# Patient Record
Sex: Female | Born: 1985 | Race: White | Hispanic: No | Marital: Single | Smoking: Current every day smoker
Health system: Southern US, Community
[De-identification: ages and names within clinical notes are randomized; demographics above are authoritative.]

## PROBLEM LIST (undated history)

## (undated) DIAGNOSIS — M797 Fibromyalgia: Secondary | ICD-10-CM

## (undated) DIAGNOSIS — F329 Major depressive disorder, single episode, unspecified: Secondary | ICD-10-CM

## (undated) DIAGNOSIS — F1920 Other psychoactive substance dependence, uncomplicated: Secondary | ICD-10-CM

## (undated) DIAGNOSIS — F1910 Other psychoactive substance abuse, uncomplicated: Secondary | ICD-10-CM

## (undated) DIAGNOSIS — K56609 Unspecified intestinal obstruction, unspecified as to partial versus complete obstruction: Secondary | ICD-10-CM

## (undated) DIAGNOSIS — N200 Calculus of kidney: Secondary | ICD-10-CM

## (undated) DIAGNOSIS — M5124 Other intervertebral disc displacement, thoracic region: Secondary | ICD-10-CM

## (undated) DIAGNOSIS — M5126 Other intervertebral disc displacement, lumbar region: Secondary | ICD-10-CM

## (undated) DIAGNOSIS — F32A Depression, unspecified: Secondary | ICD-10-CM

## (undated) DIAGNOSIS — M069 Rheumatoid arthritis, unspecified: Secondary | ICD-10-CM

## (undated) DIAGNOSIS — M810 Age-related osteoporosis without current pathological fracture: Secondary | ICD-10-CM

## (undated) DIAGNOSIS — K5792 Diverticulitis of intestine, part unspecified, without perforation or abscess without bleeding: Secondary | ICD-10-CM

## (undated) DIAGNOSIS — G40909 Epilepsy, unspecified, not intractable, without status epilepticus: Secondary | ICD-10-CM

## (undated) DIAGNOSIS — K2950 Unspecified chronic gastritis without bleeding: Secondary | ICD-10-CM

## (undated) DIAGNOSIS — G8929 Other chronic pain: Secondary | ICD-10-CM

## (undated) DIAGNOSIS — F1110 Opioid abuse, uncomplicated: Secondary | ICD-10-CM

## (undated) DIAGNOSIS — G4700 Insomnia, unspecified: Secondary | ICD-10-CM

## (undated) DIAGNOSIS — J302 Other seasonal allergic rhinitis: Secondary | ICD-10-CM

## (undated) DIAGNOSIS — J449 Chronic obstructive pulmonary disease, unspecified: Secondary | ICD-10-CM

## (undated) DIAGNOSIS — K219 Gastro-esophageal reflux disease without esophagitis: Secondary | ICD-10-CM

## (undated) DIAGNOSIS — R109 Unspecified abdominal pain: Secondary | ICD-10-CM

## (undated) DIAGNOSIS — F319 Bipolar disorder, unspecified: Secondary | ICD-10-CM

## (undated) DIAGNOSIS — F419 Anxiety disorder, unspecified: Secondary | ICD-10-CM

## (undated) DIAGNOSIS — K5989 Other specified functional intestinal disorders: Secondary | ICD-10-CM

## (undated) HISTORY — PX: CHOLECYSTECTOMY: SHX55

## (undated) HISTORY — PX: OTHER SURGICAL HISTORY: SHX169

## (undated) HISTORY — DX: Gastro-esophageal reflux disease without esophagitis: K21.9

## (undated) HISTORY — DX: Fibromyalgia: M79.7

## (undated) HISTORY — PX: APPENDECTOMY (OPEN): SHX54

## (undated) HISTORY — PX: OVARIAN CYST SURGERY: SHX726

## (undated) HISTORY — DX: Other specified functional intestinal disorders: K59.89

## (undated) HISTORY — DX: Other seasonal allergic rhinitis: J30.2

## (undated) HISTORY — DX: Other chronic pain: G89.29

## (undated) HISTORY — DX: Unspecified abdominal pain: R10.9

## (undated) HISTORY — DX: Anxiety disorder, unspecified: F41.9

## (undated) HISTORY — DX: Chronic obstructive pulmonary disease, unspecified: J44.9

## (undated) HISTORY — DX: Bipolar disorder, unspecified: F31.9

## (undated) HISTORY — DX: Insomnia, unspecified: G47.00

## (undated) HISTORY — PX: SALPINGECTOMY, OPEN: SHX5529

## (undated) HISTORY — DX: Opioid abuse, uncomplicated: F11.10

---

## 2007-10-22 ENCOUNTER — Emergency Department
Admission: EM | Admit: 2007-10-22 | Disposition: A | Discharge status: Home / Self Care | Payer: Self-pay | Source: Ambulatory Visit

## 2007-10-24 ENCOUNTER — Emergency Department
Admission: EM | Admit: 2007-10-24 | Discharge status: Against Medical Advice | Payer: Self-pay | Source: Ambulatory Visit

## 2007-10-29 ENCOUNTER — Emergency Department
Admission: EM | Admit: 2007-10-29 | Disposition: A | Discharge status: Home / Self Care | Payer: Self-pay | Source: Ambulatory Visit

## 2007-11-03 ENCOUNTER — Emergency Department
Admission: EM | Admit: 2007-11-03 | Disposition: A | Discharge status: Home / Self Care | Payer: Self-pay | Source: Ambulatory Visit

## 2007-11-18 ENCOUNTER — Ambulatory Visit
Admission: RE | Admit: 2007-11-18 | Disposition: A | Discharge status: Home / Self Care | Payer: Self-pay | Source: Ambulatory Visit

## 2007-11-21 ENCOUNTER — Ambulatory Visit
Admission: RE | Admit: 2007-11-21 | Disposition: A | Discharge status: Home / Self Care | Payer: Self-pay | Source: Ambulatory Visit

## 2007-12-06 ENCOUNTER — Emergency Department
Admission: EM | Admit: 2007-12-06 | Disposition: A | Discharge status: Home / Self Care | Payer: Self-pay | Source: Ambulatory Visit

## 2007-12-19 ENCOUNTER — Emergency Department
Admission: EM | Admit: 2007-12-19 | Discharge status: Against Medical Advice | Payer: Self-pay | Source: Ambulatory Visit

## 2007-12-23 ENCOUNTER — Ambulatory Visit
Admission: RE | Admit: 2007-12-23 | Disposition: A | Discharge status: Home / Self Care | Payer: Self-pay | Source: Ambulatory Visit

## 2008-01-31 ENCOUNTER — Ambulatory Visit
Admission: RE | Admit: 2008-01-31 | Disposition: A | Discharge status: Home / Self Care | Payer: Self-pay | Source: Ambulatory Visit

## 2008-02-03 ENCOUNTER — Emergency Department
Admission: EM | Admit: 2008-02-03 | Disposition: A | Discharge status: Home / Self Care | Payer: Self-pay | Source: Ambulatory Visit

## 2008-02-12 ENCOUNTER — Ambulatory Visit
Admission: RE | Admit: 2008-02-12 | Disposition: A | Discharge status: Home / Self Care | Payer: Self-pay | Source: Ambulatory Visit

## 2008-02-21 ENCOUNTER — Ambulatory Visit
Admission: RE | Admit: 2008-02-21 | Disposition: A | Discharge status: Home / Self Care | Payer: Self-pay | Source: Ambulatory Visit

## 2008-03-03 ENCOUNTER — Emergency Department
Admission: EM | Admit: 2008-03-03 | Disposition: A | Discharge status: Home / Self Care | Payer: Self-pay | Source: Ambulatory Visit

## 2008-03-22 ENCOUNTER — Emergency Department
Admission: EM | Admit: 2008-03-22 | Disposition: A | Discharge status: Home / Self Care | Payer: Self-pay | Source: Ambulatory Visit

## 2009-01-23 DIAGNOSIS — K56609 Unspecified intestinal obstruction, unspecified as to partial versus complete obstruction: Secondary | ICD-10-CM

## 2009-01-23 HISTORY — DX: Unspecified intestinal obstruction, unspecified as to partial versus complete obstruction: K56.609

## 2009-01-23 HISTORY — PX: ILEOSTOMY: SHX1783

## 2009-01-23 HISTORY — PX: LAPAROTOMY, COLECTOMY, TOTAL: SHX4601

## 2010-01-23 HISTORY — PX: ILEOSTOMY CLOSURE: SHX1784

## 2011-01-24 HISTORY — PX: OTHER SURGICAL HISTORY: SHX169

## 2011-12-25 ENCOUNTER — Ambulatory Visit: Payer: Self-pay | Admitting: Neurology

## 2011-12-25 LAB — CREATININE, SERUM
Creatinine: 0.53 mg/dL — ABNORMAL LOW (ref 0.60–1.30)
EGFR (African American): >60
EGFR (Non-African Amer.): >60

## 2012-01-24 HISTORY — PX: OTHER SURGICAL HISTORY: SHX169

## 2012-04-17 ENCOUNTER — Emergency Department: Payer: Self-pay | Admitting: Emergency Medicine

## 2012-04-17 LAB — URINALYSIS, COMPLETE
Blood: NEGATIVE
Glucose,UR: NEGATIVE mg/dL (ref 0–75)
Ketone: NEGATIVE
Nitrite: NEGATIVE
Ph: 7 (ref 4.5–8.0)
Specific Gravity: 1.018 (ref 1.003–1.030)
Squamous Epithelial: 8

## 2012-04-17 LAB — CBC WITH DIFFERENTIAL/PLATELET
Basophil %: 1 %
Eosinophil #: 0.2 10*3/uL (ref 0.0–0.7)
Eosinophil %: 1.9 %
HGB: 12.3 g/dL (ref 12.0–16.0)
Lymphocyte #: 2.9 10*3/uL (ref 1.0–3.6)
MCH: 30.3 pg (ref 26.0–34.0)
MCHC: 33.7 g/dL (ref 32.0–36.0)
Monocyte #: 0.9 x10 3/mm (ref 0.2–0.9)
Monocyte %: 8.7 %
Neutrophil #: 6.1 10*3/uL (ref 1.4–6.5)
Neutrophil %: 60 %
RDW: 17 % — ABNORMAL HIGH (ref 11.5–14.5)
WBC: 10.1 10*3/uL (ref 3.6–11.0)

## 2012-04-17 LAB — COMPREHENSIVE METABOLIC PANEL
Albumin: 3.6 g/dL (ref 3.4–5.0)
Anion Gap: 6 — ABNORMAL LOW (ref 7–16)
Calcium, Total: 8.3 mg/dL — ABNORMAL LOW (ref 8.5–10.1)
Co2: 28 mmol/L (ref 21–32)
EGFR (Non-African Amer.): >60
Osmolality: 281 (ref 275–301)
Potassium: 3.3 mmol/L — ABNORMAL LOW (ref 3.5–5.1)
SGOT(AST): 16 U/L (ref 15–37)
SGPT (ALT): 18 U/L (ref 12–78)
Total Protein: 7.6 g/dL (ref 6.4–8.2)

## 2012-04-19 ENCOUNTER — Emergency Department (HOSPITAL_COMMUNITY): Payer: Medicare Other

## 2012-04-19 ENCOUNTER — Inpatient Hospital Stay (HOSPITAL_COMMUNITY)
Admission: EM | Admit: 2012-04-19 | Discharge: 2012-04-20 | DRG: 918 | Disposition: A | Discharge status: Home / Self Care | Payer: Medicare Other | Attending: Internal Medicine | Admitting: Internal Medicine

## 2012-04-19 ENCOUNTER — Encounter (HOSPITAL_COMMUNITY): Payer: Self-pay | Admitting: Emergency Medicine

## 2012-04-19 DIAGNOSIS — T394X2A Poisoning by antirheumatics, not elsewhere classified, intentional self-harm, initial encounter: Secondary | ICD-10-CM | POA: Diagnosis present

## 2012-04-19 DIAGNOSIS — T424X4A Poisoning by benzodiazepines, undetermined, initial encounter: Secondary | ICD-10-CM | POA: Diagnosis present

## 2012-04-19 DIAGNOSIS — T400X1A Poisoning by opium, accidental (unintentional), initial encounter: Principal | ICD-10-CM | POA: Diagnosis present

## 2012-04-19 DIAGNOSIS — T43502A Poisoning by unspecified antipsychotics and neuroleptics, intentional self-harm, initial encounter: Secondary | ICD-10-CM | POA: Diagnosis present

## 2012-04-19 DIAGNOSIS — T398X2A Poisoning by other nonopioid analgesics and antipyretics, not elsewhere classified, intentional self-harm, initial encounter: Secondary | ICD-10-CM | POA: Diagnosis present

## 2012-04-19 DIAGNOSIS — Z79899 Other long term (current) drug therapy: Secondary | ICD-10-CM

## 2012-04-19 DIAGNOSIS — F1310 Sedative, hypnotic or anxiolytic abuse, uncomplicated: Secondary | ICD-10-CM

## 2012-04-19 DIAGNOSIS — F1910 Other psychoactive substance abuse, uncomplicated: Secondary | ICD-10-CM

## 2012-04-19 DIAGNOSIS — F329 Major depressive disorder, single episode, unspecified: Secondary | ICD-10-CM | POA: Diagnosis present

## 2012-04-19 DIAGNOSIS — F17200 Nicotine dependence, unspecified, uncomplicated: Secondary | ICD-10-CM | POA: Diagnosis present

## 2012-04-19 DIAGNOSIS — T438X2A Poisoning by other psychotropic drugs, intentional self-harm, initial encounter: Secondary | ICD-10-CM | POA: Diagnosis present

## 2012-04-19 DIAGNOSIS — F1110 Opioid abuse, uncomplicated: Secondary | ICD-10-CM

## 2012-04-19 DIAGNOSIS — F3289 Other specified depressive episodes: Secondary | ICD-10-CM | POA: Diagnosis present

## 2012-04-19 DIAGNOSIS — G40909 Epilepsy, unspecified, not intractable, without status epilepticus: Secondary | ICD-10-CM | POA: Diagnosis present

## 2012-04-19 DIAGNOSIS — T50901A Poisoning by unspecified drugs, medicaments and biological substances, accidental (unintentional), initial encounter: Secondary | ICD-10-CM

## 2012-04-19 DIAGNOSIS — F32A Depression, unspecified: Secondary | ICD-10-CM | POA: Diagnosis present

## 2012-04-19 DIAGNOSIS — R4182 Altered mental status, unspecified: Secondary | ICD-10-CM | POA: Diagnosis present

## 2012-04-19 HISTORY — DX: Epilepsy, unspecified, not intractable, without status epilepticus: G40.909

## 2012-04-19 HISTORY — DX: Depression, unspecified: F32.A

## 2012-04-19 HISTORY — DX: Other intervertebral disc displacement, thoracic region: M51.24

## 2012-04-19 HISTORY — DX: Unspecified intestinal obstruction, unspecified as to partial versus complete obstruction: K56.609

## 2012-04-19 HISTORY — DX: Other intervertebral disc displacement, lumbar region: M51.26

## 2012-04-19 HISTORY — DX: Major depressive disorder, single episode, unspecified: F32.9

## 2012-04-19 HISTORY — DX: Diverticulitis of intestine, part unspecified, without perforation or abscess without bleeding: K57.92

## 2012-04-19 LAB — RAPID URINE DRUG SCREEN, HOSP PERFORMED
Barbiturates: NOT DETECTED
Cocaine: NOT DETECTED

## 2012-04-19 LAB — URINALYSIS, ROUTINE W REFLEX MICROSCOPIC
Bilirubin Urine: NEGATIVE
Glucose, UA: NEGATIVE mg/dL
Ketones, ur: NEGATIVE mg/dL
Nitrite: NEGATIVE
Specific Gravity, Urine: 1.025 (ref 1.005–1.030)
pH: 6.5 (ref 5.0–8.0)

## 2012-04-19 LAB — URINE MICROSCOPIC-ADD ON

## 2012-04-19 LAB — COMPREHENSIVE METABOLIC PANEL
ALT: 12 U/L (ref 0–35)
Alkaline Phosphatase: 125 U/L — ABNORMAL HIGH (ref 39–117)
BUN: 6 mg/dL (ref 6–23)
CO2: 26 mEq/L (ref 19–32)
Chloride: 106 mEq/L (ref 96–112)
GFR calc Af Amer: >90 mL/min (ref >90)
Glucose, Bld: 132 mg/dL — ABNORMAL HIGH (ref 70–99)
Potassium: 3.3 mEq/L — ABNORMAL LOW (ref 3.5–5.1)
Sodium: 141 mEq/L (ref 135–145)
Total Bilirubin: 0.1 mg/dL — ABNORMAL LOW (ref 0.3–1.2)
Total Protein: 6.4 g/dL (ref 6.0–8.3)

## 2012-04-19 LAB — CBC WITH DIFFERENTIAL/PLATELET
Basophils Absolute: 0 10*3/uL (ref 0.0–0.1)
Basophils Relative: 0 % (ref 0–1)
Eosinophils Relative: 1 % (ref 0–5)
HCT: 32.6 % — ABNORMAL LOW (ref 36.0–46.0)
MCHC: 33.4 g/dL (ref 30.0–36.0)
Monocytes Absolute: 1.8 10*3/uL — ABNORMAL HIGH (ref 0.1–1.0)
Neutro Abs: 13.5 10*3/uL — ABNORMAL HIGH (ref 1.7–7.7)
Platelets: 355 10*3/uL (ref 150–400)
RDW: 16.1 % — ABNORMAL HIGH (ref 11.5–15.5)

## 2012-04-19 LAB — PREGNANCY, URINE: Preg Test, Ur: NEGATIVE

## 2012-04-19 LAB — PROTIME-INR: INR: 0.98 (ref 0.00–1.49)

## 2012-04-19 LAB — SALICYLATE LEVEL: Salicylate Lvl: <2 mg/dL — ABNORMAL LOW (ref 2.8–20.0)

## 2012-04-19 LAB — ETHANOL: Alcohol, Ethyl (B): <11 mg/dL (ref 0–11)

## 2012-04-19 MED ORDER — DEXTROSE 5 % IV SOLN
Freq: Once | INTRAVENOUS | Status: AC
  Administered 2012-04-19: 16:00:00 via INTRAVENOUS
  Filled 2012-04-19: qty 100

## 2012-04-19 MED ORDER — ACETYLCYSTEINE LOAD VIA INFUSION
150.0000 mg/kg | Freq: Once | INTRAVENOUS | Status: DC
  Filled 2012-04-19: qty 233

## 2012-04-19 MED ORDER — SODIUM CHLORIDE 0.9 % IJ SOLN
3.0000 mL | Freq: Two times a day (BID) | INTRAMUSCULAR | Status: DC
  Administered 2012-04-19 – 2012-04-20 (×2): 3 mL via INTRAVENOUS

## 2012-04-19 MED ORDER — ACETYLCYSTEINE LOAD VIA INFUSION
150.0000 mg/kg | Freq: Once | INTRAVENOUS | Status: DC
  Filled 2012-04-19 (×2): qty 230

## 2012-04-19 MED ORDER — ENOXAPARIN SODIUM 40 MG/0.4ML ~~LOC~~ SOLN
40.0000 mg | SUBCUTANEOUS | Status: DC
  Administered 2012-04-19: 40 mg via SUBCUTANEOUS
  Filled 2012-04-19 (×2): qty 0.4

## 2012-04-19 MED ORDER — POTASSIUM CHLORIDE 10 MEQ/100ML IV SOLN
10.0000 meq | INTRAVENOUS | Status: AC
  Filled 2012-04-19: qty 100

## 2012-04-19 MED ORDER — ONDANSETRON HCL 4 MG/2ML IJ SOLN
4.0000 mg | Freq: Once | INTRAMUSCULAR | Status: AC
  Administered 2012-04-19: 4 mg via INTRAVENOUS
  Filled 2012-04-19: qty 2

## 2012-04-19 MED ORDER — CAMPHOR-MENTHOL 0.5-0.5 % EX LOTN
TOPICAL_LOTION | CUTANEOUS | Status: DC | PRN
  Administered 2012-04-19: 18:00:00 via TOPICAL
  Filled 2012-04-19: qty 222

## 2012-04-19 MED ORDER — SODIUM CHLORIDE 0.9 % IV SOLN
INTRAVENOUS | Status: DC
  Administered 2012-04-19 (×2): via INTRAVENOUS

## 2012-04-19 MED ORDER — SODIUM CHLORIDE 0.9 % IV BOLUS (SEPSIS)
250.0000 mL | Freq: Once | INTRAVENOUS | Status: AC
  Administered 2012-04-19: 12:00:00 via INTRAVENOUS

## 2012-04-19 MED ORDER — ACETYLCYSTEINE 200 MG/ML IV SOLN
15.0000 mg/kg/h | INTRAVENOUS | Status: DC
  Administered 2012-04-19: 15 mg/kg/h via INTRAVENOUS
  Filled 2012-04-19: qty 200

## 2012-04-19 MED ORDER — SODIUM CHLORIDE 0.9 % IV SOLN
INTRAVENOUS | Status: DC
  Administered 2012-04-19: 1000 mL via INTRAVENOUS
  Administered 2012-04-20: 09:00:00 via INTRAVENOUS

## 2012-04-19 NOTE — ED Notes (Signed)
Report given to [facility]. Pt placed in Rm 15 on cardiac monitor and pulse ox. Parents at bedside.

## 2012-04-19 NOTE — ED Provider Notes (Signed)
History     CSN: [id number]  Arrival date & time 04/19/12  1111   First MD Initiated Contact with Patient 04/19/12 1141      Chief Complaint  Patient presents with  . Drug Overdose    (Consider location/radiation/quality/duration/timing/severity/associated sxs/prior treatment) Patient is a 27 y.o. female presenting with Overdose. The history is provided by the patient and a parent.  Drug Overdose Associated symptoms include abdominal pain. Pertinent negatives include no chest pain, no headaches and no shortness of breath.   Patient with long-standing history of substance abuse brought in by her mother. By history she claims that she took Seroquel Lamictal Prozac at about 9:00 last evening. Denies it being suicidal in intent. However patient has told multiple stories of about the time that she took the medications. Denies any alcohol or street drugs at this point in time. The patient is with slurred speech and drowsy but is responsive and will answer questions. Airway intact. Again denies it as a suicide intent. Past Medical History  Diagnosis Date  . H/O total colectomy   . SBO (small bowel obstruction)     No past surgical history on file.  No family history on file.  History  Substance Use Topics  . Smoking status: Not on file  . Smokeless tobacco: Not on file  . Alcohol Use: Not on file    OB History   Grav Para Term Preterm Abortions TAB SAB Ect Mult Living                  Review of Systems  Constitutional: Negative for fever.  HENT: Negative for congestion, trouble swallowing and neck pain.   Eyes: Negative for visual disturbance.  Respiratory: Negative for shortness of breath.   Cardiovascular: Negative for chest pain.  Gastrointestinal: Positive for abdominal pain.  Musculoskeletal: Negative for back pain.  Neurological: Positive for speech difficulty. Negative for facial asymmetry and headaches.  Hematological: Does not bruise/bleed easily.   Psychiatric/Behavioral: Positive for confusion. Negative for suicidal ideas.    Allergies  Adhesive; Fentanyl; Midodrine; Morphine and related; and Latex  Home Medications   Current Outpatient Rx  Name  Route  Sig  Dispense  Refill  . FLUoxetine (PROZAC) 20 MG tablet   Oral   Take 20 mg by mouth daily.         Marland Kitchen HYDROcodone-acetaminophen (NORCO) 10-325 MG per tablet   Oral   Take 1 tablet by mouth every 6 (six) hours as needed for pain.         . LamoTRIgine 200 MG TB24   Oral   Take 400 mg by mouth at bedtime.         . pregabalin (LYRICA) 150 MG capsule   Oral   Take 150 mg by mouth 2 (two) times daily.         . QUEtiapine (SEROQUEL) 400 MG tablet   Oral   Take 400 mg by mouth at bedtime.           BP 96/61  Pulse 110  Temp(Src) 98.3 F (36.8 C) (Oral)  SpO2 94%  LMP 04/11/2012  Physical Exam  Nursing note and vitals reviewed. Constitutional: She appears well-developed and well-nourished.  HENT:  Head: Normocephalic and atraumatic.  Mouth/Throat: Oropharynx is clear and moist.  Eyes: Conjunctivae and EOM are normal. Pupils are equal, round, and reactive to light.  Neck: Normal range of motion. Neck supple.  Cardiovascular: Normal rate, regular rhythm, normal heart sounds and intact distal pulses.  No murmur heard. Pulmonary/Chest: Effort normal and breath sounds normal. No stridor. No respiratory distress. She has no wheezes. She has no rales.  Abdominal: Soft. Bowel sounds are normal. There is no tenderness.  Musculoskeletal: Normal range of motion. She exhibits no edema.  Lymphadenopathy:    She has no cervical adenopathy.  Neurological: She is alert. No cranial nerve deficit. She exhibits normal muscle tone. Coordination normal.  Speech is slurred the patient is drowsy but arousable and will answer questions. Moving all 4 extremities we'll follow commands cranial nerves are normal and intact.  Skin: Skin is warm. No rash noted. She is not  diaphoretic. No erythema.    ED Course  Procedures (including critical care time)  Labs Reviewed  COMPREHENSIVE METABOLIC PANEL - Abnormal; Notable for the following:    Potassium 3.3 (*)    Glucose, Bld 132 (*)    Albumin 3.3 (*)    Alkaline Phosphatase 125 (*)    Total Bilirubin 0.1 (*)    All other components within normal limits  CBC WITH DIFFERENTIAL - Abnormal; Notable for the following:    WBC 17.1 (*)    RBC 3.63 (*)    Hemoglobin 10.9 (*)    HCT 32.6 (*)    RDW 16.1 (*)    Neutrophils Relative 79 (*)    Neutro Abs 13.5 (*)    Lymphocytes Relative 9 (*)    Monocytes Absolute 1.8 (*)    All other components within normal limits  ACETAMINOPHEN LEVEL - Abnormal; Notable for the following:    Acetaminophen (Tylenol), Serum 76.7 (*)    All other components within normal limits  SALICYLATE LEVEL - Abnormal; Notable for the following:    Salicylate Lvl <2.0 (*)    All other components within normal limits  LIPASE, BLOOD  ETHANOL  URINALYSIS, ROUTINE W REFLEX MICROSCOPIC  URINE RAPID DRUG SCREEN (HOSP PERFORMED)  PREGNANCY, URINE   Dg Chest Port 1 View  04/19/2012  *RADIOLOGY REPORT*  Clinical Data: Drug overdose  PORTABLE CHEST - 1 VIEW  Comparison: None.  Findings: The cardiac shadow is within normal limits.  Poor inspiratory effort is noted with crowding of vascular markings. Some very mild vascular congestion is noted.  IMPRESSION: Mild vascular congestion. Poor inspiratory effort.   Original Report Authenticated By: Alcide Clever, M.D.    Results for orders placed during the hospital encounter of 04/19/12  LIPASE, BLOOD      Result Value Range   Lipase 23  11 - 59 U/L  COMPREHENSIVE METABOLIC PANEL      Result Value Range   Sodium 141  135 - 145 mEq/L   Potassium 3.3 (*) 3.5 - 5.1 mEq/L   Chloride 106  96 - 112 mEq/L   CO2 26  19 - 32 mEq/L   Glucose, Bld 132 (*) 70 - 99 mg/dL   BUN 6  6 - 23 mg/dL   Creatinine, Ser 2.13  0.50 - 1.10 mg/dL   Calcium 8.5  8.4 -  08.6 mg/dL   Total Protein 6.4  6.0 - 8.3 g/dL   Albumin 3.3 (*) 3.5 - 5.2 g/dL   AST 28  0 - 37 U/L   ALT 12  0 - 35 U/L   Alkaline Phosphatase 125 (*) 39 - 117 U/L   Total Bilirubin 0.1 (*) 0.3 - 1.2 mg/dL   GFR calc non Af Amer >90  >90 mL/min   GFR calc Af Amer >90  >90 mL/min  CBC WITH DIFFERENTIAL  Result Value Range   WBC 17.1 (*) 4.0 - 10.5 K/uL   RBC 3.63 (*) 3.87 - 5.11 MIL/uL   Hemoglobin 10.9 (*) 12.0 - 15.0 g/dL   HCT 14.7 (*) 82.9 - 56.2 %   MCV 89.8  78.0 - 100.0 fL   MCH 30.0  26.0 - 34.0 pg   MCHC 33.4  30.0 - 36.0 g/dL   RDW 13.0 (*) 86.5 - 78.4 %   Platelets 355  150 - 400 K/uL   Neutrophils Relative 79 (*) 43 - 77 %   Neutro Abs 13.5 (*) 1.7 - 7.7 K/uL   Lymphocytes Relative 9 (*) 12 - 46 %   Lymphs Abs 1.6  0.7 - 4.0 K/uL   Monocytes Relative 11  3 - 12 %   Monocytes Absolute 1.8 (*) 0.1 - 1.0 K/uL   Eosinophils Relative 1  0 - 5 %   Eosinophils Absolute 0.2  0.0 - 0.7 K/uL   Basophils Relative 0  0 - 1 %   Basophils Absolute 0.0  0.0 - 0.1 K/uL  ETHANOL      Result Value Range   Alcohol, Ethyl (B) <11  0 - 11 mg/dL  ACETAMINOPHEN LEVEL      Result Value Range   Acetaminophen (Tylenol), Serum 76.7 (*) 10 - 30 ug/mL  SALICYLATE LEVEL      Result Value Range   Salicylate Lvl <2.0 (*) 2.8 - 20.0 mg/dL    Date: 69/62/9528  Rate: 88  Rhythm: normal sinus rhythm  QRS Axis: normal  Intervals: normal  ST/T Wave abnormalities: normal  Conduction Disutrbances:none  Narrative Interpretation:   Old EKG Reviewed: none available    1. Overdose by acetaminophen, initial encounter   2. Overdose drug, initial encounter   3. Polysubstance abuse     CRITICAL CARE Performed by: Shelda Jakes.   Total critical care time: 30  Critical care time was exclusive of separately billable procedures and treating other patients.  Critical care was necessary to treat or prevent imminent or life-threatening deterioration.  Critical care was time spent  personally by me on the following activities: development of treatment plan with patient and/or surrogate as well as nursing, discussions with consultants, evaluation of patient's response to treatment, examination of patient, obtaining history from patient or surrogate, ordering and performing treatments and interventions, ordering and review of laboratory studies, ordering and review of radiographic studies, pulse oximetry and re-evaluation of patient's condition.   MDM   Patient very poor historian in regarding to when she took medications and what she took. It seems as if she at least admits to taking Seroquel Lamictal Prozac but claims she hasn't taken any since not clock last night and this is highly unlikely she still is very slurred speech. Liver function tests were normal however Tylenol level was elevated to timing of this Tylenol overdose is not clear this could be delayed or missed overdose although the current level is not particularly toxic until we know whether it is going up or down will initiate the acetyl cysteine IV protocol for Tylenol overdose. Not able to give reliable information from the patient on when she took medication she denies that she taking Tylenol at all but obviously she has. Discussed with pulmonary critical care they feel she can be a step down admission by regular internal medicine at this point in time I do have a call out to them already. Patient's blood pressure is normal she does have slurred speech she is drowsy  but she is maintaining her airway. No need for intubation at this point in time and as mentioned the liver function tests are normal so no evidence of significant liver toxicity at this point in time.        Shelda Jakes, MD 04/19/12 [phone]

## 2012-04-19 NOTE — Consult Note (Signed)
Reason for Consult: depression and intentional overdose on medication Referring Physician: Dr. Dolly Walker is an 27 y.o. female.  HPI: Patient has been suffering with multiple medical problems, emotional problems and substannce abuse verses dependence. Patient is a unreliable historian. She minimizes her emotional problem to depression and insomnia and substance issue to as a history of several years ago. She was oversedated when her mother seeking emergency medical attention for her drowsiness due to inappropriate intension overdose of prescribed medication. She has denied overdose but her tylenol levels are higher, she has been changing her story from person to person. Patient stated that her mother supporting her younger brother and his wife who told that she needs long term drug rehab services and she is willing to obtain because she was not allowed her mothers's home with drug abuse. Patient has been taking  Psychiatric medication from her PCP in [place]. She denied symptoms of depression, anxiety, mania and psychosis.  MSE: Patient is calm and cooperative. She is awake, alert and oriented x 4. She has been in phone conversation with mother and father from ER bed without distress. She has fine mood and affect. She has normal speech and thoughts. She has denied SI/HI and no evidence of psychosis.  Past Medical History  Diagnosis Date  . SBO (small bowel obstruction) 2011    "scar tissue rupture on my intestines"   . Diverticulitis   . Depression     Per pt, no history of suicidal ideation  . Seizure disorder     Last seizure in 2012  . Lumbar herniated disc     per patient  . 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 Total colectomy      Done in [place]    No family history on file.  Social History:  reports that she has been smoking.  She does not have any smokeless tobacco history on file.  She reports that she does not drink alcohol or use illicit drugs.  Allergies:  Allergies  Allergen Reactions  . Adhesive (Tape) Itching  . Fentanyl Other (See Comments)    REACTION:  unknown  . Midodrine Other (See Comments)    REACTION:  unknown  . Morphine And Related Other (See Comments)    REACTION:  unknown  . Latex Itching and Rash    Medications: I have reviewed the patient's current medications.  Results for orders placed during the hospital encounter of 04/19/12 (from the past 48 hour(s))  LIPASE, BLOOD     Status: None   Collection Time    04/19/12 11:57 AM      Result Value Range   Lipase 23  11 - 59 U/L  COMPREHENSIVE METABOLIC PANEL     Status: Abnormal   Collection Time    04/19/12 11:57 AM      Result Value Range   Sodium 141  135 - 145 mEq/L   Potassium 3.3 (*) 3.5 - 5.1 mEq/L   Chloride 106  96 - 112 mEq/L   CO2 26  19 - 32 mEq/L   Glucose, Bld 132 (*) 70 - 99 mg/dL   BUN 6  6 - 23 mg/dL   Creatinine, Ser 0.86  0.50 - 1.10 mg/dL   Calcium 8.5  8.4 - 57.8 mg/dL   Total Protein 6.4  6.0 - 8.3 g/dL   Albumin 3.3 (*) 3.5 - 5.2 g/dL   AST  28  0 - 37 U/L   ALT 12  0 - 35 U/L   Alkaline Phosphatase 125 (*) 39 - 117 U/L   Total Bilirubin 0.1 (*) 0.3 - 1.2 mg/dL   GFR calc non Af Amer >90  >90 mL/min   GFR calc Af Amer >90  >90 mL/min   Comment:            The eGFR has been calculated     using the CKD EPI equation.     This calculation has not been     validated in all clinical     situations.     eGFR's persistently     <90 mL/min signify     possible Chronic Kidney Disease.  CBC WITH DIFFERENTIAL     Status: Abnormal   Collection Time    04/19/12 11:57 AM      Result Value Range   WBC 17.1 (*) 4.0 - 10.5 K/uL   RBC 3.63 (*) 3.87 - 5.11 MIL/uL   Hemoglobin 10.9 (*) 12.0 - 15.0 g/dL   HCT 21.3 (*) 08.6 - 57.8 %   MCV 89.8  78.0 - 100.0 fL   MCH 30.0  26.0 - 34.0 pg   MCHC 33.4  30.0 - 36.0 g/dL   RDW 46.9 (*) 62.9 - 52.8 %   Platelets 355  150  - 400 K/uL   Neutrophils Relative 79 (*) 43 - 77 %   Neutro Abs 13.5 (*) 1.7 - 7.7 K/uL   Lymphocytes Relative 9 (*) 12 - 46 %   Lymphs Abs 1.6  0.7 - 4.0 K/uL   Monocytes Relative 11  3 - 12 %   Monocytes Absolute 1.8 (*) 0.1 - 1.0 K/uL   Eosinophils Relative 1  0 - 5 %   Eosinophils Absolute 0.2  0.0 - 0.7 K/uL   Basophils Relative 0  0 - 1 %   Basophils Absolute 0.0  0.0 - 0.1 K/uL  ETHANOL     Status: None   Collection Time    04/19/12 11:57 AM      Result Value Range   Alcohol, Ethyl (B) <11  0 - 11 mg/dL   Comment:            LOWEST DETECTABLE LIMIT FOR     SERUM ALCOHOL IS 11 mg/dL     FOR MEDICAL PURPOSES ONLY  ACETAMINOPHEN LEVEL     Status: Abnormal   Collection Time    04/19/12 11:57 AM      Result Value Range   Acetaminophen (Tylenol), Serum 76.7 (*) 10 - 30 ug/mL   Comment:            THERAPEUTIC CONCENTRATIONS VARY     SIGNIFICANTLY. A RANGE OF 10-30     ug/mL MAY BE AN EFFECTIVE     CONCENTRATION FOR MANY PATIENTS.     HOWEVER, SOME ARE BEST TREATED     AT CONCENTRATIONS OUTSIDE THIS     RANGE.     ACETAMINOPHEN CONCENTRATIONS     >150 ug/mL AT 4 HOURS AFTER     INGESTION AND >50 ug/mL AT 12     HOURS AFTER INGESTION ARE     OFTEN ASSOCIATED WITH TOXIC     REACTIONS.  SALICYLATE LEVEL     Status: Abnormal   Collection Time    04/19/12 11:57 AM      Result Value Range   Salicylate Lvl <2.0 (*) 2.8 - 20.0 mg/dL  PROTIME-INR  Status: None   Collection Time    04/19/12 11:57 AM      Result Value Range   Prothrombin Time 12.9  11.6 - 15.2 seconds   INR 0.98  0.00 - 1.49  URINALYSIS, ROUTINE W REFLEX MICROSCOPIC     Status: Abnormal   Collection Time    04/19/12  3:36 PM      Result Value Range   Color, Urine YELLOW  YELLOW   APPearance CLEAR  CLEAR   Specific Gravity, Urine 1.025  1.005 - 1.030   pH 6.5  5.0 - 8.0   Glucose, UA NEGATIVE  NEGATIVE mg/dL   Hgb urine dipstick SMALL (*) NEGATIVE   Bilirubin Urine NEGATIVE  NEGATIVE   Ketones, ur  NEGATIVE  NEGATIVE mg/dL   Protein, ur NEGATIVE  NEGATIVE mg/dL   Urobilinogen, UA 1.0  0.0 - 1.0 mg/dL   Nitrite NEGATIVE  NEGATIVE   Leukocytes, UA NEGATIVE  NEGATIVE  URINE RAPID DRUG SCREEN (HOSP PERFORMED)     Status: Abnormal   Collection Time    04/19/12  3:36 PM      Result Value Range   Opiates POSITIVE (*) NONE DETECTED   Cocaine NONE DETECTED  NONE DETECTED   Benzodiazepines POSITIVE (*) NONE DETECTED   Amphetamines NONE DETECTED  NONE DETECTED   Tetrahydrocannabinol NONE DETECTED  NONE DETECTED   Barbiturates NONE DETECTED  NONE DETECTED   Comment:            DRUG SCREEN FOR MEDICAL PURPOSES     ONLY.  IF CONFIRMATION IS NEEDED     FOR ANY PURPOSE, NOTIFY LAB     WITHIN 5 DAYS.                LOWEST DETECTABLE LIMITS     FOR URINE DRUG SCREEN     Drug Class       Cutoff (ng/mL)     Amphetamine      1000     Barbiturate      200     Benzodiazepine   200     Tricyclics       300     Opiates          300     Cocaine          300     THC              50  PREGNANCY, URINE     Status: None   Collection Time    04/19/12  3:36 PM      Result Value Range   Preg Test, Ur NEGATIVE  NEGATIVE   Comment:            THE SENSITIVITY OF THIS     METHODOLOGY IS >20 mIU/mL.  URINE MICROSCOPIC-ADD ON     Status: Abnormal   Collection Time    04/19/12  3:36 PM      Result Value Range   Squamous Epithelial / LPF FEW (*) RARE   WBC, UA 0-2  <3 WBC/hpf   RBC / HPF 7-10  <3 RBC/hpf   Bacteria, UA RARE  RARE   Casts HYALINE CASTS (*) NEGATIVE   Urine-Other MUCOUS PRESENT      Dg Chest Port 1 View  04/19/2012  *RADIOLOGY REPORT*  Clinical Data: Drug overdose  PORTABLE CHEST - 1 VIEW  Comparison: None.  Findings: The cardiac shadow is within normal limits.  Poor inspiratory effort is noted with crowding of  vascular markings. Some very mild vascular congestion is noted.  IMPRESSION: Mild vascular congestion. Poor inspiratory effort.   Original Report Authenticated By: Alcide Clever,  M.D.     Positive for behavior problems, bipolar, illegal drug usage, mood swings and sleep disturbance Blood pressure 108/72, pulse 95, temperature 98 F (36.7 C), temperature source Oral, resp. rate 16, weight 137 lb (62.143 kg), last menstrual period 04/11/2012, SpO2 94.00%.   Assessment/Plan: Depression UDS is positive for opioids and benzodiazepines S/P Intentional overdose of medication  Recommendation: Patient does not meet criteria for acute psychiatric treatment. She will continue her current medication management and refer for inpatient substance abuse rehab treatment program near [place]. Patient is psychiatrically stable and can be discharged upon medically cleared.   Sally Walker,Sally Walker. 04/19/2012, 6:07 PM

## 2012-04-19 NOTE — H&P (Signed)
Date: 04/19/2012               Patient Name:  Sally Walker MRN: [id number]  DOB: January 25, 1985 Age / Sex: 27 y.o., female   PCP: No primary provider on file.              Medical Service: Internal Medicine Teaching Service              Attending Physician: Dr. Shelda Jakes, MD    First Contact: Dr. Elenor Legato Pager: [phone]  Second Contact: Dr. Stacy Gardner Pager: [phone]            After Hours (After 5p/  First Contact Pager: [phone]  weekends / holidays): Second Contact Pager: [phone]     Chief Complaint: Drowsiness  History of Present Illness: Patient is a 28 y.o. female with a PMHx of depression, recurrent diverticulitis s/p total colectomy, who presents to [facility] for evaluation of drowsiness. The patient states that her mom insisted she come to the ED for evaluation and admission after she was noted at home to be very drowsy, and had dropped several of her Norco pills that were discovered by her nephews at home. The patient denies having taken any of her Norco for the last 1-1/2 weeks, and denies taking any more of her medications than usual, however she does admit that she stopped taking her Seroquel for the last 2 weeks, and just resumed taking it last night. She states she's been taking her Lyrica, Lamictal, Prozac as prescribed. She admits to taking 4 Tylenol tablets 2 days ago, but denies taking any Tylenol since that time. She admits to a past history of methamphetamine and crack cocaine abuse, but denies any recent drug use. She denies any alcohol use. She admits to smoking one pack per day.  The patient denies any history of suicidal attempts, and denies any intentional overdose of her medication at this time. She admits to intentionally taking too many Norco approximately 2 weeks ago, and states she took 12-16 tablets at that time due to recalcitrant back pain, but denies any intention of self-harm or of trying to end her life. She states she's otherwise been  feeling well prior to her presentation today. She has a history of seizure disorder, and although she admits to dizziness last night which was consistent with her usual seizure auras, she denies any actual seizures last night, and states her last seizure was approximately 2 years ago. She states she's otherwise been feeling well prior to admission, and has no complaints.  Review of Systems: Per HPI.   Current Outpatient Medications: No current facility-administered medications on file prior to encounter.   No current outpatient prescriptions on file prior to encounter.    Allergies: Allergies  Allergen Reactions  . Adhesive (Tape) Itching  . Fentanyl Other (See Comments)    REACTION:  unknown  . Midodrine Other (See Comments)    REACTION:  unknown  . Morphine And Related Other (See Comments)    REACTION:  unknown  . Latex Itching and Rash     Past Medical History: Past Medical History  Diagnosis Date  . SBO (small bowel obstruction) 2011    "scar tissue rupture on my intestines"   . Diverticulitis   . Depression     Per pt, no history of suicidal ideation  . Seizure disorder     Last seizure in 2012  . Lumbar herniated disc     per patient  . 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 Total colectomy      Done in [place]    Family History: No family history on file.  Social History: History   Social History  . Marital Status: Single    Spouse Name: N/A    Number of Children: N/A  . Years of Education: N/A   Occupational History  . Not on file.   Social History Main Topics  . Smoking status: Current Every Day Smoker -- 1.00 packs/day for 14 years  . Smokeless tobacco: Not on file  . Alcohol Use: No  . Drug Use: No     Comment: Stopped Crystal Meth/Crack in 2007  . Sexually Active: No   Other Topics Concern  . Not on file   Social History Narrative  . No  narrative on file     Vital Signs: Blood pressure 96/61, pulse 88, temperature 98.3 F (36.8 C), temperature source Oral, resp. rate 14, weight 137 lb (62.143 kg), last menstrual period 04/11/2012, SpO2 98.00%.  Physical Exam: General: Vital signs reviewed and noted. Somnolent.   Head: Normocephalic, atraumatic.  Eyes: PERRL, EOMI, No signs of anemia or jaundice.  Nose: Mucous membranes moist, not inflammed, nonerythematous.  Throat: Oropharynx nonerythematous, no exudate appreciated.   Neck: No deformities, masses, or tenderness noted.  Lungs:  Normal respiratory effort. Clear to auscultation BL without crackles or wheezes.  Heart: RRR. S1 and S2 normal without gallop, murmur, or rubs.  Abdomen:  BS normoactive. Soft, Nondistended, non-tender. Multiple abdominal surgical scars.   Extremities: No pretibial edema.  Neurologic: A&O X3, CN II - XII are grossly intact. Motor strength is 5/5 in the all 4 extremities, Sensations intact to light touch, cerebellar signs negative.  Skin: No visible rashes, scars.   Lab results: Comprehensive Metabolic Panel:    Component Value Date/Time   NA 141 04/19/2012 1157   K 3.3* 04/19/2012 1157   CL 106 04/19/2012 1157   CO2 26 04/19/2012 1157   BUN 6 04/19/2012 1157   CREATININE 0.67 04/19/2012 1157   GLUCOSE 132* 04/19/2012 1157   CALCIUM 8.5 04/19/2012 1157   AST 28 04/19/2012 1157   ALT 12 04/19/2012 1157   ALKPHOS 125* 04/19/2012 1157   BILITOT 0.1* 04/19/2012 1157   PROT 6.4 04/19/2012 1157   ALBUMIN 3.3* 04/19/2012 1157    CBC:    Component Value Date/Time   WBC 17.1* 04/19/2012 1157   HGB 10.9* 04/19/2012 1157   HCT 32.6* 04/19/2012 1157   PLT 355 04/19/2012 1157   MCV 89.8 04/19/2012 1157   NEUTROABS 13.5* 04/19/2012 1157   LYMPHSABS 1.6 04/19/2012 1157   MONOABS 1.8* 04/19/2012 1157   EOSABS 0.2 04/19/2012 1157   BASOSABS 0.0 04/19/2012 1157    Imaging results:  Dg Chest Port 1 View  04/19/2012  *RADIOLOGY REPORT*  Clinical Data: Drug  overdose  PORTABLE CHEST - 1 VIEW  Comparison: None.  Findings: The cardiac shadow is within normal limits.  Poor inspiratory effort is noted with crowding of vascular markings. Some very mild vascular congestion is noted.  IMPRESSION: Mild vascular congestion. Poor inspiratory effort.   Original Report Authenticated By: Alcide Clever, M.D.      Other results:  EKG (04/19/2012) - Normal Sinus Rhythm, regular rate of approximately 88 bpm, normal axis, ST segments: normal.     Assessment & Plan:  Pt is a 27 y.o. yo  female with a PMHx of depression, recurrent diverticulitis s/p total colectomy, who was admitted on 04/19/2012 with symptoms of drowsiness, which was determined to be secondary to drug overdose.  Drug overdose - the patient's mother was not present at time of admission, and the patient denies overdosing on her medications, either intentionally or unintentionally. There are a number of inconsistencies with her story, however, particularly regarding her Tylenol ingestion history, which is contradicted by her positive acetaminophen level of 76. She will be admitted for further observation at this time, and psychiatry will be consulted for further evaluation and management. Given she states that she has not taken any Tylenol and >24 hours, and her acetaminophen level is 76, we will continue the N-acetylcysteine started in the ED, and will monitor LFTs (LFTs within normal limits on admission, with the exception of a mildly elevated alkaline phosphatase). - Admit to floor -Monitor vitals -Continue IV acetylcysteine -Trend acetaminophen levels -Recheck LFTs in a.m. -Check UDS - Consult psychiatry  Depression - will hold all antidepressant medications at this time, given the patient's altered mental status and likely drug overdose.   DVT PPX - lovenox  CODE STATUS - full  CONSULTS PLACED - N/A  DISPO - Disposition is deferred at this time, awaiting improvement of current medical  problems.   Anticipated discharge in approximately 1-2 day(s).   The patient does have a current PCP (No primary provider on file.) and does not need an [facility] hospital follow-up appointment after discharge.    Is the [facility] hospital follow-up appointment a one-time only appointment? not applicable.  Does the patient have transportation limitations that hinder transportation to clinic appointments? no   SERVICE NEEDED AT DISCHARGE - TO BE DETERMINED DURING HOSPITAL COURSE         Y = Yes, Blank = No PT:   OT:   RN:   Equipment:   Other:     Signed: Elfredia Nevins, MD  PGY-1, Internal Medicine Resident Pager: [phone]) 04/19/2012, 3:14 PM

## 2012-04-19 NOTE — Progress Notes (Signed)
MEDICATION RELATED CONSULT NOTE - INITIAL   Pharmacy Consult for acetadote Indication:   Allergies  Allergen Reactions  . Adhesive (Tape) Itching  . Fentanyl Other (See Comments)    REACTION:  unknown  . Midodrine Other (See Comments)    REACTION:  unknown  . Morphine And Related Other (See Comments)    REACTION:  unknown  . Latex Itching and Rash    Patient Measurements: Weight: 137 lb (62.143 kg)   Vital Signs: Temp: 98 F (36.7 C) (03/28 1523) Temp src: Oral (03/28 1523) BP: 108/72 mmHg (03/28 1523) Pulse Rate: 95 (03/28 1523) Intake/Output from previous day:   Intake/Output from this shift:    Labs:  Recent Labs  04/19/12 1157  WBC 17.1*  HGB 10.9*  HCT 32.6*  PLT 355  CREATININE 0.67  ALBUMIN 3.3*  PROT 6.4  AST 28  ALT 12  ALKPHOS 125*  BILITOT 0.1*   CrCl is unknown because there is no height on file for the current visit.   Microbiology: No results found for this or any previous visit (from the past 720 hour(s)).  Medical History: Past Medical History  Diagnosis Date  . SBO (small bowel obstruction) 2011    "scar tissue rupture on my intestines"   . Diverticulitis   . Depression     Per pt, no history of suicidal ideation  . Seizure disorder     Last seizure in 2012  . Lumbar herniated disc     per patient  . Herniated thoracic disc without myelopathy     per patient    Medications:   (Not in a hospital admission) Scheduled:    Assessment: 27 yo who presented to the ED for drowsiness. Pt's nephew found several norco at the house. She denied taking norco in the past week but admitted that she took 4 tylenol 2 days ago. She apparently took about 12-16 norco about two weeks ago for back pain. Her story is a little inconsistent as her APAP level is 76 in the ED. Her liver enzymes are normal and INR is pending. I've called the [facility] control to report her case. She will be placed on IV acetadote.   Spoke with Angelique Blonder at [facility]  center at [phone] 04/19/12.   Plan:  Acetadote bolus 150mg /kg x1 then 15mg /kg/hr Repeat APAP level in AM Cmet and INR qday x3 Update poison control with case in AM  Ulyses Southward Leigh 04/19/2012,4:27 PM

## 2012-04-19 NOTE — ED Notes (Signed)
Pt unable to void 

## 2012-04-19 NOTE — ED Notes (Addendum)
Pt here for detox. Appears extremely lethargic. States took [employer] 600mg , Limictal 300mg . Prozac 20 mg, Lamictal 400mg  last night at 2100. Possible unintentional overdose.

## 2012-04-19 NOTE — ED Notes (Signed)
Reports to ED via home with partents.  PT  Took Seraquil, 300mg  Limictal, Prozac 20mg , Lamivtal 400mg ..  Unintentional overdose, pt denies attempting to hurt self but admits to substance abuse hx.  Pt is extremely lathargic and having difficulty staying asleep.  Pt family at bedside, mother telling pt that if she doesn't get clean and stay clean she will be out of the home.

## 2012-04-20 ENCOUNTER — Encounter (HOSPITAL_COMMUNITY): Payer: Self-pay | Admitting: *Deleted

## 2012-04-20 DIAGNOSIS — F329 Major depressive disorder, single episode, unspecified: Secondary | ICD-10-CM

## 2012-04-20 DIAGNOSIS — T391X1A Poisoning by 4-Aminophenol derivatives, accidental (unintentional), initial encounter: Secondary | ICD-10-CM

## 2012-04-20 DIAGNOSIS — F3289 Other specified depressive episodes: Secondary | ICD-10-CM

## 2012-04-20 LAB — ACETAMINOPHEN LEVEL: Acetaminophen (Tylenol), Serum: <15 ug/mL (ref 10–30)

## 2012-04-20 LAB — COMPREHENSIVE METABOLIC PANEL
BUN: 3 mg/dL — ABNORMAL LOW (ref 6–23)
Calcium: 8.1 mg/dL — ABNORMAL LOW (ref 8.4–10.5)
Creatinine, Ser: 0.52 mg/dL (ref 0.50–1.10)
GFR calc Af Amer: >90 mL/min (ref >90)
Glucose, Bld: 97 mg/dL (ref 70–99)
Sodium: 141 mEq/L (ref 135–145)
Total Protein: 5.6 g/dL — ABNORMAL LOW (ref 6.0–8.3)

## 2012-04-20 LAB — CBC
Platelets: 291 10*3/uL (ref 150–400)
RBC: 3.29 MIL/uL — ABNORMAL LOW (ref 3.87–5.11)
RDW: 16.3 % — ABNORMAL HIGH (ref 11.5–15.5)
WBC: 8.3 10*3/uL (ref 4.0–10.5)

## 2012-04-20 LAB — PROTIME-INR: Prothrombin Time: 14.3 seconds (ref 11.6–15.2)

## 2012-04-20 MED ORDER — POTASSIUM CHLORIDE CRYS ER 20 MEQ PO TBCR
40.0000 meq | EXTENDED_RELEASE_TABLET | Freq: Once | ORAL | Status: AC
  Administered 2012-04-20: 40 meq via ORAL
  Filled 2012-04-20: qty 2

## 2012-04-20 MED ORDER — OXYCODONE HCL 5 MG PO TABS
5.0000 mg | ORAL_TABLET | Freq: Once | ORAL | Status: AC
  Administered 2012-04-20: 5 mg via ORAL
  Filled 2012-04-20: qty 1

## 2012-04-20 MED ORDER — POTASSIUM CHLORIDE CRYS ER 20 MEQ PO TBCR
40.0000 meq | EXTENDED_RELEASE_TABLET | ORAL | Status: AC
  Administered 2012-04-20 (×2): 40 meq via ORAL
  Filled 2012-04-20 (×2): qty 2

## 2012-04-20 NOTE — H&P (Signed)
Internal Medicine Teaching Service Attending Note Date: 04/20/2012  Patient name: Sally Walker  Medical record number: [id number]  Date of birth: 1985-03-05   I have read the documentation on this case by Dr. Lavena Bullion and agree with it with the following additions/observations:   This 27 year old lady with many psychological problems, is new to this hospital. She has been admitted while she had altered mental status with stated overdose of her lamictal, seroquel, lyrica, prozac, norco and other medicines which no one was sure about. The patient at that time of admission was incoherent, and very somnolent. Today she is wide awake, and tells that she took 2 norco pills and 2 tylenol for her back pain, and had taken her other medications per her prescriptions. She asserted that she has never been suicidal and her mom does not want her in the house so she brought her to the hospital. She has been a user (marijuana, crack cocaine) in the past, but has been clean in the past 7 years. She has recently been started on Temazepam by her doctor which she had also taken prior to being brought to the ED.  She has past medical history of depression & seizures and other psychiatric disorders that she does not know of. Her family doctor is Dr. Letitia Caul at [address], [place], [place] Ph: [phone].   Filed Vitals:   04/19/12 2023 04/20/12 0022 04/20/12 0403 04/20/12 0756  BP:  125/78 123/75 129/77  Pulse:  90 89 77  Temp:  97.9 F (36.6 C) 98.4 F (36.9 C) 98 F (36.7 C)  TempSrc:  Oral Oral Oral  Resp:  12 14 16   Height: 5\' 6"  (1.676 m)     Weight: 148 lb 13 oz (67.5 kg)     SpO2:  98% 96% 97%   Vitals reviewed.  General: Resting in bed. Thin built.  HEENT: PERRL, EOMI, no scleral icterus. Heart: RRR, no rubs, murmurs or gallops. Lungs: Clear to auscultation bilaterally, no wheezes, rales, or rhonchi. Abdomen: Soft, nontender, nondistended, BS present. Multiple scars on  belly. Extremities: Warm, no pedal edema. Neuro: Alert and oriented X3, cranial nerves II-XII grossly intact,  strength and sensation to light touch equal in bilateral upper and lower extremities Psych: Normal affect, normal judgement, normal mood. Oriented times 3.  Labs, EKG and imaging reviewed.    Recent Labs Lab 04/19/12 1157 04/20/12 0600  NA 141 141  K 3.3* 3.2*  CL 106 108  CO2 26 23  GLUCOSE 132* 97  BUN 6 3*  CREATININE 0.67 0.52  CALCIUM 8.5 8.1*    Recent Labs Lab 04/19/12 1157 04/20/12 0600  AST 28 18  ALT 12 10  ALKPHOS 125* 111  BILITOT 0.1* 0.2*  PROT 6.4 5.6*  ALBUMIN 3.3* 2.7*  INR 0.98 1.13   Drugs of Abuse     Component Value Date/Time   LABOPIA POSITIVE* 04/19/2012 1536   COCAINSCRNUR NONE DETECTED 04/19/2012 1536   LABBENZ POSITIVE* 04/19/2012 1536   AMPHETMU NONE DETECTED 04/19/2012 1536   THCU NONE DETECTED 04/19/2012 1536   LABBARB NONE DETECTED 04/19/2012 1536     Assessment and Plan   Overdose of Tylenol and hydrocodone in the setting of already prescribed medications like seroquel, pregabalin, and temazepam that made her drowsy. I would discontinue Temazepam upon discharge and ask the patient to be cautious while taking norco while already on so many CNS depressing meds.  Tylenol levels are down from 76 to normal. There  has been no change in liver enzyme. We had started her on IV acetylcysteine as a caution for tylenol poisoning, but per labs and history, it does not seem to be a concern any more. We will discontinue treatment at this time and check her comprehensive metabolic profile as outpatient. It will also check for hypokalemia which has been repleated this admission.   Rest per resident note.  Humbert Morozov 04/20/2012, 10:37 AM.

## 2012-04-20 NOTE — Clinical Social Work Note (Signed)
CSW met with patient. Stating she is open to going to residential drug treatment. Made referral to [facility] in [place]. [facility] stating patient medical needs too complex for her to be admitted there upon discharge. Patient given information on substance abuse programs and shelters in [place] and [place].  [place], [place] [phone] (weekend)

## 2012-04-20 NOTE — Progress Notes (Signed)
Subjective:    Patient is more alert this AM. After discussing her UDS results with her, she now admits to taking norco on the morning of admission, which she had adamantly denied yesterday. She also admits to taking temazepam recently, which she had not mentioned on admission despite extensive questioning regarding what medications she had taken.   Interval Events: No acute events.    Objective:    Vital Signs:   Temp:  [97.9 F (36.6 C)-98.4 F (36.9 C)] 98 F (36.7 C) (03/29 0756) Pulse Rate:  [77-110] 77 (03/29 0756) Resp:  [12-19] 16 (03/29 0756) BP: (96-137)/(61-89) 129/77 mmHg (03/29 0756) SpO2:  [94 %-98 %] 97 % (03/29 0756) Weight:  [135 lb (61.236 kg)-148 lb 13 oz (67.5 kg)] 148 lb 13 oz (67.5 kg) (03/28 2023) Last BM Date: 04/19/12  24-hour weight change: Weight change:   Intake/Output:   Intake/Output Summary (Last 24 hours) at 04/20/12 0940 Last data filed at 04/20/12 0700  Gross per 24 hour  Intake 2802.6 ml  Output   1275 ml  Net 1527.6 ml      Physical Exam: General: Vital signs reviewed and noted. Well-developed, well-nourished, in no acute distress; alert, appropriate and cooperative throughout examination.  Lungs:  Normal respiratory effort. Clear to auscultation BL without crackles or wheezes.  Heart: RRR. S1 and S2 normal without gallop, murmur, or rubs.  Abdomen:  BS normoactive. Soft, Nondistended, non-tender.  No masses or organomegaly.  Extremities: No pretibial edema.     Labs:  Basic Metabolic Panel:  Recent Labs Lab 04/19/12 1157 04/20/12 0600  NA 141 141  K 3.3* 3.2*  CL 106 108  CO2 26 23  GLUCOSE 132* 97  BUN 6 3*  CREATININE 0.67 0.52  CALCIUM 8.5 8.1*    Liver Function Tests:  Recent Labs Lab 04/19/12 1157 04/20/12 0600  AST 28 18  ALT 12 10  ALKPHOS 125* 111  BILITOT 0.1* 0.2*  PROT 6.4 5.6*  ALBUMIN 3.3* 2.7*    Recent Labs Lab 04/19/12 1157  LIPASE 23   CBC:  Recent Labs Lab 04/19/12 1157  04/20/12 0600  WBC 17.1* 8.3  NEUTROABS 13.5*  --   HGB 10.9* 9.7*  HCT 32.6* 29.0*  MCV 89.8 88.1  PLT 355 291   Coagulation Studies:  Recent Labs  04/19/12 1157 04/20/12 0600  LABPROT 12.9 14.3  INR 0.98 1.13    Imaging: Dg Chest Port 1 View  04/19/2012  *RADIOLOGY REPORT*  Clinical Data: Drug overdose  PORTABLE CHEST - 1 VIEW  Comparison: None.  Findings: The cardiac shadow is within normal limits.  Poor inspiratory effort is noted with crowding of vascular markings. Some very mild vascular congestion is noted.  IMPRESSION: Mild vascular congestion. Poor inspiratory effort.   Original Report Authenticated By: Alcide Clever, M.D.        Medications:    Infusions: . sodium chloride 200 mL/hr at 04/20/12 0855  . acetylcysteine 15 mg/kg/hr (04/19/12 1704)    Scheduled Medications: . enoxaparin (LOVENOX) injection  40 mg Subcutaneous Q24H  . potassium chloride SA  40 mEq Oral Q4H  . sodium chloride  3 mL Intravenous Q12H    PRN Medications: [email or web address]-menthol   Assessment/ Plan:   Pt is a 27 y.o. yo female with a PMHx of depression, recurrent diverticulitis s/p total colectomy, who was admitted on 04/19/2012 with symptoms of drowsiness, which was determined to be secondary to drug overdose.   Drug overdose - tylenol level now undetectable. Patient  was seen by psychiatry who do not recommend inpatient psych but do believe she will need rehabilitation in the near future and has referred her to a inpatient rehab center in [place]. Unfortunately, the patient states that her mother will not allow her back at her home, which complicates discharge, as she is otherwise medically stable this AM. She will be seen by CSW, who will likely provide her with options regarding homeless shelters.  - likely discharge today after patient given temporary living (homeless shelter) options by CSW - instructed patient not to take temazepam, lyrica, or seroquel at discharge, and to f/u with PCP  regarding her medications   Depression - will hold all antidepressant medications at this time, given the patient's altered mental status and likely drug overdose.   DVT PPX - lovenox  CODE STATUS - full CONSULTS PLACED - N/A DISPO - Disposition is deferred at this time, awaiting improvement of current medical problems.  Anticipated discharge in approximately <24 hours.  The patient does have a current PCP (No primary provider on file.) and does not need an [facility] hospital follow-up appointment after discharge.  Is the [facility] hospital follow-up appointment a one-time only appointment? not applicable.  Does the patient have transportation limitations that hinder transportation to clinic appointments? no SERVICE NEEDED AT DISCHARGE - TO BE DETERMINED DURING HOSPITAL COURSE Y = Yes, Blank = No  PT:    OT:    RN:    Equipment:    Other:         Length of Stay: 1 day(s)   Signed: Elfredia Nevins, MD  PGY-1, Internal Medicine Resident Pager: [phone] (7AM-5PM) 04/20/2012, 9:40 AM

## 2012-04-20 NOTE — Progress Notes (Signed)
Pt discharged home; mom here to drive patient; discharge instructions given; all questions answered; IV removed; patient's vital signs stable; no complications noted

## 2012-04-21 LAB — CBC
MCV: 91 fL (ref 80–100)
Platelet: 319 10*3/uL (ref 150–440)
RBC: 3.86 10*6/uL (ref 3.80–5.20)
RDW: 17.2 % — ABNORMAL HIGH (ref 11.5–14.5)
WBC: 14.1 10*3/uL — ABNORMAL HIGH (ref 3.6–11.0)

## 2012-04-21 LAB — URINALYSIS, COMPLETE
Bilirubin,UR: NEGATIVE
Blood: NEGATIVE
Glucose,UR: NEGATIVE mg/dL (ref 0–75)
Hyaline Cast: 10
Ketone: NEGATIVE
Leukocyte Esterase: NEGATIVE
Nitrite: NEGATIVE
Ph: 6 (ref 4.5–8.0)
RBC,UR: NONE SEEN /HPF (ref 0–5)
Specific Gravity: 1.014 (ref 1.003–1.030)
WBC UR: 1 /HPF (ref 0–5)

## 2012-04-21 LAB — COMPREHENSIVE METABOLIC PANEL
Alkaline Phosphatase: 154 U/L — ABNORMAL HIGH (ref 50–136)
Anion Gap: 11 (ref 7–16)
BUN: 7 mg/dL (ref 7–18)
Bilirubin,Total: 0.4 mg/dL (ref 0.2–1.0)
Calcium, Total: 9 mg/dL (ref 8.5–10.1)
Chloride: 106 mmol/L (ref 98–107)
Creatinine: 0.9 mg/dL (ref 0.60–1.30)
EGFR (Non-African Amer.): >60
Osmolality: 277 (ref 275–301)
Potassium: 3.5 mmol/L (ref 3.5–5.1)
SGPT (ALT): 66 U/L (ref 12–78)
Sodium: 139 mmol/L (ref 136–145)

## 2012-04-21 LAB — LIPASE, BLOOD: Lipase: 214 U/L (ref 73–393)

## 2012-04-22 LAB — DRUG SCREEN, URINE
Benzodiazepine, Ur Scrn: NEGATIVE (ref ?–200)
Cannabinoid 50 Ng, Ur ~~LOC~~: NEGATIVE (ref ?–50)
Methadone, Ur Screen: NEGATIVE (ref ?–300)
Opiate, Ur Screen: POSITIVE (ref ?–300)

## 2012-04-23 LAB — BASIC METABOLIC PANEL
Anion Gap: 9 (ref 7–16)
BUN: 3 mg/dL — ABNORMAL LOW (ref 7–18)
Chloride: 111 mmol/L — ABNORMAL HIGH (ref 98–107)
Co2: 23 mmol/L (ref 21–32)
Creatinine: 0.42 mg/dL — ABNORMAL LOW (ref 0.60–1.30)
EGFR (African American): >60
EGFR (Non-African Amer.): >60
Glucose: 98 mg/dL (ref 65–99)
Osmolality: 281 (ref 275–301)
Potassium: 3.1 mmol/L — ABNORMAL LOW (ref 3.5–5.1)
Sodium: 143 mmol/L (ref 136–145)

## 2012-04-23 LAB — CBC WITH DIFFERENTIAL/PLATELET
Basophil #: 0.1 10*3/uL (ref 0.0–0.1)
Eosinophil #: 0.3 10*3/uL (ref 0.0–0.7)
HGB: 11.1 g/dL — ABNORMAL LOW (ref 12.0–16.0)
Lymphocyte #: 1 10*3/uL (ref 1.0–3.6)
Lymphocyte %: 16 %
MCH: 30.7 pg (ref 26.0–34.0)
MCHC: 34.4 g/dL (ref 32.0–36.0)
MCV: 89 fL (ref 80–100)
Neutrophil #: 4.2 10*3/uL (ref 1.4–6.5)
Neutrophil %: 69.7 %
Platelet: 273 10*3/uL (ref 150–440)
WBC: 6 10*3/uL (ref 3.6–11.0)

## 2012-04-24 ENCOUNTER — Inpatient Hospital Stay: Payer: Self-pay | Admitting: Internal Medicine

## 2012-04-26 ENCOUNTER — Emergency Department: Payer: Self-pay | Admitting: Emergency Medicine

## 2012-04-26 LAB — CBC WITH DIFFERENTIAL/PLATELET
Basophil #: 0.1 10*3/uL (ref 0.0–0.1)
Basophil %: 1 %
Eosinophil #: 0.3 10*3/uL (ref 0.0–0.7)
HCT: 30.2 % — ABNORMAL LOW (ref 35.0–47.0)
MCH: 30.4 pg (ref 26.0–34.0)
MCHC: 33.9 g/dL (ref 32.0–36.0)
MCV: 90 fL (ref 80–100)
Monocyte %: 10.9 %
Neutrophil #: 2.6 10*3/uL (ref 1.4–6.5)
Neutrophil %: 43.9 %
Platelet: 292 10*3/uL (ref 150–440)
RBC: 3.37 10*6/uL — ABNORMAL LOW (ref 3.80–5.20)
RDW: 17.3 % — ABNORMAL HIGH (ref 11.5–14.5)
WBC: 6 10*3/uL (ref 3.6–11.0)

## 2012-04-26 LAB — URINALYSIS, COMPLETE
Bilirubin,UR: NEGATIVE
Glucose,UR: NEGATIVE mg/dL (ref 0–75)
Ketone: NEGATIVE
Nitrite: NEGATIVE
Ph: 7 (ref 4.5–8.0)
Protein: NEGATIVE
RBC,UR: 3 /HPF (ref 0–5)
Specific Gravity: 1.011 (ref 1.003–1.030)
Squamous Epithelial: 2
WBC UR: 1 /HPF (ref 0–5)

## 2012-04-26 LAB — COMPREHENSIVE METABOLIC PANEL
Albumin: 3.9 g/dL (ref 3.4–5.0)
Alkaline Phosphatase: 168 U/L — ABNORMAL HIGH (ref 50–136)
Anion Gap: 6 — ABNORMAL LOW (ref 7–16)
Bilirubin,Total: 0.2 mg/dL (ref 0.2–1.0)
Calcium, Total: 9.2 mg/dL (ref 8.5–10.1)
Chloride: 108 mmol/L — ABNORMAL HIGH (ref 98–107)
Co2: 27 mmol/L (ref 21–32)
EGFR (Non-African Amer.): >60
Glucose: 99 mg/dL (ref 65–99)
Potassium: 4 mmol/L (ref 3.5–5.1)
SGPT (ALT): 187 U/L — ABNORMAL HIGH (ref 12–78)
Sodium: 141 mmol/L (ref 136–145)
Total Protein: 8 g/dL (ref 6.4–8.2)

## 2012-04-26 LAB — PREGNANCY, URINE: Pregnancy Test, Urine: NEGATIVE m[IU]/mL

## 2012-04-26 LAB — CBC
HCT: 35.9 % (ref 35.0–47.0)
HGB: 11.8 g/dL — ABNORMAL LOW (ref 12.0–16.0)
MCHC: 32.8 g/dL (ref 32.0–36.0)
MCV: 91 fL (ref 80–100)
Platelet: 343 10*3/uL (ref 150–440)
RBC: 3.96 10*6/uL (ref 3.80–5.20)
WBC: 8.5 10*3/uL (ref 3.6–11.0)

## 2012-04-26 LAB — BASIC METABOLIC PANEL
Anion Gap: 6 — ABNORMAL LOW (ref 7–16)
BUN: 5 mg/dL — ABNORMAL LOW (ref 7–18)
Calcium, Total: 8.3 mg/dL — ABNORMAL LOW (ref 8.5–10.1)
Chloride: 109 mmol/L — ABNORMAL HIGH (ref 98–107)
Creatinine: 0.56 mg/dL — ABNORMAL LOW (ref 0.60–1.30)
EGFR (African American): >60
EGFR (Non-African Amer.): >60
Glucose: 97 mg/dL (ref 65–99)
Potassium: 3.3 mmol/L — ABNORMAL LOW (ref 3.5–5.1)
Sodium: 141 mmol/L (ref 136–145)

## 2012-04-30 ENCOUNTER — Encounter (HOSPITAL_COMMUNITY): Payer: Self-pay

## 2012-04-30 ENCOUNTER — Emergency Department (HOSPITAL_COMMUNITY)
Admission: EM | Admit: 2012-04-30 | Discharge: 2012-05-01 | Disposition: A | Discharge status: Home / Self Care | Payer: Medicare Other | Attending: Emergency Medicine | Admitting: Emergency Medicine

## 2012-04-30 DIAGNOSIS — F329 Major depressive disorder, single episode, unspecified: Secondary | ICD-10-CM | POA: Insufficient documentation

## 2012-04-30 DIAGNOSIS — F3289 Other specified depressive episodes: Secondary | ICD-10-CM | POA: Insufficient documentation

## 2012-04-30 DIAGNOSIS — L03119 Cellulitis of unspecified part of limb: Secondary | ICD-10-CM | POA: Insufficient documentation

## 2012-04-30 DIAGNOSIS — Z8719 Personal history of other diseases of the digestive system: Secondary | ICD-10-CM | POA: Insufficient documentation

## 2012-04-30 DIAGNOSIS — Z79899 Other long term (current) drug therapy: Secondary | ICD-10-CM | POA: Insufficient documentation

## 2012-04-30 DIAGNOSIS — F17200 Nicotine dependence, unspecified, uncomplicated: Secondary | ICD-10-CM | POA: Insufficient documentation

## 2012-04-30 DIAGNOSIS — Z8739 Personal history of other diseases of the musculoskeletal system and connective tissue: Secondary | ICD-10-CM | POA: Insufficient documentation

## 2012-04-30 DIAGNOSIS — G40909 Epilepsy, unspecified, not intractable, without status epilepticus: Secondary | ICD-10-CM | POA: Insufficient documentation

## 2012-04-30 DIAGNOSIS — F1910 Other psychoactive substance abuse, uncomplicated: Secondary | ICD-10-CM | POA: Insufficient documentation

## 2012-04-30 DIAGNOSIS — L0390 Cellulitis, unspecified: Secondary | ICD-10-CM

## 2012-04-30 DIAGNOSIS — L02419 Cutaneous abscess of limb, unspecified: Secondary | ICD-10-CM | POA: Insufficient documentation

## 2012-04-30 DIAGNOSIS — Z87442 Personal history of urinary calculi: Secondary | ICD-10-CM | POA: Insufficient documentation

## 2012-04-30 DIAGNOSIS — R109 Unspecified abdominal pain: Secondary | ICD-10-CM | POA: Insufficient documentation

## 2012-04-30 NOTE — ED Notes (Signed)
Pt states pain to rt foot.  Noted scabs to top of foot with reddened area. Pt states no trauma noted.  No tight shoes etc.  Pt also complains that left arm is red and painful and doing same thing foot did prior to scabbing.

## 2012-04-30 NOTE — Discharge Summary (Signed)
Patient Name: Sally Walker  MRN:  [id number]   DOB: 06-May-1985   PCP: No primary provider on file.         Date of Admission: 04/19/2012  Date of Discharge: 04/30/2012        Attending Physician: No att. providers found      DISCHARGE DIAGNOSES: Drug overdose Depression   DISPOSITION AND FOLLOW-UP: Sally Walker is to follow-up with the listed providers as detailed below, at which time, the following should be addressed:   1. F/u on patient's recent drug overdose, and strongly consider not prescribing opioids in the future. Also proceed very cautiously when prescribing other sedating medications given the patient has had multiple drug overdoses.  2. Labs / imaging needed at time of follow-up: N/A 3. Pending labs/ test needing follow-up: N/A    DISCHARGE INSTRUCTIONS: Follow-up Information   Follow up with Rolm Gala, MD. Schedule an appointment as soon as possible for a visit on 04/22/2012. (hospital follow up - please have your electrolytes checked, your potassium was low)    Contact information:   [address] [place] [address] [phone]      Discharge Orders   Future Orders Complete By Expires     Diet - low sodium heart healthy  As directed     Increase activity slowly  As directed         DISCHARGE MEDICATIONS:   Medication List    STOP taking these medications       pregabalin 150 MG capsule  Commonly known as:  LYRICA     QUEtiapine 400 MG tablet  Commonly known as:  SEROQUEL      TAKE these medications       FLUoxetine 20 MG tablet  Commonly known as:  PROZAC  Take 20 mg by mouth daily.     HYDROcodone-acetaminophen 10-325 MG per tablet  Commonly known as:  NORCO  Take 1 tablet by mouth every 6 (six) hours as needed for pain.     LamoTRIgine 200 MG Tb24  Take 400 mg by mouth at bedtime.         CONSULTS:  Treatment Team:  Nehemiah Settle, MD    PROCEDURES PERFORMED:    Dg Chest Port 1 View  04/19/2012  *RADIOLOGY REPORT*  Clinical Data: Drug overdose  PORTABLE CHEST - 1 VIEW  Comparison: None.  Findings: The cardiac shadow is within normal limits.  Poor inspiratory effort is noted with crowding of vascular markings. Some very mild vascular congestion is noted.  IMPRESSION: Mild vascular congestion. Poor inspiratory effort.   Original Report Authenticated By: Alcide Clever, M.D.        ADMISSION DATA: H&P: Patient is a 27 y.o. female with a PMHx of depression, recurrent diverticulitis s/p total colectomy, who presents to [facility] for evaluation of drowsiness. The patient states that her mom insisted she come to the ED for evaluation and admission after she was noted at home to be very drowsy, and had dropped several of her Norco pills that were discovered by her nephews at home. The patient denies having taken any of her Norco for the last 1-1/2 weeks, and denies taking any more of her medications than usual, however she does admit that she stopped taking her Seroquel for the last 2 weeks, and just resumed taking it last night. She states she's been taking her Lyrica, Lamictal, Prozac as prescribed. She admits to taking 4 Tylenol tablets 2 days ago,  but denies taking any Tylenol since that time. She admits to a past history of methamphetamine and crack cocaine abuse, but denies any recent drug use. She denies any alcohol use. She admits to smoking one pack per day.  The patient denies any history of suicidal attempts, and denies any intentional overdose of her medication at this time. She admits to intentionally taking too many Norco approximately 2 weeks ago, and states she took 12-16 tablets at that time due to recalcitrant back pain, but denies any intention of self-harm or of trying to end her life. She states she's otherwise been feeling well prior to her presentation today. She has a history of seizure disorder, and although she admits to dizziness last night which was  consistent with her usual seizure auras, she denies any actual seizures last night, and states her last seizure was approximately 2 years ago. She states she's otherwise been feeling well prior to admission, and has no complaints.  Physical Exam: General: Vital signs reviewed and noted. Somnolent.  Head: Normocephalic, atraumatic.  Eyes: PERRL, EOMI, No signs of anemia or jaundice.  Nose: Mucous membranes moist, not inflammed, nonerythematous.  Throat: Oropharynx nonerythematous, no exudate appreciated.  Neck: No deformities, masses, or tenderness noted.  Lungs: Normal respiratory effort. Clear to auscultation BL without crackles or wheezes.  Heart: RRR. S1 and S2 normal without gallop, murmur, or rubs.  Abdomen: BS normoactive. Soft, Nondistended, non-tender. Multiple abdominal surgical scars.  Extremities: No pretibial edema.  Neurologic: A&O X3, CN II - XII are grossly intact. Motor strength is 5/5 in the all 4 extremities, Sensations intact to light touch, cerebellar signs negative.  Skin: No visible rashes, scars.   Labs: Comprehensive Metabolic Panel:    Component  Value  Date/Time    NA  141  04/19/2012 1157    K  3.3*  04/19/2012 1157    CL  106  04/19/2012 1157    CO2  26  04/19/2012 1157    BUN  6  04/19/2012 1157    CREATININE  0.67  04/19/2012 1157    GLUCOSE  132*  04/19/2012 1157    CALCIUM  8.5  04/19/2012 1157    AST  28  04/19/2012 1157    ALT  12  04/19/2012 1157    ALKPHOS  125*  04/19/2012 1157    BILITOT  0.1*  04/19/2012 1157    PROT  6.4  04/19/2012 1157    ALBUMIN  3.3*  04/19/2012 1157    CBC:    Component  Value  Date/Time    WBC  17.1*  04/19/2012 1157    HGB  10.9*  04/19/2012 1157    HCT  32.6*  04/19/2012 1157    PLT  355  04/19/2012 1157    MCV  89.8  04/19/2012 1157    NEUTROABS  13.5*  04/19/2012 1157    LYMPHSABS  1.6  04/19/2012 1157    MONOABS  1.8*  04/19/2012 1157    EOSABS  0.2  04/19/2012 1157    BASOSABS  0.0  04/19/2012 1157      HOSPITAL  COURSE: Drug overdose -  Given patient was positive for opioids and tylenol, suspect she overdosed on her home norco, as she has a history of a similar overdose within the last few weeks. She denied any intent to overdose on this med (or any other of her meds). Tylenol level was elevated on admission and as the patient could not give a reliable  history as to last ingestion, she was given IV NAC. Repeat tylenol level after admission was undetectable, and NAC was d/c'd. Patient was seen by psychiatry who did not recommend inpatient psych but believed she will need drug rehabilitation in the near future and referred her to a inpatient rehab center in [place]. At discharge, patient was instructed not to take temazepam, lyrica, or seroquel, and to f/u with her PCP regarding what medications she should be taking.   Depression - all antidepressant medications held at discharge, given the patient's presentation with altered mental status and likely drug overdose.     DISCHARGE DATA: Vital Signs: BP 125/67  Pulse 76  Temp(Src) 98.4 F (36.9 C) (Oral)  Resp 13  Ht 5\' 6"  (1.676 m)  Wt 148 lb 13 oz (67.5 kg)  BMI 24.03 kg/m2  SpO2 99%  LMP 04/11/2012  Labs: No results found for this or any previous visit (from the past 24 hour(s)).   Time spent on discharge: 40 minutes  Services Ordered on Discharge: Y = Yes; Blank = No PT:   OT:   RN:   Equipment:   Other:     Signed: Elfredia Nevins, MD   PGY 1, Internal Medicine Resident 04/30/2012, 5:14 PM

## 2012-04-30 NOTE — ED Notes (Signed)
Per EMS pt is c/o right foot pain with swelling and a small abrasion noted  Pt is c/o left shoulder pain as well

## 2012-05-01 ENCOUNTER — Encounter (HOSPITAL_COMMUNITY): Payer: Self-pay | Admitting: Emergency Medicine

## 2012-05-01 ENCOUNTER — Telehealth (HOSPITAL_COMMUNITY): Payer: Self-pay | Admitting: Emergency Medicine

## 2012-05-01 ENCOUNTER — Emergency Department (HOSPITAL_COMMUNITY): Payer: Medicare Other

## 2012-05-01 ENCOUNTER — Observation Stay (HOSPITAL_COMMUNITY)
Admission: EM | Admit: 2012-05-01 | Discharge: 2012-05-05 | Disposition: A | Discharge status: Home / Self Care | Payer: Medicare Other | Attending: Internal Medicine | Admitting: Internal Medicine

## 2012-05-01 DIAGNOSIS — F19939 Other psychoactive substance use, unspecified with withdrawal, unspecified: Principal | ICD-10-CM | POA: Insufficient documentation

## 2012-05-01 DIAGNOSIS — G8929 Other chronic pain: Secondary | ICD-10-CM | POA: Insufficient documentation

## 2012-05-01 DIAGNOSIS — F1120 Opioid dependence, uncomplicated: Secondary | ICD-10-CM | POA: Diagnosis present

## 2012-05-01 DIAGNOSIS — N390 Urinary tract infection, site not specified: Secondary | ICD-10-CM

## 2012-05-01 DIAGNOSIS — F329 Major depressive disorder, single episode, unspecified: Secondary | ICD-10-CM | POA: Insufficient documentation

## 2012-05-01 DIAGNOSIS — Z720 Tobacco use: Secondary | ICD-10-CM | POA: Diagnosis present

## 2012-05-01 DIAGNOSIS — F1193 Opioid use, unspecified with withdrawal: Secondary | ICD-10-CM

## 2012-05-01 DIAGNOSIS — Z5900 Homelessness unspecified: Secondary | ICD-10-CM | POA: Insufficient documentation

## 2012-05-01 DIAGNOSIS — R748 Abnormal levels of other serum enzymes: Secondary | ICD-10-CM | POA: Diagnosis present

## 2012-05-01 DIAGNOSIS — F32A Depression, unspecified: Secondary | ICD-10-CM | POA: Diagnosis present

## 2012-05-01 DIAGNOSIS — Z638 Other specified problems related to primary support group: Secondary | ICD-10-CM | POA: Insufficient documentation

## 2012-05-01 DIAGNOSIS — L0390 Cellulitis, unspecified: Secondary | ICD-10-CM

## 2012-05-01 DIAGNOSIS — M25512 Pain in left shoulder: Secondary | ICD-10-CM | POA: Diagnosis present

## 2012-05-01 DIAGNOSIS — F1123 Opioid dependence with withdrawal: Secondary | ICD-10-CM

## 2012-05-01 DIAGNOSIS — R109 Unspecified abdominal pain: Secondary | ICD-10-CM | POA: Insufficient documentation

## 2012-05-01 DIAGNOSIS — F17200 Nicotine dependence, unspecified, uncomplicated: Secondary | ICD-10-CM | POA: Insufficient documentation

## 2012-05-01 DIAGNOSIS — F1920 Other psychoactive substance dependence, uncomplicated: Secondary | ICD-10-CM | POA: Diagnosis present

## 2012-05-01 DIAGNOSIS — F3289 Other specified depressive episodes: Secondary | ICD-10-CM | POA: Insufficient documentation

## 2012-05-01 DIAGNOSIS — M25519 Pain in unspecified shoulder: Secondary | ICD-10-CM | POA: Insufficient documentation

## 2012-05-01 DIAGNOSIS — Z9049 Acquired absence of other specified parts of digestive tract: Secondary | ICD-10-CM | POA: Insufficient documentation

## 2012-05-01 HISTORY — DX: Other psychoactive substance dependence, uncomplicated: F19.20

## 2012-05-01 HISTORY — DX: Age-related osteoporosis without current pathological fracture: M81.0

## 2012-05-01 HISTORY — DX: Rheumatoid arthritis, unspecified: M06.9

## 2012-05-01 HISTORY — DX: Other psychoactive substance abuse, uncomplicated: F19.10

## 2012-05-01 HISTORY — DX: Calculus of kidney: N20.0

## 2012-05-01 HISTORY — DX: Fibromyalgia: M79.7

## 2012-05-01 LAB — COMPREHENSIVE METABOLIC PANEL
Albumin: 3.9 g/dL (ref 3.5–5.2)
BUN: 10 mg/dL (ref 6–23)
Chloride: 105 mEq/L (ref 96–112)
Creatinine, Ser: 0.62 mg/dL (ref 0.50–1.10)
Total Bilirubin: 0.3 mg/dL (ref 0.3–1.2)

## 2012-05-01 LAB — URINALYSIS, ROUTINE W REFLEX MICROSCOPIC
Glucose, UA: NEGATIVE mg/dL
Ketones, ur: 15 mg/dL — AB
Specific Gravity, Urine: 1.023 (ref 1.005–1.030)
pH: 8 (ref 5.0–8.0)

## 2012-05-01 LAB — CBC WITH DIFFERENTIAL/PLATELET
Eosinophils Relative: 3 % (ref 0–5)
HCT: 34.8 % — ABNORMAL LOW (ref 36.0–46.0)
Lymphocytes Relative: 34 % (ref 12–46)
Lymphs Abs: 3.6 10*3/uL (ref 0.7–4.0)
MCV: 89.7 fL (ref 78.0–100.0)
Monocytes Absolute: 1.1 10*3/uL — ABNORMAL HIGH (ref 0.1–1.0)
Monocytes Relative: 10 % (ref 3–12)
RBC: 3.88 MIL/uL (ref 3.87–5.11)
WBC: 10.4 10*3/uL (ref 4.0–10.5)

## 2012-05-01 LAB — CBC
HCT: 33.7 % — ABNORMAL LOW (ref 36.0–46.0)
MCHC: 32.6 g/dL (ref 30.0–36.0)
MCV: 88.2 fL (ref 78.0–100.0)
RDW: 16.7 % — ABNORMAL HIGH (ref 11.5–15.5)
WBC: 11.7 10*3/uL — ABNORMAL HIGH (ref 4.0–10.5)

## 2012-05-01 LAB — URINE MICROSCOPIC-ADD ON

## 2012-05-01 LAB — POCT I-STAT, CHEM 8
BUN: 12 mg/dL (ref 6–23)
Calcium, Ion: 1.19 mmol/L (ref 1.12–1.23)
Creatinine, Ser: 0.8 mg/dL (ref 0.50–1.10)
Glucose, Bld: 95 mg/dL (ref 70–99)
TCO2: 28 mmol/L (ref 0–100)

## 2012-05-01 LAB — ETHANOL: Alcohol, Ethyl (B): <11 mg/dL (ref 0–11)

## 2012-05-01 LAB — PREGNANCY, URINE: Preg Test, Ur: NEGATIVE

## 2012-05-01 LAB — RAPID URINE DRUG SCREEN, HOSP PERFORMED: Benzodiazepines: NOT DETECTED

## 2012-05-01 LAB — SALICYLATE LEVEL: Salicylate Lvl: <2 mg/dL — ABNORMAL LOW (ref 2.8–20.0)

## 2012-05-01 MED ORDER — DEXTROSE 5 % IV SOLN
1.0000 g | Freq: Once | INTRAVENOUS | Status: AC
  Administered 2012-05-01: 1 g via INTRAVENOUS
  Filled 2012-05-01: qty 10

## 2012-05-01 MED ORDER — NICOTINE 21 MG/24HR TD PT24
21.0000 mg | MEDICATED_PATCH | Freq: Every day | TRANSDERMAL | Status: DC
  Administered 2012-05-02 – 2012-05-05 (×4): 21 mg via TRANSDERMAL
  Filled 2012-05-01 (×4): qty 1

## 2012-05-01 MED ORDER — ZOLPIDEM TARTRATE 5 MG PO TABS: 5.0000 mg | ORAL_TABLET | Freq: Every evening | ORAL | Status: DC | PRN

## 2012-05-01 MED ORDER — SODIUM CHLORIDE 0.9 % IV SOLN
Freq: Once | INTRAVENOUS | Status: AC
  Administered 2012-05-01: 02:00:00 via INTRAVENOUS

## 2012-05-01 MED ORDER — KETOROLAC TROMETHAMINE 30 MG/ML IJ SOLN
30.0000 mg | Freq: Once | INTRAMUSCULAR | Status: AC
  Administered 2012-05-01: 30 mg via INTRAVENOUS
  Filled 2012-05-01: qty 1

## 2012-05-01 MED ORDER — CLINDAMYCIN PHOSPHATE 600 MG/50ML IV SOLN
600.0000 mg | Freq: Once | INTRAVENOUS | Status: AC
  Administered 2012-05-01: 600 mg via INTRAVENOUS
  Filled 2012-05-01: qty 50

## 2012-05-01 MED ORDER — RISPERIDONE 2 MG PO TABS: 2.0000 mg | ORAL_TABLET | Freq: Every day | ORAL | Status: DC

## 2012-05-01 MED ORDER — ALUM & MAG HYDROXIDE-SIMETH 200-200-20 MG/5ML PO SUSP
30.0000 mL | ORAL | Status: DC | PRN
  Administered 2012-05-03: 30 mL via ORAL
  Filled 2012-05-01: qty 30

## 2012-05-01 MED ORDER — IBUPROFEN 600 MG PO TABS
600.0000 mg | ORAL_TABLET | Freq: Three times a day (TID) | ORAL | Status: DC | PRN
  Administered 2012-05-02 – 2012-05-03 (×4): 600 mg via ORAL
  Filled 2012-05-01 (×4): qty 1

## 2012-05-01 MED ORDER — ONDANSETRON HCL 4 MG PO TABS
4.0000 mg | ORAL_TABLET | Freq: Three times a day (TID) | ORAL | Status: DC | PRN
  Administered 2012-05-02 – 2012-05-04 (×6): 4 mg via ORAL
  Filled 2012-05-01 (×7): qty 1

## 2012-05-01 MED ORDER — CEFTRIAXONE SODIUM 1 G IJ SOLR: 1.0000 g | Freq: Once | INTRAMUSCULAR | Status: DC

## 2012-05-01 MED ORDER — HYDROCODONE-ACETAMINOPHEN 5-325 MG PO TABS: 2.0000 | ORAL_TABLET | ORAL | Status: DC | PRN

## 2012-05-01 MED ORDER — CLINDAMYCIN HCL 150 MG PO CAPS: 150.0000 mg | ORAL_CAPSULE | Freq: Four times a day (QID) | ORAL | Status: DC

## 2012-05-01 MED ORDER — HYDROMORPHONE HCL PF 1 MG/ML IJ SOLN
0.5000 mg | Freq: Once | INTRAMUSCULAR | Status: AC
  Administered 2012-05-01: 0.5 mg via INTRAVENOUS
  Filled 2012-05-01: qty 1

## 2012-05-01 MED ORDER — HYDROMORPHONE HCL PF 1 MG/ML IJ SOLN
0.5000 mg | Freq: Once | INTRAMUSCULAR | Status: AC
  Administered 2012-05-01: 0.5 mg via INTRAVENOUS

## 2012-05-01 MED ORDER — LORAZEPAM 1 MG PO TABS
1.0000 mg | ORAL_TABLET | Freq: Three times a day (TID) | ORAL | Status: DC | PRN
  Administered 2012-05-02: 1 mg via ORAL
  Filled 2012-05-01: qty 1

## 2012-05-01 MED ORDER — SODIUM CHLORIDE 0.9 % IV BOLUS (SEPSIS)
1000.0000 mL | Freq: Once | INTRAVENOUS | Status: AC
  Administered 2012-05-01: 1000 mL via INTRAVENOUS

## 2012-05-01 MED ORDER — ONDANSETRON HCL 4 MG PO TABS: 4.0000 mg | ORAL_TABLET | Freq: Once | ORAL | Status: DC

## 2012-05-01 NOTE — ED Notes (Signed)
The pt wants detox from  rx  Narcotics no alcohol.  She last had narcotics 2 days ago.  She reports that she is  Not using street drugs

## 2012-05-01 NOTE — ED Provider Notes (Signed)
Medical screening examination/treatment/procedure(s) were performed by non-physician practitioner and as supervising physician I was immediately available for consultation/collaboration.  Maddalynn Barnard, MD 05/01/12 0625 

## 2012-05-01 NOTE — ED Notes (Signed)
Pt states she is here for detox from pain pills.  States she is currently taking Dilaudid 8mg  every 30 minutes-1 hour x 2 weeks.  Denies suicidal and homicidal ideation.

## 2012-05-01 NOTE — ED Provider Notes (Signed)
History    This chart was scribed for non-physician practitioner Arthor Captain, PA-C working with Joya Gaskins, MD by Gerlean Ren, ED Scribe. This patient was seen in room TR06C/TR06C and the patient's care was started at 8:16 PM.   CSN: [id number]  Arrival date & time 05/01/12  1727   First MD Initiated Contact with Patient 05/01/12 1802      Chief Complaint  Patient presents with  . Medical Clearance     The history is provided by the patient. No language interpreter was used.  Sally Walker is a 27 y.o. female who presents to the Emergency Department wanting to detox from 10-325mg  pills that she takes 20-30 times per day for the past 2 weeks, last used 2 days ago.  Pt reports only current symptom is nausea, cold sweats, chills, and minimal abdominal pain. Pt denies emesis, palpitations.  Pt reports she started using pills 11 years ago that has worsened since.  No prior in-patient detox program attempts.  Pt has h/o depression and anxiety.  Pt denies SI, HI, visual/auditory hallucinations. Pt reports she was seen at [facility] ED yesterday for cellulitis on right foot dorsally for which she was prescribed antibiotics which she has not filled.  Past Medical History  Diagnosis Date  . SBO (small bowel obstruction) 2011    "scar tissue rupture on my intestines"   . Diverticulitis   . Depression     Per pt, no history of suicidal ideation  . Seizure disorder     Last seizure in 2012  . Lumbar herniated disc     per patient  . 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 Drug dependence   . Polysubstance abuse     Past Surgical History  Procedure Laterality Date  . Total colectomy      Done in [place]    No family history on file.  History  Substance Use Topics  . Smoking status: Current Every Day Smoker -- 1.00 packs/day for 14 years    Types: Cigarettes  . Smokeless tobacco: Not on file  . Alcohol Use: No      Review of Systems   Constitutional: Positive for chills.  Cardiovascular: Negative for palpitations.  Gastrointestinal: Positive for nausea and abdominal pain (minimal). Negative for vomiting.  All other systems reviewed and are negative.    Allergies  Adhesive; Fentanyl; Midodrine; Morphine and related; Toradol; Vancomycin; and Latex  Home Medications   Current Outpatient Rx  Name  Route  Sig  Dispense  Refill  . clindamycin (CLEOCIN) 150 MG capsule   Oral   Take 1 capsule (150 mg total) by mouth every 6 (six) hours.   28 capsule   0   . FLUoxetine (PROZAC) 20 MG tablet   Oral   Take 20 mg by mouth daily.         Marland Kitchen HYDROcodone-acetaminophen (NORCO/VICODIN) 5-325 MG per tablet   Oral   Take 2 tablets by mouth every 4 (four) hours as needed for pain.   10 tablet   0   . LamoTRIgine 200 MG TB24   Oral   Take 400 mg by mouth at bedtime.         . pregabalin (LYRICA) 100 MG capsule   Oral   Take 100 mg by mouth daily.         . QUEtiapine (SEROQUEL) 400 MG tablet   Oral   Take 400 mg by mouth at bedtime.  BP 125/68  Pulse 90  Temp(Src) 98.5 F (36.9 C) (Oral)  Resp 16  SpO2 98%  LMP 04/11/2012  Physical Exam  Nursing note and vitals reviewed. Constitutional: She is oriented to person, place, and time. She appears well-developed and well-nourished. No distress.  HENT:  Head: Normocephalic and atraumatic.  Eyes: EOM are normal.  Neck: Neck supple. No tracheal deviation present.  Cardiovascular: Normal rate, regular rhythm and normal heart sounds.   Pulmonary/Chest: Effort normal and breath sounds normal. No respiratory distress. She has no wheezes. She has no rales.  Musculoskeletal: Normal range of motion.  Neurological: She is alert and oriented to person, place, and time.  Skin: Skin is warm and dry.  Psychiatric: She has a normal mood and affect. Her behavior is normal.    ED Course  Procedures (including critical care time) DIAGNOSTIC  STUDIES: Oxygen Saturation is 98% on room air, normal by my interpretation.    COORDINATION OF CARE: 8:26 PM- Informed pt that I will perform medical clearance so that she can receive further ACT team evaluation.  Discussed IV antibiotics to treat UTI and cellulitis.  Pt verbalizes understanding and agreement.  Results for orders placed during the hospital encounter of 05/01/12  ACETAMINOPHEN LEVEL      Result Value Range   Acetaminophen (Tylenol), Serum <15.0  10 - 30 ug/mL  CBC      Result Value Range   WBC 11.7 (*) 4.0 - 10.5 K/uL   RBC 3.82 (*) 3.87 - 5.11 MIL/uL   Hemoglobin 11.0 (*) 12.0 - 15.0 g/dL   HCT 40.9 (*) 81.1 - 91.4 %   MCV 88.2  78.0 - 100.0 fL   MCH 28.8  26.0 - 34.0 pg   MCHC 32.6  30.0 - 36.0 g/dL   RDW 78.2 (*) 95.6 - 21.3 %   Platelets 503 (*) 150 - 400 K/uL  COMPREHENSIVE METABOLIC PANEL      Result Value Range   Sodium 140  135 - 145 mEq/L   Potassium 4.2  3.5 - 5.1 mEq/L   Chloride 105  96 - 112 mEq/L   CO2 25  19 - 32 mEq/L   Glucose, Bld 99  70 - 99 mg/dL   BUN 10  6 - 23 mg/dL   Creatinine, Ser 0.86  0.50 - 1.10 mg/dL   Calcium 9.5  8.4 - 57.8 mg/dL   Total Protein 7.7  6.0 - 8.3 g/dL   Albumin 3.9  3.5 - 5.2 g/dL   AST 469 (*) 0 - 37 U/L   ALT 88 (*) 0 - 35 U/L   Alkaline Phosphatase 141 (*) 39 - 117 U/L   Total Bilirubin 0.3  0.3 - 1.2 mg/dL   GFR calc non Af Amer >90  >90 mL/min   GFR calc Af Amer >90  >90 mL/min  ETHANOL      Result Value Range   Alcohol, Ethyl (B) <11  0 - 11 mg/dL  SALICYLATE LEVEL      Result Value Range   Salicylate Lvl <2.0 (*) 2.8 - 20.0 mg/dL  URINE RAPID DRUG SCREEN (HOSP PERFORMED)      Result Value Range   Opiates POSITIVE (*) NONE DETECTED   Cocaine NONE DETECTED  NONE DETECTED   Benzodiazepines NONE DETECTED  NONE DETECTED   Amphetamines NONE DETECTED  NONE DETECTED   Tetrahydrocannabinol NONE DETECTED  NONE DETECTED   Barbiturates NONE DETECTED  NONE DETECTED  URINALYSIS, ROUTINE W REFLEX MICROSCOPIC  Result Value Range   Color, Urine YELLOW  YELLOW   APPearance CLOUDY (*) CLEAR   Specific Gravity, Urine 1.023  1.005 - 1.030   pH 8.0  5.0 - 8.0   Glucose, UA NEGATIVE  NEGATIVE mg/dL   Hgb urine dipstick LARGE (*) NEGATIVE   Bilirubin Urine NEGATIVE  NEGATIVE   Ketones, ur 15 (*) NEGATIVE mg/dL   Protein, ur NEGATIVE  NEGATIVE mg/dL   Urobilinogen, UA 0.2  0.0 - 1.0 mg/dL   Nitrite NEGATIVE  NEGATIVE   Leukocytes, UA MODERATE (*) NEGATIVE  URINE MICROSCOPIC-ADD ON      Result Value Range   Squamous Epithelial / LPF MANY (*) RARE   WBC, UA 7-10  <3 WBC/hpf   RBC / HPF 11-20  <3 RBC/hpf   Bacteria, UA FEW (*) RARE   Urine-Other MUCOUS PRESENT      Dg Abd Acute W/chest  05/01/2012  *RADIOLOGY REPORT*  Clinical Data: Abdominal pain.  History of colectomy.  ACUTE ABDOMEN SERIES (ABDOMEN 2 VIEW & CHEST 1 VIEW)  Comparison: One-view chest 04/19/2012.  Findings: The heart size is normal.  The lungs are clear.  Supine and upright views the abdomen demonstrate moderate stool on the left side of the colon.  Suture material is present in the right abdomen.  There is no evidence for obstruction or free air. The axial skeleton is within normal limits.  Surgical clips are noted at the gallbladder fossa.  IMPRESSION:  1.  No acute abnormality of the chest. 2.  Moderate stool in the distal colon. 3.  No acute abnormality of the abdomen.   Original Report Authenticated By: Marin Roberts, M.D.    Dg Humerus Left  05/01/2012  *RADIOLOGY REPORT*  Clinical Data: Swelling.  Pain.  LEFT HUMERUS - 2+ VIEW  Comparison: None.  Findings: The shoulder and elbow joints are intact.  No acute bone or soft tissue abnormality is evident.  The visualized hemithorax is clear.  IMPRESSION: Negative left humerus.   Original Report Authenticated By: [place] Roberts, M.D.      1. Cellulitis   2. UTI (lower urinary tract infection)   3. Opiate withdrawal       MDM  Patient with cellulitis and opiate abuse  seeking detox.  Labs show a UTI.  I am treating her with IV rocephin and clinda.  She is currently homeless and does not have any follow up or access to care.  Patient with transaminitis and recent tylenol OD. I have discussed the case with Dr. Bebe Shaggy who has agreed to see and evaluate the patient.  Telepsych ordered.    I personally performed the services described in this documentation, which was scribed in my presence. The recorded information has been reviewed and is accurate.     Arthor Captain, PA-C 05/02/12 1446

## 2012-05-01 NOTE — ED Notes (Signed)
Pt ambulatory to br

## 2012-05-01 NOTE — Discharge Summary (Signed)
Internal Medicine Teaching Service Attending Note Date: 05/01/2012  Patient name: Sally Walker  Medical record number: [id number]  Date of birth: 08-09-1985    I evaluated the patient on the day of discharge and discussed the discharge plan with my resident team. I agree with the discharge documentation and disposition.  Thank you for working with me on this patient's care.   Thanks Aletta Edouard 05/01/2012, 8:56 AM

## 2012-05-01 NOTE — ED Notes (Signed)
Pt asking for pain med for arm. md aware new orders obtained.

## 2012-05-01 NOTE — ED Provider Notes (Signed)
History     CSN: [id number]  Arrival date & time 04/30/12  2222   First MD Initiated Contact with Patient 04/30/12 2352      Chief Complaint  Patient presents with  . Foot Pain    (Consider location/radiation/quality/duration/timing/severity/associated sxs/prior treatment) HPI Comments: Sally Walker is a 27 year old female with a history of multiple medical problems, including bowel resections small bowel obstructions, depression, seizure disorder, presented tonight with left upper arm.  A pain and swelling.  Warm nonfluctuant area at the head of the humerus as well as the dorsal aspect of her right foot, being swollen, red, painful, with a superficial oozing, rash, she, states, that it started as a blister 3, days, ago.  The results of that.  She denies any trauma, IV drug use, recent seizure no new drug/medications.  She was seen at [facility] and [facility] recently by her primary care physician's with no change in her medications.  Patient is a 27 y.o. female presenting with lower extremity pain. The history is provided by the patient.  Foot Pain This is a new problem. The current episode started in the past 7 days. The problem has been gradually worsening. Associated symptoms include abdominal pain, joint swelling and a rash. Pertinent negatives include no chills, fever, nausea, vomiting or weakness. The symptoms are aggravated by exertion. The treatment provided no relief.    Past Medical History  Diagnosis Date  . SBO (small bowel obstruction) 2011    "scar tissue rupture on my intestines"   . Diverticulitis   . Depression     Per pt, no history of suicidal ideation  . Seizure disorder     Last seizure in 2012  . Lumbar herniated disc     per patient  . 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 Total colectomy      Done in [place]    History reviewed. No pertinent family history.  History  Substance Use Topics   . Smoking status: Current Every Day Smoker -- 1.00 packs/day for 14 years  . Smokeless tobacco: Not on file  . Alcohol Use: No    OB History   Grav Para Term Preterm Abortions TAB SAB Ect Mult Living                  Review of Systems  Unable to perform ROS Constitutional: Negative for fever and chills.  Gastrointestinal: Positive for abdominal pain. Negative for nausea, vomiting and diarrhea.  Genitourinary: Negative for dysuria.  Musculoskeletal: Positive for joint swelling.  Skin: Positive for rash.  Neurological: Negative for dizziness and weakness.  All other systems reviewed and are negative.    Allergies  Adhesive; Fentanyl; Midodrine; Morphine and related; Toradol; Vancomycin; and Latex  Home Medications   Current Outpatient Rx  Name  Route  Sig  Dispense  Refill  . FLUoxetine (PROZAC) 20 MG tablet   Oral   Take 20 mg by mouth daily.         . LamoTRIgine 200 MG TB24   Oral   Take 400 mg by mouth at bedtime.         . pregabalin (LYRICA) 100 MG capsule   Oral   Take 100 mg by mouth daily.         . QUEtiapine (SEROQUEL) 400 MG tablet   Oral   Take 400 mg by mouth at bedtime.         Marland Kitchen  clindamycin (CLEOCIN) 150 MG capsule   Oral   Take 1 capsule (150 mg total) by mouth every 6 (six) hours.   28 capsule   0   . HYDROcodone-acetaminophen (NORCO/VICODIN) 5-325 MG per tablet   Oral   Take 2 tablets by mouth every 4 (four) hours as needed for pain.   10 tablet   0     BP 111/64  Pulse 83  Temp(Src) 98.3 F (36.8 C)  Resp 16  SpO2 95%  LMP 04/11/2012  Physical Exam  Nursing note and vitals reviewed. Constitutional: She is oriented to person, place, and time. She appears well-nourished.  HENT:  Head: Normocephalic.  Eyes: Pupils are equal, round, and reactive to light.  Neck: Normal range of motion.  Cardiovascular: Regular rhythm.  Tachycardia present.   Pulmonary/Chest: Effort normal and breath sounds normal.  Abdominal: Soft.  Bowel sounds are normal. She exhibits no distension. There is no tenderness.  Multiple well healed surgical wounds throughout the entire abdomen. Left lower quadrant, right lower quadrant colostomy and ileostomy, takedown and reanastomosis, and bowel resection  Musculoskeletal: Normal range of motion.  Neurological: She is alert and oriented to person, place, and time.  Skin: Skin is warm.    ED Course  Procedures (including critical care time)  Labs Reviewed  CBC WITH DIFFERENTIAL - Abnormal; Notable for the following:    Hemoglobin 11.4 (*)    HCT 34.8 (*)    RDW 17.0 (*)    Platelets 469 (*)    Monocytes Absolute 1.1 (*)    All other components within normal limits  POCT I-STAT, CHEM 8   Dg Abd Acute W/chest  05/01/2012  *RADIOLOGY REPORT*  Clinical Data: Abdominal pain.  History of colectomy.  ACUTE ABDOMEN SERIES (ABDOMEN 2 VIEW & CHEST 1 VIEW)  Comparison: One-view chest 04/19/2012.  Findings: The heart size is normal.  The lungs are clear.  Supine and upright views the abdomen demonstrate moderate stool on the left side of the colon.  Suture material is present in the right abdomen.  There is no evidence for obstruction or free air. The axial skeleton is within normal limits.  Surgical clips are noted at the gallbladder fossa.  IMPRESSION:  1.  No acute abnormality of the chest. 2.  Moderate stool in the distal colon. 3.  No acute abnormality of the abdomen.   Original Report Authenticated By: Marin Roberts, M.D.    Dg Humerus Left  05/01/2012  *RADIOLOGY REPORT*  Clinical Data: Swelling.  Pain.  LEFT HUMERUS - 2+ VIEW  Comparison: None.  Findings: The shoulder and elbow joints are intact.  No acute bone or soft tissue abnormality is evident.  The visualized hemithorax is clear.  IMPRESSION: Negative left humerus.   Original Report Authenticated By: Marin Roberts, M.D.      1. Cellulitis       MDM  She does not have an elevated white count and started on  antibiotic, pain control, recommended that she follow up with her primary care physician for review of her cellulitis in 24-48 hours.  Her acute abdomen x-ray shows normal bowel gas pattern.  No sign of small bowel obstruction or constipation         Arman Filter, NP 05/01/12 0430  Arman Filter, NP 05/01/12 0431  Arman Filter, NP 05/01/12 [phone]

## 2012-05-01 NOTE — ED Notes (Signed)
Pt states that she has been have body aches for the past 3 days.

## 2012-05-02 ENCOUNTER — Encounter (HOSPITAL_COMMUNITY): Payer: Self-pay | Admitting: Internal Medicine

## 2012-05-02 ENCOUNTER — Telehealth (HOSPITAL_COMMUNITY): Payer: Self-pay | Admitting: Licensed Clinical Social Worker

## 2012-05-02 DIAGNOSIS — M25512 Pain in left shoulder: Secondary | ICD-10-CM | POA: Diagnosis present

## 2012-05-02 DIAGNOSIS — R748 Abnormal levels of other serum enzymes: Secondary | ICD-10-CM | POA: Diagnosis present

## 2012-05-02 DIAGNOSIS — F1120 Opioid dependence, uncomplicated: Secondary | ICD-10-CM

## 2012-05-02 DIAGNOSIS — F1910 Other psychoactive substance abuse, uncomplicated: Secondary | ICD-10-CM

## 2012-05-02 DIAGNOSIS — Z720 Tobacco use: Secondary | ICD-10-CM | POA: Diagnosis present

## 2012-05-02 DIAGNOSIS — L0390 Cellulitis, unspecified: Secondary | ICD-10-CM

## 2012-05-02 LAB — COMPREHENSIVE METABOLIC PANEL
ALT: 70 U/L — ABNORMAL HIGH (ref 0–35)
Calcium: 9 mg/dL (ref 8.4–10.5)
Creatinine, Ser: 0.56 mg/dL (ref 0.50–1.10)
GFR calc Af Amer: >90 mL/min (ref >90)
Glucose, Bld: 106 mg/dL — ABNORMAL HIGH (ref 70–99)
Sodium: 140 mEq/L (ref 135–145)
Total Protein: 6.9 g/dL (ref 6.0–8.3)

## 2012-05-02 LAB — CBC
HCT: 32.3 % — ABNORMAL LOW (ref 36.0–46.0)
Hemoglobin: 11.1 g/dL — ABNORMAL LOW (ref 12.0–15.0)
RBC: 3.73 MIL/uL — ABNORMAL LOW (ref 3.87–5.11)
WBC: 7.9 10*3/uL (ref 4.0–10.5)

## 2012-05-02 MED ORDER — ONDANSETRON 4 MG PO TBDP
4.0000 mg | ORAL_TABLET | Freq: Four times a day (QID) | ORAL | Status: DC | PRN
  Administered 2012-05-03: 4 mg via ORAL
  Filled 2012-05-02: qty 1

## 2012-05-02 MED ORDER — LAMOTRIGINE ER 200 MG PO TB24: 400.0000 mg | ORAL_TABLET | Freq: Every day | ORAL | Status: DC

## 2012-05-02 MED ORDER — ENOXAPARIN SODIUM 40 MG/0.4ML ~~LOC~~ SOLN
40.0000 mg | Freq: Every day | SUBCUTANEOUS | Status: DC
  Filled 2012-05-02: qty 0.4

## 2012-05-02 MED ORDER — METHOCARBAMOL 500 MG PO TABS
500.0000 mg | ORAL_TABLET | Freq: Three times a day (TID) | ORAL | Status: DC | PRN
  Administered 2012-05-02 – 2012-05-05 (×9): 500 mg via ORAL
  Filled 2012-05-02 (×10): qty 1

## 2012-05-02 MED ORDER — CLINDAMYCIN HCL 150 MG PO CAPS
150.0000 mg | ORAL_CAPSULE | Freq: Three times a day (TID) | ORAL | Status: DC
  Administered 2012-05-02 – 2012-05-05 (×14): 150 mg via ORAL
  Filled 2012-05-02 (×17): qty 1

## 2012-05-02 MED ORDER — LAMOTRIGINE 200 MG PO TABS
200.0000 mg | ORAL_TABLET | Freq: Two times a day (BID) | ORAL | Status: DC
  Administered 2012-05-02 – 2012-05-05 (×7): 200 mg via ORAL
  Filled 2012-05-02 (×9): qty 1

## 2012-05-02 MED ORDER — DICYCLOMINE HCL 20 MG PO TABS
20.0000 mg | ORAL_TABLET | Freq: Four times a day (QID) | ORAL | Status: DC | PRN
  Administered 2012-05-03 – 2012-05-04 (×4): 20 mg via ORAL
  Filled 2012-05-02 (×6): qty 1

## 2012-05-02 MED ORDER — QUETIAPINE FUMARATE 400 MG PO TABS: 400.0000 mg | ORAL_TABLET | Freq: Every day | ORAL | Status: DC

## 2012-05-02 MED ORDER — HYDROXYZINE HCL 25 MG PO TABS
25.0000 mg | ORAL_TABLET | Freq: Four times a day (QID) | ORAL | Status: DC | PRN
  Administered 2012-05-03 – 2012-05-05 (×6): 25 mg via ORAL
  Filled 2012-05-02 (×7): qty 1

## 2012-05-02 MED ORDER — ACETYLCYSTEINE 20 % IN SOLN
5000.0000 mg | RESPIRATORY_TRACT | Status: DC
  Administered 2012-05-02: 5000 mg via ORAL
  Filled 2012-05-02 (×7): qty 30

## 2012-05-02 MED ORDER — ACETYLCYSTEINE 20 % IN SOLN
10000.0000 mg | Freq: Once | RESPIRATORY_TRACT | Status: AC
  Administered 2012-05-02: 10000 mg via ORAL
  Filled 2012-05-02: qty 60

## 2012-05-02 MED ORDER — LORAZEPAM 1 MG PO TABS
1.0000 mg | ORAL_TABLET | ORAL | Status: DC | PRN
  Administered 2012-05-02 – 2012-05-05 (×15): 1 mg via ORAL
  Filled 2012-05-02 (×16): qty 1

## 2012-05-02 MED ORDER — FLUOXETINE HCL 20 MG PO TABS
20.0000 mg | ORAL_TABLET | Freq: Every day | ORAL | Status: DC
  Administered 2012-05-02 – 2012-05-05 (×4): 20 mg via ORAL
  Filled 2012-05-02 (×5): qty 1

## 2012-05-02 MED ORDER — DICYCLOMINE HCL 20 MG PO TABS
20.0000 mg | ORAL_TABLET | Freq: Four times a day (QID) | ORAL | Status: DC | PRN
  Administered 2012-05-02: 20 mg via ORAL
  Filled 2012-05-02: qty 1

## 2012-05-02 MED ORDER — PREGABALIN 50 MG PO CAPS: 100.0000 mg | ORAL_CAPSULE | Freq: Every day | ORAL | Status: DC

## 2012-05-02 NOTE — ED Notes (Signed)
Admitting md at bedside

## 2012-05-02 NOTE — Progress Notes (Signed)
Pharmacy Consult:  NAC for possible APAP toxicity.  Level negative and LFTs only mildly elevated.  Reports taking Dilaudid, but has Rx for Norco and recent admit for APAP OD, so starting NAC. LFTs now decreasing.  APAP level negative    Paged medical resident to see if the team had contacted poison control.  She said no so I called. ( Dr Bebe Shaggy did call them earlier this am and they had file on pt already). Discussed case with [facility].  Dr Sheryle Hail felt was appropriate to dc NAC as APAP level negative and AST/ALT decreasing. Will dc NAC. Thanks for allowing pharmacy to be a part of this patient's care.  Talbert Cage, PharmD Clinical Pharmacist, [phone]

## 2012-05-02 NOTE — Clinical Social Work Psychosocial (Signed)
     Clinical Social Work Department BRIEF PSYCHOSOCIAL ASSESSMENT 05/02/2012  Patient:  Sally Walker,Sally Walker     Account Number:  [id number]     Admit date:  05/01/2012  Clinical Social Worker:  Hulan Fray  Date/Time:  05/02/2012 04:03 PM  Referred by:  Physician  Date Referred:  05/02/2012 Referred for  Other - See comment   Other Referral:   "Patient needs Detox program"   Interview type:  Patient Other interview type:    PSYCHOSOCIAL DATA Living Status:  FACILITY Admitted from facility:  [facility] Level of care:   Primary support name:  Adelene Idler Primary support relationship to patient:  PARENT Degree of support available:   limited    CURRENT CONCERNS Current Concerns  Substance Abuse   Other Concerns:    SOCIAL WORK ASSESSMENT / PLAN Clinical Social Worker received referral for patient regarding detox program. CSW introduced self and explained reason for visit. Patient was agreeable to speak with CSW.    Per patient, she is currently living in [place] because her mother "kicked me out." Patient reported that she has had prior substance use with Crack and Heroin, but that was "years ago." Per patient's report, she has been abusing pain pills since she was 27 years old. Patient reported that she takes "hand fulls 3-6 times per day." Patient reported that she is at the state where she wants to seek treatment for herself, without having anybody else influence her. Patient reported having awareness of how she needs the inpatient detox treatment in order to better herself. Patient reported, "I need to go somewhere today, if I don't go today, then I may not go." CSW informed patient of potential waiting lists for treatment facilities and patient voiced understanding. CSW inquired about a Pharmacist, hospital for substance abuse treatment facilities, and patient already had document in her room.    CSW noticed there is a psych consult as well and await their  recommendations. .   Assessment/plan status:  Psychosocial Support/Ongoing Assessment of Needs Other assessment/ plan:   Information/referral to community resources:   Patient already had list of substance abuse treatment facilities.    PATIENTS/FAMILYS RESPONSE TO PLAN OF CARE: Patient was very open with this CSW. Patient reported that she is willing to accept treatment at an inpatient facility. Patient was appreciative of CSW"s visit.

## 2012-05-02 NOTE — ED Notes (Signed)
Act team at bedside for eval.

## 2012-05-02 NOTE — ED Provider Notes (Signed)
Pt noted to have elevated LFTs She reports using narcotics after recent d/c home.  She is unable to tell me quantity of meds and she is very unreliable.  She report using norco at home.   Her apap level is negative but does have elevation in LFTs and had recent APAP overdose D/w Poison center, recommend starting NAC for APAP overdose and recheck LFTs in 22 hours.  If negative can be discharged Internal medicine service to admit patient   Joya Gaskins, MD 05/02/12 [phone]

## 2012-05-02 NOTE — Consult Note (Signed)
Reason for Consult:substance abuse Referring Physician: Dr. Marylin Crosby  Sally Walker is an 27 y.o. female.  HPI: Patient was seen and chart reviewed. Psychiatrist consultation requested for substance abuse treatment and possible rehab services. Patient was interviewed with her staff RN as per the patient request. She was know to this provider a week ago from psych consult in [facility] ER for similar presentation. She refused substance abuse treatment a that tme. She is willing to get detox and rehab services upon medically cleared. She has been abusing drugs incluidin opiate dependence since the age of 59 presented to [facility] ED for detox and treatment. She was brought by a woman at the homeless shelter. She has been taking Norco 10-325 mg, Dilaudid 2 mg, and stated that she took handful of medication about three days ago to get high. She is having withdrawal symptoms such as chills, increased heart rate, nausea, denies vomiting, cold/hot sensations.   Patient stated that her mother supporting her younger brother and his wife who told that she needs long term drug rehab services and she is willing to obtain because she was not allowed her mothers's home with drug abuse. Patient has been taking Psychiatric medication from her PCP in [place]. She denied symptoms of depression, anxiety, mania and psychosis.   MSE: Patient is awake, alert and oriented x 4, calm, and cooperative. She has fine mood and affect. She has normal speech and thought process. She has denied She has denied SI/HI and no evidence of psychosis.  Past Medical History  Diagnosis Date  . SBO (small bowel obstruction) 2011    "scar tissue rupture on my intestines"   . Diverticulitis   . Depression     Per pt, no history of suicidal ideation  . Seizure disorder     Last seizure in 2012  . Lumbar herniated disc     per patient  . 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 Drug dependence   .  Polysubstance abuse   . Rheumatoid arthritis   . Osteoporosis   . Kidney stones   . Fibromyalgia     Past Surgical History  Procedure Laterality Date  . Total colectomy      Done in [place]  . Cholecystectomy      per patient  . Other surgical history      kidney stone removal     Family History  Problem Relation Age of Onset  . Leukemia Father   . Heart disease      paternal GM, GF  . Fibromyalgia Mother   . Heart disease      maternal GM    Social History:  reports that she has been smoking Cigarettes.  She has a 14 pack-year smoking history. She does not have any smokeless tobacco history on file. She reports that she does not drink alcohol or use illicit drugs.  Allergies:  Allergies  Allergen Reactions  . Adhesive (Tape) Itching  . Fentanyl Other (See Comments)    REACTION:  unknown  . Midodrine Other (See Comments)    REACTION:  unknown  . Morphine And Related Other (See Comments)    REACTION:  unknown  . Toradol (Ketorolac Tromethamine)   . Vancomycin   . Latex Itching and Rash    Medications: I have reviewed the patient's current medications.  Results for orders placed during the hospital encounter of 05/01/12 (from the past 48 hour(s))  ACETAMINOPHEN LEVEL     Status:  None   Collection Time    05/01/12  6:00 PM      Result Value Range   Acetaminophen (Tylenol), Serum <15.0  10 - 30 ug/mL   Comment:            THERAPEUTIC CONCENTRATIONS VARY     SIGNIFICANTLY. A RANGE OF 10-30     ug/mL MAY BE AN EFFECTIVE     CONCENTRATION FOR MANY PATIENTS.     HOWEVER, SOME ARE BEST TREATED     AT CONCENTRATIONS OUTSIDE THIS     RANGE.     ACETAMINOPHEN CONCENTRATIONS     >150 ug/mL AT 4 HOURS AFTER     INGESTION AND >50 ug/mL AT 12     HOURS AFTER INGESTION ARE     OFTEN ASSOCIATED WITH TOXIC     REACTIONS.  CBC     Status: Abnormal   Collection Time    05/01/12  6:00 PM      Result Value Range   WBC 11.7 (*) 4.0 - 10.5 K/uL   RBC 3.82 (*) 3.87 - 5.11  MIL/uL   Hemoglobin 11.0 (*) 12.0 - 15.0 g/dL   HCT 16.1 (*) 09.6 - 04.5 %   MCV 88.2  78.0 - 100.0 fL   MCH 28.8  26.0 - 34.0 pg   MCHC 32.6  30.0 - 36.0 g/dL   RDW 40.9 (*) 81.1 - 91.4 %   Platelets 503 (*) 150 - 400 K/uL  COMPREHENSIVE METABOLIC PANEL     Status: Abnormal   Collection Time    05/01/12  6:00 PM      Result Value Range   Sodium 140  135 - 145 mEq/L   Potassium 4.2  3.5 - 5.1 mEq/L   Chloride 105  96 - 112 mEq/L   CO2 25  19 - 32 mEq/L   Glucose, Bld 99  70 - 99 mg/dL   BUN 10  6 - 23 mg/dL   Creatinine, Ser 7.82  0.50 - 1.10 mg/dL   Calcium 9.5  8.4 - 95.6 mg/dL   Total Protein 7.7  6.0 - 8.3 g/dL   Albumin 3.9  3.5 - 5.2 g/dL   AST 213 (*) 0 - 37 U/L   ALT 88 (*) 0 - 35 U/L   Alkaline Phosphatase 141 (*) 39 - 117 U/L   Total Bilirubin 0.3  0.3 - 1.2 mg/dL   GFR calc non Af Amer >90  >90 mL/min   GFR calc Af Amer >90  >90 mL/min   Comment:            The eGFR has been calculated     using the CKD EPI equation.     This calculation has not been     validated in all clinical     situations.     eGFR's persistently     <90 mL/min signify     possible Chronic Kidney Disease.  ETHANOL     Status: None   Collection Time    05/01/12  6:00 PM      Result Value Range   Alcohol, Ethyl (B) <11  0 - 11 mg/dL   Comment:            LOWEST DETECTABLE LIMIT FOR     SERUM ALCOHOL IS 11 mg/dL     FOR MEDICAL PURPOSES ONLY  SALICYLATE LEVEL     Status: Abnormal   Collection Time    05/01/12  6:00 PM  Result Value Range   Salicylate Lvl <2.0 (*) 2.8 - 20.0 mg/dL  URINE RAPID DRUG SCREEN (HOSP PERFORMED)     Status: Abnormal   Collection Time    05/01/12  6:29 PM      Result Value Range   Opiates POSITIVE (*) NONE DETECTED   Cocaine NONE DETECTED  NONE DETECTED   Benzodiazepines NONE DETECTED  NONE DETECTED   Amphetamines NONE DETECTED  NONE DETECTED   Tetrahydrocannabinol NONE DETECTED  NONE DETECTED   Barbiturates NONE DETECTED  NONE DETECTED   Comment:             DRUG SCREEN FOR MEDICAL PURPOSES     ONLY.  IF CONFIRMATION IS NEEDED     FOR ANY PURPOSE, NOTIFY LAB     WITHIN 5 DAYS.                LOWEST DETECTABLE LIMITS     FOR URINE DRUG SCREEN     Drug Class       Cutoff (ng/mL)     Amphetamine      1000     Barbiturate      200     Benzodiazepine   200     Tricyclics       300     Opiates          300     Cocaine          300     THC              50  URINALYSIS, ROUTINE W REFLEX MICROSCOPIC     Status: Abnormal   Collection Time    05/01/12  6:29 PM      Result Value Range   Color, Urine YELLOW  YELLOW   APPearance CLOUDY (*) CLEAR   Specific Gravity, Urine 1.023  1.005 - 1.030   pH 8.0  5.0 - 8.0   Glucose, UA NEGATIVE  NEGATIVE mg/dL   Hgb urine dipstick LARGE (*) NEGATIVE   Bilirubin Urine NEGATIVE  NEGATIVE   Ketones, ur 15 (*) NEGATIVE mg/dL   Protein, ur NEGATIVE  NEGATIVE mg/dL   Urobilinogen, UA 0.2  0.0 - 1.0 mg/dL   Nitrite NEGATIVE  NEGATIVE   Leukocytes, UA MODERATE (*) NEGATIVE  URINE MICROSCOPIC-ADD ON     Status: Abnormal   Collection Time    05/01/12  6:29 PM      Result Value Range   Squamous Epithelial / LPF MANY (*) RARE   WBC, UA 7-10  <3 WBC/hpf   RBC / HPF 11-20  <3 RBC/hpf   Bacteria, UA FEW (*) RARE   Urine-Other MUCOUS PRESENT    PREGNANCY, URINE     Status: None   Collection Time    05/01/12  6:29 PM      Result Value Range   Preg Test, Ur NEGATIVE  NEGATIVE   Comment:            THE SENSITIVITY OF THIS     METHODOLOGY IS >20 mIU/mL.  COMPREHENSIVE METABOLIC PANEL     Status: Abnormal   Collection Time    05/02/12  6:15 AM      Result Value Range   Sodium 140  135 - 145 mEq/L   Potassium 3.7  3.5 - 5.1 mEq/L   Chloride 108  96 - 112 mEq/L   CO2 21  19 - 32 mEq/L   Glucose, Bld 106 (*) 70 - 99 mg/dL  BUN 7  6 - 23 mg/dL   Creatinine, Ser 0.98  0.50 - 1.10 mg/dL   Calcium 9.0  8.4 - 11.9 mg/dL   Total Protein 6.9  6.0 - 8.3 g/dL   Albumin 3.2 (*) 3.5 - 5.2 g/dL   AST 73 (*)  0 - 37 U/L   ALT 70 (*) 0 - 35 U/L   Alkaline Phosphatase 126 (*) 39 - 117 U/L   Total Bilirubin 0.3  0.3 - 1.2 mg/dL   GFR calc non Af Amer >90  >90 mL/min   GFR calc Af Amer >90  >90 mL/min   Comment:            The eGFR has been calculated     using the CKD EPI equation.     This calculation has not been     validated in all clinical     situations.     eGFR's persistently     <90 mL/min signify     possible Chronic Kidney Disease.  CBC     Status: Abnormal   Collection Time    05/02/12  6:15 AM      Result Value Range   WBC 7.9  4.0 - 10.5 K/uL   RBC 3.73 (*) 3.87 - 5.11 MIL/uL   Hemoglobin 11.1 (*) 12.0 - 15.0 g/dL   HCT 14.7 (*) 82.9 - 56.2 %   MCV 86.6  78.0 - 100.0 fL   MCH 29.8  26.0 - 34.0 pg   MCHC 34.4  30.0 - 36.0 g/dL   RDW 13.0 (*) 86.5 - 78.4 %   Platelets 433 (*) 150 - 400 K/uL  ACETAMINOPHEN LEVEL     Status: None   Collection Time    05/02/12  6:15 AM      Result Value Range   Acetaminophen (Tylenol), Serum <15.0  10 - 30 ug/mL   Comment:            THERAPEUTIC CONCENTRATIONS VARY     SIGNIFICANTLY. A RANGE OF 10-30     ug/mL MAY BE AN EFFECTIVE     CONCENTRATION FOR MANY PATIENTS.     HOWEVER, SOME ARE BEST TREATED     AT CONCENTRATIONS OUTSIDE THIS     RANGE.     ACETAMINOPHEN CONCENTRATIONS     >150 ug/mL AT 4 HOURS AFTER     INGESTION AND >50 ug/mL AT 12     HOURS AFTER INGESTION ARE     OFTEN ASSOCIATED WITH TOXIC     REACTIONS.    Dg Abd Acute W/chest  05/01/2012  *RADIOLOGY REPORT*  Clinical Data: Abdominal pain.  History of colectomy.  ACUTE ABDOMEN SERIES (ABDOMEN 2 VIEW & CHEST 1 VIEW)  Comparison: One-view chest 04/19/2012.  Findings: The heart size is normal.  The lungs are clear.  Supine and upright views the abdomen demonstrate moderate stool on the left side of the colon.  Suture material is present in the right abdomen.  There is no evidence for obstruction or free air. The axial skeleton is within normal limits.  Surgical clips are  noted at the gallbladder fossa.  IMPRESSION:  1.  No acute abnormality of the chest. 2.  Moderate stool in the distal colon. 3.  No acute abnormality of the abdomen.   Original Report Authenticated By: Marin Roberts, M.D.    Dg Humerus Left  05/01/2012  *RADIOLOGY REPORT*  Clinical Data: Swelling.  Pain.  LEFT HUMERUS - 2+ VIEW  Comparison: None.  Findings: The shoulder and elbow joints are intact.  No acute bone or soft tissue abnormality is evident.  The visualized hemithorax is clear.  IMPRESSION: Negative left humerus.   Original Report Authenticated By: Marin Roberts, M.D.     Positive for behavior problems, depression, illegal drug usage, sleep disturbance and tobacco use Blood pressure 115/55, pulse 64, temperature 98.6 F (37 C), temperature source Oral, resp. rate 20, height 5\' 6"  (1.676 m), weight 148 lb 5.9 oz (67.3 kg), last menstrual period 04/11/2012, SpO2 100.00%.   Assessment/Plan: Opioid withdrawal  Opioid dependence Polysubstance abuse verses dependance  Recommedation: Patient will benefit from clonidine protocol for opioid withdrawal symptoms. She is willing to participate in inpatient substance rehab treatment voluntarily upon medically cleared. Patient mother will be supportive of her for treatment.   Furman Trentman,JANARDHAHA R. 05/02/2012, 1:18 PM

## 2012-05-02 NOTE — H&P (Signed)
Internal Medicine Attending Admission Note Date: 05/02/2012  Patient name: Sally Walker Medical record number: [id number] Date of birth: 20-Feb-1985 Age: 27 y.o. Gender: female  I saw and evaluated the patient. I reviewed the resident's note and I agree with the resident's findings and plan as documented in the resident's note.  Chief Complaint(s): Opiate detoxification  History - key components related to admission: 27 year old female with past medical history most significant for diverticulitis, small bowel obstruction, seizure disorder, depression and drug dependence who presented to [facility] with intentions to undergo detoxification. It was initially thought that patient had overdosed on Vicodin and poison control was called. Patient denies any other complaints to me at this time.   She adds that she will end her life as she will have no choice once discharged from here as she cannot continue to live her life in a shelter abusing narcotics and needs help. She denied any active suicidal ideation or homicidal ideation.  15 point review of systems negative except what is noted above.  Past medical history, past surgical history, medications, family history and social history was reviewed and is as per resident's note.    Physical Exam - key components related to admission:  Filed Vitals:   05/02/12 0100 05/02/12 0240 05/02/12 0254 05/02/12 0600  BP:  117/74 118/75 115/55  Pulse:  75 75 64  Temp:  98.3 F (36.8 C) 98.4 F (36.9 C) 98.6 F (37 C)  TempSrc:  Oral Oral Oral  Resp:  20 20 20   Height: 5' 6.14" (1.68 m)  5\' 6"  (1.676 m)   Weight: 149 lb 14.6 oz (68 kg)  148 lb 5.9 oz (67.3 kg)   SpO2:  99% 99% 100%   Physical Exam: General: Vital signs reviewed and noted. Well-developed, well-nourished, in no acute distress; alert, appropriate and cooperative throughout examination.  Head: Normocephalic, atraumatic.  Eyes: PERRL, EOMI, No signs of anemia or jaundince.   Nose: Mucous membranes moist, not inflammed, nonerythematous.  Throat: Oropharynx nonerythematous, no exudate appreciated.   Neck: No deformities, masses, or tenderness noted.Supple, No carotid Bruits, no JVD.  Lungs:  Normal respiratory effort. Clear to auscultation BL without crackles or wheezes.  Heart: RRR. S1 and S2 normal without gallop, murmur, or rubs.  Abdomen:  BS normoactive. Soft, Nondistended, non-tender.  No masses or organomegaly.  Extremities: No pretibial edema.  Neurologic: A&O X3, CN II - XII are grossly intact. Motor strength is 5/5 in the all 4 extremities, Sensations intact to light touch, Cerebellar signs negative.  Skin: No visible rashes    Lab results:   Basic Metabolic Panel:  Recent Labs  86/57/84 1800 05/02/12 0615  NA 140 140  K 4.2 3.7  CL 105 108  CO2 25 21  GLUCOSE 99 106*  BUN 10 7  CREATININE 0.62 0.56  CALCIUM 9.5 9.0   Liver Function Tests:  Recent Labs  05/01/12 1800 05/02/12 0615  AST 107* 73*  ALT 88* 70*  ALKPHOS 141* 126*  BILITOT 0.3 0.3  PROT 7.7 6.9  ALBUMIN 3.9 3.2*   CBC:  Recent Labs  05/01/12 0150  05/01/12 1800 05/02/12 0615  WBC 10.4  --  11.7* 7.9  NEUTROABS 5.5  --   --   --   HGB 11.4*  < > 11.0* 11.1*  HCT 34.8*  < > 33.7* 32.3*  MCV 89.7  --  88.2 86.6  PLT 469*  --  503* 433*  < > = values in this interval not  displayed. Alcohol Level:  Recent Labs  05/01/12 1800  ETH <11   Imaging results:  Dg Abd Acute W/chest  05/01/2012  *RADIOLOGY REPORT*  Clinical Data: Abdominal pain.  History of colectomy.  ACUTE ABDOMEN SERIES (ABDOMEN 2 VIEW & CHEST 1 VIEW)  Comparison: One-view chest 04/19/2012.  Findings: The heart size is normal.  The lungs are clear.  Supine and upright views the abdomen demonstrate moderate stool on the left side of the colon.  Suture material is present in the right abdomen.  There is no evidence for obstruction or free air. The axial skeleton is within normal limits.  Surgical  clips are noted at the gallbladder fossa.  IMPRESSION:  1.  No acute abnormality of the chest. 2.  Moderate stool in the distal colon. 3.  No acute abnormality of the abdomen.   Original Report Authenticated By: Marin Roberts, M.D.    Dg Humerus Left  05/01/2012  *RADIOLOGY REPORT*  Clinical Data: Swelling.  Pain.  LEFT HUMERUS - 2+ VIEW  Comparison: None.  Findings: The shoulder and elbow joints are intact.  No acute bone or soft tissue abnormality is evident.  The visualized hemithorax is clear.  IMPRESSION: Negative left humerus.   Original Report Authenticated By: Marin Roberts, M.D.     Assessment & Plan by Problem:  Principal Problem:   Elevated liver enzymes Active Problems:   Depression   Polysubstance dependence including opioid type drug, continuous use   Left shoulder pain   Tobacco abuse  Patient is a 27 year old female with past medical history most significant for multiple medical problems but most significant for ongoing opiate abuse for which patient is wanting to undergo detoxification. Patient was seen in the ER yesterday but was discharged as she was found to be in appropriate for inpatient rehabilitation.  She presented again on the morning of admission for detoxification and was admitted to our team. Upon reviewing the chart patient also had a consult by psychiatry about 10 days ago for it was recommended that patient does not meet the criteria for acute psychiatric treatment and should be continued on current medical management with referral for inpatient substance abuse rehabilitation treatment program to [place].   Recommendation:  -Patient is medically stable at this time to be discharged safely from a medical point of view as her liver enzymes have trended down(elevated likely secondary to tylenol abuse along with vicodin) -I did not think initially that we needed to consult psychiatry as the most recent consult was only 10 days ago but patient told me  that she plans to end her life as this is her last resort and she does not have any other place to go. -We need to consult psychiatry for suicidal ideation and narcotic abuse detoxification -Patient denies any active suicidal ideation but does say that she will have no choice if she is discharged from here but to end her life.  Continue rest of the medical management as per residents.   Lars Mage MD Faculty-Internal Medicine Residency Program

## 2012-05-02 NOTE — ED Notes (Signed)
Report given to nurse. Pt transported via w/c to floor. nad.

## 2012-05-02 NOTE — BH Assessment (Signed)
Assessment Note   Sally Walker is an 27 y.o. female who presents to [facility] for detox from opiates.  She reports that she was using a handful of 10-325s daily until last week when her mother brought her to the ED for treatment and it was discovered she had elevated tylenol levels and required a medical admission.  She was discharged a couple of days ago and apparently began using again, so she was kicked out of her mother's home.  Sally Walker is a poor historian, has difficulty keeping her facts straight and often gives different answers to the same question.  It is unclear whether this is intentional or a result of her substance use.  She reported that she wanted to get sober so that she could see her nephews again.  She denied current SI, but requested to skip the question about SI over the last month.  WHen promted, she finally answered yes, but denied any plan or intent during that time frame. She states the last time she had a plan she was 17.  She also has a history of SIB but states she hasn't cut in 10 years.  She denies HI or AVH.  At the end of the assessment, Dr Bebe Shaggy came to the patient's room concerned about her elevated liver levels and stated he was going to admit her medically.  No further attention needed from ACT    Axis I: Mood Disorder NOS and Opioid Dependence Axis II: Deferred Axis III:  Past Medical History  Diagnosis Date  . SBO (small bowel obstruction) 2011    "scar tissue rupture on my intestines"   . Diverticulitis   . Depression     Per pt, no history of suicidal ideation  . Seizure disorder     Last seizure in 2012  . Lumbar herniated disc     per patient  . 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 Drug dependence   . Polysubstance abuse    Axis IV: housing problems, problems with access to health care services and problems with primary support group Axis V: 31-40 impairment in reality testing  Past Medical History:  Past Medical  History  Diagnosis Date  . SBO (small bowel obstruction) 2011    "scar tissue rupture on my intestines"   . Diverticulitis   . Depression     Per pt, no history of suicidal ideation  . Seizure disorder     Last seizure in 2012  . Lumbar herniated disc     per patient  . 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 Drug dependence   . Polysubstance abuse     Past Surgical History  Procedure Laterality Date  . Total colectomy      Done in [place]    Family History: No family history on file.  Social History:  reports that she has been smoking Cigarettes.  She has a 14 pack-year smoking history. She does not have any smokeless tobacco history on file. She reports that she does not drink alcohol or use illicit drugs.  Additional Social History:  Alcohol / Drug Use History of alcohol / drug use?: Yes Longest period of sobriety (when/how long): 8 mos Negative Consequences of Use: Personal relationships Substance #1 Name of Substance 1: Vicodin 1 - Age of First Use: 15 1 - Amount (size/oz): a handful of 10-325s throughout the day, at the most 3-4 at a time 1 - Frequency:  daily 1 - Duration: 9 years 1 - Last Use / Amount: 04/30/12   CIWA: CIWA-Ar BP: 125/68 mmHg Pulse Rate: 90 COWS: Clinical Opiate Withdrawal Scale (COWS) Resting Pulse Rate: Pulse Rate 81-100 Sweating: Subjective report of chills or flushing Restlessness: Reports difficulty sitting still, but is able to do so Pupil Size: Pupils moderately dilated Bone or Joint Aches: Mild diffuse discomfort Runny Nose or Tearing: Nasal stuffiness or unusually moist eyes GI Upset: nausea or loose stool Tremor: Tremor can be felt, but not observed Yawning: No yawning Anxiety or Irritability: Patient reports increasing irritability or anxiousness Gooseflesh Skin: Skin is smooth COWS Total Score: 11  Allergies:  Allergies  Allergen Reactions  . Adhesive (Tape) Itching  . Fentanyl Other (See Comments)     REACTION:  unknown  . Midodrine Other (See Comments)    REACTION:  unknown  . Morphine And Related Other (See Comments)    REACTION:  unknown  . Toradol (Ketorolac Tromethamine)   . Vancomycin   . Latex Itching and Rash    Home Medications:  (Not in a hospital admission)  OB/GYN Status:  Patient's last menstrual period was 04/11/2012.  General Assessment Data Location of Assessment: [facility] ED Living Arrangements: Other (Comment) (shelter) Can pt return to current living arrangement?: Yes Admission Status: Voluntary Is patient capable of signing voluntary admission?: Yes Transfer from: Acute Hospital Referral Source: Self/Family/Friend  Education Status Is patient currently in school?: No Highest grade of school patient has completed: 9  Risk to self Suicidal Ideation: No-Not Currently/Within Last 6 Months Suicidal Intent: No Is patient at risk for suicide?: No Suicidal Plan?: No Access to Means: No What has been your use of drugs/alcohol within the last 12 months?: ongoing Previous Attempts/Gestures: No How many times?: 0 Intentional Self Injurious Behavior: Cutting Comment - Self Injurious Behavior: history of cutting years ago Family Suicide History: No Recent stressful life event(s): Recent negative physical changes;Turmoil (Comment) (unable to see nephews) Persecutory voices/beliefs?: No Depression: Yes Depression Symptoms: Feeling angry/irritable;Feeling worthless/self pity;Guilt;Loss of interest in usual pleasures;Isolating;Tearfulness;Insomnia;Fatigue Substance abuse history and/or treatment for substance abuse?: Yes Suicide prevention information given to non-admitted patients: Yes  Risk to Others Homicidal Ideation: No Thoughts of Harm to Others: No Current Homicidal Intent: No Current Homicidal Plan: No Access to Homicidal Means: No History of harm to others?: No Assessment of Violence: None Noted Does patient have access to weapons?: No Criminal Charges  Pending?: No Does patient have a court date: No  Psychosis Hallucinations: None noted Delusions: None noted  Mental Status Report Appear/Hygiene: Disheveled Eye Contact: Fair Motor Activity: Restlessness Speech: Slurred Level of Consciousness: Alert Mood: Ambivalent Affect: Appropriate to circumstance Anxiety Level: None Thought Processes: Coherent;Relevant Judgement: Impaired Orientation: Person;Place;Time;Situation Obsessive Compulsive Thoughts/Behaviors: None  Cognitive Functioning Concentration: Normal Memory: Recent Impaired;Remote Intact IQ: Average Insight: Fair Impulse Control: Poor Appetite: Fair Weight Loss: 0 Weight Gain: 0 Sleep: Decreased Total Hours of Sleep: 2 Vegetative Symptoms: None  ADLScreening [facility] Assessment Services) Patient's cognitive ability adequate to safely complete daily activities?: Yes Patient able to express need for assistance with ADLs?: Yes Independently performs ADLs?: Yes (appropriate for developmental age)  Abuse/Neglect [facility]) Physical Abuse: Denies Verbal Abuse: Denies Sexual Abuse: Denies  Prior Inpatient Therapy Prior Inpatient Therapy: Yes Prior Therapy Dates: last week Prior Therapy Facilty/Provider(s): [facility] Reason for Treatment: overdose/detox  Prior Outpatient Therapy Prior Outpatient Therapy: No  ADL Screening (condition at time of admission) Patient's cognitive ability adequate to safely complete daily activities?: Yes Patient able to express  need for assistance with ADLs?: Yes Independently performs ADLs?: Yes (appropriate for developmental age) Weakness of Legs: None Weakness of Arms/Hands: None       Abuse/Neglect Assessment (Assessment to be complete while patient is alone) Physical Abuse: Denies Verbal Abuse: Denies Sexual Abuse: Denies Exploitation of patient/patient's resources: Denies Self-Neglect: Denies     Merchant navy officer (For Healthcare) Advance Directive: Patient does not have  advance directive Nutrition Screen- [facility] Adult/WL/AP Patient's home diet: Regular Have you recently lost weight without trying?: No Have you been eating poorly because of a decreased appetite?: No Malnutrition Screening Tool Score: 0  Additional Information 1:1 In Past 12 Months?: No CIRT Risk: No Elopement Risk: No Does patient have medical clearance?: Yes     Disposition:  Disposition Initial Assessment Completed for this Encounter: Yes Disposition of Patient: Inpatient treatment program Type of inpatient treatment program: Adult  On Site Evaluation by:  Bebe Shaggy Reviewed with Physician:  Liam Rogers Marlana Latus 05/02/2012 1:44 AM

## 2012-05-02 NOTE — H&P (Signed)
Hospital Admission Note Date: 05/02/2012  Patient name: Sally Walker Medical record number: [id number] Date of birth: Nov 22, 1985 Age: 27 y.o. Gender: female PCP: Rolm Gala MD  Medical Service:Internal Medicine Attending physician: Dr. Eben Burow     1st Contact: Cheryl Flash [phone] 2nd Contact: Dr. Loistine Chance (only 4/10) Dr. Dorise Hiss Pager: [phone], [phone] After 5 pm or weekends: 1st Contact: Pager: [phone] 2nd Contact: Pager: [phone]  Chief Complaint: Opiate detox   History of Present Illness: 27 y.o with opiate dependence since the age of 80 presented to [facility] ED for detox.  She was brought by a woman at the homeless shelter.  She has been taking Norco 10-325 mg, Dilaudid 2 mg, Tylenol and Advil though she will not quantify how much and the quantity is unclear.  She is having withdrawal symptoms such as chills, increased heart rate, nausea, denies vomiting, cold/hot sensations.  In the ED her transaminases were elevated and she needs medical clearance prior to further treatment for opiate addiction.    Meds: Prescriptions prior to admission  Medication Sig Dispense Refill  . clindamycin (CLEOCIN) 150 MG capsule Take 1 capsule (150 mg total) by mouth every 6 (six) hours.  28 capsule  0  . FLUoxetine (PROZAC) 20 MG tablet Take 20 mg by mouth daily.      Marland Kitchen HYDROcodone-acetaminophen (NORCO/VICODIN) 5-325 MG per tablet Take 2 tablets by mouth every 4 (four) hours as needed for pain.  10 tablet  0  . LamoTRIgine 200 MG TB24 Take 400 mg by mouth at bedtime.      . pregabalin (LYRICA) 100 MG capsule Take 100 mg by mouth daily.      . QUEtiapine (SEROQUEL) 400 MG tablet Take 400 mg by mouth at bedtime.       Allergies: Allergies as of 05/01/2012 - Review Complete 05/01/2012  Allergen Reaction Noted  . Adhesive (tape) Itching 04/19/2012  . Fentanyl Other (See Comments) 04/19/2012  . Midodrine Other (See Comments) 04/19/2012  . Morphine and related Other (See Comments) 04/19/2012   . Toradol (ketorolac tromethamine)  04/30/2012  . Vancomycin  04/30/2012  . Latex Itching and Rash 04/19/2012   Past Medical History  Diagnosis Date  . SBO (small bowel obstruction) 2011    "scar tissue rupture on my intestines"   . Diverticulitis   . Depression     Per pt, no history of suicidal ideation  . Seizure disorder     Last seizure in 2012  . Lumbar herniated disc     per patient  . 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 Drug dependence   . Polysubstance abuse   . Rheumatoid arthritis   . Osteoporosis   . Kidney stones   . Fibromyalgia    Past Surgical History  Procedure Laterality Date  . Total colectomy      Done in [place]  . Cholecystectomy      per patient  . Other surgical history      kidney stone removal    Family History  Problem Relation Age of Onset  . Leukemia Father   . Heart disease      paternal GM, GF  . Fibromyalgia Mother   . Heart disease      maternal GM   History   Social History  . Marital Status: Single    Spouse Name: N/A    Number of Children: N/A  . Years of Education: N/A   Occupational History  . Not on  file.   Social History Main Topics  . Smoking status: Current Every Day Smoker -- 1.00 packs/day for 14 years    Types: Cigarettes  . Smokeless tobacco: Not on file  . Alcohol Use: No  . Drug Use: No     Comment: Stopped Crystal Meth/Crack in 2007  . Sexually Active: No   Other Topics Concern  . Not on file   Social History Narrative   Homeless.  Mother lives in [place] but kicked out of house due to drug abuse   Smoking 1 ppd (smoking since age 63)    Opiate abuse since age 33.  History of crack use, marijuana and heroin.  Denies IV drug use    9th grade education    Moved from [facility]    Unemployed currently getting disability    Review of Systems: General: +cold/hot sensations  CV: denies chest pain Lungs: denies sob Abdomen/GU: +RUQ abdominal pain intermittent 8-9/10  radiates to right back sharp, stabbing, denies dysuria, denies constipation Extremities: denies swelling to extremities  Skin:  +wound to right foot  MSK:  +left shoulder pain Psych: withdrawal symptoms such as chills, increased HR, nausea, denies vomiting, cold/hot sensations; denies SI  Physical Exam: Blood pressure 118/75, pulse 75, temperature 98.4 F (36.9 C), temperature source Oral, resp. rate 20, height 5\' 6"  (1.676 m), weight 148 lb 5.9 oz (67.3 kg), last menstrual period 04/11/2012, SpO2 99.00%. General: lying in bed, nad, alert and oriented x 3 HEENT: /at, perrl b/l  CV: RRR no murmurs  Lungs: ctab Abdomen: soft, normal bs, min. ttp RUQ, nd, multiple surgical scars to abdomen. No CVA ttp Extremities: right dorsal foot with ulceration, no erythema or pus MSK: left shoulder joint tender to palpation Neuro: alert and oriented x 3; moving all 4 extremities   Lab results: Basic Metabolic Panel:  Recent Labs  45/40/98 0158 05/01/12 1800  NA 143 140  K 3.7 4.2  CL 106 105  CO2  --  25  GLUCOSE 95 99  BUN 12 10  CREATININE 0.80 0.62  CALCIUM  --  9.5   Liver Function Tests:  Recent Labs  05/01/12 1800  AST 107*  ALT 88*  ALKPHOS 141*  BILITOT 0.3  PROT 7.7  ALBUMIN 3.9   CBC:  Recent Labs  05/01/12 0150 05/01/12 0158 05/01/12 1800  WBC 10.4  --  11.7*  NEUTROABS 5.5  --   --   HGB 11.4* 12.2 11.0*  HCT 34.8* 36.0 33.7*  MCV 89.7  --  88.2  PLT 469*  --  503*   Urine Drug Screen: Drugs of Abuse     Component Value Date/Time   LABOPIA POSITIVE* 05/01/2012 1829   COCAINSCRNUR NONE DETECTED 05/01/2012 1829   LABBENZ NONE DETECTED 05/01/2012 1829   AMPHETMU NONE DETECTED 05/01/2012 1829   THCU NONE DETECTED 05/01/2012 1829   LABBARB NONE DETECTED 05/01/2012 1829    Alcohol Level:  Recent Labs  05/01/12 1800  ETH <11   Urinalysis:  Recent Labs  05/01/12 1829  COLORURINE YELLOW  LABSPEC 1.023  PHURINE 8.0  GLUCOSEU NEGATIVE  HGBUR LARGE*   BILIRUBINUR NEGATIVE  KETONESUR 15*  PROTEINUR NEGATIVE  UROBILINOGEN 0.2  NITRITE NEGATIVE  LEUKOCYTESUR MODERATE*   Misc. Labs: Hepatitis panel Acetaminophen level   Imaging results:  Dg Abd Acute W/chest  05/01/2012  *RADIOLOGY REPORT*  Clinical Data: Abdominal pain.  History of colectomy.  ACUTE ABDOMEN SERIES (ABDOMEN 2 VIEW & CHEST 1 VIEW)  Comparison:  One-view chest 04/19/2012.  Findings: The heart size is normal.  The lungs are clear.  Supine and upright views the abdomen demonstrate moderate stool on the left side of the colon.  Suture material is present in the right abdomen.  There is no evidence for obstruction or free air. The axial skeleton is within normal limits.  Surgical clips are noted at the gallbladder fossa.  IMPRESSION:  1.  No acute abnormality of the chest. 2.  Moderate stool in the distal colon. 3.  No acute abnormality of the abdomen.   Original Report Authenticated By: Marin Roberts, M.D.    Dg Humerus Left  05/01/2012  *RADIOLOGY REPORT*  Clinical Data: Swelling.  Pain.  LEFT HUMERUS - 2+ VIEW  Comparison: None.  Findings: The shoulder and elbow joints are intact.  No acute bone or soft tissue abnormality is evident.  The visualized hemithorax is clear.  IMPRESSION: Negative left humerus.   Original Report Authenticated By: Marin Roberts, M.D.     Other results: EKG: none   Assessment & Plan by Problem: 26 y.o with opiate addiction since age 16 she presented for detox and incidentally was found to have elevated transaminases, suspected urinary tract infection.  She needs medical clearance prior to further treatment for opiate addiction.    1. Elevated transaminases  -Etiology likely due to opiate abuse.  Will perform hepatitis panel.   -AP 141, AST 107, ALT 88.  Salicylate level <2.0.  Acetaminophen level <15.0. Alcohol <11.  UDS +opiates -Per Poison control giving NAC -will trend CMET, Acetaminophen level in am, hepatitis panel in the am    2.  Polysubstance dependence including opioid type drug, continuous use and Tobacco abuse  -Consult placed to ACT team in the ED  -Clinical opiate withdrawal score q 4 hours prn  -Consider Clonidine for withdrawal symptoms, prn Ativan 1 mg q8, prn Zofran  -Patient has to be medically cleared before transfer to [facility] or other facility for opiate dependence help.  She was previously referred to a inpatient rehab center in [place] -Nicotine patch  -At last discharge it was recommended not to prescribe opiates in the future   3. Suspected UTI -UA cloudy, 15 ketones, moderate leukocytes, large hemoglobin, mucous present, 7-10 WBC, 11-20 RBC, many squamous epithelial cells, few bacterial  -She was given a dose of Rocephin in the ED  -Pending urine culture to follow   4. Leukocytosis  -Etiology could be due to acute stress, suspected UTI  -CBC in the am   5. Normocytic anemia  -CBC in the am   6. History of Depression -Recent admission for drug overdose 04/19/12.  History of suicide attempts though currently denies -Resumed Prozac 20 mg qd, Lamictal 200 mg bid.  According to last discharge summary she was supposed to stop Seroquel  7. Right foot possible cellulitis  -Continued Cleocin 150 mg tid.  This was prescribed 04/30/12 at [facility] ED though she had not filled the medication  8. Left shoulder pain  -imaging negative.  Unclear etiology denies trauma   9. F/E/N -will monitor and correct electrolytes as needed  -NPO  10. DVT px  -Lovenox     Dispo: Disposition is deferred at this time, awaiting improvement of current medical problems. Anticipated discharge in approximately 1-2 day(s).   The patient does have a current PCP Gavin Potters, HEIDI, MD), therefore will not requiring OPC follow-up after discharge.   The patient does have transportation limitations that hinder transportation to clinic appointments.  Signed:  Annett Gula [phone] 05/02/2012, 4:11 AM

## 2012-05-02 NOTE — Progress Notes (Signed)
Medical Student Daily Progress Note  Subjective: Patient is continuing to have withdrawal symptoms of chills, hot/cold sensation, increased heart rate, and diffuse abdominal pain that has not improved. She notes continued numbness of her 2nd, 3rd and 4th toes of her L foot due to her cellulitis. Patient also complains of dysuria ongoing for 1.5 weeks now, and she saw cloudy urine this morning.   Patient voices that she is motivated and serious about getting help for her substance abuse. She has been dependent on opiates since age 23.   Objective: Vital signs in last 24 hours: Filed Vitals:   05/02/12 0100 05/02/12 0240 05/02/12 0254 05/02/12 0600  BP:  117/74 118/75 115/55  Pulse:  75 75 64  Temp:  98.3 F (36.8 C) 98.4 F (36.9 C) 98.6 F (37 C)  TempSrc:  Oral Oral Oral  Resp:  20 20 20   Height: 5' 6.14" (1.68 m)  5\' 6"  (1.676 m)   Weight: 68 kg (149 lb 14.6 oz)  67.3 kg (148 lb 5.9 oz)   SpO2:  99% 99% 100%   Weight change:  No intake or output data in the 24 hours ending 05/02/12 1202  Physical Exam: General: alert, cooperative, lying in bed in mild distress Lungs: clear to ascultation bilaterally, normal work of respiration, no wheezes, rales, ronchi Heart: regular rate and rhythm, no murmurs, gallops, or rubs Abdomen: soft, diffusely tender, non-distended, decreased bowel sounds Extremities: small wound with scab on dorsal aspect of L foot above 2nd and 3rd toes with surrounding erythema Neurologic: alert & oriented X3, cranial nerves II-XII grossly intact, strength grossly intact  Lab Results: Basic Metabolic Panel:  Recent Labs Lab 05/01/12 1800 05/02/12 0615  NA 140 140  K 4.2 3.7  CL 105 108  CO2 25 21  GLUCOSE 99 106*  BUN 10 7  CREATININE 0.62 0.56  CALCIUM 9.5 9.0   Liver Function Tests:  Recent Labs Lab 05/01/12 1800 05/02/12 0615  AST 107* 73*  ALT 88* 70*  ALKPHOS 141* 126*  BILITOT 0.3 0.3  PROT 7.7 6.9  ALBUMIN 3.9 3.2*   No results  found for this basename: LIPASE, AMYLASE,  in the last 168 hours No results found for this basename: AMMONIA,  in the last 168 hours CBC:  Recent Labs Lab 05/01/12 0150  05/01/12 1800 05/02/12 0615  WBC 10.4  --  11.7* 7.9  NEUTROABS 5.5  --   --   --   HGB 11.4*  < > 11.0* 11.1*  HCT 34.8*  < > 33.7* 32.3*  MCV 89.7  --  88.2 86.6  PLT 469*  --  503* 433*  < > = values in this interval not displayed.  Urine Drug Screen: Drugs of Abuse     Component Value Date/Time   LABOPIA POSITIVE* 05/01/2012 1829   COCAINSCRNUR NONE DETECTED 05/01/2012 1829   LABBENZ NONE DETECTED 05/01/2012 1829   AMPHETMU NONE DETECTED 05/01/2012 1829   THCU NONE DETECTED 05/01/2012 1829   LABBARB NONE DETECTED 05/01/2012 1829    Acetaminophen: 05/01/12  1800   <15.0 05/02/12  0615   <15.0  Alcohol Level:  Recent Labs Lab 05/01/12 1800  ETH <11   Urinalysis:  Recent Labs Lab 05/01/12 1829  COLORURINE YELLOW  LABSPEC 1.023  PHURINE 8.0  GLUCOSEU NEGATIVE  HGBUR LARGE*  BILIRUBINUR NEGATIVE  KETONESUR 15*  PROTEINUR NEGATIVE  UROBILINOGEN 0.2  NITRITE NEGATIVE  LEUKOCYTESUR MODERATE*   Micro Results: No results found for this or any  previous visit (from the past 240 hour(s)).  Studies/Results: Dg Abd Acute W/chest  05/01/2012  *RADIOLOGY REPORT*  Clinical Data: Abdominal pain.  History of colectomy.  ACUTE ABDOMEN SERIES (ABDOMEN 2 VIEW & CHEST 1 VIEW)  Comparison: One-view chest 04/19/2012.  Findings: The heart size is normal.  The lungs are clear.  Supine and upright views the abdomen demonstrate moderate stool on the left side of the colon.  Suture material is present in the right abdomen.  There is no evidence for obstruction or free air. The axial skeleton is within normal limits.  Surgical clips are noted at the gallbladder fossa.  IMPRESSION:  1.  No acute abnormality of the chest. 2.  Moderate stool in the distal colon. 3.  No acute abnormality of the abdomen.   Original Report Authenticated  By: Marin Roberts, M.D.    Dg Humerus Left  05/01/2012  *RADIOLOGY REPORT*  Clinical Data: Swelling.  Pain.  LEFT HUMERUS - 2+ VIEW  Comparison: None.  Findings: The shoulder and elbow joints are intact.  No acute bone or soft tissue abnormality is evident.  The visualized hemithorax is clear.  IMPRESSION: Negative left humerus.   Original Report Authenticated By: Marin Roberts, M.D.    Medications: I have reviewed the patient's current medications. Scheduled Meds: . clindamycin  150 mg Oral TID AC & HS  . enoxaparin (LOVENOX) injection  40 mg Subcutaneous Daily  . FLUoxetine  20 mg Oral Daily  . lamoTRIgine  200 mg Oral BID  . nicotine  21 mg Transdermal Daily   Continuous Infusions:  PRN Meds:.alum & mag hydroxide-simeth, ibuprofen, LORazepam, ondansetron  Assessment/Plan:  1. Elevated liver enzymes: Upon admission, patient's transaminases were elevated. Trended APAP, all values insignificant. Poison control administered one time N-acetylcysteine, now discontinued. Urine tox screen positive for opiates. -Trend transaminases -Hepatitis panel pending  2. Polysubstance dependence: Urine tox screen positive for opiates. ACT team was consulted and she received list of rehab resources.  -Opiate withdrawal scores q4hrs -Ativan 1mg  q8hrs, Zofran prn for withdrawal sx -Hold clonidine for now, borderline low BP -Psych consult pending -Patient has to be medically cleared before transfer to [facility] or other facility for opiate dependence help. She was previously referred to an inpatient rehab facility in [place], denied as too medically complex -At last discharge, it was recommended not to prescribe opiates in the future  3. Possible UTI: UA showed moderate leukocytes, neg nitrites, dirty catch with many squams. Patient symptomatic with pain on urination for 1.5 weeks. -Rocephin 1mg , one time in ED -Urine cx pending  4. Hx depression: She currently denies suicidal  ideation, although has hx suicide attempts. -Resumed Prozac 20mg  qday, Lamictal 200mg  bid. According to last discharge summary, she was to stop Seroquel. -Psych consult pending  5. Left shoulder pain: Chronic shoulder pain of unknown etiology. Xray of the humerus was unremarkable. Patient has known fibromyalgia. -Ibuprofen 600mg  q8hrs for pain  6. Tobacco abuse: Patient was open to nicotine patch. -Nicotine patch  7. Possible R foot cellulitis -Start Clindamycin 150mg  tid (prescribed at [facility] on 04/30/12, although she did not fill rx)  8. F/E/N -Clear liquid diet  9. DVT ppx -Lovenox  Dispo: Disposition is deferred at this time, awaiting psych consult and trending down of transaminases. Anticipated discharge in approximately 1 day.   The patient does have a current PCP (Rolm Gala, MD), therefore will be requiring PCP follow-up after discharge.   The patient does not have transportation limitations that hinder transportation to  clinic appointments.   LOS: 1 day   This is a Psychologist, occupational Note.  The care of the patient was discussed with Dr. Loistine Chance and the assessment and plan formulated with their assistance.  Please see their attached note for official documentation of the daily encounter.  Cheryl Flash 05/02/2012, 12:02 PM   Please refer to my H and P note from today for details regarding the assessment and plan.  Lars Mage MD Faculty-Internal Medicine Residency Program

## 2012-05-03 LAB — HEPATIC FUNCTION PANEL
Albumin: 3.4 g/dL — ABNORMAL LOW (ref 3.5–5.2)
Total Protein: 7 g/dL (ref 6.0–8.3)

## 2012-05-03 LAB — HEPATITIS PANEL, ACUTE
HCV Ab: NEGATIVE
Hep A IgM: NEGATIVE
Hep B C IgM: NEGATIVE
Hepatitis B Surface Ag: NEGATIVE

## 2012-05-03 LAB — BASIC METABOLIC PANEL
Calcium: 9.2 mg/dL (ref 8.4–10.5)
Creatinine, Ser: 0.71 mg/dL (ref 0.50–1.10)
GFR calc non Af Amer: >90 mL/min (ref >90)
Sodium: 138 mEq/L (ref 135–145)

## 2012-05-03 LAB — URINE CULTURE

## 2012-05-03 MED ORDER — DICYCLOMINE HCL 20 MG PO TABS: 20.0000 mg | ORAL_TABLET | Freq: Four times a day (QID) | ORAL | Status: DC | PRN

## 2012-05-03 MED ORDER — SODIUM CHLORIDE 0.9 % IV SOLN
INTRAVENOUS | Status: DC
  Administered 2012-05-03 – 2012-05-05 (×4): via INTRAVENOUS

## 2012-05-03 MED ORDER — IBUPROFEN 600 MG PO TABS: 600.0000 mg | ORAL_TABLET | Freq: Three times a day (TID) | ORAL | Status: DC | PRN

## 2012-05-03 MED ORDER — KETOROLAC TROMETHAMINE 15 MG/ML IJ SOLN
15.0000 mg | Freq: Three times a day (TID) | INTRAMUSCULAR | Status: DC | PRN
  Administered 2012-05-03 – 2012-05-05 (×5): 15 mg via INTRAVENOUS
  Filled 2012-05-03 (×5): qty 1

## 2012-05-03 MED ORDER — CLONIDINE HCL 0.1 MG PO TABS
0.1000 mg | ORAL_TABLET | Freq: Three times a day (TID) | ORAL | Status: AC
  Administered 2012-05-03 – 2012-05-04 (×3): 0.1 mg via ORAL
  Filled 2012-05-03 (×3): qty 1

## 2012-05-03 NOTE — Clinical Social Work Psych Note (Signed)
Psych CSW met with pt at bedside.  Psych CSW advised pt that she would need to call daily to inquire if tx beds are available.  Once a bed is available, pt would need to complete phone assessment prior to admission.  Pt acknowledged understanding.  Psych CSW will provide bus pass for pt to return to [facility].  Vickii Penna, LCSWA [phone]  Clinical Social Work

## 2012-05-03 NOTE — ED Provider Notes (Signed)
Medical screening examination/treatment/procedure(s) were conducted as a shared visit with non-physician practitioner(s) and myself.  I personally evaluated the patient during the encounter   Joya Gaskins, MD 05/03/12 1616

## 2012-05-03 NOTE — Progress Notes (Signed)
Internal Medicine Teaching Service Attending Note Date: 05/03/2012  Patient name: Sally Walker  Medical record number: [id number]  Date of birth: 07-08-1985    This patient has been seen and discussed with the house staff. Please see their note for complete details. I concur with their findings with the following additions/corrections: Patient is much more shaky as compared to yesterday. She was crying in the room when I went to see her. She hasn't been able to keep her breakfast and lunch. She has thrown up once which contained food particles. She is also complaining of abdominal pain which is 9/10, present in the middle of the abdomen, no radiation, similar to the pain that she has had in the past. BP 116/57  Pulse 89  Temp(Src) 98.8 F (37.1 C) (Oral)  Resp 18  Ht 5\' 6"  (1.676 m)  Wt 148 lb 5.9 oz (67.3 kg)  BMI 23.96 kg/m2  SpO2 100%  LMP 04/11/2012 Physical exam is unremarkable except abdominal tenderness throughout. No guarding or rigidity noted, no organomegaly and bowel sounds are appropriate.  Assessment and plan: -Discontinue plan for discharge today -Start IV fluids normal saline at 100 cc an hour -IV Toradol 15-30 mg every 6-8 hours for pain control -Clonidine protocol for opiate withdrawal -Continue oral lorazepam  Patient is most likely undergoing opiate withdrawal at this time. We will continue above measures and will continue to monitor her here. If patient's vital signs show signs of sympathetic overactivity like high blood pressure, tachycardia, fevers or excessive sweating, we may consider transferring her to step down or ICU at that time for closer monitoring.  Lars Mage 05/03/2012, 2:16 PM

## 2012-05-03 NOTE — Progress Notes (Signed)
Called by nurse for evaluation of withdraw symptoms   Subjective: Patient reports that she is withdrawing. She noted that she feels sick on her stomach and just not right. She was not able to eat her diet  this am and  Lunch due to nausea even after taking Zofran  Objective: Vital signs in last 24 hours: Filed Vitals:   05/03/12 0229 05/03/12 0526 05/03/12 1302 05/03/12 1348  BP: 108/56 136/75 105/73 116/57  Pulse: 86 117 78 89  Temp: 98.3 F (36.8 C) 98.7 F (37.1 C)  98.8 F (37.1 C)  TempSrc: Oral Oral    Resp: 18 20  18   Height:      Weight:      SpO2: 100% 100%  100%   Physical Exam: Constitutional: Vital signs reviewed.  Patient is a well-developed and well-nourished  in no acute distress and cooperative with exam. Alert and oriented x3.  Cardiovascular: RRR, S1 normal, S2 normal, no MRG, pulses symmetric and intact bilaterally Pulmonary/Chest: CTAB, no wheezes, rales, or rhonchi Abdominal: Soft.  Mild tenderness on palpation, non-distended, bowel sounds are normal, Musculoskeletal: No joint deformities, erythema, or stiffness, ROM full and no nontender Hematology: no cervical, inginal, or axillary adenopathy.  Neurological: A&O x3, Strenght is normal and symmetric bilaterally,  Psychiatric: Anxious appearing  Lab Results: Reviewed and documented in Electronic Record Micro Results: Reviewed and documented in Electronic Record Studies/Results: Reviewed and documented in Electronic Record Medications: I have reviewed the patient's current medications. Scheduled Meds: . clindamycin  150 mg Oral TID AC & HS  . FLUoxetine  20 mg Oral Daily  . lamoTRIgine  200 mg Oral BID  . nicotine  21 mg Transdermal Daily   Continuous Infusions:  PRN Meds:.alum & mag hydroxide-simeth, dicyclomine, hydrOXYzine, ibuprofen, LORazepam, methocarbamol, ondansetron, ondansetron Assessment/Plan:  Patient most likely is not experiencing withdraw symptoms which includes Yawning, tremor,  increaed bowel sounds , hypertension, tachycardia and tachypnea ( Hypotension with vomiting and diarrhea) . She seems to be very anxious and restless. Nevertheless patient is not able to tolerate po due to nausea . Will start IV fluids, change diet to as tolerated. For pain control Toradol ( patient tolerated toradol during this admission well ).Discussed with Dr Eben Burow.     LOS: 2 days   Vonda Harth 05/03/2012, 2:06 PM

## 2012-05-03 NOTE — Discharge Summary (Signed)
Internal Medicine Teaching [facility] Discharge Note  Name: Sally Walker MRN: [id number] DOB: 1985/12/24 26 y.o.  Date of Admission: 05/01/2012  6:01 PM Date of Discharge: 05/03/2012 Attending Physician: Lars Mage, MD  Discharge Diagnosis: Principal Problem:   Elevated liver enzymes Active Problems:   Depression   Polysubstance dependence including opioid type drug, continuous use   Left shoulder pain   Tobacco abuse   Discharge Medications:   Medication List    STOP taking these medications       HYDROcodone-acetaminophen 5-325 MG per tablet  Commonly known as:  NORCO/VICODIN     QUEtiapine 400 MG tablet  Commonly known as:  SEROQUEL      TAKE these medications       clindamycin 150 MG capsule  Commonly known as:  CLEOCIN  Take 1 capsule (150 mg total) by mouth every 6 (six) hours.     dicyclomine 20 MG tablet  Commonly known as:  BENTYL  Take 1 tablet (20 mg total) by mouth every 6 (six) hours as needed (abdominal cramping).     FLUoxetine 20 MG tablet  Commonly known as:  PROZAC  Take 20 mg by mouth daily.     LamoTRIgine 200 MG Tb24  Take 400 mg by mouth at bedtime.     pregabalin 100 MG capsule  Commonly known as:  LYRICA  Take 100 mg by mouth daily.        Disposition and follow-up:   Sally Walker was discharged from [facility] in Stable condition.  At the hospital follow up visit please address opiate cessation.  Follow-up Appointments:     Follow-up Information   Schedule an appointment as soon as possible for a visit with Rolm Gala, MD.   Contact information:   [address] [place] [address] [phone]      Discharge Orders   Future Orders Complete By Expires     Activity as tolerated - No restrictions  As directed     Call MD for:  persistant nausea and vomiting  As directed     Call MD for:  temperature >100.4  As directed     Diet general  As directed        Consultations: Treatment Team:  Nehemiah Settle, MD  Procedures Performed:  Dg Chest Port 1 View  04/19/2012  *RADIOLOGY REPORT*  Clinical Data: Drug overdose  PORTABLE CHEST - 1 VIEW  Comparison: None.  Findings: The cardiac shadow is within normal limits.  Poor inspiratory effort is noted with crowding of vascular markings. Some very mild vascular congestion is noted.  IMPRESSION: Mild vascular congestion. Poor inspiratory effort.   Original Report Authenticated By: Alcide Clever, M.D.    Dg Abd Acute W/chest  05/01/2012  *RADIOLOGY REPORT*  Clinical Data: Abdominal pain.  History of colectomy.  ACUTE ABDOMEN SERIES (ABDOMEN 2 VIEW & CHEST 1 VIEW)  Comparison: One-view chest 04/19/2012.  Findings: The heart size is normal.  The lungs are clear.  Supine and upright views the abdomen demonstrate moderate stool on the left side of the colon.  Suture material is present in the right abdomen.  There is no evidence for obstruction or free air. The axial skeleton is within normal limits.  Surgical clips are noted at the gallbladder fossa.  IMPRESSION:  1.  No acute abnormality of the chest. 2.  Moderate stool in the distal colon. 3.  No acute abnormality of the abdomen.   Original  Report Authenticated By: Marin Roberts, M.D.    Dg Humerus Left  05/01/2012  *RADIOLOGY REPORT*  Clinical Data: Swelling.  Pain.  LEFT HUMERUS - 2+ VIEW  Comparison: None.  Findings: The shoulder and elbow joints are intact.  No acute bone or soft tissue abnormality is evident.  The visualized hemithorax is clear.  IMPRESSION: Negative left humerus.   Original Report Authenticated By: Marin Roberts, M.D.    Admission HPI:  27 y.o with opiate dependence since the age of 14 presented to [facility] ED for detox. She was brought by a woman at the homeless shelter. She has been taking Norco 10-325 mg, Dilaudid 2 mg, Tylenol and Advil though she will not quantify how much and the quantity is unclear. She is  having withdrawal symptoms such as chills, increased heart rate, nausea, denies vomiting, cold/hot sensations. In the ED her transaminases were elevated and she needs medical clearance prior to further treatment for opiate addiction.   Hospital Course by problem list:  Polysubstance dependence including opioid type drug - She did have 5 days sobriety in the hospital. Her withdrawal symptoms were managed with clonidine, ativan PO, hydroxyzine, bentyl. She did have some abdominal pain which resolved over the course and diarrhea which decreased in frequency during the stay. She also had some decreased appetite which is continuing to resolve although she did have full meals prior to discharge. She does have estrangement with her mother and is unable to return to that home. She will be discharged either to homeless shelter or to inpatient rehab program. Her mother did come to visit her in the hospital. Per CSW [facility] is unable to take [place] and was unable to accept this patient. She is still following up with [facility] for availability of rehab beds upon discharge. She was given bus pass to get back to homeless shelter.   Elevated liver enzymes - Minimal elevation (<2 times upper limit normal) and did trend down within a day of hospital stay. She did have negative hepatitis panel during this stay.   Depression - She does have flat affect and was maintained on her home regimen of lamictal and lyrica although question compliance. Multiple stressors in her life including substance abuse, homelessness, and estrangement with her mother over history of use in the home. She is unable to return home.   Chronic pain - She was maintained on toradol and then ibuprofen for her chronic pain and was reasonably well controlled on this regimen.   Tobacco abuse -  She was maintained on nicotine patch while here in the hospital and did not smoke.  She was encouraged to maintain this cessation and given tips for success.  Previously she was 1 PPD smoker times 10 years    Discharge Vitals:  BP 136/75  Pulse 117  Temp(Src) 98.7 F (37.1 C) (Oral)  Resp 20  Ht 5\' 6"  (1.676 m)  Wt 148 lb 5.9 oz (67.3 kg)  BMI 23.96 kg/m2  SpO2 100%  LMP 04/11/2012  Discharge Labs:  Results for orders placed during the hospital encounter of 05/01/12 (from the past 24 hour(s))  BASIC METABOLIC PANEL     Status: Abnormal   Collection Time    05/03/12  5:17 AM      Result Value Range   Sodium 138  135 - 145 mEq/L   Potassium 3.4 (*) 3.5 - 5.1 mEq/L   Chloride 105  96 - 112 mEq/L   CO2 23  19 - 32 mEq/L  Glucose, Bld 99  70 - 99 mg/dL   BUN 6  6 - 23 mg/dL   Creatinine, Ser 1.61  0.50 - 1.10 mg/dL   Calcium 9.2  8.4 - 09.6 mg/dL   GFR calc non Af Amer >90  >90 mL/min   GFR calc Af Amer >90  >90 mL/min  HEPATIC FUNCTION PANEL     Status: Abnormal   Collection Time    05/03/12  5:17 AM      Result Value Range   Total Protein 7.0  6.0 - 8.3 g/dL   Albumin 3.4 (*) 3.5 - 5.2 g/dL   AST 40 (*) 0 - 37 U/L   ALT 57 (*) 0 - 35 U/L   Alkaline Phosphatase 128 (*) 39 - 117 U/L   Total Bilirubin 0.3  0.3 - 1.2 mg/dL   Bilirubin, Direct <0.4  0.0 - 0.3 mg/dL   Indirect Bilirubin NOT CALCULATED  0.3 - 0.9 mg/dL    Signed: Genella Mech 05/03/2012, 10:55 AM   Time Spent on Discharge: 35 minutes Services Ordered on Discharge: none Equipment Ordered on Discharge: none

## 2012-05-03 NOTE — Clinical Social Work Psych Note (Signed)
Psych CSW made referrals for pt to [facility], [facility] and [facility].  [facility] does not have treatment program for opiate abuse.  [facility] and [facility] does not currently have beds available.  There is currently a wait list at these facilities.  Psych CSW made pt aware.  Psych CSW also made pt aware that once medically stable, the pt would be d/c home with resources and it would be pt responsibility to f/u on her own.  Pt was given inpatient treatment resources, outpatient treatment resources as well as long-term treatment resources.  Pt acknowledged understanding.  Psych CSW will report to MD and RN.  Vickii Penna, LCSWA [phone]  Clinical Social Work

## 2012-05-03 NOTE — Clinical Social Work Psych Assess (Signed)
Clinical Social Work Department CLINICAL SOCIAL WORK PSYCHIATRY SERVICE LINE ASSESSMENT 05/03/2012  Patient:  Sally Walker  Account:  [id number]  Admit Date:  05/01/2012  Clinical Social Worker:  Read Drivers  Date/Time:  05/03/2012 10:02 AM Referred by:  RN  Date referred:  05/03/2012 Reason for Referral  Substance Abuse   Presenting Symptoms/Problems (In the person's/family's own words):   "I want inpatient tx"   Abuse/Neglect/Trauma History (check all that apply)  Denies history   Abuse/Neglect/Trauma Comments:   none reported or noted   Psychiatric History (check all that apply)  Denies history   Psychiatric medications:  none noted or reported   Current Mental Health Hospitalizations/Previous Mental Health History:   none reported or noted.  Pt reports getting Rx from her PCP   Current provider:   no current psychiatric provider noted or reported   Place and Date:   none reported or noted   Current Medications:   Scheduled Meds:      . clindamycin  150 mg Oral TID AC & HS  . FLUoxetine  20 mg Oral Daily  . lamoTRIgine  200 mg Oral BID  . nicotine  21 mg Transdermal Daily        Continuous Infusions:      PRN Meds:.alum & mag hydroxide-simeth, dicyclomine, hydrOXYzine, ibuprofen, LORazepam, methocarbamol, ondansetron, ondansetron       Previous Impatient Admission/Date/Reason:   none reported or noted   Emotional Health / Current Symptoms    Suicide/Self Harm  None reported   Suicide attempt in the past:   none reported or noted   Other harmful behavior:   none reported or noted   Psychotic/Dissociative Symptoms  None reported   Other Psychotic/Dissociative Symptoms:   none reported or noted    Attention/Behavioral Symptoms  Restless   Other Attention / Behavioral Symptoms:   none reported or noted    Cognitive Impairment  Orientation - Time  Orientation - Situation  Orientation - Self  Orientation - Place  Poor Judgement   Poor/Impaired Decision-Making   Other Cognitive Impairment:   no others reported or noted    Mood and Adjustment  Mood Congruent    Stress, Anxiety, Trauma, Any Recent Loss/Stressor  Anxiety   Anxiety (frequency):   moderate - pt actively withdrawing from opiates   Phobia (specify):   none reported or noted   Compulsive behavior (specify):   none reported or noted   Obsessive behavior (specify):   none reported or noted   Other:   none reported or noted   Substance Abuse/Use  Current substance use  Substance abuse treatment needed  History of substance use   SBIRT completed (please refer for detailed history):  N  Self-reported substance use:   pt admitted for OBS here at [facility] on Wednesday.  Pt admits using on Wednesday prior to admission.  Pt states she injests opiates: Perocets and Hydrocodone by the "fist fulls" on a daily basis 5-6 times per day.  Pt has been actively using drugs since the age of 39. Pt is requesting inpatient psychiatric treament.   Urinary Drug Screen Completed:  Y Alcohol level:   none detected    Environmental/Housing/Living Arrangement  HOMELESS   Who is in the home:   pt is from the [facility]   Emergency contact:  Leanne Lovely [phone]   Financial  Medicaid  Medicare   Patient's Strengths and Goals (patient's own words):   Pt is asking for help.  Pt has family  who sets boundaries re: drug abuse.  Pt is hopeful and seems to be healthy otherwise.   Clinical Social Worker's Interpretive Summary:   CSW assessed pt at bedside.  Pt was alert and oriented x4. Pt denies SI/HI.  Pt denies AVHD.  Pt requests detox/treatment for opiate abuse.  Psych CSW called [facility], [facility] and [facility] to look for inpatient bed.  [facility] does not have a program for opiate treatment.  [facility] and [facility] both are full and are currently only accepting patients to the waiting list.  Pysch CSW provided pt with a list of resources with phone numbers included to treatment  centers, long term treatment facilities as well as outpatient resources.  Pt was appreciative of CSW attention and resources.  Pt does not seem willing to seek treatment on her own.  Psych CSW advised pt that once she is medically cleared she would be d/c that we cannot hold her here until a treatment bed becomes available as this may take some time.  Pt acknowledged understanding and frustration of this process. Pt has been evaluated by and cleared by the psychiatrist.  Psych CSW will contact attending to give report.   Disposition:  Psych Clinical Social Worker signing off  Vickii Penna, [place] [phone]  Clinical Social Work

## 2012-05-04 MED ORDER — POTASSIUM CHLORIDE CRYS ER 20 MEQ PO TBCR
40.0000 meq | EXTENDED_RELEASE_TABLET | Freq: Two times a day (BID) | ORAL | Status: AC
  Administered 2012-05-04 (×2): 40 meq via ORAL
  Filled 2012-05-04 (×2): qty 2

## 2012-05-04 MED ORDER — WHITE PETROLATUM GEL
Status: AC
  Filled 2012-05-04: qty 5

## 2012-05-04 NOTE — Progress Notes (Signed)
Internal Medicine Teaching Service Attending Note Date: 05/04/2012  Patient name: Sally Walker  Medical record number: [id number]  Date of birth: May 20, 1985    This patient has been seen and discussed with the house staff. Please see their note for complete details. I concur with their findings with the following additions/corrections: Patient looked much more calm today. She had little sleep last night as compared to yesterday when she could not sleep. She required minimal use of withdrawal medications. Patient says that she threw up once today and couldn't tolerate breakfast. She was able to keep the clear liquids down.  Plan is to contact her mom today and request her to take back to her house until she is able to get a place in rehab. No changes in medications. I encouraged her to keep drinking and eating. We will continue to use supportive medications like zofran.  Lars Mage 05/04/2012, 8:52 AM

## 2012-05-04 NOTE — Progress Notes (Signed)
Medical Student Daily Progress Note  Subjective: Patient continues to endorse chills, hot/cold sensation, abdominal pain, headaches and anxiety. She has been unable to take po for 2 days now due to nausea. She is receiving Zofran prn for her nausea.  Objective: Vital signs in last 24 hours:  05/03/12 1724 05/03/12 2146  BP: 100/54 107/60  Pulse: 68 61  Temp: 98.4 F (36.9 C) 98.6 F (37 C)  TempSrc: Oral Oral  Resp: 18 18  Height:    Weight:    SpO2: 98% 97%   Weight change:   Intake/Output Summary (Last 24 hours) at 05/04/12 0858 Last data filed at 05/04/12 0519  Gross per 24 hour  Intake 1003.33 ml  Output   1100 ml  Net -96.67 ml   Physical Exam: General: alert, cooperative, lying in bed in mild distress Lungs: clear to ascultation bilaterally, normal work of respiration, no wheezes, rales, ronchi Heart: regular rate and rhythm, no murmurs, gallops, or rubs Abdomen: soft, diffusely tender, non-distended, decreased bowel sounds  Extremities: small wound with scab on dorsal aspect of L foot, minimal surrounding erythema Neurologic: alert & oriented X3, cranial nerves II-XII grossly intact, strength grossly intact   Lab Results: Basic Metabolic Panel:  Recent Labs Lab 05/02/12 0615 05/03/12 0517  NA 140 138  K 3.7 3.4*  CL 108 105  CO2 21 23  GLUCOSE 106* 99  BUN 7 6  CREATININE 0.56 0.71  CALCIUM 9.0 9.2   Liver Function Tests:  Recent Labs Lab 05/02/12 0615 05/03/12 0517  AST 73* 40*  ALT 70* 57*  ALKPHOS 126* 128*  BILITOT 0.3 0.3  PROT 6.9 7.0  ALBUMIN 3.2* 3.4*   No results found for this basename: LIPASE, AMYLASE,  in the last 168 hours No results found for this basename: AMMONIA,  in the last 168 hours CBC:  Recent Labs Lab 05/01/12 0150  05/01/12 1800 05/02/12 0615  WBC 10.4  --  11.7* 7.9  NEUTROABS 5.5  --   --   --   HGB 11.4*  < > 11.0* 11.1*  HCT 34.8*  < > 33.7* 32.3*  MCV 89.7  --  88.2 86.6  PLT 469*  --  503* 433*  < > =  values in this interval not displayed. Cardiac Enzymes: No results found for this basename: CKTOTAL, CKMB, CKMBINDEX, TROPONINI,  in the last 168 hours BNP: No results found for this basename: PROBNP,  in the last 168 hours D-Dimer: No results found for this basename: DDIMER,  in the last 168 hours CBG: No results found for this basename: GLUCAP,  in the last 168 hours Hemoglobin A1C: No results found for this basename: HGBA1C,  in the last 168 hours Fasting Lipid Panel: No results found for this basename: CHOL, HDL, LDLCALC, TRIG, CHOLHDL, LDLDIRECT,  in the last 168 hours Thyroid Function Tests: No results found for this basename: TSH, T4TOTAL, FREET4, T3FREE, THYROIDAB,  in the last 168 hours Coagulation: No results found for this basename: LABPROT, INR,  in the last 168 hours Anemia Panel: No results found for this basename: VITAMINB12, FOLATE, FERRITIN, TIBC, IRON, RETICCTPCT,  in the last 168 hours Urine Drug Screen: Drugs of Abuse     Component Value Date/Time   LABOPIA POSITIVE* 05/01/2012 1829   COCAINSCRNUR NONE DETECTED 05/01/2012 1829   LABBENZ NONE DETECTED 05/01/2012 1829   AMPHETMU NONE DETECTED 05/01/2012 1829   THCU NONE DETECTED 05/01/2012 1829   LABBARB NONE DETECTED 05/01/2012 1829    Alcohol  Level:  Recent Labs Lab 05/01/12 1800  ETH <11   Urinalysis:  Recent Labs Lab 05/01/12 1829  COLORURINE YELLOW  LABSPEC 1.023  PHURINE 8.0  GLUCOSEU NEGATIVE  HGBUR LARGE*  BILIRUBINUR NEGATIVE  KETONESUR 15*  PROTEINUR NEGATIVE  UROBILINOGEN 0.2  NITRITE NEGATIVE  LEUKOCYTESUR MODERATE*   Micro Results: Recent Results (from the past 240 hour(s))  URINE CULTURE     Status: None   Collection Time    05/01/12  6:29 PM      Result Value Range Status   Specimen Description URINE, RANDOM   Final   Special Requests NONE   Final   Culture  Setup Time 05/02/2012 02:02   Final   Colony Count 85,000 COLONIES/ML   Final   Culture     Final   Value: Multiple bacterial  morphotypes present, none predominant. Suggest appropriate recollection if clinically indicated.   Report Status 05/03/2012 FINAL   Final   Studies/Results: No results found. Medications: I have reviewed the patient's current medications. Scheduled Meds: . clindamycin  150 mg Oral TID AC & HS  . cloNIDine  0.1 mg Oral TID  . FLUoxetine  20 mg Oral Daily  . lamoTRIgine  200 mg Oral BID  . nicotine  21 mg Transdermal Daily   Continuous Infusions: . sodium chloride 100 mL/hr at 05/04/12 0223   PRN Meds:.alum & mag hydroxide-simeth, dicyclomine, hydrOXYzine, [START ON 05/08/2012] ibuprofen, ketorolac, LORazepam, methocarbamol, ondansetron, ondansetron  Assessment/Plan: Patient is a 27 yo with opiate dependence since the age of 64 who presented to [facility] ED for detox. Elevated transaminases were noted on admission  1. Elevated liver enzymes, resolved: Upon admission, patient's transaminases were elevated. Trended APAP, all values insignificant. Poison control administered one time N-acetylcysteine, now discontinued. Urine tox screen positive for opiates.  -Hepatitis panel negative   2. Polysubstance dependence: Urine tox screen positive for opiates. ACT team was consulted and she received list of rehab resources.  -Opiate withdrawal scores q4hrs  -Ativan 1mg  q8hrs, Zofran prn for withdrawal sx  -Psych consult recommends voluntary inpatient rehab  -Patient been medically cleared before transfer to [facility] or other facility for opiate dependence treatment. Unfortunately, no available beds at this time.  -At last discharge, it was recommended not to prescribe opiates in the future   3. r/o UTI: UA showed moderate leukocytes, neg nitrites, dirty catch with many squams. Patient symptomatic with pain on urination for 1.5 weeks.  -Rocephin 1mg , one time in ED  -Urine cx negative   4. Hx depression: She currently denies suicidal ideation, although has hx suicide attempts.   -Resumed Prozac 20mg  qday, Lamictal 200mg  bid. According to last discharge summary, she was to stop Seroquel.   5. Left shoulder pain: Chronic shoulder pain of unknown etiology. Xray of the humerus was unremarkable. Patient has known fibromyalgia.  -Ibuprofen 600mg  q8hrs for pain   6. Tobacco abuse: Patient was open to nicotine patch.  -Nicotine patch   7. Possible R foot cellulitis  -Start Clindamycin 150mg  tid (prescribed at [facility] on 04/30/12, although she did not fill rx)   8. F/E/N  -Normal diet   9. DVT ppx  -Lovenox   Dispo: Disposition is deferred at this time, awaiting patient able to eat and take po. Anticipated discharge in approximately 1-2 days. Per social work, patient will receive bus pass to return to homeless shelter upon discharge.  The patient does have a current PCP (Rolm Gala, MD), therefore will be requiring PCP follow-up after  discharge.   The patient does not have transportation limitations that hinder transportation to clinic appointments.   LOS: 2 days   This is a Psychologist, occupational Note.  The care of the patient was discussed with Dr. Loistine Chance and the assessment and plan formulated with their assistance.  Please see their attached note for official documentation of the daily encounter.  Cheryl Flash 05/03/2012

## 2012-05-04 NOTE — Progress Notes (Signed)
Medical Student Daily Progress Note  Subjective: Patient has been nauseous and said she vomited 4-5 times, undocumented by nursing. She continues to have abdominal pain, chills, and headaches. Also, patient said she had diarrhea twice and noticed blood in her stools this morning. Patient was able to eat 3 french fries yesterday.   Patient called inpatient facilities yesterday and continues to be motivated to pursue rehab.  Objective: Vital signs in last 24 hours: Filed Vitals:   05/03/12 1724 05/03/12 2146 05/04/12 0233 05/04/12 0509  BP: 100/54 107/60 111/66 108/96  Pulse: 68 61 66 65  Temp: 98.4 F (36.9 C) 98.6 F (37 C) 98.4 F (36.9 C) 99.1 F (37.3 C)  TempSrc: Oral Oral Oral Oral  Resp: 18 18 18 16   Height:      Weight:      SpO2: 98% 97% 97% 99%   Weight change:   Intake/Output Summary (Last 24 hours) at 05/04/12 0844 Last data filed at 05/04/12 0519  Gross per 24 hour  Intake 1003.33 ml  Output   1100 ml  Net -96.67 ml   Physical Exam: General: alert, cooperative, lying in bed in mild distress Lungs: clear to ascultation bilaterally, normal work of respiration, no wheezes, rales, ronchi Heart: regular rate and rhythm, no murmurs, gallops, or rubs Abdomen: soft, diffusely tender, non-distended, decreased bowel sounds  Extremities: small wound with scab on dorsal aspect of L foot, minimal surrounding erythema Neurologic: alert & oriented X3, cranial nerves II-XII grossly intact, strength grossly intact  Lab Results: Basic Metabolic Panel:  Recent Labs Lab 05/02/12 0615 05/03/12 0517  NA 140 138  K 3.7 3.4*  CL 108 105  CO2 21 23  GLUCOSE 106* 99  BUN 7 6  CREATININE 0.56 0.71  CALCIUM 9.0 9.2   Liver Function Tests:  Recent Labs Lab 05/02/12 0615 05/03/12 0517  AST 73* 40*  ALT 70* 57*  ALKPHOS 126* 128*  BILITOT 0.3 0.3  PROT 6.9 7.0  ALBUMIN 3.2* 3.4*   No results found for this basename: LIPASE, AMYLASE,  in the last 168 hours No  results found for this basename: AMMONIA,  in the last 168 hours CBC:  Recent Labs Lab 05/01/12 0150  05/01/12 1800 05/02/12 0615  WBC 10.4  --  11.7* 7.9  NEUTROABS 5.5  --   --   --   HGB 11.4*  < > 11.0* 11.1*  HCT 34.8*  < > 33.7* 32.3*  MCV 89.7  --  88.2 86.6  PLT 469*  --  503* 433*  < > = values in this interval not displayed. Cardiac Enzymes: No results found for this basename: CKTOTAL, CKMB, CKMBINDEX, TROPONINI,  in the last 168 hours BNP: No results found for this basename: PROBNP,  in the last 168 hours D-Dimer: No results found for this basename: DDIMER,  in the last 168 hours CBG: No results found for this basename: GLUCAP,  in the last 168 hours Hemoglobin A1C: No results found for this basename: HGBA1C,  in the last 168 hours Fasting Lipid Panel: No results found for this basename: CHOL, HDL, LDLCALC, TRIG, CHOLHDL, LDLDIRECT,  in the last 168 hours Thyroid Function Tests: No results found for this basename: TSH, T4TOTAL, FREET4, T3FREE, THYROIDAB,  in the last 168 hours Coagulation: No results found for this basename: LABPROT, INR,  in the last 168 hours Anemia Panel: No results found for this basename: VITAMINB12, FOLATE, FERRITIN, TIBC, IRON, RETICCTPCT,  in the last 168 hours Urine Drug Screen:  Drugs of Abuse     Component Value Date/Time   LABOPIA POSITIVE* 05/01/2012 1829   COCAINSCRNUR NONE DETECTED 05/01/2012 1829   LABBENZ NONE DETECTED 05/01/2012 1829   AMPHETMU NONE DETECTED 05/01/2012 1829   THCU NONE DETECTED 05/01/2012 1829   LABBARB NONE DETECTED 05/01/2012 1829    Alcohol Level:  Recent Labs Lab 05/01/12 1800  ETH <11   Urinalysis:  Recent Labs Lab 05/01/12 1829  COLORURINE YELLOW  LABSPEC 1.023  PHURINE 8.0  GLUCOSEU NEGATIVE  HGBUR LARGE*  BILIRUBINUR NEGATIVE  KETONESUR 15*  PROTEINUR NEGATIVE  UROBILINOGEN 0.2  NITRITE NEGATIVE  LEUKOCYTESUR MODERATE*   Micro Results: Recent Results (from the past 240 hour(s))  URINE  CULTURE     Status: None   Collection Time    05/01/12  6:29 PM      Result Value Range Status   Specimen Description URINE, RANDOM   Final   Special Requests NONE   Final   Culture  Setup Time 05/02/2012 02:02   Final   Colony Count 85,000 COLONIES/ML   Final   Culture     Final   Value: Multiple bacterial morphotypes present, none predominant. Suggest appropriate recollection if clinically indicated.   Report Status 05/03/2012 FINAL   Final   Studies/Results: No results found. Medications: I have reviewed the patient's current medications. Scheduled Meds: . clindamycin  150 mg Oral TID AC & HS  . cloNIDine  0.1 mg Oral TID  . FLUoxetine  20 mg Oral Daily  . lamoTRIgine  200 mg Oral BID  . nicotine  21 mg Transdermal Daily   Continuous Infusions: . sodium chloride 100 mL/hr at 05/04/12 0223   PRN Meds:.alum & mag hydroxide-simeth, dicyclomine, hydrOXYzine, [START ON 05/08/2012] ibuprofen, ketorolac, LORazepam, methocarbamol, ondansetron, ondansetron  Assessment/Plan: Patient is a 27 yo with opiate dependence since the age of 58 who presented to [facility] ED for detox. Elevated transaminases were noted on admission.   1. Elevated liver enzymes, resolved: Upon admission, patient's transaminases were elevated. Trended APAP, all values insignificant. Poison control administered one time N-acetylcysteine, now discontinued. Urine tox screen positive for opiates.  -Hepatitis panel negative  2. Polysubstance dependence: Urine tox screen positive for opiates. ACT team was consulted and she received list of rehab resources.  -Opiate withdrawal scores q4hrs  -Ativan 1mg  q8hrs, Zofran prn for withdrawal sx  -Psych consult recommends voluntary inpatient rehab -Patient been medically cleared before transfer to [facility] or other facility for opiate dependence treatment. Unfortunately, no beds are available at this time. -At last discharge, it was recommended not to prescribe opiates  in the future   3. r/o UTI: UA showed moderate leukocytes, neg nitrites, dirty catch with many squams. Patient symptomatic with pain on urination for 1.5 weeks.  -Rocephin 1mg , one time in ED  -Urine cx negative  4. Hx depression: She currently denies suicidal ideation, although has hx suicide attempts.  -Resumed Prozac 20mg  qday, Lamictal 200mg  bid. According to last discharge summary, she was to stop Seroquel.   5. Left shoulder pain: Chronic shoulder pain of unknown etiology. Xray of the humerus was unremarkable. Patient has known fibromyalgia.  -Ibuprofen 600mg  q8hrs for pain   6. Tobacco abuse: Patient was open to nicotine patch.  -Nicotine patch   7. Possible R foot cellulitis:  -Start Clindamycin 150mg  tid (prescribed at [facility] on 04/30/12, although she did not fill rx)   8. F/E/N  -Normal diet  9. DVT ppx  -Lovenox   Dispo: Disposition  is deferred at this time, awaiting ability of patient to eat and take po. Anticipated discharge tomorrow to homeless shelter. We spoke with the mother, Cardell Peach, who states patient is not able to stay in her home.  The patient does have a current PCP (Rolm Gala, MD), therefore will be requiring PCP follow-up after discharge.   The patient does not have transportation limitations that hinder transportation to clinic appointments.   LOS: 3 days   This is a Psychologist, occupational Note.  The care of the patient was discussed with Dr. Dorise Hiss and the assessment and plan formulated with their assistance.  Please see their attached note for official documentation of the daily encounter.  Cheryl Flash 05/04/2012, 8:44 AM

## 2012-05-04 NOTE — Progress Notes (Signed)
Resident Co-sign Daily Note:  I have seen the patient and reviewed the daily progress note by Cheryl Flash MS 4 and discussed the care of the patient with them. See below for documentation of my findings, assessment, and plans.   Subjective:  The patient was doing well today until she heard the plan that she was going home. She was unable to tolerate breakfast and is having minimal pain and tenderness in her abdomen.   Objective:  Vital signs in last 24 hours:  Filed Vitals:    05/03/12 1724  05/03/12 2146  05/04/12 0233  05/04/12 0509   BP:  100/54  107/60  111/66  108/96   Pulse:  68  61  66  65   Temp:  98.4 F (36.9 C)  98.6 F (37 C)  98.4 F (36.9 C)  99.1 F (37.3 C)   TempSrc:  Oral  Oral  Oral  Oral   Resp:  18  18  18  16    Height:       Weight:       SpO2:  98%  97%  97%  99%    Physical Exam:  General: resting in bed, some anxiety, normally conversant, slightly flat affect  HEENT: PERRL, EOMI, no scleral icterus  Cardiac: RRR, no rubs, murmurs or gallops  Pulm: clear to auscultation bilaterally, moving normal volumes of air  Abd: soft, mild diffuse tenderness, nondistended, BS present  Ext: warm and well perfused, no pedal edema  Neuro: alert and oriented X3, cranial nerves II-XII grossly intact  Lab Results:  Reviewed and documented in Electronic Record  Micro Results:  Reviewed and documented in Electronic Record  Studies/Results:  Reviewed and documented in Electronic Record  Medications: I have reviewed the patient's current medications.  Scheduled Meds:  .  clindamycin  150 mg  Oral  TID AC & HS   .  FLUoxetine  20 mg  Oral  Daily   .  lamoTRIgine  200 mg  Oral  BID   .  nicotine  21 mg  Transdermal  Daily    Continuous Infusions:  .  sodium chloride  100 mL/hr at 05/04/12 0223    PRN Meds:.alum & mag hydroxide-simeth, dicyclomine, hydrOXYzine, [START ON 05/08/2012] ibuprofen, ketorolac, LORazepam, methocarbamol, ondansetron, ondansetron  Assessment/Plan:   Elevated liver enzymes - Resolved and not following.   Depression - She is on fluoxetine and lamictal. Unclear compliance prior to hospital stay. Some flat affect but may be appropriate given her situation of homelessness and withdrawal of opiates.   Polysubstance dependence including opioid type drug - She is currently having mild opiate withdrawal symptoms including some anxiety, poor appetite. See later progress note for full details as this did change throughout the day.   Tobacco abuse - Advised continued cessation and is on nicotine patch.   Disposition - Likely D/C to shelter soon. Continue to call daily for update on rehab waiting list status.   LOS: 2 days  Genella Mech

## 2012-05-04 NOTE — Progress Notes (Signed)
Resident Co-sign Daily Note: I have seen the patient and reviewed the daily progress note by Cheryl Flash MS 4 and discussed the care of the patient with them.  See below for documentation of my findings, assessment, and plans.  Subjective: The patient is doing okay this morning although states her appetite is still poor. She is still having some anxiety over where she can go when she leaves the hospital. She did mention that her mother's house may be a place she can stay and that she has been talking to her mother. She is not having fevers or chills. She is having some abdominal aching which also goes to her back slightly. She states that she is not having any chest pain or SOB. She is having 1 episode of vomiting of some stomach acid last night but none this morning. She did eat some of her dinner last night. She stated she is still having one episode of loose stools this morning and only 3 yesterday all day.   Objective: Vital signs in last 24 hours: Filed Vitals:   05/03/12 1724 05/03/12 2146 05/04/12 0233 05/04/12 0509  BP: 100/54 107/60 111/66 108/96  Pulse: 68 61 66 65  Temp: 98.4 F (36.9 C) 98.6 F (37 C) 98.4 F (36.9 C) 99.1 F (37.3 C)  TempSrc: Oral Oral Oral Oral  Resp: 18 18 18 16   Height:      Weight:      SpO2: 98% 97% 97% 99%   Physical Exam: General: resting in bed, some anxiety, normally conversant, slightly flat affect HEENT: PERRL, EOMI, no scleral icterus Cardiac: RRR, no rubs, murmurs or gallops Pulm: clear to auscultation bilaterally, moving normal volumes of air Abd: soft, mild diffuse tenderness, nondistended, BS present Ext: warm and well perfused, no pedal edema Neuro: alert and oriented X3, cranial nerves II-XII grossly intact  Lab Results: Reviewed and documented in Electronic Record Micro Results: Reviewed and documented in Electronic Record Studies/Results: Reviewed and documented in Electronic Record  Medications: I have reviewed the patient's  current medications. Scheduled Meds: . clindamycin  150 mg Oral TID AC & HS  . FLUoxetine  20 mg Oral Daily  . lamoTRIgine  200 mg Oral BID  . nicotine  21 mg Transdermal Daily   Continuous Infusions: . sodium chloride 100 mL/hr at 05/04/12 0223   PRN Meds:.alum & mag hydroxide-simeth, dicyclomine, hydrOXYzine, [START ON 05/08/2012] ibuprofen, ketorolac, LORazepam, methocarbamol, ondansetron, ondansetron Assessment/Plan:  Elevated liver enzymes  - Resolved and not following.  Depression - She is on fluoxetine and lamictal. Unclear compliance prior to hospital stay. Some flat affect but may be appropriate given her situation of homelessness and withdrawal of opiates.   Polysubstance dependence including opioid type drug - She is currently having mild opiate withdrawal symptoms including some anxiety, poor appetite. Her diarrhea is slowing down and stomach pain seems to be improving today. Will watch to see if she is able to maintain some food today and likely discharge back to shelter soon. Did call and speak with her mother and she was quite clear that the patient can not go home. It seems that she told the patient this and is planning to come visit today.   Hypokalemia - Very mild decrease and will give some Kdur today. Likely from poor PO intake and will recheck tomorrow.   Tobacco abuse - Advised continued cessation and is on nicotine patch.  Disposition - Likely D/C to shelter soon. Continue to call daily for update on rehab waiting  list status.    LOS: 3 days   Genella Mech 05/04/2012, 10:29 AM

## 2012-05-05 LAB — BASIC METABOLIC PANEL
Calcium: 8.8 mg/dL (ref 8.4–10.5)
GFR calc Af Amer: >90 mL/min (ref >90)
GFR calc non Af Amer: >90 mL/min (ref >90)
Potassium: 3.5 mEq/L (ref 3.5–5.1)
Sodium: 138 mEq/L (ref 135–145)

## 2012-05-05 MED ORDER — HYDROXYZINE HCL 25 MG PO TABS: 25.0000 mg | ORAL_TABLET | Freq: Four times a day (QID) | ORAL | Status: DC | PRN

## 2012-05-05 MED ORDER — LORAZEPAM 0.5 MG PO TABS
0.5000 mg | ORAL_TABLET | ORAL | Status: DC | PRN
  Administered 2012-05-05 (×2): 0.5 mg via ORAL
  Filled 2012-05-05 (×2): qty 1

## 2012-05-05 NOTE — Progress Notes (Signed)
Subjective: The patient is doing well this morning although she did not sleep that well. She did manage to eat dinner and breakfast yesterday and her withdrawal scores have declined significantly. She is not having nausea but states her appetite is poor. She is having a lot of cravings to use and states that she is thinking about going to get some opiates. She then requests to go have cigarette outside for "just 10 minutes". She states her mom stopped by yesterday and confirms that she cannot go live there. She states that she called [place] [facility] and they told her she has a bed on Monday if the hospital calls.   Objective: Vital signs in last 24 hours: Filed Vitals:   05/04/12 0509 05/04/12 1322 05/04/12 2125 05/05/12 0546  BP: 108/96 124/72 98/58 107/63  Pulse: 65 92 64 62  Temp: 99.1 F (37.3 C) 98.8 F (37.1 C) 98.6 F (37 C) 98.5 F (36.9 C)  TempSrc: Oral  Oral   Resp: 16 20 19 18   Height:      Weight:      SpO2: 99% 98% 100% 100%   Weight change:   Intake/Output Summary (Last 24 hours) at 05/05/12 0833 Last data filed at 05/05/12 0520  Gross per 24 hour  Intake   3798 ml  Output    500 ml  Net   3298 ml   Physical Exam: General: resting in bed, some anxiety, normally conversant, slightly flat affect  HEENT: PERRL, EOMI, no scleral icterus  Cardiac: RRR, no rubs, murmurs or gallops  Pulm: clear to auscultation bilaterally, moving normal volumes of air  Abd: soft, mild diffuse tenderness, nondistended, BS present  Ext: warm and well perfused, no pedal edema  Neuro: alert and oriented X3, cranial nerves II-XII grossly intact  Lab Results: Basic Metabolic Panel:  Recent Labs Lab 05/02/12 0615 05/03/12 0517  NA 140 138  K 3.7 3.4*  CL 108 105  CO2 21 23  GLUCOSE 106* 99  BUN 7 6  CREATININE 0.56 0.71  CALCIUM 9.0 9.2   Liver Function Tests:  Recent Labs Lab 05/02/12 0615 05/03/12 0517  AST 73* 40*  ALT 70* 57*  ALKPHOS 126* 128*  BILITOT  0.3 0.3  PROT 6.9 7.0  ALBUMIN 3.2* 3.4*   CBC:  Recent Labs Lab 05/01/12 0150  05/01/12 1800 05/02/12 0615  WBC 10.4  --  11.7* 7.9  NEUTROABS 5.5  --   --   --   HGB 11.4*  < > 11.0* 11.1*  HCT 34.8*  < > 33.7* 32.3*  MCV 89.7  --  88.2 86.6  PLT 469*  --  503* 433*  < > = values in this interval not displayed.  Medications: I have reviewed the patient's current medications. Scheduled Meds: . clindamycin  150 mg Oral TID AC & HS  . FLUoxetine  20 mg Oral Daily  . lamoTRIgine  200 mg Oral BID  . nicotine  21 mg Transdermal Daily   Continuous Infusions:  PRN Meds:.alum & mag hydroxide-simeth, dicyclomine, hydrOXYzine, [START ON 05/08/2012] ibuprofen, LORazepam, methocarbamol, ondansetron Assessment/Plan:  Elevated liver enzymes - Resolved and not following.   Depression - She is on fluoxetine and lamictal. Unclear compliance prior to hospital stay. Some flat affect but may be appropriate given her situation of homelessness and withdrawal of opiates.   Polysubstance dependence including opioid type drug - She is currently having mild opiate withdrawal symptoms including some anxiety, poor appetite. Her diarrhea is much  improved and stomach pain seems to be improving as well. She was able to tolerate food yesterday and her scores for withdrawal have declined significantly (from 7-9 4/11 to 2-3 4/12) Did call and speak with her mother and she was quite clear that the patient can not go home. Will call to have social worker verify the story of a bed opening. If there is one we can hold her until tomorrow. If not she will be D/Ced today to shelter as we have no medical reason to keep her here.   Hypokalemia - Awaiting recheck today. Repleted yesterday.  Tobacco abuse - Advised continued cessation and is on nicotine patch. Will not allow her to go smoke as this may be an excuse to go use opiates.   Disposition - Likely D/C soon, will call CSW today to see if we can confirm the  patient's story of a bed in [place]. If there is a bed she can stay until Monday when it is available. If there is no bed she can be D/Ced today back to shelter.   Dispo: Disposition is deferred at this time, awaiting improvement of current medical problems.  Anticipated discharge in approximately 0-1 day(s).   The patient does have a current PCP Gavin Potters, HEIDI, MD), therefore is not requiring OPC follow-up after discharge.   The patient does have transportation limitations that hinder transportation to clinic appointments.  .Services Needed at time of discharge: Y = Yes, Blank = No PT:   OT:   RN:   Equipment:   Other:     LOS: 4 days   Genella Mech 05/05/2012, 8:33 AM

## 2012-05-05 NOTE — Clinical Social Work Note (Signed)
CSW contacted [facility]. [facility] stating they can take patient once she is finished detoxing. Stating they do not have a bed today and cannot guarantee a bed tomorrow, Monday 4/14. [facility] stating patient can contact them tomorrow, Monday, 4/14 at 9a and if she has not used, may have a bed available for her.  Ricke Hey, [place] [phone] (weekend)

## 2012-05-05 NOTE — Progress Notes (Signed)
[facility] is working on getting pt there today.  Waiting on return call from [place], [place] on confirmation that pt can d/c there for treatment.  Spoke with pt's RN re: recent COW scores that will be reported to [facility], per their request.  Labs/nursing notes faxed as well.

## 2012-05-05 NOTE — Progress Notes (Signed)
Numerous attempts made for patient to be placed in rehab facility. All failed. Discharge instructions gone over with patient. Prescription given. Follow up appointment to be made. Diet and activity were discussed. Home medications were discussed. Patient was encouraged to refrain from narcotic use and to keep trying to find a rehab center. Discussed options for transportation with patient. Patient hoping to stay with her mother tonight. Social worker had provided patient with a bus pass also and spoke with her today in hopes of finding an alternative place for her to go, but none available or willing to accept patient with history. Patient verbalized understanding of instructions and was discharged.

## 2012-05-05 NOTE — Progress Notes (Signed)
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  MD informed.

## 2012-05-05 NOTE — ED Notes (Signed)
CSW working on [facility] referral for pt, however, they do not accept pt's with a pmh of seizure d/o.  Pt encouraged to f/u with [facility] a/ or other SA facilities in the area.

## 2012-06-17 ENCOUNTER — Emergency Department: Payer: Self-pay | Admitting: Emergency Medicine

## 2012-07-26 ENCOUNTER — Emergency Department: Payer: Self-pay | Admitting: Emergency Medicine

## 2012-07-26 LAB — COMPREHENSIVE METABOLIC PANEL
Albumin: 4 g/dL (ref 3.4–5.0)
Alkaline Phosphatase: 149 U/L — ABNORMAL HIGH (ref 50–136)
Anion Gap: 8 (ref 7–16)
BUN: 8 mg/dL (ref 7–18)
Bilirubin,Total: 0.3 mg/dL (ref 0.2–1.0)
Calcium, Total: 9.4 mg/dL (ref 8.5–10.1)
Chloride: 104 mmol/L (ref 98–107)
Co2: 26 mmol/L (ref 21–32)
Creatinine: 0.76 mg/dL (ref 0.60–1.30)
EGFR (Non-African Amer.): >60
Potassium: 3.7 mmol/L (ref 3.5–5.1)
SGOT(AST): 9 U/L — ABNORMAL LOW (ref 15–37)
Total Protein: 7.8 g/dL (ref 6.4–8.2)

## 2012-07-26 LAB — CBC
HCT: 38.8 % (ref 35.0–47.0)
HGB: 12.7 g/dL (ref 12.0–16.0)
MCH: 28.3 pg (ref 26.0–34.0)
MCHC: 32.7 g/dL (ref 32.0–36.0)
RBC: 4.48 10*6/uL (ref 3.80–5.20)
RDW: 18.7 % — ABNORMAL HIGH (ref 11.5–14.5)
WBC: 10.4 10*3/uL (ref 3.6–11.0)

## 2012-07-26 LAB — URINALYSIS, COMPLETE
Bilirubin,UR: NEGATIVE
Glucose,UR: NEGATIVE mg/dL (ref 0–75)
Ketone: NEGATIVE
Leukocyte Esterase: NEGATIVE
Ph: 6 (ref 4.5–8.0)
RBC,UR: 199 /HPF (ref 0–5)
Squamous Epithelial: 24
WBC UR: 13 /HPF (ref 0–5)

## 2012-08-05 ENCOUNTER — Emergency Department: Payer: Self-pay | Admitting: Emergency Medicine

## 2012-08-05 LAB — CBC
HCT: 36.5 % (ref 35.0–47.0)
MCH: 28.8 pg (ref 26.0–34.0)
MCV: 87 fL (ref 80–100)
RBC: 4.21 10*6/uL (ref 3.80–5.20)
RDW: 18.2 % — ABNORMAL HIGH (ref 11.5–14.5)

## 2012-08-05 LAB — COMPREHENSIVE METABOLIC PANEL
Alkaline Phosphatase: 130 U/L (ref 50–136)
Anion Gap: 10 (ref 7–16)
BUN: 8 mg/dL (ref 7–18)
Calcium, Total: 8.8 mg/dL (ref 8.5–10.1)
Chloride: 111 mmol/L — ABNORMAL HIGH (ref 98–107)
Co2: 22 mmol/L (ref 21–32)
Creatinine: 0.68 mg/dL (ref 0.60–1.30)
EGFR (Non-African Amer.): >60
SGPT (ALT): 16 U/L (ref 12–78)

## 2012-08-05 LAB — URINALYSIS, COMPLETE
Bilirubin,UR: NEGATIVE
Glucose,UR: NEGATIVE mg/dL (ref 0–75)
Ketone: NEGATIVE
Nitrite: NEGATIVE
Ph: 6 (ref 4.5–8.0)
Protein: 100
RBC,UR: 3620 /HPF (ref 0–5)
Specific Gravity: 1.023 (ref 1.003–1.030)
WBC UR: 32 /HPF (ref 0–5)

## 2012-08-05 LAB — DRUG SCREEN, URINE
Amphetamines, Ur Screen: NEGATIVE (ref ?–1000)
Barbiturates, Ur Screen: NEGATIVE (ref ?–200)
MDMA (Ecstasy)Ur Screen: POSITIVE (ref ?–500)
Phencyclidine (PCP) Ur S: NEGATIVE (ref ?–25)
Tricyclic, Ur Screen: NEGATIVE (ref ?–1000)

## 2012-08-05 LAB — LIPASE, BLOOD: Lipase: 337 U/L (ref 73–393)

## 2012-08-07 ENCOUNTER — Emergency Department: Payer: Self-pay | Admitting: Internal Medicine

## 2012-08-07 LAB — COMPREHENSIVE METABOLIC PANEL
Albumin: 3.8 g/dL (ref 3.4–5.0)
Alkaline Phosphatase: 125 U/L (ref 50–136)
Anion Gap: 8 (ref 7–16)
BUN: 11 mg/dL (ref 7–18)
Calcium, Total: 8.8 mg/dL (ref 8.5–10.1)
Chloride: 108 mmol/L — ABNORMAL HIGH (ref 98–107)
Co2: 24 mmol/L (ref 21–32)
EGFR (African American): >60
EGFR (Non-African Amer.): >60
Osmolality: 279 (ref 275–301)
SGPT (ALT): 18 U/L (ref 12–78)
Sodium: 140 mmol/L (ref 136–145)
Total Protein: 7.3 g/dL (ref 6.4–8.2)

## 2012-08-07 LAB — CBC
HCT: 36.5 % (ref 35.0–47.0)
HGB: 12.1 g/dL (ref 12.0–16.0)
MCV: 86 fL (ref 80–100)
Platelet: 322 10*3/uL (ref 150–440)
RBC: 4.24 10*6/uL (ref 3.80–5.20)
RDW: 18.4 % — ABNORMAL HIGH (ref 11.5–14.5)
WBC: 13 10*3/uL — ABNORMAL HIGH (ref 3.6–11.0)

## 2012-08-07 LAB — URINALYSIS, COMPLETE
Glucose,UR: NEGATIVE mg/dL (ref 0–75)
Ketone: NEGATIVE
Protein: 30
Specific Gravity: 1.018 (ref 1.003–1.030)
Squamous Epithelial: 2
WBC UR: 3 /HPF (ref 0–5)

## 2012-08-07 LAB — LIPASE, BLOOD: Lipase: 317 U/L (ref 73–393)

## 2012-08-07 LAB — URINE CULTURE

## 2012-08-09 ENCOUNTER — Inpatient Hospital Stay: Payer: Self-pay | Admitting: Obstetrics and Gynecology

## 2012-08-09 LAB — COMPREHENSIVE METABOLIC PANEL
Albumin: 3.9 g/dL (ref 3.4–5.0)
Alkaline Phosphatase: 129 U/L (ref 50–136)
Anion Gap: 6 — ABNORMAL LOW (ref 7–16)
Bilirubin,Total: 0.1 mg/dL — ABNORMAL LOW (ref 0.2–1.0)
Co2: 22 mmol/L (ref 21–32)
EGFR (Non-African Amer.): >60
Glucose: 98 mg/dL (ref 65–99)
Osmolality: 274 (ref 275–301)
Potassium: 4 mmol/L (ref 3.5–5.1)
SGPT (ALT): 18 U/L (ref 12–78)
Sodium: 137 mmol/L (ref 136–145)
Total Protein: 7.7 g/dL (ref 6.4–8.2)

## 2012-08-09 LAB — URINALYSIS, COMPLETE
Glucose,UR: NEGATIVE mg/dL (ref 0–75)
Hyaline Cast: 1
Ketone: NEGATIVE
Nitrite: NEGATIVE
Protein: NEGATIVE
Squamous Epithelial: 3
WBC UR: 9 /HPF (ref 0–5)

## 2012-08-09 LAB — CBC
HCT: 37.8 % (ref 35.0–47.0)
HGB: 12.5 g/dL (ref 12.0–16.0)
MCH: 28.5 pg (ref 26.0–34.0)
MCV: 86 fL (ref 80–100)
Platelet: 320 10*3/uL (ref 150–440)

## 2012-08-09 LAB — LIPASE, BLOOD: Lipase: 276 U/L (ref 73–393)

## 2012-08-09 LAB — WET PREP, GENITAL

## 2012-08-10 LAB — CBC WITH DIFFERENTIAL/PLATELET
Basophil %: 0.7 %
Eosinophil #: 0.3 10*3/uL (ref 0.0–0.7)
Eosinophil %: 3 %
HCT: 34.6 % — ABNORMAL LOW (ref 35.0–47.0)
Lymphocyte #: 2.7 10*3/uL (ref 1.0–3.6)
Lymphocyte %: 32.4 %
MCH: 29.1 pg (ref 26.0–34.0)
MCHC: 33.8 g/dL (ref 32.0–36.0)
MCV: 86 fL (ref 80–100)
Monocyte %: 13.4 %
Neutrophil #: 4.2 10*3/uL (ref 1.4–6.5)
RBC: 4.02 10*6/uL (ref 3.80–5.20)

## 2012-08-11 LAB — URINE CULTURE

## 2012-09-04 ENCOUNTER — Ambulatory Visit: Payer: Self-pay | Admitting: Obstetrics and Gynecology

## 2012-09-04 LAB — BASIC METABOLIC PANEL
BUN: 10 mg/dL (ref 7–18)
Calcium, Total: 8.8 mg/dL (ref 8.5–10.1)
Chloride: 109 mmol/L — ABNORMAL HIGH (ref 98–107)
Co2: 25 mmol/L (ref 21–32)
Creatinine: 0.57 mg/dL — ABNORMAL LOW (ref 0.60–1.30)
EGFR (Non-African Amer.): >60
Osmolality: 278 (ref 275–301)
Sodium: 140 mmol/L (ref 136–145)

## 2012-09-04 LAB — CBC
HCT: 37.1 % (ref 35.0–47.0)
HGB: 12.5 g/dL (ref 12.0–16.0)
MCH: 29.7 pg (ref 26.0–34.0)
MCV: 88 fL (ref 80–100)
Platelet: 374 10*3/uL (ref 150–440)
RDW: 17.3 % — ABNORMAL HIGH (ref 11.5–14.5)

## 2012-09-04 LAB — PREGNANCY, URINE: Pregnancy Test, Urine: NEGATIVE m[IU]/mL

## 2012-09-07 ENCOUNTER — Emergency Department: Payer: Self-pay | Admitting: Emergency Medicine

## 2012-09-07 LAB — COMPREHENSIVE METABOLIC PANEL
Albumin: 3.6 g/dL (ref 3.4–5.0)
Anion Gap: 5 — ABNORMAL LOW (ref 7–16)
BUN: 9 mg/dL (ref 7–18)
Bilirubin,Total: 0.2 mg/dL (ref 0.2–1.0)
Chloride: 108 mmol/L — ABNORMAL HIGH (ref 98–107)
Co2: 26 mmol/L (ref 21–32)
Creatinine: 0.65 mg/dL (ref 0.60–1.30)
EGFR (African American): >60
EGFR (Non-African Amer.): >60
Glucose: 93 mg/dL (ref 65–99)
Osmolality: 276 (ref 275–301)
SGOT(AST): 16 U/L (ref 15–37)
SGPT (ALT): 27 U/L (ref 12–78)
Sodium: 139 mmol/L (ref 136–145)

## 2012-09-07 LAB — CBC WITH DIFFERENTIAL/PLATELET
Basophil #: 0.1 10*3/uL (ref 0.0–0.1)
Eosinophil #: 0.4 10*3/uL (ref 0.0–0.7)
HCT: 33 % — ABNORMAL LOW (ref 35.0–47.0)
HGB: 11.4 g/dL — ABNORMAL LOW (ref 12.0–16.0)
Lymphocyte %: 34.8 %
MCHC: 34.7 g/dL (ref 32.0–36.0)
Monocyte %: 9.7 %
Neutrophil #: 5.1 10*3/uL (ref 1.4–6.5)
Neutrophil %: 50.2 %
Platelet: 318 10*3/uL (ref 150–440)
RDW: 17.6 % — ABNORMAL HIGH (ref 11.5–14.5)

## 2012-09-07 LAB — URINALYSIS, COMPLETE
Ketone: NEGATIVE
Leukocyte Esterase: NEGATIVE
Nitrite: NEGATIVE
Protein: 30
Squamous Epithelial: 1

## 2012-09-10 ENCOUNTER — Inpatient Hospital Stay: Payer: Self-pay | Admitting: Obstetrics and Gynecology

## 2012-09-11 LAB — HEMATOCRIT: HCT: 30.4 % — ABNORMAL LOW (ref 35.0–47.0)

## 2012-09-18 ENCOUNTER — Emergency Department: Payer: Self-pay | Admitting: Emergency Medicine

## 2012-09-18 LAB — COMPREHENSIVE METABOLIC PANEL
Alkaline Phosphatase: 131 U/L (ref 50–136)
Anion Gap: 8 (ref 7–16)
Bilirubin,Total: 0.2 mg/dL (ref 0.2–1.0)
Chloride: 105 mmol/L (ref 98–107)
Co2: 23 mmol/L (ref 21–32)
EGFR (African American): >60
EGFR (Non-African Amer.): >60
Glucose: 86 mg/dL (ref 65–99)
Osmolality: 270 (ref 275–301)
SGOT(AST): 16 U/L (ref 15–37)
SGPT (ALT): 21 U/L (ref 12–78)
Sodium: 136 mmol/L (ref 136–145)
Total Protein: 7.4 g/dL (ref 6.4–8.2)

## 2012-09-18 LAB — CBC
HCT: 35.9 % (ref 35.0–47.0)
HGB: 12.2 g/dL (ref 12.0–16.0)
MCHC: 33.8 g/dL (ref 32.0–36.0)
Platelet: 422 10*3/uL (ref 150–440)
RDW: 16.3 % — ABNORMAL HIGH (ref 11.5–14.5)
WBC: 10.5 10*3/uL (ref 3.6–11.0)

## 2012-09-18 LAB — URINALYSIS, COMPLETE
Bilirubin,UR: NEGATIVE
Ketone: NEGATIVE
RBC,UR: 205 /HPF (ref 0–5)
Specific Gravity: 1.034 (ref 1.003–1.030)
WBC UR: 4 /HPF (ref 0–5)

## 2012-09-26 ENCOUNTER — Emergency Department: Payer: Self-pay | Admitting: Emergency Medicine

## 2012-09-26 LAB — URINALYSIS, COMPLETE
Ketone: NEGATIVE
Nitrite: NEGATIVE
Ph: 6 (ref 4.5–8.0)
Protein: 30
RBC,UR: 40 /HPF (ref 0–5)
Squamous Epithelial: 8
WBC UR: 3 /HPF (ref 0–5)

## 2012-09-26 LAB — CBC
HCT: 34.8 % — ABNORMAL LOW (ref 35.0–47.0)
MCHC: 34.6 g/dL (ref 32.0–36.0)
Platelet: 461 10*3/uL — ABNORMAL HIGH (ref 150–440)

## 2012-09-26 LAB — COMPREHENSIVE METABOLIC PANEL
Alkaline Phosphatase: 116 U/L (ref 50–136)
Anion Gap: 4 — ABNORMAL LOW (ref 7–16)
BUN: 7 mg/dL (ref 7–18)
Bilirubin,Total: 0.1 mg/dL — ABNORMAL LOW (ref 0.2–1.0)
Calcium, Total: 8.5 mg/dL (ref 8.5–10.1)
Chloride: 108 mmol/L — ABNORMAL HIGH (ref 98–107)
EGFR (African American): >60
EGFR (Non-African Amer.): >60
Glucose: 94 mg/dL (ref 65–99)
Osmolality: 272 (ref 275–301)
SGOT(AST): 11 U/L — ABNORMAL LOW (ref 15–37)
Total Protein: 7.1 g/dL (ref 6.4–8.2)

## 2012-09-27 LAB — COMPREHENSIVE METABOLIC PANEL
Alkaline Phosphatase: 108 U/L (ref 50–136)
Anion Gap: 4 — ABNORMAL LOW (ref 7–16)
BUN: 5 mg/dL — ABNORMAL LOW (ref 7–18)
Bilirubin,Total: 0.2 mg/dL (ref 0.2–1.0)
Calcium, Total: 8.3 mg/dL — ABNORMAL LOW (ref 8.5–10.1)
Chloride: 110 mmol/L — ABNORMAL HIGH (ref 98–107)
Creatinine: 0.63 mg/dL (ref 0.60–1.30)
EGFR (African American): >60
Glucose: 91 mg/dL (ref 65–99)
Sodium: 139 mmol/L (ref 136–145)
Total Protein: 6.6 g/dL (ref 6.4–8.2)

## 2012-09-27 LAB — CBC WITH DIFFERENTIAL/PLATELET
Basophil %: 0.9 %
Eosinophil %: 4.8 %
HCT: 37.4 % (ref 35.0–47.0)
HGB: 12.8 g/dL (ref 12.0–16.0)
Lymphocyte #: 2.1 10*3/uL (ref 1.0–3.6)
Lymphocyte %: 24.6 %
MCH: 30.2 pg (ref 26.0–34.0)
MCHC: 34.4 g/dL (ref 32.0–36.0)
MCV: 88 fL (ref 80–100)
Monocyte #: 0.9 x10 3/mm (ref 0.2–0.9)
Neutrophil #: 4.9 10*3/uL (ref 1.4–6.5)
Neutrophil %: 58.8 %
RDW: 15.7 % — ABNORMAL HIGH (ref 11.5–14.5)

## 2012-09-27 LAB — URINALYSIS, COMPLETE
Bacteria: NONE SEEN
Bilirubin,UR: NEGATIVE
Leukocyte Esterase: NEGATIVE
Protein: NEGATIVE
Specific Gravity: 1.057 (ref 1.003–1.030)
WBC UR: 5 /HPF (ref 0–5)

## 2012-09-27 LAB — GC/CHLAMYDIA PROBE AMP

## 2012-09-28 LAB — COMPREHENSIVE METABOLIC PANEL
BUN: 4 mg/dL — ABNORMAL LOW (ref 7–18)
Bilirubin,Total: 0.3 mg/dL (ref 0.2–1.0)
Chloride: 109 mmol/L — ABNORMAL HIGH (ref 98–107)
Co2: 26 mmol/L (ref 21–32)
Creatinine: 0.72 mg/dL (ref 0.60–1.30)
EGFR (African American): >60
EGFR (Non-African Amer.): >60
Glucose: 108 mg/dL — ABNORMAL HIGH (ref 65–99)
Sodium: 138 mmol/L (ref 136–145)
Total Protein: 6.2 g/dL — ABNORMAL LOW (ref 6.4–8.2)

## 2012-09-28 LAB — CBC WITH DIFFERENTIAL/PLATELET
Basophil %: 1.2 %
Eosinophil #: 0.4 10*3/uL (ref 0.0–0.7)
Eosinophil %: 5.5 %
HGB: 11.6 g/dL — ABNORMAL LOW (ref 12.0–16.0)
Lymphocyte #: 2.3 10*3/uL (ref 1.0–3.6)
Lymphocyte %: 30.9 %
MCH: 31.1 pg (ref 26.0–34.0)
Neutrophil #: 3.8 10*3/uL (ref 1.4–6.5)
RBC: 3.74 10*6/uL — ABNORMAL LOW (ref 3.80–5.20)

## 2012-09-29 LAB — URINE CULTURE

## 2012-09-30 ENCOUNTER — Inpatient Hospital Stay: Payer: Self-pay | Admitting: Obstetrics and Gynecology

## 2012-10-02 LAB — CULTURE, BLOOD (SINGLE)

## 2012-10-26 LAB — CBC
HCT: 39 % (ref 35.0–47.0)
MCHC: 33.1 g/dL (ref 32.0–36.0)
RBC: 4.23 10*6/uL (ref 3.80–5.20)

## 2012-10-26 LAB — URINALYSIS, COMPLETE
Bilirubin,UR: NEGATIVE
Glucose,UR: NEGATIVE mg/dL (ref 0–75)
Ketone: NEGATIVE
Nitrite: NEGATIVE
Ph: 5 (ref 4.5–8.0)
Protein: 100
RBC,UR: 1338 /HPF (ref 0–5)
Specific Gravity: 1.047 (ref 1.003–1.030)

## 2012-10-26 LAB — COMPREHENSIVE METABOLIC PANEL
Albumin: 4.4 g/dL (ref 3.4–5.0)
Alkaline Phosphatase: 176 U/L — ABNORMAL HIGH (ref 50–136)
Anion Gap: 10 (ref 7–16)
BUN: 7 mg/dL (ref 7–18)
Chloride: 106 mmol/L (ref 98–107)
Co2: 21 mmol/L (ref 21–32)
Osmolality: 272 (ref 275–301)
SGOT(AST): 30 U/L (ref 15–37)
Sodium: 137 mmol/L (ref 136–145)

## 2012-10-26 LAB — ACETAMINOPHEN LEVEL: Acetaminophen: 37 ug/mL — ABNORMAL HIGH

## 2012-10-26 LAB — DRUG SCREEN, URINE
Cannabinoid 50 Ng, Ur ~~LOC~~: NEGATIVE (ref ?–50)
Cocaine Metabolite,Ur ~~LOC~~: NEGATIVE (ref ?–300)
MDMA (Ecstasy)Ur Screen: POSITIVE (ref ?–500)
Opiate, Ur Screen: POSITIVE (ref ?–300)
Phencyclidine (PCP) Ur S: NEGATIVE (ref ?–25)
Tricyclic, Ur Screen: NEGATIVE (ref ?–1000)

## 2012-10-26 LAB — TSH: Thyroid Stimulating Horm: 2.04 u[IU]/mL

## 2012-10-26 LAB — SALICYLATE LEVEL: Salicylates, Serum: 2.6 mg/dL

## 2012-10-28 ENCOUNTER — Inpatient Hospital Stay: Payer: Self-pay | Admitting: Psychiatry

## 2012-11-01 ENCOUNTER — Emergency Department: Payer: Self-pay | Admitting: Emergency Medicine

## 2012-11-02 LAB — URINALYSIS, COMPLETE
Bacteria: NONE SEEN
Bilirubin,UR: NEGATIVE
Glucose,UR: NEGATIVE mg/dL (ref 0–75)
Ketone: NEGATIVE
Nitrite: NEGATIVE
Ph: 6 (ref 4.5–8.0)
Protein: NEGATIVE
Specific Gravity: 1.018 (ref 1.003–1.030)
WBC UR: 1 /HPF (ref 0–5)

## 2012-11-02 LAB — CBC
HCT: 34.3 % — ABNORMAL LOW (ref 35.0–47.0)
HGB: 11.9 g/dL — ABNORMAL LOW (ref 12.0–16.0)
MCH: 31.2 pg (ref 26.0–34.0)
MCHC: 34.7 g/dL (ref 32.0–36.0)
MCV: 90 fL (ref 80–100)
Platelet: 303 10*3/uL (ref 150–440)
RBC: 3.81 10*6/uL (ref 3.80–5.20)
WBC: 7.4 10*3/uL (ref 3.6–11.0)

## 2012-11-02 LAB — COMPREHENSIVE METABOLIC PANEL
Alkaline Phosphatase: 142 U/L — ABNORMAL HIGH (ref 50–136)
Anion Gap: 8 (ref 7–16)
BUN: 5 mg/dL — ABNORMAL LOW (ref 7–18)
Bilirubin,Total: 0.2 mg/dL (ref 0.2–1.0)
Calcium, Total: 8.7 mg/dL (ref 8.5–10.1)
Creatinine: 0.85 mg/dL (ref 0.60–1.30)
EGFR (African American): >60
Potassium: 3.4 mmol/L — ABNORMAL LOW (ref 3.5–5.1)

## 2012-11-02 LAB — DRUG SCREEN, URINE
Barbiturates, Ur Screen: POSITIVE (ref ?–200)
Cannabinoid 50 Ng, Ur ~~LOC~~: NEGATIVE (ref ?–50)
Opiate, Ur Screen: POSITIVE (ref ?–300)
Phencyclidine (PCP) Ur S: NEGATIVE (ref ?–25)
Tricyclic, Ur Screen: NEGATIVE (ref ?–1000)

## 2012-11-02 LAB — ETHANOL: Ethanol: <3 mg/dL

## 2012-11-03 ENCOUNTER — Emergency Department: Payer: Self-pay | Admitting: Emergency Medicine

## 2012-11-04 LAB — URINALYSIS, COMPLETE
Bacteria: NONE SEEN
Glucose,UR: NEGATIVE mg/dL (ref 0–75)
Ketone: NEGATIVE
Nitrite: NEGATIVE
Protein: NEGATIVE
Specific Gravity: 1.03 (ref 1.003–1.030)
Squamous Epithelial: 3

## 2012-11-04 LAB — COMPREHENSIVE METABOLIC PANEL
Albumin: 3.9 g/dL (ref 3.4–5.0)
BUN: 12 mg/dL (ref 7–18)
Chloride: 109 mmol/L — ABNORMAL HIGH (ref 98–107)
Co2: 19 mmol/L — ABNORMAL LOW (ref 21–32)
Creatinine: 0.66 mg/dL (ref 0.60–1.30)
EGFR (African American): >60
EGFR (Non-African Amer.): >60
Osmolality: 275 (ref 275–301)
SGPT (ALT): 19 U/L (ref 12–78)
Total Protein: 7.3 g/dL (ref 6.4–8.2)

## 2012-11-04 LAB — CBC
Platelet: 298 10*3/uL (ref 150–440)
RBC: 3.97 10*6/uL (ref 3.80–5.20)
WBC: 8.7 10*3/uL (ref 3.6–11.0)

## 2012-11-04 LAB — PREGNANCY, URINE: Pregnancy Test, Urine: NEGATIVE m[IU]/mL

## 2012-11-04 LAB — LIPASE, BLOOD: Lipase: 302 U/L (ref 73–393)

## 2012-11-10 ENCOUNTER — Emergency Department: Payer: Self-pay | Admitting: Emergency Medicine

## 2012-11-10 LAB — CBC
HCT: 36.1 % (ref 35.0–47.0)
HGB: 12.4 g/dL (ref 12.0–16.0)
MCH: 31.1 pg (ref 26.0–34.0)
MCHC: 34.4 g/dL (ref 32.0–36.0)
MCV: 90 fL (ref 80–100)
Platelet: 349 10*3/uL (ref 150–440)
RDW: 16.1 % — ABNORMAL HIGH (ref 11.5–14.5)

## 2012-11-10 LAB — BASIC METABOLIC PANEL
Calcium, Total: 8.5 mg/dL (ref 8.5–10.1)
Chloride: 110 mmol/L — ABNORMAL HIGH (ref 98–107)
Creatinine: 0.7 mg/dL (ref 0.60–1.30)
EGFR (African American): >60
EGFR (Non-African Amer.): >60
Osmolality: 274 (ref 275–301)
Sodium: 138 mmol/L (ref 136–145)

## 2012-11-10 LAB — URINALYSIS, COMPLETE
Bacteria: NONE SEEN
Bilirubin,UR: NEGATIVE
Glucose,UR: NEGATIVE mg/dL (ref 0–75)
Leukocyte Esterase: NEGATIVE
Protein: 100
RBC,UR: 2861 /HPF (ref 0–5)
WBC UR: 2 /HPF (ref 0–5)

## 2013-05-17 ENCOUNTER — Emergency Department (HOSPITAL_COMMUNITY)
Admission: EM | Admit: 2013-05-17 | Discharge: 2013-05-17 | Disposition: A | Discharge status: Home / Self Care | Payer: Medicare Other | Attending: Emergency Medicine | Admitting: Emergency Medicine

## 2013-05-17 ENCOUNTER — Emergency Department (HOSPITAL_COMMUNITY): Payer: Medicare Other

## 2013-05-17 ENCOUNTER — Encounter (HOSPITAL_COMMUNITY): Payer: Self-pay | Admitting: Emergency Medicine

## 2013-05-17 DIAGNOSIS — R110 Nausea: Secondary | ICD-10-CM | POA: Insufficient documentation

## 2013-05-17 DIAGNOSIS — R6883 Chills (without fever): Secondary | ICD-10-CM | POA: Insufficient documentation

## 2013-05-17 DIAGNOSIS — Z3202 Encounter for pregnancy test, result negative: Secondary | ICD-10-CM | POA: Insufficient documentation

## 2013-05-17 DIAGNOSIS — Z8739 Personal history of other diseases of the musculoskeletal system and connective tissue: Secondary | ICD-10-CM | POA: Insufficient documentation

## 2013-05-17 DIAGNOSIS — F329 Major depressive disorder, single episode, unspecified: Secondary | ICD-10-CM | POA: Insufficient documentation

## 2013-05-17 DIAGNOSIS — R109 Unspecified abdominal pain: Secondary | ICD-10-CM | POA: Insufficient documentation

## 2013-05-17 DIAGNOSIS — K921 Melena: Secondary | ICD-10-CM | POA: Insufficient documentation

## 2013-05-17 DIAGNOSIS — Z79899 Other long term (current) drug therapy: Secondary | ICD-10-CM | POA: Insufficient documentation

## 2013-05-17 DIAGNOSIS — G40909 Epilepsy, unspecified, not intractable, without status epilepticus: Secondary | ICD-10-CM | POA: Insufficient documentation

## 2013-05-17 DIAGNOSIS — F17200 Nicotine dependence, unspecified, uncomplicated: Secondary | ICD-10-CM | POA: Insufficient documentation

## 2013-05-17 DIAGNOSIS — Z87442 Personal history of urinary calculi: Secondary | ICD-10-CM | POA: Insufficient documentation

## 2013-05-17 DIAGNOSIS — F3289 Other specified depressive episodes: Secondary | ICD-10-CM | POA: Insufficient documentation

## 2013-05-17 DIAGNOSIS — Z91040 Latex allergy status: Secondary | ICD-10-CM | POA: Insufficient documentation

## 2013-05-17 LAB — COMPREHENSIVE METABOLIC PANEL
ALT: 7 U/L (ref 0–35)
AST: 11 U/L (ref 0–37)
Albumin: 4 g/dL (ref 3.5–5.2)
Alkaline Phosphatase: 88 U/L (ref 39–117)
BILIRUBIN TOTAL: 0.3 mg/dL (ref 0.3–1.2)
BUN: 11 mg/dL (ref 6–23)
CALCIUM: 9.3 mg/dL (ref 8.4–10.5)
CHLORIDE: 105 meq/L (ref 96–112)
CO2: 19 meq/L (ref 19–32)
CREATININE: 0.72 mg/dL (ref 0.50–1.10)
Glucose, Bld: 99 mg/dL (ref 70–99)
Potassium: 4.1 mEq/L (ref 3.7–5.3)
Sodium: 138 mEq/L (ref 137–147)
Total Protein: 7.2 g/dL (ref 6.0–8.3)

## 2013-05-17 LAB — CBC WITH DIFFERENTIAL/PLATELET
Basophils Absolute: 0 10*3/uL (ref 0.0–0.1)
Basophils Relative: 0 % (ref 0–1)
EOS PCT: 1 % (ref 0–5)
Eosinophils Absolute: 0.1 10*3/uL (ref 0.0–0.7)
HEMATOCRIT: 41.3 % (ref 36.0–46.0)
HEMOGLOBIN: 14.5 g/dL (ref 12.0–15.0)
LYMPHS PCT: 25 % (ref 12–46)
Lymphs Abs: 2.1 10*3/uL (ref 0.7–4.0)
MCH: 32.7 pg (ref 26.0–34.0)
MCHC: 35.1 g/dL (ref 30.0–36.0)
MCV: 93 fL (ref 78.0–100.0)
MONO ABS: 0.9 10*3/uL (ref 0.1–1.0)
MONOS PCT: 11 % (ref 3–12)
NEUTROS ABS: 5.3 10*3/uL (ref 1.7–7.7)
Neutrophils Relative %: 63 % (ref 43–77)
Platelets: 280 10*3/uL (ref 150–400)
RBC: 4.44 MIL/uL (ref 3.87–5.11)
RDW: 12.4 % (ref 11.5–15.5)
WBC: 8.4 10*3/uL (ref 4.0–10.5)

## 2013-05-17 LAB — URINALYSIS, ROUTINE W REFLEX MICROSCOPIC
GLUCOSE, UA: NEGATIVE mg/dL
Hgb urine dipstick: NEGATIVE
KETONES UR: NEGATIVE mg/dL
NITRITE: NEGATIVE
PROTEIN: NEGATIVE mg/dL
Specific Gravity, Urine: 1.032 — ABNORMAL HIGH (ref 1.005–1.030)
UROBILINOGEN UA: 1 mg/dL (ref 0.0–1.0)
pH: 6.5 (ref 5.0–8.0)

## 2013-05-17 LAB — LIPASE, BLOOD: LIPASE: 27 U/L (ref 11–59)

## 2013-05-17 LAB — URINE MICROSCOPIC-ADD ON

## 2013-05-17 LAB — PREGNANCY, URINE: Preg Test, Ur: NEGATIVE

## 2013-05-17 LAB — POC OCCULT BLOOD, ED: Fecal Occult Bld: POSITIVE — AB

## 2013-05-17 MED ORDER — HYDROMORPHONE HCL PF 1 MG/ML IJ SOLN
1.0000 mg | Freq: Once | INTRAMUSCULAR | Status: AC
  Administered 2013-05-17: 1 mg via INTRAVENOUS
  Filled 2013-05-17: qty 1

## 2013-05-17 MED ORDER — IOHEXOL 300 MG/ML  SOLN
100.0000 mL | Freq: Once | INTRAMUSCULAR | Status: AC | PRN
  Administered 2013-05-17: 100 mL via INTRAVENOUS

## 2013-05-17 MED ORDER — ACETAMINOPHEN 325 MG PO TABS
650.0000 mg | ORAL_TABLET | Freq: Once | ORAL | Status: AC
  Administered 2013-05-17: 650 mg via ORAL
  Filled 2013-05-17: qty 2

## 2013-05-17 MED ORDER — SODIUM CHLORIDE 0.9 % IV BOLUS (SEPSIS)
1000.0000 mL | Freq: Once | INTRAVENOUS | Status: AC
  Administered 2013-05-17: 1000 mL via INTRAVENOUS

## 2013-05-17 MED ORDER — IOHEXOL 300 MG/ML  SOLN: 20.0000 mL | INTRAMUSCULAR | Status: AC

## 2013-05-17 MED ORDER — ONDANSETRON 4 MG PO TBDP
4.0000 mg | ORAL_TABLET | Freq: Once | ORAL | Status: AC
  Administered 2013-05-17: 4 mg via ORAL
  Filled 2013-05-17: qty 1

## 2013-05-17 NOTE — ED Provider Notes (Signed)
CSN: [id number]     Arrival date & time 05/17/13  1318 History   First MD Initiated Contact with Patient 05/17/13 1333     Chief Complaint  Patient presents with  . Rectal Bleeding     (Consider location/radiation/quality/duration/timing/severity/associated sxs/prior Treatment) The history is provided by the patient. No language interpreter was used.  Sally Walker is a 28 y/o F with PMHx of SBO, diverticulitis, Depression, seizure disorder, drug dependence, fibromyalgia presenting to the ED with abdominal pain that started on week ago. Patient reported that the discomfort is localized to the right side of the abdomen described as a sharp, stabbing pain that is constant with mild radiation towards her back. Patient reported that she has been having bright red bloody stools starting 2 days ago with each bowel movement. Stated that she has discomfort when she wipes - noted bright red blood on the toilet paper when she wipes. Patient reported that she has been having chills and nausea. Reported that she has had at least 11 abdominal surgeries in her lifetime. Denied fever, vomiting, numbness, tingling, chest pain, shortness of breath, difficulty breathing, urinary symptoms, vaginal bleeding, vaginal pain, vaginal discharge.  PCP Dr. Gavin Potters  Reported that she is due to see a GI physician on 05/28/2013 - patient is unable to recall the name of the physician.   Past Medical History  Diagnosis Date  . SBO (small bowel obstruction) 2011    "scar tissue rupture on my intestines"   . Diverticulitis   . Depression     Per pt, no history of suicidal ideation  . Seizure disorder     Last seizure in 2012  . Lumbar herniated disc     per patient  . 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 Drug dependence   . Polysubstance abuse   . Rheumatoid arthritis(714.0)   . Osteoporosis   . Kidney stones   . Fibromyalgia    Past Surgical History  Procedure Laterality Date  .  Total colectomy      Done in [place]  . Cholecystectomy      per patient  . Other surgical history      kidney stone removal    Family History  Problem Relation Age of Onset  . Leukemia Father   . Heart disease      paternal GM, GF  . Fibromyalgia Mother   . Heart disease      maternal GM   History  Substance Use Topics  . Smoking status: Current Every Day Smoker -- 1.00 packs/day for 14 years    Types: Cigarettes  . Smokeless tobacco: Not on file  . Alcohol Use: No   OB History   Grav Para Term Preterm Abortions TAB SAB Ect Mult Living                 Review of Systems  Constitutional: Positive for chills. Negative for fever.  Eyes: Negative for visual disturbance.  Respiratory: Negative for chest tightness and shortness of breath.   Cardiovascular: Negative for chest pain.  Gastrointestinal: Positive for nausea, abdominal pain and blood in stool. Negative for vomiting, diarrhea, constipation and anal bleeding.  Genitourinary: Negative for dysuria, decreased urine volume, vaginal bleeding, vaginal discharge and vaginal pain.  Neurological: Negative for dizziness, weakness and headaches.  All other systems reviewed and are negative.     Allergies  Adhesive; Fentanyl; Midodrine; Morphine and related; Toradol; Vancomycin; and Latex  Home Medications   Prior  to Admission medications   Medication Sig Start Date End Date Taking? Authorizing Provider  clindamycin (CLEOCIN) 150 MG capsule Take 1 capsule (150 mg total) by mouth every 6 (six) hours. 05/01/12   Arman Filter, NP  FLUoxetine (PROZAC) 20 MG tablet Take 20 mg by mouth daily.    Historical Provider, MD  hydrOXYzine (ATARAX/VISTARIL) 25 MG tablet Take 1 tablet (25 mg total) by mouth every 6 (six) hours as needed for anxiety. 05/05/12   Judie Bonus, MD  LamoTRIgine 200 MG TB24 Take 400 mg by mouth at bedtime.    Historical Provider, MD  pregabalin (LYRICA) 100 MG capsule Take 100 mg by mouth daily.    Historical  Provider, MD   BP 108/63  Pulse 85  Temp(Src) 98.1 F (36.7 C) (Oral)  Resp 16  Wt 142 lb 4.8 oz (64.547 kg)  SpO2 96%  LMP 02/17/2012 Physical Exam  Nursing note and vitals reviewed. Constitutional: She is oriented to person, place, and time. She appears well-developed and well-nourished. No distress.  HENT:  Head: Normocephalic and atraumatic.  Mouth/Throat: Oropharynx is clear and moist. No oropharyngeal exudate.  Eyes: Conjunctivae and EOM are normal. Pupils are equal, round, and reactive to light. Right eye exhibits no discharge. Left eye exhibits no discharge.  Neck: Normal range of motion. Neck supple. No tracheal deviation present.  Cardiovascular: Normal rate, regular rhythm and normal heart sounds.  Exam reveals no friction rub.   No murmur heard. Pulses:      Radial pulses are 2+ on the right side, and 2+ on the left side.       Dorsalis pedis pulses are 2+ on the right side, and 2+ on the left side.  Pulmonary/Chest: Effort normal and breath sounds normal. No respiratory distress. She has no wheezes. She has no rales.  Abdominal: Soft. Bowel sounds are normal. She exhibits no distension. There is tenderness. There is guarding. There is no rebound.    Healed over scars noted to the abdomen - 3 large scars noted BS normoactive in all 4 quadrants Discomfort upon palpation to the right side of the abdomen  Positive guarding noted to palpation  Genitourinary: Guaiac positive stool.  Rectal Exam: Negative swelling, erythema, inflammation, lesions, sores noted to the anus. Negative active bleeding noted. Good sphincter tone. Negative palpation of polyps palpated. Negative fissures noted. Negative blood on glove. Brown stools on glove.   Musculoskeletal: Normal range of motion.  Lymphadenopathy:    She has no cervical adenopathy.  Neurological: She is alert and oriented to person, place, and time. No cranial nerve deficit. She exhibits normal muscle tone. Coordination normal.    Skin: Skin is warm and dry. No rash noted. She is not diaphoretic. No erythema.  Psychiatric: She has a normal mood and affect. Her behavior is normal. Thought content normal.    ED Course  Procedures (including critical care time)  IV team called to get IV access - patient will most likely need a CT abdomen and pelvis to be performed.   Labs Review Labs Reviewed  URINALYSIS, ROUTINE W REFLEX MICROSCOPIC - Abnormal; Notable for the following:    APPearance CLOUDY (*)    Specific Gravity, Urine 1.032 (*)    Bilirubin Urine SMALL (*)    Leukocytes, UA SMALL (*)    All other components within normal limits  URINE MICROSCOPIC-ADD ON - Abnormal; Notable for the following:    Squamous Epithelial / LPF FEW (*)    All other components within  normal limits  POC OCCULT BLOOD, ED - Abnormal; Notable for the following:    Fecal Occult Bld POSITIVE (*)    All other components within normal limits  CBC WITH DIFFERENTIAL  COMPREHENSIVE METABOLIC PANEL  LIPASE, BLOOD  PREGNANCY, URINE    Imaging Review No results found.   EKG Interpretation None      MDM   Final diagnoses:  None   Medications  sodium chloride 0.9 % bolus 1,000 mL (not administered)  ondansetron (ZOFRAN-ODT) disintegrating tablet 4 mg (4 mg Oral Given 05/17/13 1547)  acetaminophen (TYLENOL) tablet 650 mg (650 mg Oral Given 05/17/13 1546)   Filed Vitals:   05/17/13 1322 05/17/13 1400 05/17/13 1545  BP: 108/63 103/67 103/69  Pulse: 85 64 60  Temp: 98.1 F (36.7 C)    TempSrc: Oral    Resp: 16    Weight: 142 lb 4.8 oz (64.547 kg)    SpO2: 96% 98% 98%    Patient presenting to the ED with abdominal pain that started approximately ago with associated nausea and chills. Patient reported that she's had at least 11 abdominal surgeries in her lifetime. Stated that she's been using Tylenol with minimal relief. This provider reviewed the patient's chart - last CT scan of the abdomen and pelvis performed in January 2015  with status post colectomy identified. Abundant stools at the rectosigmoid: Identified. No evidence of small bowel structure identified. Cholecystectomy identified. CBC negative elevated white blood cell count identified-negative left shift or leukocytosis noted. Negative drop in hemoglobin and hematocrit-Global 14.5, hematocrit 41.3. CMP negative findings-kidney and liver functioning well. Lipase negative elevation. Urine pregnancy negative. Urinalysis noted to the sites within the urine with white blood cell count of 7-10. Elevated specific gravity of 1.232-dehydration identified. Fecal occult positive. 4:09 PM Discussed case with Resident, Dr. Leotis Shames - discussed with resident plain film of abdomen order to rule out obstruction. Discussed with resident that IV team has been called and that they are to get an IV on the patient to start fluids. Reported that patient will most likely need a repeat CT abdomen and pelvis to be performed. Transfer of care to Dr. Leotis Shames at change in shift.   Raymon Mutton, PA-C 05/17/13 1754  Sydnie Sigmund, PA-C 05/17/13 1841

## 2013-05-17 NOTE — ED Notes (Signed)
Attempted IV. Unable to gain access.

## 2013-05-17 NOTE — ED Notes (Addendum)
Per Patient, Patient complains of abdominal pain and bright red bleeding coming from her rectum. Patient states she has a stabbing sensation in her right lower quadrant 9/10.

## 2013-05-17 NOTE — ED Notes (Signed)
Patient walked to the bathroom and placed back on the monitor. Patient made comfortable. Patient made aware the Resident needs to read the CT Scan.

## 2013-05-17 NOTE — ED Notes (Addendum)
Sally Duck, RN unable to obtain IV access. Patient complaining of pain. Patient given medication PO for pain because of no IV access. IV team paged.

## 2013-05-17 NOTE — ED Notes (Signed)
Resident at the bedside

## 2013-05-17 NOTE — ED Notes (Signed)
IV team at the bedside. 

## 2013-05-17 NOTE — ED Notes (Signed)
Patient returned from CT and placed back on the monitor. Patient on the phone. Made comfortable.

## 2013-05-17 NOTE — Discharge Instructions (Signed)

## 2013-05-17 NOTE — ED Notes (Signed)
Spoke with IV team on the phone. Coming down to attempt IV in patient.

## 2013-05-17 NOTE — ED Notes (Signed)
Patient complaining of pain. Resident [facility] notified. Resident stated he wanted to wait till CT results. Patient verbalizes understanding.

## 2013-05-17 NOTE — ED Provider Notes (Signed)
Patient care assumed from [facility], [place].  Please see her note for full details. Briefly 27 rolled female with history of fibromyalgia, drug dependence, multiple small bowel obstructions, multiple abdominal surgeries who presented for 1 week of abdominal pain to the right lower abdomen. She's had nausea but no episodes of vomiting she has been passing bowel movements she reported rectal bleeding but was found to have brown stool Hemoccult positive on exam. Patient tolerating by mouth in the emergency department without difficulty  Laboratory evaluation unremarkable. Do to patient's continued abdominal pain and history of multiple surgeries CT scan of the abdomen and pelvis was obtained. Results report "mildly prominent small bowel in the lower abdominal midline at prior anastomosis sites. The appearance may be postoperative in nature, although partial obstruction cannot be completely excluded. Proximal small bowel is not dilated, and contrast does not pass distally."  Patient was discussed with general surgery reviewed her CT scans. They agreed that CT findings and current exam did not warrant admission. Recommended followup with her surgeons at [facility].  Treatment plan discussed with the patient who is in agreement to return precautions were given for any worsening of symptoms and patient states that she will follow with her surgeon at [facility].  Clinical impression: 1. Abdominal pain      Cherre Robins, MD 05/17/13 [phone]

## 2013-05-18 NOTE — ED Provider Notes (Signed)
Medical screening examination/treatment/procedure(s) were performed by non-physician practitioner and as supervising physician I was immediately available for consultation/collaboration.    Zamari Bonsall L Kiwan Gadsden, MD 05/18/13 0706 

## 2013-05-19 LAB — URINE CULTURE

## 2013-05-20 ENCOUNTER — Emergency Department: Payer: Self-pay | Admitting: Emergency Medicine

## 2013-05-20 LAB — CBC WITH DIFFERENTIAL/PLATELET
BASOS ABS: 0 10*3/uL (ref 0.0–0.1)
Basophil %: 0.4 %
EOS PCT: 0.5 %
Eosinophil #: 0 10*3/uL (ref 0.0–0.7)
HCT: 42.4 % (ref 35.0–47.0)
HGB: 14.4 g/dL (ref 12.0–16.0)
Lymphocyte #: 1.9 10*3/uL (ref 1.0–3.6)
Lymphocyte %: 18 %
MCH: 31.9 pg (ref 26.0–34.0)
MCHC: 33.9 g/dL (ref 32.0–36.0)
MCV: 94 fL (ref 80–100)
MONOS PCT: 9.7 %
Monocyte #: 1 x10 3/mm — ABNORMAL HIGH (ref 0.2–0.9)
NEUTROS ABS: 7.4 10*3/uL — AB (ref 1.4–6.5)
NEUTROS PCT: 71.4 %
Platelet: 277 10*3/uL (ref 150–440)
RBC: 4.51 10*6/uL (ref 3.80–5.20)
RDW: 12.8 % (ref 11.5–14.5)
WBC: 10.3 10*3/uL (ref 3.6–11.0)

## 2013-05-20 LAB — COMPREHENSIVE METABOLIC PANEL
AST: 9 U/L — AB (ref 15–37)
Albumin: 4.3 g/dL (ref 3.4–5.0)
Alkaline Phosphatase: 92 U/L
Anion Gap: 10 (ref 7–16)
BUN: 12 mg/dL (ref 7–18)
Bilirubin,Total: 0.3 mg/dL (ref 0.2–1.0)
Calcium, Total: 9.3 mg/dL (ref 8.5–10.1)
Chloride: 107 mmol/L (ref 98–107)
Co2: 22 mmol/L (ref 21–32)
Creatinine: 0.8 mg/dL (ref 0.60–1.30)
EGFR (Non-African Amer.): >60
Glucose: 96 mg/dL (ref 65–99)
Osmolality: 277 (ref 275–301)
POTASSIUM: 3.6 mmol/L (ref 3.5–5.1)
SGPT (ALT): 13 U/L (ref 12–78)
SODIUM: 139 mmol/L (ref 136–145)
Total Protein: 7.9 g/dL (ref 6.4–8.2)

## 2013-05-20 LAB — URINALYSIS, COMPLETE
BLOOD: NEGATIVE
Glucose,UR: NEGATIVE mg/dL (ref 0–75)
Nitrite: NEGATIVE
PH: 6 (ref 4.5–8.0)
Protein: 30
Specific Gravity: 1.035 (ref 1.003–1.030)
Squamous Epithelial: 2
WBC UR: 2 /HPF (ref 0–5)

## 2013-05-20 LAB — LIPASE, BLOOD: Lipase: 212 U/L (ref 73–393)

## 2013-05-25 ENCOUNTER — Emergency Department (HOSPITAL_COMMUNITY)
Admission: EM | Admit: 2013-05-25 | Discharge: 2013-05-25 | Disposition: A | Discharge status: Home / Self Care | Payer: Medicare Other | Attending: Emergency Medicine | Admitting: Emergency Medicine

## 2013-05-25 ENCOUNTER — Encounter (HOSPITAL_COMMUNITY): Payer: Self-pay | Admitting: Emergency Medicine

## 2013-05-25 DIAGNOSIS — R109 Unspecified abdominal pain: Secondary | ICD-10-CM

## 2013-05-25 DIAGNOSIS — IMO0001 Reserved for inherently not codable concepts without codable children: Secondary | ICD-10-CM | POA: Insufficient documentation

## 2013-05-25 DIAGNOSIS — Z9089 Acquired absence of other organs: Secondary | ICD-10-CM | POA: Insufficient documentation

## 2013-05-25 DIAGNOSIS — F17200 Nicotine dependence, unspecified, uncomplicated: Secondary | ICD-10-CM | POA: Insufficient documentation

## 2013-05-25 DIAGNOSIS — R112 Nausea with vomiting, unspecified: Secondary | ICD-10-CM | POA: Insufficient documentation

## 2013-05-25 DIAGNOSIS — R1033 Periumbilical pain: Secondary | ICD-10-CM | POA: Insufficient documentation

## 2013-05-25 DIAGNOSIS — G40909 Epilepsy, unspecified, not intractable, without status epilepticus: Secondary | ICD-10-CM | POA: Insufficient documentation

## 2013-05-25 DIAGNOSIS — R197 Diarrhea, unspecified: Secondary | ICD-10-CM | POA: Insufficient documentation

## 2013-05-25 DIAGNOSIS — Z8719 Personal history of other diseases of the digestive system: Secondary | ICD-10-CM | POA: Insufficient documentation

## 2013-05-25 DIAGNOSIS — Z91040 Latex allergy status: Secondary | ICD-10-CM | POA: Insufficient documentation

## 2013-05-25 DIAGNOSIS — R1084 Generalized abdominal pain: Secondary | ICD-10-CM | POA: Insufficient documentation

## 2013-05-25 DIAGNOSIS — Z8739 Personal history of other diseases of the musculoskeletal system and connective tissue: Secondary | ICD-10-CM | POA: Insufficient documentation

## 2013-05-25 DIAGNOSIS — Z79899 Other long term (current) drug therapy: Secondary | ICD-10-CM | POA: Insufficient documentation

## 2013-05-25 DIAGNOSIS — Z87442 Personal history of urinary calculi: Secondary | ICD-10-CM | POA: Insufficient documentation

## 2013-05-25 DIAGNOSIS — F3289 Other specified depressive episodes: Secondary | ICD-10-CM | POA: Insufficient documentation

## 2013-05-25 DIAGNOSIS — F329 Major depressive disorder, single episode, unspecified: Secondary | ICD-10-CM | POA: Insufficient documentation

## 2013-05-25 DIAGNOSIS — Z3202 Encounter for pregnancy test, result negative: Secondary | ICD-10-CM | POA: Insufficient documentation

## 2013-05-25 DIAGNOSIS — G8929 Other chronic pain: Secondary | ICD-10-CM

## 2013-05-25 LAB — CBC WITH DIFFERENTIAL/PLATELET
Basophils Absolute: 0 10*3/uL (ref 0.0–0.1)
Basophils Relative: 0 % (ref 0–1)
EOS ABS: 0.2 10*3/uL (ref 0.0–0.7)
Eosinophils Relative: 2 % (ref 0–5)
HCT: 36.1 % (ref 36.0–46.0)
HEMOGLOBIN: 12.5 g/dL (ref 12.0–15.0)
Lymphocytes Relative: 29 % (ref 12–46)
Lymphs Abs: 2.6 10*3/uL (ref 0.7–4.0)
MCH: 32.4 pg (ref 26.0–34.0)
MCHC: 34.6 g/dL (ref 30.0–36.0)
MCV: 93.5 fL (ref 78.0–100.0)
MONO ABS: 1 10*3/uL (ref 0.1–1.0)
MONOS PCT: 11 % (ref 3–12)
Neutro Abs: 5.3 10*3/uL (ref 1.7–7.7)
Neutrophils Relative %: 58 % (ref 43–77)
Platelets: 258 10*3/uL (ref 150–400)
RBC: 3.86 MIL/uL — ABNORMAL LOW (ref 3.87–5.11)
RDW: 13.1 % (ref 11.5–15.5)
WBC: 9.1 10*3/uL (ref 4.0–10.5)

## 2013-05-25 LAB — COMPREHENSIVE METABOLIC PANEL
ALK PHOS: 80 U/L (ref 39–117)
ALT: 7 U/L (ref 0–35)
AST: 11 U/L (ref 0–37)
Albumin: 4.3 g/dL (ref 3.5–5.2)
BUN: 8 mg/dL (ref 6–23)
CO2: 21 mEq/L (ref 19–32)
Calcium: 9 mg/dL (ref 8.4–10.5)
Chloride: 107 mEq/L (ref 96–112)
Creatinine, Ser: 0.8 mg/dL (ref 0.50–1.10)
GFR calc non Af Amer: >90 mL/min (ref >90)
GLUCOSE: 102 mg/dL — AB (ref 70–99)
POTASSIUM: 4 meq/L (ref 3.7–5.3)
Sodium: 143 mEq/L (ref 137–147)
TOTAL PROTEIN: 7.1 g/dL (ref 6.0–8.3)

## 2013-05-25 LAB — URINALYSIS, ROUTINE W REFLEX MICROSCOPIC
Glucose, UA: NEGATIVE mg/dL
Hgb urine dipstick: NEGATIVE
KETONES UR: NEGATIVE mg/dL
Leukocytes, UA: NEGATIVE
NITRITE: NEGATIVE
Protein, ur: 30 mg/dL — AB
SPECIFIC GRAVITY, URINE: 1.038 — AB (ref 1.005–1.030)
UROBILINOGEN UA: 1 mg/dL (ref 0.0–1.0)
pH: 6 (ref 5.0–8.0)

## 2013-05-25 LAB — URINE MICROSCOPIC-ADD ON

## 2013-05-25 LAB — PREGNANCY, URINE: Preg Test, Ur: NEGATIVE

## 2013-05-25 LAB — LIPASE, BLOOD: LIPASE: 67 U/L — AB (ref 11–59)

## 2013-05-25 MED ORDER — DIPHENHYDRAMINE HCL 50 MG/ML IJ SOLN
25.0000 mg | Freq: Once | INTRAMUSCULAR | Status: AC
  Administered 2013-05-25: 25 mg via INTRAVENOUS

## 2013-05-25 MED ORDER — DIPHENHYDRAMINE HCL 50 MG/ML IJ SOLN
INTRAMUSCULAR | Status: AC
  Administered 2013-05-25: 25 mg via INTRAVENOUS
  Filled 2013-05-25: qty 1

## 2013-05-25 MED ORDER — SODIUM CHLORIDE 0.9 % IV BOLUS (SEPSIS)
1000.0000 mL | Freq: Once | INTRAVENOUS | Status: AC
  Administered 2013-05-25: 1000 mL via INTRAVENOUS

## 2013-05-25 MED ORDER — HYDROMORPHONE HCL PF 1 MG/ML IJ SOLN
1.0000 mg | Freq: Once | INTRAMUSCULAR | Status: AC
  Administered 2013-05-25: 1 mg via INTRAVENOUS
  Filled 2013-05-25: qty 1

## 2013-05-25 MED ORDER — ONDANSETRON HCL 4 MG/2ML IJ SOLN
4.0000 mg | Freq: Once | INTRAMUSCULAR | Status: AC
  Administered 2013-05-25: 4 mg via INTRAVENOUS
  Filled 2013-05-25: qty 2

## 2013-05-25 NOTE — Discharge Instructions (Signed)
Please follow up closely with your doctor at [facility] for further management of your abdominal pain.  Abdominal Pain, Women Abdominal (stomach, pelvic, or belly) pain can be caused by many things. It is important to tell your doctor:  The location of the pain.  Does it come and go or is it present all the time?  Are there things that start the pain (eating certain foods, exercise)?  Are there other symptoms associated with the pain (fever, nausea, vomiting, diarrhea)? All of this is helpful to know when trying to find the cause of the pain. CAUSES   Stomach: virus or bacteria infection, or ulcer.  Intestine: appendicitis (inflamed appendix), regional ileitis (Crohn's disease), ulcerative colitis (inflamed colon), irritable bowel syndrome, diverticulitis (inflamed diverticulum of the colon), or cancer of the stomach or intestine.  Gallbladder disease or stones in the gallbladder.  Kidney disease, kidney stones, or infection.  Pancreas infection or cancer.  Fibromyalgia (pain disorder).  Diseases of the female organs:  Uterus: fibroid (non-cancerous) tumors or infection.  Fallopian tubes: infection or tubal pregnancy.  Ovary: cysts or tumors.  Pelvic adhesions (scar tissue).  Endometriosis (uterus lining tissue growing in the pelvis and on the pelvic organs).  Pelvic congestion syndrome (female organs filling up with blood just before the menstrual period).  Pain with the menstrual period.  Pain with ovulation (producing an egg).  Pain with an IUD (intrauterine device, birth control) in the uterus.  Cancer of the female organs.  Functional pain (pain not caused by a disease, may improve without treatment).  Psychological pain.  Depression. DIAGNOSIS  Your doctor will decide the seriousness of your pain by doing an examination.  Blood tests.  X-rays.  Ultrasound.  CT scan (computed tomography, special type of X-ray).  MRI (magnetic resonance  imaging).  Cultures, for infection.  Barium enema (dye inserted in the large intestine, to better view it with X-rays).  Colonoscopy (looking in intestine with a lighted tube).  Laparoscopy (minor surgery, looking in abdomen with a lighted tube).  Major abdominal exploratory surgery (looking in abdomen with a large incision). TREATMENT  The treatment will depend on the cause of the pain.   Many cases can be observed and treated at home.  Over-the-counter medicines recommended by your caregiver.  Prescription medicine.  Antibiotics, for infection.  Birth control pills, for painful periods or for ovulation pain.  Hormone treatment, for endometriosis.  Nerve blocking injections.  Physical therapy.  Antidepressants.  Counseling with a psychologist or psychiatrist.  Minor or major surgery. HOME CARE INSTRUCTIONS   Do not take laxatives, unless directed by your caregiver.  Take over-the-counter pain medicine only if ordered by your caregiver. Do not take aspirin because it can cause an upset stomach or bleeding.  Try a clear liquid diet (broth or water) as ordered by your caregiver. Slowly move to a bland diet, as tolerated, if the pain is related to the stomach or intestine.  Have a thermometer and take your temperature several times a day, and record it.  Bed rest and sleep, if it helps the pain.  Avoid sexual intercourse, if it causes pain.  Avoid stressful situations.  Keep your follow-up appointments and tests, as your caregiver orders.  If the pain does not go away with medicine or surgery, you may try:  Acupuncture.  Relaxation exercises (yoga, meditation).  Group therapy.  Counseling. SEEK MEDICAL CARE IF:   You notice certain foods cause stomach pain.  Your home care treatment is not helping your pain.  You need stronger pain medicine.  You want your IUD removed.  You feel faint or lightheaded.  You develop nausea and vomiting.  You  develop a rash.  You are having side effects or an allergy to your medicine. SEEK IMMEDIATE MEDICAL CARE IF:   Your pain does not go away or gets worse.  You have a fever.  Your pain is felt only in portions of the abdomen. The right side could possibly be appendicitis. The left lower portion of the abdomen could be colitis or diverticulitis.  You are passing blood in your stools (bright red or black tarry stools, with or without vomiting).  You have blood in your urine.  You develop chills, with or without a fever.  You pass out. MAKE SURE YOU:   Understand these instructions.  Will watch your condition.  Will get help right away if you are not doing well or get worse. Document Released: 11/06/2006 Document Revised: 04/03/2011 Document Reviewed: 11/26/2008 [facility] Patient Information 2014 [place], [place].

## 2013-05-25 NOTE — ED Provider Notes (Signed)
History/physical exam/procedure(s) were performed by non-physician practitioner and as supervising physician I was immediately available for consultation/collaboration. I have reviewed all notes and am in agreement with care and plan.   Hilario Quarry, MD 05/25/13 2016

## 2013-05-25 NOTE — ED Provider Notes (Signed)
CSN: [id number]     Arrival date & time 05/25/13  1653 History   First MD Initiated Contact with Patient 05/25/13 1742     Chief Complaint  Patient presents with  . Abdominal Pain     (Consider location/radiation/quality/duration/timing/severity/associated sxs/prior Treatment) HPI This is a 28 year old female with history of drug dependence, fibromyalgia, multiple small bowel obstructions, multiple abdominal surgeries, history of diverticulitis, history of rheumatoid arthritis who presents complaining of abdominal pain. Patient reports having mid abdominal tenderness ongoing for 2-3 weeks. She described as a burning sharp sensation, persistent, worsening with eating. She endorsed having nausea vomiting and diarrhea. She endorsed to 4 bouts of nonbloody non-ileus vomitus per day and more than 5 bouts of slightly bloody stool without mucus per day. Patient felt the symptoms are similar to prior pancreatitis. States she would have 3-4 bouts of pancreatitis per year for the past 5 years. Does not know the cause of her pancreatitis and states she does not drink alcohol and no history of diabetes. There has been no recent medication changes or recent travel. Report not been sexually active for the past several years and states that her last menstrual period was 02/17/2012. She also reports being evaluated at an ER in [place] for this complaint yesterday. States she was diagnosed with having pancreatitis, given pain medication, and subsequently discharge. Now her pain is getting progressively worse and she is here for further pain management. She denies having fever, chills, chest pain, shortness of breath, productive cough, dysuria, hematuria. Does report history of kidney stones but states the pain does not feel like a kidney stone attack.     Past Medical History  Diagnosis Date  . SBO (small bowel obstruction) 2011    "scar tissue rupture on my intestines"   . Diverticulitis   . Depression     Per  pt, no history of suicidal ideation  . Seizure disorder     Last seizure in 2012  . Lumbar herniated disc     per patient  . 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 Drug dependence   . Polysubstance abuse   . Rheumatoid arthritis(714.0)   . Osteoporosis   . Kidney stones   . Fibromyalgia    Past Surgical History  Procedure Laterality Date  . Total colectomy      Done in [place]  . Cholecystectomy      per patient  . Other surgical history      kidney stone removal    Family History  Problem Relation Age of Onset  . Leukemia Father   . Heart disease      paternal GM, GF  . Fibromyalgia Mother   . Heart disease      maternal GM   History  Substance Use Topics  . Smoking status: Current Every Day Smoker -- 1.00 packs/day for 14 years    Types: Cigarettes  . Smokeless tobacco: Not on file  . Alcohol Use: No   OB History   Grav Para Term Preterm Abortions TAB SAB Ect Mult Living                 Review of Systems  All other systems reviewed and are negative.     Allergies  Adhesive; Fentanyl; Midodrine; Morphine and related; Toradol; Vancomycin; and Latex  Home Medications   Prior to Admission medications   Medication Sig Start Date End Date Taking? Authorizing Provider  FLUoxetine (PROZAC) 20 MG tablet Take 20  mg by mouth daily.    Historical Provider, MD  gabapentin (NEURONTIN) 300 MG capsule Take 300-600 mg by mouth 3 (three) times daily. Take 2 capsules at bedtime    Historical Provider, MD  hydrOXYzine (ATARAX/VISTARIL) 25 MG tablet Take 1 tablet (25 mg total) by mouth every 6 (six) hours as needed for anxiety. 05/05/12   Judie Bonus, MD  LamoTRIgine 200 MG TB24 Take 400 mg by mouth at bedtime.    Historical Provider, MD   BP 111/67  Pulse 76  Temp(Src) 98.3 F (36.8 C)  Resp 18  Ht 5\' 6"  (1.676 m)  Wt 143 lb (64.864 kg)  BMI 23.09 kg/m2  SpO2 99%  LMP 02/17/2012 Physical Exam  Constitutional: She is oriented to person,  place, and time. She appears well-developed and well-nourished. No distress.  HENT:  Head: Normocephalic and atraumatic.  Eyes: Conjunctivae are normal.  Neck: Normal range of motion. Neck supple.  Cardiovascular: Normal rate and regular rhythm.  Exam reveals no gallop and no friction rub.   No murmur heard. Pulmonary/Chest: Effort normal. No respiratory distress. She has no wheezes.  Abdominal: Soft. Bowel sounds are normal. She exhibits no distension. There is tenderness (Mild diffuse abdominal tenderness most significant to the periumbilical region.). There is no rebound and no guarding.  Multiple abdominal surgical scars noted throughout abdomen, appears noninfected. No obvious hernia noted  Musculoskeletal: Normal range of motion.  Neurological: She is alert and oriented to person, place, and time.    ED Course  Procedures (including critical care time)  Patient presents complaining of abdominal pain whichs he thought that is related to the pancreatitis that she had multiple times in the past. She has been seen evaluated for this complaint multiple times at different facility within the past several days. I looked through Care Everywhere documentation, and noticed that patient was seen at [facility] systems 2 days ago. Her lipase was 90 at that time. Today her labs are reassuring, lipase 67, UA and with calcium oxalate crystal but no hematuria and no evidence of UTI. Low suspicion for kidney stone. Patient is well-appearing with a nonsurgical abdomen. The plan is to continue managing her pain but she would need to follow up with her PCP for further management of her chronic condition. I do not think appropriate to prescribe narcotic pain medication for her chronic pain.  Pt recommended to f/u with her specialist at [facility] for further care.  Pt agrees.  Labs Review Labs Reviewed  COMPREHENSIVE METABOLIC PANEL - Abnormal; Notable for the following:    Glucose, Bld 102 (*)    Total  Bilirubin <0.2 (*)    All other components within normal limits  LIPASE, BLOOD - Abnormal; Notable for the following:    Lipase 67 (*)    All other components within normal limits  URINALYSIS, ROUTINE W REFLEX MICROSCOPIC - Abnormal; Notable for the following:    APPearance HAZY (*)    Specific Gravity, Urine 1.038 (*)    Bilirubin Urine SMALL (*)    Protein, ur 30 (*)    All other components within normal limits  URINE MICROSCOPIC-ADD ON - Abnormal; Notable for the following:    Squamous Epithelial / LPF MANY (*)    Bacteria, UA FEW (*)    Crystals CA OXALATE CRYSTALS (*)    All other components within normal limits  CBC WITH DIFFERENTIAL - Abnormal; Notable for the following:    RBC 3.86 (*)    All other components within  normal limits  CBC WITH DIFFERENTIAL  PREGNANCY, URINE  POC OCCULT BLOOD, ED    Imaging Review No results found.   EKG Interpretation None      MDM   Final diagnoses:  Chronic abdominal pain    BP 121/71  Pulse 75  Temp(Src) 98.3 F (36.8 C)  Resp 18  Ht 5\' 6"  (1.676 m)  Wt 143 lb (64.864 kg)  BMI 23.09 kg/m2  SpO2 99%  LMP 02/17/2012   I have reviewed nursing notes and vital signs.  I reviewed available ER/hospitalization records thought the EMR     02/19/2012, Fayrene Helper 05/25/13 1956

## 2013-05-25 NOTE — ED Notes (Signed)
The  Pt was diagnosed with pancreatitis. Yesterday at an ed in [place].  She has had abd pain for 2-3 weeks she reports that she has stool in her urine and urine in her stool.  nv Patient's last menstrual period was 02/17/2012. none

## 2013-05-25 NOTE — ED Notes (Signed)
Confirmed with lab that second lavender tube was received to rerun CBC due to first tube being hemolyzed.

## 2013-05-26 ENCOUNTER — Emergency Department: Payer: Self-pay | Admitting: Emergency Medicine

## 2013-05-26 LAB — COMPREHENSIVE METABOLIC PANEL
ANION GAP: 7 (ref 7–16)
AST: 7 U/L — AB (ref 15–37)
Albumin: 3.9 g/dL (ref 3.4–5.0)
Alkaline Phosphatase: 86 U/L
BUN: 8 mg/dL (ref 7–18)
Bilirubin,Total: 0.2 mg/dL (ref 0.2–1.0)
CREATININE: 0.67 mg/dL (ref 0.60–1.30)
Calcium, Total: 9.1 mg/dL (ref 8.5–10.1)
Chloride: 113 mmol/L — ABNORMAL HIGH (ref 98–107)
Co2: 24 mmol/L (ref 21–32)
EGFR (African American): >60
EGFR (Non-African Amer.): >60
Glucose: 112 mg/dL — ABNORMAL HIGH (ref 65–99)
Osmolality: 286 (ref 275–301)
Potassium: 3.5 mmol/L (ref 3.5–5.1)
SGPT (ALT): 12 U/L (ref 12–78)
Sodium: 144 mmol/L (ref 136–145)
Total Protein: 7.3 g/dL (ref 6.4–8.2)

## 2013-05-26 LAB — CBC WITH DIFFERENTIAL/PLATELET
BASOS ABS: 0.1 10*3/uL (ref 0.0–0.1)
Basophil %: 1 %
EOS ABS: 0.1 10*3/uL (ref 0.0–0.7)
Eosinophil %: 1.3 %
HCT: 40.6 % (ref 35.0–47.0)
HGB: 13.5 g/dL (ref 12.0–16.0)
LYMPHS ABS: 2.2 10*3/uL (ref 1.0–3.6)
Lymphocyte %: 19.6 %
MCH: 31.4 pg (ref 26.0–34.0)
MCHC: 33.2 g/dL (ref 32.0–36.0)
MCV: 95 fL (ref 80–100)
MONOS PCT: 9.3 %
Monocyte #: 1 x10 3/mm — ABNORMAL HIGH (ref 0.2–0.9)
Neutrophil #: 7.6 10*3/uL — ABNORMAL HIGH (ref 1.4–6.5)
Neutrophil %: 68.8 %
Platelet: 266 10*3/uL (ref 150–440)
RBC: 4.29 10*6/uL (ref 3.80–5.20)
RDW: 13 % (ref 11.5–14.5)
WBC: 11.1 10*3/uL — ABNORMAL HIGH (ref 3.6–11.0)

## 2013-05-26 LAB — URINALYSIS, COMPLETE
BILIRUBIN, UR: NEGATIVE
BLOOD: NEGATIVE
GLUCOSE, UR: NEGATIVE mg/dL (ref 0–75)
KETONE: NEGATIVE
Leukocyte Esterase: NEGATIVE
Nitrite: NEGATIVE
PH: 7 (ref 4.5–8.0)
Protein: NEGATIVE
SPECIFIC GRAVITY: 1.018 (ref 1.003–1.030)

## 2013-05-26 LAB — LIPASE, BLOOD: Lipase: 436 U/L — ABNORMAL HIGH (ref 73–393)

## 2013-05-31 ENCOUNTER — Emergency Department (HOSPITAL_COMMUNITY)
Admission: EM | Admit: 2013-05-31 | Discharge: 2013-06-01 | Disposition: A | Discharge status: Home / Self Care | Payer: Medicare Other | Attending: Emergency Medicine | Admitting: Emergency Medicine

## 2013-05-31 ENCOUNTER — Encounter (HOSPITAL_COMMUNITY): Payer: Self-pay | Admitting: Emergency Medicine

## 2013-05-31 DIAGNOSIS — R112 Nausea with vomiting, unspecified: Secondary | ICD-10-CM | POA: Insufficient documentation

## 2013-05-31 DIAGNOSIS — Z9889 Other specified postprocedural states: Secondary | ICD-10-CM | POA: Insufficient documentation

## 2013-05-31 DIAGNOSIS — Z3202 Encounter for pregnancy test, result negative: Secondary | ICD-10-CM | POA: Insufficient documentation

## 2013-05-31 DIAGNOSIS — R1084 Generalized abdominal pain: Secondary | ICD-10-CM | POA: Insufficient documentation

## 2013-05-31 DIAGNOSIS — Z8739 Personal history of other diseases of the musculoskeletal system and connective tissue: Secondary | ICD-10-CM | POA: Insufficient documentation

## 2013-05-31 DIAGNOSIS — F17200 Nicotine dependence, unspecified, uncomplicated: Secondary | ICD-10-CM | POA: Insufficient documentation

## 2013-05-31 DIAGNOSIS — R109 Unspecified abdominal pain: Secondary | ICD-10-CM

## 2013-05-31 DIAGNOSIS — Z87442 Personal history of urinary calculi: Secondary | ICD-10-CM | POA: Insufficient documentation

## 2013-05-31 DIAGNOSIS — G40909 Epilepsy, unspecified, not intractable, without status epilepticus: Secondary | ICD-10-CM | POA: Insufficient documentation

## 2013-05-31 DIAGNOSIS — K921 Melena: Secondary | ICD-10-CM | POA: Insufficient documentation

## 2013-05-31 DIAGNOSIS — F3289 Other specified depressive episodes: Secondary | ICD-10-CM | POA: Insufficient documentation

## 2013-05-31 DIAGNOSIS — Z79899 Other long term (current) drug therapy: Secondary | ICD-10-CM | POA: Insufficient documentation

## 2013-05-31 DIAGNOSIS — F329 Major depressive disorder, single episode, unspecified: Secondary | ICD-10-CM | POA: Insufficient documentation

## 2013-05-31 DIAGNOSIS — K625 Hemorrhage of anus and rectum: Secondary | ICD-10-CM | POA: Insufficient documentation

## 2013-05-31 DIAGNOSIS — G8929 Other chronic pain: Secondary | ICD-10-CM | POA: Insufficient documentation

## 2013-05-31 DIAGNOSIS — R197 Diarrhea, unspecified: Secondary | ICD-10-CM | POA: Insufficient documentation

## 2013-05-31 DIAGNOSIS — Z9089 Acquired absence of other organs: Secondary | ICD-10-CM | POA: Insufficient documentation

## 2013-05-31 DIAGNOSIS — Z91040 Latex allergy status: Secondary | ICD-10-CM | POA: Insufficient documentation

## 2013-05-31 LAB — BASIC METABOLIC PANEL
BUN: 8 mg/dL (ref 6–23)
CO2: 18 mEq/L — ABNORMAL LOW (ref 19–32)
Calcium: 9.6 mg/dL (ref 8.4–10.5)
Chloride: 106 mEq/L (ref 96–112)
Creatinine, Ser: 0.84 mg/dL (ref 0.50–1.10)
GFR calc non Af Amer: >90 mL/min (ref >90)
Glucose, Bld: 97 mg/dL (ref 70–99)
POTASSIUM: 3.8 meq/L (ref 3.7–5.3)
SODIUM: 141 meq/L (ref 137–147)

## 2013-05-31 LAB — CBC WITH DIFFERENTIAL/PLATELET
Basophils Absolute: 0 10*3/uL (ref 0.0–0.1)
Basophils Relative: 0 % (ref 0–1)
EOS ABS: 0.1 10*3/uL (ref 0.0–0.7)
Eosinophils Relative: 1 % (ref 0–5)
HCT: 40.7 % (ref 36.0–46.0)
Hemoglobin: 14.1 g/dL (ref 12.0–15.0)
Lymphocytes Relative: 26 % (ref 12–46)
Lymphs Abs: 2.7 10*3/uL (ref 0.7–4.0)
MCH: 32.6 pg (ref 26.0–34.0)
MCHC: 34.6 g/dL (ref 30.0–36.0)
MCV: 94.2 fL (ref 78.0–100.0)
Monocytes Absolute: 1.2 10*3/uL — ABNORMAL HIGH (ref 0.1–1.0)
Monocytes Relative: 11 % (ref 3–12)
NEUTROS PCT: 62 % (ref 43–77)
Neutro Abs: 6.4 10*3/uL (ref 1.7–7.7)
PLATELETS: 306 10*3/uL (ref 150–400)
RBC: 4.32 MIL/uL (ref 3.87–5.11)
RDW: 13.2 % (ref 11.5–15.5)
WBC: 10.5 10*3/uL (ref 4.0–10.5)

## 2013-05-31 LAB — HEPATIC FUNCTION PANEL
ALK PHOS: 83 U/L (ref 39–117)
ALT: 6 U/L (ref 0–35)
AST: 10 U/L (ref 0–37)
Albumin: 4.7 g/dL (ref 3.5–5.2)
TOTAL PROTEIN: 7.7 g/dL (ref 6.0–8.3)
Total Bilirubin: 0.3 mg/dL (ref 0.3–1.2)

## 2013-05-31 LAB — POC OCCULT BLOOD, ED: Fecal Occult Bld: NEGATIVE

## 2013-05-31 LAB — AMYLASE: Amylase: 42 U/L (ref 0–105)

## 2013-05-31 LAB — LIPASE, BLOOD: Lipase: 26 U/L (ref 11–59)

## 2013-05-31 MED ORDER — SODIUM CHLORIDE 0.9 % IV BOLUS (SEPSIS)
1000.0000 mL | Freq: Once | INTRAVENOUS | Status: AC
  Administered 2013-05-31: 1000 mL via INTRAVENOUS

## 2013-05-31 MED ORDER — PROMETHAZINE HCL 25 MG/ML IJ SOLN
25.0000 mg | Freq: Once | INTRAMUSCULAR | Status: AC
  Administered 2013-05-31: 25 mg via INTRAVENOUS
  Filled 2013-05-31: qty 1

## 2013-05-31 MED ORDER — HYDROMORPHONE HCL PF 1 MG/ML IJ SOLN
1.0000 mg | Freq: Once | INTRAMUSCULAR | Status: AC
  Administered 2013-05-31: 1 mg via INTRAVENOUS
  Filled 2013-05-31: qty 1

## 2013-05-31 NOTE — ED Notes (Signed)
Patient with reported abd pain nausea, vomitting, and bright red rectal bleeding.  Patient has had sx for 1-2 weeks.  Patient has not seen her MD  Vs per ems 122/84, 90, 16 rr, cbg 101.  Patient states she did see her Md today and was told to come to ED.

## 2013-05-31 NOTE — ED Provider Notes (Signed)
CSN: [id number]     Arrival date & time 05/31/13  1811 History   First MD Initiated Contact with Patient 05/31/13 2057     Chief Complaint  Patient presents with  . Abdominal Pain     (Consider location/radiation/quality/duration/timing/severity/associated sxs/prior Treatment) HPI Comments: Patient is a 28 year old female with history of SBO, diverticulitis, depression, seizure disorder, drug dependence, polysubstance abuse, rheumatoid arthritis, fibromyalgia who presents today with abdominal pain. She states for the past 2 weeks she has been having gradually worsening abdominal pain. It is a stabbing pain generally in her abdomen. The pain radiates to her back. She has been seen at [facility] and diagnosed with pancreatitis. She was given compazine by her PCP and told to follow a clear liquid diet. She states she has been compliant with this, however the compazine is not working. She has associated vomiting and diarrhea. She reports blood in her stools and emesis. She feels as though her stools are black and tarry and she is having bright red blood in her emesis. She has had 11 prior abdominal surgeries in [place]. She moved to [place] in 2013. She has just started seeing a gastroenterologist at [facility], but missed her appointment on 5/5.   Patient is a 28 y.o. female presenting with abdominal pain. The history is provided by the patient. No language interpreter was used.  Abdominal Pain Associated symptoms: nausea and vomiting   Associated symptoms: no chest pain, no chills, no fever and no shortness of breath     Past Medical History  Diagnosis Date  . SBO (small bowel obstruction) 2011    "scar tissue rupture on my intestines"   . Diverticulitis   . Depression     Per pt, no history of suicidal ideation  . Seizure disorder     Last seizure in 2012  . Lumbar herniated disc     per patient  . 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 Drug dependence   . Polysubstance  abuse   . Rheumatoid arthritis(714.0)   . Osteoporosis   . Kidney stones   . Fibromyalgia    Past Surgical History  Procedure Laterality Date  . Total colectomy      Done in [place]  . Cholecystectomy      per patient  . Other surgical history      kidney stone removal    Family History  Problem Relation Age of Onset  . Leukemia Father   . Heart disease      paternal GM, GF  . Fibromyalgia Mother   . Heart disease      maternal GM   History  Substance Use Topics  . Smoking status: Current Every Day Smoker -- 1.00 packs/day for 14 years    Types: Cigarettes  . Smokeless tobacco: Not on file  . Alcohol Use: No   OB History   Grav Para Term Preterm Abortions TAB SAB Ect Mult Living                 Review of Systems  Constitutional: Negative for fever and chills.  Respiratory: Negative for shortness of breath.   Cardiovascular: Negative for chest pain.  Gastrointestinal: Positive for nausea, vomiting, abdominal pain, blood in stool and anal bleeding.  All other systems reviewed and are negative.     Allergies  Adhesive; Fentanyl; Midodrine; Morphine and related; Reglan; Toradol; Vancomycin; Zofran; and Latex  Home Medications   Prior to Admission medications  Medication Sig Start Date End Date Taking? Authorizing Provider  FLUoxetine (PROZAC) 20 MG tablet Take 20 mg by mouth daily.   Yes Historical Provider, MD  gabapentin (NEURONTIN) 300 MG capsule Take 300-600 mg by mouth 3 (three) times daily. Take 2 capsules at bedtime   Yes Historical Provider, MD  hydrOXYzine (ATARAX/VISTARIL) 25 MG tablet Take 1 tablet (25 mg total) by mouth every 6 (six) hours as needed for anxiety. 05/05/12  Yes Judie Bonus, MD  LamoTRIgine 200 MG TB24 Take 400 mg by mouth at bedtime.   Yes Historical Provider, MD   BP 125/84  Pulse 61  Temp(Src) 99.3 F (37.4 C) (Oral)  Resp 18  Ht 5\' 6"  (1.676 m)  Wt 143 lb (64.864 kg)  BMI 23.09 kg/m2  SpO2 98%  LMP 02/17/2012 Physical  Exam  Nursing note and vitals reviewed. Constitutional: She is oriented to person, place, and time. She appears well-developed and well-nourished. No distress.  Laughing and joking, NAD  HENT:  Head: Normocephalic and atraumatic.  Right Ear: External ear normal.  Left Ear: External ear normal.  Nose: Nose normal.  Mouth/Throat: Oropharynx is clear and moist.  Eyes: Conjunctivae are normal.  Neck: Normal range of motion.  Cardiovascular: Normal rate, regular rhythm and normal heart sounds.   Pulmonary/Chest: Effort normal and breath sounds normal. No stridor. No respiratory distress. She has no wheezes. She has no rales.  Abdominal: Soft. She exhibits no distension. There is generalized tenderness. There is guarding (voluntary). There is no rigidity and no rebound.  Multiple scars from prior abdominal surgeries.   Musculoskeletal: Normal range of motion.  Neurological: She is alert and oriented to person, place, and time. She has normal strength.  Skin: Skin is warm and dry. She is not diaphoretic. No erythema.  Psychiatric: She has a normal mood and affect. Her behavior is normal.    ED Course  Procedures (including critical care time) Labs Review Labs Reviewed  CBC WITH DIFFERENTIAL - Abnormal; Notable for the following:    Monocytes Absolute 1.2 (*)    All other components within normal limits  BASIC METABOLIC PANEL - Abnormal; Notable for the following:    CO2 18 (*)    All other components within normal limits  URINALYSIS, ROUTINE W REFLEX MICROSCOPIC - Abnormal; Notable for the following:    Bilirubin Urine SMALL (*)    All other components within normal limits  URINE RAPID DRUG SCREEN (HOSP PERFORMED) - Abnormal; Notable for the following:    Opiates POSITIVE (*)    Barbiturates POSITIVE (*)    All other components within normal limits  URINE CULTURE  LIPASE, BLOOD  AMYLASE  HEPATIC FUNCTION PANEL  OCCULT BLOOD X 1 CARD TO LAB, STOOL  POC URINE PREG, ED  POC OCCULT  BLOOD, ED    Imaging Review No results found.   EKG Interpretation None      MDM   Final diagnoses:  Chronic abdominal pain    Patient presents to ED for evaluation of abdominal pain x 2 weeks. She states it is a stabbing pain in her abdomen which feels like her pancreatitis. Patient has been seen several times for this. It appears that her lipase has improved and normalized. Labs are very reassuring. Patient does not have a surgical abdomen. Acute abdominal series pending to rule out any sbo as she has had many abdominal surgeries in the past. I do not feel as though this patient needs a CT scan at this time. Patient  signed out to Mathis Fare, PA-C at change of shift. She will f/u on XR, PO challenge if normal and discharge home. If SBO or other concerning finding that can be addressed. Discussed this plan with the patient who agrees with plan. Patient is hemodynamically stable.   Mora Bellman, PA-C 06/01/13 [phone]

## 2013-06-01 ENCOUNTER — Emergency Department (HOSPITAL_COMMUNITY): Payer: Medicare Other

## 2013-06-01 LAB — URINALYSIS, ROUTINE W REFLEX MICROSCOPIC
GLUCOSE, UA: NEGATIVE mg/dL
HGB URINE DIPSTICK: NEGATIVE
Ketones, ur: NEGATIVE mg/dL
Leukocytes, UA: NEGATIVE
Nitrite: NEGATIVE
PH: 6 (ref 5.0–8.0)
Protein, ur: NEGATIVE mg/dL
Specific Gravity, Urine: 1.022 (ref 1.005–1.030)
Urobilinogen, UA: 0.2 mg/dL (ref 0.0–1.0)

## 2013-06-01 LAB — POC URINE PREG, ED: PREG TEST UR: NEGATIVE

## 2013-06-01 LAB — RAPID URINE DRUG SCREEN, HOSP PERFORMED
Amphetamines: NOT DETECTED
BENZODIAZEPINES: NOT DETECTED
Barbiturates: POSITIVE — AB
COCAINE: NOT DETECTED
Opiates: POSITIVE — AB
Tetrahydrocannabinol: NOT DETECTED

## 2013-06-01 MED ORDER — HYDROCODONE-ACETAMINOPHEN 5-325 MG PO TABS: 1.0000 | ORAL_TABLET | Freq: Four times a day (QID) | ORAL | Status: DC | PRN

## 2013-06-01 MED ORDER — PROMETHAZINE HCL 25 MG RE SUPP: 25.0000 mg | Freq: Four times a day (QID) | RECTAL | Status: DC | PRN

## 2013-06-01 MED ORDER — HYDROMORPHONE HCL PF 1 MG/ML IJ SOLN
1.0000 mg | Freq: Once | INTRAMUSCULAR | Status: AC
  Administered 2013-06-01: 1 mg via INTRAMUSCULAR
  Filled 2013-06-01: qty 1

## 2013-06-01 NOTE — ED Provider Notes (Signed)
Medical screening examination/treatment/procedure(s) were performed by non-physician practitioner and as supervising physician I was immediately available for consultation/collaboration.   EKG Interpretation None        Dagmar Hait, MD 06/01/13 [phone]

## 2013-06-01 NOTE — ED Notes (Signed)
X-ray notified that pt ready for scan, preg. Negative.

## 2013-06-01 NOTE — ED Provider Notes (Signed)
Care assumed from [facility] PA-C at shift change. Pt with chronic abdominal pain. AAS pending. If normal, plan to give PO challenge and d/c home. No emesis since being in the ED.  2:06 AM AAS negative. Pt tolerating PO. Stable for d/c. F/u with her PCP and GI. Return precautions given. Patient states understanding of treatment care plan and is agreeable.   Trevor Mace, PA-C 06/01/13 0207

## 2013-06-01 NOTE — Discharge Instructions (Signed)
Abdominal Pain, Women °Abdominal (stomach, pelvic, or belly) pain can be caused by many things. It is important to tell your doctor: °· The location of the pain. °· Does it come and go or is it present all the time? °· Are there things that start the pain (eating certain foods, exercise)? °· Are there other symptoms associated with the pain (fever, nausea, vomiting, diarrhea)? °All of this is helpful to know when trying to find the cause of the pain. °CAUSES  °· Stomach: virus or bacteria infection, or ulcer. °· Intestine: appendicitis (inflamed appendix), regional ileitis (Crohn's disease), ulcerative colitis (inflamed colon), irritable bowel syndrome, diverticulitis (inflamed diverticulum of the colon), or cancer of the stomach or intestine. °· Gallbladder disease or stones in the gallbladder. °· Kidney disease, kidney stones, or infection. °· Pancreas infection or cancer. °· Fibromyalgia (pain disorder). °· Diseases of the female organs: °· Uterus: fibroid (non-cancerous) tumors or infection. °· Fallopian tubes: infection or tubal pregnancy. °· Ovary: cysts or tumors. °· Pelvic adhesions (scar tissue). °· Endometriosis (uterus lining tissue growing in the pelvis and on the pelvic organs). °· Pelvic congestion syndrome (female organs filling up with blood just before the menstrual period). °· Pain with the menstrual period. °· Pain with ovulation (producing an egg). °· Pain with an IUD (intrauterine device, birth control) in the uterus. °· Cancer of the female organs. °· Functional pain (pain not caused by a disease, may improve without treatment). °· Psychological pain. °· Depression. °DIAGNOSIS  °Your doctor will decide the seriousness of your pain by doing an examination. °· Blood tests. °· X-rays. °· Ultrasound. °· CT scan (computed tomography, special type of X-ray). °· MRI (magnetic resonance imaging). °· Cultures, for infection. °· Barium enema (dye inserted in the large intestine, to better view it with  X-rays). °· Colonoscopy (looking in intestine with a lighted tube). °· Laparoscopy (minor surgery, looking in abdomen with a lighted tube). °· Major abdominal exploratory surgery (looking in abdomen with a large incision). °TREATMENT  °The treatment will depend on the cause of the pain.  °· Many cases can be observed and treated at home. °· Over-the-counter medicines recommended by your caregiver. °· Prescription medicine. °· Antibiotics, for infection. °· Birth control pills, for painful periods or for ovulation pain. °· Hormone treatment, for endometriosis. °· Nerve blocking injections. °· Physical therapy. °· Antidepressants. °· Counseling with a psychologist or psychiatrist. °· Minor or major surgery. °HOME CARE INSTRUCTIONS  °· Do not take laxatives, unless directed by your caregiver. °· Take over-the-counter pain medicine only if ordered by your caregiver. Do not take aspirin because it can cause an upset stomach or bleeding. °· Try a clear liquid diet (broth or water) as ordered by your caregiver. Slowly move to a bland diet, as tolerated, if the pain is related to the stomach or intestine. °· Have a thermometer and take your temperature several times a day, and record it. °· Bed rest and sleep, if it helps the pain. °· Avoid sexual intercourse, if it causes pain. °· Avoid stressful situations. °· Keep your follow-up appointments and tests, as your caregiver orders. °· If the pain does not go away with medicine or surgery, you may try: °· Acupuncture. °· Relaxation exercises (yoga, meditation). °· Group therapy. °· Counseling. °SEEK MEDICAL CARE IF:  °· You notice certain foods cause stomach pain. °· Your home care treatment is not helping your pain. °· You need stronger pain medicine. °· You want your IUD removed. °· You feel faint or   lightheaded. °· You develop nausea and vomiting. °· You develop a rash. °· You are having side effects or an allergy to your medicine. °SEEK IMMEDIATE MEDICAL CARE IF:  °· Your  pain does not go away or gets worse. °· You have a fever. °· Your pain is felt only in portions of the abdomen. The right side could possibly be appendicitis. The left lower portion of the abdomen could be colitis or diverticulitis. °· You are passing blood in your stools (bright red or black tarry stools, with or without vomiting). °· You have blood in your urine. °· You develop chills, with or without a fever. °· You pass out. °MAKE SURE YOU:  °· Understand these instructions. °· Will watch your condition. °· Will get help right away if you are not doing well or get worse. °Document Released: 11/06/2006 Document Revised: 04/03/2011 Document Reviewed: 11/26/2008 °[employer]® Patient Information ©2014 [employer]. ° °

## 2013-06-01 NOTE — ED Provider Notes (Signed)
Medical screening examination/treatment/procedure(s) were performed by non-physician practitioner and as supervising physician I was immediately available for consultation/collaboration.   Dione Booze, MD 06/01/13 [phone]

## 2013-06-02 LAB — URINE CULTURE
Colony Count: NO GROWTH
Culture: NO GROWTH

## 2013-06-08 ENCOUNTER — Emergency Department: Payer: Self-pay | Admitting: Emergency Medicine

## 2013-06-08 LAB — COMPREHENSIVE METABOLIC PANEL
ALK PHOS: 96 U/L
ANION GAP: 4 — AB (ref 7–16)
Albumin: 3.9 g/dL (ref 3.4–5.0)
BILIRUBIN TOTAL: 0.2 mg/dL (ref 0.2–1.0)
BUN: 6 mg/dL — ABNORMAL LOW (ref 7–18)
CALCIUM: 9.3 mg/dL (ref 8.5–10.1)
CO2: 27 mmol/L (ref 21–32)
CREATININE: 0.82 mg/dL (ref 0.60–1.30)
Chloride: 108 mmol/L — ABNORMAL HIGH (ref 98–107)
Glucose: 117 mg/dL — ABNORMAL HIGH (ref 65–99)
Osmolality: 276 (ref 275–301)
POTASSIUM: 3.7 mmol/L (ref 3.5–5.1)
SGOT(AST): 23 U/L (ref 15–37)
SGPT (ALT): 34 U/L (ref 12–78)
SODIUM: 139 mmol/L (ref 136–145)
TOTAL PROTEIN: 7.1 g/dL (ref 6.4–8.2)

## 2013-06-08 LAB — URINALYSIS, COMPLETE
BILIRUBIN, UR: NEGATIVE
Blood: NEGATIVE
Glucose,UR: NEGATIVE mg/dL (ref 0–75)
KETONE: NEGATIVE
Nitrite: NEGATIVE
PROTEIN: NEGATIVE
Ph: 6 (ref 4.5–8.0)
RBC,UR: 1 /HPF (ref 0–5)
Specific Gravity: 1.017 (ref 1.003–1.030)
WBC UR: 2 /HPF (ref 0–5)

## 2013-06-08 LAB — LIPASE, BLOOD: LIPASE: 226 U/L (ref 73–393)

## 2013-06-08 LAB — CBC
HCT: 41.4 % (ref 35.0–47.0)
HGB: 14.4 g/dL (ref 12.0–16.0)
MCH: 33 pg (ref 26.0–34.0)
MCHC: 34.7 g/dL (ref 32.0–36.0)
MCV: 95 fL (ref 80–100)
Platelet: 264 10*3/uL (ref 150–440)
RBC: 4.35 10*6/uL (ref 3.80–5.20)
RDW: 13.2 % (ref 11.5–14.5)
WBC: 8.5 10*3/uL (ref 3.6–11.0)

## 2013-06-24 ENCOUNTER — Emergency Department: Payer: Self-pay | Admitting: Emergency Medicine

## 2013-06-24 LAB — COMPREHENSIVE METABOLIC PANEL
Albumin: 4.4 g/dL (ref 3.4–5.0)
Alkaline Phosphatase: 105 U/L
Anion Gap: 6 — ABNORMAL LOW (ref 7–16)
BUN: 10 mg/dL (ref 7–18)
Bilirubin,Total: 0.3 mg/dL (ref 0.2–1.0)
CREATININE: 0.9 mg/dL (ref 0.60–1.30)
Calcium, Total: 9.1 mg/dL (ref 8.5–10.1)
Chloride: 110 mmol/L — ABNORMAL HIGH (ref 98–107)
Co2: 25 mmol/L (ref 21–32)
EGFR (African American): >60
EGFR (Non-African Amer.): >60
GLUCOSE: 96 mg/dL (ref 65–99)
OSMOLALITY: 280 (ref 275–301)
Potassium: 3.2 mmol/L — ABNORMAL LOW (ref 3.5–5.1)
SGOT(AST): 23 U/L (ref 15–37)
SGPT (ALT): 17 U/L (ref 12–78)
Sodium: 141 mmol/L (ref 136–145)
Total Protein: 8 g/dL (ref 6.4–8.2)

## 2013-06-24 LAB — TROPONIN I: Troponin-I: <0.02 ng/mL

## 2013-06-24 LAB — D-DIMER(ARMC): D-Dimer: 314 ng/ml

## 2013-06-24 LAB — CK TOTAL AND CKMB (NOT AT ARMC)
CK, Total: 217 U/L — ABNORMAL HIGH
CK-MB: <0.5 ng/mL — ABNORMAL LOW (ref 0.5–3.6)

## 2013-06-24 LAB — CBC
HCT: 42.4 % (ref 35.0–47.0)
HGB: 14.7 g/dL (ref 12.0–16.0)
MCH: 33.2 pg (ref 26.0–34.0)
MCHC: 34.7 g/dL (ref 32.0–36.0)
MCV: 96 fL (ref 80–100)
PLATELETS: 329 10*3/uL (ref 150–440)
RBC: 4.43 10*6/uL (ref 3.80–5.20)
RDW: 13.7 % (ref 11.5–14.5)
WBC: 11.6 10*3/uL — AB (ref 3.6–11.0)

## 2013-06-24 LAB — LIPASE, BLOOD: Lipase: 283 U/L (ref 73–393)

## 2013-06-25 ENCOUNTER — Encounter (HOSPITAL_COMMUNITY): Payer: Self-pay | Admitting: Emergency Medicine

## 2013-06-25 ENCOUNTER — Emergency Department (HOSPITAL_COMMUNITY)
Admission: EM | Admit: 2013-06-25 | Discharge: 2013-06-26 | Disposition: A | Discharge status: Home / Self Care | Payer: Medicare Other | Attending: Emergency Medicine | Admitting: Emergency Medicine

## 2013-06-25 ENCOUNTER — Emergency Department (HOSPITAL_COMMUNITY): Payer: Medicare Other

## 2013-06-25 DIAGNOSIS — Z8739 Personal history of other diseases of the musculoskeletal system and connective tissue: Secondary | ICD-10-CM | POA: Insufficient documentation

## 2013-06-25 DIAGNOSIS — R5383 Other fatigue: Secondary | ICD-10-CM

## 2013-06-25 DIAGNOSIS — K921 Melena: Secondary | ICD-10-CM | POA: Insufficient documentation

## 2013-06-25 DIAGNOSIS — R091 Pleurisy: Secondary | ICD-10-CM

## 2013-06-25 DIAGNOSIS — Z9089 Acquired absence of other organs: Secondary | ICD-10-CM | POA: Insufficient documentation

## 2013-06-25 DIAGNOSIS — G40909 Epilepsy, unspecified, not intractable, without status epilepticus: Secondary | ICD-10-CM | POA: Insufficient documentation

## 2013-06-25 DIAGNOSIS — Z79899 Other long term (current) drug therapy: Secondary | ICD-10-CM | POA: Insufficient documentation

## 2013-06-25 DIAGNOSIS — F329 Major depressive disorder, single episode, unspecified: Secondary | ICD-10-CM | POA: Insufficient documentation

## 2013-06-25 DIAGNOSIS — F17200 Nicotine dependence, unspecified, uncomplicated: Secondary | ICD-10-CM | POA: Insufficient documentation

## 2013-06-25 DIAGNOSIS — R109 Unspecified abdominal pain: Secondary | ICD-10-CM | POA: Insufficient documentation

## 2013-06-25 DIAGNOSIS — F3289 Other specified depressive episodes: Secondary | ICD-10-CM | POA: Insufficient documentation

## 2013-06-25 DIAGNOSIS — R112 Nausea with vomiting, unspecified: Secondary | ICD-10-CM | POA: Insufficient documentation

## 2013-06-25 DIAGNOSIS — R5381 Other malaise: Secondary | ICD-10-CM | POA: Insufficient documentation

## 2013-06-25 DIAGNOSIS — R197 Diarrhea, unspecified: Secondary | ICD-10-CM | POA: Insufficient documentation

## 2013-06-25 DIAGNOSIS — Z87442 Personal history of urinary calculi: Secondary | ICD-10-CM | POA: Insufficient documentation

## 2013-06-25 LAB — CBC
HEMATOCRIT: 40.2 % (ref 36.0–46.0)
HEMOGLOBIN: 14.5 g/dL (ref 12.0–15.0)
MCH: 34 pg (ref 26.0–34.0)
MCHC: 36.1 g/dL — ABNORMAL HIGH (ref 30.0–36.0)
MCV: 94.4 fL (ref 78.0–100.0)
Platelets: 324 10*3/uL (ref 150–400)
RBC: 4.26 MIL/uL (ref 3.87–5.11)
RDW: 13.7 % (ref 11.5–15.5)
WBC: 10.9 10*3/uL — ABNORMAL HIGH (ref 4.0–10.5)

## 2013-06-25 LAB — BASIC METABOLIC PANEL
BUN: 10 mg/dL (ref 6–23)
CHLORIDE: 105 meq/L (ref 96–112)
CO2: 22 mEq/L (ref 19–32)
Calcium: 9.6 mg/dL (ref 8.4–10.5)
Creatinine, Ser: 0.74 mg/dL (ref 0.50–1.10)
GFR calc non Af Amer: >90 mL/min (ref >90)
Glucose, Bld: 107 mg/dL — ABNORMAL HIGH (ref 70–99)
POTASSIUM: 3.5 meq/L — AB (ref 3.7–5.3)
Sodium: 139 mEq/L (ref 137–147)

## 2013-06-25 LAB — I-STAT TROPONIN, ED: Troponin i, poc: 0 ng/mL (ref 0.00–0.08)

## 2013-06-25 NOTE — ED Notes (Signed)
Pt presents with Left side chest pain under her Left breast and all over body aches starting yesterday. Pt reports the pain eases off when she pushes on the area. EKG done in triage

## 2013-06-25 NOTE — ED Notes (Signed)
Per [facility], pt from home, pt reports Left side chest pain on inspiration starting yesterday, pt seen here yesterday for the same and discharged home. Pt states less pain if she holds the area that hurts with her hand. VSS BP 109/69, HR 84, 97% RA

## 2013-06-25 NOTE — ED Provider Notes (Signed)
CSN: [id number]     Arrival date & time 06/25/13  1952 History   First MD Initiated Contact with Patient 06/25/13 2341     Chief Complaint  Patient presents with  . Chest Pain   HPI  Sally Walker is a 28 y.o. female with a PMH of fibromyalgia, RA, osteoporosis, kidney stones, SBO, diverticulitis, depression, seizures, drug dependence, and polysubstance abuse who presents to the ED for evaluation of chest pain. History was provided by the patient. Patient states she developed pain under her left ribs yesterday morning when she woke up. Her pain is worse with movement and deep breathing. Patient is a current smoker. No history of DVT, PE, clotting disorder, cancer, recent surgery, immobilization, travel, or estrogen supplementation. Patient states it is better when sitting up. Pain comes and goes and is described as a sharp stabbing pain. Patient did not take anything for pain PTA. Patient given aspirin en route by EMS. Patient denies any similar abdominal pain in the past. She has a hx of SBO, diverticulitis, and kidney stones and her pain is not similar to this in the past. She has also had a cough with green sputum. No hemoptysis. She also has had generalized body aches, diarrhea, hematochezia, nausea, vomiting x 4, and fatigue. She denies any vaginal bleeding/discharge, dysuria, or fever.    Patient has hx of total colectomy, two ileostomy reversals, appendectomy and cholecystectomy, and kidney stone and ovarian cyst removal (per patient).     Past Medical History  Diagnosis Date  . SBO (small bowel obstruction) 2011    "scar tissue rupture on my intestines"   . Diverticulitis   . Depression     Per pt, no history of suicidal ideation  . Seizure disorder     Last seizure in 2012  . Lumbar herniated disc     per patient  . 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 Drug dependence   . Polysubstance abuse   . Rheumatoid arthritis(714.0)   . Osteoporosis   .  Kidney stones   . Fibromyalgia    Past Surgical History  Procedure Laterality Date  . Total colectomy      Done in [place]  . Cholecystectomy      per patient  . Other surgical history      kidney stone removal    Family History  Problem Relation Age of Onset  . Leukemia Father   . Heart disease      paternal GM, GF  . Fibromyalgia Mother   . Heart disease      maternal GM   History  Substance Use Topics  . Smoking status: Current Every Day Smoker -- 1.00 packs/day for 14 years    Types: Cigarettes  . Smokeless tobacco: Not on file  . Alcohol Use: No   OB History   Grav Para Term Preterm Abortions TAB SAB Ect Mult Living                  Review of Systems  Constitutional: Positive for fatigue. Negative for fever, chills, activity change and appetite change.  HENT: Negative for sore throat.   Respiratory: Positive for cough. Negative for chest tightness, shortness of breath and wheezing.   Cardiovascular: Positive for chest pain. Negative for leg swelling.  Gastrointestinal: Positive for nausea, vomiting, abdominal pain, diarrhea and blood in stool. Negative for constipation.  Genitourinary: Negative for dysuria, hematuria, flank pain, decreased urine volume, vaginal bleeding, vaginal discharge and  difficulty urinating.  Musculoskeletal: Positive for myalgias. Negative for back pain.  Neurological: Positive for weakness (generalized). Negative for dizziness, light-headedness and headaches.    Allergies  Adhesive; Fentanyl; Midodrine; Morphine and related; Reglan; Toradol; Vancomycin; Zofran; and Latex  Home Medications   Prior to Admission medications   Medication Sig Start Date End Date Taking? Authorizing Provider  FLUoxetine (PROZAC) 20 MG tablet Take 20 mg by mouth daily.   Yes Historical Provider, MD  gabapentin (NEURONTIN) 300 MG capsule Take 300-600 mg by mouth 3 (three) times daily. Take 2 capsules at bedtime   Yes Historical Provider, MD  hydrOXYzine  (ATARAX/VISTARIL) 25 MG tablet Take 1 tablet (25 mg total) by mouth every 6 (six) hours as needed for anxiety. 05/05/12  Yes Judie Bonus, MD  LamoTRIgine 200 MG TB24 Take 400 mg by mouth at bedtime.   Yes Historical Provider, MD  promethazine (PHENERGAN) 25 MG suppository Place 1 suppository (25 mg total) rectally every 6 (six) hours as needed for nausea or vomiting. 06/01/13  Yes Mora Bellman, PA-C   BP 105/63  Pulse 86  Temp(Src) 97.8 F (36.6 C) (Oral)  Resp 20  Ht 5\' 6"  (1.676 m)  Wt 143 lb (64.864 kg)  BMI 23.09 kg/m2  SpO2 100%  LMP 06/18/2013  Filed Vitals:   06/26/13 0000 06/26/13 0230 06/26/13 0315 06/26/13 0345  BP: 122/81 104/68 100/62 125/77  Pulse: 78 66 67 65  Temp:      TempSrc:      Resp: 19     Height:      Weight:      SpO2: 100% 96% 94% 99%    Physical Exam  Nursing note and vitals reviewed. Constitutional: She is oriented to person, place, and time. She appears well-developed and well-nourished. No distress.  HENT:  Head: Normocephalic and atraumatic.  Right Ear: External ear normal.  Left Ear: External ear normal.  Nose: Nose normal.  Mouth/Throat: Oropharynx is clear and moist. No oropharyngeal exudate.  Eyes: Conjunctivae are normal. Right eye exhibits no discharge. Left eye exhibits no discharge.  Neck: Normal range of motion. Neck supple.  Cardiovascular: Normal rate, regular rhythm and normal heart sounds.  Exam reveals no gallop and no friction rub.   No murmur heard. Pulmonary/Chest: Effort normal and breath sounds normal. No respiratory distress. She has no wheezes. She has no rales. She exhibits tenderness.  Diffuse left lower anterior rib tenderness to palpation. Pain worse with laying flat.   Abdominal: Soft. Bowel sounds are normal. She exhibits no distension and no mass. There is tenderness. There is no rebound and no guarding.  Tenderness to palpation to the LUQ and epigastric region  Genitourinary:  No hemorrhoids or anal  fissures. Soft brown stool present in the rectal vault. No blood. No palpable masses in the rectal vault.   Musculoskeletal: Normal range of motion. She exhibits tenderness. She exhibits no edema.  Neurological: She is alert and oriented to person, place, and time.  Skin: Skin is warm and dry. She is not diaphoretic.     ED Course  Procedures (including critical care time) Labs Review Labs Reviewed  CBC - Abnormal; Notable for the following:    WBC 10.9 (*)    MCHC 36.1 (*)    All other components within normal limits  BASIC METABOLIC PANEL - Abnormal; Notable for the following:    Potassium 3.5 (*)    Glucose, Bld 107 (*)    All other components within normal limits  Rosezena Sensor, ED    Imaging Review Dg Chest 2 View  06/25/2013   CLINICAL DATA:  Cough.  Chest pain.  EXAM: CHEST  2 VIEW  COMPARISON:  06/01/2013  FINDINGS: The heart size and mediastinal contours are within normal limits. Both lungs are clear. The visualized skeletal structures are unremarkable.  IMPRESSION: No active cardiopulmonary disease.   Electronically Signed   By: Amie Portland M.D.   On: 06/25/2013 21:05     EKG Interpretation   Date/Time:  Wednesday June 25 2013 20:13:16 EDT Ventricular Rate:  77 PR Interval:  126 QRS Duration: 84 QT Interval:  352 QTC Calculation: 398 R Axis:   88 Text Interpretation:  Normal sinus rhythm Normal ECG since last tracing no  significant change Confirmed by MILLER  MD, BRIAN ([id number]) on 06/26/2013  1:59:00 AM      Results for orders placed during the hospital encounter of 06/25/13  CBC      Result Value Ref Range   WBC 10.9 (*) 4.0 - 10.5 K/uL   RBC 4.26  3.87 - 5.11 MIL/uL   Hemoglobin 14.5  12.0 - 15.0 g/dL   HCT 16.0  10.9 - 32.3 %   MCV 94.4  78.0 - 100.0 fL   MCH 34.0  26.0 - 34.0 pg   MCHC 36.1 (*) 30.0 - 36.0 g/dL   RDW 55.7  32.2 - 02.5 %   Platelets 324  150 - 400 K/uL  BASIC METABOLIC PANEL      Result Value Ref Range   Sodium 139  137 - 147  mEq/L   Potassium 3.5 (*) 3.7 - 5.3 mEq/L   Chloride 105  96 - 112 mEq/L   CO2 22  19 - 32 mEq/L   Glucose, Bld 107 (*) 70 - 99 mg/dL   BUN 10  6 - 23 mg/dL   Creatinine, Ser 4.27  0.50 - 1.10 mg/dL   Calcium 9.6  8.4 - 06.2 mg/dL   GFR calc non Af Amer >90  >90 mL/min   GFR calc Af Amer >90  >90 mL/min  I-STAT TROPOININ, ED      Result Value Ref Range   Troponin i, poc 0.00  0.00 - 0.08 ng/mL   Comment 3           POC OCCULT BLOOD, ED      Result Value Ref Range   Fecal Occult Bld NEGATIVE  NEGATIVE    MDM   Countess Biebel is a 28 y.o. female with a PMH of fibromyalgia, RA, osteoporosis, kidney stones, SBO, diverticulitis, depression, seizures, drug dependence, and polysubstance abuse who presents to the ED for evaluation of chest pain. Patient has reproducible chest wall pain along with LUQ and epigastric tenderness. Chest x-ray negative for an acute cardiopulmonary process. No risk factors for PE (PERC negative). Troponin negative. EKG negative for any acute ischemic changes. Chest wall pain possibly from coughing. Abdominal exam benign. Patient has an extensive GI history. Abdominal series negative for an acute intraabdominal pathology or obstruction. Will await lipase and hepatic function panel to further evaluate other possible causes of epigastric pain including pancreatitis. Re-evaluate patient's pain. Vital signs stable.    2:00 PM = Signed out care to Dr. Hyacinth Meeker who will await results of lab work and re-check on patient.    Luiz Iron PA-C   This patient was discussed with Dr. Julio Sicks, PA-C 06/27/13 [phone]

## 2013-06-25 NOTE — ED Notes (Signed)
Jessica, PA at bedside

## 2013-06-26 ENCOUNTER — Emergency Department (HOSPITAL_COMMUNITY): Payer: Medicare Other

## 2013-06-26 LAB — URINALYSIS, ROUTINE W REFLEX MICROSCOPIC
BILIRUBIN URINE: NEGATIVE
Glucose, UA: NEGATIVE mg/dL
Ketones, ur: NEGATIVE mg/dL
LEUKOCYTES UA: NEGATIVE
NITRITE: NEGATIVE
Protein, ur: NEGATIVE mg/dL
SPECIFIC GRAVITY, URINE: 1.026 (ref 1.005–1.030)
Urobilinogen, UA: 1 mg/dL (ref 0.0–1.0)
pH: 6 (ref 5.0–8.0)

## 2013-06-26 LAB — URINE MICROSCOPIC-ADD ON

## 2013-06-26 LAB — HEPATIC FUNCTION PANEL
ALBUMIN: 4.4 g/dL (ref 3.5–5.2)
ALK PHOS: 108 U/L (ref 39–117)
ALT: 17 U/L (ref 0–35)
AST: 19 U/L (ref 0–37)
Bilirubin, Direct: <0.2 mg/dL (ref 0.0–0.3)
TOTAL PROTEIN: 7.7 g/dL (ref 6.0–8.3)
Total Bilirubin: <0.2 mg/dL — ABNORMAL LOW (ref 0.3–1.2)

## 2013-06-26 LAB — LIPASE, BLOOD: LIPASE: 86 U/L — AB (ref 11–59)

## 2013-06-26 LAB — PREGNANCY, URINE: Preg Test, Ur: NEGATIVE

## 2013-06-26 LAB — POC OCCULT BLOOD, ED: FECAL OCCULT BLD: NEGATIVE

## 2013-06-26 MED ORDER — SODIUM CHLORIDE 0.9 % IV BOLUS (SEPSIS)
1000.0000 mL | Freq: Once | INTRAVENOUS | Status: AC
  Administered 2013-06-26: 1000 mL via INTRAVENOUS

## 2013-06-26 MED ORDER — HYDROMORPHONE HCL PF 1 MG/ML IJ SOLN
1.0000 mg | Freq: Once | INTRAMUSCULAR | Status: AC
  Administered 2013-06-26: 1 mg via INTRAVENOUS
  Filled 2013-06-26: qty 1

## 2013-06-26 MED ORDER — HYDROCODONE-ACETAMINOPHEN 5-325 MG PO TABS: 2.0000 | ORAL_TABLET | ORAL | Status: DC | PRN

## 2013-06-26 MED ORDER — PROMETHAZINE HCL 25 MG/ML IJ SOLN
25.0000 mg | Freq: Once | INTRAMUSCULAR | Status: AC
  Administered 2013-06-26: 25 mg via INTRAVENOUS
  Filled 2013-06-26: qty 1

## 2013-06-26 NOTE — Discharge Instructions (Signed)
Please call your doctor for a followup appointment within 24-48 hours. When you talk to your doctor please let them know that you were seen in the emergency department and have them acquire all of your records so that they can discuss the findings with you and formulate a treatment plan to fully care for your new and ongoing problems. ° °

## 2013-06-26 NOTE — ED Notes (Signed)
A&Ox4, ambulatory at d/c with slow steady gait, pt refused wheelchair.

## 2013-06-26 NOTE — ED Provider Notes (Signed)
Patient reexamined, her left upper quadrant abdominal tenderness persists and is mild, also has pleuritic pain with breathing, likely pleuritis, and other more significant etiology given the patient's normal workup. She was informed of the results, appear stable for discharge.  No vomiting, no diarrhea, no significant leukocytosis or abnormal vital signs  Filed Vitals:   06/26/13 0000 06/26/13 0230 06/26/13 0315 06/26/13 0345  BP: 122/81 104/68 100/62 125/77  Pulse: 78 66 67 65  Temp:      TempSrc:      Resp: 19     Height:      Weight:      SpO2: 100% 96% 94% 99%    Medical screening examination/treatment/procedure(s) were conducted as a shared visit with non-physician practitioner(s) and myself.  I personally evaluated the patient during the encounter.  Clinical Impression: Pleurisy, Abd pain      Vida Roller, MD 06/26/13 [phone]

## 2013-06-28 ENCOUNTER — Emergency Department: Payer: Self-pay | Admitting: Emergency Medicine

## 2013-06-28 LAB — BASIC METABOLIC PANEL
Anion Gap: 9 (ref 7–16)
BUN: 6 mg/dL — ABNORMAL LOW (ref 7–18)
CREATININE: 0.67 mg/dL (ref 0.60–1.30)
Calcium, Total: 8.6 mg/dL (ref 8.5–10.1)
Chloride: 111 mmol/L — ABNORMAL HIGH (ref 98–107)
Co2: 22 mmol/L (ref 21–32)
EGFR (African American): >60
EGFR (Non-African Amer.): >60
Glucose: 123 mg/dL — ABNORMAL HIGH (ref 65–99)
Osmolality: 282 (ref 275–301)
Potassium: 3.2 mmol/L — ABNORMAL LOW (ref 3.5–5.1)
Sodium: 142 mmol/L (ref 136–145)

## 2013-06-28 LAB — CBC
HCT: 40.4 % (ref 35.0–47.0)
HGB: 13.6 g/dL (ref 12.0–16.0)
MCH: 32.2 pg (ref 26.0–34.0)
MCHC: 33.7 g/dL (ref 32.0–36.0)
MCV: 96 fL (ref 80–100)
PLATELETS: 289 10*3/uL (ref 150–440)
RBC: 4.22 10*6/uL (ref 3.80–5.20)
RDW: 13.8 % (ref 11.5–14.5)
WBC: 9.9 10*3/uL (ref 3.6–11.0)

## 2013-06-28 LAB — TROPONIN I

## 2013-06-28 LAB — SEDIMENTATION RATE: Erythrocyte Sed Rate: 7 mm/hr (ref 0–20)

## 2013-06-28 LAB — D-DIMER(ARMC): D-DIMER: 322 ng/mL

## 2013-06-29 NOTE — ED Provider Notes (Signed)
Medical screening examination/treatment/procedure(s) were performed by non-physician practitioner and as supervising physician I was immediately available for consultation/collaboration.    Vida Roller, MD 06/29/13 [phone]

## 2013-07-02 ENCOUNTER — Inpatient Hospital Stay (HOSPITAL_COMMUNITY)
Admission: EM | Admit: 2013-07-02 | Discharge: 2013-07-04 | DRG: 379 | Disposition: A | Discharge status: Home / Self Care | Payer: Medicare Other | Attending: Internal Medicine | Admitting: Internal Medicine

## 2013-07-02 ENCOUNTER — Encounter (HOSPITAL_COMMUNITY): Payer: Self-pay | Admitting: Emergency Medicine

## 2013-07-02 ENCOUNTER — Emergency Department (HOSPITAL_COMMUNITY): Payer: Medicare Other

## 2013-07-02 DIAGNOSIS — Z79899 Other long term (current) drug therapy: Secondary | ICD-10-CM

## 2013-07-02 DIAGNOSIS — Z806 Family history of leukemia: Secondary | ICD-10-CM

## 2013-07-02 DIAGNOSIS — D649 Anemia, unspecified: Secondary | ICD-10-CM

## 2013-07-02 DIAGNOSIS — Z888 Allergy status to other drugs, medicaments and biological substances status: Secondary | ICD-10-CM

## 2013-07-02 DIAGNOSIS — Z91040 Latex allergy status: Secondary | ICD-10-CM

## 2013-07-02 DIAGNOSIS — G8929 Other chronic pain: Secondary | ICD-10-CM

## 2013-07-02 DIAGNOSIS — K921 Melena: Secondary | ICD-10-CM | POA: Diagnosis present

## 2013-07-02 DIAGNOSIS — Z885 Allergy status to narcotic agent status: Secondary | ICD-10-CM

## 2013-07-02 DIAGNOSIS — M810 Age-related osteoporosis without current pathological fracture: Secondary | ICD-10-CM | POA: Diagnosis present

## 2013-07-02 DIAGNOSIS — G40909 Epilepsy, unspecified, not intractable, without status epilepticus: Secondary | ICD-10-CM | POA: Diagnosis present

## 2013-07-02 DIAGNOSIS — F3289 Other specified depressive episodes: Secondary | ICD-10-CM | POA: Diagnosis present

## 2013-07-02 DIAGNOSIS — F17200 Nicotine dependence, unspecified, uncomplicated: Secondary | ICD-10-CM | POA: Diagnosis present

## 2013-07-02 DIAGNOSIS — F329 Major depressive disorder, single episode, unspecified: Secondary | ICD-10-CM | POA: Diagnosis present

## 2013-07-02 DIAGNOSIS — R109 Unspecified abdominal pain: Secondary | ICD-10-CM

## 2013-07-02 DIAGNOSIS — Z9089 Acquired absence of other organs: Secondary | ICD-10-CM

## 2013-07-02 DIAGNOSIS — Z87442 Personal history of urinary calculi: Secondary | ICD-10-CM

## 2013-07-02 DIAGNOSIS — Z720 Tobacco use: Secondary | ICD-10-CM

## 2013-07-02 DIAGNOSIS — M069 Rheumatoid arthritis, unspecified: Secondary | ICD-10-CM | POA: Diagnosis present

## 2013-07-02 DIAGNOSIS — K920 Hematemesis: Principal | ICD-10-CM | POA: Diagnosis present

## 2013-07-02 DIAGNOSIS — Z881 Allergy status to other antibiotic agents status: Secondary | ICD-10-CM

## 2013-07-02 DIAGNOSIS — Z8249 Family history of ischemic heart disease and other diseases of the circulatory system: Secondary | ICD-10-CM

## 2013-07-02 DIAGNOSIS — K922 Gastrointestinal hemorrhage, unspecified: Secondary | ICD-10-CM

## 2013-07-02 DIAGNOSIS — Z9049 Acquired absence of other specified parts of digestive tract: Secondary | ICD-10-CM

## 2013-07-02 LAB — COMPREHENSIVE METABOLIC PANEL
ALT: 12 U/L (ref 0–35)
AST: 14 U/L (ref 0–37)
Albumin: 3.8 g/dL (ref 3.5–5.2)
Alkaline Phosphatase: 92 U/L (ref 39–117)
BUN: 9 mg/dL (ref 6–23)
CALCIUM: 8.9 mg/dL (ref 8.4–10.5)
CO2: 21 mEq/L (ref 19–32)
Chloride: 107 mEq/L (ref 96–112)
Creatinine, Ser: 0.77 mg/dL (ref 0.50–1.10)
GFR calc Af Amer: >90 mL/min (ref >90)
GFR calc non Af Amer: >90 mL/min (ref >90)
GLUCOSE: 103 mg/dL — AB (ref 70–99)
Potassium: 3.8 mEq/L (ref 3.7–5.3)
SODIUM: 142 meq/L (ref 137–147)
TOTAL PROTEIN: 6.8 g/dL (ref 6.0–8.3)
Total Bilirubin: <0.2 mg/dL — ABNORMAL LOW (ref 0.3–1.2)

## 2013-07-02 LAB — CBC WITH DIFFERENTIAL/PLATELET
BASOS PCT: 0 % (ref 0–1)
Basophils Absolute: 0 10*3/uL (ref 0.0–0.1)
EOS ABS: 0.1 10*3/uL (ref 0.0–0.7)
EOS PCT: 1 % (ref 0–5)
HCT: 35.5 % — ABNORMAL LOW (ref 36.0–46.0)
Hemoglobin: 12.5 g/dL (ref 12.0–15.0)
LYMPHS ABS: 2.4 10*3/uL (ref 0.7–4.0)
Lymphocytes Relative: 23 % (ref 12–46)
MCH: 32.7 pg (ref 26.0–34.0)
MCHC: 35.2 g/dL (ref 30.0–36.0)
MCV: 92.9 fL (ref 78.0–100.0)
Monocytes Absolute: 1 10*3/uL (ref 0.1–1.0)
Monocytes Relative: 9 % (ref 3–12)
Neutro Abs: 6.9 10*3/uL (ref 1.7–7.7)
Neutrophils Relative %: 67 % (ref 43–77)
Platelets: 301 10*3/uL (ref 150–400)
RBC: 3.82 MIL/uL — ABNORMAL LOW (ref 3.87–5.11)
RDW: 13.6 % (ref 11.5–15.5)
WBC: 10.4 10*3/uL (ref 4.0–10.5)

## 2013-07-02 LAB — URINALYSIS, ROUTINE W REFLEX MICROSCOPIC
Bilirubin Urine: NEGATIVE
Glucose, UA: NEGATIVE mg/dL
HGB URINE DIPSTICK: NEGATIVE
Ketones, ur: NEGATIVE mg/dL
NITRITE: NEGATIVE
Protein, ur: NEGATIVE mg/dL
SPECIFIC GRAVITY, URINE: 1.022 (ref 1.005–1.030)
Urobilinogen, UA: 0.2 mg/dL (ref 0.0–1.0)
pH: 6.5 (ref 5.0–8.0)

## 2013-07-02 LAB — POC OCCULT BLOOD, ED: Fecal Occult Bld: POSITIVE — AB

## 2013-07-02 LAB — URINE MICROSCOPIC-ADD ON

## 2013-07-02 LAB — LIPASE, BLOOD: Lipase: 85 U/L — ABNORMAL HIGH (ref 11–59)

## 2013-07-02 LAB — PREGNANCY, URINE: PREG TEST UR: NEGATIVE

## 2013-07-02 MED ORDER — HYDROMORPHONE HCL PF 1 MG/ML IJ SOLN
1.0000 mg | Freq: Once | INTRAMUSCULAR | Status: AC
  Administered 2013-07-02: 1 mg via INTRAVENOUS
  Filled 2013-07-02: qty 1

## 2013-07-02 MED ORDER — SODIUM CHLORIDE 0.9 % IV BOLUS (SEPSIS)
1000.0000 mL | Freq: Once | INTRAVENOUS | Status: AC
  Administered 2013-07-02: 1000 mL via INTRAVENOUS

## 2013-07-02 MED ORDER — FAMOTIDINE IN NACL 20-0.9 MG/50ML-% IV SOLN
20.0000 mg | Freq: Once | INTRAVENOUS | Status: AC
  Administered 2013-07-02: 20 mg via INTRAVENOUS
  Filled 2013-07-02: qty 50

## 2013-07-02 MED ORDER — PROMETHAZINE HCL 25 MG/ML IJ SOLN
25.0000 mg | Freq: Once | INTRAMUSCULAR | Status: AC
  Administered 2013-07-02: 25 mg via INTRAVENOUS
  Filled 2013-07-02: qty 1

## 2013-07-02 NOTE — ED Notes (Signed)
Per EMS: Pt has had abdominal pain, blood in stool, and emesis x 2 days. Pt has hx of surgery to large and small bowel. Pt also diagnosed with pleurisy last week.

## 2013-07-02 NOTE — ED Notes (Signed)
Bed: WA04 Expected date:  Expected time:  Means of arrival:  Comments: ems- abdominal pain

## 2013-07-02 NOTE — ED Provider Notes (Signed)
CSN: [id number]     Arrival date & time 07/02/13  1803 History   First MD Initiated Contact with Patient 07/02/13 1813     Chief Complaint  Patient presents with  . Abdominal Pain     (Consider location/radiation/quality/duration/timing/severity/associated sxs/prior Treatment) HPI Comments: Sally Walker is a 28 y.o. 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 She describes occasional episodes of sharp pain along with with nausea and vomiting coffee ground emesis.  Her stools are liquid and black.   Her pain is in her bilateral lower quadrants, intermittent without radiation.  She denies abdominal distention, dysuria, fevers or chills, weakness.  She has taken no medicines nor has she found any relievers for her symptoms.  She does report generalized fatigue, but denies dizziness.     The history is provided by the patient.    Past Medical History  Diagnosis Date  . SBO (small bowel obstruction) 2011    "scar tissue rupture on my intestines"   . Diverticulitis   . Depression     Per pt, no history of suicidal ideation  . Seizure disorder     Last seizure in 2012  . Lumbar herniated disc     per patient  . 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 Drug dependence   . Polysubstance abuse   . Rheumatoid arthritis(714.0)   . Osteoporosis   . Kidney stones   . Fibromyalgia    Past Surgical History  Procedure Laterality Date  . Total colectomy      Done in [place]  . Cholecystectomy      per patient  . Other surgical history      kidney stone removal    Family History  Problem Relation Age of Onset  . Leukemia Father   . Heart disease      paternal GM, GF  . Fibromyalgia Mother   . Heart disease      maternal GM   History  Substance Use Topics  . Smoking  status: Current Every Day Smoker -- 1.00 packs/day for 14 years    Types: Cigarettes  . Smokeless tobacco: Not on file  . Alcohol Use: No   OB History   Grav Para Term Preterm Abortions TAB SAB Ect Mult Living                 Review of Systems  Constitutional: Positive for fatigue. Negative for fever and chills.  HENT: Negative for congestion and sore throat.   Eyes: Negative.   Respiratory: Negative for chest tightness and shortness of breath.   Cardiovascular: Negative for chest pain.  Gastrointestinal: Positive for nausea, vomiting, abdominal pain, diarrhea and blood in stool.  Genitourinary: Negative.   Musculoskeletal: Negative for arthralgias, joint swelling and neck pain.  Skin: Negative.  Negative for rash and wound.  Neurological: Negative for dizziness, weakness, light-headedness, numbness and headaches.  Psychiatric/Behavioral: Negative.       Allergies  Adhesive; Fentanyl; Midodrine; Morphine and related; Reglan; Toradol; Vancomycin; Zofran; and Latex  Home Medications   Prior to Admission medications   Medication Sig Start Date End Date Taking? Authorizing Provider  citalopram (CELEXA) 40 MG tablet Take 40 mg by mouth daily.    Yes Historical Provider, MD  FLUoxetine (PROZAC) 20 MG tablet Take 20  mg by mouth daily.   Yes Historical Provider, MD  fluvoxaMINE (LUVOX) 100 MG tablet Take 100 mg by mouth at bedtime.  03/15/13  Yes Historical Provider, MD  gabapentin (NEURONTIN) 300 MG capsule Take 300-600 mg by mouth See admin instructions. 1am, 1 at lunch, Take 2 capsules at bedtime   Yes Historical Provider, MD  ibuprofen (ADVIL,MOTRIN) 200 MG tablet Take 800 mg by mouth every 6 (six) hours as needed for moderate pain.   Yes Historical Provider, MD  LamoTRIgine 200 MG TB24 Take 400 mg by mouth at bedtime.   Yes Historical Provider, MD  prazosin (MINIPRESS) 2 MG capsule Take 4 mg by mouth at bedtime.    Yes Historical Provider, MD  risperiDONE (RISPERDAL) 3 MG tablet  Take 3 mg by mouth at bedtime.  11/11/12  Yes Historical Provider, MD  traZODone (DESYREL) 50 MG tablet Take 100 mg by mouth at bedtime as needed for sleep.    Yes Historical Provider, MD  Vitamin D, Ergocalciferol, (DRISDOL) 50000 UNITS CAPS capsule Take 50,000 Units by mouth every 7 (seven) days.  06/15/13 07/15/13 Yes Historical Provider, MD   BP 117/74  Pulse 60  Temp(Src) 97.6 F (36.4 C) (Oral)  Resp 14  SpO2 97%  LMP 06/18/2013 Physical Exam  Nursing note and vitals reviewed. Constitutional: She appears well-developed and well-nourished.  HENT:  Head: Normocephalic and atraumatic.  Eyes: Conjunctivae are normal.  Neck: Normal range of motion.  Cardiovascular: Normal rate, regular rhythm, normal heart sounds and intact distal pulses.   Pulmonary/Chest: Effort normal and breath sounds normal. She has no wheezes.  Abdominal: Soft. Bowel sounds are normal. There is tenderness in the right lower quadrant, suprapubic area and left lower quadrant. There is no rebound and no guarding.  Well healed surgical mid abdominal incision.  Musculoskeletal: Normal range of motion.  Neurological: She is alert.  Skin: Skin is warm and dry.  Psychiatric: She has a normal mood and affect.    ED Course  Procedures (including critical care time) Labs Review Labs Reviewed  CBC WITH DIFFERENTIAL - Abnormal; Notable for the following:    RBC 3.82 (*)    HCT 35.5 (*)    All other components within normal limits  COMPREHENSIVE METABOLIC PANEL - Abnormal; Notable for the following:    Glucose, Bld 103 (*)    Total Bilirubin <0.2 (*)    All other components within normal limits  LIPASE, BLOOD - Abnormal; Notable for the following:    Lipase 85 (*)    All other components within normal limits  URINALYSIS, ROUTINE W REFLEX MICROSCOPIC - Abnormal; Notable for the following:    APPearance CLOUDY (*)    Leukocytes, UA SMALL (*)    All other components within normal limits  URINE MICROSCOPIC-ADD ON -  Abnormal; Notable for the following:    Squamous Epithelial / LPF MANY (*)    Bacteria, UA FEW (*)    All other components within normal limits  POC OCCULT BLOOD, ED - Abnormal; Notable for the following:    Fecal Occult Bld POSITIVE (*)    All other components within normal limits  PREGNANCY, URINE    Imaging Review Dg Abd Acute W/chest  07/02/2013   CLINICAL DATA:  Abdominal pain  EXAM: ACUTE ABDOMEN SERIES (ABDOMEN 2 VIEW & CHEST 1 VIEW)  COMPARISON:  06/26/2013  FINDINGS: There is no evidence of dilated bowel loops or free intraperitoneal air. No radiopaque calculi or other significant radiographic abnormality is seen. Heart size  and mediastinal contours are within normal limits. Both lungs are clear. Surgical clips in the right upper quadrant with bowel staples in the right abdomen.  IMPRESSION: Negative abdominal radiographs.  No acute cardiopulmonary disease.   Electronically Signed   By: Burman Nieves M.D.   On: 07/02/2013 21:59     EKG Interpretation None      MDM   Final diagnoses:  GI bleed  Abdominal pain    Pt with lower abdominal pain associated with lower GI bleed with significant drop in hgb.  Spoke with Dr Russella Dar who will consult patient in the morning.  Requests medical admission.  Spoke with Dr Welton Flakes who will see and admit patient.    Burgess Amor, PA-C 07/03/13 0028

## 2013-07-03 ENCOUNTER — Encounter (HOSPITAL_COMMUNITY)
Admission: EM | Disposition: A | Discharge status: Home / Self Care | Payer: Self-pay | Source: Home / Self Care | Attending: Internal Medicine

## 2013-07-03 ENCOUNTER — Encounter (HOSPITAL_COMMUNITY): Payer: Self-pay | Admitting: *Deleted

## 2013-07-03 DIAGNOSIS — K922 Gastrointestinal hemorrhage, unspecified: Secondary | ICD-10-CM | POA: Diagnosis not present

## 2013-07-03 DIAGNOSIS — G8929 Other chronic pain: Secondary | ICD-10-CM

## 2013-07-03 DIAGNOSIS — D649 Anemia, unspecified: Secondary | ICD-10-CM

## 2013-07-03 DIAGNOSIS — F17200 Nicotine dependence, unspecified, uncomplicated: Secondary | ICD-10-CM

## 2013-07-03 HISTORY — PX: ESOPHAGOGASTRODUODENOSCOPY: SHX5428

## 2013-07-03 LAB — COMPREHENSIVE METABOLIC PANEL
ALT: 9 U/L (ref 0–35)
AST: 9 U/L (ref 0–37)
Albumin: 3 g/dL — ABNORMAL LOW (ref 3.5–5.2)
Alkaline Phosphatase: 78 U/L (ref 39–117)
BUN: 9 mg/dL (ref 6–23)
CHLORIDE: 112 meq/L (ref 96–112)
CO2: 20 meq/L (ref 19–32)
CREATININE: 0.71 mg/dL (ref 0.50–1.10)
Calcium: 8.1 mg/dL — ABNORMAL LOW (ref 8.4–10.5)
GLUCOSE: 103 mg/dL — AB (ref 70–99)
Potassium: 3.5 mEq/L — ABNORMAL LOW (ref 3.7–5.3)
Sodium: 144 mEq/L (ref 137–147)
Total Bilirubin: <0.2 mg/dL — ABNORMAL LOW (ref 0.3–1.2)
Total Protein: 5.6 g/dL — ABNORMAL LOW (ref 6.0–8.3)

## 2013-07-03 LAB — CBC
HCT: 33.3 % — ABNORMAL LOW (ref 36.0–46.0)
Hemoglobin: 11.3 g/dL — ABNORMAL LOW (ref 12.0–15.0)
MCH: 32.3 pg (ref 26.0–34.0)
MCHC: 33.9 g/dL (ref 30.0–36.0)
MCV: 95.1 fL (ref 78.0–100.0)
PLATELETS: 251 10*3/uL (ref 150–400)
RBC: 3.5 MIL/uL — AB (ref 3.87–5.11)
RDW: 13.9 % (ref 11.5–15.5)
WBC: 9 10*3/uL (ref 4.0–10.5)

## 2013-07-03 LAB — HEMOGLOBIN A1C
Hgb A1c MFr Bld: 5.8 % — ABNORMAL HIGH (ref <5.7)
Mean Plasma Glucose: 120 mg/dL — ABNORMAL HIGH (ref <117)

## 2013-07-03 LAB — GLUCOSE, CAPILLARY: Glucose-Capillary: 94 mg/dL (ref 70–99)

## 2013-07-03 LAB — TSH: TSH: 3.17 u[IU]/mL (ref 0.350–4.500)

## 2013-07-03 SURGERY — EGD (ESOPHAGOGASTRODUODENOSCOPY)
Anesthesia: Moderate Sedation

## 2013-07-03 MED ORDER — HYDROMORPHONE HCL PF 1 MG/ML IJ SOLN
0.5000 mg | INTRAMUSCULAR | Status: DC | PRN
  Administered 2013-07-03 – 2013-07-04 (×5): 0.5 mg via INTRAVENOUS
  Filled 2013-07-03 (×5): qty 1

## 2013-07-03 MED ORDER — POTASSIUM CHLORIDE 10 MEQ/100ML IV SOLN
10.0000 meq | INTRAVENOUS | Status: AC
  Administered 2013-07-03 (×3): 10 meq via INTRAVENOUS
  Filled 2013-07-03 (×3): qty 100

## 2013-07-03 MED ORDER — ACETAMINOPHEN 650 MG RE SUPP: 650.0000 mg | Freq: Four times a day (QID) | RECTAL | Status: DC | PRN

## 2013-07-03 MED ORDER — MIDAZOLAM HCL 10 MG/2ML IJ SOLN
INTRAMUSCULAR | Status: AC
  Filled 2013-07-03: qty 2

## 2013-07-03 MED ORDER — MEPERIDINE HCL 25 MG/ML IJ SOLN
INTRAMUSCULAR | Status: DC | PRN
  Administered 2013-07-03: 25 mg via INTRAVENOUS

## 2013-07-03 MED ORDER — GABAPENTIN 300 MG PO CAPS
600.0000 mg | ORAL_CAPSULE | Freq: Every day | ORAL | Status: DC
  Administered 2013-07-03 (×2): 600 mg via ORAL
  Filled 2013-07-03 (×3): qty 2

## 2013-07-03 MED ORDER — DIPHENHYDRAMINE HCL 50 MG/ML IJ SOLN
INTRAMUSCULAR | Status: AC
  Filled 2013-07-03: qty 1

## 2013-07-03 MED ORDER — ALUM & MAG HYDROXIDE-SIMETH 200-200-20 MG/5ML PO SUSP: 30.0000 mL | Freq: Four times a day (QID) | ORAL | Status: DC | PRN

## 2013-07-03 MED ORDER — LAMOTRIGINE 200 MG PO TABS
200.0000 mg | ORAL_TABLET | Freq: Two times a day (BID) | ORAL | Status: DC
  Administered 2013-07-03 – 2013-07-04 (×4): 200 mg via ORAL
  Filled 2013-07-03 (×5): qty 1

## 2013-07-03 MED ORDER — SORBITOL 70 % SOLN
30.0000 mL | Freq: Every day | Status: DC | PRN
  Filled 2013-07-03: qty 30

## 2013-07-03 MED ORDER — LAMOTRIGINE ER 200 MG PO TB24: 400.0000 mg | ORAL_TABLET | Freq: Every day | ORAL | Status: DC

## 2013-07-03 MED ORDER — MEPERIDINE HCL 100 MG/ML IJ SOLN
INTRAMUSCULAR | Status: AC
  Filled 2013-07-03: qty 1

## 2013-07-03 MED ORDER — VITAMIN D (ERGOCALCIFEROL) 1.25 MG (50000 UNIT) PO CAPS: 50000.0000 [IU] | ORAL_CAPSULE | ORAL | Status: DC

## 2013-07-03 MED ORDER — TRAZODONE HCL 50 MG PO TABS
100.0000 mg | ORAL_TABLET | Freq: Every evening | ORAL | Status: DC | PRN
  Administered 2013-07-03: 100 mg via ORAL
  Filled 2013-07-03: qty 2

## 2013-07-03 MED ORDER — CITALOPRAM HYDROBROMIDE 40 MG PO TABS
40.0000 mg | ORAL_TABLET | Freq: Every day | ORAL | Status: DC
  Administered 2013-07-03 – 2013-07-04 (×2): 40 mg via ORAL
  Filled 2013-07-03 (×2): qty 1

## 2013-07-03 MED ORDER — NICOTINE 21 MG/24HR TD PT24
21.0000 mg | MEDICATED_PATCH | Freq: Every day | TRANSDERMAL | Status: DC
  Administered 2013-07-03 – 2013-07-04 (×2): 21 mg via TRANSDERMAL
  Filled 2013-07-03 (×2): qty 1

## 2013-07-03 MED ORDER — GABAPENTIN 300 MG PO CAPS
300.0000 mg | ORAL_CAPSULE | ORAL | Status: DC
  Administered 2013-07-03 – 2013-07-04 (×3): 300 mg via ORAL
  Filled 2013-07-03 (×5): qty 1

## 2013-07-03 MED ORDER — ADULT MULTIVITAMIN W/MINERALS CH
1.0000 | ORAL_TABLET | Freq: Every day | ORAL | Status: DC
  Administered 2013-07-03 – 2013-07-04 (×2): 1 via ORAL
  Filled 2013-07-03 (×2): qty 1

## 2013-07-03 MED ORDER — SODIUM CHLORIDE 0.9 % IJ SOLN
3.0000 mL | Freq: Two times a day (BID) | INTRAMUSCULAR | Status: DC
  Administered 2013-07-04: 3 mL via INTRAVENOUS

## 2013-07-03 MED ORDER — ONDANSETRON HCL 4 MG PO TABS: 4.0000 mg | ORAL_TABLET | Freq: Four times a day (QID) | ORAL | Status: DC | PRN

## 2013-07-03 MED ORDER — RISPERIDONE 3 MG PO TABS
3.0000 mg | ORAL_TABLET | Freq: Every day | ORAL | Status: DC
  Administered 2013-07-03 (×2): 3 mg via ORAL
  Filled 2013-07-03 (×3): qty 1

## 2013-07-03 MED ORDER — BUTAMBEN-TETRACAINE-BENZOCAINE 2-2-14 % EX AERO
INHALATION_SPRAY | CUTANEOUS | Status: DC | PRN
  Administered 2013-07-03: 2 via TOPICAL

## 2013-07-03 MED ORDER — MIDAZOLAM HCL 10 MG/2ML IJ SOLN
INTRAMUSCULAR | Status: DC | PRN
  Administered 2013-07-03: 1 mg via INTRAVENOUS
  Administered 2013-07-03: 2 mg via INTRAVENOUS

## 2013-07-03 MED ORDER — DIPHENHYDRAMINE HCL 50 MG/ML IJ SOLN
INTRAMUSCULAR | Status: DC | PRN
  Administered 2013-07-03: 25 mg via INTRAVENOUS

## 2013-07-03 MED ORDER — FLUVOXAMINE MALEATE 100 MG PO TABS
100.0000 mg | ORAL_TABLET | Freq: Every day | ORAL | Status: DC
  Filled 2013-07-03 (×3): qty 1

## 2013-07-03 MED ORDER — PANTOPRAZOLE SODIUM 40 MG IV SOLR
40.0000 mg | Freq: Two times a day (BID) | INTRAVENOUS | Status: DC
  Administered 2013-07-03 – 2013-07-04 (×2): 40 mg via INTRAVENOUS
  Filled 2013-07-03 (×4): qty 40

## 2013-07-03 MED ORDER — MORPHINE SULFATE 2 MG/ML IJ SOLN: 2.0000 mg | INTRAMUSCULAR | Status: DC | PRN

## 2013-07-03 MED ORDER — SODIUM CHLORIDE 0.9 % IV SOLN
INTRAVENOUS | Status: DC
  Administered 2013-07-03 (×2): via INTRAVENOUS

## 2013-07-03 MED ORDER — VITAMIN B-1 100 MG PO TABS
100.0000 mg | ORAL_TABLET | Freq: Every day | ORAL | Status: DC
  Administered 2013-07-03 – 2013-07-04 (×2): 100 mg via ORAL
  Filled 2013-07-03 (×2): qty 1

## 2013-07-03 MED ORDER — FOLIC ACID 1 MG PO TABS
1.0000 mg | ORAL_TABLET | Freq: Every day | ORAL | Status: DC
  Administered 2013-07-03 – 2013-07-04 (×2): 1 mg via ORAL
  Filled 2013-07-03 (×2): qty 1

## 2013-07-03 MED ORDER — HYDROCODONE-ACETAMINOPHEN 5-325 MG PO TABS
1.0000 | ORAL_TABLET | ORAL | Status: DC | PRN
  Administered 2013-07-03 – 2013-07-04 (×4): 2 via ORAL
  Filled 2013-07-03 (×4): qty 2

## 2013-07-03 MED ORDER — ACETAMINOPHEN 325 MG PO TABS: 650.0000 mg | ORAL_TABLET | Freq: Four times a day (QID) | ORAL | Status: DC | PRN

## 2013-07-03 MED ORDER — PRAZOSIN HCL 2 MG PO CAPS
4.0000 mg | ORAL_CAPSULE | Freq: Every day | ORAL | Status: DC
  Administered 2013-07-03 (×2): 4 mg via ORAL
  Filled 2013-07-03 (×3): qty 2

## 2013-07-03 MED ORDER — ONDANSETRON HCL 4 MG/2ML IJ SOLN: 4.0000 mg | Freq: Four times a day (QID) | INTRAMUSCULAR | Status: DC | PRN

## 2013-07-03 NOTE — Plan of Care (Signed)
Problem: Phase I Progression Outcomes Goal: Pain controlled with appropriate interventions Outcome: Progressing Pt medication has helped with pain to abd

## 2013-07-03 NOTE — H&P (Signed)
[facility] History and Physical  Sally Walker VOJ:[id number] DOB: 1985/02/24 DOA: 07/02/2013  Referring physician: Pricilla Loveless, MD PCP: Rolm Gala, MD   Chief Complaint: Abdominal Pain  HPI: Sally Walker is a 28 y.o. female with prior history of colectomy and ileostomy with reversal about 2 years ago presents to the ED with pain in her stomach. Patient states pain is located in the epigastric area mainly not sure about relationship to food. Patient states that she has had associated nausea. She has not vomited any frank blood but has had some coffee ground emesis. She has noted diarrhea and also has noted black tarry stools. She states there is associated pain in her chest. Patient has been on Ibuprofen for chronic pain and she states that she has been taking this every 6 hours. Patient has had some back pain also. She states that she does smoke and she has not been drinking. She has not had such severe symptoms in the past. In the ED she was noted to have a new drop in her hemoglobin from 14.5 gm to 12.5 gm and is therefore being admitted for further evaluation. Currently appears to be hemodynamically stable.   Review of Systems:  Constitutional:  No weight loss Fevers, chills, fatigue.  HEENT:  No headaches Cardio-vascular:  ++chest pain, no Orthopnea, PND, swelling in lower extremities palpitations  GI:  ++heartburn, indigestion, ++abdominal pain, ++nausea, no vomiting, ++diarrhea, ++change in bowel habits, ++loss of appetite  Resp:  No shortness of breath with exertion or at rest. No cough Skin:  no rash or lesions.  GU:  no dysuria, change in color of urine.  Musculoskeletal:  No joint pain or swelling. No decreased range of motion. ++back pain.  Psych:  No change in mood or affect. No depression or anxiety. No memory loss.   Past Medical History  Diagnosis Date  . SBO (small bowel obstruction) 2011    "scar tissue rupture on my intestines"   .  Diverticulitis   . Depression     Per pt, no history of suicidal ideation  . Seizure disorder     Last seizure in 2012  . Lumbar herniated disc     per patient  . 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 Drug dependence   . Polysubstance abuse   . Rheumatoid arthritis(714.0)   . Osteoporosis   . Kidney stones   . Fibromyalgia    Past Surgical History  Procedure Laterality Date  . Total colectomy      Done in [place]  . Cholecystectomy      per patient  . Other surgical history      kidney stone removal    Social History:  reports that she has been smoking Cigarettes.  She has a 14 pack-year smoking history. She does not have any smokeless tobacco history on file. She reports that she does not drink alcohol or use illicit drugs.  Allergies  Allergen Reactions  . Adhesive [Tape] Itching  . Fentanyl Other (See Comments)    REACTION:  unknown  . Midodrine Other (See Comments)    REACTION:  unknown  . Morphine And Related Other (See Comments)    REACTION:  unknown  . Reglan [Metoclopramide] Itching and Nausea And Vomiting  . Toradol [Ketorolac Tromethamine] Itching  . Vancomycin Hives  . Zofran [Ondansetron Hcl] Itching  . Latex Itching and Rash    Family History  Problem Relation Age of Onset  . Leukemia Father   .  Heart disease      paternal GM, GF  . Fibromyalgia Mother   . Heart disease      maternal GM     Prior to Admission medications   Medication Sig Start Date End Date Taking? Authorizing Provider  citalopram (CELEXA) 40 MG tablet Take 40 mg by mouth daily.    Yes Historical Provider, MD  FLUoxetine (PROZAC) 20 MG tablet Take 20 mg by mouth daily.   Yes Historical Provider, MD  fluvoxaMINE (LUVOX) 100 MG tablet Take 100 mg by mouth at bedtime.  03/15/13  Yes Historical Provider, MD  gabapentin (NEURONTIN) 300 MG capsule Take 300-600 mg by mouth See admin instructions. 1am, 1 at lunch, Take 2 capsules at bedtime   Yes Historical Provider,  MD  ibuprofen (ADVIL,MOTRIN) 200 MG tablet Take 800 mg by mouth every 6 (six) hours as needed for moderate pain.   Yes Historical Provider, MD  LamoTRIgine 200 MG TB24 Take 400 mg by mouth at bedtime.   Yes Historical Provider, MD  prazosin (MINIPRESS) 2 MG capsule Take 4 mg by mouth at bedtime.    Yes Historical Provider, MD  risperiDONE (RISPERDAL) 3 MG tablet Take 3 mg by mouth at bedtime.  11/11/12  Yes Historical Provider, MD  traZODone (DESYREL) 50 MG tablet Take 100 mg by mouth at bedtime as needed for sleep.    Yes Historical Provider, MD  Vitamin D, Ergocalciferol, (DRISDOL) 50000 UNITS CAPS capsule Take 50,000 Units by mouth every 7 (seven) days.  06/15/13 07/15/13 Yes Historical Provider, MD   Physical Exam: Filed Vitals:   07/02/13 2245  BP: 117/74  Pulse: 60  Temp: 97.6 F (36.4 C)  Resp: 14    BP 117/74  Pulse 60  Temp(Src) 97.6 F (36.4 C) (Oral)  Resp 14  SpO2 97%  LMP 06/18/2013  General:  Appears calm and comfortable Eyes: PERRL, normal lids, irises & conjunctiva ENT: grossly normal hearing, lips & tongue Neck: no LAD, masses or thyromegaly Cardiovascular: RRR, no m/r/g. No LE edema. Respiratory: CTA bilaterally, no w/r/r. Normal respiratory effort. Abdomen: soft, tender in epigastric area no rebound or guarding Skin: no rash or induration seen on limited exam Musculoskeletal: grossly normal tone BUE/BLE Psychiatric: grossly normal mood and affect, speech fluent and appropriate Neurologic: grossly non-focal.          Labs on Admission:  Basic Metabolic Panel:  Recent Labs Lab 07/02/13 1944  NA 142  K 3.8  CL 107  CO2 21  GLUCOSE 103*  BUN 9  CREATININE 0.77  CALCIUM 8.9   Liver Function Tests:  Recent Labs Lab 07/02/13 1944  AST 14  ALT 12  ALKPHOS 92  BILITOT <0.2*  PROT 6.8  ALBUMIN 3.8    Recent Labs Lab 07/02/13 1944  LIPASE 85*   No results found for this basename: AMMONIA,  in the last 168 hours CBC:  Recent Labs Lab  07/02/13 1944  WBC 10.4  NEUTROABS 6.9  HGB 12.5  HCT 35.5*  MCV 92.9  PLT 301   Cardiac Enzymes: No results found for this basename: CKTOTAL, CKMB, CKMBINDEX, TROPONINI,  in the last 168 hours  BNP (last 3 results) No results found for this basename: PROBNP,  in the last 8760 hours CBG: No results found for this basename: GLUCAP,  in the last 168 hours  Radiological Exams on Admission: Dg Abd Acute W/chest  07/02/2013   CLINICAL DATA:  Abdominal pain  EXAM: ACUTE ABDOMEN SERIES (ABDOMEN 2 VIEW & CHEST  1 VIEW)  COMPARISON:  06/26/2013  FINDINGS: There is no evidence of dilated bowel loops or free intraperitoneal air. No radiopaque calculi or other significant radiographic abnormality is seen. Heart size and mediastinal contours are within normal limits. Both lungs are clear. Surgical clips in the right upper quadrant with bowel staples in the right abdomen.  IMPRESSION: Negative abdominal radiographs.  No acute cardiopulmonary disease.   Electronically Signed   By: Burman Nieves M.D.   On: 07/02/2013 21:59    Assessment/Plan Principal Problem:   GI bleed Active Problems:   Anemia   Chronic pain   1. GI bleed -likely related to NSAIDs usage -will place on telemetry -will hydrate with NS -recheck labs in am -GI  consultation  2. Anemia -likely related to above -will continue with hydration and monitor Hgb  3. Chronic Pain -pain control -avoid NSAIDs   Code Status: Full Code (must indicate code status--if unknown or must be presumed, indicate so) Family Communication: No family (indicate person spoken with, if applicable, with phone number if by telephone) Disposition Plan: Home (indicate anticipated LOS)  Time spent:  [facility] Pager [phone]  **Disclaimer: This note may have been dictated with voice recognition software. Similar sounding words can inadvertently be transcribed and this note may contain transcription errors which  may not have been corrected upon publication of note.**

## 2013-07-03 NOTE — Consult Note (Signed)
Consultation  Referring Provider: [facility] Primary Care Physician:  Rolm Gala, MD Primary Gastroenterologist:   Recently established care in [facility] (Dr. Kyla Balzarine?)      Reason for Consultation: GI bleed             HPI:   Sally Walker is a 28 y.o. female presenting with melena and coffee ground emesis. Patient has a history of colectomy with ileostomy two years ago in [place], done for constipation. Ileostomy reversed but she required a second ileostomy for unclear reasons. She gives a history of post-op SBO as well.   Patient recently saw a GI in [place], [place] for abdominal pain. She has chronic abdominal pain. Bowels chronically loose since colectomy. She was scheduled to have EGD tomorrow but presented to ED with hematemesis and melena. She takes frequent Ibuprofen for abdominal pain. Patient has a significant psychiatrist history. She has a history of substance abuse as well.   Past Medical History  Diagnosis Date  . SBO (small bowel obstruction) 2011    "scar tissue rupture on my intestines"   . Diverticulitis   . Depression     Per pt, no history of suicidal ideation  . Seizure disorder     Last seizure in 2012  . Lumbar herniated disc     per patient  . 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 Drug dependence   . Polysubstance abuse   . Rheumatoid arthritis(714.0)   . Osteoporosis   . Kidney stones   . Fibromyalgia     Past Surgical History  Procedure Laterality Date  . Total colectomy      Done in [place]  . Cholecystectomy      per patient  . Other surgical history      kidney stone removal     Family History  Problem Relation Age of Onset  . Leukemia Father   . Heart disease      paternal GM, GF  . Fibromyalgia Mother   . Heart disease      maternal GM  Crohn's  In cousin No colon cancer  History  Substance Use Topics  . Smoking status: Current Every Day Smoker -- 1.00 packs/day for 14 years    Types:  Cigarettes  . Smokeless tobacco: Not on file  . Alcohol Use: No    Prior to Admission medications   Medication Sig Start Date End Date Taking? Authorizing Provider  citalopram (CELEXA) 40 MG tablet Take 40 mg by mouth daily.    Yes Historical Provider, MD  FLUoxetine (PROZAC) 20 MG tablet Take 20 mg by mouth daily.   Yes Historical Provider, MD  fluvoxaMINE (LUVOX) 100 MG tablet Take 100 mg by mouth at bedtime.  03/15/13  Yes Historical Provider, MD  gabapentin (NEURONTIN) 300 MG capsule Take 300-600 mg by mouth See admin instructions. 1am, 1 at lunch, Take 2 capsules at bedtime   Yes Historical Provider, MD  ibuprofen (ADVIL,MOTRIN) 200 MG tablet Take 800 mg by mouth every 6 (six) hours as needed for moderate pain.   Yes Historical Provider, MD  LamoTRIgine 200 MG TB24 Take 400 mg by mouth at bedtime.   Yes Historical Provider, MD  prazosin (MINIPRESS) 2 MG capsule Take 4 mg by mouth at bedtime.    Yes Historical Provider, MD  risperiDONE (RISPERDAL) 3 MG tablet Take 3 mg by mouth at bedtime.  11/11/12  Yes Historical Provider, MD  traZODone (DESYREL) 50 MG tablet Take  100 mg by mouth at bedtime as needed for sleep.    Yes Historical Provider, MD  Vitamin D, Ergocalciferol, (DRISDOL) 50000 UNITS CAPS capsule Take 50,000 Units by mouth every 7 (seven) days.  06/15/13 07/15/13 Yes Historical Provider, MD    Current Facility-Administered Medications  Medication Dose Route Frequency Provider Last Rate Last Dose  . 0.9 %  sodium chloride infusion   Intravenous Continuous Yevonne Pax, MD 100 mL/hr at 07/03/13 0242    . acetaminophen (TYLENOL) tablet 650 mg  650 mg Oral Q6H PRN Yevonne Pax, MD       Or  . acetaminophen (TYLENOL) suppository 650 mg  650 mg Rectal Q6H PRN Yevonne Pax, MD      . alum & mag hydroxide-simeth (MAALOX/MYLANTA) 200-200-20 MG/5ML suspension 30 mL  30 mL Oral Q6H PRN Yevonne Pax, MD      . citalopram (CELEXA) tablet 40 mg  40 mg Oral Daily Yevonne Pax, MD      .  fluvoxaMINE (LUVOX) tablet 100 mg  100 mg Oral QHS Yevonne Pax, MD      . folic acid (FOLVITE) tablet 1 mg  1 mg Oral Daily Yevonne Pax, MD      . gabapentin (NEURONTIN) capsule 300 mg  300 mg Oral 2 times per day Yevonne Pax, MD   300 mg at 07/03/13 0749  . gabapentin (NEURONTIN) capsule 600 mg  600 mg Oral QHS Yevonne Pax, MD   600 mg at 07/03/13 0242  . HYDROcodone-acetaminophen (NORCO/VICODIN) 5-325 MG per tablet 1-2 tablet  1-2 tablet Oral Q4H PRN Yevonne Pax, MD      . HYDROmorphone (DILAUDID) injection 0.5 mg  0.5 mg Intravenous Q3H PRN Roma Kayser Schorr, NP   0.5 mg at 07/03/13 0751  . lamoTRIgine (LAMICTAL) tablet 200 mg  200 mg Oral BID Yevonne Pax, MD   200 mg at 07/03/13 0243  . multivitamin with minerals tablet 1 tablet  1 tablet Oral Daily Yevonne Pax, MD      . nicotine (NICODERM CQ - dosed in mg/24 hours) patch 21 mg  21 mg Transdermal Daily Leanne Chang, NP      . pantoprazole (PROTONIX) injection 40 mg  40 mg Intravenous Q12H Belkys A Regalado, MD      . prazosin (MINIPRESS) capsule 4 mg  4 mg Oral QHS Yevonne Pax, MD   4 mg at 07/03/13 0243  . risperiDONE (RISPERDAL) tablet 3 mg  3 mg Oral QHS Yevonne Pax, MD   3 mg at 07/03/13 0243  . sodium chloride 0.9 % injection 3 mL  3 mL Intravenous Q12H Yevonne Pax, MD      . sorbitol 70 % solution 30 mL  30 mL Oral Daily PRN Yevonne Pax, MD      . thiamine (VITAMIN B-1) tablet 100 mg  100 mg Oral Daily Yevonne Pax, MD      . traZODone (DESYREL) tablet 100 mg  100 mg Oral QHS PRN Yevonne Pax, MD   100 mg at 07/03/13 0256  . [START ON 07/06/2013] Vitamin D (Ergocalciferol) (DRISDOL) capsule 50,000 Units  50,000 Units Oral Q7 days Yevonne Pax, MD        Allergies as of 07/02/2013 - Review Complete 07/02/2013  Allergen Reaction Noted  . Adhesive [tape] Itching 04/19/2012  . Fentanyl Other (See Comments) 04/19/2012  . Midodrine Other (See Comments) 04/19/2012  . Morphine  and related Other (See Comments)  04/19/2012  . Reglan [metoclopramide] Itching and Nausea And Vomiting 05/31/2013  . Toradol [ketorolac tromethamine] Itching 04/30/2012  . Vancomycin Hives 04/30/2012  . Zofran [ondansetron hcl] Itching 05/25/2013  . Latex Itching and Rash 04/19/2012    Review of Systems:    All systems reviewed and negative except where noted in HPI.   Physical Exam:  Vital signs in last 24 hours: Temp:  [97.6 F (36.4 C)-98.3 F (36.8 C)] 97.8 F (36.6 C) (06/11 0504) Pulse Rate:  [60-72] 72 (06/11 0504) Resp:  [14-16] 14 (06/11 0504) BP: (110-117)/(59-74) 110/59 mmHg (06/11 0504) SpO2:  [97 %-98 %] 98 % (06/11 0504) Weight:  [140 lb 9.6 oz (63.776 kg)] 140 lb 9.6 oz (63.776 kg) (06/11 0140) Last BM Date: 07/02/13 General:   28 year old white female in NAD Head:  Normocephalic and atraumatic. Eyes:   No icterus.   Conjunctiva pink. Ears:  Normal auditory acuity. Neck:  Supple; no masses felt Lungs:  Respirations even and unlabored. Lungs clear to auscultation bilaterally.   No wheezes, crackles, or rhonchi.  Heart:  Regular rate and rhythm;  murmur heard. Abdomen:  Soft, nondistended, nontender. Normal bowel sounds. No appreciable masses or hepatomegaly. Significant amount of surgical scarring on abdomen Rectal:  No stool or blood in vault. Gloved finger heme negative.   Msk:  Symmetrical without gross deformities.  Extremities:  Without edema. Neurologic:  Alert and  oriented x4;  grossly normal neurologically. Skin:  Intact without significant lesions or rashes. Cervical Nodes:  No significant cervical adenopathy. Psych:  Alert and cooperative. Flat affect.  LAB RESULTS:  Recent Labs  07/02/13 1944 07/03/13 0417  WBC 10.4 9.0  HGB 12.5 11.3*  HCT 35.5* 33.3*  PLT 301 251   BMET  Recent Labs  07/02/13 1944 07/03/13 0417  NA 142 144  K 3.8 3.5*  CL 107 112  CO2 21 20  GLUCOSE 103* 103*  BUN 9 9  CREATININE 0.77 0.71  CALCIUM 8.9 8.1*   LFT  Recent Labs   07/03/13 0417  PROT 5.6*  ALBUMIN 3.0*  AST 9  ALT 9  ALKPHOS 78  BILITOT <0.2*   STUDIES: Dg Abd Acute W/chest  07/02/2013   CLINICAL DATA:  Abdominal pain  EXAM: ACUTE ABDOMEN SERIES (ABDOMEN 2 VIEW & CHEST 1 VIEW)  COMPARISON:  06/26/2013  FINDINGS: There is no evidence of dilated bowel loops or free intraperitoneal air. No radiopaque calculi or other significant radiographic abnormality is seen. Heart size and mediastinal contours are within normal limits. Both lungs are clear. Surgical clips in the right upper quadrant with bowel staples in the right abdomen.  IMPRESSION: Negative abdominal radiographs.  No acute cardiopulmonary disease.   Electronically Signed   By: Burman Nieves M.D.   On: 07/02/2013 21:59     Impression / Plan:   24. 28 year old female admitted with reports of melena and hematemesis. BUN normal. No stool / blood on DRE. Her hgb has dropped and she does take Ibuprofen on a daily basis. Will proceed with EGD today for further evaluation. Will make NPO. Continue BID PPI.   2. Mild normocytic anemia. Hgb 12.4 in ED, down from baseline of around 14. Normal menses. She presents with complaints of hematemesis and melena. No evidence for major GI bleed at this point. Further recommendations to follow EGD.   3. Extensive psychiatric  History including, but not limited to depression, drug overdose, substance abuse. On multiple psych  medications.   4. Complex surgical history including colectomy and ileostomy with reversal x2 in [place] a few years ago.   Thanks   LOS: 1 day   Willette Cluster  07/03/2013, 8:44 AM  GI Attending Note   Chart was reviewed and patient was examined. X-rays and lab were reviewed.    I agree with management and plans.  Limited hematemesis and GI bleeding may be due to a Mallory-Weiss tear or active peptic disease.  Patient is no longer overtly bleeding.  Plan as written above  Barbette Hair. Arlyce Dice, M.D., [facility] Cell [phone]  [phone]

## 2013-07-03 NOTE — Progress Notes (Signed)
Pt received dilaudid for pain. After taking the amount needed vial was thrown on sharp container and was unable to scan med.

## 2013-07-03 NOTE — Progress Notes (Signed)
No abnormalities on EGD.  No fresh or old blood.  Recommend - f/u hemeoccults

## 2013-07-03 NOTE — Op Note (Signed)
[facility] [address] [place], [address]   ENDOSCOPY PROCEDURE REPORT  PATIENT: Sally, Walker  MR#: [id number] BIRTHDATE: [birth year] , 28  yrs. old GENDER: Female ENDOSCOPIST: Louis Meckel, MD REFERRED BY: PROCEDURE DATE:  07/03/2013 PROCEDURE:  EGD, diagnostic ASA CLASS:     Class II INDICATIONS:  Hematemesis. MEDICATIONS: These medications were titrated to patient response per physician's verbal order, Fentanyl-Quick Pick, Versed 3 mg IV, Demerol 25 mg IV, and Benadryl 25 mg IV TOPICAL ANESTHETIC: Cetacaine Spray  DESCRIPTION OF PROCEDURE: After the risks benefits and alternatives of the procedure were thoroughly explained, informed consent was obtained.  The Pentax Gastroscope [id number] endoscope was introduced through the mouth and advanced to the third portion of the duodenum. Without limitations.  The instrument was slowly withdrawn as the mucosa was fully examined.      The upper, middle and distal third of the esophagus were carefully inspected and no abnormalities were noted.  The z-line was well seen at the GEJ.  The endoscope was pushed into the fundus which was normal including a retroflexed view.  The antrum, gastric body, first and second part of the duodenum were unremarkable. Retroflexed views revealed no abnormalities.     The scope was then withdrawn from the patient and the procedure completed.  COMPLICATIONS: There were no complications. ENDOSCOPIC IMPRESSION: Normal EGD  RECOMMENDATIONS: 1.  Capsule endoscopy 2.  Hemmoccult stools  REPEAT EXAM:  eSigned:  Louis Meckel, MD 07/03/2013 3:18 PM   CC:

## 2013-07-03 NOTE — Progress Notes (Signed)
[facility] PROGRESS NOTE  Sally Walker EUM:[id number] DOB: 02/25/1985 DOA: 07/02/2013 PCP: Rolm Gala, MD  Assessment/Plan: 1-GI bleed, melena, hematochezia; probably Upper GI bleed. Patient was also taking ibuprofen. Hb drop form 14 to 11. Continue with IV fluids. Start IV protonix. GI consulted. Mild increase lipase.   2-Chronic Pain; continue with gabapentin.   3-seizure; Continue with Lamictal.  4-Abdominal pain; PUD vs mild pancreatitis.   Code Status: full code.  Family Communication: care discussed with patient.  Disposition Plan: remain inpatient.    Consultants:  GI  Procedures:  none  Antibiotics:  none  HPI/Subjective: Still complaining of epigastric left upper quadrant pain,   Objective: Filed Vitals:   07/03/13 0504  BP: 110/59  Pulse: 72  Temp: 97.8 F (36.6 C)  Resp: 14    Intake/Output Summary (Last 24 hours) at 07/03/13 0843 Last data filed at 07/03/13 0753  Gross per 24 hour  Intake    430 ml  Output    950 ml  Net   -520 ml   Filed Weights   07/03/13 0140  Weight: 63.776 kg (140 lb 9.6 oz)    Exam:   General:  No distress.   Cardiovascular: S 1, S 2 RRR  Respiratory: CTA  Abdomen: epigastric tenderness, left upper quadrant, no rigidity.   Musculoskeletal: trace edema.   Data Reviewed: Basic Metabolic Panel:  Recent Labs Lab 07/02/13 1944 07/03/13 0417  NA 142 144  K 3.8 3.5*  CL 107 112  CO2 21 20  GLUCOSE 103* 103*  BUN 9 9  CREATININE 0.77 0.71  CALCIUM 8.9 8.1*   Liver Function Tests:  Recent Labs Lab 07/02/13 1944 07/03/13 0417  AST 14 9  ALT 12 9  ALKPHOS 92 78  BILITOT <0.2* <0.2*  PROT 6.8 5.6*  ALBUMIN 3.8 3.0*    Recent Labs Lab 07/02/13 1944  LIPASE 85*   No results found for this basename: AMMONIA,  in the last 168 hours CBC:  Recent Labs Lab 07/02/13 1944 07/03/13 0417  WBC 10.4 9.0  NEUTROABS 6.9  --   HGB 12.5 11.3*  HCT 35.5* 33.3*  MCV 92.9 95.1  PLT  301 251   Cardiac Enzymes: No results found for this basename: CKTOTAL, CKMB, CKMBINDEX, TROPONINI,  in the last 168 hours BNP (last 3 results) No results found for this basename: PROBNP,  in the last 8760 hours CBG: No results found for this basename: GLUCAP,  in the last 168 hours  No results found for this or any previous visit (from the past 240 hour(s)).   Studies: Dg Abd Acute W/chest  07/02/2013   CLINICAL DATA:  Abdominal pain  EXAM: ACUTE ABDOMEN SERIES (ABDOMEN 2 VIEW & CHEST 1 VIEW)  COMPARISON:  06/26/2013  FINDINGS: There is no evidence of dilated bowel loops or free intraperitoneal air. No radiopaque calculi or other significant radiographic abnormality is seen. Heart size and mediastinal contours are within normal limits. Both lungs are clear. Surgical clips in the right upper quadrant with bowel staples in the right abdomen.  IMPRESSION: Negative abdominal radiographs.  No acute cardiopulmonary disease.   Electronically Signed   By: Burman Nieves M.D.   On: 07/02/2013 21:59    Scheduled Meds: . citalopram  40 mg Oral Daily  . fluvoxaMINE  100 mg Oral QHS  . folic acid  1 mg Oral Daily  . gabapentin  300 mg Oral 2 times per day  . gabapentin  600 mg Oral QHS  . lamoTRIgine  200 mg Oral BID  . multivitamin with minerals  1 tablet Oral Daily  . nicotine  21 mg Transdermal Daily  . pantoprazole (PROTONIX) IV  40 mg Intravenous Q12H  . prazosin  4 mg Oral QHS  . risperiDONE  3 mg Oral QHS  . sodium chloride  3 mL Intravenous Q12H  . thiamine  100 mg Oral Daily  . [START ON 07/06/2013] Vitamin D (Ergocalciferol)  50,000 Units Oral Q7 days   Continuous Infusions: . sodium chloride 100 mL/hr at 07/03/13 0242    Principal Problem:   GI bleed Active Problems:   Anemia   Chronic pain    Time spent: 35 minutes.     Hartley Barefoot A  [facility] Pager [phone]. If 7PM-7AM, please contact night-coverage at [email or web address], password [facility] 07/03/2013, 8:43 AM   LOS: 1 day

## 2013-07-03 NOTE — Progress Notes (Signed)
Consent obtain for EGD

## 2013-07-04 ENCOUNTER — Encounter (HOSPITAL_COMMUNITY): Payer: Self-pay | Admitting: Gastroenterology

## 2013-07-04 DIAGNOSIS — R109 Unspecified abdominal pain: Secondary | ICD-10-CM

## 2013-07-04 LAB — CBC
HCT: 36 % (ref 36.0–46.0)
Hemoglobin: 12.1 g/dL (ref 12.0–15.0)
MCH: 31.8 pg (ref 26.0–34.0)
MCHC: 33.6 g/dL (ref 30.0–36.0)
MCV: 94.7 fL (ref 78.0–100.0)
PLATELETS: 282 10*3/uL (ref 150–400)
RBC: 3.8 MIL/uL — ABNORMAL LOW (ref 3.87–5.11)
RDW: 13.6 % (ref 11.5–15.5)
WBC: 8.5 10*3/uL (ref 4.0–10.5)

## 2013-07-04 LAB — BASIC METABOLIC PANEL
BUN: 5 mg/dL — ABNORMAL LOW (ref 6–23)
CHLORIDE: 110 meq/L (ref 96–112)
CO2: 22 mEq/L (ref 19–32)
CREATININE: 0.69 mg/dL (ref 0.50–1.10)
Calcium: 8.6 mg/dL (ref 8.4–10.5)
GFR calc non Af Amer: >90 mL/min (ref >90)
Glucose, Bld: 124 mg/dL — ABNORMAL HIGH (ref 70–99)
Potassium: 3.5 mEq/L — ABNORMAL LOW (ref 3.7–5.3)
Sodium: 143 mEq/L (ref 137–147)

## 2013-07-04 LAB — GLUCOSE, CAPILLARY: Glucose-Capillary: 163 mg/dL — ABNORMAL HIGH (ref 70–99)

## 2013-07-04 MED ORDER — PANTOPRAZOLE SODIUM 40 MG PO TBEC: 40.0000 mg | DELAYED_RELEASE_TABLET | Freq: Two times a day (BID) | ORAL | Status: DC

## 2013-07-04 MED ORDER — HYDROCODONE-ACETAMINOPHEN 5-325 MG PO TABS: 1.0000 | ORAL_TABLET | Freq: Four times a day (QID) | ORAL | Status: AC | PRN

## 2013-07-04 MED ORDER — POTASSIUM CHLORIDE CRYS ER 20 MEQ PO TBCR
40.0000 meq | EXTENDED_RELEASE_TABLET | Freq: Once | ORAL | Status: DC
  Filled 2013-07-04: qty 2

## 2013-07-04 MED ORDER — PANTOPRAZOLE SODIUM 40 MG PO TBEC: 40.0000 mg | DELAYED_RELEASE_TABLET | Freq: Every day | ORAL | Status: AC

## 2013-07-04 NOTE — Discharge Summary (Signed)
Physician Discharge Summary  Sally Walker GDJ:[id number] DOB: 08-04-1985 DOA: 07/02/2013  PCP: Rolm Gala, MD  Admit date: 07/02/2013 Discharge date: 07/04/2013  Time spent: 35 minutes  Recommendations for Outpatient Follow-up:  1. Follow up with Primary gastroenterologist for further care.   Discharge Diagnoses:    GI bleed   Anemia   Chronic Abdominal  pain   Discharge Condition: stable.   Diet recommendation: regular diet.   Filed Weights   07/03/13 0140  Weight: 63.776 kg (140 lb 9.6 oz)    History of present illness:  Sally Walker is a 28 y.o. female with prior history of colectomy and ileostomy with reversal about 2 years ago presents to the ED with pain in her stomach. Patient states pain is located in the epigastric area mainly not sure about relationship to food. Patient states that she has had associated nausea. She has not vomited any frank blood but has had some coffee ground emesis. She has noted diarrhea and also has noted black tarry stools. She states there is associated pain in her chest. Patient has been on Ibuprofen for chronic pain and she states that she has been taking this every 6 hours. Patient has had some back pain also. She states that she does smoke and she has not been drinking. She has not had such severe symptoms in the past. In the ED she was noted to have a new drop in her hemoglobin from 14.5 gm to 12.5 gm and is therefore being admitted for further evaluation. Currently appears to be hemodynamically stable   Hospital Course:  1-GI bleed, melena, hematochezia; probably Upper GI bleed. Patient was also taking ibuprofen. Hb drop form 14 to 11. GI consulted. Mild increase lipase. Hb stable. Endoscopy negative.   2-Chronic Pain; continue with gabapentin.  3-seizure; Continue with Lamictal.  4-Chronic Abdominal pain; Patient is only complaining of her chronic abdominal pain. She is eating regular food. I will refill for her pain  medications #16, she was advised to follow up with pain clinic.    Procedures:  Endoscopy, normal.   Consultations:  Dr Arlyce Dice.   Discharge Exam: Filed Vitals:   07/04/13 0521  BP: 109/63  Pulse: 84  Temp: 97 F (36.1 C)  Resp: 16    General: No distress.  Cardiovascular: S 1, S 2 RRR Respiratory: CTA Abdomen soft, nt, nd  Discharge Instructions You were cared for by a hospitalist during your hospital stay. If you have any questions about your discharge medications or the care you received while you were in the hospital after you are discharged, you can call the unit and asked to speak with the hospitalist on call if the hospitalist that took care of you is not available. Once you are discharged, your primary care physician will handle any further medical issues. Please note that NO REFILLS for any discharge medications will be authorized once you are discharged, as it is imperative that you return to your primary care physician (or establish a relationship with a primary care physician if you do not have one) for your aftercare needs so that they can reassess your need for medications and monitor your lab values.  Discharge Instructions   Diet general    Complete by:  As directed      Increase activity slowly    Complete by:  As directed             Medication List    STOP taking these medications  ibuprofen 200 MG tablet  Commonly known as:  ADVIL,MOTRIN      TAKE these medications       citalopram 40 MG tablet  Commonly known as:  CELEXA  Take 40 mg by mouth daily.     FLUoxetine 20 MG tablet  Commonly known as:  PROZAC  Take 20 mg by mouth daily.     fluvoxaMINE 100 MG tablet  Commonly known as:  LUVOX  Take 100 mg by mouth at bedtime.     gabapentin 300 MG capsule  Commonly known as:  NEURONTIN  Take 300-600 mg by mouth See admin instructions. 1am, 1 at lunch, Take 2 capsules at bedtime     HYDROcodone-acetaminophen 5-325 MG per tablet   Commonly known as:  NORCO/VICODIN  Take 1 tablet by mouth every 6 (six) hours as needed for moderate pain.     LamoTRIgine XR 200 MG Tb24  Take 400 mg by mouth at bedtime.     pantoprazole 40 MG tablet  Commonly known as:  PROTONIX  Take 1 tablet (40 mg total) by mouth daily.     prazosin 2 MG capsule  Commonly known as:  MINIPRESS  Take 4 mg by mouth at bedtime.     risperiDONE 3 MG tablet  Commonly known as:  RISPERDAL  Take 3 mg by mouth at bedtime.     traZODone 50 MG tablet  Commonly known as:  DESYREL  Take 100 mg by mouth at bedtime as needed for sleep.     Vitamin D (Ergocalciferol) 50000 UNITS Caps capsule  Commonly known as:  DRISDOL  Take 50,000 Units by mouth every 7 (seven) days.       Allergies  Allergen Reactions  . Adhesive [Tape] Itching  . Fentanyl Other (See Comments)    REACTION:  unknown  . Midodrine Other (See Comments)    REACTION:  unknown  . Morphine And Related Other (See Comments)    REACTION:  unknown  . Reglan [Metoclopramide] Itching and Nausea And Vomiting  . Toradol [Ketorolac Tromethamine] Itching  . Vancomycin Hives  . Zofran [Ondansetron Hcl] Itching  . Latex Itching and Rash      The results of significant diagnostics from this hospitalization (including imaging, microbiology, ancillary and laboratory) are listed below for reference.    Significant Diagnostic Studies: Dg Chest 2 View  06/25/2013   CLINICAL DATA:  Cough.  Chest pain.  EXAM: CHEST  2 VIEW  COMPARISON:  06/01/2013  FINDINGS: The heart size and mediastinal contours are within normal limits. Both lungs are clear. The visualized skeletal structures are unremarkable.  IMPRESSION: No active cardiopulmonary disease.   Electronically Signed   By: Amie Portland M.D.   On: 06/25/2013 21:05   Dg Abd 2 Views  06/26/2013   CLINICAL DATA:  Left-sided abdominal pain.  EXAM: ABDOMEN - 2 VIEW  COMPARISON:  Abdominal radiographs, 06/01/2013. Abdominal CT, 05/17/2013.  FINDINGS:  Mildly dilated small bowel is noted in the central abdomen. There is associated anastomosis staple line in the right central abdomen.  No air-fluid levels on the erect view. There is no free air. Stable changes from cholecystectomy. Soft tissues are otherwise unremarkable. No bony abnormality.  IMPRESSION: Mildly prominent small bowel in the central abdomen where there is a bowel anastomosis staple line. No air-fluid levels or free air. No evidence of obstruction. Findings are nonspecific.   Electronically Signed   By: Amie Portland M.D.   On: 06/26/2013 00:29   Dg Abd Acute  W/chest  07/02/2013   CLINICAL DATA:  Abdominal pain  EXAM: ACUTE ABDOMEN SERIES (ABDOMEN 2 VIEW & CHEST 1 VIEW)  COMPARISON:  06/26/2013  FINDINGS: There is no evidence of dilated bowel loops or free intraperitoneal air. No radiopaque calculi or other significant radiographic abnormality is seen. Heart size and mediastinal contours are within normal limits. Both lungs are clear. Surgical clips in the right upper quadrant with bowel staples in the right abdomen.  IMPRESSION: Negative abdominal radiographs.  No acute cardiopulmonary disease.   Electronically Signed   By: Burman Nieves M.D.   On: 07/02/2013 21:59    Microbiology: No results found for this or any previous visit (from the past 240 hour(s)).   Labs: Basic Metabolic Panel:  Recent Labs Lab 07/02/13 1944 07/03/13 0417 07/04/13 0410  NA 142 144 143  K 3.8 3.5* 3.5*  CL 107 112 110  CO2 21 20 22   GLUCOSE 103* 103* 124*  BUN 9 9 5*  CREATININE 0.77 0.71 0.69  CALCIUM 8.9 8.1* 8.6   Liver Function Tests:  Recent Labs Lab 07/02/13 1944 07/03/13 0417  AST 14 9  ALT 12 9  ALKPHOS 92 78  BILITOT <0.2* <0.2*  PROT 6.8 5.6*  ALBUMIN 3.8 3.0*    Recent Labs Lab 07/02/13 1944  LIPASE 85*   No results found for this basename: AMMONIA,  in the last 168 hours CBC:  Recent Labs Lab 07/02/13 1944 07/03/13 0417 07/04/13 0410  WBC 10.4 9.0 8.5   NEUTROABS 6.9  --   --   HGB 12.5 11.3* 12.1  HCT 35.5* 33.3* 36.0  MCV 92.9 95.1 94.7  PLT 301 251 282   Cardiac Enzymes: No results found for this basename: CKTOTAL, CKMB, CKMBINDEX, TROPONINI,  in the last 168 hours BNP: BNP (last 3 results) No results found for this basename: PROBNP,  in the last 8760 hours CBG:  Recent Labs Lab 07/03/13 0749 07/04/13 0747  GLUCAP 94 163*       Signed:  Woody Kronberg A  [facility] 07/04/2013, 1:41 PM

## 2013-07-04 NOTE — Progress Notes (Signed)
All d/c forms completed and signed by pt. D/C IV intact. RX given to patient. Pt verbalizes understanding of d/c instruction

## 2013-07-04 NOTE — Progress Notes (Addendum)
    Progress Note   Subjective  No further bleeding but has abdominal pain.    Objective   Vital signs in last 24 hours: Temp:  [97 F (36.1 C)-98.3 F (36.8 C)] 97 F (36.1 C) (06/12 0521) Pulse Rate:  [54-84] 84 (06/12 0521) Resp:  [11-18] 16 (06/12 0521) BP: (109-152)/(63-101) 109/63 mmHg (06/12 0521) SpO2:  [96 %-100 %] 97 % (06/12 0521) Last BM Date: 07/03/13 General:    white female in NAD. Friend visiting Heart:  Regular rate and rhythm Lungs: Respirations even and unlabored, lungs CTA bilaterally Abdomen:  Soft, nontender and nondistended. Normal bowel sounds. Extremities:  Without edema. Neurologic:  Alert and oriented,  grossly normal neurologically. Psych:  Cooperative. Normal mood and affect.  Lab Results:  Recent Labs  07/02/13 1944 07/03/13 0417 07/04/13 0410  WBC 10.4 9.0 8.5  HGB 12.5 11.3* 12.1  HCT 35.5* 33.3* 36.0  PLT 301 251 282   BMET  Recent Labs  07/02/13 1944 07/03/13 0417 07/04/13 0410  NA 142 144 143  K 3.8 3.5* 3.5*  CL 107 112 110  CO2 21 20 22   GLUCOSE 103* 103* 124*  BUN 9 9 5*  CREATININE 0.77 0.71 0.69  CALCIUM 8.9 8.1* 8.6   LFT  Recent Labs  07/03/13 0417  PROT 5.6*  ALBUMIN 3.0*  AST 9  ALT 9  ALKPHOS 78  BILITOT <0.2*     Assessment / Plan:   86. 28 year old female admitted with reports of melena and hematemesis. BUN normal. No stool / blood on DRE. EGD negative. No further bleeding. No plans for further endoscopic workup. Stable from discharge from GI standpoint   2. Mild normocytic anemia. Hgb 12.4 in ED, down from baseline of around 14. Hgb has remained stable since admission.   3. Extensive psychiatric History including, but not limited to depression, drug overdose, substance abuse. On multiple psych medications. Upset that IV narcotics being stopped  4. Complex surgical history including colectomy and ileostomy with reversal x2 in 35 a few years ago.    LOS: 2 days   [place]  07/04/2013,  9:30 AM  GI Attending Note  I have personally taken an interval history, reviewed the chart, and examined the patient.  I agree with the extender's note, impression and recommendations. Although she complains of pain she looks perfectly comfortable.  May consider a trial of anticholinergics.  No further GI w/u.  09/03/2013. Barbette Hair, MD, [facility] [phone]

## 2013-07-06 NOTE — ED Provider Notes (Signed)
Medical screening examination/treatment/procedure(s) were performed by non-physician practitioner and as supervising physician I was immediately available for consultation/collaboration.   EKG Interpretation None        Audree Camel, MD 07/06/13 [phone]

## 2013-07-08 ENCOUNTER — Telehealth: Payer: Self-pay

## 2013-07-08 DIAGNOSIS — D649 Anemia, unspecified: Secondary | ICD-10-CM

## 2013-07-08 NOTE — Telephone Encounter (Signed)
Message copied by Chrystie Nose on Tue Jul 08, 2013  2:26 PM ------      Message from: Melvia Heaps D      Created: Tue Jul 08, 2013 11:20 AM      Regarding: RE: Capsule endo       yes      ----- Message -----         From: Lily Lovings, RN         Sent: 07/08/2013  10:44 AM           To: Louis Meckel, MD      Subject: Capsule endo                                             Dr. Arlyce Dice,            Do you want to get stool cards done on this pt before we schedule her for capsule endo?            Thanks,      Bonita Quin        ------

## 2013-07-08 NOTE — Telephone Encounter (Signed)
Stool cards mailed to pt. 

## 2013-07-09 ENCOUNTER — Emergency Department (HOSPITAL_COMMUNITY): Payer: Medicare Other

## 2013-07-09 ENCOUNTER — Encounter (HOSPITAL_COMMUNITY): Payer: Self-pay | Admitting: Emergency Medicine

## 2013-07-09 ENCOUNTER — Emergency Department (HOSPITAL_COMMUNITY)
Admission: EM | Admit: 2013-07-09 | Discharge: 2013-07-10 | Disposition: A | Discharge status: Home / Self Care | Payer: Medicare Other | Attending: Emergency Medicine | Admitting: Emergency Medicine

## 2013-07-09 DIAGNOSIS — F17200 Nicotine dependence, unspecified, uncomplicated: Secondary | ICD-10-CM | POA: Insufficient documentation

## 2013-07-09 DIAGNOSIS — Z91040 Latex allergy status: Secondary | ICD-10-CM | POA: Insufficient documentation

## 2013-07-09 DIAGNOSIS — Z8719 Personal history of other diseases of the digestive system: Secondary | ICD-10-CM | POA: Insufficient documentation

## 2013-07-09 DIAGNOSIS — Z87442 Personal history of urinary calculi: Secondary | ICD-10-CM | POA: Insufficient documentation

## 2013-07-09 DIAGNOSIS — F329 Major depressive disorder, single episode, unspecified: Secondary | ICD-10-CM | POA: Diagnosis not present

## 2013-07-09 DIAGNOSIS — Z79899 Other long term (current) drug therapy: Secondary | ICD-10-CM | POA: Insufficient documentation

## 2013-07-09 DIAGNOSIS — G40909 Epilepsy, unspecified, not intractable, without status epilepticus: Secondary | ICD-10-CM | POA: Diagnosis not present

## 2013-07-09 DIAGNOSIS — F3289 Other specified depressive episodes: Secondary | ICD-10-CM | POA: Insufficient documentation

## 2013-07-09 DIAGNOSIS — Z8739 Personal history of other diseases of the musculoskeletal system and connective tissue: Secondary | ICD-10-CM | POA: Diagnosis not present

## 2013-07-09 DIAGNOSIS — R109 Unspecified abdominal pain: Secondary | ICD-10-CM | POA: Diagnosis present

## 2013-07-09 DIAGNOSIS — Z3202 Encounter for pregnancy test, result negative: Secondary | ICD-10-CM | POA: Insufficient documentation

## 2013-07-09 LAB — COMPREHENSIVE METABOLIC PANEL
ALBUMIN: 3.6 g/dL (ref 3.5–5.2)
ALK PHOS: 102 U/L (ref 39–117)
ALT: 8 U/L (ref 0–35)
AST: 11 U/L (ref 0–37)
BUN: 9 mg/dL (ref 6–23)
CO2: 20 mEq/L (ref 19–32)
Calcium: 9 mg/dL (ref 8.4–10.5)
Chloride: 105 mEq/L (ref 96–112)
Creatinine, Ser: 0.67 mg/dL (ref 0.50–1.10)
GFR calc Af Amer: >90 mL/min (ref >90)
GFR calc non Af Amer: >90 mL/min (ref >90)
Glucose, Bld: 105 mg/dL — ABNORMAL HIGH (ref 70–99)
POTASSIUM: 3.7 meq/L (ref 3.7–5.3)
Sodium: 140 mEq/L (ref 137–147)
Total Bilirubin: <0.2 mg/dL — ABNORMAL LOW (ref 0.3–1.2)
Total Protein: 6.7 g/dL (ref 6.0–8.3)

## 2013-07-09 LAB — URINE MICROSCOPIC-ADD ON

## 2013-07-09 LAB — URINALYSIS, ROUTINE W REFLEX MICROSCOPIC
Bilirubin Urine: NEGATIVE
GLUCOSE, UA: NEGATIVE mg/dL
Hgb urine dipstick: NEGATIVE
KETONES UR: NEGATIVE mg/dL
Nitrite: NEGATIVE
PROTEIN: NEGATIVE mg/dL
Specific Gravity, Urine: 1.021 (ref 1.005–1.030)
UROBILINOGEN UA: 0.2 mg/dL (ref 0.0–1.0)
pH: 6 (ref 5.0–8.0)

## 2013-07-09 LAB — LIPASE, BLOOD: Lipase: 70 U/L — ABNORMAL HIGH (ref 11–59)

## 2013-07-09 MED ORDER — OXYCODONE-ACETAMINOPHEN 5-325 MG PO TABS
1.0000 | ORAL_TABLET | Freq: Once | ORAL | Status: AC
  Administered 2013-07-09: 1 via ORAL
  Filled 2013-07-09: qty 1

## 2013-07-09 MED ORDER — LORAZEPAM 1 MG PO TABS
1.0000 mg | ORAL_TABLET | Freq: Once | ORAL | Status: AC
  Administered 2013-07-09: 1 mg via ORAL
  Filled 2013-07-09: qty 1

## 2013-07-09 MED ORDER — HYOSCYAMINE SULFATE 0.125 MG PO TABS
0.2500 mg | ORAL_TABLET | Freq: Once | ORAL | Status: AC
  Administered 2013-07-09: 0.25 mg via ORAL
  Filled 2013-07-09: qty 2

## 2013-07-09 NOTE — ED Notes (Signed)
Per EMS pt having RUQ abdominal pain that started yesterday, has had dark/tarry stool x 3 today. Has had part of colon removed d/t diverticulitis. Pt states was admitted x 4 days last week for vomiting blood.

## 2013-07-09 NOTE — ED Notes (Signed)
Bed: IO96 Expected date:  Expected time:  Means of arrival:  Comments: EMS/28 yo female RUQ pain with dark tarry stools-recent admission for same

## 2013-07-10 ENCOUNTER — Emergency Department: Payer: Self-pay

## 2013-07-10 LAB — BASIC METABOLIC PANEL
ANION GAP: 7 (ref 7–16)
BUN: 8 mg/dL (ref 7–18)
Calcium, Total: 8.5 mg/dL (ref 8.5–10.1)
Chloride: 109 mmol/L — ABNORMAL HIGH (ref 98–107)
Co2: 25 mmol/L (ref 21–32)
Creatinine: 0.63 mg/dL (ref 0.60–1.30)
EGFR (African American): >60
EGFR (Non-African Amer.): >60
Glucose: 94 mg/dL (ref 65–99)
OSMOLALITY: 279 (ref 275–301)
Potassium: 3.5 mmol/L (ref 3.5–5.1)
Sodium: 141 mmol/L (ref 136–145)

## 2013-07-10 LAB — CBC WITH DIFFERENTIAL/PLATELET
Basophil #: 0.1 10*3/uL (ref 0.0–0.1)
Basophil %: 0.8 %
Basophils Absolute: 0 10*3/uL (ref 0.0–0.1)
Basophils Relative: 0 % (ref 0–1)
EOS PCT: 0.9 %
Eosinophil #: 0.1 10*3/uL (ref 0.0–0.7)
Eosinophils Absolute: 0.1 10*3/uL (ref 0.0–0.7)
Eosinophils Relative: 1 % (ref 0–5)
HCT: 37.6 % (ref 36.0–46.0)
HCT: 40.2 % (ref 35.0–47.0)
HGB: 13.5 g/dL (ref 12.0–16.0)
Hemoglobin: 13.1 g/dL (ref 12.0–15.0)
LYMPHS PCT: 26 % (ref 12–46)
LYMPHS PCT: 20.5 %
Lymphocyte #: 2.1 10*3/uL (ref 1.0–3.6)
Lymphs Abs: 2.7 10*3/uL (ref 0.7–4.0)
MCH: 32.8 pg (ref 26.0–34.0)
MCH: 32.5 pg (ref 26.0–34.0)
MCHC: 34.8 g/dL (ref 30.0–36.0)
MCHC: 33.5 g/dL (ref 32.0–36.0)
MCV: 94.2 fL (ref 78.0–100.0)
MCV: 97 fL (ref 80–100)
Monocyte #: 1 x10 3/mm — ABNORMAL HIGH (ref 0.2–0.9)
Monocyte %: 10.2 %
Monocytes Absolute: 1.2 10*3/uL — ABNORMAL HIGH (ref 0.1–1.0)
Monocytes Relative: 11 % (ref 3–12)
NEUTROS ABS: 6.8 10*3/uL — AB (ref 1.4–6.5)
Neutro Abs: 6.5 10*3/uL (ref 1.7–7.7)
Neutrophil %: 67.6 %
Neutrophils Relative %: 62 % (ref 43–77)
PLATELETS: 331 10*3/uL (ref 150–440)
Platelets: 329 10*3/uL (ref 150–400)
RBC: 3.99 MIL/uL (ref 3.87–5.11)
RBC: 4.15 10*6/uL (ref 3.80–5.20)
RDW: 14 % (ref 11.5–15.5)
RDW: 14.5 % (ref 11.5–14.5)
WBC Morphology: INCREASED
WBC: 10.5 10*3/uL (ref 4.0–10.5)
WBC: 10 10*3/uL (ref 3.6–11.0)

## 2013-07-10 LAB — D-DIMER(ARMC): D-Dimer: 287 ng/ml

## 2013-07-10 LAB — PREGNANCY, URINE: Preg Test, Ur: NEGATIVE

## 2013-07-10 LAB — TROPONIN I: Troponin-I: <0.02 ng/mL

## 2013-07-10 MED ORDER — HYOSCYAMINE SULFATE 0.125 MG PO TABS: 0.2500 mg | ORAL_TABLET | Freq: Once | ORAL | Status: AC

## 2013-07-10 MED ORDER — PROMETHAZINE HCL 25 MG RE SUPP: 25.0000 mg | Freq: Four times a day (QID) | RECTAL | Status: AC | PRN

## 2013-07-10 NOTE — ED Provider Notes (Signed)
CSN: [id number]     Arrival date & time 07/09/13  2132 History   First MD Initiated Contact with Patient 07/09/13 2306     Chief Complaint  Patient presents with  . Abdominal Pain     HPI Patient reports crampy abdominal pain over the past several days.  She was recently admitted to the hospital with a complaint of GI bleeding.  She had an endoscopy which demonstrated no active bleeding.  She was seen by gastroenterology at that time.  She presents now because of ongoing abdominal discomfort.  She states this is been a recurrent issue over the past several years.  She denies fevers and chills.  No nausea or vomiting.  No diarrhea.  Past Medical History  Diagnosis Date  . SBO (small bowel obstruction) 2011    "scar tissue rupture on my intestines"   . Diverticulitis   . Depression     Per pt, no history of suicidal ideation  . Seizure disorder     Last seizure in 2012  . Lumbar herniated disc     per patient  . 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 Drug dependence   . Polysubstance abuse   . Rheumatoid arthritis(714.0)   . Osteoporosis   . Kidney stones   . Fibromyalgia    Past Surgical History  Procedure Laterality Date  . Total colectomy      Done in [place]  . Cholecystectomy      per patient  . Other surgical history      kidney stone removal   . Esophagogastroduodenoscopy N/A 07/03/2013    Procedure: ESOPHAGOGASTRODUODENOSCOPY (EGD);  Surgeon: Louis Meckel, MD;  Location: [facility];  Service: Endoscopy;  Laterality: N/A;   Family History  Problem Relation Age of Onset  . Leukemia Father   . Heart disease      paternal GM, GF  . Fibromyalgia Mother   . Heart disease      maternal GM   History  Substance Use Topics  . Smoking status: Current Every Day Smoker -- 1.00 packs/day for 14 years    Types: Cigarettes  . Smokeless tobacco: Not on file  . Alcohol Use: No   OB History   Grav Para Term Preterm Abortions TAB SAB Ect Mult  Living                 Review of Systems  All other systems reviewed and are negative.     Allergies  Adhesive; Fentanyl; Midodrine; Morphine and related; Reglan; Toradol; Vancomycin; Zofran; and Latex  Home Medications   Prior to Admission medications   Medication Sig Start Date End Date Taking? Authorizing Carlise Stofer  citalopram (CELEXA) 40 MG tablet Take 40 mg by mouth daily.    Yes Historical Arend Bahl, MD  FLUoxetine (PROZAC) 20 MG tablet Take 20 mg by mouth daily.   Yes Historical Renzo Vincelette, MD  fluvoxaMINE (LUVOX) 100 MG tablet Take 100 mg by mouth at bedtime.  03/15/13  Yes Historical Sophia Sperry, MD  gabapentin (NEURONTIN) 300 MG capsule Take 300-600 mg by mouth See admin instructions. 1am, 1 at lunch, Take 2 capsules at bedtime   Yes Historical Binnie Droessler, MD  LamoTRIgine 200 MG TB24 Take 400 mg by mouth at bedtime.   Yes Historical Cassadi Purdie, MD  pantoprazole (PROTONIX) 40 MG tablet Take 1 tablet (40 mg total) by mouth daily. 07/04/13  Yes Belkys A Regalado, MD  prazosin (MINIPRESS) 2 MG capsule Take 4 mg by  mouth at bedtime.    Yes Historical Addy Mcmannis, MD  risperiDONE (RISPERDAL) 3 MG tablet Take 3 mg by mouth at bedtime.  11/11/12  Yes Historical Telissa Palmisano, MD  traZODone (DESYREL) 50 MG tablet Take 100 mg by mouth at bedtime as needed for sleep.    Yes Historical Analina Filla, MD  Vitamin D, Ergocalciferol, (DRISDOL) 50000 UNITS CAPS capsule Take 50,000 Units by mouth every 7 (seven) days.  06/15/13 07/15/13 Yes Historical Ardie Dragoo, MD  HYDROcodone-acetaminophen (NORCO/VICODIN) 5-325 MG per tablet Take 1 tablet by mouth every 6 (six) hours as needed for moderate pain. 07/04/13   Belkys A Regalado, MD   BP 112/66  Pulse 71  Temp(Src) 97.9 F (36.6 C) (Oral)  Resp 18  SpO2 97%  LMP 06/18/2013 Physical Exam  Nursing note and vitals reviewed. Constitutional: She is oriented to person, place, and time. She appears well-developed and well-nourished. No distress.  HENT:  Head: Normocephalic  and atraumatic.  Eyes: EOM are normal.  Neck: Normal range of motion.  Cardiovascular: Normal rate, regular rhythm and normal heart sounds.   Pulmonary/Chest: Effort normal and breath sounds normal.  Abdominal: Soft. She exhibits no distension. There is no tenderness.  Musculoskeletal: Normal range of motion.  Neurological: She is alert and oriented to person, place, and time.  Skin: Skin is warm and dry.  Psychiatric: She has a normal mood and affect. Judgment normal.    ED Course  Procedures (including critical care time) Labs Review Labs Reviewed  CBC WITH DIFFERENTIAL - Abnormal; Notable for the following:    Monocytes Absolute 1.2 (*)    All other components within normal limits  COMPREHENSIVE METABOLIC PANEL - Abnormal; Notable for the following:    Glucose, Bld 105 (*)    Total Bilirubin <0.2 (*)    All other components within normal limits  LIPASE, BLOOD - Abnormal; Notable for the following:    Lipase 70 (*)    All other components within normal limits  URINALYSIS, ROUTINE W REFLEX MICROSCOPIC - Abnormal; Notable for the following:    APPearance CLOUDY (*)    Leukocytes, UA TRACE (*)    All other components within normal limits  URINE MICROSCOPIC-ADD ON - Abnormal; Notable for the following:    Crystals CA OXALATE CRYSTALS (*)    All other components within normal limits  PREGNANCY, URINE    Imaging Review Dg Abd 2 Views  07/10/2013   CLINICAL DATA:  Right lower quadrant abdominal pain. Shortness of breath and chest pain.  EXAM: ABDOMEN - 2 VIEW  COMPARISON:  Chest and abdominal radiographs performed 07/02/2013  FINDINGS: The visualized bowel gas pattern is unremarkable. Scattered air and stool filled loops of colon are seen; no abnormal dilatation of small bowel loops is seen to suggest small bowel obstruction. No free intra-abdominal air is identified on the provided upright view. Clips are noted within the right upper quadrant, reflecting prior cholecystectomy.  Postoperative change is seen at the right lower quadrant.  The visualized osseous structures are within normal limits; the sacroiliac joints are unremarkable in appearance. The visualized lung bases are essentially clear.  IMPRESSION: Unremarkable bowel gas pattern; no free intra-abdominal air seen.   Electronically Signed   By: Roanna Raider M.D.   On: 07/10/2013 00:48     EKG Interpretation None      MDM   Final diagnoses:  None   Patient feels better after symptomatic treatment in emergency department.  Plain films demonstrate nonobstructive pattern.  Labs without significant abnormality.  Hemoglobin baseline.  Discharge home with outpatient GI followup.  Medications  LORazepam (ATIVAN) tablet 1 mg (1 mg Oral Given 07/09/13 2326)  oxyCODONE-acetaminophen (PERCOCET/ROXICET) 5-325 MG per tablet 1 tablet (1 tablet Oral Given 07/09/13 2326)  hyoscyamine (LEVSIN, ANASPAZ) tablet 0.25 mg (0.25 mg Oral Given 07/09/13 2336)     Lyanne Co, MD 07/10/13 [phone]

## 2013-07-10 NOTE — Discharge Instructions (Signed)

## 2013-08-07 ENCOUNTER — Emergency Department: Payer: Self-pay | Admitting: Emergency Medicine

## 2014-02-11 IMAGING — CR DG ABDOMEN 2V
1 series · 3 of 3 positions shown · non-contrast
Comparison: none

REASON FOR EXAM: f/u ileus
COMMENTS:

[Series 5: w abdomen upright · 0.14mm/px · 3 of 3 slices shown]
[im 1/3]
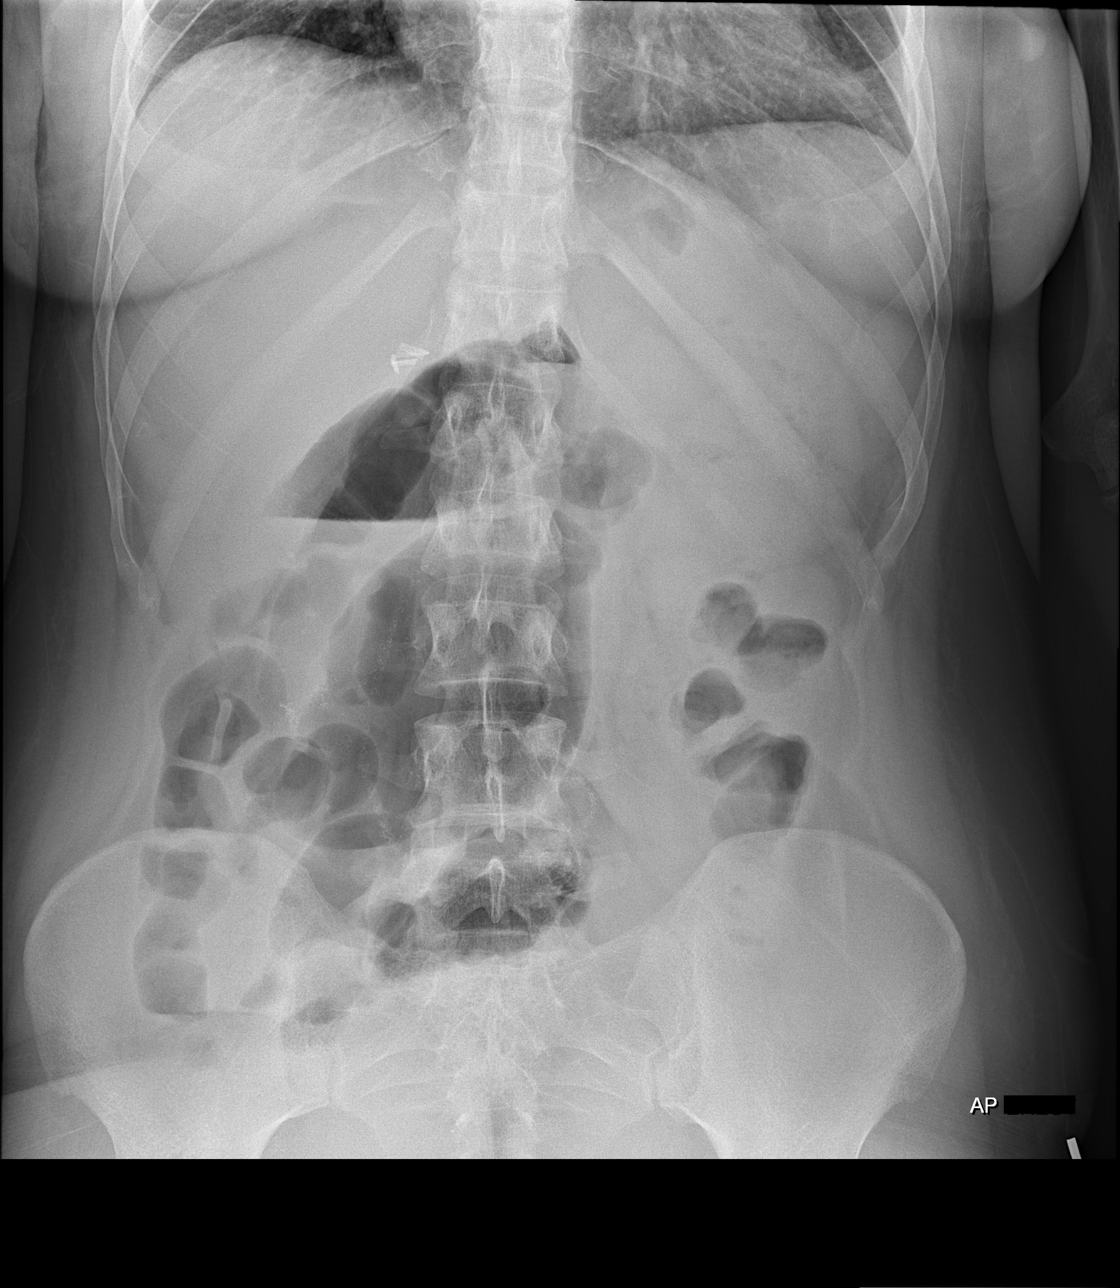
[im 2/3]
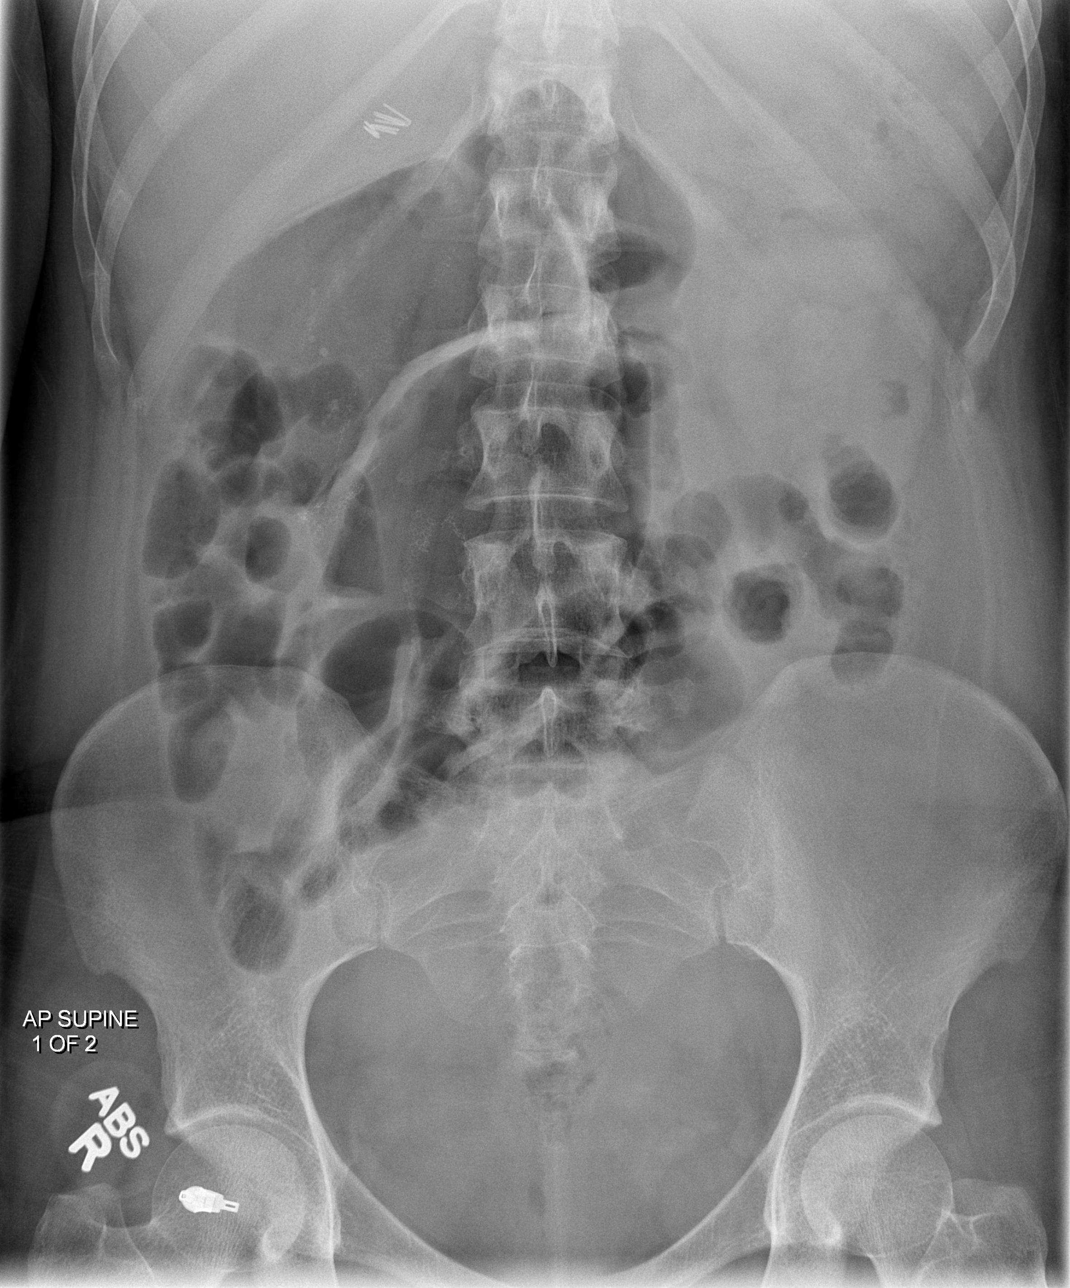
[im 3/3]
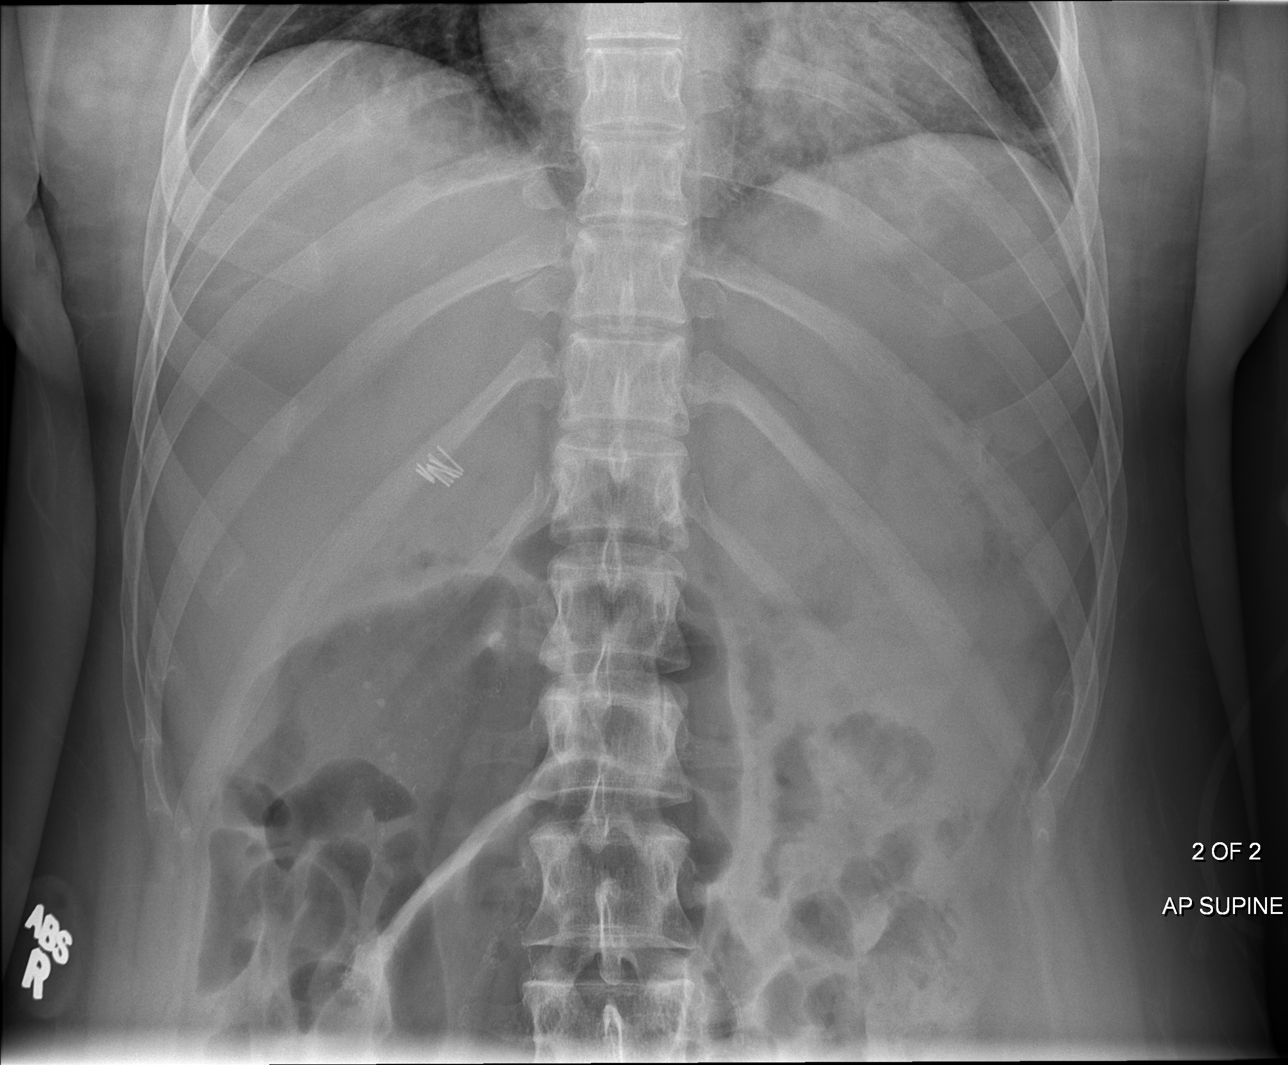

[3 of 3 positions shown; findings below may reference images not displayed]

PROCEDURE:     DXR - DXR ABDOMEN 2 V FLAT AND ERECT  - April 24, 2012 [DATE]

RESULT:     Comparison is made to the study April 23, 2012.

There is a dominant air-fluid level in the midabdomen just to the right of
midline. There are loops of moderately distended bowel inferior to this.
When compared to yesterday's study there has not been significant interval
change. No free extraluminal gas collections are demonstrated. Only a tiny
amount of gas is noted in the rectum.
IMPRESSION: The bowel gas pattern is not greatly changed today as
compared to the earlier stone study though slightly more gaseous distention
of bowel in the mid and right upper abdomen is present

It may be useful to consider the patient for followup abdominal and pelvic
CT scanning.

[REDACTED]

## 2014-03-25 DIAGNOSIS — IMO0002 Reserved for concepts with insufficient information to code with codable children: Secondary | ICD-10-CM | POA: Insufficient documentation

## 2014-05-15 NOTE — Op Note (Signed)
PATIENT NAME:  Sally Walker, Sally Walker MR#:  [id number] DATE OF BIRTH:  [birth year]  DATE OF PROCEDURE:  09/10/2012  PREOPERATIVE DIAGNOSIS: Bilateral ovarian cysts indicative of endometriomas.   POSTOPERATIVE DIAGNOSES: Bilateral ovarian cysts indicative of endometriomas, severe abdominal adhesions.   SURGEON: Elliot Gurney, M.D.   ASSISTANT: Florina Ou. Bonney Aid, M.D.    PROCEDURE: Open Huss[place] laparoscopy attempted but adhesions found to be too great, converted to exploratory laparotomy with bilateral cystectomy, right removal of endometrioma and right and left drainage of ovarian cysts.   FINDINGS: The patient was found to have bilateral ovarian cysts with an endometrioma on the right side. Fluid-filled cysts bilaterally, the largest being approximately 10 cm in size which was incised and drained, as well as very severe anterior abdominal wall adhesions from previous surgeries.   DESCRIPTION OF PROCEDURE: The patient was taken to the operating room and placed in the supine position. After adequate general endotracheal anesthesia was instilled, the patient was prepped and draped in the usual sterile fashion. The umbilicus was injected with Marcaine, and a side-opening speculum was placed in the patient's vagina. The anterior lip of the cervix was grasped with a single-tooth tenaculum. The Hulka tenaculum was placed. Attention was then placed back to the umbilicus. The umbilicus was then incised with a knife at the edge of the old incision, carried sharply down to fascia. The fascia was grasped with Allis clamps. The fascia was then sharply entered with Metzenbaum scissors. This was carried sharply past the muscles. The peritoneum was identified and cell layer by cell layer was opened. Entry into the abdomen was identified. Bowels were identified; however, with finger sweep in the area, it was found that the adhesions were quite great. CO2 was attempted to be pumped slowly on low flow into the area to  possibly separate the bowels from the fascia; however, this did not help. The trocar was removed. The fascia was closed with a running Vicryl suture, and 4-0 Monocryl interrupted suture was used to approximate the skin edges. Dermabond was placed. Attention was then turned down to the midline where an incision was made through the old incision from the pubic symphysis to approximately 4 inches up from there. This was carried sharply down to the fascia. Lots of scar tissue was identified. The scar tissue was excised and released with the Bovie. The fascia was nicked. This was extended in superior and inferior manner. The fascia was grasped with Allis clamps bilaterally, and the muscle bellies were sharply and bluntly dissected off the fascia. The peritoneum was grasped and entered sharply, but it was found that the bowels were not stuck to this area. The patient was placed in Trendelenburg, and moist laparotomy sponges were used to pack back the bowel with a curved Deaver. Manual exam (Dictation Anomaly) was performed. The ovaries were identified and found to be stuck in the cul-de-sac and the fossa bilaterally. The left ovary was released somewhat. The tube was found to be very swollen and hard, as was the right tube. The right ovary was identified and pulled forward. The initial fluid-filled cyst was drained. The endometrioma was then identified, (Dictation Anomaly) and, incised open with the Bovie and the cyst wall was removed with the Allis clamp. Good hemostasis was identified. The ovary was placed back into the pelvis. The left ovary was then addressed. The endometrioma was drained, and the cyst wall was removed. The other fluid-filled cyst was opened and drained. The belly was irrigated with copious amounts of warm  normal saline. Good hemostasis was identified. The laps were removed from the belly. Counts were found to be correct. The muscle bellies were approximated with a running Vicryl suture. The fascia  was closed with a running Maxon suture. Again, the subcutaneous fat scarring was released with the Bovie, and the skin clips were placed along the skin edges. Then, the side-opening speculum was removed from the patient's vagina, and the Hulka was removed from the patient's vagina. The patient was laid supine and taken to recovery after having tolerated the procedure well.   ____________________________ Elliot Gurney, MD cck:gb D: 09/12/2012 22:42:19 ET T: 09/13/2012 00:29:37 ET JOB#: [id number]  cc: Elliot Gurney, MD, <Dictator> Elliot Gurney MD ELECTRONICALLY SIGNED 09/19/2012 18:50

## 2014-05-15 NOTE — Consult Note (Signed)
PATIENT NAME:  Sally Walker, STEINHAUSER MR#:  [id number] DATE OF BIRTH:  1985/05/20  DATE OF CONSULTATION:  09/28/2012  CONSULTING PHYSICIAN:  Loraine Leriche A. Egbert Garibaldi, MD  HOSPITAL: A 29 year old white female with a complex past surgical history, including a left colectomy with diverting ileostomy, ileostomy reversal as well as appendectomy, cholecystectomy. 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 The patient is recently status post laparotomy with lysis of adhesions and removal of right endometrioma and drainage of left ovarian cyst on the 19th of August 2014 by Dr Adron Bene of GYN service. Postoperatively, she did well. She has had chronic abdominal pain for months now which predated her recent surgery.  The patient was admitted to the OB/GYN service following abdominal pain and questionable diagnosis of a left tubo-ovarian abscess. The patient is being treated for such. CT scan was obtained which was read as a possible fistula between adnexal structure as well as enteroenteric fistula within the pelvis. The patient has had a previous workup for Crohn's, of which I do not know the results. She did not follow up with GI medicine. The patient states she has pain all the time. Upon my arrival, the patient was able to jump out of bed, sit up and talk to me. She did not appear to be in any distress. Surgical services were asked to address the findings on the CT scan.   ALLERGIES: MULTIPLE MEDICATIONS, MULTIPLE IN THE CHART.   PAST MEDICAL HISTORY: As described above.   PAST SURGICAL HISTORY: As described above.   PHYSICAL EXAMINATION:  VITAL SIGNS: The patient's temperature is 98.4, pulse is 78, blood pressure is 105/67, room air saturation is 97% on room air.  ABDOMEN: Soft. There is a complex scar in the midline. There is evidence of  previous infection. There are 2 staples intact on her lower abdomen. Previous laparotomy from last month appears to be well healed. There is no significant wound drainage. No cellulitis is present. No obvious hernia. There is mild tenderness to palpation within the lower abdomen.   LABORATORY VALUES: White count 7.3, hemoglobin 11.6, platelet count 395,000. Liver function tests are normal. Lipase is 114. Electrolytes are unremarkable. Review of CT scan, personally interpreted. There appear to be some postoperative changes within the pelvis.   IMPRESSION: Abdominal pain. I do not think this represents fistula disease since the patient recently had a laparotomy, and there were no findings at that time. All of the findings on CT scan may be secondary to postoperative changes, and in this setting, no surgical intervention is required.    RECOMMENDATIONS: I do recommend treatment for whatever presumed ovarian abscess the patient may have. At this point in time, the patient has no surgical intervention that is required. I will have her follow up with GI medicine, specifically Dr. Marva Panda, who she has seen in the past. If any further surgical intervention or Crohn's disease is found, the patient needs to be followed up in a tertiary care medical center.   ____________________________ Redge Gainer. Egbert Garibaldi, MD mab:OSi D: 09/28/2012 12:23:14 ET T: 09/28/2012 12:56:50 ET JOB#: [id number]  cc: Loraine Leriche A. Egbert Garibaldi, MD, <Dictator> Florina Ou. Bonney Aid, MD Raynald Kemp MD ELECTRONICALLY SIGNED 10/01/2012 9:53

## 2014-05-15 NOTE — Discharge Summary (Signed)
PATIENT NAME:  Sally Walker, Sally Walker MR#:  [id number] DATE OF BIRTH:  02-Jul-1985  DATE OF ADMISSION:  04/22/2012 DATE OF DISCHARGE:  04/26/2012  ADMITTING PHYSICIAN: Dr. Huey Bienenstock  DISCHARGING PHYSICIAN:  Dr. Enid Baas  PRIMARY CARE PHYSICIAN:  At [facility]  CONSULTATIONS IN HOSPITAL:  Surgical consultation by Dr. Excell Seltzer  DISCHARGE DIAGNOSES: 1.  Acute on chronic abdominal pain. 2.  Paralytic ileus. 3.  Multiple abdominal surgeries done for diverticulitis in the past.  4.  Fibromyalgia.  5.  Seizure disorder.  DISCHARGE HOME MEDICATIONS:  1.  Fluoxetine 20 mg p.o. q. daily.  2.  Temazepam 15 mg p.o. at bedtime.  3.  Rabeprazole 20 mg p.o. q. daily.  4.  Lamictal 200 mg p.o. b.i.d.  5.  Lyrica 150 mg p.o. t.i.d.  6.  Quetiapine 400 mg p.o. q. daily at bedtime.  7.  Dilaudid 2 mg q. 6 hours p.r.n. for pain.  8.  Levaquin 500 mg p.o. q. daily for 3 more days.  DISCHARGE DIET:  Regular diet.   DISCHARGE ACTIVITY:  As tolerated.  FOLLOWUP INSTRUCTIONS:  PCP followup in 1 to 2 weeks.  LABORATORY AND IMAGING STUDIES:  WBC 6.0, hemoglobin 10.3, hematocrit 30.2, platelet count 292.  Sodium 141, potassium 3.6, chloride 109, bicarb 26, BUN 5, creatinine 0.56, glucose 97, calcium 8.3. Last abdominal x-ray also showing gaseous distention in the bowel of mid and right upper abdomen, hospital paralytic ileus versus early small bowel obstruction. Urine tox screen showing positive tricyclics and also opiates while in the hospital. CT of the abdomen and pelvis done with contrast on admission showing focal distention of distal small bowel up to 7 cm in diameter, prior radiosurgical intervention, so these changes could reflect postoperative changes. Localized ileus or obstruction cannot be excluded.  Lipase was within normal limits.  BRIEF HOSPITAL COURSE:  The patient is a young 29 year old female who has had recurrent diverticulitis in the past with multiple abdominal  surgeries, also history of fibromyalgia, arthritis, osteoporosis presents to the hospital secondary to abdominal pain, nausea, vomiting. 1.  Possible paralytic ileus  with acute on chronic abdominal pain. CT of the abdomen did not show any acute changes and x-ray kept showing paralytic ileus changes. The dilatation seen on the x-ray could probably represent postoperative changes from the CAT scan. However, since she was nauseous, she was kept n.p.o., given IV pain medication and also fluids.  She was recently treated for acute diverticulitis at [facility].  But because she was tender on exam even though the CT did not show any changes, she was started on Levaquin as an inpatient. She was seen with surgical consultation who did not recommend any changes. However, her nausea subsided. The patient wanted to eat a regular diet. She was placed on regular diet and she was ambulating well without any pain, but when the time for pain medication approached, she was requesting for pain medication.  She has not had any vomiting after she was eating regular food, so she was being discharged in a stable condition. Her other home medications were being continued at this time. She probably will have chronic abdominal pain from her multiple surgeries and adhesions since there is no acute complete obstruction and she was tolerating diet well, had a moment in passing flatus, she is being discharged home. Her course has been otherwise uneventful in the hospital.   DISCHARGE CONDITION:  Stable.   DISCHARGE DISPOSITION:  Home.  TIME SPENT ON DISCHARGE:  45  minutes     ____________________________ Enid Baas, MD rk:ce D: 04/26/2012 14:37:30 ET T: 04/26/2012 17:31:09 ET JOB#: [id number]  cc: Enid Baas, MD, <Dictator> Enid Baas MD ELECTRONICALLY SIGNED 05/10/2012 15:46
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
## 2014-05-15 NOTE — Consult Note (Signed)
Brief Consult Note: Diagnosis: abdominal pain, patient admitted with PID.   Patient was seen by consultant.   Consult note dictated.   Recommend further assessment or treatment.   Orders entered.   Discussed with Attending MD.   Comments: Patient seen and examined, full consult dictated [phone].  Patietn admitted with evaluation c/w PID, though history may not be reflective of that.  Patietn with extensive h/o abdominal instrumentation since 2010-2011.  I am unclear as to whether Crohns disease has been a consideration, since patient states all treatments and surgeries started with an episode of diverticulitis. Patietn currently stable and afebrile, on appropriate abx.  Recommend getting records from [facility] and [facility] both in [place].  Will obtain patietn on a iv ppi in regard to her history of nsaid use, and obtain a inflammatory bowel disease panel and h. pylori serology.  Should patietn show clinical changes in regards to fever increased abdominal pain or general condition, consider surgical consultation as well.  Electronic Signatures: Barnetta Chapel (MD)  (Signed 19-Jul-14 18:47)  Authored: Brief Consult Note   Last Updated: 19-Jul-14 18:47 by Barnetta Chapel (MD)

## 2014-05-15 NOTE — Consult Note (Signed)
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   Patient was seen by consultant.   Consult note dictated.   Recommend further assessment or treatment.   Orders entered.   Discussed with Attending MD.   Comments: Admit to gyn service, will treat presumptively as possible tubo ovarian abscess reportedly has been treated for gonorrhea/chlamydia negative PID in the past.  Given mutlioperated abdomen and read as possible fistula will get surgical input.  At present her abdomen is non-acute,she is afebrile, with no white count. - cefoxitin, doxycyline, flagyl ordered.  Electronic Signatures: Lorrene Reid (MD)  (Signed 05-Sep-14 15:41)  Authored: Brief Consult Note   Last Updated: 05-Sep-14 15:41 by Lorrene Reid (MD)

## 2014-05-15 NOTE — Consult Note (Signed)
PATIENT NAME:  Sally Walker, Sally Walker MR#:  [id number] DATE OF BIRTH:  October 13, 1985  DATE OF CONSULTATION:  04/27/2012  CONSULTING PHYSICIAN:  Adah Salvage. Excell Seltzer, MD  CHIEF COMPLAINT: Abdominal pain.   HISTORY OF PRESENT ILLNESS: This is a patient who is well known to me as she has been in the hospital for several days, admitted by the [facility] service and consulted first by Dr. Juliann Pulse and then by me, and I followed her every day in the hospital. She was discharged yesterday and returned last night with the same abdominal pain, nausea, but no emesis. States she is not passing gas and not having bowel movements, although she told me she had had a bowel movement yesterday. She denies fevers or chills. States that this is the same pain that she has been having previously, it just worsened. She has not eaten today.   For complete details, see previous note. She was just discharged yesterday.   PAST MEDICAL HISTORY:  1. Abdominal pain.  2. Fibromyalgia.  3. Arthritis.  4. Seizure disorder.  5. Osteoporosis.  6. Insomnia.   PAST SURGICAL HISTORY: Colon resection with ileostomy and ileostomy closure, appendectomy, cholecystectomy.   ALLERGIES: CYMBALTA, FENTANYL, MORPHINE, TRAMADOL, ULTRAM, VANCOMYCIN AND LATEX.   MEDICATIONS: Lamictal, Lyrica, fluoxetine, quetiapine, temazepam, rabeprazole and an oral analgesic.   SOCIAL HISTORY: The patient smokes tobacco. Does not drink alcohol or use drugs.   FAMILY HISTORY: Noncontributory.   REVIEW OF SYSTEMS: A 10-system review is performed and negative with the exception of that mentioned in the HPI.   PHYSICAL EXAMINATION:  GENERAL: Healthy, comfortable-appearing Caucasian female patient. She appears in no acute distress and is lying quietly in bed.  VITAL SIGNS: Temperature of 98.2, pulse of 79, respirations 18, blood pressure 137/74, 98% room air sat. Pain scale of 9.  HEENT: Shows no scleral icterus.  NECK: No palpable neck nodes.  CHEST:  Clear to auscultation.  CARDIAC: Regular rate and rhythm.  ABDOMEN: Soft, nondistended. Long midline complex scar is noted without hernias. Right lower quadrant transverse scar of recent vintage is soft, nontender, and no hernias noted. The abdomen is nondistended, non-tympanitic and only minimally tender in the right lower quadrant, essentially no change from exam yesterday or the day before or previously.  EXTREMITIES: Without edema.  NEUROLOGIC: Grossly intact.  INTEGUMENT: Shows no jaundice. Multiple piercings are noted.   LABORATORY VALUES: Essentially normal.  1. White blood cell count of 6.0, H and H of 10 and 30 yesterday at 0600. Yesterday at 2200, white count was 8.5, H and H of 11.8 and 35.9, with a platelet count of 343.  2. Electrolytes are within normal limits.  3. KUB is personally reviewed, nonobstructive pattern, stool in rectum and colon.   ASSESSMENT AND PLAN: This is a patient with abdominal pain of unclear etiology. She was sent home yesterday when she had had bowel movements and was tolerating a regular diet. She returns today with the same pain, is not having bowel movements at this time, but has not vomited. The etiology of her pain is unclear. Her labs and x-rays are essentially normal. I cannot understand the cause of her pain, and I discussed this first with Dr. Lavella Lemons and then with Dr. Cyril Loosen. Disposition was discussed with her. If she is admitted by the [facility] service, I would be happy to follow the patient while she is in the hospital as I have over the last week.    ____________________________ Adah Salvage. Excell Seltzer, MD rec:OSi D: 04/27/2012 09:48:16  ET T: 04/27/2012 11:50:29 ET JOB#: [id number]  cc: Adah Salvage. Excell Seltzer, MD, <Dictator> Lattie Haw MD ELECTRONICALLY SIGNED 04/27/2012 18:59

## 2014-05-15 NOTE — Consult Note (Signed)
Admit Diagnosis:   TUBO OVARIAN ABSCESS LEFT: Onset Date: 27-Sep-2012, Status: Active, Description: TUBO OVARIAN ABSCESS LEFT    PTSD:    Migraines:    Insomnia:    Osteoporosis:    Rheumatoid Arthritis:    Fibromyalgia:    Seizures:    Kidney Stones:    bowel obstruction:    Diverticulitis:    Ileostomy:   Home Medications: Medication Instructions Status  acetaminophen-oxyCODONE 325 mg-7.5 mg oral tablet 1 tab(s) orally every 4 to 6 hours, As needed, pain or fever Active  Lyrica 150 mg oral capsule 1 cap(s) orally 3 times a day Active  CeleXA 40 mg oral tablet 1 tab(s) orally once a day (at bedtime) Active  lamoTRIgine extended release 200 mg oral tablet, extended release 2 tab(s) orally once a day (at bedtime) Active   Lab Results: Hepatic:  05-Sep-14 10:17   Bilirubin, Total -  Alkaline Phosphatase -  SGPT (ALT) -  SGOT (AST) -  Total Protein, Serum -  Albumin, Serum -    15:35   Bilirubin, Total 0.2  Alkaline Phosphatase 108  SGPT (ALT) 12  SGOT (AST) 17  Total Protein, Serum 6.6  Albumin, Serum 3.4  Routine Micro:  05-Sep-14 11:13   Micro Text Report CHLAM/N.GC RT-PCR ([facility])   CHLAMYDIA                 CHLAMYDIA TRACHOMATIS NEGATIVE   N.GONORRHOEAE             N.GONORRHOEAE NEGATIVE   ANTIBIOTIC                       Lab:  05-Sep-14 10:15   Lactic Acid, Cardiopulmonary 1.2 (Result(s) reported on 27 Sep 2012 at 10:30AM.)  Routine Chem:  05-Sep-14 10:17   Glucose, Serum -  BUN -  Creatinine (comp) -  Sodium, Serum -  Potassium, Serum -  Chloride, Serum -  CO2, Serum -  Calcium (Total), Serum -  Osmolality (calc) -  eGFR (African American) -  eGFR (Non-African American) - (eGFR values <34m/min/1.73 m2 may be an indication of chronic kidney disease (CKD). Calculated eGFR is useful in patients with stable renal function. The eGFR calculation will not be reliable in acutely ill patients when serum creatinine is changing rapidly. It is  not useful in  patients on dialysis. The eGFR calculation may not be applicable to patients at the low and high extremes of body sizes, pregnant women, and vegetarians.)  Anion Gap -  Lipase -  Result Comment LIPASE/METC - SPECIMEN IS SEVERELY HEMOLYZED.  - NOTIFIED MARK WINSTEAD AT 1101 FOR  - RECOLLECT. 09/27/12-LAB  Result(s) reported on 27 Sep 2012 at 10:45AM.    15:35   Glucose, Serum 91  BUN  5  Creatinine (comp) 0.63  Sodium, Serum 139  Potassium, Serum 3.9  Chloride, Serum  110  CO2, Serum 25  Calcium (Total), Serum  8.3  Osmolality (calc) 274  eGFR (African American) >60  eGFR (Non-African American) >60 (eGFR values <690mmin/1.73 m2 may be an indication of chronic kidney disease (CKD). Calculated eGFR is useful in patients with stable renal function. The eGFR calculation will not be reliable in acutely ill patients when serum creatinine is changing rapidly. It is not useful in  patients on dialysis. The eGFR calculation may not be applicable to patients at the low and high extremes of body sizes, pregnant women, and vegetarians.)  Anion Gap  4  Lipase 114 (Result(s) reported on 27 Sep 2012 at 04:24PM.)  Routine UA:  05-Sep-14 11:12   Color (UA) Straw  Clarity (UA) Clear  Glucose (UA) Negative  Bilirubin (UA) Negative  Ketones (UA) Negative  Specific Gravity (UA) 1.057  Blood (UA) 2+  pH (UA) 6.0  Protein (UA) Negative  Nitrite (UA) Negative  Leukocyte Esterase (UA) Negative (Result(s) reported on 27 Sep 2012 at 05:12PM.)  RBC (UA) 51 /HPF  WBC (UA) 5 /HPF  Bacteria (UA) NONE SEEN  Epithelial Cells (UA) 7 /HPF (Result(s) reported on 27 Sep 2012 at 05:12PM.)  Routine Hem:  05-Sep-14 10:17   WBC (CBC) 8.4  RBC (CBC) 4.25  Hemoglobin (CBC) 12.8  Hematocrit (CBC) 37.4  Platelet Count (CBC)  461  MCV 88  MCH 30.2  MCHC 34.4  RDW  15.7  Neutrophil % 58.8  Lymphocyte % 24.6  Monocyte % 10.9  Eosinophil % 4.8  Basophil % 0.9  Neutrophil # 4.9   Lymphocyte # 2.1  Monocyte # 0.9  Eosinophil # 0.4  Basophil # 0.1 (Result(s) reported on 27 Sep 2012 at 10:45AM.)   Radiology Results:  Radiology Results: LabUnknown:    28-Aug-14 00:24, CT Abdomen and Pelvis With Contrast  PACS Image    05-Sep-14 11:30, CT Abdomen and Pelvis With Contrast  PACS Image  CT:    28-Aug-14 00:24, CT Abdomen and Pelvis With Contrast  CT Abdomen and Pelvis With Contrast  REASON FOR EXAM:    (1) ab pain r/o SBO; (2) ab pain r/o SBO  COMMENTS:       PROCEDURE: CT  - CT ABDOMEN / PELVIS  W  - Sep 19 2012 12:24AM     RESULT: History: Abdominal pain    Comparison:  None    Technique: Multiple axial images of the abdomen and pelvis were performed   from the lung bases to the pubic symphysis, with p.o. contrast and with   85 ml of Isovue 300 intravenous contrast.    Findings:  The lung bases are clear. There is no pneumothorax. The heart size is   normal.     The liver demonstrates no focal abnormality. There is no intrahepatic or   extrahepatic biliary ductal dilatation. The gallbladder is unremarkable.   The spleen demonstrates no focal abnormality. The kidneys, adrenal   glands, and pancreas are normal. The bladder is unremarkable. There   bilateral tubular enhancing adnexal structures likely representing   fallopian tubes.There is a tubular fluid-filled structure extending from   the colonic anastomosis towards the left fallopian to which may represent   thehydrosalpinx versus a fistula.    There is evidence of prior colonic resection with anastomosis. The distal   colon and is fluid filled. There is a enhancing 9 mm tubular structure   extending along the right iliac is muscle which may represent a     entero-enteric fistula versus a sinus tract. There is no   pneumoperitoneum, pneumatosis, or portal venous gas. There is a small   amount of pelvic free fluid extending into the left paracolic gutter.   There is no lymphadenopathy.     The  abdominal aorta is normal in caliber with atherosclerosis.    The osseous structures are unremarkable.    IMPRESSION:     1. Tubular enhancing bilateral adnexal structures likely representing   hydrosalpinx versus pyosalpinx.    2. There is a enhancing 9 mm tubular structure extending along the right     iliac is muscle which may represent a entero-enteric fistula  versus a   sinus tract.     3. There is a tubular fluid-filled structure extending from the colonic   anastomosis towards the left fallopian to which may represent the   hydrosalpinx versus a fistula.    Dictation Site: 1        Verified By: Jennette Banker, M.D., MD    05-Sep-14 11:30, CT Abdomen and Pelvis With Contrast  CT Abdomen and Pelvis With Contrast  REASON FOR EXAM:    (1) diffuse abd pain, rlq tenderness; (2) rlq   tenderness. recent ovarian cystect  COMMENTS:   May transport without cardiac monitor    PROCEDURE: CT  - CT ABDOMEN / PELVIS  W  - Sep 27 2012 11:30AM     RESULT: History: Diffuse abdominal pain    Comparison:  09/19/2012, 08/09/2012    Technique: Multiple axial images of the abdomen and pelvis were performed   from the lung bases to the pubic symphysis, without p.o. contrast and   with 100 ml of Isovue 370 intravenous contrast.    Findings:  The lung bases are clear. There is no pneumothorax. The heart size is   normal.     The liver demonstrates no focal abnormality. There is no intrahepatic or   extrahepatic biliary ductal dilatation. The gallbladder is surgically   absent.The spleen demonstrates no focal abnormality. The kidneys,   adrenal glands, and pancreas are normal. The bladder is unremarkable.     There is evidence of prior colonic resection with anastomosis. The distal   colon and is fluid filled. There is a enhancing 9 mm tubular structure   extending along the right iliac is muscle which may represent a   entero-enteric fistula versus a sinus tract.    There is no    pneumoperitoneum, pneumatosis, or portal venous gas. There is no   lymphadenopathy.   There is a small amount of pelvic free fluid. There is a 4.3 cm left   ovarian cystic mass likely resenting a simple cyst. If there is further   clinical concern recommend a pelvic ultrasound. There bilateral tubular   enhancing adnexal structures likelyrepresenting fallopian tubes.There is   a tubular fluid-filled structure extending from the colonic anastomosis   towards the left fallopian to which may represent the hydrosalpinx versus   a fistula.      The abdominal aorta is normal in caliber with atherosclerosis.    The osseous structures are unremarkable.    IMPRESSION:   1. Tubular enhancing bilateral adnexal structures likely representing   hydrosalpinx versus pyosalpinx.    2. There is a enhancing 9 mm tubular structure extending along the right   iliac is muscle which may represent a entero-enteric fistula versus a   sinus tract.     3. There is a tubular fluid-filled structure extending from the colonic   anastomosis towards the left fallopian to which may represent the   hydrosalpinx versus a fistula.          Dictation Site: 1    Verified By: Jennette Banker, M.D., MD    Cymbalta: Alt Ment Status, Dizzy/Fainting, N/V/Diarrhea  Ultram: N/V/Diarrhea, Itching  Tramadol: N/V/Diarrhea, Itching, Rash  Morphine: Itching, Other  vancomycin: Itching, Other, Rash  Fentanyl: N/V/Diarrhea, Itching  Latex: Itching, Rash  Adhesive: Other  Nursing Flowsheets: **Vital Signs.:   05-Sep-14 17:00  Vital Signs Type Admission  Temperature Temperature (F) 98.2  Celsius 36.7  Temperature Source oral  Pulse Pulse 82  Respirations Respirations  18  Systolic BP Systolic BP 016  Diastolic BP (mmHg) Diastolic BP (mmHg) 69  Mean BP 81  BP Source  if not from Vital Sign Device non-invasive  Pulse Ox % Pulse Ox % 94  Oxygen Delivery Room Air/ 21 %    19:19  Vital Signs Type Admission   Temperature Temperature (F) 97.8  Celsius 36.5  Temperature Source oral  Pulse Pulse 74  Respirations Respirations 18  Systolic BP Systolic BP 97  Diastolic BP (mmHg) Diastolic BP (mmHg) 64  Mean BP 75  Pulse Ox % Pulse Ox % 94  Oxygen Delivery Room Air/ 21 %    General Aspect Abdominal pain, nausea and emesis   Present Illness This is a 30 yo G0 s/p laproscopic converted to laparotomy bilateral ovarian cystectmy for hemorrhagic cyst with a multioperated abdomen, severe intra-abdominal adhesive disease.  She presents with a 3 day history of nausea and emesis, this is her second presentation to the ER this week.  She was supposed to have a postoperative follow up appointment with Dr. Cristino Martes today but went to the ER instead.  She reports being febrile at home to 100.9 although she has been afebrile here since presentation.  She reports complete po intolerance, states she has had several loose bowl movement over the last few days but none in the last 2 days.  Reports passage of flatus.  There has been no hematemesis, melena, or hematochezia.  She reports no drainage, increased pain, or changes at the incision site.  She took it upon herself to remove her stable on her own.   Case History and Physical Exam:  Chief Complaint Abdominal Pain   Family History Non-Contributory   HEENT Normocephalic anicteric   Neck/Nodes Supple   Chest/Lungs Clear   Breasts Not examined   Cardiovascular Normal Sinus Rhythm   Abdomen NABS, soft, non-distended, no rebound, no guarding., patient reports some diffuse lower abdominal pain on exam, incision is well healed there are 2 retained staples present   Genitalia Not examined   Rectal Not examined   Neurological Grossly WNL   Skin Warm    Impression 29 yo G0P0 s/p laparoscopic converted to laparotomy with bilateral cystectomies on 09/10/2012 for what was deemed endometriosis but final pathology read as compatible with hemorrhagic cyst .   Intraoperative both fallopian tubes had significant scarring and dilation.  There were significant adhesions noted of the bowl.   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 - start cefoxitin, doxycyline, and flagyl for TOA - obtain surgery consult - repeat CBC in AM - dilaudid pushes for pain control declines PCA   Electronic Signatures: Dorthula Nettles (MD)  (Signed 05-Sep-14 20:40)  Authored: Health Issues, Significant Events - History, Home Medications, Labs, Radiology Results, Allergies, Vital Signs, General Aspect/Present Illness, History and Physical Exam, Impression/Plan   Last Updated: 05-Sep-14 20:40 by Dorthula Nettles (MD)

## 2014-05-15 NOTE — Consult Note (Signed)
Brief Consult Note: Diagnosis: abdominal pain, chronic, s/p recent surgery, CT scan findings of no significance in recent port operative setting following extensive LOA.   Patient was seen by consultant.   Discussed with Attending MD.   Comments: no surgical intervention needed recommend follow up with GI medicine-  Dr Marva Panda as an outpatient.  Electronic Signatures: Natale Lay (MD)  (Signed 06-Sep-14 12:18)  Authored: Brief Consult Note   Last Updated: 06-Sep-14 12:18 by Natale Lay (MD)

## 2014-05-15 NOTE — Consult Note (Signed)
Brief Consult Note: Diagnosis: abd pain.   Patient was seen by consultant.   Consult note dictated.   Recommend further assessment or treatment.   Discussed with Attending MD.   Comments: Pt known from recent admission. No obv source for pain. Nonobstructive, no emesis. No change in exam. No peritoneal signs, nondistended, nontympanitic. discussed with Dr Arnoldo Morale and Cyril Loosen.  Electronic Signatures: Lattie Haw (MD)  (Signed 05-Apr-14 09:41)  Authored: Brief Consult Note   Last Updated: 05-Apr-14 09:41 by Lattie Haw (MD)

## 2014-05-15 NOTE — Consult Note (Signed)
PATIENT NAME:  Sally Walker, Sally Walker MR#:  [id number] DATE OF BIRTH:  06-10-1985  DATE OF CONSULTATION:  08/10/2012  CONSULTING PHYSICIAN:  Christena Deem, MD  REFERRING PHYSICIAN:  Dr. Madelin Headings and Deatra Robinson, NP   REASON FOR CONSULTATION:  Abdominal pain.   HISTORY OF PRESENT ILLNESS:  Sally Walker is a pleasant, 29 year old female, who was admitted through the OB/GYN service from the Emergency Room in the early morning hours today. 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 However, on further review, her nurse practitioner felt that a GI consult would be appropriate due to abnormal CT findings and her strong history. The patient states that she has been having abdominal pain for about 1-1/2 weeks or so. It is epigastric, moving toward the right side, sharp and crampy in nature, near constant. It has been getting worse with time. There has been some nausea, with a small amount of vomiting. Her bowel movements have been variable, up to 5 or 6 times a day. She has seen some black or tarry stools, perhaps once or twice a week, several weeks ago. She has no known history of peptic ulcer disease. She has been taking ibuprofen on a fairly regular basis, up to 2 or 3 per day. She has a very strong history of abdominal surgeries. She apparently had an episode of diverticulitis in the summer of 2011. She had a partial colon resection at that time. There was then apparently a bowel obstruction that occurred, leading her back to surgery with the placement of an ileostomy at that time. She states that was then internalized in about March 2012. She had an apparent leak of the anastomotic site, requiring her to undergo further surgeries, with several episodes of small bowel obstruction. There was a second ileostomy done, which was then reversed later in October 2013. Since that time, she has continued to have a baseline chronic right-sided abdominal pain that she rates at a 4 to 5/10. 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 When she came to the Emergency Room again, her pain was increased and related on the right side of the abdomen. There have been several evaluations in the Emergency Room since July 14. On her evaluation early this morning, she also underwent CT scanning for abdominal pain, leukocytosis, which showed a moderate amount of pelvic free fluid, dilatation of the right fallopian tube, which may reflect hydrosalpinx versus pyosalpinx. In the right lower quadrant, anterior to the iliac muscle, there was a 10 mm peripherally enhancing collection concerning for a small abscess. Overall appearance concerning for PID, and a GYN consultation was recommended. A pelvis/abdominal transvaginal ultrasound was done last night as well, this showing bilateral fluid collections, which may represent loculated fluid versus hydrosalpinx versus hemosalpinx versus pyosalpinx. Again, the thought being this is related to pelvic inflammatory disease.   PAST MEDICAL HISTORY:  In addition to the extensive history above,  patient has a history of cholecystectomy done for acute cholecystitis in 2008. She has had a history of nephrolithiasis with multiple occasions since the age of 9. She has a history of PTSD, depression, fibromyalgia, anxiety, a history of seizure disorder classified as epilepsy. She has had no seizure, she states, for about 5 years. She has a history of acute pancreatitis, with 4 to 5 episodes since 2011. She states that "Every now and then my liver will act up."   OUTPATIENT MEDICATIONS INCLUDE:  Bactrim DS 800/160 mg 1 twice a day, Celexa 40 mg a day, lamotrigine 200 mg 2 tablets once a day, Lyrica 150 mg 3 times a day, trazodone 300 mg once a day.   ALLERGIES: CYMBALTA, FENTANYL, MORPHINE, TRAMADOL, ULTRAM, VANCOMYCIN and LATEX.   GI FAMILY HISTORY:  Pertinent for 2 cousins with Crohn's  disease. History of mother having pancreatitis as well as fatty liver. Father, grandfather and grandmother with peptic ulcer disease. No history of colorectal cancer in the family.   REVIEW OF SYSTEMS:  Occasional fever, low grade, for about 1 to 2 weeks.   PHYSICAL EXAMINATION:  VITAL SIGNS:  Showed a temperature of 98.1, pulse 85, respirations 20, blood pressure 97/57.  GENERAL:  Currently, patient seems to be comfortable in the bed.  HEENT:  Normocephalic, atraumatic.  EYES:  Anicteric.  NOSE:  Septum midline. No lesions.  OROPHARYNX:  Fair dentition. No other lesions.  NECK:  Supple. No JVD.  HEART:  Regular rate and rhythm.  LUNGS:  Clear.  ABDOMEN:  Soft. She is markedly tender to palpation throughout the right abdomen, more so in the mid area between the upper and lower quadrants. There are no masses or rebound. The left side of the abdomen has minimal discomfort. She has multiple scars over the abdomen extending from above the umbilicus to almost the pelvic brim, consistent with her surgical histories. There is no rebound. There are no masses noted.  EXTREMITIES:  No clubbing, cyanosis or edema.  NEUROLOGICAL: Cranial nerves II through XII grossly intact. Muscle strength bilaterally equal and symmetric. DTRs bilaterally equal and symmetric.   LABORATORIES INCLUDE THE FOLLOWING: On July 14, she had a metabolic panel showing a chloride slightly elevated at 111, otherwise normal, with a lipase of 337. She had a hepatic profile at that time showing normal. 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 Her hemogram on that date showed a white count of 10.0, hemoglobin/hematocrit 12.1/36.5, platelet count of 338. She had repeat hemograms done on July 16 and July 18, both in the ER, these showing a white count of 13.0 and then 11.4, respectively. Labs otherwise unremarkable. She had repeat labs early this morning, showing a white count of 8.3, H and H 11.7/34.6, platelet count of 271. She  has had a wet prep, showing no trichomonas, yeast clue cells or spermatozoa, rare white blood cells. She has had testing, indicating negative gonorrhea, negative Chlamydia trachomatis. She has had a urine culture from the 18th that showed no growth. Serial urinalysis from July 14, July 16 and July 18 show a progressive decrease of red blood cells in the urine as well as a progressive decrease of white blood cells in the urine, that being from 32 per high-power field to 9 per high-power field. She had a CT scan of the abdomen yesterday again for abdominal pain, leukocytosis. This read as consistent with pelvic inflammatory disease. Results of her pelvic ultrasound as noted above.   ASSESSMENT: The patient presenting with right-sided abdominal pain of a chronic nature, although increasing in intensity and severity over the period of the past 2 weeks. There is no evidence on scanning of free air. The CT scan indicates the stomach, duodenum, small intestine and large intestine demonstrate no contrast extravasation or dilatation. No pneumoperitoneum, pneumatosis, or portal venous gas. No lymphadenopathy. The patient has an extensive history of abdominal instrumentation beginning with apparently diverticulosis in 2012. She does have a fairly significant family history of Crohn's disease. I am uncertain  as to whether she has ever been evaluated in that regard. She states that her last colonoscopy was February 2013 at a remote hospital,  as well as a remote EGD. The hospital where she had her multiple surgeries done was the [facility] in [place], [place].   RECOMMENDATIONS: 1.  Continue therapy as you are for possible PID.   2.  Will need to obtain records from [facility] in [place], [place] in regards to her previous luminal evaluations as well as multiple surgeries since 2010.  3.  Will obtain an inflammatory bowel disease panel proprietary through  [facility]. Also will obtain a Helicobacter pylori study. Her lipase was normal previously. Will consider EGD, depending on clinical status early in the week.    ____________________________ Christena Deem, MD mus:mr D: 08/10/2012 18:40:28 ET T: 08/10/2012 20:09:27 ET JOB#: [id number]  cc: Christena Deem, MD, <Dictator> Christena Deem MD ELECTRONICALLY SIGNED 08/19/2012 7:15

## 2014-05-15 NOTE — Consult Note (Signed)
PATIENT NAME:  Sally Walker, Sally Walker MR#:  [id number] DATE OF BIRTH:  [birth year]  DATE OF CONSULT:  04/22/2012  CONSULTING PHYSICIAN:  Harrell Gave A. Robben Jagiello, MD  REASON FOR CONSULTATION:  Abdominal pain, right lower quadrant.   HISTORY OF PRESENT ILLNESS: The patient is a pleasant 29 year old female with a history of sigmoid diverticulitis, status post sigmoid colectomy with previous diverting loop ileostomy and ileostomy closure, as well as what she says 6 other surgeries, who presents with now 6 days of right lower quadrant and suprapubic pain. She says that her pain began suddenly 6 days ago. She presented to the ED at that time. She was thus transferred to [facility] where her most recent surgeries were obtained. According to her, she said she was given antibiotics for an abscess and admitted overnight for pain control. She was then discharged to home. She returns here with persistent pain. She also says that she has nausea, vomiting. She says that her last bowel movement was 2 days ago and that she normally has 4 to 5 a day. She states that she has a history of bowel obstructions in the past. She says that her last emesis was today after being at the hospital. Says no fevers or chills. No night sweats, shortness of breath, cough, dysuria, or hematuria. Upon arrival she was bladder-scanned and does have 700 mL urine in her bladder and a Foley catheter was placed, and she says that this did provide partial resolution of her symptoms.   PAST MEDICAL HISTORY:  1. History of diverticulitis, status post sigmoid colectomy, status post diverting loop ileostomy. These were at a hospital in [place] per her.  2.  History of ileostomy closure in [place], as well.  3.  History of small bowel obstruction requiring bowel resection in the past.  4.  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  5.  History of wound infection, with wound VAC.  6.  History of  fibromyalgia.  7.  History of seizure disorder.   HOME MEDICATIONS:  Are as follows:  1. Temazepam.  2.  Rabeprazole.  3.  Seroquel.  4.  Lyrica.   5.  Lamictal.   6.  Fluoxetine.   ALLERGIES:  1.  MORPHINE.  2.  VANCOMYCIN.  3.  CYMBALTA.  4.  FENTANYL. 5.  ULTRAM.  6.  TRAMADOL.   FAMILY HISTORY: Noncontributory.   SOCIAL HISTORY: Denies tobacco and alcohol use.   REVIEW OF SYSTEMS:  Twelve-point review of systems  was obtained. Pertinent positives and negatives as above.   PHYSICAL EXAM: VITAL SIGNS: Temperature 98, pulse 83, blood pressure 116/71, respirations 20; 95% on room air.  GENERAL: No acute distress. Alert and oriented x3. Appears to have slurred speech.  HEAD: Normocephalic, atraumatic.  EYES: No scleral icterus. No conjunctivitis.  CHEST: Lungs clear to auscultation. Moving air well.  HEART: Regular rate and rhythm. No murmurs, rubs, or gallops.  ABDOMEN: Soft, tender to palpation, right lower quadrant, but is easily distracted. Has a previous ileostomy site closure scar as well as a midline scar which appears to have dehisced. May have a hernia, but this may also be just thickened skin at that point.  EXTREMITIES: Moves all extremities well. Strength 5/5.  NEUROLOGIC: Cranial nerves II-XII grossly intact. Sensation intact in all 4 extremities.   LABS: Are significant for a potassium of 3.5, a bicarb of 22, alk phos of 154, AST of 82.   White cell  count of 14.1, which was up from previous 10.1 four days ago, which was 60% neutrophils.  Urinalysis is negative.   CT scan shows contrast in the stomach. Does have a dilated sigmoid colon proximal to the anastomosis as well as a dilated ileum at point of previous anastomosis.   ASSESSMENT AND PLAN: The patient is a pleasant 29 year old female with right lower quadrant pain. It partially resolved with a Foley catheter. It is possible that she has urinary retention, potentially from narcotics which she appears to  be on, which she reports taking  2 hydrocodone before she presented. Her CT scan does show some small bowel dilatation, but this is perianastomotic and I believe postoperative. She does have some fluid in her abdomen. Unsure what the etiology of leukocytosis is. I do not see an obvious surgical abdomen at this time but will continue to follow while in-house. Would recommend an x-ray in the a.m. to see if contrast passed in the colon, as this would eliminate the possibility of obstruction as an etiology to pain. Will follow with you.      ____________________________ Sally Norfolk. Trinidi Toppins, MD cal:dm D: 04/22/2012 02:52:00 ET T: 04/22/2012 07:26:52 ET JOB#: [id number]  cc: Harrell Gave A. Walda Hertzog, MD, <Dictator> Floyde Parkins MD ELECTRONICALLY SIGNED 04/29/2012 10:02

## 2014-05-15 NOTE — Consult Note (Signed)
Brief Consult Note: Diagnosis: Bipolar disorder.   Patient was seen by consultant.   Recommend further assessment or treatment.   Orders entered.   Comments: Will admitt to psychiatry.  Electronic Signatures: Kristine Linea (MD)  (Signed 06-Oct-14 13:47)  Authored: Brief Consult Note   Last Updated: 06-Oct-14 13:47 by Kristine Linea (MD)

## 2014-05-15 NOTE — Consult Note (Signed)
PATIENT NAME:  Sally Walker, Sally Walker MR#:  [id number] DATE OF BIRTH:  [birth year]  DATE OF CONSULTATION:  10/27/2012  REFERRING PHYSICIAN:   CONSULTING PHYSICIAN:  Carnelia Oscar K. Guss Bunde, MD  PLACE OF DICTATION:  [facility], [facility].  AGE:  29 years.  SEX: Female.  RACE:  White.  SUBJECTIVE: The patient was seen in consultation in [facility].  The patient is a 29 year old white female, not employed and not married, and has been living with her brother.  The patient reports that she was brought here for help because she was complaining of pain, and she is on medications.  According to the information obtained, the patient tried to take intentional overdose, which patient denies, and she said she took medications only to help her with her pain secondary to all the scars that were there because of surgeries for her diverticulitis. The patient is not employed, and is on disability for multiple physical problems.  PAST PSYCHIATRIC HISTORY:  No history of inpatient admissions into psychiatry.    No history of suicide attempt. Gets psychiatric help on outpatient basis for her chronic problems. In fact, patient reports that she is being followed at [facility] in [facility], which is a detox center, for taking medications like she used to. In addition, she is being followed by Dr. Jill Side for her multiple physical problems. The patient has multiple physical problems, which include diverticulitis, with multiple surgeries for the same and part of her colon removed. In addition, she has osteopenia.  OBJECTIVE: The patient was dressed in hospital clothes, alert and oriented, calm, pleasant, and cooperative.  She knew that she was in the hospital, but did not know the capital of [place] because she has just moved from [place].  Affect is appropriate with her mood, which is lowering down and depressed. She appears depressed because of being in chronic pain and distress, and not being helped.  Current  medications, that is, Celexa 40 mg not holding her depression. However, she denies any suicidal or homicidal plans, and wants to get help. Denies feeling hopeless or helpless.  No psychosis. Denies auditory or visual hallucinations. Cognition is intact.  Denies any ideas or plans to hurt herself.  Is eager to go home and get help for this chronic pain related to her multiple physical problems.  Insight and judgment guarded.  IMPRESSION:  Mood disorder, secondary to multiple physical problems and chronic pain.  PLAN:  Will discontinue Celexa; this is not helping her.  Start patient on Effexor XR 75 mg p.o. daily to help her with her depression.  Continue the rest of the medications.  Mr. Casandra Doffing is to evaluate the patient tomorrow, that is, 10/28/2012, for appropriate disposition with adequate follow up as she just moved from [place].    ____________________________ Jannet Mantis. Guss Bunde, MD skc:cg D: 10/27/2012 22:07:00 ET T: 10/28/2012 01:51:09 ET JOB#: [id number]  cc: Monika Salk K. Guss Bunde, MD, <Dictator> Beau Fanny MD ELECTRONICALLY SIGNED 10/28/2012 16:05

## 2014-05-15 NOTE — Consult Note (Signed)
PATIENT NAME:  Sally Walker, Sally Walker MR#:  [id number] DATE OF BIRTH:  [birth year]  DATE OF CONSULTATION:  10/27/2012  CONSULTING PHYSICIAN:  Cristal Deer A. Foy Vanduyne, MD  REASON FOR CONSULT: Barbiturate overdose and acute on chronic abdominal pain.   Sally Walker is a pleasant 29 year old female who had been on admitted on multiple occasions for abdominal pain. She has had a history of colon resections and ostomies and ostomy reversals in [place]. She also underwent an attempted operative lysis of adhesions with OB/GYN. She presents today after taking 25 barbiturates in what she says an attempt to control her pain. She says that she always has abdominal pain. This is a little bit different, it tends to be more in the right lower quadrant, she calls it pulling. Otherwise, no fevers, chills, night sweats, shortness of breath, weight loss, chest pain. Does report nausea, no vomiting. No diarrhea, constipation, dysuria, hematuria.   PAST MEDICAL HISTORY: As follows:  History of left colectomy and diverting ileostomy and ileostomy reversal. History of appendectomy, cholecystectomy, history of small bowel obstructions requiring laparotomy and small bowel resection. History of ovarian cysts. History of a possible recent laparotomy with lysis of adhesions, removal of right endometrium, and drainage of left ovarian cyst.   ALLERGIES: HISTORY OF MULTIPLE ALLERGIES AVAILABLE ON THE CHART INCLUDING: CYMBALTA, MORPHINE, VANCOMYCIN, LATEX, FENTANYL, ULTRAM, TRAMADOL, ADHESIVE.   HOME MEDICATIONS: Bactrim. Lyrica, Lamictal, gabapentin, Celexa and Percocet.   SOCIAL HISTORY: Does use tobacco. 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 Denies alcohol use.   FAMILY HISTORY: Noncontributory.   REVIEW OF SYSTEMS: Twelve-point review of systems has been obtained. Pertinent positives and negatives as per in the HPI.   PHYSICAL EXAMINATION: VITAL SIGNS: Temperature 98.3, pulse 80, blood pressure 106/60, respirations 16, 99%  on room air.  GENERAL: No acute distress. Alert and oriented x 3. Appears to be slurring.  HEAD: Normocephalic, atraumatic.  EYES: No scleral icterus. No conjunctivitis.  CHEST: Lungs clear to auscultation. Moving air well.  HEART: Regular rate and rhythm. EXTREMITIES: Moves all extremities well. Strength 5/5. NEUROLOGICAL: Cranial nerves II through XII grossly intact. Sensation intact in all four extremities.   LABORATORY DATA: White cell count of 10. Urinalysis, which is negative nitrate, negative leukocyte esterase, but does have some blood. Urinalysis positive for MDMA, positive for opiates, positive for barbiturates.   IMAGING: Plain x-ray, nonspecific bowel gas pattern, does have a central area which appears to be distended, somewhat air-filled loop of bowel, likely looped persistently, dilated colocolonic anastomosis as seen previously.   ASSESSMENT AND PLAN: Sally Walker is a pleasant 29 year old with history of chronic abdominal pain, who presents with what she says is worsening right lower quadrant pain for which she tried to control with a large amount of barbiturates. No obvious surgical indications at this time, at least from general surgery. May consider OB/GYN consult as they have most recently operated on her, and she was most recently admitted to their service for this chronic pain. No obvious indication for CT at this time, but may consider per ED team.     ____________________________ Sally Walker. Kinneth Fujiwara, MD cal:sg D: 10/27/2012 09:38:26 ET T: 10/27/2012 10:41:33 ET JOB#: [id number]  cc: Cristal Deer A. Sanora Cunanan, MD, <Dictator> Jarvis Newcomer MD ELECTRONICALLY SIGNED 11/04/2012 21:12

## 2014-05-15 NOTE — Consult Note (Signed)
Chief Complaint:  Subjective/Chief Complaint seen for abdominal pain.  patient feeling some better today though still with abdominal pain, closer to her baseline chronic abdominal pain.  no n/v, tolerating po.   VITAL SIGNS/ANCILLARY NOTES: **Vital Signs.:   20-Jul-14 08:45  Vital Signs Type Routine  Temperature Temperature (F) 98.2  Celsius 36.7  Temperature Source oral  Pulse Pulse 97  Respirations Respirations 18  Systolic BP Systolic BP 114  Diastolic BP (mmHg) Diastolic BP (mmHg) 68  Mean BP 83  Oxygen Delivery Room Air/ 21 %  Pulse Ox Heart Rate 6    11:54  Vital Signs Type Q 4hr  Temperature Temperature (F) 99.2  Celsius 37.3  Temperature Source oral  Pulse Pulse 92  Respirations Respirations 16  Systolic BP Systolic BP 110  Diastolic BP (mmHg) Diastolic BP (mmHg) 65  Mean BP 80  Pulse Ox % Pulse Ox % 99   Brief Assessment:  Cardiac Regular   Respiratory clear BS   Gastrointestinal details normal Soft  Nondistended  Bowel sounds normal  No rebound tenderness  tender to palpation in the right mid abdomen and llq   Lab Results: Routine Hem:  19-Jul-14 05:14   WBC (CBC) 8.3  RBC (CBC) 4.02  Hemoglobin (CBC)  11.7  Hematocrit (CBC)  34.6  Platelet Count (CBC) 271  MCV 86  MCH 29.1  MCHC 33.8  RDW  18.2  Neutrophil % 50.5  Lymphocyte % 32.4  Monocyte % 13.4  Eosinophil % 3.0  Basophil % 0.7  Neutrophil # 4.2  Lymphocyte # 2.7  Monocyte #  1.1  Eosinophil # 0.3  Basophil # 0.1 (Result(s) reported on 10 Aug 2012 at 05:48AM.)   Assessment/Plan:  Assessment/Plan:  Assessment 1) h/o multiple abdominal instrumentations, as outlined in consult.  H pylori serology and IBD panel drawn.  feeling better, tolerating po, wbc decreased to normal range.  On abx po.  Patient would like to go home.   Plan 1) D/C ok with close fu in GI clinic.  will need to fu on labs.  Pain control meds discussed with patient.  Case discussed with Ms Deatra Robinson.  Will s/o,  reconsult as needed.   Electronic Signatures: Barnetta Chapel (MD)  (Signed 20-Jul-14 16:02)  Authored: Chief Complaint, VITAL SIGNS/ANCILLARY NOTES, Brief Assessment, Lab Results, Assessment/Plan   Last Updated: 20-Jul-14 16:02 by Barnetta Chapel (MD)

## 2014-05-15 NOTE — H&P (Signed)
PATIENT NAME:  Sally Walker, Sally Walker MR#:  [id number] DATE OF BIRTH:  02-Nov-1985  DATE OF ADMISSION:  04/22/2012  REFERRING PHYSICIAN: Lurline Hare.   PRIMARY CARE PHYSICIAN: Dr. Hortencia Pilar.   CHIEF COMPLAINT: Abdominal pain, nausea and vomiting.   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 The patient presents today with complaints of nausea, vomiting, abdominal pain, reports for 2 days but by reviewing her previous records, she was recently in the ED on March 24th when she was transferred to [facility] where she reports she stayed overnight and she was discharged on p.o. Cipro for what she thinks was an abscess. The patient presents today. Denies any fever, chills. With similar complaints as then. She had leukocytosis of 14,000 but was afebrile. She had CT abdomen and pelvis with oral contrast done in ED which did show focal distention of the distal small bowel up to 7 cm, which may be postoperative in nature but localized ileus or obstruction could not be excluded. The patient was lethargic and drowsy upon presentation, where she has been medicating with p.o. hydrocortisone. As well, the patient was noticed to be in urinary retention where she had a bladder scan showing more than 900 mL. After Foley insertion, she had 1000 mL of urine output immediately. The patient was seen by surgical service in ED and they think CAT scan findingsare most likely related to her postop changes. He did not think she had any obstruction but at the same time ileus could not be ruled out, so hospitalists were requested to admit the patient.   PAST MEDICAL HISTORY:  1. Fibromyalgia.  2. Rheumatoid arthritis.   3. History of seizures.  4. Insomnia.  5. Osteoporosis.   PAST SURGICAL HISTORY:  1. 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  2. Appendectomy.  3. Cholecystectomy.  4. Upper endoscopy.   ALLERGIES:  1. CYMBALTA.  2. FENTANYL.  3. MORPHINE.  4. TRAMADOL.  5. ULTRAM.  6. VANCOMYCIN.  7. LATEX.   HOME MEDICATIONS:  1. Lamictal 200 mg oral daily.  2. Lyrica 150 mg oral 3 times a day.  3. Fluoxetine 20 mg oral daily.  4. Quetiapine 400 mg oral at bedtime.  5. Temazepam 15 mg as needed.  6. Rabeprazole 20 mg oral daily.   SOCIAL HISTORY: The patient smokes 1 pack per day. Denies any alcohol or illicit drug use.   FAMILY HISTORY: Significant for diabetes mellitus and coronary artery disease.   REVIEW OF SYSTEMS:  CONSTITUTIONAL: Denies fever, chills, fatigue, weakness.  EYES: Denies blurry vision, double vision, pain, inflammation.  ENT: Denies tinnitus, ear pain, hearing loss, epistaxis or discharge.  RESPIRATORY: Denies cough, wheezing, hemoptysis, COPD.   CARDIOVASCULAR: Denies chest pain, edema, arrhythmia, palpitations, syncope.  GASTROINTESTINAL: Complains of nausea, vomiting, abdominal pain and constipation. Denies diarrhea, hematemesis, coffee-ground emesis, bright red blood per rectum or jaundice.  GENITOURINARY: Denies dysuria, hematuria, renal colic, even though she had urinary retention of 1000 mL.  ENDOCRINE: Denies polyuria, polydipsia, heat or cold intolerance.  HEMATOLOGY: Denies anemia, easy bruising, bleeding diathesis.  INTEGUMENTARY: Denies acne, rash or skin lesions.  MUSCULOSKELETAL: Denies any gout, arthritis, cramps or limited activity.  NEUROLOGIC: Denies vertigo, ataxia, dementia, headache, epilepsy, dysarthria.  PSYCHIATRIC: Denies schizophrenia. Denies a history of substance or alcohol abuse. Reports history of insomnia.  PHYSICAL EXAM:  VITAL SIGNS: Temperature 98, pulse 83, respiratory rate 20, blood pressure 116/71, saturating 95% on room air.  GENERAL: Young female who looks comfortable in bed, in no apparent  distress, sleepy.  HEENT: Head atraumatic, normocephalic. Pupils equal, reactive to light. Pink conjunctivae. Anicteric sclerae. Moist oral mucosa.  NECK: Supple. No thyromegaly. No JVD.  CHEST: Good air entry bilaterally. No wheezing, rales, rhonchi.  CARDIOVASCULAR: S1, S2 heard. No rubs, murmurs or gallops.  ABDOMEN: Has abdominal tenderness to palpation but no rebound and no guarding. Has multiple abdominal surgical scars.  EXTREMITIES: No edema. No clubbing. No cyanosis.  SKIN: Has multiple skin tattoos. Normal skin turgor. Warm and dry.  PSYCHIATRIC: The patient is lethargic but communicative, responsive. Awake, alert x3. NEUROLOGIC: Cranial nerves grossly intact. Motor 5 out of 5.   PERTINENT LABS: Glucose 123, BUN 7, creatinine 0.90, sodium 139, potassium 3.5, chloride 106. Total protein 7.7, alk phos 154, AST 82, ALT 16. White blood cells 14.1, hemoglobin 11.8, hematocrit 34.9, platelets 319. Urinalysis negative.   CT abdomen and pelvis shows distal small colon focal distention 7 cm which may be postoperative in nature but localized ileus or obstruction could not be excluded.   ASSESSMENT AND PLAN:  1. Abdominal pain, nausea, vomiting: The patient was seen by surgery who does not think there is an obstruction or any surgical intervention needed at this point. The finding on the CAT scan they think is most likely postop changes, but she was taking a lot of hydrocodone which might be causing her to have ileus. The patient will be admitted. Will be kept on clear liquid diet and advanced as tolerated. Will check KUB in a.m. The patient reports history of abscess which could not be appreciated on CT abdomen and pelvis done here, so will obtain the discharge documentation from [facility]. Until then, she will be kept empirically on intravenous Cipro and Flagyl. As well, urinary retention might be contributing to her initial symptoms. Will try to avoid intravenous Dilaudid given the fact she has  MULTIPLE ALLERGIES TO INTRAVENOUS AND OTHER PAIN MEDICATION given the fact she has ileus and narcotics might be contributing to this.  2. Urinary retention: The patient had 1000 mL after Foley insertion. This is most likely from pain medication. Will attempt to a voiding trial in the a.m. and will avoid narcotics.  3. History of seizures: Continue on Lamictal.   4. Fibromyalgia: Continue on Lyrica.  5. History of depression: Continue on fluoxetine.  6. Deep vein thrombosis prophylaxis: Sequential compression device.  7. Gastrointestinal prophylaxis: On proton pump inhibitor.   CODE STATUS: FULL CODE.   TOTAL TIME SPENT ON ADMISSION AND PATIENT CARE: 55 minutes.   ____________________________ Albertine Patricia, MD dse:gb D: 04/22/2012 03:16:25 ET T: 04/22/2012 05:12:35 ET JOB#: [id number]  cc: Albertine Patricia, MD, <Dictator> Aundreya Souffrant Graciela Husbands MD ELECTRONICALLY SIGNED 04/23/2012 1:48
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
## 2015-03-16 IMAGING — CT CT ABD-PELV W/ CM
2 of 4 series · 16 of 46 positions shown, 18 images · IV contrast (isovue)
Comparison: DG ABDOMEN 2V dated 05/20/2013; CT ABD-PELV W/ CM dated
09/27/2012

CLINICAL DATA: Diffuse abdominal pain, recent history of
pancreatitis. Recent urinary tract infection. History of multiple
abdominal surgeries.

EXAM:
CT ABDOMEN AND PELVIS WITH CONTRAST
TECHNIQUE: Multidetector CT imaging of the abdomen and pelvis was performed
using the standard protocol following bolus administration of
intravenous contrast.
CONTRAST:  100 cc of Isovue 370

[Series 2: routine abd pel with · axial · 0.66mm/px · z∈[-428,-38]mm · 13 of 89 slices shown, 15 images]
[im 7/89  soft-tissue]
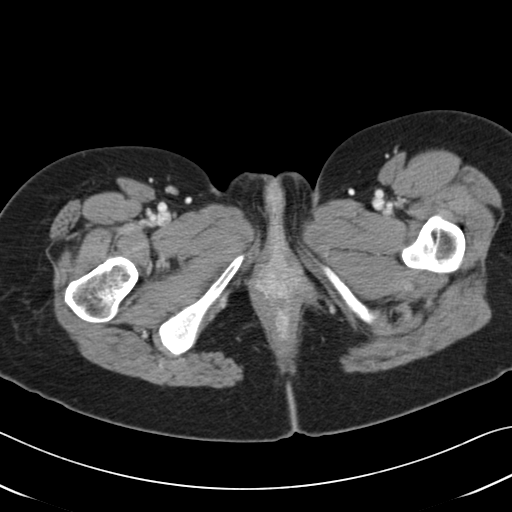
[im 7/89  bone]
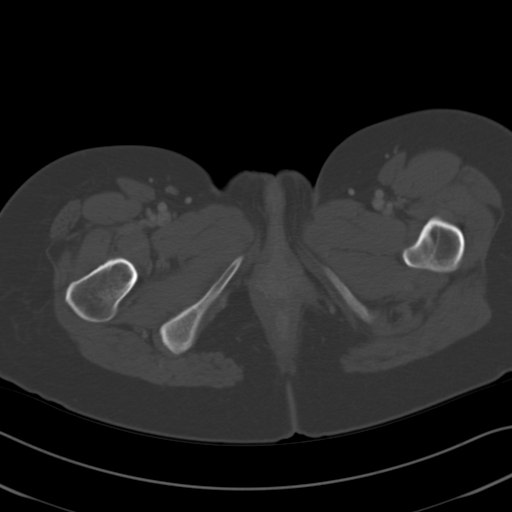
[im 14/89  soft-tissue]
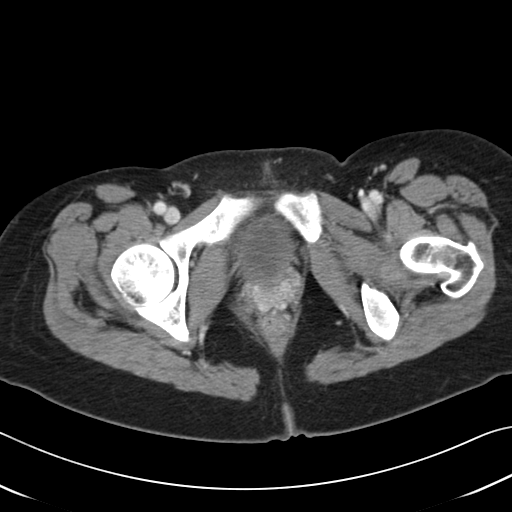
[im 20/89  soft-tissue]
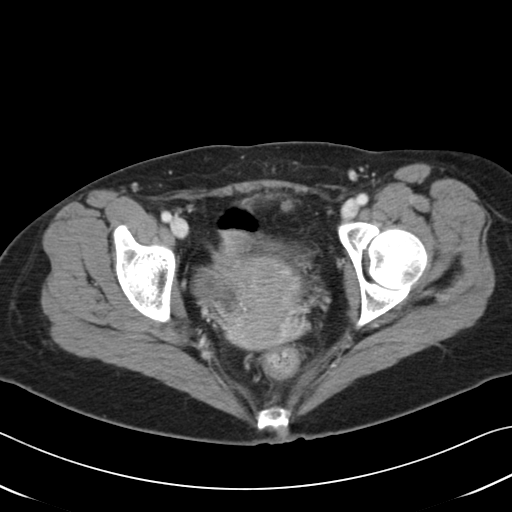
[im 27/89  soft-tissue]
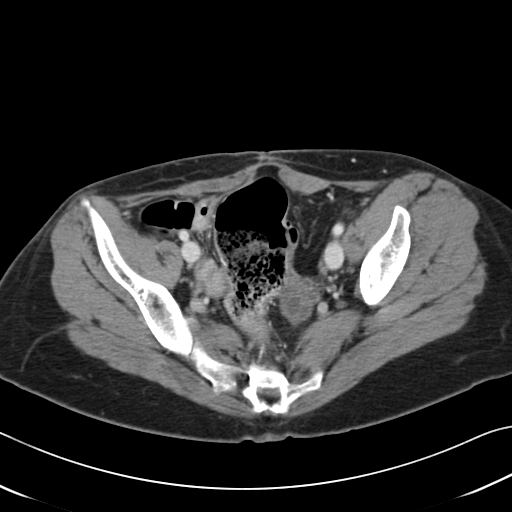
[im 33/89  soft-tissue]
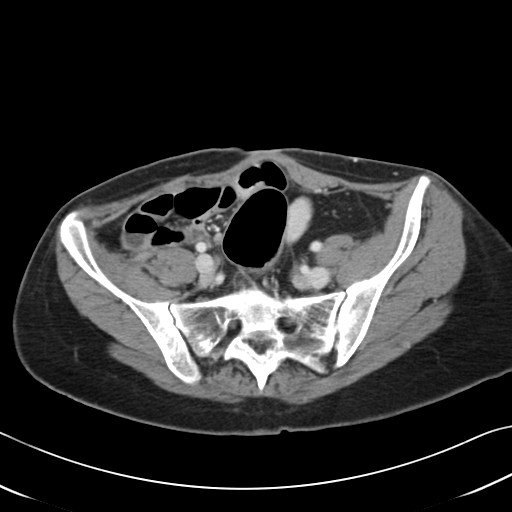
[im 40/89  soft-tissue]
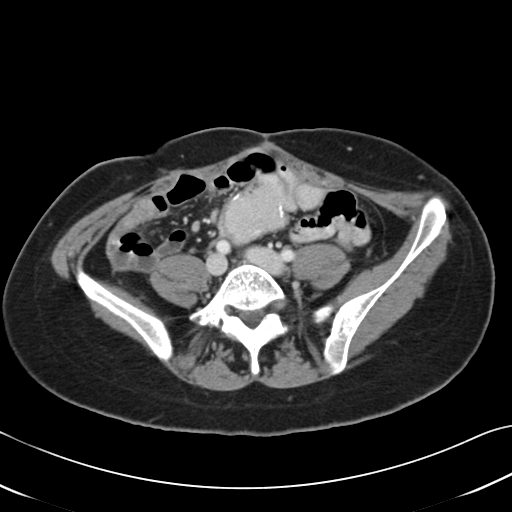
[im 46/89  soft-tissue]
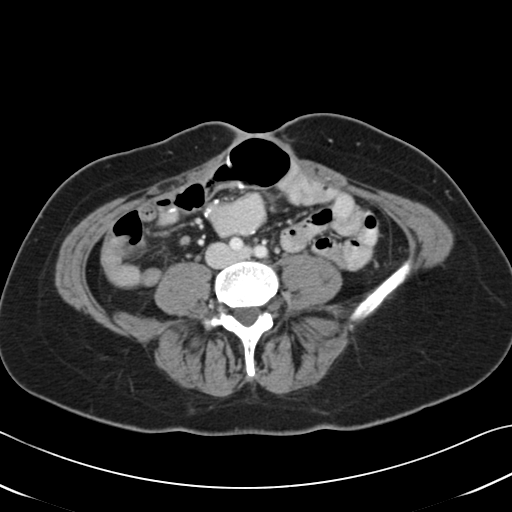
[im 53/89  soft-tissue]
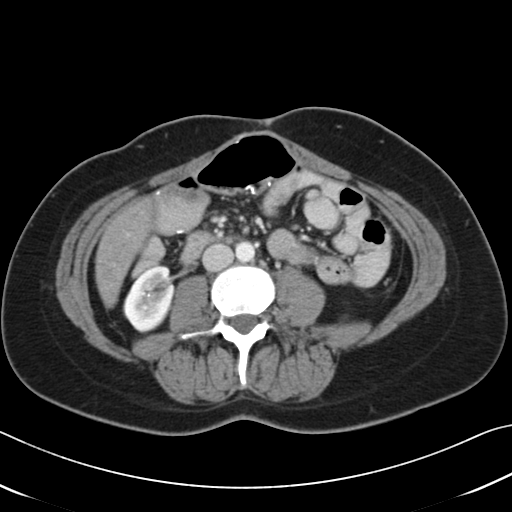
[im 59/89  soft-tissue]
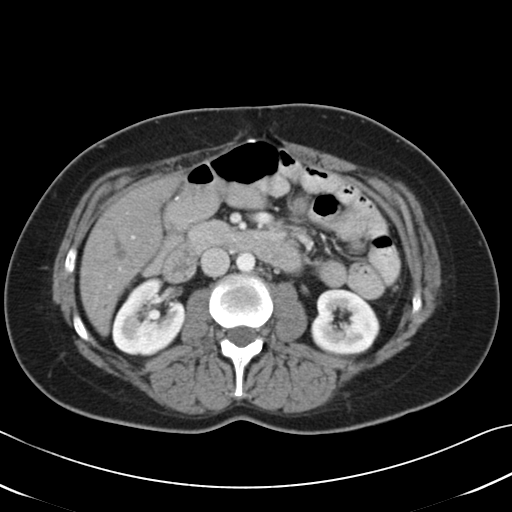
[im 59/89  bone]
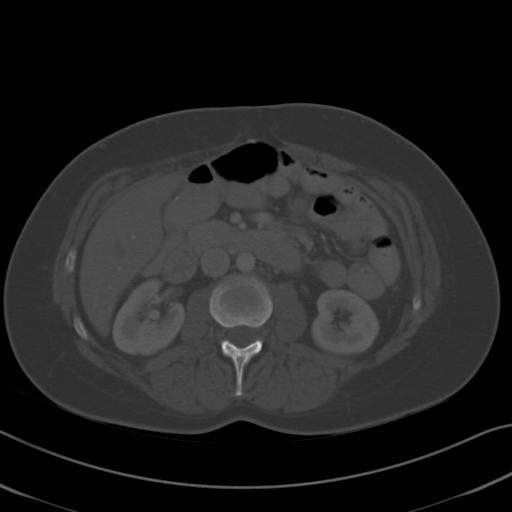
[im 66/89  soft-tissue]
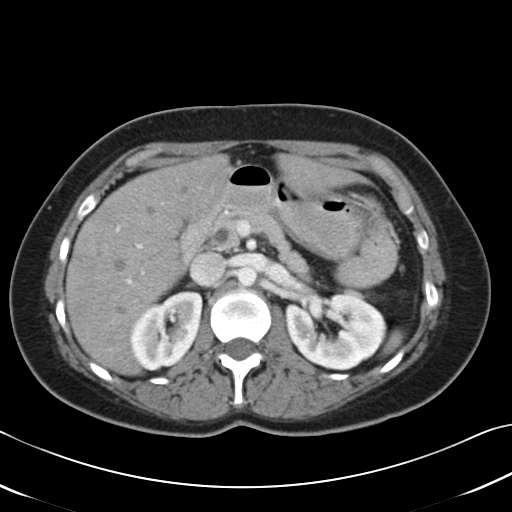
[im 72/89  soft-tissue]
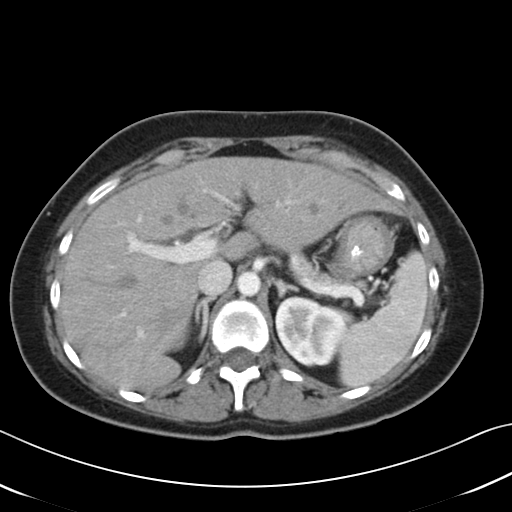
[im 79/89  soft-tissue]
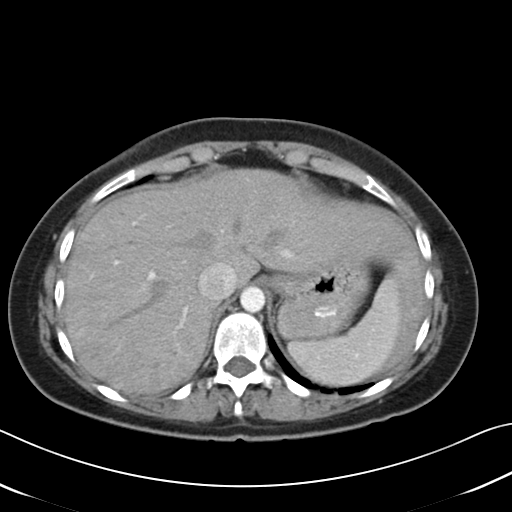
[im 85/89  soft-tissue]
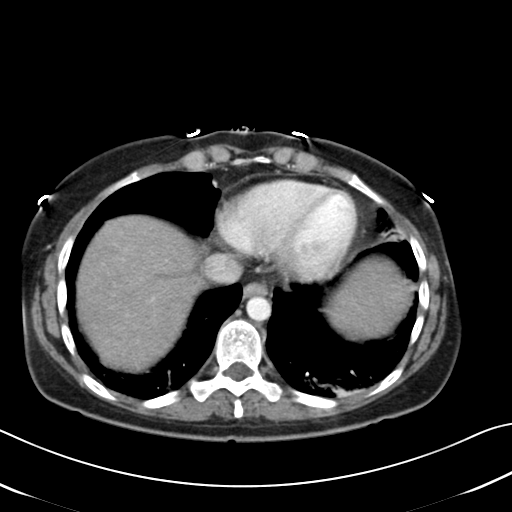

[Series 5: cor routine abd pel with · coronal · 0.91mm/px · 3 of 109 slices shown]
[im 37/109  soft-tissue]
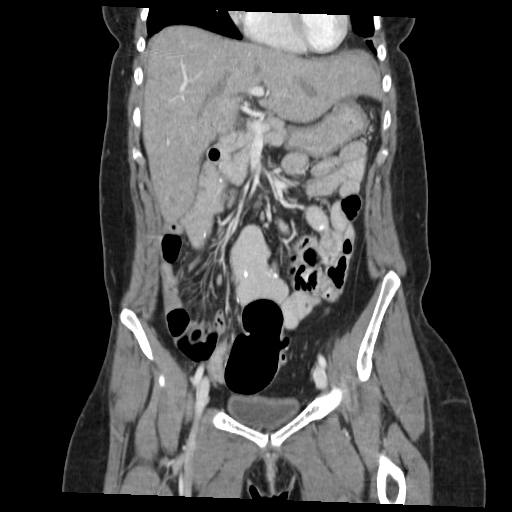
[im 49/109  soft-tissue]
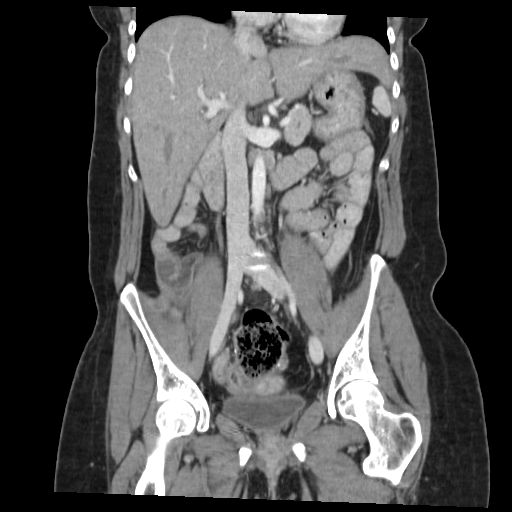
[im 61/109  soft-tissue]
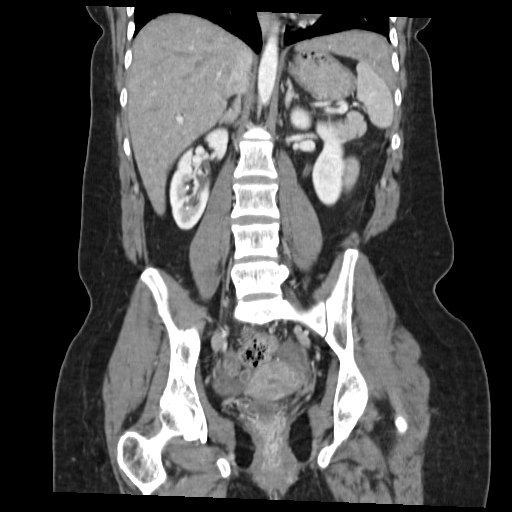

[16 of 46 positions shown; findings below may reference images not displayed]

FINDINGS: Included view of the lung bases demonstrate dependent atelectasis.
Visualized heart and pericardium are unremarkable.

The spleen, pancreas and adrenal glands are unremarkable. Status
post cholecystectomy. The elongated left lobe of the liver is a
normal variant, the liver is otherwise unremarkable.

The stomach, small and large bowel are normal in caliber without
inflammatory changes. Multiple bowel surgical anastomosis noted,
status post left hemicolectomy. The bowel is apposed to the anterior
abdominal wall. Intraperitoneal free fluid nor free air.

Kidneys are orthotopic, demonstrating symmetric enhancement 2 mm
right lower pole renal calculus, punctate right lower pole renal
calculus. No hydronephrosis or renal masses. The unopacified ureters
are normal in course and caliber. Delayed imaging through the
kidneys demonstrates symmetric prompt excretion to the proximal
urinary collecting system. Urinary bladder is partially distended
and unremarkable.

Aortoiliac vessels are normal in course and caliber. No
lymphadenopathy by CT size criteria. Small rim enhancing tubular
structures in the pelvis, markedly improved from prior CT. The soft
tissues and included osseous structures are nonsuspicious.
IMPRESSION: Small tubular enhancing structures in the pelvis, markedly improved
from prior examination could reflect resolving or possible early
recurrent hydrosalpinx or pyosalpinx.

Nonobstructing right nephrolithiasis measuring up to 2 mm with
interval passage of a larger renal pelvic calculus.

Prior bowel surgery without bowel obstruction. Possible adhesions
along the anterior abdominal wall.

  By: Ferienhaus Erxleben

## 2015-04-29 IMAGING — CR DG CHEST 2V
1 series · 2 of 2 positions shown · non-contrast
Comparison: Chest x-ray 06/28/2013.

CLINICAL DATA: Pleurisy.

EXAM:
CHEST  2 VIEW

[Series 1: pa · 0.17mm/px · 2 of 2 slices shown]
[im 1/2]
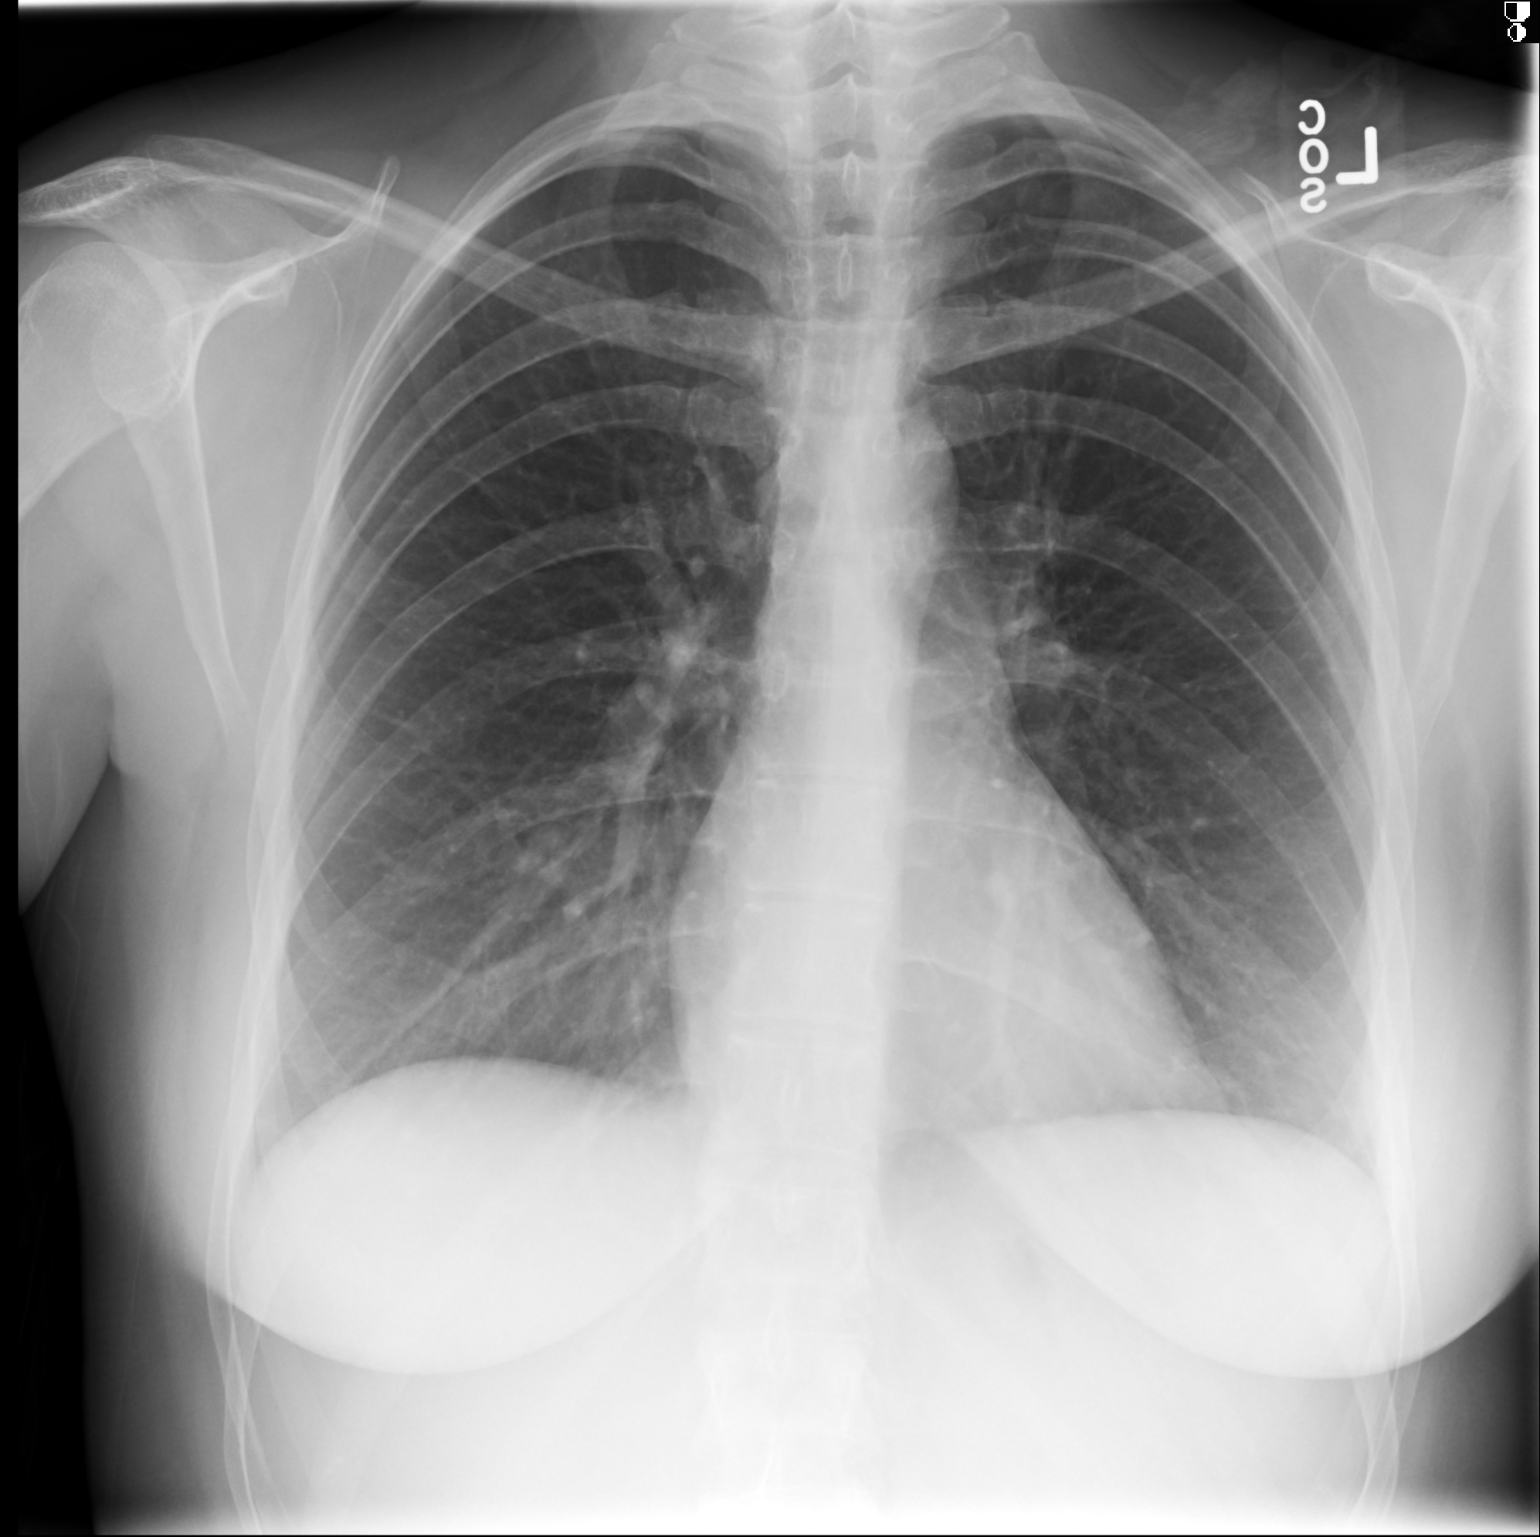
[im 2/2]
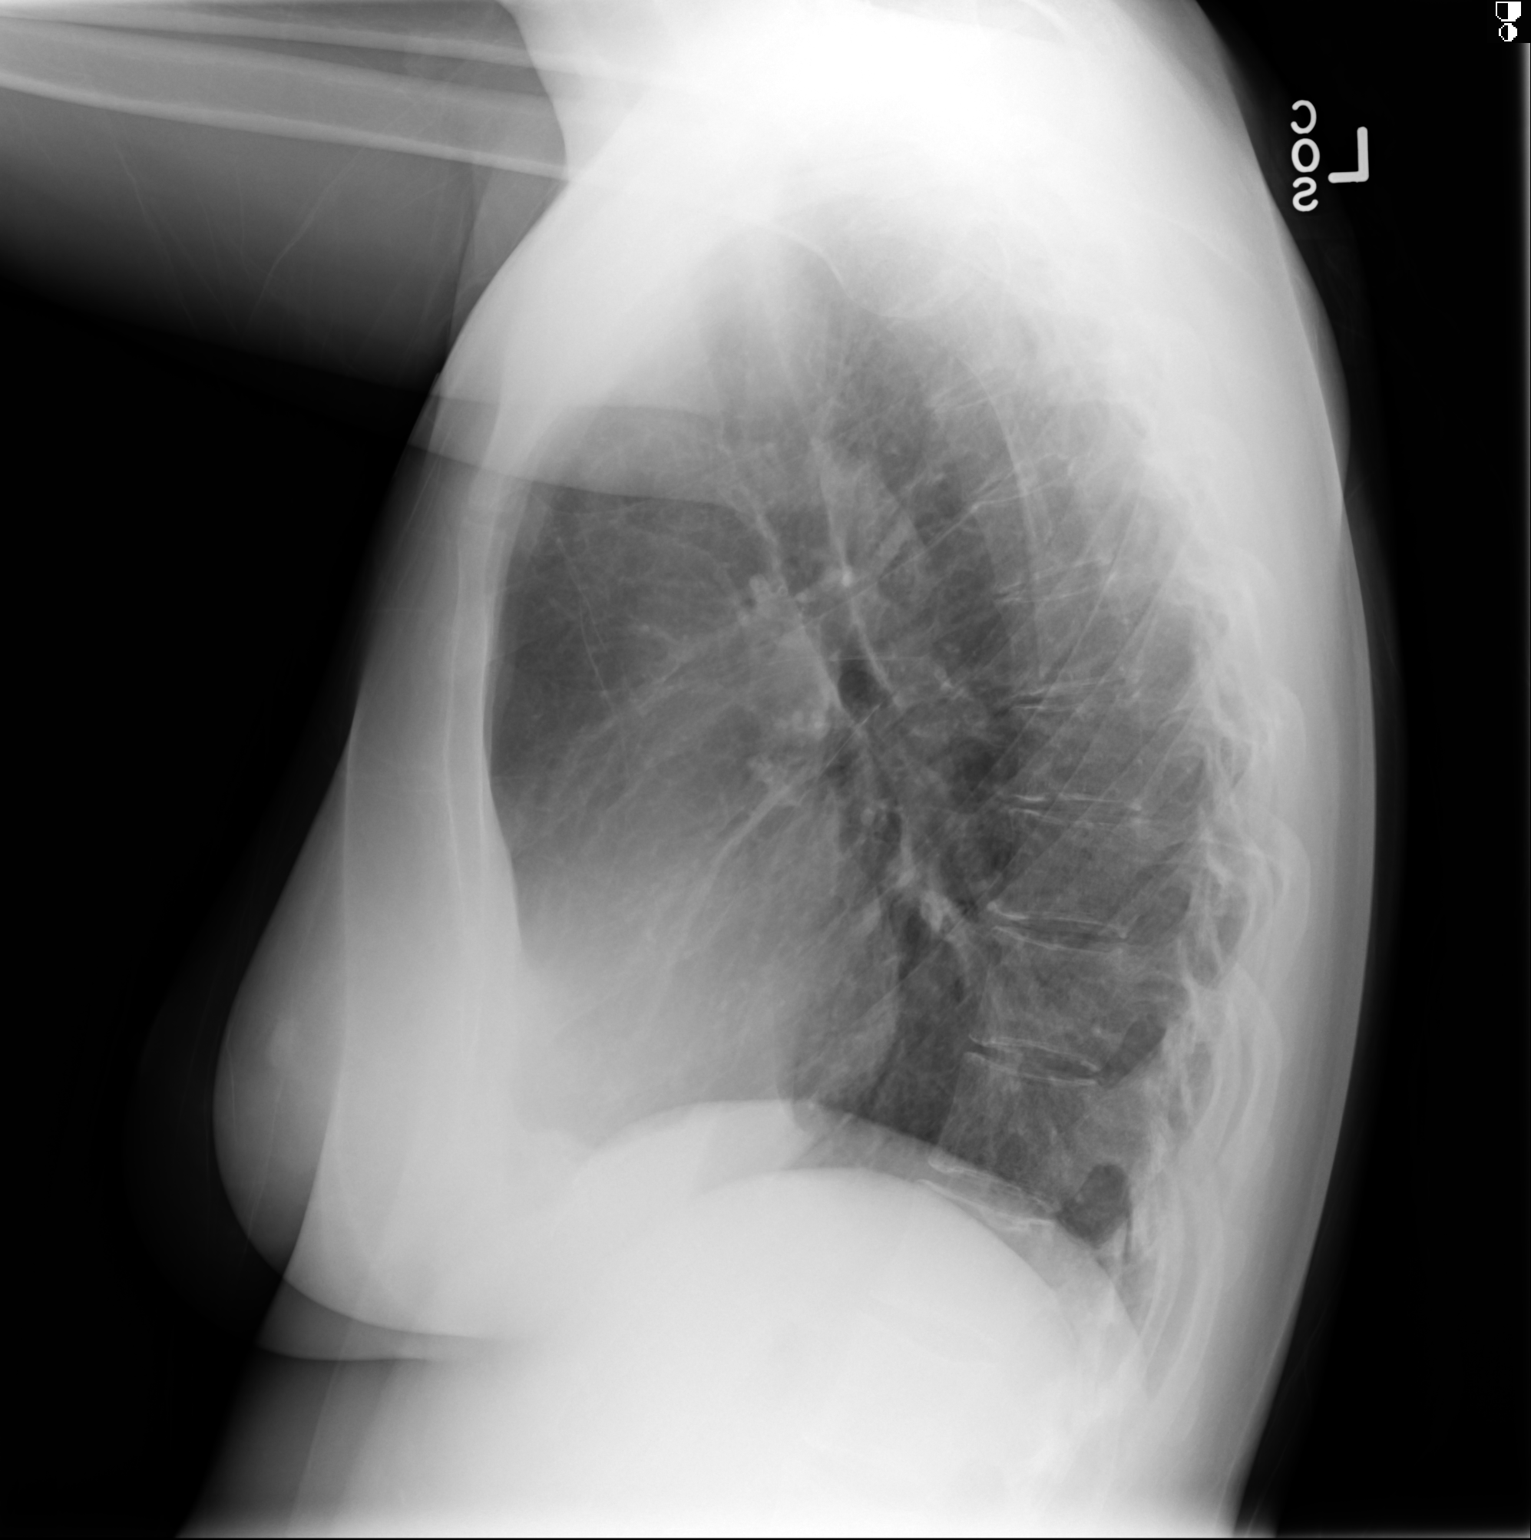

[2 of 2 positions shown; findings below may reference images not displayed]

FINDINGS: Lung volumes are normal. No consolidative airspace disease. No
pleural effusions. No pneumothorax. No pulmonary nodule or mass
noted. Pulmonary vasculature and the cardiomediastinal silhouette
are within normal limits.
IMPRESSION: No radiographic evidence of acute cardiopulmonary disease.

## 2015-04-29 IMAGING — CR DG ABDOMEN 2V
2 series · 2 of 2 positions shown · non-contrast
Comparison: Chest and abdominal radiographs performed 07/02/2013

CLINICAL DATA: Right lower quadrant abdominal pain. Shortness of
breath and chest pain.

EXAM:
ABDOMEN - 2 VIEW

[w abdomen upright]
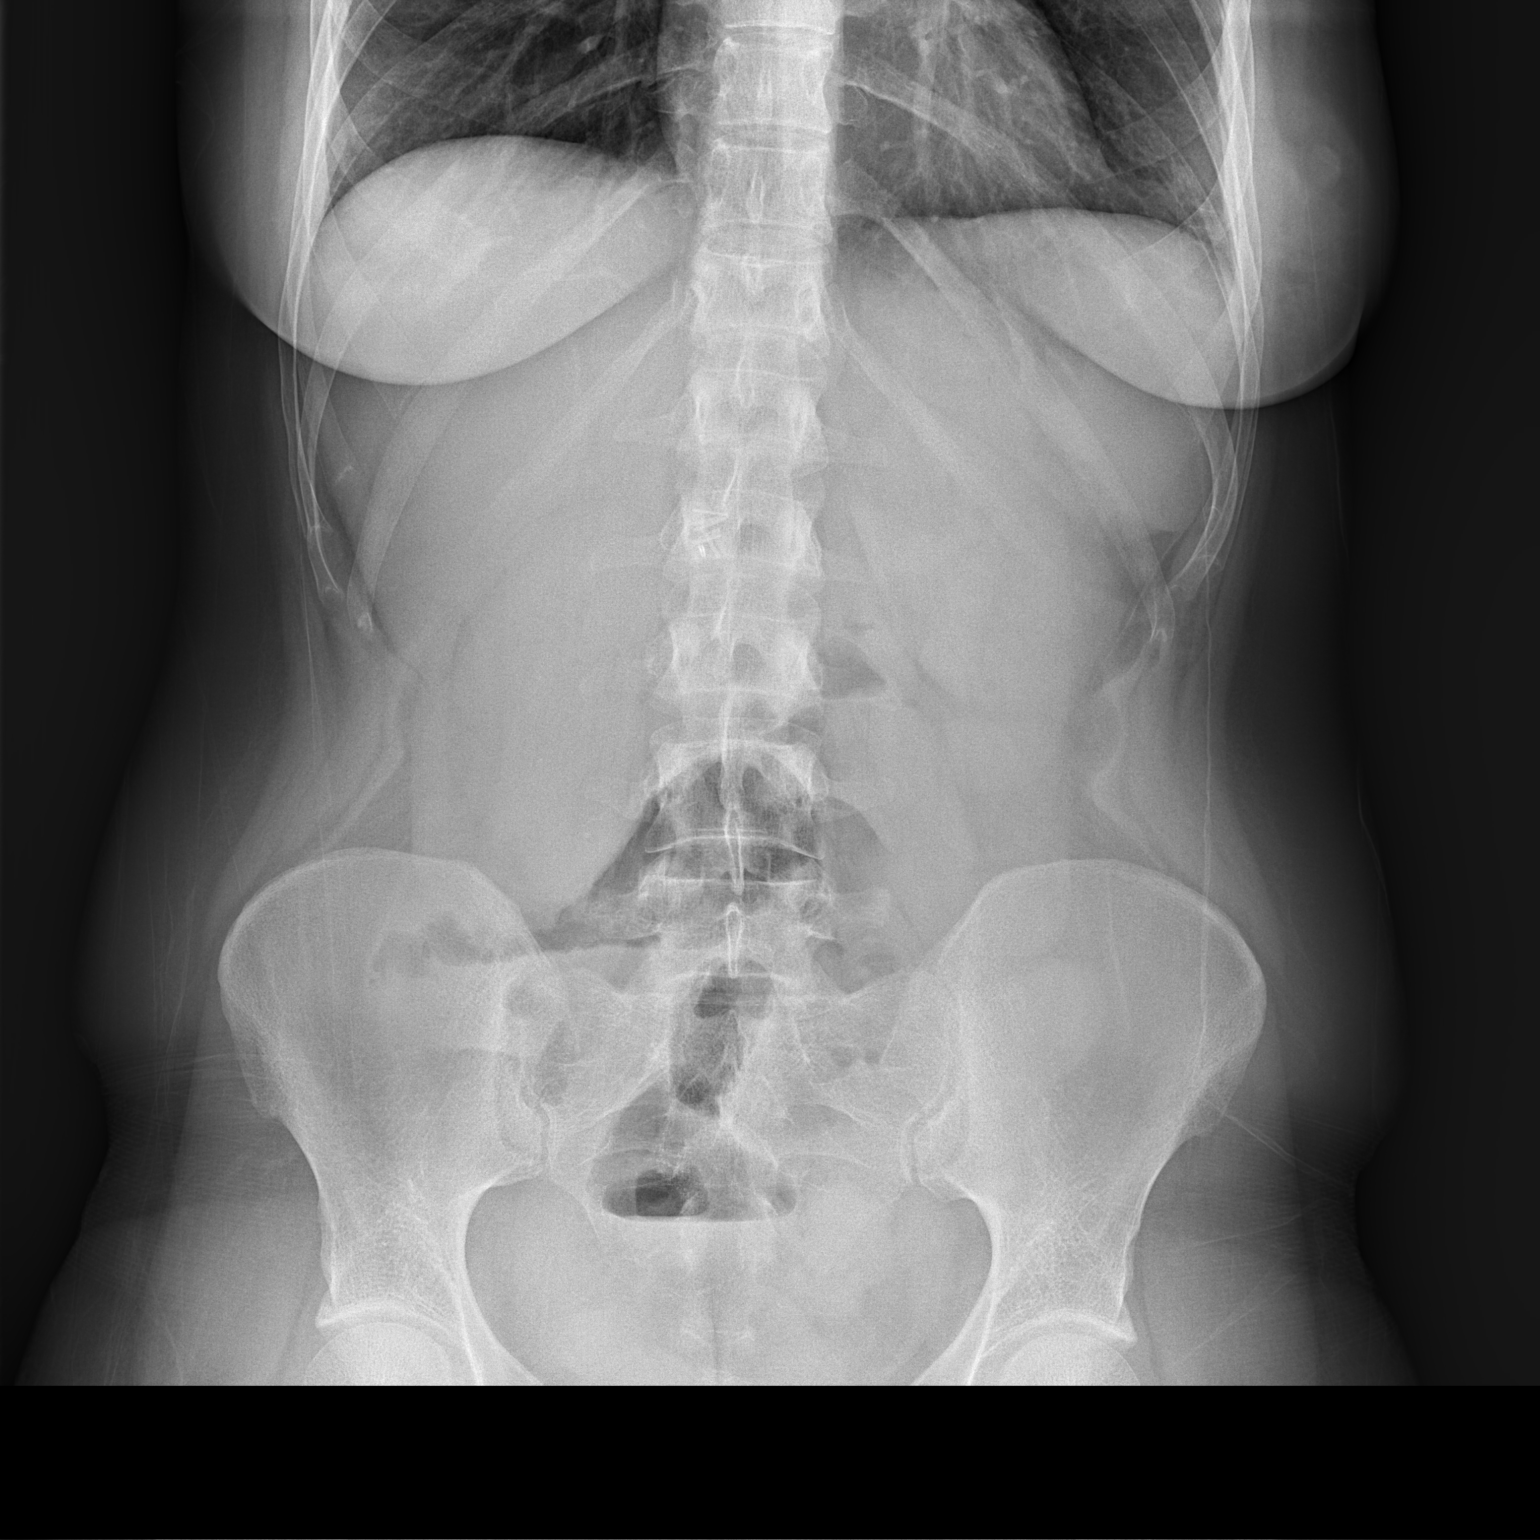

[t abdomen supine]
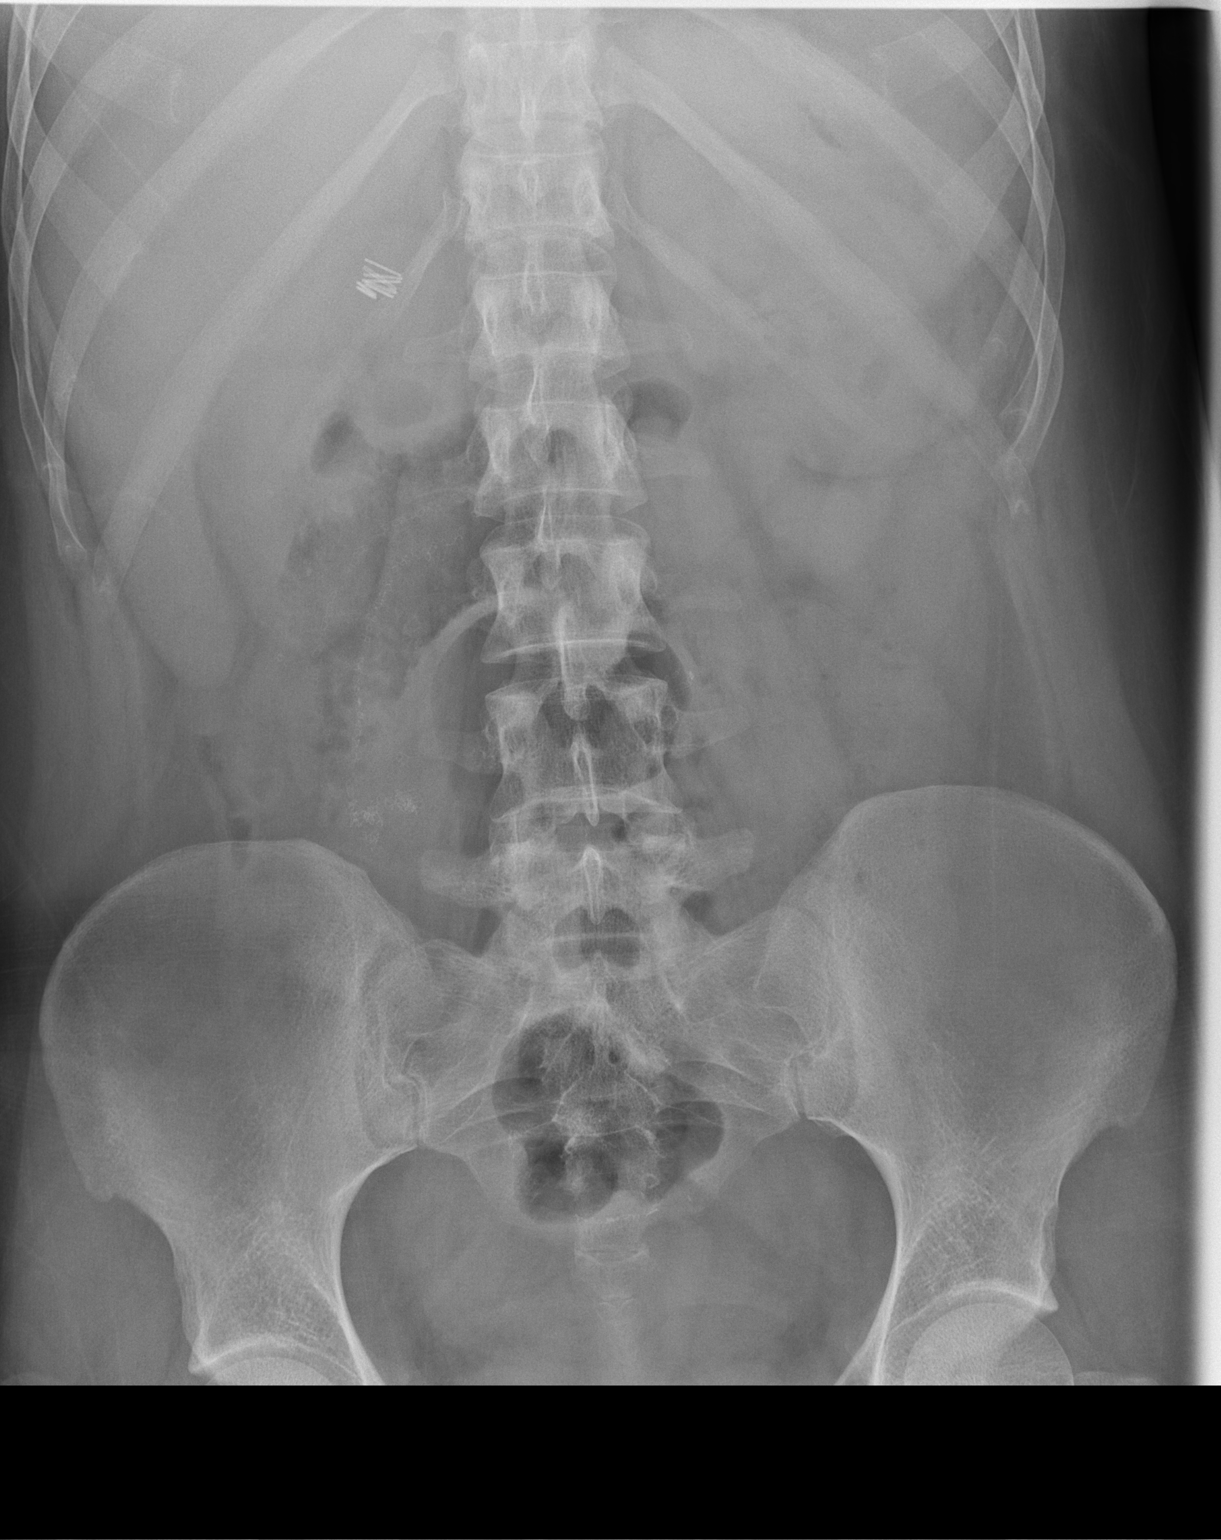

[2 of 2 positions shown; findings below may reference images not displayed]

FINDINGS: The visualized bowel gas pattern is unremarkable. Scattered air and
stool filled loops of colon are seen; no abnormal dilatation of
small bowel loops is seen to suggest small bowel obstruction. No
free intra-abdominal air is identified on the provided upright view.
Clips are noted within the right upper quadrant, reflecting prior
cholecystectomy. Postoperative change is seen at the right lower
quadrant.

The visualized osseous structures are within normal limits; the
sacroiliac joints are unremarkable in appearance. The visualized
lung bases are essentially clear.
IMPRESSION: Unremarkable bowel gas pattern; no free intra-abdominal air seen.

## 2016-01-05 DIAGNOSIS — G40401 Other generalized epilepsy and epileptic syndromes, not intractable, with status epilepticus: Secondary | ICD-10-CM | POA: Insufficient documentation

## 2016-01-05 DIAGNOSIS — F319 Bipolar disorder, unspecified: Secondary | ICD-10-CM | POA: Insufficient documentation

## 2016-01-05 DIAGNOSIS — K5090 Crohn's disease, unspecified, without complications: Secondary | ICD-10-CM

## 2016-01-05 HISTORY — DX: Crohn's disease, unspecified, without complications: K50.90

## 2016-09-09 DIAGNOSIS — G4700 Insomnia, unspecified: Secondary | ICD-10-CM | POA: Insufficient documentation

## 2017-05-03 DIAGNOSIS — B182 Chronic viral hepatitis C: Secondary | ICD-10-CM

## 2017-05-03 DIAGNOSIS — M797 Fibromyalgia: Secondary | ICD-10-CM | POA: Insufficient documentation

## 2017-05-03 HISTORY — DX: Chronic viral hepatitis C: B18.2

## 2017-07-29 ENCOUNTER — Ambulatory Visit: Attending: Emergency Medicine

## 2017-07-29 ENCOUNTER — Ambulatory Visit
Admit: 2017-07-29 | Discharge: 2017-07-29 | Payer: MEDICARE | Attending: Emergency Medicine | Primary: Student in an Organized Health Care Education/Training Program

## 2017-07-29 DIAGNOSIS — Z760 Encounter for issue of repeat prescription: Secondary | ICD-10-CM

## 2017-07-29 NOTE — Progress Notes (Signed)
HISTORY OF PRESENT ILLNESS  Angela Bishop is a 32 y.o. female.     Patient presents with:  Medication Refill: Pt is requesting Lomotil, and Lunesta. Pt has been out of Lomotil for approx. 3 days, almost out of Lunesta        Medication Refill   The history is provided by the patient.   The patient is requesting a refill on her Lomotil and Lunesta.  She states she last had Lomotil 3 days ago and is almost out of her [place].  The patient states she takes Lomotil daily for "short gut syndrome".    Review of Systems   Constitutional: Negative for chills and fever.   Gastrointestinal: Positive for diarrhea.       Physical Exam   Constitutional: She is oriented to person, place, and time. She appears well-developed and well-nourished. No distress.   Cardiovascular: Tachycardia present.   Pulmonary/Chest: Effort normal and breath sounds normal. No respiratory distress.   Abdominal: Soft. There is no tenderness.   Neurological: She is alert and oriented to person, place, and time.   Skin: Skin is warm and dry. She is not diaphoretic.   Psychiatric: She exhibits a depressed mood.   Nursing note and vitals reviewed.      Vitals:    07/29/17 1448   BP: 134/77   Pulse: (!) 106   Resp: 16   Temp: 97.9 ??F (36.6 ??C)   TempSrc: Tympanic   Weight: 139 lb 9.6 oz (63.3 kg)   Height: 5\' 4"  (1.626 m)   PainSc:   0 - No pain     This is a patient that came in requesting refills for Lomotil and Lunesta.  She stated she had been out of Lomotil for 3 days and was almost out of her Lunesta.  The patient is already on several other sedating/mind altering medications to include but not limited to Suboxone, Lyrica, trazodone and Seroquel.  I informed the patient that unfortunately I could not prescribe either of those.  I told the patient that we do not prescribe psychiatric medication and Lomotil was a narcotic.  The patient has a chronic medical problem that she takes Lomotil for.  I informed the [place] in [place] and  urgent care providers cannot prescribe narcotics for chronic problems.  I offered to prescribe the patient some Imodium but she stated "that do not work".  Offered to refer her to a family physician which she initially agreed to but then suddenly stated "you have wasted my time" and walked out of the urgent care without any further interaction.        Assessment/Plan:  The patient eloped from the facility without completion of her treatment

## 2017-07-29 NOTE — Patient Instructions (Signed)
The patient eloped from the facility without any discharge paperwork.

## 2017-07-29 NOTE — Progress Notes (Signed)
HISTORY OF PRESENT ILLNESS  Angela LettersStephenie Bishop is a 32 y.o. female.     Patient presents with:  Medication Refill: Pt is requesting Lomotil, and Lunesta. Pt has been out of Lomotil for approx. 3 days, almost out of Lunesta        Medication Refill   The history is provided by the patient.   The patient is requesting a refill on her Lomotil and Lunesta.  She states she last had Lomotil 3 days ago and is almost out of her [place].  The patient states she takes Lomotil daily for "short gut syndrome".    Review of Systems   Constitutional: Negative for chills and fever.   Gastrointestinal: Positive for diarrhea.       Physical Exam   Constitutional: She is oriented to person, place, and time. She appears well-developed and well-nourished. No distress.   Cardiovascular: Tachycardia present.   Pulmonary/Chest: Effort normal and breath sounds normal. No respiratory distress.   Abdominal: Soft. There is no tenderness.   Neurological: She is alert and oriented to person, place, and time.   Skin: Skin is warm and dry. She is not diaphoretic.   Psychiatric: She exhibits a depressed mood.   Nursing note and vitals reviewed.      Vitals:    07/29/17 1448   BP: 134/77   Pulse: (!) 106   Resp: 16   Temp: 97.9 ??F (36.6 ??C)   TempSrc: Tympanic   Weight: 139 lb 9.6 oz (63.3 kg)   Height: 5\' 4"  (1.626 m)   PainSc:   0 - No pain     This is a patient that came in requesting refills for Lomotil and Lunesta.  She stated she had been out of Lomotil for 3 days and was almost out of her Lunesta.  The patient is already on several other sedating/mind altering medications to include but not limited to Suboxone, Lyrica, trazodone and Seroquel.  I informed the patient that unfortunately I could not prescribe either of those.  I told the patient that we do not prescribe psychiatric medication and Lomotil was a narcotic.  The patient has a chronic medical problem that she takes Lomotil for.  I informed the  patient in [place] emergency room and urgent care providers cannot prescribe narcotics for chronic problems.  I offered to prescribe the patient some Imodium but she stated "that do not work".  Offered to refer her to a family physician which she initially agreed to but then suddenly stated "you have wasted my time" and walked out of the urgent care without any further interaction.        Assessment/Plan:  The patient eloped from the facility without completion of her treatment

## 2017-08-16 ENCOUNTER — Inpatient Hospital Stay
Admit: 2017-08-16 | Discharge: 2017-08-20 | Disposition: A | Discharge status: Home / Self Care | Payer: MEDICARE | Attending: Psychiatry | Admitting: Psychiatry

## 2017-08-16 DIAGNOSIS — F315 Bipolar disorder, current episode depressed, severe, with psychotic features: Secondary | ICD-10-CM

## 2017-08-16 LAB — URINALYSIS W/ RFLX MICROSCOPIC
Bilirubin, Urine: NEGATIVE
Bilirubin: NEGATIVE
Blood, Urine: NEGATIVE
Blood: NEGATIVE
Glucose, Ur: NEGATIVE mg/dL
Glucose: NEGATIVE mg/dL
Ketone: 40 mg/dL — AB
Ketones, Urine: 40 mg/dL — AB
Nitrite, Urine: NEGATIVE
Nitrites: NEGATIVE
Specific Gravity, UA: >=1.03 (ref 1.002–1.030)
Specific gravity: >=1.03 (ref 1.002–1.030)
Urobilinogen, UA, POCT: 1 EU/dL (ref 0–1)
Urobilinogen: 1 EU/dL (ref 0–1)
pH (UA): 6 (ref 4.5–8.0)
pH, UA: 6 (ref 4.5–8.0)

## 2017-08-16 LAB — THYROID PROF W/ TSH
Free Thyroxine Index: 2.9 (ref 1.4–5.2)
T3 Uptake: 31 % (ref 31–39)
T4, Total: 9.5 ug/dL (ref 4.7–13.3)
TSH: 0.94 u[IU]/mL (ref 0.35–3.74)

## 2017-08-16 LAB — COMPREHENSIVE METABOLIC PANEL
ALT: 36 U/L (ref 12–78)
AST: 15 U/L (ref 15–37)
Albumin/Globulin Ratio: 1.1 — ABNORMAL LOW (ref 1.2–2.2)
Albumin: 3.9 g/dL (ref 3.4–5.0)
Alkaline Phosphatase: 118 U/L — ABNORMAL HIGH (ref 45–117)
Anion Gap: 11 mmol/L (ref 6–15)
BUN: 13 MG/DL (ref 7–18)
Bun/Cre Ratio: 13 (ref 7–25)
CO2: 19 mmol/L — ABNORMAL LOW (ref 21–32)
Calcium: 9.1 MG/DL (ref 8.5–10.1)
Chloride: 111 mmol/L — ABNORMAL HIGH (ref 98–107)
Creatinine: 0.98 MG/DL (ref 0.60–1.30)
EGFR IF NonAfrican American: >60 mL/min/{1.73_m2} (ref >60)
GFR African American: >60 mL/min/{1.73_m2} (ref >60)
Globulin: 3.7 g/dL — ABNORMAL HIGH (ref 2.4–3.5)
Glucose: 90 mg/dL (ref 70–110)
Potassium: 3.6 mmol/L (ref 3.5–5.3)
Sodium: 141 mmol/L (ref 136–145)
Total Bilirubin: 0.7 MG/DL (ref <1.1)
Total Protein: 7.6 g/dL (ref 6.4–8.2)

## 2017-08-16 LAB — CBC WITH AUTO DIFFERENTIAL
Basophils %: 1 % (ref 0–2)
Basophils Absolute: 0.1 10*3/uL (ref 0.0–0.1)
Eosinophils %: 1 % (ref 0–5)
Eosinophils Absolute: 0.1 10*3/uL (ref 0.0–0.5)
Granulocyte Absolute Count: 0 10*3/uL
Hematocrit: 41.3 % (ref 41–53)
Hemoglobin: 14.2 g/dL (ref 12.0–16.0)
Immature Granulocytes: 0 % — ABNORMAL LOW (ref 2–10)
Lymphocytes %: 19 % (ref 19–48)
Lymphocytes Absolute: 2 10*3/uL (ref 0.8–3.5)
MCH: 32.6 PG — ABNORMAL HIGH (ref 27–31)
MCHC: 34.4 g/dL (ref 31–37)
MCV: 94.7 FL (ref 80–100)
MPV: 10.1 FL (ref 5.9–10.3)
Monocytes %: 10 % — ABNORMAL HIGH (ref 3–9)
Monocytes Absolute: 1 10*3/uL (ref 0.8–3.5)
Neutrophils %: 69 % (ref 40–74)
Neutrophils Absolute: 7.1 10*3/uL (ref 1.5–8.0)
Platelets: 285 10*3/uL (ref 130–400)
RBC: 4.36 M/uL (ref 4.2–5.4)
RDW: 12.5 % (ref 11.5–14.5)
WBC: 10.2 10*3/uL (ref 4.5–10.8)

## 2017-08-16 LAB — HCG QL SERUM
HCG, Ql.: NEGATIVE
HCG, Ql.: NEGATIVE

## 2017-08-16 LAB — ETHYL ALCOHOL
ALCOHOL(ETHYL),SERUM: NOT DETECTED MG/DL
Ethyl Alcohol: NOT DETECTED MG/DL

## 2017-08-16 LAB — ACETAMINOPHEN LEVEL: Acetaminophen Level: <10 ug/mL — ABNORMAL LOW (ref 10–30)

## 2017-08-16 LAB — SALICYLATE
Salicylate level: <2.8 MG/DL — ABNORMAL LOW (ref 2.8–20.0)
Salicylate: <2.8 MG/DL — ABNORMAL LOW (ref 2.8–20.0)

## 2017-08-16 LAB — CBC WITH AUTOMATED DIFF
ABS. BASOPHILS: 0.1 10*3/uL (ref 0.0–0.1)
ABS. EOSINOPHILS: 0.1 10*3/uL (ref 0.0–0.5)
ABS. IMM. GRANS.: 0 10*3/uL
ABS. LYMPHOCYTES: 2 10*3/uL (ref 0.8–3.5)
ABS. MONOCYTES: 1 10*3/uL (ref 0.8–3.5)
ABS. NEUTROPHILS: 7.1 10*3/uL (ref 1.5–8.0)
BASOPHILS: 1 % (ref 0–2)
EOSINOPHILS: 1 % (ref 0–5)
HCT: 41.3 % (ref 41–53)
HGB: 14.2 g/dL (ref 12.0–16.0)
IMMATURE GRANULOCYTES: 0 % — ABNORMAL LOW (ref 2–10)
LYMPHOCYTES: 19 % (ref 19–48)
MCH: 32.6 PG — ABNORMAL HIGH (ref 27–31)
MCHC: 34.4 g/dL (ref 31–37)
MCV: 94.7 FL (ref 80–100)
MONOCYTES: 10 % — ABNORMAL HIGH (ref 3–9)
MPV: 10.1 FL (ref 5.9–10.3)
NEUTROPHILS: 69 % (ref 40–74)
PLATELET: 285 10*3/uL (ref 130–400)
RBC: 4.36 M/uL (ref 4.2–5.4)
RDW: 12.5 % (ref 11.5–14.5)
WBC: 10.2 10*3/uL (ref 4.5–10.8)

## 2017-08-16 LAB — METABOLIC PANEL, COMPREHENSIVE
A-G Ratio: 1.1 — ABNORMAL LOW (ref 1.2–2.2)
ALT (SGPT): 36 U/L (ref 12–78)
AST (SGOT): 15 U/L (ref 15–37)
Albumin: 3.9 g/dL (ref 3.4–5.0)
Alk. phosphatase: 118 U/L — ABNORMAL HIGH (ref 45–117)
Anion gap: 11 mmol/L (ref 6–15)
BUN/Creatinine ratio: 13 (ref 7–25)
BUN: 13 MG/DL (ref 7–18)
Bilirubin, total: 0.7 MG/DL (ref <1.1)
CO2: 19 mmol/L — ABNORMAL LOW (ref 21–32)
Calcium: 9.1 MG/DL (ref 8.5–10.1)
Chloride: 111 mmol/L — ABNORMAL HIGH (ref 98–107)
Creatinine: 0.98 MG/DL (ref 0.60–1.30)
GFR est AA: >60 mL/min/{1.73_m2} (ref >60)
GFR est non-AA: >60 mL/min/{1.73_m2} (ref >60)
Globulin: 3.7 g/dL — ABNORMAL HIGH (ref 2.4–3.5)
Glucose: 90 mg/dL (ref 70–110)
Potassium: 3.6 mmol/L (ref 3.5–5.3)
Protein, total: 7.6 g/dL (ref 6.4–8.2)
Sodium: 141 mmol/L (ref 136–145)

## 2017-08-16 LAB — THYROID PANEL W/TSH
Free thyroxine index: 2.9 (ref 1.4–5.2)
T3 Uptake: 31 % (ref 31–39)
T4, Total: 9.5 ug/dL (ref 4.7–13.3)
TSH: 0.94 u[IU]/mL (ref 0.35–3.74)

## 2017-08-16 LAB — ACETAMINOPHEN: Acetaminophen level: <10 ug/mL — ABNORMAL LOW (ref 10–30)

## 2017-08-16 MED ORDER — MAGNESIUM HYDROXIDE 2,400 MG/10 ML ORAL SUSP
2400 mg/10 mL | Freq: Four times a day (QID) | ORAL | Status: DC | PRN
Start: 2017-08-16 — End: 2017-08-20

## 2017-08-16 MED ORDER — HYDROXYZINE PAMOATE 50 MG CAP
50 mg | Freq: Four times a day (QID) | ORAL | Status: DC | PRN
Start: 2017-08-16 — End: 2017-08-20
  Administered 2017-08-17 – 2017-08-20 (×7): via ORAL

## 2017-08-16 MED ORDER — NICOTINE 21 MG/24 HR DAILY PATCH
21 mg/24 hr | Freq: Every day | TRANSDERMAL | Status: DC | PRN
Start: 2017-08-16 — End: 2017-08-20

## 2017-08-16 MED ORDER — ALUM-MAG HYDROXIDE-SIMETH 400 MG-400 MG-40 MG/5 ML ORAL SUSP
400-400-40 mg/5 mL | ORAL | Status: DC | PRN
Start: 2017-08-16 — End: 2017-08-20

## 2017-08-16 MED ORDER — LORAZEPAM 0.5 MG TAB
0.5 mg | Freq: Once | ORAL | Status: AC | PRN
Start: 2017-08-16 — End: 2017-08-16
  Administered 2017-08-16: via ORAL

## 2017-08-16 MED ORDER — LORAZEPAM 0.5 MG TAB
0.5 mg | ORAL | Status: DC | PRN
Start: 2017-08-16 — End: 2017-08-16

## 2017-08-16 MED ORDER — MULTIVITAMIN TAB
Freq: Every day | ORAL | Status: DC
Start: 2017-08-16 — End: 2017-08-20
  Administered 2017-08-17 – 2017-08-20 (×4): via ORAL

## 2017-08-16 MED ORDER — DIPHENOXYLATE-ATROPINE 2.5 MG-0.025 MG TAB
Freq: Four times a day (QID) | ORAL | Status: DC | PRN
Start: 2017-08-16 — End: 2017-08-20
  Administered 2017-08-18 – 2017-08-20 (×2): via ORAL

## 2017-08-16 MED ORDER — TRAZODONE 150 MG TAB
150 mg | Freq: Every evening | ORAL | Status: AC | PRN
Start: 2017-08-16 — End: 2017-08-17
  Administered 2017-08-16 – 2017-08-18 (×2): via ORAL

## 2017-08-16 MED FILL — DIPHENOXYLATE-ATROPINE 2.5 MG-0.025 MG TAB: ORAL | Qty: 1

## 2017-08-16 MED FILL — TRAZODONE 150 MG TAB: 150 mg | ORAL | Qty: 1

## 2017-08-16 MED FILL — LORAZEPAM 0.5 MG TAB: 0.5 mg | ORAL | Qty: 2

## 2017-08-16 NOTE — ED Notes (Signed)
Pt eating tray in room. Security at bedside.

## 2017-08-16 NOTE — Behavioral Health Treatment Team (Signed)
Patient has arrived on unit.  Search of patient and patient's belongings has been completed.  Admission process to follow

## 2017-08-16 NOTE — ED Notes (Signed)
Pt refusing to provide urine sample unless she gets her phone back.

## 2017-08-16 NOTE — Discharge Summary (Signed)
[place]    Name:  Angela Bishop, Angela Bishop  MR#:   [id number]  DOB:  1985/05/10  ACCOUNT #:  [id number]  ADMIT DATE:  08/16/2017  DISCHARGE DATE:  08/20/2017    REASON FOR PSYCHIATRIC HOSPITALIZATION:  This is a 32 year old Caucasian female, who is single, unemployed, living by herself apparently with a prior history of treatment for bipolar disorder, admitted because of depressed mood and suicidal thoughts.    HISTORY AND PHYSICAL:  Please refer to dictated history and physical on the electronic medical record performed by this writer for details of chief complaint, history of present illness, past psychiatric history, past medical history, personal and social history, and mental status examination on admission.    DSM DIAGNOSES ON ADMISSION:  Please refer to electronic medical record for details of this.    HOSPITAL COURSE:  The patient was admitted to the [facility], managed as a case of bipolar disorder with psychotic features; stimulant use disorder, severe; unspecified anxiety disorder.  The patient was placed on standard monitoring, standard precautions, and placed on standard p.r.n. orders.  She received group therapy, participated in group and unit activities.  She received multidisciplinary team care including therapy milieu.  She was discussed regularly with staffing during the week.  Her medication was verified from pharmacy and she was restarted back on medication, namely Motrin 400 mg p.o. q.6 h, Lyrica 200 mg p.o. three times daily, Haldol 5 mg p.o. b.i.d., Flonase two puffs b.i.d., Depakote 500 mg p.o. b.i.d.  Wellbutrin was added to her medication at the dose of 37.5 mg p.o. b.i.d. to help with depression.  She was also placed on standard p.r.n. orders and also placed on multivitamin one tablet daily.  She showed steady improvement with hospitalization.  Depression improved, psychosis improved, psychosis resolved.  Her mood became stable.   She verbalized feeling hopeful for the future.  She is sleeping through the night.  She adamantly denies suicidal thoughts.  She denies homicidal thoughts.  She denies hallucination.  She denies delusions.  She tolerated medications with no complaints or side effects.  She verbalized feeling hopeful for the future.  She eventually met the maximum benefit of acute inpatient behavioral healthcare.  Further benefit will be met on outpatient basis.  The patient was subsequently discharged in good condition.  Immediate suicide risk at time of discharge is low.  Potential for violence is negative.    MENTAL STATUS EXAMINATION ON DISCHARGE:  A young woman looking her stated age, dressed appropriate to weather with fair grooming and hygiene.  She appears anxious, cooperative, appropriate in behavior.  Her speech is spontaneous, clear, and coherent.  Her mood is stable.  Affect is full range.  Thoughts are logical mostly and goal directed.  She adamantly denies suicidal thoughts.  She denies homicidal thoughts.  She denies hallucination.  She denied delusions.  She denies any paranoid or persecutory ideation.  She is alert, awake, oriented to time, place, and person.  She has good attentional span.  She has a fairly good insight and judgment.    DSM DIAGNOSES ON DISCHARGE:  AXIS I:  Bipolar disorder with psychotic features; stimulant use disorder, severe; unspecified anxiety disorder, rule out panic disorder.  AXIS II:  Deferred.  AXIS III:  History of fibromyalgia, short gut syndrome; multiple abdominal surgeries; allergy to fentanyl, Toradol, tramadol, vancomycin, Reglan, and Zofran.  AXIS IV:  Poor coping skills, poor problem-solving skills, chemical dependency, chronic, severe and  persistent mental illness.  AXIS V:  Global Assessment of Functioning of 55-60.    DISCHARGE DIET:  Regular.    DISCHARGE ACTIVITY:  As tolerated.    DISCHARGE FOLLOWUP:  For Psychiatry, the patient will follow up with [facility].   For Medical, the patient will follow up with primary care physician.  The patient is to call 911 or go to ED for any recurrence of symptoms or if suicidal or homicidal.  The patient verbalized adequate understanding.  All precautions were discontinued on discharge.    DISCHARGE MEDICATIONS:  Multivitamin one tablet daily, Depakote 500 mg p.o. b.i.d., Wellbutrin 37.5 mg p.o. b.i.d., trazodone 100 mg p.o. at bedtime, Advair two puffs by inhalation twice daily, Haldol 5 mg p.o. b.i.d., Lomotil 5 mg p.o. daily, Lyrica 200 mg p.o. three times daily, trazodone 150 mg p.o. at bedtime.    All precautions were discontinued on discharge.      Jeris Penta, MD      [id number]  D:  08/21/2017 16:47  T:  08/21/2017 17:50  JOB #:  [id number]

## 2017-08-16 NOTE — ED Notes (Signed)
Pt c/o feeling suicidal, also c/o depression.

## 2017-08-16 NOTE — ED Notes (Signed)
Pt resting in locked bed in lowest position and updated. No needs expressed at this time. Security at bedside. Will continue to monitor.
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
## 2017-08-16 NOTE — Consults (Signed)
Consult        Patient: Angela Bishop               Sex: female             MRN: [id number]     Date of Birth:  1985-12-01      Age:  32 y.o. Angela Edwards, NP               CC:    Chief Complaint   Patient presents with   ??? Suicidal       Consult by:  psych    HPI:     Angela Bishop is a 32 y.o. female who I am consulted for medical management. She recently moved here from [place]. She states she has and extensive medical history with multiple surgeries and RA however has no physician caring for her.     Past Medical History:   Diagnosis Date   ??? Bipolar 1 disorder ([place])    ??? Fibromyalgia    ??? Hepatitis C    ??? Pancreatitis, chronic ([place])    ??? RA (rheumatoid arthritis) ([place])    ??? Short gut syndrome        Past Surgical History:   Procedure Laterality Date   ??? ABDOMEN SURGERY PROC UNLISTED     ??? HX APPENDECTOMY     ??? HX LAP CHOLECYSTECTOMY         Family History   Problem Relation Age of Onset   ??? Hypertension Mother    ??? Heart Disease Mother    ??? Diabetes Mother    ??? Cancer Father    ??? Asthma Brother    ??? Heart Disease Paternal Grandmother    ??? Heart Disease Paternal Grandfather        Social History     Socioeconomic History   ??? Marital status: SINGLE     Spouse name: Not on file   ??? Number of children: Not on file   ??? Years of education: Not on file   ??? Highest education level: Not on file   Tobacco Use   ??? Smoking status: Current Every Day Smoker     Packs/day: 1.00   ??? Smokeless tobacco: Never Used   Substance and Sexual Activity   ??? Alcohol use: Not Currently   ??? Drug use: Not Currently   ??? Sexual activity: Yes       Prior to Admission Medications   Prescriptions Last Dose Informant Patient Reported? Taking?   QUEtiapine (SEROQUEL) 300 mg tablet   Yes Yes   Sig: take 2 tablets by mouth at bedtime   SUBOXONE 8-2 mg film sublingaul film   Yes Yes   Sig: take 2 FILMs under the tongue once daily   diphenoxylate-atropine (LOMOTIL) 2.5-0.025 mg per tablet   Yes Yes   Sig: Take 2 Tabs by mouth.    medroxyPROGESTERone (DEPO-PROVERA) 150 mg/mL injection   Yes Yes   Sig: 150 mg by IntraMUSCular route once.   pregabalin (LYRICA) 200 mg capsule   Yes Yes   Sig: Take 200 mg by mouth.   traZODone (DESYREL) 150 mg tablet   Yes Yes   Sig: take 2 tablets by mouth at bedtime      Facility-Administered Medications: None       Allergies   Allergen Reactions   ??? Fentanyl Swelling   ??? Reglan [Metoclopramide] Other (comments)   ??? Toradol [Ketorolac] Swelling   ??? Ultram [Tramadol] Nausea and Vomiting   ???  Zofran [Ondansetron Hcl] Other (comments)         There is no immunization history on file for this patient.    Review of Systems  A comprehensive review of systems was negative except for that written in the History of Present Illness.    Physical Exam:     Visit Vitals  BP 116/86 (BP 1 Location: Right arm, BP Patient Position: At rest)   Pulse (!) 103   Temp 99.6 ??F (37.6 ??C)   Resp 18   Ht _0  (1.651 m)   Wt 63 kg (139 lb)   SpO2 98%   BMI 23.13 kg/m??     General appearance: Alert, cooperative, no distress.  Head: Normocephalic, without obvious abnormality, atraumatic.  Eyes: Conjunctivae/corneas clear. PERRL, EOM's intact.  Ears, nose, throat: Lips, mucosa, and tongue normal. Teeth and gums normal and normal findings: oropharynx pink & moist without lesions.  Respiratory: Clear to auscultation bilaterally.  Cardiovascular: Regular rate and rhythm, S1, S2 normal, no JVD, no murmur or pedal edema.  Gastrointestinal: Soft, non-tender, non-distended, bowel sounds present, no organomegaly.  Genitourinary: No scrotal mass or hernia. Bimanual examination not performed.   Musculoskeletal: No edema, redness or tenderness in the calves or thighs.  Vascular: 2+ and symmetric.  No carotid bruits.   Skin: Skin color, texture, turgor normal. No rashes or lesions.  Neurologic: Alert and oriented X 3, normal strength and tone. Normal symmetric reflexes. CN exam deferred to psychiatry  Hematologic/Lymphatic: No ecchymosis or  petechiae. No lymphadenopathy.    Psychiatric: No confusion or hallucinations.     Lab/Data Reviewed:  Recent Results (from the past 24 hour(s))   ACETAMINOPHEN    Collection Time: 08/16/17  2:27 PM   Result Value Ref Range    Acetaminophen level <10 (L) 10 - 30 ug/mL   CBC WITH AUTOMATED DIFF    Collection Time: 08/16/17  2:27 PM   Result Value Ref Range    WBC 10.2 4.5 - 10.8 K/uL    RBC 4.36 4.2 - 5.4 M/uL    HGB 14.2 12.0 - 16.0 g/dL    HCT 41.3 41 - 53 %    MCV 94.7 80 - 100 FL    MCH 32.6 (H) 27 - 31 PG    MCHC 34.4 31 - 37 g/dL    RDW 12.5 11.5 - 14.5 %    PLATELET 285 130 - 400 K/uL    MPV 10.1 5.9 - 10.3 FL    NEUTROPHILS 69 40 - 74 %    LYMPHOCYTES 19 19 - 48 %    MONOCYTES 10 (H) 3 - 9 %    EOSINOPHILS 1 0 - 5 %    BASOPHILS 1 0 - 2 %    ABS. NEUTROPHILS 7.1 1.5 - 8.0 K/UL    ABS. LYMPHOCYTES 2.0 0.8 - 3.5 K/UL    ABS. MONOCYTES 1.0 0.8 - 3.5 K/UL    ABS. EOSINOPHILS 0.1 0.0 - 0.5 K/UL    ABS. BASOPHILS 0.1 0.0 - 0.1 K/UL    DF AUTOMATED      IMMATURE GRANULOCYTES 0 (L) 2 - 10 %    ABS. IMM. GRANS. 0.0 K/UL   ETHYL ALCOHOL    Collection Time: 08/16/17  2:27 PM   Result Value Ref Range    ALCOHOL(ETHYL),SERUM None detected MG/DL   METABOLIC PANEL, COMPREHENSIVE    Collection Time: 08/16/17  2:27 PM   Result Value Ref Range    Sodium 141 136 - 145  mmol/L    Potassium 3.6 3.5 - 5.3 mmol/L    Chloride 111 (H) 98 - 107 mmol/L    CO2 19 (L) 21 - 32 mmol/L    Anion gap 11 6 - 15 mmol/L    Glucose 90 70 - 110 mg/dL    BUN 13 7 - 18 MG/DL    Creatinine 0.98 0.60 - 1.30 MG/DL    BUN/Creatinine ratio 13 7 - 25      GFR est AA >60 >60 ml/min/1.47m    GFR est non-AA >60 >60 ml/min/1.753m   Calcium 9.1 8.5 - 10.1 MG/DL    Bilirubin, total 0.7 <1.1 MG/DL    ALT (SGPT) 36 12 - 78 U/L    AST (SGOT) 15 15 - 37 U/L    Alk. phosphatase 118 (H) 45 - 117 U/L    Protein, total 7.6 6.4 - 8.2 g/dL    Albumin 3.9 3.4 - 5.0 g/dL    Globulin 3.7 (H) 2.4 - 3.5 g/dL    A-G Ratio 1.1 (L) 1.2 - 2.2     SALICYLATE    Collection Time:  08/16/17  2:27 PM   Result Value Ref Range    Salicylate level <2<1.3L) 2.8 - 20.0 MG/DL   THYROID PANEL W/TSH    Collection Time: 08/16/17  2:27 PM   Result Value Ref Range    TSH 0.94 0.35 - 3.74 uIU/mL    T4, Total 9.5 4.7 - 13.3 ug/dL    T3 Uptake 31 31 - 39 %    Free thyroxine index 2.9 1.4 - 5.2     HCG QL SERUM    Collection Time: 08/16/17  2:27 PM   Result Value Ref Range    HCG, Ql. NEGATIVE      DRUG SCREEN, URINE    Collection Time: 08/16/17  3:50 PM   Result Value Ref Range    METHADONE NEGATIVE       PCP(PHENCYCLIDINE) NEGATIVE       BENZODIAZEPINES NEGATIVE       COCAINE NEGATIVE       AMPHETAMINES POSITIVE      OPIATES NEGATIVE       BARBITURATES NEGATIVE       TRICYCLICS POSITIVE      THC (TH-CANNABINOL) NEGATIVE      URINALYSIS W/ RFLX MICROSCOPIC    Collection Time: 08/16/17  3:50 PM   Result Value Ref Range    Color AMBER      Appearance CLOUDY      Specific gravity >=1.030 1.002 - 1.030      pH (UA) 6.0 4.5 - 8.0      Protein TRACE (A) mg/dL    Glucose NEGATIVE  mg/dL    Ketone 40 (A) NEG mg/dL    Bilirubin NEGATIVE       Blood NEGATIVE       Urobilinogen 1.0 0 - 1 EU/dL    Nitrites NEGATIVE       Leukocyte Esterase SMALL (A)      WBC 10-20 0 - 2 /hpf    RBC 0-2 0 - 2 /hpf    Bacteria 2+ /hpf    Hyaline cast 10-20 /lpf    Epithelial cells 10-20 /lpf     All lab results for the last 24 hours reviewed.    Assessment/Plan     Patient Active Hospital Problem List:     Suicidal ideations (08/16/2017)    Plan: per psych     unsure if  other medical history she provides is accurate. Recommend obtaining records from her PCP to complete record. No acute medical concerns.     Otelia Limes Tecia Cinnamon, DO  5:15 PM      Will sign off at this time

## 2017-08-16 NOTE — ED Notes (Signed)
Security notified and now sitting with pt
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
## 2017-08-16 NOTE — ED Provider Notes (Signed)
ED Provider Notes by Mollie Germany, NP at 08/16/17 1418                Author: Mollie Germany, NP  Service: EMERGENCY  Author Type: Nurse Practitioner       Filed: 08/16/17 1650  Date of Service: 08/16/17 1418  Status: Attested           Editor: Mollie Germany, NP (Nurse Practitioner)  Cosigner: Catha Nottingham, MD at 08/16/17 1746          Attestation signed by Catha Nottingham, MD at 08/16/17 1746          Case d/w the PA/nurse practitioner and I agree with the treatment and plan                                  32 yo F p/w c/o SI w/ plan to slit her own throat.  Was at local facility that prescribes her suboxone and they  took pt knife away after threatening to use on self.  Pt denies current medical complaint.   Reports she has h/o RA but has never been on any medications for this d/t having "kidney stones".        The history is provided by the patient.    Suicidal    This is a new problem. The problem has not changed since onset.There was no focality noted. There  has been no fever. Pertinent negatives include no shortness of breath, no chest pain, no vomiting, no altered mental status,  no headaches and no nausea. There were no medications administered prior to arrival.             Past Medical History:        Diagnosis  Date         ?  Bipolar 1 disorder ([place])       ?  Fibromyalgia       ?  RA (rheumatoid arthritis) ([place])           ?  Short gut syndrome               Past Surgical History:         Procedure  Laterality  Date          ?  ABDOMEN SURGERY PROC UNLISTED         ?  HX APPENDECTOMY              ?  HX LAP CHOLECYSTECTOMY                   Family History:         Problem  Relation  Age of Onset          ?  Hypertension  Mother       ?  Heart Disease  Mother       ?  Diabetes  Mother       ?  Cancer  Father       ?  Asthma  Brother       ?  Heart Disease  Paternal Grandmother            ?  Heart Disease  Paternal Grandfather               Social History          Socioeconomic History          ?  Marital status:  SINGLE              Spouse name:  Not on file         ?  Number of children:  Not on file     ?  Years of education:  Not on file     ?  Highest education level:  Not on file       Occupational History        ?  Not on file       Social Needs         ?  Financial resource strain:  Not on file        ?  Food insecurity:              Worry:  Not on file         Inability:  Not on file        ?  Transportation needs:              Medical:  Not on file         Non-medical:  Not on file       Tobacco Use         ?  Smoking status:  Current Every Day Smoker              Packs/day:  1.00         ?  Smokeless tobacco:  Never Used       Substance and Sexual Activity         ?  Alcohol use:  Not Currently     ?  Drug use:  Not Currently     ?  Sexual activity:  Yes       Lifestyle        ?  Physical activity:              Days per week:  Not on file         Minutes per session:  Not on file         ?  Stress:  Not on file       Relationships        ?  Social connections:              Talks on phone:  Not on file         Gets together:  Not on file         Attends religious service:  Not on file         Active member of club or organization:  Not on file         Attends meetings of clubs or organizations:  Not on file         Relationship status:  Not on file        ?  Intimate partner violence:              Fear of current or ex partner:  Not on file         Emotionally abused:  Not on file         Physically abused:  Not on file         Forced sexual activity:  Not on file        Other Topics  Concern        ?  Not on file       Social History Narrative        ?  Not on file              ALLERGIES: Fentanyl; Reglan [metoclopramide]; Toradol [ketorolac]; Ultram [tramadol]; and Zofran [ondansetron hcl]      Review of Systems    Constitutional: Negative for chills, diaphoresis and fever.    HENT: Negative.     Respiratory: Negative.  Negative for cough and shortness of breath.     Cardiovascular:  Negative.  Negative for chest pain, palpitations and leg swelling.    Gastrointestinal: Negative for abdominal pain, diarrhea, nausea and vomiting.    Genitourinary: Negative.  Negative for dysuria and flank pain.    Musculoskeletal: Negative.     Skin: Negative.  Negative for pallor and rash.    Neurological: Negative.  Negative for dizziness and headaches.    Hematological: Negative.     Psychiatric/Behavioral: Positive for behavioral problems and suicidal ideas .    All other systems reviewed and are negative.           Vitals:          08/16/17 1339        BP:  116/86     Pulse:  (!) 103     Resp:  18     Temp:  99.6 ??F (37.6 ??C)     SpO2:  98%     Weight:  63 kg (139 lb)        Height:  5' 5"  (1.651 m)                Physical Exam    Constitutional: She is oriented to person, place, and time. She appears well-developed and well-nourished. No distress.    HENT:    Head: Normocephalic and atraumatic.   Right Ear: External ear normal.   Left Ear: External ear normal.    Nose: Nose normal.    Mouth/Throat: Oropharynx is clear and moist.    Eyes: Pupils are equal, round, and reactive to light. Right eye exhibits no discharge. Left eye exhibits no discharge. No scleral icterus.    Neck: Normal range of motion. Neck supple. No JVD present. No tracheal deviation present.    Cardiovascular: Normal rate, regular rhythm, normal heart sounds and intact distal pulses. Exam reveals no gallop and no friction rub.    No murmur heard.   Pulmonary/Chest: Effort normal and breath sounds normal. No stridor. No respiratory distress. She has no wheezes. She has no rales.    Abdominal: Soft. Bowel sounds are normal. She exhibits no distension and no mass. There is no tenderness. There is no rebound and no guarding.   Musculoskeletal: Normal range of motion.    Neurological: She is alert and oriented to person, place, and time. No cranial nerve deficit.    Skin: Skin is warm and dry. Capillary refill takes less than 2 seconds. She is  not diaphoretic.   Track marks, no redness, swelling    Psychiatric: Judgment normal. Her speech is delayed . She exhibits a depressed mood. She expresses suicidal  ideation. She expresses suicidal plans.    Nursing note and vitals reviewed.          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       Plan:   Per orders after assessment and results presented to Dr. Richardson Dopp who agrees with formulated disposition and treatment plan.   Pt accepted by Dr Jenetta Downer for admission to [facility] and medical consult by Dr. Bennie Hind.         Results for orders placed or performed during the hospital encounter of 08/16/17     ACETAMINOPHEN         Result  Value  Ref Range            Acetaminophen level  <10 (L)  10 - 30 ug/mL       CBC WITH AUTOMATED DIFF         Result  Value  Ref Range            WBC  10.2  4.5 - 10.8 K/uL       RBC  4.36  4.2 - 5.4 M/uL       HGB  14.2  12.0 - 16.0 g/dL       HCT  41.3  41 - 53 %       MCV  94.7  80 - 100 FL       MCH  32.6 (H)  27 - 31 PG       MCHC  34.4  31 - 37 g/dL       RDW  12.5  11.5 - 14.5 %       PLATELET  285  130 - 400 K/uL       MPV  10.1  5.9 - 10.3 FL       NEUTROPHILS  69  40 - 74 %       LYMPHOCYTES  19  19 - 48 %       MONOCYTES  10 (H)  3 - 9 %       EOSINOPHILS  1  0 - 5 %       BASOPHILS  1  0 - 2 %       ABS. NEUTROPHILS  7.1  1.5 - 8.0  K/UL       ABS. LYMPHOCYTES  2.0  0.8 - 3.5 K/UL       ABS. MONOCYTES  1.0  0.8 - 3.5 K/UL       ABS. EOSINOPHILS  0.1  0.0 - 0.5 K/UL       ABS. BASOPHILS  0.1  0.0 - 0.1 K/UL       DF  AUTOMATED          IMMATURE GRANULOCYTES  0 (L)  2 - 10 %       ABS. IMM. GRANS.  0.0  K/UL       DRUG SCREEN, URINE         Result  Value  Ref Range            METHADONE  NEGATIVE           PCP(PHENCYCLIDINE)  NEGATIVE  BENZODIAZEPINES  NEGATIVE           COCAINE  NEGATIVE           AMPHETAMINES  POSITIVE          OPIATES  NEGATIVE                BARBITURATES  NEGATIVE                TRICYCLICS  POSITIVE          THC (TH-CANNABINOL)  NEGATIVE           ETHYL ALCOHOL         Result  Value  Ref Range            ALCOHOL(ETHYL),SERUM  None detected  MG/DL       METABOLIC PANEL, COMPREHENSIVE         Result  Value  Ref Range            Sodium  141  136 - 145 mmol/L       Potassium  3.6  3.5 - 5.3 mmol/L       Chloride  111 (H)  98 - 107 mmol/L       CO2  19 (L)  21 - 32 mmol/L       Anion gap  11  6 - 15 mmol/L       Glucose  90  70 - 110 mg/dL       BUN  13  7 - 18 MG/DL       Creatinine  0.98  0.60 - 1.30 MG/DL       BUN/Creatinine ratio  13  7 - 25         GFR est AA  >60  >60 ml/min/1.48m       GFR est non-AA  >60  >60 ml/min/1.719m      Calcium  9.1  8.5 - 10.1 MG/DL       Bilirubin, total  0.7  <1.1 MG/DL       ALT (SGPT)  36  12 - 78 U/L       AST (SGOT)  15  15 - 37 U/L       Alk. phosphatase  118 (H)  45 - 117 U/L       Protein, total  7.6  6.4 - 8.2 g/dL       Albumin  3.9  3.4 - 5.0 g/dL       Globulin  3.7 (H)  2.4 - 3.5 g/dL       A-G Ratio  1.1 (L)  1.2 - 2.2         SALICYLATE         Result  Value  Ref Range            Salicylate level  <2<5.4L)  2.8 - 20.0 MG/DL       URINALYSIS W/ RFLX MICROSCOPIC         Result  Value  Ref Range            Color  AMBER          Appearance  CLOUDY          Specific gravity  >=1.030  1.002 - 1.030         pH (UA)  6.0  4.5 - 8.0         Protein  TRACE (A)  mg/dL       Glucose  NEGATIVE   mg/dL       Ketone  40 (A)  NEG mg/dL       Bilirubin  NEGATIVE           Blood  NEGATIVE           Urobilinogen  1.0  0 - 1 EU/dL       Nitrites  NEGATIVE           Leukocyte Esterase  SMALL (A)          WBC  10-20  0 - 2 /hpf       RBC  0-2  0 - 2 /hpf       Bacteria  2+  /hpf       Hyaline cast  10-20  /lpf       Epithelial cells  10-20  /lpf       THYROID PANEL W/TSH         Result  Value  Ref Range            TSH  0.94  0.35 - 3.74 uIU/mL       T4, Total  9.5  4.7 - 13.3 ug/dL       T3 Uptake  31  31 - 39 %       Free thyroxine index  2.9  1.4 - 5.2         HCG QL SERUM         Result  Value  Ref Range            HCG, Ql.  NEGATIVE

## 2017-08-16 NOTE — ED Notes (Signed)
Dr. smutko at bedside

## 2017-08-16 NOTE — ED Notes (Signed)
Pt refusing to provide urine sample unless she gets her phone back.

## 2017-08-16 NOTE — Other (Signed)
Pt accepted to behavioral health unit by Dr. Daria Pasturesloworaran. Additional orders placed in s/h. Notified Lana,RN and assigned bed 110-2.
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
## 2017-08-16 NOTE — ED Notes (Signed)
Pt eating tray in room. Security at bedside.
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
## 2017-08-16 NOTE — ED Provider Notes (Signed)
32 yo F p/w c/o SI w/ plan to slit her own throat.  Was at local facility that prescribes her suboxone and they took pt knife away after threatening to use on self.  Pt denies current medical complaint.   Reports she has h/o RA but has never been on any medications for this d/t having "kidney stones".      The history is provided by the patient.   Suicidal   This is a new problem. The problem has not changed since onset.There was no focality noted. There has been no fever. Pertinent negatives include no shortness of breath, no chest pain, no vomiting, no altered mental status, no headaches and no nausea. There were no medications administered prior to arrival.        Past Medical History:   Diagnosis Date   ??? Bipolar 1 disorder ([place])    ??? Fibromyalgia    ??? RA (rheumatoid arthritis) ([place])    ??? Short gut syndrome        Past Surgical History:   Procedure Laterality Date   ??? ABDOMEN SURGERY PROC UNLISTED     ??? HX APPENDECTOMY     ??? HX LAP CHOLECYSTECTOMY           Family History:   Problem Relation Age of Onset   ??? Hypertension Mother    ??? Heart Disease Mother    ??? Diabetes Mother    ??? Cancer Father    ??? Asthma Brother    ??? Heart Disease Paternal Grandmother    ??? Heart Disease Paternal Grandfather        Social History     Socioeconomic History   ??? Marital status: SINGLE     Spouse name: Not on file   ??? Number of children: Not on file   ??? Years of education: Not on file   ??? Highest education level: Not on file   Occupational History   ??? Not on file   Social Needs   ??? Financial resource strain: Not on file   ??? Food insecurity:     Worry: Not on file     Inability: Not on file   ??? Transportation needs:     Medical: Not on file     Non-medical: Not on file   Tobacco Use   ??? Smoking status: Current Every Day Smoker     Packs/day: 1.00   ??? Smokeless tobacco: Never Used   Substance and Sexual Activity   ??? Alcohol use: Not Currently   ??? Drug use: Not Currently   ??? Sexual activity: Yes   Lifestyle   ??? Physical activity:      Days per week: Not on file     Minutes per session: Not on file   ??? Stress: Not on file   Relationships   ??? Social connections:     Talks on phone: Not on file     Gets together: Not on file     Attends religious service: Not on file     Active member of club or organization: Not on file     Attends meetings of clubs or organizations: Not on file     Relationship status: Not on file   ??? Intimate partner violence:     Fear of current or ex partner: Not on file     Emotionally abused: Not on file     Physically abused: Not on file     Forced sexual activity: Not on file  Other Topics Concern   ??? Not on file   Social History Narrative   ??? Not on file         ALLERGIES: Fentanyl; Reglan [metoclopramide]; Toradol [ketorolac]; Ultram [tramadol]; and Zofran [ondansetron hcl]    Review of Systems   Constitutional: Negative for chills, diaphoresis and fever.   HENT: Negative.    Respiratory: Negative.  Negative for cough and shortness of breath.    Cardiovascular: Negative.  Negative for chest pain, palpitations and leg swelling.   Gastrointestinal: Negative for abdominal pain, diarrhea, nausea and vomiting.   Genitourinary: Negative.  Negative for dysuria and flank pain.   Musculoskeletal: Negative.    Skin: Negative.  Negative for pallor and rash.   Neurological: Negative.  Negative for dizziness and headaches.   Hematological: Negative.    Psychiatric/Behavioral: Positive for behavioral problems and suicidal ideas.   All other systems reviewed and are negative.      Vitals:    08/16/17 1339   BP: 116/86   Pulse: (!) 103   Resp: 18   Temp: 99.6 ??F (37.6 ??C)   SpO2: 98%   Weight: 63 kg (139 lb)   Height: 5' 5"  (1.651 m)            Physical Exam   Constitutional: She is oriented to person, place, and time. She appears well-developed and well-nourished. No distress.   HENT:   Head: Normocephalic and atraumatic.   Right Ear: External ear normal.   Left Ear: External ear normal.   Nose: Nose normal.    Mouth/Throat: Oropharynx is clear and moist.   Eyes: Pupils are equal, round, and reactive to light. Right eye exhibits no discharge. Left eye exhibits no discharge. No scleral icterus.   Neck: Normal range of motion. Neck supple. No JVD present. No tracheal deviation present.   Cardiovascular: Normal rate, regular rhythm, normal heart sounds and intact distal pulses. Exam reveals no gallop and no friction rub.   No murmur heard.  Pulmonary/Chest: Effort normal and breath sounds normal. No stridor. No respiratory distress. She has no wheezes. She has no rales.   Abdominal: Soft. Bowel sounds are normal. She exhibits no distension and no mass. There is no tenderness. There is no rebound and no guarding.   Musculoskeletal: Normal range of motion.   Neurological: She is alert and oriented to person, place, and time. No cranial nerve deficit.   Skin: Skin is warm and dry. Capillary refill takes less than 2 seconds. She is not diaphoretic.   Track marks, no redness, swelling   Psychiatric: Judgment normal. Her speech is delayed. She exhibits a depressed mood. She expresses suicidal ideation. She expresses suicidal plans.   Nursing note and vitals reviewed.       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     Plan:  Per orders after assessment and results presented to Dr. Richardson Dopp who  agrees with formulated disposition and treatment plan.  Pt accepted by Dr Jenetta Downer for admission to [facility] and medical consult by Dr. Bennie Hind.     Results for orders placed or performed during the hospital encounter of 08/16/17   ACETAMINOPHEN   Result Value Ref Range    Acetaminophen level <10 (L) 10 - 30 ug/mL   CBC WITH AUTOMATED DIFF   Result Value Ref Range    WBC 10.2 4.5 - 10.8 K/uL    RBC 4.36 4.2 - 5.4 M/uL    HGB 14.2 12.0 - 16.0 g/dL    HCT 41.3 41 - 53 %    MCV 94.7 80 - 100 FL    MCH 32.6 (H) 27 - 31 PG    MCHC 34.4 31 - 37 g/dL    RDW 12.5 11.5 - 14.5 %    PLATELET 285 130 - 400 K/uL    MPV 10.1 5.9 - 10.3 FL    NEUTROPHILS 69 40 - 74 %    LYMPHOCYTES 19 19 - 48 %    MONOCYTES 10 (H) 3 - 9 %    EOSINOPHILS 1 0 - 5 %    BASOPHILS 1 0 - 2 %    ABS. NEUTROPHILS 7.1 1.5 - 8.0 K/UL    ABS. LYMPHOCYTES 2.0 0.8 - 3.5 K/UL    ABS. MONOCYTES 1.0 0.8 - 3.5 K/UL    ABS. EOSINOPHILS 0.1 0.0 - 0.5 K/UL    ABS. BASOPHILS 0.1 0.0 - 0.1 K/UL    DF AUTOMATED      IMMATURE GRANULOCYTES 0 (L) 2 - 10 %    ABS. IMM. GRANS. 0.0 K/UL   DRUG SCREEN, URINE   Result Value Ref Range    METHADONE NEGATIVE       PCP(PHENCYCLIDINE) NEGATIVE       BENZODIAZEPINES NEGATIVE       COCAINE NEGATIVE       AMPHETAMINES POSITIVE      OPIATES NEGATIVE       BARBITURATES NEGATIVE       TRICYCLICS POSITIVE      THC (TH-CANNABINOL) NEGATIVE      ETHYL ALCOHOL   Result Value Ref Range    ALCOHOL(ETHYL),SERUM None detected MG/DL   METABOLIC PANEL, COMPREHENSIVE   Result Value Ref Range    Sodium 141 136 - 145 mmol/L    Potassium 3.6 3.5 - 5.3 mmol/L    Chloride 111 (H) 98 - 107 mmol/L    CO2 19 (L) 21 - 32 mmol/L    Anion gap 11 6 - 15 mmol/L    Glucose 90 70 - 110 mg/dL    BUN 13 7 - 18 MG/DL    Creatinine 0.98 0.60 - 1.30 MG/DL    BUN/Creatinine ratio 13 7 - 25      GFR est AA >60 >60 ml/min/1.46m    GFR est non-AA >60 >60 ml/min/1.774m   Calcium 9.1 8.5 - 10.1 MG/DL    Bilirubin, total 0.7 <1.1 MG/DL    ALT (SGPT) 36 12 - 78 U/L     AST (SGOT) 15 15 - 37 U/L    Alk. phosphatase 118 (H) 45 - 117 U/L    Protein, total 7.6 6.4 - 8.2 g/dL    Albumin 3.9 3.4 - 5.0 g/dL    Globulin 3.7 (H) 2.4 - 3.5 g/dL    A-G Ratio 1.1 (L) 1.2 - 2.2  SALICYLATE   Result Value Ref Range    Salicylate level <2.1 (L) 2.8 - 20.0 MG/DL   URINALYSIS W/ RFLX MICROSCOPIC   Result Value Ref Range    Color AMBER      Appearance CLOUDY      Specific gravity >=1.030 1.002 - 1.030      pH (UA) 6.0 4.5 - 8.0      Protein TRACE (A) mg/dL    Glucose NEGATIVE  mg/dL    Ketone 40 (A) NEG mg/dL    Bilirubin NEGATIVE       Blood NEGATIVE       Urobilinogen 1.0 0 - 1 EU/dL    Nitrites NEGATIVE       Leukocyte Esterase SMALL (A)      WBC 10-20 0 - 2 /hpf    RBC 0-2 0 - 2 /hpf    Bacteria 2+ /hpf    Hyaline cast 10-20 /lpf    Epithelial cells 10-20 /lpf   THYROID PANEL W/TSH   Result Value Ref Range    TSH 0.94 0.35 - 3.74 uIU/mL    T4, Total 9.5 4.7 - 13.3 ug/dL    T3 Uptake 31 31 - 39 %    Free thyroxine index 2.9 1.4 - 5.2     HCG QL SERUM   Result Value Ref Range    HCG, Ql. NEGATIVE

## 2017-08-16 NOTE — ED Notes (Signed)
Security notified and now sitting with pt

## 2017-08-16 NOTE — Treatment Summary (Signed)
MTP1[facility]  [facility]  Inpatient [facility]  INITIAL Treatment Plan        Date of Admit:  08/16/2017   Patient's Name    Angela Bishop DOB:  1986/01/08 Date Plan Initiated:    08/16/2017 MRN:  [id number]     Admission Dx: Suicidal ideation     Problem  Number PROBLEM  Include Significant Medical Issues Date Established Status      A = Active  D = Deferred  I = Inactive  R = Resolved  M = Monitoring   1. Risk of harm to self 08/16/17 A   2 Depression 08/16/17 A   3 Anxiety 08/16/17 A   4 paranoia 08/16/17 A   5        Goals   Interventions/Modality        [x]  STG: Pt will remain safe for next 24 hours.  [x]   STG: Pt will verbalize all Suicidal and/or homicidal thoughts to staff for the next 24 hours.  [x]  STG: Pt will verbalize an understanding of possible symptoms and be able to identify at least 2 active symptoms.  [x]   STG: Pt will communicate the reason for admission and identify at least one goal for their treatment.  [x]   STG:Pt will verbalize an understanding of unit rules and expectations.  [x]   STG: Pt will communicate an understanding of one beginning symptoms of their Dx.   [x]   LTG: Pt will begin identifying at least one support system [employer](Community, family, peer, church official etc). [x]   Nursing staff will monitored for safety including removing unsafe items, assessing current commitment for safety, and monitoring.  [x]   Nursing staff will provide medication as prescribed by physician at each prescribed time.  [x]   Nursing staff will complete vital assessment per shift to monitor medical symptoms.  [x]   Nursing staff will report symptoms assessed and identified during their shift at shift change to oncoming nurse.  [x]   Nursing staff will monitor observation type (1:1, elopment, etc)  [x]   Nursing staff will monitor precaution type (falls, suicide, etc)  []   Other:     Preliminary Discharge Plan  (outpatient, inpatient, substance abuse,  mental health, etc): Mental health   Preliminary Discharge Location (return home, name of nursing home, rehab facility etc):  Home   Staff Initiating Care Plan: Murtis SinkAngela Gibson, RN

## 2017-08-16 NOTE — Other (Signed)
TRANSFER - OUT REPORT:    Verbal report given to Baruch Goutyale RN (name) on Angela Bishop  being transferred to behavioral (unit) for routine progression of care       Report consisted of patient???s Situation, Background, Assessment and   Recommendations(SBAR).     Information from the following report(s) SBAR, Kardex, ED Summary, [facility] and Recent Results was reviewed with the receiving nurse.    Lines:       Opportunity for questions and clarification was provided.      Patient transported with:   [employer]   Security  intake

## 2017-08-16 NOTE — ED Triage Notes (Signed)
Pt c/o feeling suicidal, also c/o depression.
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
## 2017-08-16 NOTE — Consults (Signed)
Consult        Patient: Angela Bishop               Sex: female             MRN: [id number]     Date of Birth:  1985-12-01      Age:  32 y.o. Angela Edwards, NP               CC:    Chief Complaint   Patient presents with   ??? Suicidal       Consult by:  psych    HPI:     Angela Bishop is a 32 y.o. female who I am consulted for medical management. She recently moved here from [place]. She states she has and extensive medical history with multiple surgeries and RA however has no physician caring for her.     Past Medical History:   Diagnosis Date   ??? Bipolar 1 disorder ([place])    ??? Fibromyalgia    ??? Hepatitis C    ??? Pancreatitis, chronic ([place])    ??? RA (rheumatoid arthritis) ([place])    ??? Short gut syndrome        Past Surgical History:   Procedure Laterality Date   ??? ABDOMEN SURGERY PROC UNLISTED     ??? HX APPENDECTOMY     ??? HX LAP CHOLECYSTECTOMY         Family History   Problem Relation Age of Onset   ??? Hypertension Mother    ??? Heart Disease Mother    ??? Diabetes Mother    ??? Cancer Father    ??? Asthma Brother    ??? Heart Disease Paternal Grandmother    ??? Heart Disease Paternal Grandfather        Social History     Socioeconomic History   ??? Marital status: SINGLE     Spouse name: Not on file   ??? Number of children: Not on file   ??? Years of education: Not on file   ??? Highest education level: Not on file   Tobacco Use   ??? Smoking status: Current Every Day Smoker     Packs/day: 1.00   ??? Smokeless tobacco: Never Used   Substance and Sexual Activity   ??? Alcohol use: Not Currently   ??? Drug use: Not Currently   ??? Sexual activity: Yes       Prior to Admission Medications   Prescriptions Last Dose Informant Patient Reported? Taking?   QUEtiapine (SEROQUEL) 300 mg tablet   Yes Yes   Sig: take 2 tablets by mouth at bedtime   SUBOXONE 8-2 mg film sublingaul film   Yes Yes   Sig: take 2 FILMs under the tongue once daily   diphenoxylate-atropine (LOMOTIL) 2.5-0.025 mg per tablet   Yes Yes   Sig: Take 2 Tabs by mouth.    medroxyPROGESTERone (DEPO-PROVERA) 150 mg/mL injection   Yes Yes   Sig: 150 mg by IntraMUSCular route once.   pregabalin (LYRICA) 200 mg capsule   Yes Yes   Sig: Take 200 mg by mouth.   traZODone (DESYREL) 150 mg tablet   Yes Yes   Sig: take 2 tablets by mouth at bedtime      Facility-Administered Medications: None       Allergies   Allergen Reactions   ??? Fentanyl Swelling   ??? Reglan [Metoclopramide] Other (comments)   ??? Toradol [Ketorolac] Swelling   ??? Ultram [Tramadol] Nausea and Vomiting   ???  Zofran [Ondansetron Hcl] Other (comments)         There is no immunization history on file for this patient.    Review of Systems  A comprehensive review of systems was negative except for that written in the History of Present Illness.    Physical Exam:     Visit Vitals  BP 116/86 (BP 1 Location: Right arm, BP Patient Position: At rest)   Pulse (!) 103   Temp 99.6 ??F (37.6 ??C)   Resp 18   Ht '5\' 5"'$  (1.651 m)   Wt 63 kg (139 lb)   SpO2 98%   BMI 23.13 kg/m??     General appearance: Alert, cooperative, no distress.  Head: Normocephalic, without obvious abnormality, atraumatic.  Eyes: Conjunctivae/corneas clear. PERRL, EOM's intact.  Ears, nose, throat: Lips, mucosa, and tongue normal. Teeth and gums normal and normal findings: oropharynx pink & moist without lesions.  Respiratory: Clear to auscultation bilaterally.  Cardiovascular: Regular rate and rhythm, S1, S2 normal, no JVD, no murmur or pedal edema.  Gastrointestinal: Soft, non-tender, non-distended, bowel sounds present, no organomegaly.  Genitourinary: No scrotal mass or hernia. Bimanual examination not performed.   Musculoskeletal: No edema, redness or tenderness in the calves or thighs.  Vascular: 2+ and symmetric.  No carotid bruits.   Skin: Skin color, texture, turgor normal. No rashes or lesions.  Neurologic: Alert and oriented X 3, normal strength and tone. Normal symmetric reflexes. CN exam deferred to psychiatry   Hematologic/Lymphatic: No ecchymosis or petechiae. No lymphadenopathy.    Psychiatric: No confusion or hallucinations.     Lab/Data Reviewed:  Recent Results (from the past 24 hour(s))   ACETAMINOPHEN    Collection Time: 08/16/17  2:27 PM   Result Value Ref Range    Acetaminophen level <10 (L) 10 - 30 ug/mL   CBC WITH AUTOMATED DIFF    Collection Time: 08/16/17  2:27 PM   Result Value Ref Range    WBC 10.2 4.5 - 10.8 K/uL    RBC 4.36 4.2 - 5.4 M/uL    HGB 14.2 12.0 - 16.0 g/dL    HCT 41.3 41 - 53 %    MCV 94.7 80 - 100 FL    MCH 32.6 (H) 27 - 31 PG    MCHC 34.4 31 - 37 g/dL    RDW 12.5 11.5 - 14.5 %    PLATELET 285 130 - 400 K/uL    MPV 10.1 5.9 - 10.3 FL    NEUTROPHILS 69 40 - 74 %    LYMPHOCYTES 19 19 - 48 %    MONOCYTES 10 (H) 3 - 9 %    EOSINOPHILS 1 0 - 5 %    BASOPHILS 1 0 - 2 %    ABS. NEUTROPHILS 7.1 1.5 - 8.0 K/UL    ABS. LYMPHOCYTES 2.0 0.8 - 3.5 K/UL    ABS. MONOCYTES 1.0 0.8 - 3.5 K/UL    ABS. EOSINOPHILS 0.1 0.0 - 0.5 K/UL    ABS. BASOPHILS 0.1 0.0 - 0.1 K/UL    DF AUTOMATED      IMMATURE GRANULOCYTES 0 (L) 2 - 10 %    ABS. IMM. GRANS. 0.0 K/UL   ETHYL ALCOHOL    Collection Time: 08/16/17  2:27 PM   Result Value Ref Range    ALCOHOL(ETHYL),SERUM None detected MG/DL   METABOLIC PANEL, COMPREHENSIVE    Collection Time: 08/16/17  2:27 PM   Result Value Ref Range    Sodium 141 136 - 145  mmol/L    Potassium 3.6 3.5 - 5.3 mmol/L    Chloride 111 (H) 98 - 107 mmol/L    CO2 19 (L) 21 - 32 mmol/L    Anion gap 11 6 - 15 mmol/L    Glucose 90 70 - 110 mg/dL    BUN 13 7 - 18 MG/DL    Creatinine 0.98 0.60 - 1.30 MG/DL    BUN/Creatinine ratio 13 7 - 25      GFR est AA >60 >60 ml/min/1.5m    GFR est non-AA >60 >60 ml/min/1.726m   Calcium 9.1 8.5 - 10.1 MG/DL    Bilirubin, total 0.7 <1.1 MG/DL    ALT (SGPT) 36 12 - 78 U/L    AST (SGOT) 15 15 - 37 U/L    Alk. phosphatase 118 (H) 45 - 117 U/L    Protein, total 7.6 6.4 - 8.2 g/dL    Albumin 3.9 3.4 - 5.0 g/dL    Globulin 3.7 (H) 2.4 - 3.5 g/dL     A-G Ratio 1.1 (L) 1.2 - 2.2     SALICYLATE    Collection Time: 08/16/17  2:27 PM   Result Value Ref Range    Salicylate level <2<5.3L) 2.8 - 20.0 MG/DL   THYROID PANEL W/TSH    Collection Time: 08/16/17  2:27 PM   Result Value Ref Range    TSH 0.94 0.35 - 3.74 uIU/mL    T4, Total 9.5 4.7 - 13.3 ug/dL    T3 Uptake 31 31 - 39 %    Free thyroxine index 2.9 1.4 - 5.2     HCG QL SERUM    Collection Time: 08/16/17  2:27 PM   Result Value Ref Range    HCG, Ql. NEGATIVE      DRUG SCREEN, URINE    Collection Time: 08/16/17  3:50 PM   Result Value Ref Range    METHADONE NEGATIVE       PCP(PHENCYCLIDINE) NEGATIVE       BENZODIAZEPINES NEGATIVE       COCAINE NEGATIVE       AMPHETAMINES POSITIVE      OPIATES NEGATIVE       BARBITURATES NEGATIVE       TRICYCLICS POSITIVE      THC (TH-CANNABINOL) NEGATIVE      URINALYSIS W/ RFLX MICROSCOPIC    Collection Time: 08/16/17  3:50 PM   Result Value Ref Range    Color AMBER      Appearance CLOUDY      Specific gravity >=1.030 1.002 - 1.030      pH (UA) 6.0 4.5 - 8.0      Protein TRACE (A) mg/dL    Glucose NEGATIVE  mg/dL    Ketone 40 (A) NEG mg/dL    Bilirubin NEGATIVE       Blood NEGATIVE       Urobilinogen 1.0 0 - 1 EU/dL    Nitrites NEGATIVE       Leukocyte Esterase SMALL (A)      WBC 10-20 0 - 2 /hpf    RBC 0-2 0 - 2 /hpf    Bacteria 2+ /hpf    Hyaline cast 10-20 /lpf    Epithelial cells 10-20 /lpf     All lab results for the last 24 hours reviewed.    Assessment/Plan     Patient Active Hospital Problem List:     Suicidal ideations (08/16/2017)    Plan: per psych     unsure if  other medical history she provides is accurate. Recommend obtaining records from her PCP to complete record. No acute medical concerns.     Otelia Limes Lee-Ann Gal, DO  5:15 PM      Will sign off at this time

## 2017-08-17 LAB — VITAMIN B12
Vitamin B-12: 285 pg/mL (ref 254–1320)
Vitamin B12: 285 pg/mL (ref 254–1320)

## 2017-08-17 LAB — LIPID PANEL
Cholesterol, Total: 183 MG/DL (ref <200)
Cholesterol, total: 183 MG/DL (ref <200)
HDL Cholesterol: 34 MG/DL (ref 32–96)
HDL: 34 MG/DL (ref 32–96)
LDL Calculated: 122.76 MG/DL (ref <130)
LDL, calculated: 122.76 MG/DL (ref <130)
Triglyceride: 164 MG/DL — ABNORMAL HIGH (ref <150)
Triglycerides: 164 MG/DL — ABNORMAL HIGH (ref <150)
VLDL Cholesterol Calculated: 32.8 MG/DL — ABNORMAL HIGH (ref 5–32)
VLDL, calculated: 32.8 MG/DL — ABNORMAL HIGH (ref 5–32)

## 2017-08-17 LAB — FOLATE
Folate: >20 ng/mL — ABNORMAL HIGH (ref 3.1–17.5)
Folate: >20 ng/mL — ABNORMAL HIGH (ref 3.1–17.5)

## 2017-08-17 MED ORDER — IBUPROFEN 600 MG TAB
600 mg | ORAL | Status: AC
Start: 2017-08-17 — End: 2017-08-17
  Administered 2017-08-17: 16:00:00 via ORAL

## 2017-08-17 MED FILL — TRAZODONE 150 MG TAB: 150 mg | ORAL | Qty: 1

## 2017-08-17 MED FILL — HYDROXYZINE PAMOATE 50 MG CAP: 50 mg | ORAL | Qty: 1

## 2017-08-17 MED FILL — IBUPROFEN 600 MG TAB: 600 mg | ORAL | Qty: 1

## 2017-08-17 MED FILL — TAB-A-VITE TABLET: ORAL | Qty: 1

## 2017-08-17 NOTE — H&P (Signed)
H and P already dictated [id number]

## 2017-08-17 NOTE — Behavioral Health Treatment Team (Signed)
[facility]  INPATIENT BEHAVIORAL HEALTH  NURSING PROGRESS NOTE  Angela Bishop  08/17/2017; 1430      BEHAVIORAL ASSESSMENT  Especially commenting on behaviors related to the admitting diagnosis.  Pt has been up on the unit most of the day. Pt is anxious, alert and oriented x 3 with good eye contact. Pt denies S/S, H/S, A/V/S and no delusional thinking is noted. Pt is clean and groomed and voicing no complaints.  CIWA Scale:  COW Scale:  PAIN MEDICATION GIVEN THIS SHIFT; none   PAIN ASSESSMENT AND REASSESSMENT COMPLETED: yes      Sleep Assessment  Hours nightly slept: 7  Difficulty Falling Asleep: No  Difficulty Staying Asleep: No  Broken Sleep: {YES/NO:none  Clinician's Observations (Be Descriptive): unremarkable     Appetite Assessment  Percentage of Patient's Food Consumption: 95%  Clinician's Observations (Be Descriptive): unremarkable    COLUMBIA-SUICIDE SEVERITY RATING SCALE  Daily/Shift Screen    Ask questions that are bold and underlined Since Last Asked   Ask Question 2* YES NO   2) Suicidal Thoughts:     Since you were last asked, have you actually had thoughts about killing yourself?   x   If YES to 2, ask questions 3, 4, 5, and 6.  If NO to 2, go directly to question 6   3) Suicidal Thoughts with Method (without Specific Plan or Intent to Act):     Have you been thinking about how you might do this?      4) Suicidal Intent (without Specific Plan):     Have you had these thoughts and had some intention of acting on them?      5) Suicide Intent with Specific Plan:     Have you started to work out or worked out the details of how to kill yourself? Do you intend to carry out this plan?      6) Suicide Behavior    Have you done anything, started to do anything, or prepared to do anything to end your life?    Examples: Collected pills, obtained a gun, gave away valuables, wrote a will or suicide note, took out pills but didn't swallow any, held a gun but changed your mind or it was  grabbed from your hand, went to the roof but didn't jump; or actually took pills, tried to shoot yourself, cut yourself, tried to hang yourself, etc.  If YES, what did you do?____________________________________________  _______________________________________________________________  x     * Note - for frequent assessment purposes, Question 1 has been omitted           If the patient came in suicidal and now denies ask the patient what has changed and list response: Pt denies suicidal thoughts.             INTERVENTIONS      Treatment Plan Problem List & Correlating Goals/Interventions    Problem # Problem   1 Risk of harm to self   2 depression   3 anxiety   4 paranoia           CLIENT RESPONSE TO TREATMENT    Describe Patient Response to Master Treatment Plan's Goals/Interventions: Pt denies suicidal thoughts and fifteen minute rounds are being done. Pt rates depression 7/10 and anxiety 10/10.
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
## 2017-08-17 NOTE — H&P (Signed)
H and P already dictated [id number]

## 2017-08-17 NOTE — Behavioral Health Treatment Team (Signed)
[facility]  INPATIENT BEH[facility] HEALTH  NURSING PROGRESS NOTE  Angela LettersStephenie Bishop  08/17/2017; 1430      BEHAVIORAL ASSESSMENT  Especially commenting on behaviors related to the admitting diagnosis.  Pt has been up on the unit most of the day. Pt is anxious, alert and oriented x 3 with good eye contact. Pt denies S/S, H/S, A/V/S and no delusional thinking is noted. Pt is clean and groomed and voicing no complaints.  CIWA Scale:  COW Scale:  PAIN MEDICATION GIVEN THIS SHIFT; none   PAIN ASSESSMENT AND REASSESSMENT COMPLETED: yes      Sleep Assessment  Hours nightly slept: 7  Difficulty Falling Asleep: No  Difficulty Staying Asleep: No  Broken Sleep: {YES/NO:none  Clinician's Observations (Be Descriptive): unremarkable     Appetite Assessment  Percentage of Patient's Food Consumption: 95%  Clinician's Observations (Be Descriptive): unremarkable    COLUMBIA-SUICIDE SEVERITY RATING SCALE  Daily/Shift Screen    Ask questions that are bold and underlined Since Last Asked   Ask Question 2* YES NO   2) Suicidal Thoughts:     Since you were last asked, have you actually had thoughts about killing yourself?   x   If YES to 2, ask questions 3, 4, 5, and 6.  If NO to 2, go directly to question 6   3) Suicidal Thoughts with Method (without Specific Plan or Intent to Act):     Have you been thinking about how you might do this?      4) Suicidal Intent (without Specific Plan):     Have you had these thoughts and had some intention of acting on them?      5) Suicide Intent with Specific Plan:     Have you started to work out or worked out the details of how to kill yourself? Do you intend to carry out this plan?      6) Suicide Behavior    Have you done anything, started to do anything, or prepared to do anything to end your life?    Examples: Collected pills, obtained a gun, gave away valuables, wrote a will or suicide note, took out pills but didn???t swallow any, held a gun  but changed your mind or it was grabbed from your hand, went to the roof but didn???t jump; or actually took pills, tried to shoot yourself, cut yourself, tried to hang yourself, etc.  If YES, what did you do?____________________________________________  _______________________________________________________________  x     * Note ??? for frequent assessment purposes, Question 1 has been omitted           If the patient came in suicidal and now denies ask the patient what has changed and list response: Pt denies suicidal thoughts.             INTERVENTIONS      Treatment Plan Problem List & Correlating Goals/Interventions    Problem # Problem   1 Risk of harm to self   2 depression   3 anxiety   4 paranoia           CLIENT RESPONSE TO TREATMENT    Describe Patient Response to Master Treatment Plan's Goals/Interventions: Pt denies suicidal thoughts and fifteen minute rounds are being done. Pt rates depression 7/10 and anxiety 10/10.

## 2017-08-17 NOTE — Other (Signed)
Despite prompting, pt declined attending 11-12pm group. Therapist will continue to encourage group participation.

## 2017-08-17 NOTE — Other (Signed)
Recreation Group: Pt declined scheduled??3pm??recreation group opting to remain standing in the hallway despite prompting from AT.

## 2017-08-17 NOTE — Other (Signed)
Recreation Group: Pt declined scheduled??10am??recreation group opting to remain in bed despite prompting from AT.

## 2017-08-17 NOTE — Other (Signed)
Spirituality Group Note: Patient declined scheduled 9 AM voluntary worship session.
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
## 2017-08-17 NOTE — Other (Signed)
BEHAVIORAL HEALTH  ACTIVITY SPECIALIST ASSESSMENT    Admission Date: 08/16/17 D/X: SI     Evaluation Date: 08/17/2017 Evaluation Time: 1100 Precautions: None     Physical Function ADL: self-feeding, grooming, bathing, upper body dressing, lower body dressing, toileting, toilet transfer, shower transfer, tub transfer and simple home management Ambulatory: Yes     Place "X" next to selection.  Assistive Function [ ]   Walker [ ]  Cane [ ]  Wheelchair [ ]  Other     Place "X" next to selection.  Following Directions Attention Span Motivation Level   [ ]  Unable to follow simple commands. [ ]  5 Minutes or Less [ ]  Maximum encouragement needed.   [ ]  Able to follow simple commands. [ ]  5-15 Minutes [ ]  Moderate encouragement needed.   [x ] Able to follow multi-step commands. [x ] 15-30 Minutes [ x] Minimum encouragement needed.    [ ]  60 Minutes or More [ ]  No encouragement needed.     Patient's Living Situation: Alone    Past Interests & Hobbies Current Interests & Hobbies   1. 1.Listen to music   2. 2.   3. 3.     What does the patient do to cope with presenting problem (i.e. Stress, Depression, Trigger, etc.): Listen to music    Place "X" next to selection; Specify Leisure Activities & Barriers.  Enjoyed Leisure Activities Barriers to Enjoyed Leisure Activities   [x ] Social:  [ ]  Mobility:    [x ] Solitary:  [ ]  Endurance:    [ ]  Physical:  [ ]  Coordination:    [ ]  Creative:  [ ]  Flexibility:    [ ]  Outdoor:  [ ]  Finance:    [ ]  Spectator:  [ ]  Time Availability:    [ x] Passive:  [ ]  Transportation:    [ ]  Other:  [ ]  Cognitive:     [ ]  Physical Disability:     [ x] Other: Body pain     Does patient believe these activities are beneficial to their overall well-being: Yes    Place "X" next to selection.  Motivators for Recreation/Leisure Involvement     [x ] Relaxation [ ]  Fun/Entertainment   [ ]  Physical Benefits [ ]  Intellectual Stimulation   [ ]  Social Interaction [ ]  Creative Expression    [ ]  Self Esteem or Sense of Accomplishment      Place "X" next to selection.  Patient Strengths & Areas of Potential     [ ]  Interests & Hobbies [ ]  Insight into Leisure Needs   [ ]  Reality Oriented [ ]  Motivated for Change   [ ]  Social Support [ ]  Good Physical Health   [ ]  Education [ ]  Positive Coping Skills     Place "X" next to selection  Patient Educational Needs     [ ]  Self-Esteem [ ]  Relaxation   [ ]  Depression [ ]  Social Skills   [ ]  Anger Control [ ]  Grief & Loss   [x ] Leisure Awareness [ ]  Stress Management   [x ] Leisure Education [ ]  Coping Skills     Are there any leisure activities the patient is interested in upon discharge: No: N/A. Explain: N/A    Arrangements staff can make to help facilitate these activities: N/A    Lance B. Sydnor  ______________________________  Activities Specialist  08/17/2017

## 2017-08-18 MED ORDER — BUPROPION 75 MG TAB
75 mg | Freq: Two times a day (BID) | ORAL | Status: DC
Start: 2017-08-18 — End: 2017-08-20
  Administered 2017-08-18 – 2017-08-20 (×6): via ORAL

## 2017-08-18 MED ORDER — FLUTICASONE-SALMETEROL 45 MCG-21 MCG/ACTUATION AEROSOL INHALER
45-21 mcg/actuation | Freq: Two times a day (BID) | RESPIRATORY_TRACT | Status: DC
Start: 2017-08-18 — End: 2017-08-20
  Administered 2017-08-18 – 2017-08-20 (×6): via RESPIRATORY_TRACT

## 2017-08-18 MED ORDER — PREGABALIN 75 MG CAP
75 mg | Freq: Three times a day (TID) | ORAL | Status: DC
Start: 2017-08-18 — End: 2017-08-18
  Administered 2017-08-18 (×3): via ORAL

## 2017-08-18 MED ORDER — HALOPERIDOL 5 MG TAB
5 mg | Freq: Two times a day (BID) | ORAL | Status: DC
Start: 2017-08-18 — End: 2017-08-20
  Administered 2017-08-18 – 2017-08-20 (×6): via ORAL

## 2017-08-18 MED ORDER — DIVALPROEX 500 MG TAB, DELAYED RELEASE
500 mg | Freq: Two times a day (BID) | ORAL | Status: DC
Start: 2017-08-18 — End: 2017-08-20
  Administered 2017-08-18 – 2017-08-20 (×6): via ORAL

## 2017-08-18 MED ORDER — SERTRALINE 50 MG TAB
50 mg | Freq: Every day | ORAL | Status: DC
Start: 2017-08-18 — End: 2017-08-17

## 2017-08-18 MED ORDER — PREGABALIN 50 MG CAP
50 mg | Freq: Three times a day (TID) | ORAL | Status: DC
Start: 2017-08-18 — End: 2017-08-20
  Administered 2017-08-19 – 2017-08-20 (×6): via ORAL

## 2017-08-18 MED FILL — BUPROPION 75 MG TAB: 75 mg | ORAL | Qty: 1

## 2017-08-18 MED FILL — DIPHENOXYLATE-ATROPINE 2.5 MG-0.025 MG TAB: ORAL | Qty: 1

## 2017-08-18 MED FILL — DIVALPROEX 500 MG TAB, DELAYED RELEASE: 500 mg | ORAL | Qty: 1

## 2017-08-18 MED FILL — ADVAIR HFA 45 MCG-21 MCG/ACTUATION AEROSOL INHALER: 45-21 mcg/actuation | RESPIRATORY_TRACT | Qty: 8

## 2017-08-18 MED FILL — LYRICA 50 MG CAPSULE: 50 mg | ORAL | Qty: 1

## 2017-08-18 MED FILL — HALOPERIDOL 5 MG TAB: 5 mg | ORAL | Qty: 1

## 2017-08-18 MED FILL — TAB-A-VITE TABLET: ORAL | Qty: 1

## 2017-08-18 MED FILL — NICOTINE 21 MG/24 HR DAILY PATCH: 21 mg/24 hr | TRANSDERMAL | Qty: 1

## 2017-08-18 MED FILL — LYRICA 50 MG CAPSULE: 50 mg | ORAL | Qty: 4

## 2017-08-18 MED FILL — HYDROXYZINE PAMOATE 50 MG CAP: 50 mg | ORAL | Qty: 1

## 2017-08-18 NOTE — Behavioral Health Treatment Team (Signed)
[facility]  INPATIENT BEHAVIORAL HEALTH  NURSING PROGRESS NOTE  Shivya Schram  08/18/2017      BEHAVIORAL ASSESSMENT    Patient is alert and orient x4. Clean, well-groomed, and appropriately dressed. Calm, friendly, and cooperative. Isolates to room and doesn't socialize with other patients. Complaints of pain in back rated 6/10; provided emotional support and relaxation techniques. Rates anxiety as 7/10 but denies depression at this time. No suicidal ideation, homicidal ideation, hallucinations, or aggressive behavior noted at this time. At 0209, patient came to nurse's station with complaint of headache rated 8/10. Called Dr. Doristine Locks and informed him of the patient's condition. Ordered Motrin 400 mg Q 6 hr PRN. Administered pain med. Compliant with bedtime meds. Resting in bed with bed in lowest position.    CIWA Scale: N/A  COW Scale: N/A  PAIN MEDICATION GIVEN THIS SHIFT; Motrin  PAIN ASSESSMENT AND REASSESSMENT COMPLETED: 6/10      Sleep Assessment  Hours nightly slept: 8-9hr  Difficulty Falling Asleep: No  Difficulty Staying Asleep: Yes  Broken Sleep: No    COLUMBIA-SUICIDE SEVERITY RATING SCALE  Daily/Shift Screen    Ask questions that are bold and underlined Since Last Asked   Ask Question 2* YES NO   2) Suicidal Thoughts:     Since you were last asked, have you actually had thoughts about killing yourself?   X   If YES to 2, ask questions 3, 4, 5, and 6.  If NO to 2, go directly to question 6   3) Suicidal Thoughts with Method (without Specific Plan or Intent to Act):     Have you been thinking about how you might do this?      4) Suicidal Intent (without Specific Plan):     Have you had these thoughts and had some intention of acting on them?      5) Suicide Intent with Specific Plan:     Have you started to work out or worked out the details of how to kill yourself? Do you intend to carry out this plan?      6) Suicide Behavior    Have you done anything, started to do anything, or  prepared to do anything to end your life?    Examples: Collected pills, obtained a gun, gave away valuables, wrote a will or suicide note, took out pills but didn't swallow any, held a gun but changed your mind or it was grabbed from your hand, went to the roof but didn't jump; or actually took pills, tried to shoot yourself, cut yourself, tried to hang yourself, etc.  If YES, what did you do?____________________________________________  _______________________________________________________________  [name]     * Note - for frequent assessment purposes, Question 1 has been omitted       NURSING GROUP:    TOPIC OF GROUP: Reflection  Leader of Group: Bari Edward  TIME: 7:00 - 7:30 pm  Patient Participation: Didn't attend; resting in bed        INTERVENTIONS      Treatment Plan Problem List & Correlating Goals/Interventions    Problem # Problem   1 Risk for Harm to Self   2 Depression   3 Anxiety   4 Paranoia       CLIENT RESPONSE TO TREATMENT    Patient is alert and orient x4. Clean, well-groomed, and appropriately dressed. Calm, friendly, and cooperative. Isolates to room and doesn't socialize with other patients. Complaints of pain in back rated 6/10; provided  emotional support and relaxation techniques. Rates anxiety as 7/10 but denies depression at this time. No suicidal ideation, homicidal ideation, hallucinations, or aggressive behavior noted at this time. At 0209, patient came to nurse's station with complaint of headache rated 8/10. Called Dr. Doristine Locks and informed him of the patient's condition. Ordered Motrin 400 mg Q 6 hr PRN. Administered pain med. Compliant with bedtime meds. Resting in bed with bed in lowest position.

## 2017-08-18 NOTE — Behavioral Health Treatment Team (Signed)
 [facility]  INPATIENT BEHAVIORAL HEALTH  NURSING PROGRESS NOTE  Angela Bishop  08/17/2017; 0500      BEHAVIORAL ASSESSMENT    Patient AAOx3.  Patient comfortably dressed and fairly groomed.  Patient maintains eye contact.  Patient interacts well with staff and other patients.  Patient rates anxiety as 7/10 and depression as 8/10.  Patient denies SI/HI/AVH.  Patient calm, cooperative and compliant with medications.      CIWA Scale: NA  COW Scale: NA  PAIN MEDICATION GIVEN THIS SHIFT: No  PAIN ASSESSMENT AND REASSESSMENT COMPLETED: Yes      Sleep Assessment  Hours nightly slept:   Difficulty Falling Asleep: No  Difficulty Staying Asleep: No  Broken Sleep: No  Clinician's Observations:  Patient appeared asleep at 1845 and was still sleeping at end of shift.      Appetite Assessment  Percentage of Patient's Food Consumption: 100%  Clinician's Observations:  Patient ate 100% of hs snack and tolerated well    COLUMBIA-SUICIDE SEVERITY RATING SCALE  Daily/Shift Screen    Ask questions that are bold and underlined Since Last Asked   Ask Question 2* YES NO   2)   Suicidal Thoughts:     Since you were last asked, have you actually had thoughts about killing yourself?   NO   If YES to 2, ask questions 3, 4, 5, and 6.  If NO to 2, go directly to question 6   3) Suicidal Thoughts with Method (without Specific Plan or Intent to Act):     Have you been thinking about how you might do this?      4) Suicidal Intent (without Specific Plan):     Have you had these thoughts and had some intention of acting on them?      5) Suicide Intent with Specific Plan:     Have you started to work out or worked out the details of how to kill yourself? Do you intend to carry out this plan?      6) Suicide Behavior    Have you done anything, started to do anything, or prepared to do anything to end your life?    Examples: Collected pills, obtained a gun, gave away valuables, wrote a will or suicide note, took out pills but  didn't swallow any, held a gun but changed your mind or it was grabbed from your hand, went to the roof but didn't jump; or actually took pills, tried to shoot yourself, cut yourself, tried to hang yourself, etc.  If YES, what did you do?____________________________________________  _______________________________________________________________  NO     * Note - for frequent assessment purposes, Question 1 has been omitted     If the patient came in suicidal and now denies ask the patient what has changed and list response:  Patient denies SI.      NURSING GROUP:    TOPIC OF GROUP:  Spirituality  Leader of Group:  Chaplain Phil  START TIME:  1900  STOP TIME:  2000  Patient Participation:  Patient attended  Describe Patient's involvement in group:  Patient participated    INTERVENTIONS      Treatment Plan Problem List & Correlating Goals/Interventions    Problem # Problem   1 Risk of harm to self   2 Depression   3 Anxiety   4 Paranoia           CLIENT RESPONSE TO TREATMENT    Describe Patient Response to [facility] Treatment  Plan's Goals/Interventions: Compliant  Patient denies SI.  Patient rates depression as 8/10 and anxiety as 7/10.  Patient voices no paranoia or delusions.

## 2017-08-18 NOTE — Behavioral Health Treatment Team (Signed)
Patient refused labs this morning.

## 2017-08-18 NOTE — Behavioral Health Treatment Team (Signed)
 [facility]  INPATIENT BEHAVIORAL HEALTH  NURSING PROGRESS NOTE  Angela Bishop  08/18/2017; 1515      BEHAVIORAL ASSESSMENT  Especially commenting on behaviors related to the admitting diagnosis.    Pt has been withdrawn to bed most of the day. Pt states the medications she is taking are making her sleepy. Pt is much calmer today, alert and oriented x 3 with good eye contact. Pt denies S/S, H/S, A/V/S and no delusional thinking is noted. Pt is clean and groomed and voicing no complaints.    CIWA Scale:  COW Scale:  PAIN MEDICATION GIVEN THIS SHIFT; none   PAIN ASSESSMENT AND REASSESSMENT COMPLETED: yes      Sleep Assessment  Hours nightly slept: 8  Difficulty Falling Asleep: No  Difficulty Staying Asleep: No  Broken Sleep: {YES/NO:none  Clinician's Observations (Be Descriptive): unremarkable     Appetite Assessment  Percentage of Patient's Food Consumption: 80%  Clinician's Observations (Be Descriptive): unremarkable    COLUMBIA-SUICIDE SEVERITY RATING SCALE  Daily/Shift Screen    Ask questions that are bold and underlined Since Last Asked   Ask Question 2* YES NO   2) Suicidal Thoughts:     Since you were last asked, have you actually had thoughts about killing yourself?   x   If YES to 2, ask questions 3, 4, 5, and 6.  If NO to 2, go directly to question 6   3) Suicidal Thoughts with Method (without Specific Plan or Intent to Act):     Have you been thinking about how you might do this?      4) Suicidal Intent (without Specific Plan):     Have you had these thoughts and had some intention of acting on them?      5) Suicide Intent with Specific Plan:     Have you started to work out or worked out the details of how to kill yourself? Do you intend to carry out this plan?      6) Suicide Behavior    Have you done anything, started to do anything, or prepared to do anything to end your life?    Examples: Collected pills, obtained a gun, gave away valuables, wrote a will or suicide note, took  out pills but didn't swallow any, held a gun but changed your mind or it was grabbed from your hand, went to the roof but didn't jump; or actually took pills, tried to shoot yourself, cut yourself, tried to hang yourself, etc.  If YES, what did you do?____________________________________________  _______________________________________________________________  x     * Note - for frequent assessment purposes, Question 1 has been omitted           If the patient came in suicidal and now denies ask the patient what has changed and list response: Pt denies suicidal thoughts.             INTERVENTIONS      Treatment Plan Problem List & Correlating Goals/Interventions    Problem # Problem   1 Risk of harm to self   2 depression   3 anxiety   4 paranoia           CLIENT RESPONSE TO TREATMENT    Describe Patient Response to Master Treatment Plan's Goals/Interventions: Pt denies suicidal thoughts and fifteen minute rounds are being done. Pt rates depression 5/10 and anxiety 6/10.

## 2017-08-18 NOTE — Behavioral Health Treatment Team (Signed)
Psychiatry Progress Note    Psychiatric Progress Note of Tele-Psych MD - Marland Kitchen    Date: 08/18/2017  Account Number:  [id number]  Name: Angela Bishop  Length of session: 35 minutes    Patient Seen and Examined by [facility] licensed, Tele-psychiatrist, Marland Kitchen, MD from state of [place], via [facility] video conference system with RN     Patient's Clinical History of Present Illness and Chart Reviewed  SUBJECTIVE:  "I feel so tired still. So, when am I going home? "      32 y.o.,female, known to have bipolar d/o, complicated by opiate dependence; and supposedly in a suboxone MAT clinic at 16mg -4mg /day.      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 Pt currently presenting with clear clinical evidence of stimulant related w/drawals.      No clear evidence of opiate/suboxone related w/drawals at this time.      NO urine buprenorphine testing done at this time.            > unclear if pt is DIVERTING suboxone.      Per RN - pt with no behavioral/mgt concerns at this time.     NOTE the following clinical data points to be considered for differential diagnoses and development of treatment plan/recommendations:   Patient Active Problem List    Diagnosis Date Noted   ??? Bipolar disorder with psychotic features (HCC) 08/17/2017   ??? Suicidal ideations 08/16/2017   ??? Suicidal ideation 08/16/2017     Past Surgical History:   Procedure Laterality Date   ??? ABDOMEN SURGERY PROC UNLISTED     ??? HX APPENDECTOMY     ??? HX LAP CHOLECYSTECTOMY        Allergies   Allergen Reactions   ??? Fentanyl Swelling   ??? Reglan [Metoclopramide] Other (comments)   ??? Toradol [Ketorolac] Swelling   ??? Ultram [Tramadol] Nausea and Vomiting   ??? Zofran [Ondansetron Hcl] Other (comments)      Social History     Tobacco Use   ??? Smoking status: Current Every Day Smoker     Packs/day: 1.00   ??? Smokeless tobacco: Never Used   Substance Use Topics   ??? Alcohol use: Not Currently      Family History   Problem Relation Age of Onset   ??? Hypertension  Mother    ??? Heart Disease Mother    ??? Diabetes Mother    ??? Cancer Father    ??? Asthma Brother    ??? Heart Disease Paternal Grandmother    ??? Heart Disease Paternal Grandfather       Review of Medical/Psychiatric Systems  A comprehensive review of systems was negative except for that written in the HPI.    OBJECTIVE: Pt with no cardio-pulmonary distress; and with no urgent neurological deficits   Mental Status exam:   Sensorium  oriented to time, place and person   Orientation person, place, time/date, situation, day of week, month of year and year   Relations passive   Eye Contact poor   Appearance:  disheveled   Motor Behavior:  hypoactive and gait stable   Speech:  normal pitch, non-pressured and soft   Vocabulary below average   Thought Process: goal directed   Thought Content free of delusions, free of hallucinations and not internally preoccupied    Suicidal ideations none   Homicidal ideations none   Mood:    "I feel very tired"   Affect:  blunted   Memory recent  Memory remote:     " I can't do that... I feel tired"   Concentration:     Abstraction:     Insight:  limited   Reliability poor   Judgment:  limited     Patient Vitals for the past 8 hrs:   BP Weight   08/18/17 0832 97/58 ???   08/18/17 0829 ??? 59.4 kg (131 lb)     Lab/Data Review:  All lab results for the last 24 hours reviewed.    Medications:  Current Facility-Administered Medications   Medication Dose Route Frequency   ??? haloperidol (HALDOL) tablet 5 mg  5 mg Oral BID   ??? pregabalin (LYRICA) capsule 200 mg  200 mg Oral TID   ??? fluticasone salmeterol (ADVAIR HFA) 39mcg-21mcg/puff  2 Puff Inhalation BID   ??? divalproex DR (DEPAKOTE) tablet 500 mg  500 mg Oral BID   ??? buPROPion (WELLBUTRIN) tablet 37.5 mg  37.5 mg Oral BID   ??? aluminum & magnesium hydroxide-simethicone (MYLANTA II) oral suspension 30 mL  30 mL Oral Q4H PRN   ??? hydrOXYzine pamoate (VISTARIL) capsule 50 mg  50 mg Oral QID PRN   ??? magnesium hydroxide (MILK OF MAGNESIA) concentrated oral  suspension 5 mL  5 mL Oral Q6H PRN   ??? multivitamin (ONE A DAY) tablet 1 Tab  1 Tab Oral DAILY   ??? nicotine (NICODERM CQ) 21 mg/24 hr patch 1 Patch  1 Patch TransDERmal DAILY PRN   ??? diphenoxylate-atropine (LOMOTIL) tablet 1 Tab  1 Tab Oral QID PRN     Side Effects:  none    Assessment/Plan:   DIAGNOSIS: Suicidal ideations [R45.851]  Suicidal ideation [R45.851]  I.   NO change in dx'tic impressions at this time           > pt still with MILD crystal meth w/drawals - (CRASH) - improving           > no evidence of opiate/suboxone related w/drawals at this time.   II.  NO change in current medication regimen, and treatment plan at this time.   III. Pt in still a high risk for quick relapse at this time leading to imminent risks towards dangerousness to self/others  IV.  Discussed with pt re: following (pt expresses no interest with topics below - pt at high risk for quick relapse if d/c'ed out of IP setting to non-structured mh/SA aftercare):               - insight/motivation towards sobriety              - learned skills/tools to be implemented, AND ability to implement identified skills tools when pt has urged to use/relapse              - identify and develop plans towards TRIGGERS to relapse AND, BARRIERS to sobriety/adherence to mh/SA follow-up              - participation with community based AA/NA groups              - ability to engage AA/NA sponsor              - if not yet done, promote family/support systems towards in ALANON/NA-ANON              - explore risks/benefits of MAT with opiate / ETOH use disorder.  V.  Unclear if pt is benefiting with suboxone MAT. Pt clearly abusing illicit crystal meth even while supposedly involved  with Suboxone MAT clinic          > collaborate with suboxone OP providers.           > CONSIDER to check urine Buprenorphine. IF NEGATIVE, explore if pt is diverting rx'ed suboxone.     Signed By: Georges LynchMitchell R Kho, MD

## 2017-08-18 NOTE — Behavioral Health Treatment Team (Signed)
[facility]  INPATIENT [facility]HAV[facility]AL HEALTH  NURSING PROGRESS NOTE  Sanjuana LettersStephenie Baumgartner  08/17/2017; 0500      BEHAVIORAL ASSESSMENT    Patient AAOx3.  Patient comfortably dressed and fairly groomed.  Patient maintains eye contact.  Patient interacts well with staff and other patients.  Patient rates anxiety as 7/10 and depression as 8/10.  Patient denies SI/HI/AVH.  Patient calm, cooperative and compliant with medications.      CIWA Scale: NA  COW Scale: NA  PAIN MEDICATION GIVEN THIS SHIFT: No  PAIN ASSESSMENT AND REASSESSMENT COMPLETED: Yes      Sleep Assessment  Hours nightly slept:   Difficulty Falling Asleep: No  Difficulty Staying Asleep: No  Broken Sleep: No  Clinician's Observations:  Patient appeared asleep at 1845 and was still sleeping at end of shift.      Appetite Assessment  Percentage of Patient's Food Consumption: 100%  Clinician's Observations:  Patient ate 100% of hs snack and tolerated well    COLUMBIA-SUICIDE SEVERITY RATING SCALE  Daily/Shift Screen    Ask questions that are bold and underlined Since Last Asked   Ask Question 2* YES NO   2)   Suicidal Thoughts:     Since you were last asked, have you actually had thoughts about killing yourself?   NO   If YES to 2, ask questions 3, 4, 5, and 6.  If NO to 2, go directly to question 6   3) Suicidal Thoughts with Method (without Specific Plan or Intent to Act):     Have you been thinking about how you might do this?      4) Suicidal Intent (without Specific Plan):     Have you had these thoughts and had some intention of acting on them?      5) Suicide Intent with Specific Plan:     Have you started to work out or worked out the details of how to kill yourself? Do you intend to carry out this plan?      6) Suicide Behavior    Have you done anything, started to do anything, or prepared to do anything to end your life?    Examples: Collected pills, obtained a gun, gave away valuables, wrote a  will or suicide note, took out pills but didn???t swallow any, held a gun but changed your mind or it was grabbed from your hand, went to the roof but didn???t jump; or actually took pills, tried to shoot yourself, cut yourself, tried to hang yourself, etc.  If YES, what did you do?____________________________________________  _______________________________________________________________  NO     * Note ??? for frequent assessment purposes, Question 1 has been omitted     If the patient came in suicidal and now denies ask the patient what has changed and list response:  Patient denies SI.      NURSING GROUP:    TOPIC OF GROUP:  Spirituality  Leader of Group:  Chaplain Phil  START TIME:  1900  STOP TIME:  2000  Patient Participation:  Patient attended  Describe Patient's involvement in group:  Patient participated    INTERVENTIONS      Treatment Plan Problem List & Correlating Goals/Interventions    Problem # Problem   1 Risk of harm to self   2 Depression   3 Anxiety   4 Paranoia           CLIENT RESPONSE TO TREATMENT    Describe Patient Response to Master Treatment  Plan's Goals/Interventions: Compliant  Patient denies SI.  Patient rates depression as 8/10 and anxiety as 7/10.  Patient voices no paranoia or delusions.

## 2017-08-18 NOTE — Behavioral Health Treatment Team (Signed)
[facility]  INPATIENT BEHAVIORAL HEALTH  NURSING PROGRESS NOTE  Angela Bishop  08/18/2017      BEHAVIORAL ASSESSMENT    Patient is alert and orient x4. Clean, well-groomed, and appropriately dressed. Calm, friendly, and cooperative. Isolates to room and doesn't socialize with other patients. Complaints of pain in back rated 6/10; provided emotional support and relaxation techniques. Rates anxiety as 7/10 but denies depression at this time. No suicidal ideation, homicidal ideation, hallucinations, or aggressive behavior noted at this time. At 0209, patient came to nurse's station with complaint of headache rated 8/10. Called Dr. Doristine Locks and informed him of the patient's condition. Ordered Motrin 400 mg Q 6 hr PRN. Administered pain med. Compliant with bedtime meds. Resting in bed with bed in lowest position.    CIWA Scale: N/A  COW Scale: N/A  PAIN MEDICATION GIVEN THIS SHIFT; Motrin  PAIN ASSESSMENT AND REASSESSMENT COMPLETED: 6/10      Sleep Assessment  Hours nightly slept: 8-9hr  Difficulty Falling Asleep: No  Difficulty Staying Asleep: Yes  Broken Sleep: No    COLUMBIA-SUICIDE SEVERITY RATING SCALE  Daily/Shift Screen    Ask questions that are bold and underlined Since Last Asked   Ask Question 2* YES NO   2) Suicidal Thoughts:     Since you were last asked, have you actually had thoughts about killing yourself?   X   If YES to 2, ask questions 3, 4, 5, and 6.  If NO to 2, go directly to question 6   3) Suicidal Thoughts with Method (without Specific Plan or Intent to Act):     Have you been thinking about how you might do this?      4) Suicidal Intent (without Specific Plan):     Have you had these thoughts and had some intention of acting on them?      5) Suicide Intent with Specific Plan:     Have you started to work out or worked out the details of how to kill yourself? Do you intend to carry out this plan?      6) Suicide Behavior     Have you done anything, started to do anything, or prepared to do anything to end your life?    Examples: Collected pills, obtained a gun, gave away valuables, wrote a will or suicide note, took out pills but didn???t swallow any, held a gun but changed your mind or it was grabbed from your hand, went to the roof but didn???t jump; or actually took pills, tried to shoot yourself, cut yourself, tried to hang yourself, etc.  If YES, what did you do?____________________________________________  _______________________________________________________________  [name]     * Note ??? for frequent assessment purposes, Question 1 has been omitted       NURSING GROUP:    TOPIC OF GROUP: Reflection  Leader of Group: Biomedical engineer  TIME: 7:00 - 7:30 pm  Patient Participation: Didn't attend; resting in bed        INTERVENTIONS      Treatment Plan Problem List & Correlating Goals/Interventions    Problem # Problem   1 Risk for Harm to Self   2 Depression   3 Anxiety   4 Paranoia       CLIENT RESPONSE TO TREATMENT    Patient is alert and orient x4. Clean, well-groomed, and appropriately dressed. Calm, friendly, and cooperative. Isolates to room and doesn't socialize with other patients. Complaints of pain in back rated 6/10; provided  emotional support and relaxation techniques. Rates anxiety as 7/10 but denies depression at this time. No suicidal ideation, homicidal ideation, hallucinations, or aggressive behavior noted at this time. At 0209, patient came to nurse's station with complaint of headache rated 8/10. Called Dr. Doristine LocksPunzal and informed him of the patient's condition. Ordered Motrin 400 mg Q 6 hr PRN. Administered pain med. Compliant with bedtime meds. Resting in bed with bed in lowest position.

## 2017-08-18 NOTE — Behavioral Health Treatment Team (Signed)
[facility]  [facility]PAT[facility]  NURSING PROGRESS NOTE  Sanjuana LettersStephenie Grenda  08/18/2017; 1515      BEHAVIORAL ASSESSMENT  Especially commenting on behaviors related to the admitting diagnosis.    Pt has been withdrawn to bed most of the day. Pt states the medications she is taking are making her sleepy. Pt is much calmer today, alert and oriented x 3 with good eye contact. Pt denies S/S, H/S, A/V/S and no delusional thinking is noted. Pt is clean and groomed and voicing no complaints.    CIWA Scale:  COW Scale:  PAIN MEDICATION GIVEN THIS SHIFT; none   PAIN ASSESSMENT AND REASSESSMENT COMPLETED: yes      Sleep Assessment  Hours nightly slept: 8  Difficulty Falling Asleep: No  Difficulty Staying Asleep: No  Broken Sleep: {YES/NO:none  Clinician's Observations (Be Descriptive): unremarkable     Appetite Assessment  Percentage of Patient's Food Consumption: 80%  Clinician's Observations (Be Descriptive): unremarkable    COLUMBIA-SUICIDE SEVERITY RATING SCALE  Daily/Shift Screen    Ask questions that are bold and underlined Since Last Asked   Ask Question 2* YES NO   2) Suicidal Thoughts:     Since you were last asked, have you actually had thoughts about killing yourself?   x   If YES to 2, ask questions 3, 4, 5, and 6.  If NO to 2, go directly to question 6   3) Suicidal Thoughts with Method (without Specific Plan or Intent to Act):     Have you been thinking about how you might do this?      4) Suicidal Intent (without Specific Plan):     Have you had these thoughts and had some intention of acting on them?      5) Suicide Intent with Specific Plan:     Have you started to work out or worked out the details of how to kill yourself? Do you intend to carry out this plan?      6) Suicide Behavior    Have you done anything, started to do anything, or prepared to do anything to end your life?    Examples: Collected pills, obtained a gun, gave away valuables, wrote a  will or suicide note, took out pills but didn???t swallow any, held a gun but changed your mind or it was grabbed from your hand, went to the roof but didn???t jump; or actually took pills, tried to shoot yourself, cut yourself, tried to hang yourself, etc.  If YES, what did you do?____________________________________________  _______________________________________________________________  x     * Note ??? for frequent assessment purposes, Question 1 has been omitted           If the patient came in suicidal and now denies ask the patient what has changed and list response: Pt denies suicidal thoughts.             INTERVENTIONS      Treatment Plan Problem List & Correlating Goals/Interventions    Problem # Problem   1 Risk of harm to self   2 depression   3 anxiety   4 paranoia           CLIENT RESPONSE TO TREATMENT    Describe Patient Response to Master Treatment Plan's Goals/Interventions: Pt denies suicidal thoughts and fifteen minute rounds are being done. Pt rates depression 5/10 and anxiety 6/10.

## 2017-08-18 NOTE — Treatment Summary (Signed)
[facility]  [facility]  Inpatient Behavioral Health Services  Master Treatment Plan      INPATIENT MASTER TREATMENT PLAN   Date of Adm:  08/16/2017       Signature/Credential of Staff Initiating:    Vihaan Gloss L. Willa RoughHicks, [facility]   Date Plan Initiated:    08/16/2017 MTP plan initated date/time:    08/18/2017 Anticipated Date of Discharge:    08/21/2017 Legal Status:  []  - Voluntary  [x]  - Involuntary    Expires: 08/21/2017 @ 6:45 PM   DIAGNOSIS   1. Bipolar disorder with psychotic features;   2. stimulant use disorder, severe;   3. unspecified anxiety disorder, possibly panic disorder.1.    ASSESSMENTS     Depression: 10/10  Anxiety: 9/10  Suicide Risk Score: High  Violence/Aggression Risk Score: Low  Alcohol Audit Score: 0     INVENTORY OF PATIENT STRENGTHS / LIMITATIONS       (At least 2 of each)S = Strength L = Limitation)   S L Item S L Item S L Item   [x]  []  Verbal Expression []  [x]  Family Support [x]  []  Financial Status   [x]  []  Physical Status []  [x]  Social Supports []  [x]  Employment Status   []  [x]  Degree of Insight []  [x]  Education/Cognition []  [x]  Prior Response to Treatment   []  []  Ambulation/Transportation []  []  Motivation []  []  Other:    DISCHARGE PLAN        []  Home alone   ??  []  Home with family ?????  ??  []  Home other than family ?????  ??  []  Group home ?????  ??  []  Assisted apartment living ?????  ??  []  Nursing home ?????  ??  []  Rest home ?????  ??  []  Assisted living ?????  ??  []  Family care home ?????  ??  []  Homeless shelter ?????  ??  []  Residential treatment ?????  ??  []  Shelter: ?????  ??  []  Correctional Facility: ?????  ??  []  Other hospital: ????? ??  []  [facility]  ??  []  Day treatment at ?????  ??  [x]  Individual therapy with ?TBD????  ??  []  Family therapy with ?????  ??  []  Case management with ?????  ??  [x]  Substance abuse treatment  TBD  ??  []  Respite ?????  ??  []  Mental health center  ??  []  Vocational rehabilitation ?????  ??   []  Medication management with Dr. ?????  ??  []  Recovery Support Groups: ?????  ??  []  Other: ????? ??  []  Home health with ?????  ??  []  Home medical equipment ?????  ??  []  Medical services with Dr. ?????  ??  []  Nutritional needs : ?????  ??  []  Other Referrals:   ??     ??  Other Community Resource Needs/Referrals: (Describe Below):  ??  []  Educational needs :    ??  []  Recreational/Social Referrals: ?????     PROBLEM LIST    Problem  Number PROBLEM  Include Significant Medical Issues Date Established Status As Evidenced by:         A = Active  D = Deferred  I = Inactive  R = Resolved  M = Monitoring    1 Risk of Harm to Self 08/18/2017 A As evidenced by, pt report of suicidal ideations with plan to cut her wrists with report of 42 suicidal overdoses in lifetime. She  reports handing over knife to her counselor prior to coming to emergency room   2 Depression 08/18/2017 A As evidenced by, patient report of depressive symptoms self rated 10/10   3 Anxiety 08/18/2017 A As evidenced by, patient report of anxious symptoms self rated 9/10   4 Mood Disorder 08/18/2017 A As evidenced by, patient report of mood swings and racing thoughts   5 Psychosis 08/18/2017 A As evidenced by, patient report of paranoia and sleeping with knife   6 Seizure Risk 08/18/2017 A As evidenced by, pt history of seizure disorder   7 Chemical Dependence 08/18/2017 A As evidenced by, patient report of suboxone use for past 6 years   8 Chemical Withdrawal 08/18/2017 A As evidenced by, patient report of current withdrawal symptoms from suboxone      Problem  Number Goals   Interventions/Modality Target Date/Progression           1 LTG: Pt will express no suicidal ideation or intent by day of discharge.   Target Date: 08/24/2017      STG-Patient will attend daily group activities in order to enhance positive interactions with self and others.  [x]   Nursing will provide at least 1 educational group per day for 50 minutes.   [x]   Recreation will provide at least 1 recreational group per day for 50 minutes.  [x]   Therapy will provide at least 1 therapy group session per day for 50 minutes  [x]   PCT will provide at least 1 group session per day for 50 minutes.  [x]   Nursing staff will offer a Daily wrap up/reflection group for at least 30 minutes a day.    Target Date: 08/21/2017    Progression: N/A      STG- Patient will complete a safety plan by day of discharge, which includes identifying support persons to contact  [x]   Therapy will assist pt in identifying appropriate follow up plan that includes a positive support network  [x]   Therapy will complete family conference      Target Date: 08/21/2017    Progression: N/A     2 LTG:  Patient's depressed mood will be improved/stabilized prior to discharge Target Date: 08/24/2017      STG-Patient will comply with medication regimen as prescribed by physician every day  [x]   Nursing will administer and monitor medications  [x]   Physician will prescribe medications as needed based on daily evaluations. Target Date: 08/21/2017    Progression: N/A        STG- Patient will attend and participate in scheduled group activities  [x]   Nursing will provide at least 1 educational group per day for 50 minutes.  [x]   Recreation will provide at least 1 recreational group per day for 50 minutes.  [x]   Therapy will provide at least 1 therapy group session per day for 50 minutes to address healthy coping skills for unhealthy emotions  [x]   Nursing staff will offer a Daily wrap up/reflection group for at least 30 minutes a day.   Target Date: 08/21/2017    Progression: N/A     3 LTG:  Patient's anxiety will be improved/stabilized prior to discharge Target Date: 08/24/2017   ??  ?? STG-Patient will comply with medication regimen as prescribed by physician every day  [x]   Nursing will administer and monitor medications  [x]   Physician will prescribe medications as needed based on daily  evaluations. Target Date: 08/21/2017    Progression: N/A  ??   ??  ?? STG-  Patient will describe situations, thoughts,feelings, and actions associated with anxieties and worries, their impact on functioning, and  attempt to resolve them. [x]   Nursing will provide at least 1 educational group per day for 50 minutes.  [x]   Recreation will provide at least 1 recreational group per day for 50 minutes to increase socialization and decrease social anxiety  [x]   Therapy will provide at least 1 therapy group session per day for 50 minutes to teach calming/  relaxation skills (e.g., applied  relaxation, progressive muscle  relaxation, cue controlled  relaxation; mindful breathing;  biofeedback) Target Date: 08/21/2017    Progression: N/A     4 LTG:  Patient's depressed and anxious mood will be stabilized or diminished to a degree that can be treated on an outpatient basis Target Date: 08/24/2017   ??  ??   STG-Patient will take all medications as prescribed and report any adverse side effects to nursing staff immediately   [x]   Nursing will administer and monitor medications  [x]   Physician will prescribe medications as needed based on daily evaluations. Target Date: 08/21/2017    Progression: N/A   ??  ?? STG- Patient will attend and participate in scheduled group activities  [x]   Nursing will provide at least 1 educational group per day for 50 minutes.  [x]   Recreation will provide at least 1 recreational group per day for 50 minutes.  [x]   Therapy will provide at least 1 therapy group session per day for 50 minutes to address healthy coping skills for unhealthy emotions  [x]   Nursing staff will offer a Daily wrap up/reflection group for at least 30 minutes a day. Target Date: 08/21/2017    Progression: N/A     5 LTG: Patient will eliminate or significantly decrease acute, reactive, psychotic symptoms and return to baseline functioning by day of discharge. ?? Target Date: 08/24/2017   ??   ???? STG: Patient will report a decrease in intensity and frequency of psychosis ??  [x] ????Nursing will administer and monitor medications  [x] ????Physician will prescribe medications as needed based on daily evaluations.  [x] ??Therapy will evaluate patient's mental status during group and individual interactions   Target Date: 08/21/2017    Progression: N/A   ?? STG: ??Patient will take prescribed medications consistently with supervision by day 3. ??  [x] ????Nursing staff will monitor symptoms and provide medical interventions when needed.   [x] ????Physician will prescribe medications as needed based on daily evaluations.  [x] ????Nursing staff will complete 15 minute checks on pt. Target Date: 08/21/2017    Progression: N/A     6 LTG:  Pt will be seizure free for 24 hours prior to discharge. Target Date: 08/24/2017      STG: Patient will take medications as prescribed [x]   Nursing will educate patient on the risk of seizures during withdrawal  [x]   Nursing staff will complete 15 minute checks on pt.  [x]   Physician will prescribe medications as needed based on daily evaluations.  [x]   Physician will put pt on seizure precautions.  [x]   Nursing staff will monitor symptoms and provide medical interventions when needed.   Target Date: 08/21/2017    Progression: N/A      STG: Patient will report all known seizure related symptoms to staff immediately [x]   Nursing will educate patient on physiological signs and symptoms of seizures   [x]   Nursing staff will monitor symptoms and provide medical interventions when needed.  [x]   Nursing staff will complete 15 minute checks on pt.  [  x]  Therapy staff will monitor symptoms during group and individual sessions and notify nursing staff immediately Target Date: 08/21/2017    Progression: N/A     7 LTG: Pt will develop an effective continuing recovery plan prior to discharge Target Date: 08/24/2017      STG-Patient will participate fully in discharge planning that addresses a  sober lifestyle  [x]   Nursing will provide at least 1 educational group per day for 50 minutes that addresses effective discharge planning  [x]   Recreation will provide at least 1 recreational group per day for 50 minutes that promotes socialization skill building to promote sober living activities  [x]   Therapy will provide at least 1 therapy group session per day for 50 minutes that addresses triggers, slippery people, places and things, meetings, relationship management, relapse prevention, housing, recreation and step work.   [x]   Therapy will work with patient to obtain releases for appropriate drug/alcohol treatment referrals. Target Date: 08/21/2017    Progression: N/A      STG- Patient will take all medications as prescribed and notify staff immediately of any adverse side effects [x]   Nursing will administer and monitor medications  [x]   Physician will prescribe medications as needed based on daily evaluations.  [x]   Nursing staff will complete 15 minute checks on pt to monitor   [x]   Nursing staff will monitor symptoms and provide medical interventions when needed.  []   Other:  Target Date: 08/21/2017    Progression: N/A     8 LTG: Pt will show no signs or symptoms of withdrawal by day of discharge Target Date: 08/24/2017   ??  ?? STG-Patient's CIWA score will decrease back to baseline level  [x]   Nursing staff will monitor symptoms and provide medical interventions when needed.  [x]   Physician will prescribe medications as needed based on daily evaluations. Target Date: 08/21/2017    Progression: N/A   ??  ?? STG- Patient will take all medications as prescribed and notify staff immediately of any adverse side effects [x]   Nursing will administer and monitor medications  [x]   Physician will prescribe medications as needed based on daily evaluations.  [x]   Nursing staff will complete 15 minute checks on pt to monitor   [x]   Nursing staff will monitor symptoms and provide medical interventions when needed.   []   Other:  Target Date: 08/21/2017    Progression: N/A   ??    PATIENT INVOLVEMENT IN TREATMENT PLAN      Treatment Plan was developed with input from and discussed with patient/guardian/conservator/designee?  []  - Yes, completed with staff during individual session  [x]  - Yes, completed during treatment planning team meet  []  - No, please explain:        FAMILY/SIGNIFICANT OTHER INVOLVEMENT IN TREATMENT PLAN      Was family involved in the development of treatment plan:    []  - Yes, family is involved  [x]  - No, Family not involved  []  - No known family  []  - Patient does not wish family involvement, explain     If family participates in care and treatment and shared their goal for patient, please write it here:

## 2017-08-18 NOTE — Behavioral Health Treatment Team (Signed)
Patient refused labs this morning

## 2017-08-18 NOTE — Behavioral Health Treatment Team (Signed)
Psychiatry Progress Note    Psychiatric Progress Note of Tele-Psych MD - Marland KitchenMitchell Gretchen Weinfeld    Date: 08/18/2017  Account Number:  [id number]  Name: Angela Bishop  Length of session: 35 minutes    [facility]rist, Marland KitchenMitchell Joannah Gitlin, MD from state of [place], via [facility] video conference system with RN     Patient's Clinical History of Present Illness and Chart Reviewed  SUBJECTIVE:  "I feel so tired still. So, when am I going home? "      32 y.o.,female, known to have bipolar d/o, complicated by opiate dependence; and supposedly in a suboxone MAT clinic at 16mg -4mg /day.      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 Pt currently presenting with clear clinical evidence of stimulant related w/drawals.      No clear evidence of opiate/suboxone related w/drawals at this time.      NO urine buprenorphine testing done at this time.            > unclear if pt is DIVERTING suboxone.      Per RN - pt with no behavioral/mgt concerns at this time.     NOTE the following clinical data points to be considered for differential diagnoses and development of treatment plan/recommendations:   Patient Active Problem List    Diagnosis Date Noted   ??? Bipolar disorder with psychotic features (HCC) 08/17/2017   ??? Suicidal ideations 08/16/2017   ??? Suicidal ideation 08/16/2017     Past Surgical History:   Procedure Laterality Date   ??? ABDOMEN SURGERY PROC UNLISTED     ??? HX APPENDECTOMY     ??? HX LAP CHOLECYSTECTOMY        Allergies   Allergen Reactions   ??? Fentanyl Swelling   ??? Reglan [Metoclopramide] Other (comments)   ??? Toradol [Ketorolac] Swelling   ??? Ultram [Tramadol] Nausea and Vomiting   ??? Zofran [Ondansetron Hcl] Other (comments)      Social History     Tobacco Use   ??? Smoking status: Current Every Day Smoker     Packs/day: 1.00   ??? Smokeless tobacco: Never Used   Substance Use Topics   ??? Alcohol use: Not Currently      Family History   Problem Relation Age of Onset    ??? Hypertension Mother    ??? Heart Disease Mother    ??? Diabetes Mother    ??? Cancer Father    ??? Asthma Brother    ??? Heart Disease Paternal Grandmother    ??? Heart Disease Paternal Grandfather       Review of Medical/Psychiatric Systems  A comprehensive review of systems was negative except for that written in the HPI.    OBJECTIVE: Pt with no cardio-pulmonary distress; and with no urgent neurological deficits   Mental Status exam:   Sensorium  oriented to time, place and person   Orientation person, place, time/date, situation, day of week, month of year and year   Relations passive   Eye Contact poor   Appearance:  disheveled   Motor Behavior:  hypoactive and gait stable   Speech:  normal pitch, non-pressured and soft   Vocabulary below average   Thought Process: goal directed   Thought Content free of delusions, free of hallucinations and not internally preoccupied    Suicidal ideations none   Homicidal ideations none   Mood:    "I feel very tired"   Affect:  blunted   Memory recent  Memory remote:     " I can't do that... I feel tired"   Concentration:     Abstraction:     Insight:  limited   Reliability poor   Judgment:  limited     Patient Vitals for the past 8 hrs:   BP Weight   08/18/17 0832 97/58 ???   08/18/17 0829 ??? 59.4 kg (131 lb)     Lab/Data Review:  All lab results for the last 24 hours reviewed.    Medications:  Current Facility-Administered Medications   Medication Dose Route Frequency   ??? haloperidol (HALDOL) tablet 5 mg  5 mg Oral BID   ??? pregabalin (LYRICA) capsule 200 mg  200 mg Oral TID   ??? fluticasone salmeterol (ADVAIR HFA) 70mcg-21mcg/puff  2 Puff Inhalation BID   ??? divalproex DR (DEPAKOTE) tablet 500 mg  500 mg Oral BID   ??? buPROPion (WELLBUTRIN) tablet 37.5 mg  37.5 mg Oral BID   ??? aluminum & magnesium hydroxide-simethicone (MYLANTA II) oral suspension 30 mL  30 mL Oral Q4H PRN   ??? hydrOXYzine pamoate (VISTARIL) capsule 50 mg  50 mg Oral QID PRN    ??? magnesium hydroxide (MILK OF MAGNESIA) concentrated oral suspension 5 mL  5 mL Oral Q6H PRN   ??? multivitamin (ONE A DAY) tablet 1 Tab  1 Tab Oral DAILY   ??? nicotine (NICODERM CQ) 21 mg/24 hr patch 1 Patch  1 Patch TransDERmal DAILY PRN   ??? diphenoxylate-atropine (LOMOTIL) tablet 1 Tab  1 Tab Oral QID PRN     Side Effects:  none    Assessment/Plan:   DIAGNOSIS: Suicidal ideations [R45.851]  Suicidal ideation [R45.851]  I.   NO change in dx'tic impressions at this time           > pt still with MILD crystal meth w/drawals - (CRASH) - improving           > no evidence of opiate/suboxone related w/drawals at this time.   II.  NO change in current medication regimen, and treatment plan at this time.   III. Pt in still a high risk for quick relapse at this time leading to imminent risks towards dangerousness to self/others  IV.  Discussed with pt re: following (pt expresses no interest with topics below - pt at high risk for quick relapse if d/c'ed out of IP setting to non-structured mh/SA aftercare):               - insight/motivation towards sobriety              - learned skills/tools to be implemented, AND ability to implement identified skills tools when pt has urged to use/relapse              - identify and develop plans towards TRIGGERS to relapse AND, BARRIERS to sobriety/adherence to mh/SA follow-up              - participation with community based AA/NA groups              - ability to engage AA/NA sponsor              - if not yet done, promote family/support systems towards in ALANON/NA-ANON              - explore risks/benefits of MAT with opiate / ETOH use disorder.  V.  Unclear if pt is benefiting with suboxone MAT. Pt clearly abusing illicit crystal meth even while supposedly involved  with Suboxone MAT clinic          > collaborate with suboxone OP providers.           > CONSIDER to check urine Buprenorphine. IF NEGATIVE, explore if pt is diverting rx'ed suboxone.     Signed By: Georges LynchMitchell R Hiedi Touchton, MD

## 2017-08-19 LAB — ANTIDEPRESSANT PANEL, UR: Tricyclic Antidepressants: NEGATIVE ng/mL

## 2017-08-19 MED ORDER — TRAZODONE 150 MG TAB
150 mg | Freq: Every evening | ORAL | Status: DC | PRN
Start: 2017-08-19 — End: 2017-08-20
  Administered 2017-08-19 – 2017-08-20 (×2): via ORAL

## 2017-08-19 MED ORDER — IBUPROFEN 200 MG TAB
200 mg | Freq: Four times a day (QID) | ORAL | Status: DC | PRN
Start: 2017-08-19 — End: 2017-08-20
  Administered 2017-08-19 – 2017-08-20 (×3): via ORAL

## 2017-08-19 MED FILL — LYRICA 50 MG CAPSULE: 50 mg | ORAL | Qty: 4

## 2017-08-19 MED FILL — TRAZODONE 150 MG TAB: 150 mg | ORAL | Qty: 1

## 2017-08-19 MED FILL — HYDROXYZINE PAMOATE 50 MG CAP: 50 mg | ORAL | Qty: 1

## 2017-08-19 MED FILL — DIPHENOXYLATE-ATROPINE 2.5 MG-0.025 MG TAB: ORAL | Qty: 1

## 2017-08-19 MED FILL — IBUPROFEN 200 MG TAB: 200 mg | ORAL | Qty: 2

## 2017-08-19 MED FILL — BUPROPION 75 MG TAB: 75 mg | ORAL | Qty: 1

## 2017-08-19 MED FILL — HALOPERIDOL 5 MG TAB: 5 mg | ORAL | Qty: 1

## 2017-08-19 MED FILL — DIVALPROEX 500 MG TAB, DELAYED RELEASE: 500 mg | ORAL | Qty: 1

## 2017-08-19 MED FILL — TAB-A-VITE TABLET: ORAL | Qty: 1

## 2017-08-19 NOTE — Behavioral Health Treatment Team (Signed)
Psychiatry Progress Note    Psychiatric Progress Note of Tele-Psych MD - Marland Kitchen    Date: 08/19/2017  Account Number:  [id number]  Name: Angela Bishop  Length of session: 25 minutes    Patient Seen and Examined by [facility] licensed, Tele-psychiatrist, Marland Kitchen, MD from state of [place], via [facility] video conference system with RN     Patient's Clinical History of Present Illness and Chart Reviewed  SUBJECTIVE:  "I want to go home now. Well, I have an appointment to go to my appointment on Tuesday. I was not going to my appointments on Tuesday. "      Pt seen yesterday. Pt with no deterioration since yesterday. Pt is more awake. LESS tired. 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      Pt offers no complaints. Pt with no reported physical discomfort/pain or distress.     Per RN - pt continue to have no reported behavioral or mgt concerns at this time. Pt is compliant with rx'ed oral meds; and all other rx'ed treatment/nursing interventions.     Pt admits to have been non-compliant with rx'ed oral meds and also, not following up with established OP follow up with IOP once/week leading to continued illicit drug use       NOTE clinical data points reviewed on 08-18-17 for differential diagnoses and development of treatment plan/recommendations.    Review of Medical/Psychiatric Systems  A comprehensive review of systems was negative except for that written in the HPI.    OBJECTIVE: Pt with no cardio-pulmonary distress; and with no urgent neurological deficits   Mental Status exam:   Sensorium  oriented to time, place and person   Orientation person, place, time/date, situation, day of week, month of year and year   Relations passive   Eye Contact poor   Appearance:  disheveled   Motor Behavior:  hypoactive and gait stable   Speech:  normal pitch, non-pressured and soft   Vocabulary below average   Thought Process: goal directed   Thought Content free of delusions, free of hallucinations  and not internally preoccupied    Suicidal ideations none   Homicidal ideations none   Mood:    "I need to go"   Affect:  blunted   Memory recent   adequate   Memory remote:  adequate   Concentration:   adequate   Abstraction:   adequate   Insight:  limited   Reliability poor   Judgment:  limited     Patient Vitals for the past 8 hrs:   BP Temp Pulse Resp SpO2   08/19/17 0640 90/57 98.9 ??F (37.2 ??C) 98 20 100 %     Lab/Data Review:  All lab results for the last 24 hours reviewed.    Medications:  Current Facility-Administered Medications   Medication Dose Route Frequency   ??? ibuprofen (MOTRIN) tablet 400 mg  400 mg Oral Q6H PRN   ??? pregabalin (LYRICA) capsule 200 mg  200 mg Oral TID   ??? traZODone (DESYREL) tablet 100 mg  100 mg Oral QHS PRN   ??? haloperidol (HALDOL) tablet 5 mg  5 mg Oral BID   ??? fluticasone salmeterol (ADVAIR HFA) 33mcg-21mcg/puff  2 Puff Inhalation BID   ??? divalproex DR (DEPAKOTE) tablet 500 mg  500 mg Oral BID   ??? buPROPion (WELLBUTRIN) tablet 37.5 mg  37.5 mg Oral BID   ??? aluminum & magnesium hydroxide-simethicone (MYLANTA II) oral suspension 30 mL  30 mL  Oral Q4H PRN   ??? hydrOXYzine pamoate (VISTARIL) capsule 50 mg  50 mg Oral QID PRN   ??? magnesium hydroxide (MILK OF MAGNESIA) concentrated oral suspension 5 mL  5 mL Oral Q6H PRN   ??? multivitamin (ONE A DAY) tablet 1 Tab  1 Tab Oral DAILY   ??? nicotine (NICODERM CQ) 21 mg/24 hr patch 1 Patch  1 Patch TransDERmal DAILY PRN   ??? diphenoxylate-atropine (LOMOTIL) tablet 1 Tab  1 Tab Oral QID PRN     Side Effects:  none    Assessment/Plan:   DIAGNOSIS: Suicidal ideations [R45.851]  Suicidal ideation [R45.851]  I.   NO change in dx'tic impressions at this time           > no evidence of opiate/suboxone related w/drawals at this time.            > COWS score = LOW  II.  NO change in current medication regimen, and treatment plan at this time.   III. Pt in still a high risk for quick relapse at this time leading to imminent risks towards dangerousness to  self/others  IV.  Discussed with pt re: following (pt still expresses no interest with topics below):               - pt DENIES misuse/abuse or diversion of reported rx'ed suboxone from reported established suboxone clinic.               - insight/motivation towards sobriety              - learned skills/tools to be implemented, AND ability to implement identified skills tools when pt has urged to use/relapse              - identify and develop plans towards TRIGGERS to relapse AND, BARRIERS to sobriety/adherence to mh/SA follow-up              - participation with community based AA/NA groups              - ability to engage AA/NA sponsor              - if not yet done, promote family/support systems towards in ALANON/NA-ANON              - explore risks/benefits of MAT with opiate / ETOH use disorder.  Zack SealV.  Pursue collaborating with suboxone OP providers to determine compliance/risks & benefits of continued Suboxone MAT.           > CONSIDER to check urine Buprenorphine. IF NEGATIVE, high suspicion of diversion w/rx'ed suboxone.     Signed By: Georges LynchMitchell R Kho, MD

## 2017-08-19 NOTE — Behavioral Health Treatment Team (Signed)
 [facility]  INPATIENT BEHAVIORAL HEALTH  NURSING PROGRESS NOTE  Angela Bishop  08/19/17    BEHAVIORAL ASSESSMENT  Alert & oriented x4. Well groomed appearance. Currently denies both anxiety, depression and suicidal ideations. Tends to push boundaries, wanting to leave cafeteria early, wanting medications early. Seems to be somatic,  c/o headache, back hurting then cp on different occasions throughout the day, yet  states that she is ready to go home!  Became agitated because she had a new room mate. Threw a tantrum in room , verbally abusive to new patient,(letting her know that she was not wanted as a roommate) throwing her pillow in room and thrusting around in bed loudly then got up and stormed out of room.      CIWA Scale:no  COW Scale:no  PAIN MEDICATION GIVEN THIS SHIFT; none   PAIN ASSESSMENT AND REASSESSMENT COMPLETED: yes      Appetite Assessment  Percentage of Patient's Food Consumption: 100%  Clinician's Observations : No N/V or Gi upset    COLUMBIA-SUICIDE SEVERITY RATING SCALE  Daily/Shift Screen    Ask questions that are bold and underlined Since Last Asked   Ask Question 2* YES NO   2) Suicidal Thoughts:     Since you were last asked, have you actually had thoughts about killing yourself?   x   If YES to 2, ask questions 3, 4, 5, and 6.  If NO to 2, go directly to question 6   3) Suicidal Thoughts with Method (without Specific Plan or Intent to Act):     Have you been thinking about how you might do this?      4) Suicidal Intent (without Specific Plan):     Have you had these thoughts and had some intention of acting on them?      5) Suicide Intent with Specific Plan:     Have you started to work out or worked out the details of how to kill yourself? Do you intend to carry out this plan?      6) Suicide Behavior    Have you done anything, started to do anything, or prepared to do anything to end your life?    Examples: Collected pills, obtained a gun, gave away valuables,  wrote a will or suicide note, took out pills but didn't swallow any, held a gun but changed your mind or it was grabbed from your hand, went to the roof but didn't jump; or actually took pills, tried to shoot yourself, cut yourself, tried to hang yourself, etc.  If YES, what did you do?____________________________________________  _______________________________________________________________  x     * Note - for frequent assessment purposes, Question 1 has been omitted           If the patient came in suicidal and now denies ask the patient what has changed and list response: Pt denies suicidal thoughts.         INTERVENTIONS      Treatment Plan Problem List & Correlating Goals/Interventions    Problem # Problem   1 Risk of harm to self   2 depression   3 anxiety   4 paranoia           CLIENT RESPONSE TO TREATMENT    Denies suicidal ideations  Denies depression  Denies anxiety  Denies paranoia

## 2017-08-19 NOTE — Progress Notes (Signed)
Patient complaining with headache. Nursing confirmed with patient that she can take ibuprofen. Will order for ibuprofen prn

## 2017-08-19 NOTE — Behavioral Health Treatment Team (Signed)
Psychiatry Progress Note    Psychiatric Progress Note of Tele-Psych MD - Marland Kitchen    Date: 08/19/2017  Account Number:  [id number]  Name: Angela Bishop  Length of session: 25 minutes    Patient Seen and Examined by [facility] licensed, Tele-psychiatrist, Marland Kitchen, MD from state of [place], via [facility] video conference system with RN     Patient's Clinical History of Present Illness and Chart Reviewed  SUBJECTIVE:  "I want to go home now. Well, I have an appointment to go to my appointment on Tuesday. I was not going to my appointments on Tuesday. "      Pt seen yesterday. Pt with no deterioration since yesterday. Pt is more awake. LESS tired. 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      Pt offers no complaints. Pt with no reported physical discomfort/pain or distress.     Per RN - pt continue to have no reported behavioral or mgt concerns at this time. Pt is compliant with rx'ed oral meds; and all other rx'ed treatment/nursing interventions.     Pt admits to have been non-compliant with rx'ed oral meds and also, not following up with established OP follow up with IOP once/week leading to continued illicit drug use       NOTE clinical data points reviewed on 08-18-17 for differential diagnoses and development of treatment plan/recommendations.    Review of Medical/Psychiatric Systems  A comprehensive review of systems was negative except for that written in the HPI.    OBJECTIVE: Pt with no cardio-pulmonary distress; and with no urgent neurological deficits   Mental Status exam:   Sensorium  oriented to time, place and person   Orientation person, place, time/date, situation, day of week, month of year and year   Relations passive   Eye Contact poor   Appearance:  disheveled   Motor Behavior:  hypoactive and gait stable   Speech:  normal pitch, non-pressured and soft   Vocabulary below average   Thought Process: goal directed    Thought Content free of delusions, free of hallucinations and not internally preoccupied    Suicidal ideations none   Homicidal ideations none   Mood:    "I need to go"   Affect:  blunted   Memory recent   adequate   Memory remote:  adequate   Concentration:   adequate   Abstraction:   adequate   Insight:  limited   Reliability poor   Judgment:  limited     Patient Vitals for the past 8 hrs:   BP Temp Pulse Resp SpO2   08/19/17 0640 90/57 98.9 ??F (37.2 ??C) 98 20 100 %     Lab/Data Review:  All lab results for the last 24 hours reviewed.    Medications:  Current Facility-Administered Medications   Medication Dose Route Frequency   ??? ibuprofen (MOTRIN) tablet 400 mg  400 mg Oral Q6H PRN   ??? pregabalin (LYRICA) capsule 200 mg  200 mg Oral TID   ??? traZODone (DESYREL) tablet 100 mg  100 mg Oral QHS PRN   ??? haloperidol (HALDOL) tablet 5 mg  5 mg Oral BID   ??? fluticasone salmeterol (ADVAIR HFA) 69mcg-21mcg/puff  2 Puff Inhalation BID   ??? divalproex DR (DEPAKOTE) tablet 500 mg  500 mg Oral BID   ??? buPROPion (WELLBUTRIN) tablet 37.5 mg  37.5 mg Oral BID   ??? aluminum & magnesium hydroxide-simethicone (MYLANTA II) oral suspension 30 mL  30 mL  Oral Q4H PRN   ??? hydrOXYzine pamoate (VISTARIL) capsule 50 mg  50 mg Oral QID PRN   ??? magnesium hydroxide (MILK OF MAGNESIA) concentrated oral suspension 5 mL  5 mL Oral Q6H PRN   ??? multivitamin (ONE A DAY) tablet 1 Tab  1 Tab Oral DAILY   ??? nicotine (NICODERM CQ) 21 mg/24 hr patch 1 Patch  1 Patch TransDERmal DAILY PRN   ??? diphenoxylate-atropine (LOMOTIL) tablet 1 Tab  1 Tab Oral QID PRN     Side Effects:  none    Assessment/Plan:   DIAGNOSIS: Suicidal ideations [R45.851]  Suicidal ideation [R45.851]  I.   NO change in dx'tic impressions at this time           > no evidence of opiate/suboxone related w/drawals at this time.            > COWS score = LOW  II.  NO change in current medication regimen, and treatment plan at this time.    III. Pt in still a high risk for quick relapse at this time leading to imminent risks towards dangerousness to self/others  IV.  Discussed with pt re: following (pt still expresses no interest with topics below):               - pt DENIES misuse/abuse or diversion of reported rx'ed suboxone from reported established suboxone clinic.               - insight/motivation towards sobriety              - learned skills/tools to be implemented, AND ability to implement identified skills tools when pt has urged to use/relapse              - identify and develop plans towards TRIGGERS to relapse AND, BARRIERS to sobriety/adherence to mh/SA follow-up              - participation with community based AA/NA groups              - ability to engage AA/NA sponsor              - if not yet done, promote family/support systems towards in ALANON/NA-ANON              - explore risks/benefits of MAT with opiate / ETOH use disorder.  Zack SealV.  Pursue collaborating with suboxone OP providers to determine compliance/risks & benefits of continued Suboxone MAT.           > CONSIDER to check urine Buprenorphine. IF NEGATIVE, high suspicion of diversion w/rx'ed suboxone.     Signed By: Georges LynchMitchell R Jakarri Lesko, MD

## 2017-08-19 NOTE — Other (Signed)
Patient did not attend 1PM therapy group despite prompting. Will continue to encourage attendance and participation.

## 2017-08-19 NOTE — Behavioral Health Treatment Team (Signed)
[facility]HAV[facility]  NURSING PROGRESS NOTE  Angela Bishop  08/19/17    BEHAVIORAL ASSESSMENT  Alert & oriented x4. Well groomed appearance. Currently denies both anxiety, depression and suicidal ideations. Tends to push boundaries, wanting to leave cafeteria early, wanting medications early. Seems to be somatic,  c/o headache, back hurting then cp on different occasions throughout the day, yet  states that she is ready to go home!  Became agitated because she had a new room mate. Threw a tantrum in room , verbally abusive to new patient,(letting her know that she was not wanted as a roommate) throwing her pillow in room and thrusting around in bed loudly then got up and stormed out of room.      CIWA Scale:no  COW Scale:no  PAIN MEDICATION GIVEN THIS SHIFT; none   PAIN ASSESSMENT AND REASSESSMENT COMPLETED: yes      Appetite Assessment  Percentage of Patient's Food Consumption: 100%  Clinician's Observations : No N/V or Gi upset    COLUMBIA-SUICIDE SEVERITY RATING SCALE  Daily/Shift Screen    Ask questions that are bold and underlined Since Last Asked   Ask Question 2* YES NO   2) Suicidal Thoughts:     Since you were last asked, have you actually had thoughts about killing yourself?   x   If YES to 2, ask questions 3, 4, 5, and 6.  If NO to 2, go directly to question 6   3) Suicidal Thoughts with Method (without Specific Plan or Intent to Act):     Have you been thinking about how you might do this?      4) Suicidal Intent (without Specific Plan):     Have you had these thoughts and had some intention of acting on them?      5) Suicide Intent with Specific Plan:     Have you started to work out or worked out the details of how to kill yourself? Do you intend to carry out this plan?      6) Suicide Behavior    Have you done anything, started to do anything, or prepared to do anything to end your life?     Examples: Collected pills, obtained a gun, gave away valuables, wrote a will or suicide note, took out pills but didn???t swallow any, held a gun but changed your mind or it was grabbed from your hand, went to the roof but didn???t jump; or actually took pills, tried to shoot yourself, cut yourself, tried to hang yourself, etc.  If YES, what did you do?____________________________________________  _______________________________________________________________  x     * Note ??? for frequent assessment purposes, Question 1 has been omitted           If the patient came in suicidal and now denies ask the patient what has changed and list response: Pt denies suicidal thoughts.         INTERVENTIONS      Treatment Plan Problem List & Correlating Goals/Interventions    Problem # Problem   1 Risk of harm to self   2 depression   3 anxiety   4 paranoia           CLIENT RESPONSE TO TREATMENT    Denies suicidal ideations  Denies depression  Denies anxiety  Denies paranoia

## 2017-08-19 NOTE — Progress Notes (Signed)
Patient complaining with headache. Nursing confirmed with patient that she can take ibuprofen. Will order for ibuprofen prn

## 2017-08-20 LAB — COMPREHENSIVE METABOLIC PANEL
ALT: 19 U/L (ref 12–78)
AST: 13 U/L — ABNORMAL LOW (ref 15–37)
Albumin/Globulin Ratio: 0 — ABNORMAL LOW (ref 1.2–2.2)
Albumin: 4 g/dL (ref 3.4–5.0)
Alkaline Phosphatase: 104 U/L (ref 45–117)
Anion Gap: 10 mmol/L (ref 6–15)
BUN: 11 MG/DL (ref 7–18)
Bun/Cre Ratio: 13 (ref 7–25)
CO2: 20 mmol/L — ABNORMAL LOW (ref 21–32)
Calcium: 9.2 MG/DL (ref 8.5–10.1)
Chloride: 112 mmol/L — ABNORMAL HIGH (ref 98–107)
Creatinine: 0.86 MG/DL (ref 0.60–1.30)
EGFR IF NonAfrican American: >60 mL/min/{1.73_m2} (ref >60)
GFR African American: >60 mL/min/{1.73_m2} (ref >60)
Globulin: 3.1 g/dL (ref 2.4–3.5)
Glucose: 93 mg/dL (ref 70–110)
Potassium: 4 mmol/L (ref 3.5–5.3)
Sodium: 142 mmol/L (ref 136–145)
Total Bilirubin: 0.4 MG/DL (ref <1.1)
Total Protein: 7.1 g/dL (ref 6.4–8.2)

## 2017-08-20 LAB — VALPROIC ACID
Valproic Acid: 66 ug/ml (ref 50–100)
Valproic acid: 66 ug/ml (ref 50–100)

## 2017-08-20 LAB — METABOLIC PANEL, COMPREHENSIVE
A-G Ratio: 0 — ABNORMAL LOW (ref 1.2–2.2)
ALT (SGPT): 19 U/L (ref 12–78)
AST (SGOT): 13 U/L — ABNORMAL LOW (ref 15–37)
Albumin: 4 g/dL (ref 3.4–5.0)
Alk. phosphatase: 104 U/L (ref 45–117)
Anion gap: 10 mmol/L (ref 6–15)
BUN/Creatinine ratio: 13 (ref 7–25)
BUN: 11 MG/DL (ref 7–18)
Bilirubin, total: 0.4 MG/DL (ref <1.1)
CO2: 20 mmol/L — ABNORMAL LOW (ref 21–32)
Calcium: 9.2 MG/DL (ref 8.5–10.1)
Chloride: 112 mmol/L — ABNORMAL HIGH (ref 98–107)
Creatinine: 0.86 MG/DL (ref 0.60–1.30)
GFR est AA: >60 mL/min/{1.73_m2} (ref >60)
GFR est non-AA: >60 mL/min/{1.73_m2} (ref >60)
Globulin: 3.1 g/dL (ref 2.4–3.5)
Glucose: 93 mg/dL (ref 70–110)
Potassium: 4 mmol/L (ref 3.5–5.3)
Protein, total: 7.1 g/dL (ref 6.4–8.2)
Sodium: 142 mmol/L (ref 136–145)

## 2017-08-20 MED ORDER — FLUTICASONE-SALMETEROL 45 MCG-21 MCG/ACTUATION AEROSOL INHALER
45-21 mcg/actuation | Freq: Two times a day (BID) | RESPIRATORY_TRACT | 0 refills | Status: AC
Start: 2017-08-20 — End: ?

## 2017-08-20 MED ORDER — DIVALPROEX 500 MG TAB, DELAYED RELEASE
500 mg | ORAL_TABLET | Freq: Two times a day (BID) | ORAL | 1 refills | Status: DC
Start: 2017-08-20 — End: 2017-10-23

## 2017-08-20 MED ORDER — MULTIVITAMIN TAB
ORAL_TABLET | Freq: Every day | ORAL | 1 refills | Status: AC
Start: 2017-08-20 — End: ?

## 2017-08-20 MED ORDER — TRAZODONE 100 MG TAB
100 mg | ORAL_TABLET | Freq: Every evening | ORAL | 1 refills | Status: DC | PRN
Start: 2017-08-20 — End: 2017-10-23

## 2017-08-20 MED ORDER — HALOPERIDOL 5 MG TAB
5 mg | ORAL_TABLET | Freq: Two times a day (BID) | ORAL | 1 refills | Status: DC
Start: 2017-08-20 — End: 2017-08-20

## 2017-08-20 MED ORDER — BUPROPION 75 MG TAB
75 mg | ORAL_TABLET | Freq: Two times a day (BID) | ORAL | 1 refills | Status: DC
Start: 2017-08-20 — End: 2017-10-23

## 2017-08-20 MED ORDER — HALOPERIDOL 5 MG TAB
5 mg | ORAL_TABLET | Freq: Two times a day (BID) | ORAL | 1 refills | Status: AC
Start: 2017-08-20 — End: ?

## 2017-08-20 MED FILL — LYRICA 50 MG CAPSULE: 50 mg | ORAL | Qty: 4

## 2017-08-20 MED FILL — HYDROXYZINE PAMOATE 50 MG CAP: 50 mg | ORAL | Qty: 1

## 2017-08-20 MED FILL — TRAZODONE 150 MG TAB: 150 mg | ORAL | Qty: 1

## 2017-08-20 MED FILL — BUPROPION 75 MG TAB: 75 mg | ORAL | Qty: 1

## 2017-08-20 MED FILL — HALOPERIDOL 5 MG TAB: 5 mg | ORAL | Qty: 1

## 2017-08-20 MED FILL — TAB-A-VITE TABLET: ORAL | Qty: 1

## 2017-08-20 MED FILL — DIVALPROEX 500 MG TAB, DELAYED RELEASE: 500 mg | ORAL | Qty: 1

## 2017-08-20 MED FILL — IBUPROFEN 200 MG TAB: 200 mg | ORAL | Qty: 2

## 2017-08-20 NOTE — Discharge Summary (Signed)
DC Summary already dictated [id number]

## 2017-08-20 NOTE — Behavioral Health Treatment Team (Signed)
SUICIDE RE-ASSESSMENT  In the past 12 hours have you had thoughts of hurting yourself:  no  On scale of 1-10 how does patient rate suicidal thoughts: 1  ?  On the nurses scale of 1-5 where does your patient rate 1= no suicidal ideations, 2= vague thoughts, 3= strong thoughts with no plan; 4= thoughts with plan low lethality high chance of rescue; 5= strong thoughts with plan and means to carry out plan high lethality low chance of rescue)           Orders received to discharge patient to home per Dr. Daria Pastures.  Discharge instructions, healthy lifestyle, medications and follow up appointments reviewed with patient.  Patient verbalized understanding.  Pt will discharge to home with [facility] up at [facility],  appointment dates and times reviewed, patient verbalized understanding. AVS and BH Transition Record  Was completed, hard copy given to patient. Prescriptions for mv, depakote, trazodone, wellbutrin, advair, haldol Given to patient. All personal belongings obtained and verified with patient, discharge instructions and AVS signed.  No items in safe. AVS and BH Transition Record information reviewed with patient.  Patient  provided with Behavioral Health phone number [phone] and instructed to talk  with Charge Nurse for any needs 24 hours a day /7days a week.Patient discharged via front hallway in stable condition with all belongings. Patient left building with taxi. AVS and BH Transition faxed to the next level of care.  Suicide Hotline Number [phone]  24 hour/7 day a week contact phone number [phone]

## 2017-08-20 NOTE — Behavioral Health Treatment Team (Signed)
BH Notes by Nils Pyle  at 08/20/17 1604                Author: Nils Pyle  Service: --  Author Type: Registered Nurse       Filed: 08/20/17 1611  Date of Service: 08/20/17 1604  Status: Signed          Editor: Nils Pyle                       Behavioral Health Transition Record to Provider      Patient Name: Angela Bishop   Date of Birth: Dec 15, 1985   Medical Record Number: [id number]   Date of Admission: 08/16/2017   Date of Discharge: 08/20/2017      Attending Provider: Conard Novak, MD   Discharging Provider: Conard Novak, MD   To contact this individual call [phone] and ask the operator to page.  If unavailable, ask to be transferred to [facility] Provider on call. A Behavioral Health Provider will be  available on call 24/7 and during holidays       Primary Care Provider: Sheliah Mends, NP        Allergies        Allergen  Reactions         ?  Fentanyl  Swelling     ?  Reglan [Metoclopramide]  Other (comments)     ?  Toradol [Ketorolac]  Swelling     ?  Ultram [Tramadol]  Nausea and Vomiting         ?  Zofran [Ondansetron Hcl]  Other (comments)           Admission Diagnosis: Suicidal ideations [R45.851]   Suicidal ideation [R45.851]   Met with pt in ED bed 19 for intake assessment. Pt came to ED for SI with plan to cut wrists.    * No surgery found *        Results for orders placed or performed during the hospital encounter of 08/16/17     ACETAMINOPHEN         Result  Value  Ref Range            Acetaminophen level  <10 (L)  10 - 30 ug/mL       CBC WITH AUTOMATED DIFF         Result  Value  Ref Range            WBC  10.2  4.5 - 10.8 K/uL       RBC  4.36  4.2 - 5.4 M/uL       HGB  14.2  12.0 - 16.0 g/dL       HCT  41.6  41 - 53 %       MCV  94.7  80 - 100 FL       MCH  32.6 (H)  27 - 31 PG       MCHC  34.4  31 - 37 g/dL       RDW  60.6  30.1 - 14.5 %       PLATELET  285  130 - 400 K/uL       MPV  10.1  5.9 - 10.3 FL       NEUTROPHILS  69  40 - 74 %        LYMPHOCYTES  19  19 - 48 %       MONOCYTES  10 (H)  3 -  9 %       EOSINOPHILS  1  0 - 5 %       BASOPHILS  1  0 - 2 %       ABS. NEUTROPHILS  7.1  1.5 - 8.0 K/UL       ABS. LYMPHOCYTES  2.0  0.8 - 3.5 K/UL       ABS. MONOCYTES  1.0  0.8 - 3.5 K/UL       ABS. EOSINOPHILS  0.1  0.0 - 0.5 K/UL       ABS. BASOPHILS  0.1  0.0 - 0.1 K/UL       DF  AUTOMATED          IMMATURE GRANULOCYTES  0 (L)  2 - 10 %       ABS. IMM. GRANS.  0.0  K/UL       DRUG SCREEN, URINE         Result  Value  Ref Range            METHADONE  NEGATIVE           PCP(PHENCYCLIDINE)  NEGATIVE           BENZODIAZEPINES  NEGATIVE           COCAINE  NEGATIVE           AMPHETAMINES  POSITIVE          OPIATES  NEGATIVE           BARBITURATES  NEGATIVE           TRICYCLICS  POSITIVE          THC (TH-CANNABINOL)  NEGATIVE           ETHYL ALCOHOL         Result  Value  Ref Range            ALCOHOL(ETHYL),SERUM  None detected  MG/DL       METABOLIC PANEL, COMPREHENSIVE         Result  Value  Ref Range            Sodium  141  136 - 145 mmol/L       Potassium  3.6  3.5 - 5.3 mmol/L       Chloride  111 (H)  98 - 107 mmol/L       CO2  19 (L)  21 - 32 mmol/L       Anion gap  11  6 - 15 mmol/L       Glucose  90  70 - 110 mg/dL       BUN  13  7 - 18 MG/DL       Creatinine  5.73  0.60 - 1.30 MG/DL       BUN/Creatinine ratio  13  7 - 25         GFR est AA  >60  >60 ml/min/1.33m2       GFR est non-AA  >60  >60 ml/min/1.5m2       Calcium  9.1  8.5 - 10.1 MG/DL       Bilirubin, total  0.7  <1.1 MG/DL       ALT (SGPT)  36  12 - 78 U/L       AST (SGOT)  15  15 - 37 U/L       Alk. phosphatase  118 (H)  45 - 117 U/L       Protein, total  7.6  6.4 - 8.2 g/dL       Albumin  3.9  3.4 - 5.0 g/dL       Globulin  3.7 (H)  2.4 - 3.5 g/dL       A-G Ratio  1.1 (L)  1.2 - 2.2         SALICYLATE         Result  Value  Ref Range            Salicylate level  <2.8 (L)  2.8 - 20.0 MG/DL       URINALYSIS W/ RFLX MICROSCOPIC         Result  Value  Ref Range            Color  AMBER           Appearance  CLOUDY          Specific gravity  >=1.030  1.002 - 1.030         pH (UA)  6.0  4.5 - 8.0         Protein  TRACE (A)  mg/dL       Glucose  NEGATIVE   mg/dL       Ketone  40 (A)  NEG mg/dL       Bilirubin  NEGATIVE           Blood  NEGATIVE           Urobilinogen  1.0  0 - 1 EU/dL       Nitrites  NEGATIVE           Leukocyte Esterase  SMALL (A)          WBC  10-20  0 - 2 /hpf       RBC  0-2  0 - 2 /hpf       Bacteria  2+  /hpf       Hyaline cast  10-20  /lpf       Epithelial cells  10-20  /lpf       THYROID PANEL W/TSH         Result  Value  Ref Range            TSH  0.94  0.35 - 3.74 uIU/mL       T4, Total  9.5  4.7 - 13.3 ug/dL       T3 Uptake  31  31 - 39 %       Free thyroxine index  2.9  1.4 - 5.2         HCG QL SERUM         Result  Value  Ref Range            HCG, Ql.  NEGATIVE           ANTIDEPRESSANT PANEL, UR         Result  Value  Ref Range            Tricyclic Antidepressants  NEGATIVE   Cutoff=100 ng/mL       Please note  Comment          FOLATE         Result  Value  Ref Range            Folate  >20.0 (H)  3.1 - 17.5 ng/mL       LIPID PANEL         Result  Value  Ref Range  Triglyceride  164 (H)  <150 MG/DL       Cholesterol, total  183  <200 MG/DL       HDL Cholesterol  34  32 - 96 MG/DL       LDL, calculated  122.76  <130 MG/DL       VLDL, calculated  32.8 (H)  5 - 32 MG/DL       VITAMIN N56         Result  Value  Ref Range            Vitamin B12  285  254 - 1,320 pg/mL       METABOLIC PANEL, COMPREHENSIVE         Result  Value  Ref Range            Sodium  142  136 - 145 mmol/L       Potassium  4.0  3.5 - 5.3 mmol/L       Chloride  112 (H)  98 - 107 mmol/L       CO2  20 (L)  21 - 32 mmol/L       Anion gap  10  6 - 15 mmol/L       Glucose  93  70 - 110 mg/dL       BUN  11  7 - 18 MG/DL       Creatinine  2.13  0.60 - 1.30 MG/DL       BUN/Creatinine ratio  13  7 - 25         GFR est AA  >60  >60 ml/min/1.45m2       GFR est non-AA  >60  >60 ml/min/1.58m2       Calcium  9.2  8.5 -  10.1 MG/DL       Bilirubin, total  0.4  <1.1 MG/DL       ALT (SGPT)  19  12 - 78 U/L       AST (SGOT)  13 (L)  15 - 37 U/L       Alk. phosphatase  104  45 - 117 U/L       Protein, total  7.1  6.4 - 8.2 g/dL       Albumin  4.0  3.4 - 5.0 g/dL       Globulin  3.1  2.4 - 3.5 g/dL            A-G Ratio  0.0 (L)  1.2 - 2.2             Immunizations administered during this encounter:    There is no immunization history on file for this patient.      Screening for Metabolic Disorders for Patients on Antipsychotic Medications   (Data obtained from the EMR)      Estimated Body Mass Index   Estimated body mass index is 22.27 kg/m as calculated from the following:     Height as of this encounter: 5\' 5"  (1.651 m).     Weight as of this encounter: 60.7 kg (133 lb 12.8 oz).       Vital Signs/Blood Pressure   Visit Vitals      BP  107/67 (BP 1 Location: Left arm, BP Patient Position: Sitting)     Pulse  89     Temp  99 F (37.2 C)     Resp  18     Ht  5\' 5"  (1.651 m)  Wt  60.7 kg (133 lb 12.8 oz)     SpO2  100%        BMI  22.27 kg/m           Hemoglobin A1c   No results found for: HBA1C, HGBE8, HBA1CEXT       Lipid Panel     Lab Results         Component  Value  Date/Time            Cholesterol, total  183  08/17/2017 07:39 AM       HDL Cholesterol  34  08/17/2017 07:39 AM       LDL, calculated  122.76  08/17/2017 07:39 AM            Triglyceride  164 (H)  08/17/2017 07:39 AM           Discharge Diagnosis: Bipolar disorder with psychotic features.  Suicidal ideation      Discharge Plan: Follow-up with [facility]      Discharge Medication List and Instructions:      Current Discharge Medication List              START taking these medications          Details        multivitamin (ONE A DAY) tablet  Take 1 Tab by mouth daily. Indications: Treatment To Prevent Vitamin Deficiency   Qty: 30 Tab, Refills:  1               divalproex DR (DEPAKOTE) 500 mg tablet  Take 1 Tab by mouth two (2) times a day. Indications:  Bipolar I Disorder with Most Recent Episode Mixed   Qty: 30 Tab, Refills:  1               buPROPion (WELLBUTRIN) 75 mg tablet  Take 0.5 Tabs by mouth two (2) times a day. Indications: Bipolar Depression   Qty: 15 Tab, Refills:  1                     CONTINUE these medications which have CHANGED          Details        traZODone (DESYREL) 100 mg tablet  Take 1 Tab by mouth nightly as needed for Sleep. Indications: major depressive disorder   Qty: 15 Tab, Refills:  1               fluticasone-Salmeterol (ADVAIR HFA) 45-21 mcg/actuation inhaler  Take 2 Puffs by inhalation two (2) times a day. Indications: Controller Medication for Asthma   Qty: 1 Inhaler, Refills:  0               haloperidol (HALDOL) 5 mg tablet  Take 1 Tab by mouth two (2) times a day. Indications: Psychosis   Qty: 30 Tab, Refills:  1                     CONTINUE these medications which have NOT CHANGED          Details        diphenoxylate-atropine (LOMOTIL) 2.5-0.025 mg per tablet  Take 2 Tabs by mouth.               pregabalin (LYRICA) 200 mg capsule  Take 200 mg by mouth three (3) times daily.               medroxyPROGESTERone (DEPO-PROVERA) 150 mg/mL injection  150 mg by IntraMUSCular route once.                     STOP taking these medications                  SUBOXONE 8-2 mg film sublingaul film  Comments:    Reason for Stopping:                               Unresulted Labs  (24h ago, onward)          None               To obtain results of studies pending at discharge, please contact [phone]        Follow-up Information               Follow up With  Specialties  Details  Why  Contact Info              [facility]     Go to  walk-in days for intake are Mondays, Tuesdays, and Thursdays, 8-3:45.  [address]    [place] [address]   P [phone]      F [phone]                 Sheliah Mends, NP  Nurse Practitioner      [address]   [place] [address]   [phone]                   Advanced Directive:     Does the patient have an appointed surrogate decision maker?  No   Does the patient have a Medical Advance Directive? No   Does the patient have a Psychiatric Advance Directive? No   If the patient does not have a surrogate or Medical Advance Directive AND Psychiatric Advance Directive, the patient was offered information on these advance directives  Patient declined to complete         Patient Instructions: Please continue all medications until otherwise directed  by physician.       Diet- Regular   Activity as tolerated   Blood glucose was fasting upon this admission. Yes   Psychiatric Advanced Care Directives- No   Patient was offered information, patient declined.   POA:  None      Keep all follow up appointments as scheduled, continue to take prescribed medications per physician instructions.      Suicide Hotline Number [phone]   24 hour/7 day a week contact phone number [phone]      Tobacco Cessation Discharge Plan:    Is the patient a smoker and needs referral for smoking cessation? No   Patient referred to the following for smoking cessation with an appointment? Not applicable      Patient was offered medication to assist with smoking cessation at discharge? Not applicable   Was education for smoking cessation added to the discharge instructions? Not applicable      Alcohol/Substance Abuse Discharge Plan:    Does the patient have a history of substance/alcohol abuse and requires a referral for treatment? No   Patient referred to the following for substance/alcohol abuse treatment with an appointment? Not applicable   Patient was offered medication to assist with alcohol cessation at discharge? Not applicable   Was education for substance/alcohol abuse added to discharge instructions? Not applicable  Patient discharged to Home; discussed with patient/caregiver and provided to the patient/caregiver either in hard copy or electronically.

## 2017-08-20 NOTE — Progress Notes (Signed)
Shift assessment complete.  AAO. Walking in the hallway. Seems anxious but rates anxiety 0/10, depression 0/10, denies suicidal or homicidal thoughts.

## 2017-08-20 NOTE — Behavioral Health Treatment Team (Signed)
 BH Notes by Mavis Cheryl BRAVO at 08/20/17 1800                Author: Mavis Cheryl BRAVO  Service: Behavioral Health  Author Type: Registered Nurse       Filed: 08/21/17 1107  Date of Service: 08/20/17 1800  Status: Signed          Editor: Mavis Cheryl BRAVO [facility] Transition Record to Provider      Patient Name: Angela Bishop   Date of Birth: [birth year]   Medical Record Number: [id number]   Date of Admission: 08/16/2017   Date of Discharge: 08/20/2017      Attending Provider: No att. providers found   Discharging Provider: Oloworaran, Ayobola A, MD   To contact this individual call [phone] and ask the operator to page.  If unavailable, ask to be transferred to [facility] Provider on call. A Behavioral Health Provider will be  available on call 24/7 and during holidays       Primary Care Provider: Latisha Cones, NP        Allergies        Allergen  Reactions         ?  Fentanyl  Swelling     ?  Reglan [Metoclopramide]  Other (comments)     ?  Toradol [Ketorolac]  Swelling     ?  Ultram [Tramadol]  Nausea and Vomiting         ?  Zofran [Ondansetron Hcl]  Other (comments)           Admission Diagnosis: Suicidal ideations [R45.851]   Suicidal ideation [R45.851]   Met with pt in ED bed 19 for intake assessment. Pt came to ED for SI with plan to cut wrists.    * No surgery found *        Results for orders placed or performed during the hospital encounter of 08/16/17     ACETAMINOPHEN         Result  Value  Ref Range            Acetaminophen level  <10 (L)  10 - 30 ug/mL       CBC WITH AUTOMATED DIFF         Result  Value  Ref Range            WBC  10.2  4.5 - 10.8 K/uL       RBC  4.36  4.2 - 5.4 M/uL       HGB  14.2  12.0 - 16.0 g/dL       HCT  58.6  41 - 53 %       MCV  94.7  80 - 100 FL       MCH  32.6 (H)  27 - 31 PG       MCHC  34.4  31 - 37 g/dL       RDW  87.4  88.4 - 14.5 %       PLATELET  285  130 - 400 K/uL       MPV  10.1  5.9 - 10.3 FL       NEUTROPHILS  69  40  - 74 %       LYMPHOCYTES  19  19 - 48 %       MONOCYTES  10 (  H)  3 - 9 %       EOSINOPHILS  1  0 - 5 %       BASOPHILS  1  0 - 2 %       ABS. NEUTROPHILS  7.1  1.5 - 8.0 K/UL       ABS. LYMPHOCYTES  2.0  0.8 - 3.5 K/UL       ABS. MONOCYTES  1.0  0.8 - 3.5 K/UL       ABS. EOSINOPHILS  0.1  0.0 - 0.5 K/UL       ABS. BASOPHILS  0.1  0.0 - 0.1 K/UL       DF  AUTOMATED          IMMATURE GRANULOCYTES  0 (L)  2 - 10 %       ABS. IMM. GRANS.  0.0  K/UL       DRUG SCREEN, URINE         Result  Value  Ref Range            METHADONE  NEGATIVE           PCP(PHENCYCLIDINE)  NEGATIVE           BENZODIAZEPINES  NEGATIVE           COCAINE  NEGATIVE           AMPHETAMINES  POSITIVE          OPIATES  NEGATIVE           BARBITURATES  NEGATIVE           TRICYCLICS  POSITIVE          THC (TH-CANNABINOL)  NEGATIVE           ETHYL ALCOHOL         Result  Value  Ref Range            ALCOHOL(ETHYL),SERUM  None detected  MG/DL       METABOLIC PANEL, COMPREHENSIVE         Result  Value  Ref Range            Sodium  141  136 - 145 mmol/L       Potassium  3.6  3.5 - 5.3 mmol/L       Chloride  111 (H)  98 - 107 mmol/L       CO2  19 (L)  21 - 32 mmol/L       Anion gap  11  6 - 15 mmol/L       Glucose  90  70 - 110 mg/dL       BUN  13  7 - 18 MG/DL       Creatinine  9.01  0.60 - 1.30 MG/DL       BUN/Creatinine ratio  13  7 - 25         GFR est AA  >60  >60 ml/min/1.29m2       GFR est non-AA  >60  >60 ml/min/1.53m2       Calcium  9.1  8.5 - 10.1 MG/DL       Bilirubin, total  0.7  <1.1 MG/DL       ALT (SGPT)  36  12 - 78 U/L       AST (SGOT)  15  15 - 37 U/L       Alk. phosphatase  118 (H)  45 - 117 U/L       Protein,  total  7.6  6.4 - 8.2 g/dL       Albumin  3.9  3.4 - 5.0 g/dL       Globulin  3.7 (H)  2.4 - 3.5 g/dL       A-G Ratio  1.1 (L)  1.2 - 2.2         SALICYLATE         Result  Value  Ref Range            Salicylate level  <2.8 (L)  2.8 - 20.0 MG/DL       URINALYSIS W/ RFLX MICROSCOPIC         Result  Value  Ref Range            Color   AMBER          Appearance  CLOUDY          Specific gravity  >=1.030  1.002 - 1.030         pH (UA)  6.0  4.5 - 8.0         Protein  TRACE (A)  mg/dL       Glucose  NEGATIVE   mg/dL       Ketone  40 (A)  NEG mg/dL       Bilirubin  NEGATIVE           Blood  NEGATIVE           Urobilinogen  1.0  0 - 1 EU/dL       Nitrites  NEGATIVE           Leukocyte Esterase  SMALL (A)          WBC  10-20  0 - 2 /hpf       RBC  0-2  0 - 2 /hpf       Bacteria  2+  /hpf       Hyaline cast  10-20  /lpf       Epithelial cells  10-20  /lpf       THYROID PANEL W/TSH         Result  Value  Ref Range            TSH  0.94  0.35 - 3.74 uIU/mL       T4, Total  9.5  4.7 - 13.3 ug/dL       T3 Uptake  31  31 - 39 %       Free thyroxine index  2.9  1.4 - 5.2         HCG QL SERUM         Result  Value  Ref Range            HCG, Ql.  NEGATIVE           ANTIDEPRESSANT PANEL, UR         Result  Value  Ref Range            Tricyclic Antidepressants  NEGATIVE   Cutoff=100 ng/mL       Please note  Comment          FOLATE         Result  Value  Ref Range            Folate  >20.0 (H)  3.1 - 17.5 ng/mL       LIPID PANEL         Result  Value  Ref Range  Triglyceride  164 (H)  <150 MG/DL       Cholesterol, total  183  <200 MG/DL       HDL Cholesterol  34  32 - 96 MG/DL       LDL, calculated  122.76  <130 MG/DL       VLDL, calculated  32.8 (H)  5 - 32 MG/DL       VITAMIN A87         Result  Value  Ref Range            Vitamin B12  285  254 - 1,320 pg/mL       VALPROIC ACID         Result  Value  Ref Range            Valproic acid  66  50 - 100 ug/ml       METABOLIC PANEL, COMPREHENSIVE         Result  Value  Ref Range            Sodium  142  136 - 145 mmol/L       Potassium  4.0  3.5 - 5.3 mmol/L       Chloride  112 (H)  98 - 107 mmol/L       CO2  20 (L)  21 - 32 mmol/L       Anion gap  10  6 - 15 mmol/L       Glucose  93  70 - 110 mg/dL       BUN  11  7 - 18 MG/DL       Creatinine  9.13  0.60 - 1.30 MG/DL       BUN/Creatinine ratio  13  7 - 25          GFR est AA  >60  >60 ml/min/1.92m2       GFR est non-AA  >60  >60 ml/min/1.41m2       Calcium  9.2  8.5 - 10.1 MG/DL       Bilirubin, total  0.4  <1.1 MG/DL       ALT (SGPT)  19  12 - 78 U/L       AST (SGOT)  13 (L)  15 - 37 U/L       Alk. phosphatase  104  45 - 117 U/L       Protein, total  7.1  6.4 - 8.2 g/dL       Albumin  4.0  3.4 - 5.0 g/dL       Globulin  3.1  2.4 - 3.5 g/dL            A-G Ratio  0.0 (L)  1.2 - 2.2             Immunizations administered during this encounter:    There is no immunization history on file for this patient.      Screening for Metabolic Disorders for Patients on Antipsychotic Medications   (Data obtained from the EMR)      Estimated Body Mass Index   Estimated body mass index is 22.27 kg/m as calculated from the following:     Height as of this encounter: 5' 5 (1.651 m).     Weight as of this encounter: 60.7 kg (133 lb 12.8 oz).       Vital Signs/Blood Pressure   Visit Vitals      BP  107/67 (BP 1 Location: Left arm,  BP Patient Position: Sitting)     Pulse  89     Temp  99 F (37.2 C)     Resp  18     Ht  5' 5 (1.651 m)     Wt  60.7 kg (133 lb 12.8 oz)     SpO2  100%        BMI  22.27 kg/m           Hemoglobin A1c   No results found for: HBA1C, HGBE8, HBA1CEXT, HBA1CEXT       Lipid Panel     Lab Results         Component  Value  Date/Time            Cholesterol, total  183  08/17/2017 07:39 AM       HDL Cholesterol  34  08/17/2017 07:39 AM       LDL, calculated  122.76  08/17/2017 07:39 AM            Triglyceride  164 (H)  08/17/2017 07:39 AM           Discharge Diagnosis: Bipolar disorder with psychotic features.  Suicidal ideation      Discharge Plan: Follow-up with [facility].                                Pt declined substance abuse follow up.      Discharge Medication List and Instructions:      Discharge Medication List as of 08/20/2017  3:48 PM              START taking these medications          Details        multivitamin (ONE A DAY) tablet  Take 1 Tab  by mouth daily. Indications: Treatment To Prevent Vitamin Deficiency, Print, Disp-30 Tab, R-1               divalproex DR (DEPAKOTE) 500 mg tablet  Take 1 Tab by mouth two (2) times a day. Indications: Bipolar I Disorder with Most Recent Episode Mixed, Print, Disp-30 Tab, R-1               buPROPion (WELLBUTRIN) 75 mg tablet  Take 0.5 Tabs by mouth two (2) times a day. Indications: Bipolar Depression, Print, Disp-15 Tab, R-1                     CONTINUE these medications which have CHANGED          Details        traZODone (DESYREL) 100 mg tablet  Take 1 Tab by mouth nightly as needed for Sleep. Indications: major depressive disorder, Print, Disp-15 Tab, R-1               fluticasone-Salmeterol (ADVAIR HFA) 45-21 mcg/actuation inhaler  Take 2 Puffs by inhalation two (2) times a day. Indications: Controller Medication for Asthma, Print, Disp-1 Inhaler, R-0               haloperidol (HALDOL) 5 mg tablet  Take 1 Tab by mouth two (2) times a day. Indications: Psychosis, Print, Disp-30 Tab, R-1                     CONTINUE these medications which have NOT CHANGED          Details  diphenoxylate-atropine (LOMOTIL) 2.5-0.025 mg per tablet  Take 2 Tabs by mouth., Historical Med               pregabalin (LYRICA) 200 mg capsule  Take 200 mg by mouth three (3) times daily., Historical Med               QUEtiapine (SEROQUEL) 300 mg tablet  take 2 tablets by mouth at bedtime, Historical Med, R-0               medroxyPROGESTERone (DEPO-PROVERA) 150 mg/mL injection  150 mg by IntraMUSCular route once., Historical Med                     STOP taking these medications                  SUBOXONE 8-2 mg film sublingaul film  Comments:    Reason for Stopping:                               Unresulted Labs  (24h ago, onward)          None               To obtain results of studies pending at discharge, please contact [phone]        Follow-up Information               Follow up With  Specialties  Details  Why  Contact Info               [facility]     Go to  walk-in days for intake are Mondays, Tuesdays, and Thursdays, 8-3:45.  [address]    [place] [address]   P [phone]      F [phone]                 Latisha Cones, NP  Nurse Practitioner      [address]   [place] [facility] [address]   [phone]                       Pt declined substance abuse follow up                  Advanced Directive:    Does the patient have an appointed surrogate decision maker?  No   Does the patient have a Medical Advance Directive? No   Does the patient have a Psychiatric Advance Directive? No   If the patient does not have a surrogate or Medical Advance Directive AND Psychiatric Advance Directive, the patient was offered information on these advance directives  Patient declined to complete         Patient Instructions: Please continue all medications until otherwise directed  by physician.       Diet- Regular   Activity as tolerated   Blood glucose was fasting upon this admission. Yes   Psychiatric Advanced Care Directives- No   Patient was offered information, patient declined.   POA:  None      Keep all follow up appointments as scheduled, continue to take prescribed medications per physician instructions.      Suicide Hotline Number [phone]   24 hour/7 day a week contact phone number [phone]      Tobacco Cessation Discharge Plan:    Is the patient a smoker and needs referral for smoking cessation? No  Patient referred to the following for smoking cessation with an appointment? Not applicable      Patient was offered medication to assist with smoking cessation at discharge? Not applicable   Was education for smoking cessation added to the discharge instructions? Not applicable      Alcohol/Substance Abuse Discharge Plan:    Does the patient have a history of substance/alcohol abuse and requires a referral for treatment? No   Patient referred to the following for substance/alcohol abuse treatment with an  appointment? Not applicable   Patient was offered medication to assist with alcohol cessation at discharge? Not applicable   Was education for substance/alcohol abuse added to discharge instructions? Not applicable      Patient discharged to Home; discussed with patient/caregiver and provided to the patient/caregiver either in hard copy or electronically.

## 2017-08-20 NOTE — Behavioral Health Treatment Team (Signed)
 [facility]  INPATIENT BEHAVIORAL HEALTH  NURSING PROGRESS NOTE        BEHAVIORAL ASSESSMENT    Patient is oriented to person, place, and time. Patient rated anxiety as an 8/10. Patient denied depression. Patient denied AH and VH. Patient denied SI and HI. Patient was compliant with medication. Patient did not display aggressive behavior during the shift. Patient was cooperative with assessment and maintained good eye contact.     Sleep Assessment    Hours nightly slept: 9 Hours   Difficulty Falling Asleep: No  Difficulty Staying Asleep: No  Broken Sleep: No  Clinician's Observations: Patient slept from 21:00- 06:00    Appetite Assessment    Percentage of Patient's Food Consumption: 100%  Clinician's Observations: Patient had an evening snack.     COLUMBIA-SUICIDE SEVERITY RATING SCALE  Daily/Shift Screen         Ask questions that are bold and underlined Since Last Asked   Ask Question 2* YES NO   2. Suicidal Thoughts:     Since you were last asked, have you actually had thoughts about killing yourself?   No   If YES to 2, ask questions 3, 4, 5, and 6.  If NO to 2, go directly to question 6   3)   Suicidal Thoughts with Method (without Specific Plan or Intent to Act):     Have you been thinking about how you might do this?      4)   Suicidal Intent (without Specific Plan):     Have you had these thoughts and had some intention of acting on them?      5)   Suicide Intent with Specific Plan:     Have you started to work out or worked out the details of how to kill yourself? Do you intend to carry out this plan?      6)   Suicide Behavior    Have you done anything, started to do anything, or prepared to do anything to end your life?    Examples: Collected pills, obtained a gun, gave away valuables, wrote a will or suicide note, took out pills but didn't swallow any, held a gun but changed your mind or it was grabbed from your hand, went to the roof but didn't jump; or actually took  pills, tried to shoot yourself, cut yourself, tried to hang yourself, etc.  If YES, what did you do?____________________________________________  _______________________________________________________________  No     * Note - for frequent assessment purposes, Question 1 has been omitted     If the patient came in suicidal and now denies ask the patient what has changed and list response: Patient denied SI.     NURSING GROUP    Topic of Group: A.A.  Leader of Group: [employer] Time: 19:30  Stop Time: 20:00  Patient Participation: Patient did not attend group.     INTERVENTIONS    Treatment Plan Problem List & Correlating Goals/Interventions    Problem # Problem   1 Risk of Harm to Self   2 Depression   3 Anxiety   4 Paranoia          CLIENT RESPONSE TO TREATMENT    Describe Patient Response to Master Treatment Plan's Goals/Interventions: Patient denied SI. Patient denied depression. Patient rated anxiety 8/10. Patient did not display signs of paranoia.     Patient Vitals for the past 12 hrs:   Temp Pulse Resp BP SpO2  08/19/17 1814 98.5 F (36.9 C) 92 20 108/62 99 %   08/19/17 1524 98.3 F (36.8 C) (!) 101 20 108/64 96 %

## 2017-08-20 NOTE — Other (Signed)
Pt stated that she will be a new client at [facility]. Pt is aware that her intake will be completed as a walk-in. Pt is aware of walk-in days for intake (Mondays, Tuesdays, and Thursdays, 8-3:45).    [facility]   [address]   [place] [id number]  P [phone]     F [phone]

## 2017-08-20 NOTE — Behavioral Health Treatment Team (Signed)
Behavioral Health Transition Record to Provider    Patient Name: Angela Bishop  Date of Birth: [birth year]  Medical Record Number: [id number]  Date of Admission: 08/16/2017  Date of Discharge: 08/20/2017    Attending Provider: No att. providers found  Discharging Provider: Jeris Penta, MD  To contact this individual call [phone] and ask the operator to page.  If unavailable, ask to be transferred to [facility] Provider on call. A Behavioral Health Provider will be available on call 24/7 and during holidays     Primary Care Provider: Waynard Edwards, NP    Allergies   Allergen Reactions   ??? Fentanyl Swelling   ??? Reglan [Metoclopramide] Other (comments)   ??? Toradol [Ketorolac] Swelling   ??? Ultram [Tramadol] Nausea and Vomiting   ??? Zofran [Ondansetron Hcl] Other (comments)       Admission Diagnosis: Suicidal ideations [R45.851]  Suicidal ideation [R45.851]  Met with pt in ED bed 19 for intake assessment. Pt came to ED for SI with plan to cut wrists.   * No surgery found *    Results for orders placed or performed during the hospital encounter of 08/16/17   ACETAMINOPHEN   Result Value Ref Range    Acetaminophen level <10 (L) 10 - 30 ug/mL   CBC WITH AUTOMATED DIFF   Result Value Ref Range    WBC 10.2 4.5 - 10.8 K/uL    RBC 4.36 4.2 - 5.4 M/uL    HGB 14.2 12.0 - 16.0 g/dL    HCT 41.3 41 - 53 %    MCV 94.7 80 - 100 FL    MCH 32.6 (H) 27 - 31 PG    MCHC 34.4 31 - 37 g/dL    RDW 12.5 11.5 - 14.5 %    PLATELET 285 130 - 400 K/uL    MPV 10.1 5.9 - 10.3 FL    NEUTROPHILS 69 40 - 74 %    LYMPHOCYTES 19 19 - 48 %    MONOCYTES 10 (H) 3 - 9 %    EOSINOPHILS 1 0 - 5 %    BASOPHILS 1 0 - 2 %    ABS. NEUTROPHILS 7.1 1.5 - 8.0 K/UL    ABS. LYMPHOCYTES 2.0 0.8 - 3.5 K/UL    ABS. MONOCYTES 1.0 0.8 - 3.5 K/UL    ABS. EOSINOPHILS 0.1 0.0 - 0.5 K/UL    ABS. BASOPHILS 0.1 0.0 - 0.1 K/UL    DF AUTOMATED      IMMATURE GRANULOCYTES 0 (L) 2 - 10 %    ABS. IMM. GRANS. 0.0 K/UL   DRUG SCREEN, URINE    Result Value Ref Range    METHADONE NEGATIVE       PCP(PHENCYCLIDINE) NEGATIVE       BENZODIAZEPINES NEGATIVE       COCAINE NEGATIVE       AMPHETAMINES POSITIVE      OPIATES NEGATIVE       BARBITURATES NEGATIVE       TRICYCLICS POSITIVE      THC (TH-CANNABINOL) NEGATIVE      ETHYL ALCOHOL   Result Value Ref Range    ALCOHOL(ETHYL),SERUM None detected MG/DL   METABOLIC PANEL, COMPREHENSIVE   Result Value Ref Range    Sodium 141 136 - 145 mmol/L    Potassium 3.6 3.5 - 5.3 mmol/L    Chloride 111 (H) 98 - 107 mmol/L    CO2 19 (L) 21 - 32 mmol/L    Anion gap 11 6 -  15 mmol/L    Glucose 90 70 - 110 mg/dL    BUN 13 7 - 18 MG/DL    Creatinine 0.98 0.60 - 1.30 MG/DL    BUN/Creatinine ratio 13 7 - 25      GFR est AA >60 >60 ml/min/1.9m    GFR est non-AA >60 >60 ml/min/1.755m   Calcium 9.1 8.5 - 10.1 MG/DL    Bilirubin, total 0.7 <1.1 MG/DL    ALT (SGPT) 36 12 - 78 U/L    AST (SGOT) 15 15 - 37 U/L    Alk. phosphatase 118 (H) 45 - 117 U/L    Protein, total 7.6 6.4 - 8.2 g/dL    Albumin 3.9 3.4 - 5.0 g/dL    Globulin 3.7 (H) 2.4 - 3.5 g/dL    A-G Ratio 1.1 (L) 1.2 - 2.2     SALICYLATE   Result Value Ref Range    Salicylate level <2<6.9L) 2.8 - 20.0 MG/DL   URINALYSIS W/ RFLX MICROSCOPIC   Result Value Ref Range    Color AMBER      Appearance CLOUDY      Specific gravity >=1.030 1.002 - 1.030      pH (UA) 6.0 4.5 - 8.0      Protein TRACE (A) mg/dL    Glucose NEGATIVE  mg/dL    Ketone 40 (A) NEG mg/dL    Bilirubin NEGATIVE       Blood NEGATIVE       Urobilinogen 1.0 0 - 1 EU/dL    Nitrites NEGATIVE       Leukocyte Esterase SMALL (A)      WBC 10-20 0 - 2 /hpf    RBC 0-2 0 - 2 /hpf    Bacteria 2+ /hpf    Hyaline cast 10-20 /lpf    Epithelial cells 10-20 /lpf   THYROID PANEL W/TSH   Result Value Ref Range    TSH 0.94 0.35 - 3.74 uIU/mL    T4, Total 9.5 4.7 - 13.3 ug/dL    T3 Uptake 31 31 - 39 %    Free thyroxine index 2.9 1.4 - 5.2     HCG QL SERUM   Result Value Ref Range    HCG, Ql. NEGATIVE      ANTIDEPRESSANT PANEL, UR    Result Value Ref Range    Tricyclic Antidepressants NEGATIVE  Cutoff=100 ng/mL    Please note Comment     FOLATE   Result Value Ref Range    Folate >20.0 (H) 3.1 - 17.5 ng/mL   LIPID PANEL   Result Value Ref Range    Triglyceride 164 (H) <150 MG/DL    Cholesterol, total 183 <200 MG/DL    HDL Cholesterol 34 32 - 96 MG/DL    LDL, calculated 122.76 <130 MG/DL    VLDL, calculated 32.8 (H) 5 - 32 MG/DL   VITAMIN B12   Result Value Ref Range    Vitamin B12 285 254 - 1,320 pg/mL   VALPROIC ACID   Result Value Ref Range    Valproic acid 66 50 - 10678g/ml   METABOLIC PANEL, COMPREHENSIVE   Result Value Ref Range    Sodium 142 136 - 145 mmol/L    Potassium 4.0 3.5 - 5.3 mmol/L    Chloride 112 (H) 98 - 107 mmol/L    CO2 20 (L) 21 - 32 mmol/L    Anion gap 10 6 - 15 mmol/L    Glucose 93 70 - 110 mg/dL  BUN 11 7 - 18 MG/DL    Creatinine 0.86 0.60 - 1.30 MG/DL    BUN/Creatinine ratio 13 7 - 25      GFR est AA >60 >60 ml/min/1.64m    GFR est non-AA >60 >60 ml/min/1.725m   Calcium 9.2 8.5 - 10.1 MG/DL    Bilirubin, total 0.4 <1.1 MG/DL    ALT (SGPT) 19 12 - 78 U/L    AST (SGOT) 13 (L) 15 - 37 U/L    Alk. phosphatase 104 45 - 117 U/L    Protein, total 7.1 6.4 - 8.2 g/dL    Albumin 4.0 3.4 - 5.0 g/dL    Globulin 3.1 2.4 - 3.5 g/dL    A-G Ratio 0.0 (L) 1.2 - 2.2         Immunizations administered during this encounter:   There is no immunization history on file for this patient.    Screening for Metabolic Disorders for Patients on Antipsychotic Medications  (Data obtained from the EMR)    Estimated Body Mass Index  Estimated body mass index is 22.27 kg/m?? as calculated from the following:    Height as of this encounter: '5\' 5"'  (1.651 m).    Weight as of this encounter: 60.7 kg (133 lb 12.8 oz).     Vital Signs/Blood Pressure  Visit Vitals  BP 107/67 (BP 1 Location: Left arm, BP Patient Position: Sitting)   Pulse 89   Temp 99 ??F (37.2 ??C)   Resp 18   Ht '5\' 5"'  (1.651 m)   Wt 60.7 kg (133 lb 12.8 oz)   SpO2 100%   BMI 22.27 kg/m??        Hemoglobin A1c  No results found for: HBA1C, HGBE8, HBA1CEXT, HBA1CEXT     Lipid Panel  Lab Results   Component Value Date/Time    Cholesterol, total 183 08/17/2017 07:39 AM    HDL Cholesterol 34 08/17/2017 07:39 AM    LDL, calculated 122.76 08/17/2017 07:39 AM    Triglyceride 164 (H) 08/17/2017 07:39 AM       Discharge Diagnosis: Bipolar disorder with psychotic features.  Suicidal ideation    [facility]                              Pt declined substance abuse follow up.    Discharge Medication List and Instructions:   Discharge Medication List as of 08/20/2017  3:48 PM      START taking these medications    Details   multivitamin (ONE A DAY) tablet Take 1 Tab by mouth daily. Indications: Treatment To Prevent Vitamin Deficiency, Print, Disp-30 Tab, R-1      divalproex DR (DEPAKOTE) 500 mg tablet Take 1 Tab by mouth two (2) times a day. Indications: Bipolar I Disorder with Most Recent Episode Mixed, Print, Disp-30 Tab, R-1      buPROPion (WELLBUTRIN) 75 mg tablet Take 0.5 Tabs by mouth two (2) times a day. Indications: Bipolar Depression, Print, Disp-15 Tab, R-1         CONTINUE these medications which have CHANGED    Details   traZODone (DESYREL) 100 mg tablet Take 1 Tab by mouth nightly as needed for Sleep. Indications: major depressive disorder, Print, Disp-15 Tab, R-1      fluticasone-Salmeterol (ADVAIR HFA) 45-21 mcg/actuation inhaler Take 2 Puffs by inhalation two (2) times a day. Indications: Controller Medication for Asthma, Print, Disp-1 Inhaler, R-0  haloperidol (HALDOL) 5 mg tablet Take 1 Tab by mouth two (2) times a day. Indications: Psychosis, Print, Disp-30 Tab, R-1         CONTINUE these medications which have NOT CHANGED    Details   diphenoxylate-atropine (LOMOTIL) 2.5-0.025 mg per tablet Take 2 Tabs by mouth., Historical Med      pregabalin (LYRICA) 200 mg capsule Take 200 mg by mouth three (3) times daily., Historical Med       QUEtiapine (SEROQUEL) 300 mg tablet take 2 tablets by mouth at bedtime, Historical Med, R-0      medroxyPROGESTERone (DEPO-PROVERA) 150 mg/mL injection 150 mg by IntraMUSCular route once., Historical Med         STOP taking these medications       SUBOXONE 8-2 mg film sublingaul film Comments:   Reason for Stopping:               Unresulted Labs (24h ago, onward)    None        To obtain results of studies pending at discharge, please contact [phone]    Follow-up Information     Follow up With Specialties Details [facility] to walk-in days for intake are Mondays, Tuesdays, and Thursdays, 8-3:45. [place] [address]  P [phone]     F [phone]      Waynard Edwards, NP Nurse Practitioner   [address]  [place] [address]  [phone]         Pt declined substance abuse follow up           Advanced Directive:   Does the patient have an appointed surrogate decision maker? No  Does the patient have a Medical Advance Directive? No  Does the patient have a Psychiatric Advance Directive? No  If the patient does not have a surrogate or Medical Advance Directive AND Psychiatric Advance Directive, the patient was offered information on these advance directives Patient declined to complete      Patient Instructions: Please continue all medications until otherwise directed by physician.     Diet- Regular  Activity as tolerated  Blood glucose was fasting upon this admission. Yes  Psychiatric Advanced Care Directives- No  Patient was offered information, patient declined.  POA:  None    Keep all follow up appointments as scheduled, continue to take prescribed medications per physician instructions.    Suicide Hotline Number [phone]  89 hour/7 day a week contact phone number [phone]    Tobacco Cessation Discharge Plan:   Is the patient a smoker and needs referral for smoking cessation? No   Patient referred to the following for smoking cessation with an appointment? Not applicable     Patient was offered medication to assist with smoking cessation at discharge? Not applicable  Was education for smoking cessation added to the discharge instructions? Not applicable    Alcohol/Substance Abuse Discharge Plan:   Does the patient have a history of substance/alcohol abuse and requires a referral for treatment? No  Patient referred to the following for substance/alcohol abuse treatment with an appointment? Not applicable  Patient was offered medication to assist with alcohol cessation at discharge? Not applicable  Was education for substance/alcohol abuse added to discharge instructions? Not applicable    Patient discharged to Home; discussed with patient/caregiver and provided to the patient/caregiver either in hard copy or electronically.

## 2017-08-20 NOTE — Behavioral Health Treatment Team (Signed)
[facility]  INPATIENT BEHAVIORAL HEALTH  NURSING PROGRESS NOTE        BEHAVIORAL ASSESSMENT    Patient is oriented to person, place, and time. Patient rated anxiety as an 8/10. Patient denied depression. Patient denied AH and VH. Patient denied SI and HI. Patient was compliant with medication. Patient did not display aggressive behavior during the shift. Patient was cooperative with assessment and maintained good eye contact.     Sleep Assessment    Hours nightly slept: 9 Hours   Difficulty Falling Asleep: No  Difficulty Staying Asleep: No  Broken Sleep: No  Clinician's Observations: Patient slept from 21:00- 06:00    Appetite Assessment    Percentage of Patient's Food Consumption: 100%  Clinician's Observations: Patient had an evening snack.     COLUMBIA-SUICIDE SEVERITY RATING SCALE  Daily/Shift Screen  ??       Ask questions that are bold and underlined Since Last Asked   Ask Question 2* YES NO   2. Suicidal Thoughts:   ??  Since you were last asked, have you actually had thoughts about killing yourself?  ?? No   If YES to 2, ask questions 3, 4, 5, and 6.  If NO to 2, go directly to question 6   3)   Suicidal Thoughts with Method (without Specific Plan or Intent to Act):   ??  Have you been thinking about how you might do this?  ?? ??   4)   Suicidal Intent (without Specific Plan):   ??  Have you had these thoughts and had some intention of acting on them?  ?? ??   5)   Suicide Intent with Specific Plan:   ??  Have you started to work out or worked out the details of how to kill yourself? Do you intend to carry out this plan?  ?? ??   6)   Suicide Behavior  ??  Have you done anything, started to do anything, or prepared to do anything to end your life?  ??  Examples: Collected pills, obtained a gun, gave away valuables, wrote a will or suicide note, took out pills but didn???t swallow any, held a gun but changed your mind or it was grabbed from your hand, went to the roof  but didn???t jump; or actually took pills, tried to shoot yourself, cut yourself, tried to hang yourself, etc.  If YES, what did you do?____________________________________________  _______________________________________________________________ ?? No   ??  * Note ??? for frequent assessment purposes, Question 1 has been omitted   ??  If the patient came in suicidal and now denies ask the patient what has changed and list response: Patient denied SI.   ??  NURSING GROUP    Topic of Group: A.A.  Leader of Group: [employer] Time: 19:30  Stop Time: 20:00  Patient Participation: Patient did not attend group.     INTERVENTIONS    Treatment Plan Problem List & Correlating Goals/Interventions    Problem # Problem   1 Risk of Harm to Self   2 Depression   3 Anxiety   4 Paranoia          CLIENT RESPONSE TO TREATMENT    Describe Patient Response to Master Treatment Plan's Goals/Interventions: Patient denied SI. Patient denied depression. Patient rated anxiety 8/10. Patient did not display signs of paranoia.     Patient Vitals for the past 12 hrs:   Temp Pulse Resp BP SpO2  08/19/17 1814 98.5 ??F (36.9 ??C) 92 20 108/62 99 %   08/19/17 1524 98.3 ??F (36.8 ??C) (!) 101 20 108/64 96 %

## 2017-08-20 NOTE — Other (Signed)
[facility]  Group Note  [address]  [place], [place] ??[address]  ??  ??  Date of service:??08/20/17  ??  Start time:??10am  Stop time:??11am  ??  Type of session:??Pt will attend scheduled recreation group and request and listen to songs that have a positive meaning to them and share with the group why,??while also socializing appropriately with AT,PCT and other pt's.  ??  Problem number:??[employer] DO  ??  Short term goal (STG):??Pt will participate in scheduled activity to assist pt in reaching goal and improving overall mood.  ??  Intervention/techniques: Guided, Listened/Empathized, Observed/Monitored and Promoted Peer Support  ??  Patient mental status/affect:??Calm  ??  Patient behavior/appearance: Fairly Groomed  ??  Special patient treatment accommodations provided (describe): None needed  ??  Patient response and progress towards goals: Pt is responding toward goal by attending group and participating in activity.  ??  ??  Lance B Sydnor

## 2017-08-20 NOTE — Other (Signed)
Despite prompting, Pt declined 1-2pm group. Therapist will continue to  encourage group participation.

## 2017-08-20 NOTE — Discharge Summary (Signed)
[place]    Name:  Angela Bishop, Angela Bishop  MR#:   [id number]  DOB:  May 01, 1985  ACCOUNT #:  [id number]  ADMIT DATE:  08/16/2017  DISCHARGE DATE:  08/20/2017    REASON FOR PSYCHIATRIC HOSPITALIZATION:  This is a 32 year old Caucasian female, who is single, unemployed, living by herself apparently with a prior history of treatment for bipolar disorder, admitted because of depressed mood and suicidal thoughts.    HISTORY AND PHYSICAL:  Please refer to dictated history and physical on the electronic medical record performed by this writer for details of chief complaint, history of present illness, past psychiatric history, past medical history, personal and social history, and mental status examination on admission.    DSM DIAGNOSES ON ADMISSION:  Please refer to electronic medical record for details of this.    HOSPITAL COURSE:  The patient was admitted to the [facility], managed as a case of bipolar disorder with psychotic features; stimulant use disorder, severe; unspecified anxiety disorder.  The patient was placed on standard monitoring, standard precautions, and placed on standard p.r.n. orders.  She received group therapy, participated in group and unit activities.  She received multidisciplinary team care including therapy milieu.  She was discussed regularly with staffing during the week.  Her medication was verified from pharmacy and she was restarted back on medication, namely Motrin 400 mg p.o. q.6 h, Lyrica 200 mg p.o. three times daily, Haldol 5 mg p.o. b.i.d., Flonase two puffs b.i.d., Depakote 500 mg p.o. b.i.d.  Wellbutrin was added to her medication at the dose of 37.5 mg p.o. b.i.d. to help with depression.  She was also placed on standard p.r.n. orders and also placed on multivitamin one tablet daily.  She showed steady improvement with hospitalization.  Depression improved, psychosis improved, psychosis  resolved.  Her mood became stable.  She verbalized feeling hopeful for the future.  She is sleeping through the night.  She adamantly denies suicidal thoughts.  She denies homicidal thoughts.  She denies hallucination.  She denies delusions.  She tolerated medications with no complaints or side effects.  She verbalized feeling hopeful for the future.  She eventually met the maximum benefit of acute inpatient behavioral healthcare.  Further benefit will be met on outpatient basis.  The patient was subsequently discharged in good condition.  Immediate suicide risk at time of discharge is low.  Potential for violence is negative.    MENTAL STATUS EXAMINATION ON DISCHARGE:  A young woman looking her stated age, dressed appropriate to weather with fair grooming and hygiene.  She appears anxious, cooperative, appropriate in behavior.  Her speech is spontaneous, clear, and coherent.  Her mood is stable.  Affect is full range.  Thoughts are logical mostly and goal directed.  She adamantly denies suicidal thoughts.  She denies homicidal thoughts.  She denies hallucination.  She denied delusions.  She denies any paranoid or persecutory ideation.  She is alert, awake, oriented to time, place, and person.  She has good attentional span.  She has a fairly good insight and judgment.    DSM DIAGNOSES ON DISCHARGE:  AXIS I:  Bipolar disorder with psychotic features; stimulant use disorder, severe; unspecified anxiety disorder, rule out panic disorder.  AXIS II:  Deferred.  AXIS III:  History of fibromyalgia, short gut syndrome; multiple abdominal surgeries; allergy to fentanyl, Toradol, tramadol, vancomycin, Reglan, and Zofran.  AXIS IV:  Poor coping skills, poor problem-solving skills, chemical dependency, chronic, severe and  persistent mental illness.  AXIS V:  Global Assessment of Functioning of 55-60.    DISCHARGE DIET:  Regular.    DISCHARGE ACTIVITY:  As tolerated.     DISCHARGE FOLLOWUP:  For Psychiatry, the patient will follow up with [facility].  For Medical, the patient will follow up with primary care physician.  The patient is to call 911 or go to ED for any recurrence of symptoms or if suicidal or homicidal.  The patient verbalized adequate understanding.  All precautions were discontinued on discharge.    DISCHARGE MEDICATIONS:  Multivitamin one tablet daily, Depakote 500 mg p.o. b.i.d., Wellbutrin 37.5 mg p.o. b.i.d., trazodone 100 mg p.o. at bedtime, Advair two puffs by inhalation twice daily, Haldol 5 mg p.o. b.i.d., Lomotil 5 mg p.o. daily, Lyrica 200 mg p.o. three times daily, trazodone 150 mg p.o. at bedtime.    All precautions were discontinued on discharge.      Jeris Penta, MD      [id number]  D:  08/21/2017 16:47  T:  08/21/2017 17:50  JOB #:  [id number]

## 2017-08-20 NOTE — Behavioral Health Treatment Team (Signed)
Behavioral Health Transition Record to Provider    Patient Name: Angela Bishop  Date of Birth: 12/24/1985  Medical Record Number: [id number]  Date of Admission: 08/16/2017  Date of Discharge: 08/20/2017    Attending Provider: Jeris Penta, MD  Discharging Provider: Jeris Penta, MD  To contact this individual call [phone] and ask the operator to page.  If unavailable, ask to be transferred to [facility] Provider on call. A Behavioral Health Provider will be available on call 24/7 and during holidays     Primary Care Provider: Waynard Edwards, NP    Allergies   Allergen Reactions   ??? Fentanyl Swelling   ??? Reglan [Metoclopramide] Other (comments)   ??? Toradol [Ketorolac] Swelling   ??? Ultram [Tramadol] Nausea and Vomiting   ??? Zofran [Ondansetron Hcl] Other (comments)       Admission Diagnosis: Suicidal ideations [R45.851]  Suicidal ideation [R45.851]  Met with pt in ED bed 19 for intake assessment. Pt came to ED for SI with plan to cut wrists.   * No surgery found *    Results for orders placed or performed during the hospital encounter of 08/16/17   ACETAMINOPHEN   Result Value Ref Range    Acetaminophen level <10 (L) 10 - 30 ug/mL   CBC WITH AUTOMATED DIFF   Result Value Ref Range    WBC 10.2 4.5 - 10.8 K/uL    RBC 4.36 4.2 - 5.4 M/uL    HGB 14.2 12.0 - 16.0 g/dL    HCT 41.3 41 - 53 %    MCV 94.7 80 - 100 FL    MCH 32.6 (H) 27 - 31 PG    MCHC 34.4 31 - 37 g/dL    RDW 12.5 11.5 - 14.5 %    PLATELET 285 130 - 400 K/uL    MPV 10.1 5.9 - 10.3 FL    NEUTROPHILS 69 40 - 74 %    LYMPHOCYTES 19 19 - 48 %    MONOCYTES 10 (H) 3 - 9 %    EOSINOPHILS 1 0 - 5 %    BASOPHILS 1 0 - 2 %    ABS. NEUTROPHILS 7.1 1.5 - 8.0 K/UL    ABS. LYMPHOCYTES 2.0 0.8 - 3.5 K/UL    ABS. MONOCYTES 1.0 0.8 - 3.5 K/UL    ABS. EOSINOPHILS 0.1 0.0 - 0.5 K/UL    ABS. BASOPHILS 0.1 0.0 - 0.1 K/UL    DF AUTOMATED      IMMATURE GRANULOCYTES 0 (L) 2 - 10 %    ABS. IMM. GRANS. 0.0 K/UL   DRUG SCREEN, URINE    Result Value Ref Range    METHADONE NEGATIVE       PCP(PHENCYCLIDINE) NEGATIVE       BENZODIAZEPINES NEGATIVE       COCAINE NEGATIVE       AMPHETAMINES POSITIVE      OPIATES NEGATIVE       BARBITURATES NEGATIVE       TRICYCLICS POSITIVE      THC (TH-CANNABINOL) NEGATIVE      ETHYL ALCOHOL   Result Value Ref Range    ALCOHOL(ETHYL),SERUM None detected MG/DL   METABOLIC PANEL, COMPREHENSIVE   Result Value Ref Range    Sodium 141 136 - 145 mmol/L    Potassium 3.6 3.5 - 5.3 mmol/L    Chloride 111 (H) 98 - 107 mmol/L    CO2 19 (L) 21 - 32 mmol/L    Anion gap 11 6 -  15 mmol/L    Glucose 90 70 - 110 mg/dL    BUN 13 7 - 18 MG/DL    Creatinine 0.98 0.60 - 1.30 MG/DL    BUN/Creatinine ratio 13 7 - 25      GFR est AA >60 >60 ml/min/1.26m    GFR est non-AA >60 >60 ml/min/1.739m   Calcium 9.1 8.5 - 10.1 MG/DL    Bilirubin, total 0.7 <1.1 MG/DL    ALT (SGPT) 36 12 - 78 U/L    AST (SGOT) 15 15 - 37 U/L    Alk. phosphatase 118 (H) 45 - 117 U/L    Protein, total 7.6 6.4 - 8.2 g/dL    Albumin 3.9 3.4 - 5.0 g/dL    Globulin 3.7 (H) 2.4 - 3.5 g/dL    A-G Ratio 1.1 (L) 1.2 - 2.2     SALICYLATE   Result Value Ref Range    Salicylate level <2<3.1L) 2.8 - 20.0 MG/DL   URINALYSIS W/ RFLX MICROSCOPIC   Result Value Ref Range    Color AMBER      Appearance CLOUDY      Specific gravity >=1.030 1.002 - 1.030      pH (UA) 6.0 4.5 - 8.0      Protein TRACE (A) mg/dL    Glucose NEGATIVE  mg/dL    Ketone 40 (A) NEG mg/dL    Bilirubin NEGATIVE       Blood NEGATIVE       Urobilinogen 1.0 0 - 1 EU/dL    Nitrites NEGATIVE       Leukocyte Esterase SMALL (A)      WBC 10-20 0 - 2 /hpf    RBC 0-2 0 - 2 /hpf    Bacteria 2+ /hpf    Hyaline cast 10-20 /lpf    Epithelial cells 10-20 /lpf   THYROID PANEL W/TSH   Result Value Ref Range    TSH 0.94 0.35 - 3.74 uIU/mL    T4, Total 9.5 4.7 - 13.3 ug/dL    T3 Uptake 31 31 - 39 %    Free thyroxine index 2.9 1.4 - 5.2     HCG QL SERUM   Result Value Ref Range    HCG, Ql. NEGATIVE      ANTIDEPRESSANT PANEL, UR    Result Value Ref Range    Tricyclic Antidepressants NEGATIVE  Cutoff=100 ng/mL    Please note Comment     FOLATE   Result Value Ref Range    Folate >20.0 (H) 3.1 - 17.5 ng/mL   LIPID PANEL   Result Value Ref Range    Triglyceride 164 (H) <150 MG/DL    Cholesterol, total 183 <200 MG/DL    HDL Cholesterol 34 32 - 96 MG/DL    LDL, calculated 122.76 <130 MG/DL    VLDL, calculated 32.8 (H) 5 - 32 MG/DL   VITAMIN B12   Result Value Ref Range    Vitamin B12 285 254 - 1,5,176g/mL   METABOLIC PANEL, COMPREHENSIVE   Result Value Ref Range    Sodium 142 136 - 145 mmol/L    Potassium 4.0 3.5 - 5.3 mmol/L    Chloride 112 (H) 98 - 107 mmol/L    CO2 20 (L) 21 - 32 mmol/L    Anion gap 10 6 - 15 mmol/L    Glucose 93 70 - 110 mg/dL    BUN 11 7 - 18 MG/DL    Creatinine 0.86 0.60 - 1.30 MG/DL  BUN/Creatinine ratio 13 7 - 25      GFR est AA >60 >60 ml/min/1.61m    GFR est non-AA >60 >60 ml/min/1.766m   Calcium 9.2 8.5 - 10.1 MG/DL    Bilirubin, total 0.4 <1.1 MG/DL    ALT (SGPT) 19 12 - 78 U/L    AST (SGOT) 13 (L) 15 - 37 U/L    Alk. phosphatase 104 45 - 117 U/L    Protein, total 7.1 6.4 - 8.2 g/dL    Albumin 4.0 3.4 - 5.0 g/dL    Globulin 3.1 2.4 - 3.5 g/dL    A-G Ratio 0.0 (L) 1.2 - 2.2         Immunizations administered during this encounter:   There is no immunization history on file for this patient.    Screening for Metabolic Disorders for Patients on Antipsychotic Medications  (Data obtained from the EMR)    Estimated Body Mass Index  Estimated body mass index is 22.27 kg/m?? as calculated from the following:    Height as of this encounter: 5' 5"  (1.651 m).    Weight as of this encounter: 60.7 kg (133 lb 12.8 oz).     Vital Signs/Blood Pressure  Visit Vitals  BP 107/67 (BP 1 Location: Left arm, BP Patient Position: Sitting)   Pulse 89   Temp 99 ??F (37.2 ??C)   Resp 18   Ht 5' 5"  (1.651 m)   Wt 60.7 kg (133 lb 12.8 oz)   SpO2 100%   BMI 22.27 kg/m??       Hemoglobin A1c  No results found for: HBA1C, HGBE8, HBA1CEXT      Lipid Panel  Lab Results   Component Value Date/Time    Cholesterol, total 183 08/17/2017 07:39 AM    HDL Cholesterol 34 08/17/2017 07:39 AM    LDL, calculated 122.76 08/17/2017 07:39 AM    Triglyceride 164 (H) 08/17/2017 07:39 AM       Discharge Diagnosis: Bipolar disorder with psychotic features.  Suicidal ideation    Discharge Plan: Follow-up with [facility]  Discharge Medication List and Instructions:   Current Discharge Medication List      START taking these medications    Details   multivitamin (ONE A DAY) tablet Take 1 Tab by mouth daily. Indications: Treatment To Prevent Vitamin Deficiency  Qty: 30 Tab, Refills: 1      divalproex DR (DEPAKOTE) 500 mg tablet Take 1 Tab by mouth two (2) times a day. Indications: Bipolar I Disorder with Most Recent Episode Mixed  Qty: 30 Tab, Refills: 1      buPROPion (WELLBUTRIN) 75 mg tablet Take 0.5 Tabs by mouth two (2) times a day. Indications: Bipolar Depression  Qty: 15 Tab, Refills: 1         CONTINUE these medications which have CHANGED    Details   traZODone (DESYREL) 100 mg tablet Take 1 Tab by mouth nightly as needed for Sleep. Indications: major depressive disorder  Qty: 15 Tab, Refills: 1      fluticasone-Salmeterol (ADVAIR HFA) 45-21 mcg/actuation inhaler Take 2 Puffs by inhalation two (2) times a day. Indications: Controller Medication for Asthma  Qty: 1 Inhaler, Refills: 0      haloperidol (HALDOL) 5 mg tablet Take 1 Tab by mouth two (2) times a day. Indications: Psychosis  Qty: 30 Tab, Refills: 1         CONTINUE these medications which have NOT CHANGED    Details   diphenoxylate-atropine (  LOMOTIL) 2.5-0.025 mg per tablet Take 2 Tabs by mouth.      pregabalin (LYRICA) 200 mg capsule Take 200 mg by mouth three (3) times daily.      medroxyPROGESTERone (DEPO-PROVERA) 150 mg/mL injection 150 mg by IntraMUSCular route once.         STOP taking these medications       SUBOXONE 8-2 mg film sublingaul film Comments:   Reason for Stopping:                Unresulted Labs (24h ago, onward)    None        To obtain results of studies pending at discharge, please contact [phone]    Follow-up Information     Follow up With Specialties Details [facility] [place] to walk-in days for intake are Mondays, Tuesdays, and Thursdays, 8-3:45. [place] [address]  P [phone]     F [phone]      Waynard Edwards, NP Nurse Practitioner   [place]  [place] [address]  [phone]            Advanced Directive:   Does the patient have an appointed surrogate decision maker? No  Does the patient have a Medical Advance Directive? No  Does the patient have a Psychiatric Advance Directive? No  If the patient does not have a surrogate or Medical Advance Directive AND Psychiatric Advance Directive, the patient was offered information on these advance directives Patient declined to complete      Patient Instructions: Please continue all medications until otherwise directed by physician.     Diet- Regular  Activity as tolerated  Blood glucose was fasting upon this admission. Yes  Psychiatric Advanced Care Directives- No  Patient was offered information, patient declined.  POA:  None    Keep all follow up appointments as scheduled, continue to take prescribed medications per physician instructions.    Suicide Hotline Number 1-607-371-0626  94 hour/7 day a week contact phone number [phone]    Tobacco Cessation Discharge Plan:   Is the patient a smoker and needs referral for smoking cessation? No  Patient referred to the following for smoking cessation with an appointment? Not applicable     Patient was offered medication to assist with smoking cessation at discharge? Not applicable  Was education for smoking cessation added to the discharge instructions? Not applicable    Alcohol/Substance Abuse Discharge Plan:   Does the patient have a history of substance/alcohol abuse and requires a referral for treatment? No   Patient referred to the following for substance/alcohol abuse treatment with an appointment? Not applicable  Patient was offered medication to assist with alcohol cessation at discharge? Not applicable  Was education for substance/alcohol abuse added to discharge instructions? Not applicable    Patient discharged to Home; discussed with patient/caregiver and provided to the patient/caregiver either in hard copy or electronically.

## 2017-08-20 NOTE — Other (Signed)
[facility]  [facility]  [address]  [place], [id number]  ??  ??  Date of service:??08/20/17  ??  Start time:??3pm  Stop time:??4pm  ??  Type of session:??Pt will attend scheduled recreation group and participate in listening to music for relaxation and stress relief and eating popcorn??while also socializing appropriately with AT,PCT and other pt's.  ??  Problem number:??[employer] DO  ??  Short term goal (STG):??Pt will participate in scheduled activity to assist pt in reaching goal and improving overall mood.  ??  Intervention/techniques: Guided, Listened/Empathized, Observed/Monitored and Promoted Peer Support  ??  Patient mental status/affect:??Calm  ??  Patient behavior/appearance: Fairly Groomed  ??  Special patient treatment accommodations provided (describe): None needed  ??  Patient response and progress towards goals: Pt is responding toward goal by attending group and participating in activity.  ??  ??  Domenick GongLance B Sydnor

## 2017-08-20 NOTE — Behavioral Health Treatment Team (Signed)
SUICIDE RE-ASSESSMENT  In the past 12 hours have you had thoughts of hurting yourself:  no  On scale of 1-10 how does patient rate suicidal thoughts: 1  ?  On the nurses scale of 1-5 where does your patient rate 1= no suicidal ideations, 2= vague thoughts, 3= strong thoughts with no plan; 4= thoughts with plan low lethality high chance of rescue; 5= strong thoughts with plan and means to carry out plan high lethality low chance of rescue)           Orders received to discharge patient to home per Dr. Oloworaran.  Discharge instructions, healthy lifestyle, medications and follow up appointments reviewed with patient.  Patient verbalized understanding.  Pt will discharge to home with follow up at [facility],  appointment dates and times reviewed, patient verbalized understanding. AVS and BH Transition Record  Was completed, hard copy given to patient. Prescriptions for mv, depakote, trazodone, wellbutrin, advair, haldol Given to patient. All personal belongings obtained and verified with patient, discharge instructions and AVS signed.  No items in safe. AVS and BH Transition Record information reviewed with patient.  Patient  provided with Behavioral Health phone number [phone] and instructed to talk  with Charge Nurse for any needs 24 hours a day /7days a week.Patient discharged via front hallway in stable condition with all belongings. Patient left building with taxi. AVS and BH Transition faxed to the next level of care.  Suicide Hotline Number 1-800-784-2433  24 hour/7 day a week contact phone number [phone]

## 2017-08-20 NOTE — Discharge Summary (Signed)
DC Summary already dictated [id number]

## 2017-08-20 NOTE — Other (Signed)
Patient did not attend 11 AM therapy group on this date despite prompting. Will continue to encourage attendance and participation.

## 2017-08-20 NOTE — Progress Notes (Signed)
Shift assessment complete.  AAO. Walking in the hallway. Seems anxious but rates anxiety 0/10, depression 0/10, denies suicidal or homicidal thoughts.

## 2017-08-20 NOTE — Other (Signed)
[facility]  BE[facility]AV[facility]  [facility] GROUP NOTE    Sanjuana LettersStephenie Straub    GROUP SESSION Date: 08/20/2017 Start Time: 0900  Stop Time: 1000 Type: Spiritual Group     Intervention/Techniques:    Validated/Supported, Guided and Prompted/Cued     Patient Behavior & Appearance: Cooperative and Needed Prompting     Special Patient Treatment Accommodations Provided: n/a     Group Topic/Patient Response:     The topic of the day was religious objects/reflection. The patient made several remarks, regarding her childhood faith. The patient supported her peers with some appropriate spiritual feedback and encouragement.              Plan:Continue spiritual inquiry through conversation, reflection and prayer.            Chaplain Signature Chaplain Printed Name & Credential Date: 08/20/2017 Time:  1200

## 2017-08-22 LAB — DRUG SCREEN, URINE
AMPHETAMINES: POSITIVE
Amphetamine Screen, Urine: POSITIVE
BARBITURATES: NEGATIVE
BENZODIAZEPINES: NEGATIVE
Barbiturate Screen, Urine: NEGATIVE
Benzodiazepine Screen, Urine: NEGATIVE
COCAINE: NEGATIVE
Cocaine Screen Urine: NEGATIVE
METHADONE: NEGATIVE
Methadone Screen, Urine: NEGATIVE
OPIATES: NEGATIVE
Opiate Screen, Urine: NEGATIVE
PCP Screen, Urine: NEGATIVE
PCP(PHENCYCLIDINE): NEGATIVE
THC (TH-CANNABINOL): NEGATIVE
THC Screen, Urine: NEGATIVE
TRICYCLICS,TCAT: POSITIVE
TRICYCLICS: POSITIVE

## 2017-08-22 LAB — AMPHETAMINES CONFIRM, URINE
Amphetamine confirm.: >4000 ng/mL
Amphetamine: POSITIVE — AB
Amphetamines: POSITIVE — AB
Methamphetamine confirm.: >4000 ng/mL
Methamphetamine: POSITIVE — AB

## 2017-10-16 ENCOUNTER — Encounter: Attending: Internal Medicine | Primary: Student in an Organized Health Care Education/Training Program

## 2017-10-23 ENCOUNTER — Ambulatory Visit: Attending: Nurse Practitioner

## 2017-10-23 ENCOUNTER — Inpatient Hospital Stay: Admit: 2017-10-24 | Payer: MEDICARE | Primary: Student in an Organized Health Care Education/Training Program

## 2017-10-23 ENCOUNTER — Ambulatory Visit
Admit: 2017-10-23 | Discharge: 2017-10-23 | Payer: MEDICARE | Attending: Nurse Practitioner | Primary: Student in an Organized Health Care Education/Training Program

## 2017-10-23 DIAGNOSIS — M797 Fibromyalgia: Secondary | ICD-10-CM

## 2017-10-23 DIAGNOSIS — Z8739 Personal history of other diseases of the musculoskeletal system and connective tissue: Secondary | ICD-10-CM

## 2017-10-23 MED ORDER — TIZANIDINE 4 MG TAB
4 mg | ORAL_TABLET | Freq: Every evening | ORAL | 0 refills | Status: DC
Start: 2017-10-23 — End: 2017-11-19

## 2017-10-23 NOTE — Progress Notes (Signed)
Progress  Notes by Molli Barrows at 10/23/17 0830                Author: Molli Barrows  Service: --  Author Type: Nurse Practitioner       Filed: 10/23/17 1042  Encounter Date: 10/23/2017  Status: Signed          Editor: Daivd Council 32 y.o.  female is here today to establish care. She has an extensive history of bipolar I disorder and narcotic addiction.  Will need medical records before prescribing her anything as her history is complex and a poor historian with history of drug abuse        Subjective: fibromyalgia     Symptoms have been present for several years.    Admits to > 11 tender pressure points in symmetric distribution involving bilateral shoulders/hips/knees and paraspinal muscles along axial skeleton.    Sleeping well: No   Active: No   Medications appear effective: No, On her medical record AND the patient states she takes Lyrica 200 mg TID but has been out of it for several weeks          Subjective: rheumatoid arthritis     Symptoms have been present for several years.   Admits to joint pain, joint swelling, morning stiffness, afternoon fatigue, weakness of hands and sleep difficulties.    Denies fevers/chills/night sweats/other systemic complaints.    Followed by Rheumatology: NO   On biological agent/immunosuppressant: NO   Current medications appear effective: NO   There is no diagnostic information available on the chart to establish this diagnosis.        Subjective: Short gut syndrome     Symptoms have been present for several years.   Admits to constant diarrhea, states she "eats and goes"    Denies fevers/chills/night sweats/other systemic complaints.   Redundant colon requiring colectomy.    States that with multiple surgeries she became addicted to narcotics and this resulted in her dependence. She is on Suboxone, in therapy currently   Also states she has a "history of chronic pancreatitis"        Subjective:Hepatitis C     RUQ  pain: No   Jaundice: No   Easy bleeding: No   Increased abdominal girth:No   Never been treated was told she didn't need treatment but would like referral to GI to follow      Review of Systems - 14 point review of systems negative, other than noted in HPI   Social History     Social History          Socioeconomic History         ?  Marital status:  SINGLE              Spouse name:  Not on file         ?  Number of children:  Not on file     ?  Years of education:  Not on file     ?  Highest education level:  Not on file       Occupational History        ?  Not on file       Social Needs         ?  Financial resource strain:  Not on file        ?  Food insecurity:  Worry:  Not on file         Inability:  Not on file        ?  Transportation needs:              Medical:  Not on file         Non-medical:  Not on file       Tobacco Use         ?  Smoking status:  Current Every Day Smoker              Packs/day:  1.00         ?  Smokeless tobacco:  Never Used       Substance and Sexual Activity         ?  Alcohol use:  Not Currently     ?  Drug use:  Not Currently     ?  Sexual activity:  Yes       Lifestyle        ?  Physical activity:              Days per week:  Not on file         Minutes per session:  Not on file         ?  Stress:  Not on file       Relationships        ?  Social connections:              Talks on phone:  Not on file         Gets together:  Not on file         Attends religious service:  Not on file         Active member of club or organization:  Not on file         Attends meetings of clubs or organizations:  Not on file         Relationship status:  Not on file        ?  Intimate partner violence:              Fear of current or ex partner:  Not on file         Emotionally abused:  Not on file         Physically abused:  Not on file         Forced sexual activity:  Not on file        Other Topics  Concern        ?  Not on file       Social History Narrative        ?  Not on file         Family History     Family History         Problem  Relation  Age of Onset          ?  Hypertension  Mother       ?  Heart Disease  Mother       ?  Diabetes  Mother       ?  Cancer  Father       ?  Asthma  Brother       ?  Heart Disease  Paternal Grandmother            ?  Heart Disease  Paternal Grandfather          Medication  List     Current Outpatient Medications          Medication  Sig  Dispense  Refill           ?  multivitamin (ONE A DAY) tablet  Take 1 Tab by mouth daily. Indications: Treatment To Prevent Vitamin Deficiency  30 Tab  1           ?  divalproex DR (DEPAKOTE) 500 mg tablet  Take 1 Tab by mouth two (2) times a day. Indications: Bipolar I Disorder with Most Recent Episode Mixed  30 Tab  1     ?  buPROPion (WELLBUTRIN) 75 mg tablet  Take 0.5 Tabs by mouth two (2) times a day. Indications: Bipolar Depression  15 Tab  1     ?  traZODone (DESYREL) 100 mg tablet  Take 1 Tab by mouth nightly as needed for Sleep. Indications: major depressive disorder  15 Tab  1     ?  fluticasone-Salmeterol (ADVAIR HFA) 45-21 mcg/actuation inhaler  Take 2 Puffs by inhalation two (2) times a day. Indications: Controller Medication for Asthma  1 Inhaler  0     ?  haloperidol (HALDOL) 5 mg tablet  Take 1 Tab by mouth two (2) times a day. Indications: Psychosis  30 Tab  1     ?  diphenoxylate-atropine (LOMOTIL) 2.5-0.025 mg per tablet  Take 2 Tabs by mouth.         ?  pregabalin (LYRICA) 200 mg capsule  Take 200 mg by mouth three (3) times daily.         ?  QUEtiapine (SEROQUEL) 300 mg tablet  take 2 tablets by mouth at bedtime    0           ?  medroxyPROGESTERone (DEPO-PROVERA) 150 mg/mL injection  150 mg by IntraMUSCular route once.                     Objective:     Physical Examination:    Visit Vitals      BP  (!) 88/58 (BP 1 Location: Left arm, BP Patient Position: Sitting)     Pulse  98     Temp  98.4 ??F (36.9 ??C) (Tympanic)     Resp  18     Ht  5' 5"  (1.651 m)     Wt  137 lb 9.6 oz (62.4 kg)     LMP  10/24/2014  (Approximate)     SpO2  98%        BMI  22.90 kg/m??         General appearance - alert, well appearing, and in no distress   Mental status - alert, oriented to person, place, and time, normal mood, behavior, speech, dress, motor activity, and thought processes   Eyes - pupils equal and reactive, extraocular eye movements intact   Ears - bilateral TM's and external ear canals normal   Nose - normal and patent, no erythema, discharge or polyps   Mouth - mucous membranes moist, pharynx normal without lesions   Neck - supple, no significant adenopathy, no thyromegaly   Chest - clear to auscultation, no wheezes, no rales or rhonchi, symmetric air entry   Heart - normal rate, regular rhythm, normal S1, S2, no murmurs/rubs/clicks or gallops   Abdomen - soft, nontender, nondistended, no masses or organomegaly, bowel sounds normal and present   Neurological - alert, oriented, normal speech, no focal findings or movement disorder  noted, cranial nerves II through XII intact, DTR's normal and symmetric, motor and sensory grossly normal bilaterally   Musculoskeletal - no joint tenderness, deformity or swelling   Extremities - peripheral pulses normal, no pedal edema, no clubbing or cyanosis   Skin - normal coloration and turgor, no rashes, no suspicious skin lesions noted       DATA: RECENT LABS/IMAGING     Lab Results         Component  Value  Date/Time            WBC  10.2  08/16/2017 02:27 PM       HGB  14.2  08/16/2017 02:27 PM       HCT  41.3  08/16/2017 02:27 PM       PLATELET  285  08/16/2017 02:27 PM            MCV  94.7  08/16/2017 02:27 PM          Lab Results         Component  Value  Date/Time            Sodium  142  08/20/2017 03:14 PM       Potassium  4.0  08/20/2017 03:14 PM       Chloride  112 (H)  08/20/2017 03:14 PM       CO2  20 (L)  08/20/2017 03:14 PM       Anion gap  10  08/20/2017 03:14 PM       Glucose  93  08/20/2017 03:14 PM       BUN  11  08/20/2017 03:14 PM       Creatinine  0.86  08/20/2017 03:14 PM        BUN/Creatinine ratio  13  08/20/2017 03:14 PM       GFR est AA  >60  08/20/2017 03:14 PM       GFR est non-AA  >60  08/20/2017 03:14 PM       Calcium  9.2  08/20/2017 03:14 PM       Bilirubin, total  0.4  08/20/2017 03:14 PM       AST (SGOT)  13 (L)  08/20/2017 03:14 PM       Alk. phosphatase  104  08/20/2017 03:14 PM       Protein, total  7.1  08/20/2017 03:14 PM       Albumin  4.0  08/20/2017 03:14 PM       Globulin  3.1  08/20/2017 03:14 PM       A-G Ratio  0.0 (L)  08/20/2017 03:14 PM            ALT (SGPT)  19  08/20/2017 03:14 PM        No results found for: HBA1C, HGBE8, HBA1CEXT     Lab Results         Component  Value  Date/Time            TSH  0.94  08/16/2017 02:27 PM       T3 Uptake  31  08/16/2017 02:27 PM       T4, Total  9.5  08/16/2017 02:27 PM            Free thyroxine index  2.9  08/16/2017 02:27 PM          Lab Results         Component  Value  Date/Time  Cholesterol, total  183  08/17/2017 07:39 AM       HDL Cholesterol  34  08/17/2017 07:39 AM       LDL, calculated  122.76  08/17/2017 07:39 AM            Triglyceride  164 (H)  08/17/2017 07:39 AM        No results found for: MCACR, MCA1, MCA2, MCA3, MCAU, MCAU2, MCALPOCT       All recent labs/imaging were reviewed in patient's presence, questions answered.           Assessment/Plan:      Diagnoses and all orders for this visit:      1. Fibromyalgia   -     CBC WITH AUTOMATED DIFF; Future   -     METABOLIC PANEL, COMPREHENSIVE; Future   -     tiZANidine (ZANAFLEX) 4 mg tablet; Take 1 Tab by mouth nightly. Indications: muscle spasm      2. Short gut syndrome      3. History of rheumatoid arthritis   -     ANA BY MULTIPLEX FLOW IA, QL; Future   -     RHEUMATOID FACTOR, QL; Future   -     SED RATE, AUTOMATED; Future      4. Hepatitis C virus infection without hepatic coma, unspecified chronicity   -     HEPATITIS PANEL, ACUTE; Future         - labs today, NEED records       - will prescribe zanaflex at [facility]         Medications, including  risk/benefit were discussed with patient at time of office visit.   All recent labs/imaging were reviewed in patient's presence, questions were answered.   Medication list updated at time of visit (see above).        Patient instruction/counseling           Discussed the patient's BMI with patient. Body mass index is 22.9 kg/m??.   At goal: Yes   Discussed lifestyle management:       The patient was counseled on the dangers of tobacco use, and was advised to quit and reluctant to quit.  Reviewed strategies to maximize success,  including removing cigarettes and smoking materials from environment, stress management, substitution of other forms of reinforcement, support of family/friends and written materials .      KEEP previously scheduled appointment 1 week when the records are available and labs return                     Shawna Orleans, APRN, FNP-C   10/23/2017      Follow-up: keep next scheduled appointment         (electronically signed at close of encounter)

## 2017-10-23 NOTE — Progress Notes (Signed)
Angela Bishop 32 y.o. female is here today to establish care. She has an extensive history of bipolar I disorder and narcotic addiction. Will need medical records before prescribing her anything as her history is complex and a poor historian with history of drug abuse    Subjective: fibromyalgia   Symptoms have been present for several years.   Admits to > 11 tender pressure points in symmetric distribution involving bilateral shoulders/hips/knees and paraspinal muscles along axial skeleton.   Sleeping well: No  Active: No  Medications appear effective: No, On her medical record AND the patient states she takes Lyrica 200 mg TID but has been out of it for several weeks     Subjective: rheumatoid arthritis   Symptoms have been present for several years.  Admits to joint pain, joint swelling, morning stiffness, afternoon fatigue, weakness of hands and sleep difficulties.   Denies fevers/chills/night sweats/other systemic complaints.   Followed by Rheumatology: NO  On biological agent/immunosuppressant: NO  Current medications appear effective: NO  There is no diagnostic information available on the chart to establish this diagnosis.    Subjective: Short gut syndrome   Symptoms have been present for several years.  Admits to constant diarrhea, states she "eats and goes"   Denies fevers/chills/night sweats/other systemic complaints.  Redundant colon requiring colectomy.   States that with multiple surgeries she became addicted to narcotics and this resulted in her dependence. She is on Suboxone, in therapy currently  Also states she has a "history of chronic pancreatitis"    Subjective:Hepatitis C   RUQ pain: No  Jaundice: No  Easy bleeding: No  Increased abdominal girth:No  Never been treated was told she didn't need treatment but would like referral to GI to follow    Review of Systems - 14 point review of systems negative, other than noted in HPI  Social History  Social History     Socioeconomic History    ??? Marital status: SINGLE     Spouse name: Not on file   ??? Number of children: Not on file   ??? Years of education: Not on file   ??? Highest education level: Not on file   Occupational History   ??? Not on file   Social Needs   ??? Financial resource strain: Not on file   ??? Food insecurity:     Worry: Not on file     Inability: Not on file   ??? Transportation needs:     Medical: Not on file     Non-medical: Not on file   Tobacco Use   ??? Smoking status: Current Every Day Smoker     Packs/day: 1.00   ??? Smokeless tobacco: Never Used   Substance and Sexual Activity   ??? Alcohol use: Not Currently   ??? Drug use: Not Currently   ??? Sexual activity: Yes   Lifestyle   ??? Physical activity:     Days per week: Not on file     Minutes per session: Not on file   ??? Stress: Not on file   Relationships   ??? Social connections:     Talks on phone: Not on file     Gets together: Not on file     Attends religious service: Not on file     Active member of club or organization: Not on file     Attends meetings of clubs or organizations: Not on file     Relationship status: Not on file   ??? Intimate  partner violence:     Fear of current or ex partner: Not on file     Emotionally abused: Not on file     Physically abused: Not on file     Forced sexual activity: Not on file   Other Topics Concern   ??? Not on file   Social History Narrative   ??? Not on file     Family History  Family History   Problem Relation Age of Onset   ??? Hypertension Mother    ??? Heart Disease Mother    ??? Diabetes Mother    ??? Cancer Father    ??? Asthma Brother    ??? Heart Disease Paternal Grandmother    ??? Heart Disease Paternal Grandfather      Medication List  Current Outpatient Medications   Medication Sig Dispense Refill   ??? multivitamin (ONE A DAY) tablet Take 1 Tab by mouth daily. Indications: Treatment To Prevent Vitamin Deficiency 30 Tab 1   ??? divalproex DR (DEPAKOTE) 500 mg tablet Take 1 Tab by mouth two (2) times  a day. Indications: Bipolar I Disorder with Most Recent Episode Mixed 30 Tab 1   ??? buPROPion (WELLBUTRIN) 75 mg tablet Take 0.5 Tabs by mouth two (2) times a day. Indications: Bipolar Depression 15 Tab 1   ??? traZODone (DESYREL) 100 mg tablet Take 1 Tab by mouth nightly as needed for Sleep. Indications: major depressive disorder 15 Tab 1   ??? fluticasone-Salmeterol (ADVAIR HFA) 45-21 mcg/actuation inhaler Take 2 Puffs by inhalation two (2) times a day. Indications: Controller Medication for Asthma 1 Inhaler 0   ??? haloperidol (HALDOL) 5 mg tablet Take 1 Tab by mouth two (2) times a day. Indications: Psychosis 30 Tab 1   ??? diphenoxylate-atropine (LOMOTIL) 2.5-0.025 mg per tablet Take 2 Tabs by mouth.     ??? pregabalin (LYRICA) 200 mg capsule Take 200 mg by mouth three (3) times daily.     ??? QUEtiapine (SEROQUEL) 300 mg tablet take 2 tablets by mouth at bedtime  0   ??? medroxyPROGESTERone (DEPO-PROVERA) 150 mg/mL injection 150 mg by IntraMUSCular route once.            Objective:   Physical Examination:   Visit Vitals  BP (!) 88/58 (BP 1 Location: Left arm, BP Patient Position: Sitting)   Pulse 98   Temp 98.4 ??F (36.9 ??C) (Tympanic)   Resp 18   Ht '5\' 5"'  (1.651 m)   Wt 137 lb 9.6 oz (62.4 kg)   LMP 10/24/2014 (Approximate)   SpO2 98%   BMI 22.90 kg/m??      General appearance - alert, well appearing, and in no distress  Mental status - alert, oriented to person, place, and time, normal mood, behavior, speech, dress, motor activity, and thought processes  Eyes - pupils equal and reactive, extraocular eye movements intact  Ears - bilateral TM's and external ear canals normal  Nose - normal and patent, no erythema, discharge or polyps  Mouth - mucous membranes moist, pharynx normal without lesions  Neck - supple, no significant adenopathy, no thyromegaly  Chest - clear to auscultation, no wheezes, no rales or rhonchi, symmetric air entry  Heart - normal rate, regular rhythm, normal S1, S2, no murmurs/rubs/clicks or gallops   Abdomen - soft, nontender, nondistended, no masses or organomegaly, bowel sounds normal and present  Neurological - alert, oriented, normal speech, no focal findings or movement disorder noted, cranial nerves II through XII intact, DTR's normal and symmetric, motor  and sensory grossly normal bilaterally  Musculoskeletal - no joint tenderness, deformity or swelling  Extremities - peripheral pulses normal, no pedal edema, no clubbing or cyanosis  Skin - normal coloration and turgor, no rashes, no suspicious skin lesions noted     DATA: RECENT LABS/IMAGING  Lab Results   Component Value Date/Time    WBC 10.2 08/16/2017 02:27 PM    HGB 14.2 08/16/2017 02:27 PM    HCT 41.3 08/16/2017 02:27 PM    PLATELET 285 08/16/2017 02:27 PM    MCV 94.7 08/16/2017 02:27 PM     Lab Results   Component Value Date/Time    Sodium 142 08/20/2017 03:14 PM    Potassium 4.0 08/20/2017 03:14 PM    Chloride 112 (H) 08/20/2017 03:14 PM    CO2 20 (L) 08/20/2017 03:14 PM    Anion gap 10 08/20/2017 03:14 PM    Glucose 93 08/20/2017 03:14 PM    BUN 11 08/20/2017 03:14 PM    Creatinine 0.86 08/20/2017 03:14 PM    BUN/Creatinine ratio 13 08/20/2017 03:14 PM    GFR est AA >60 08/20/2017 03:14 PM    GFR est non-AA >60 08/20/2017 03:14 PM    Calcium 9.2 08/20/2017 03:14 PM    Bilirubin, total 0.4 08/20/2017 03:14 PM    AST (SGOT) 13 (L) 08/20/2017 03:14 PM    Alk. phosphatase 104 08/20/2017 03:14 PM    Protein, total 7.1 08/20/2017 03:14 PM    Albumin 4.0 08/20/2017 03:14 PM    Globulin 3.1 08/20/2017 03:14 PM    A-G Ratio 0.0 (L) 08/20/2017 03:14 PM    ALT (SGPT) 19 08/20/2017 03:14 PM     No results found for: HBA1C, HGBE8, HBA1CEXT  Lab Results   Component Value Date/Time    TSH 0.94 08/16/2017 02:27 PM    T3 Uptake 31 08/16/2017 02:27 PM    T4, Total 9.5 08/16/2017 02:27 PM    Free thyroxine index 2.9 08/16/2017 02:27 PM     Lab Results   Component Value Date/Time    Cholesterol, total 183 08/17/2017 07:39 AM     HDL Cholesterol 34 08/17/2017 07:39 AM    LDL, calculated 122.76 08/17/2017 07:39 AM    Triglyceride 164 (H) 08/17/2017 07:39 AM     No results found for: MCACR, MCA1, MCA2, MCA3, MCAU, MCAU2, MCALPOCT     All recent labs/imaging were reviewed in patient's presence, questions answered.      Assessment/Plan:    Diagnoses and all orders for this visit:    1. Fibromyalgia  -     CBC WITH AUTOMATED DIFF; Future  -     METABOLIC PANEL, COMPREHENSIVE; Future  -     tiZANidine (ZANAFLEX) 4 mg tablet; Take 1 Tab by mouth nightly. Indications: muscle spasm    2. Short gut syndrome    3. History of rheumatoid arthritis  -     ANA BY MULTIPLEX FLOW IA, QL; Future  -     RHEUMATOID FACTOR, QL; Future  -     SED RATE, AUTOMATED; Future    4. Hepatitis C virus infection without hepatic coma, unspecified chronicity  -     HEPATITIS PANEL, ACUTE; Future       - labs today, NEED records      - will prescribe zanaflex at [facility]       Medications, including risk/benefit were discussed with patient at time of office visit.  All recent labs/imaging were reviewed in patient's presence, questions were answered.  Medication list updated at time of visit (see above).    Patient instruction/counseling       Discussed the patient's BMI with patient. Body mass index is 22.9 kg/m??.  At goal: Yes  Discussed lifestyle management:     The patient was counseled on the dangers of tobacco use, and was advised to quit and reluctant to quit.  Reviewed strategies to maximize success, including removing cigarettes and smoking materials from environment, stress management, substitution of other forms of reinforcement, support of family/friends and written materials.    KEEP previously scheduled appointment 1 week when the records are available and labs return             Shawna Orleans, APRN, FNP-C  10/23/2017    Follow-up: keep next scheduled appointment      (electronically signed at close of encounter)

## 2017-10-24 LAB — COMPREHENSIVE METABOLIC PANEL
ALT: 17 U/L (ref 12–78)
AST: 9 U/L — ABNORMAL LOW (ref 15–37)
Albumin/Globulin Ratio: 1.1 — ABNORMAL LOW (ref 1.2–2.2)
Albumin: 4 g/dL (ref 3.4–5.0)
Alkaline Phosphatase: 92 U/L (ref 45–117)
Anion Gap: 5 mmol/L — ABNORMAL LOW (ref 6–15)
BUN: 10 MG/DL (ref 7–18)
Bun/Cre Ratio: 10 (ref 7–25)
CO2: 25 mmol/L (ref 21–32)
Calcium: 9.1 MG/DL (ref 8.5–10.1)
Chloride: 111 mmol/L — ABNORMAL HIGH (ref 98–107)
Creatinine: 0.97 MG/DL (ref 0.60–1.30)
EGFR IF NonAfrican American: >60 mL/min/{1.73_m2} (ref >60)
GFR African American: >60 mL/min/{1.73_m2} (ref >60)
Globulin: 3.5 g/dL (ref 2.4–3.5)
Glucose: 62 mg/dL — ABNORMAL LOW (ref 70–110)
Potassium: 3.9 mmol/L (ref 3.5–5.3)
Sodium: 141 mmol/L (ref 136–145)
Total Bilirubin: 0.6 MG/DL (ref <1.1)
Total Protein: 7.5 g/dL (ref 6.4–8.2)

## 2017-10-24 LAB — CBC WITH AUTO DIFFERENTIAL
Basophils %: 1 % (ref 0–2)
Basophils Absolute: 0.1 10*3/uL (ref 0.0–0.1)
Eosinophils %: 2 % (ref 0–5)
Eosinophils Absolute: 0.2 10*3/uL (ref 0.0–0.5)
Granulocyte Absolute Count: 0 10*3/uL
Hematocrit: 46.6 % (ref 41–53)
Hemoglobin: 15.3 g/dL (ref 12.0–16.0)
Immature Granulocytes: 0 % — ABNORMAL LOW (ref 2–10)
Lymphocytes %: 26 % (ref 19–48)
Lymphocytes Absolute: 1.9 10*3/uL (ref 0.8–3.5)
MCH: 32.1 PG — ABNORMAL HIGH (ref 27–31)
MCHC: 32.8 g/dL (ref 31–37)
MCV: 97.9 FL (ref 80–100)
MPV: 11.7 FL — ABNORMAL HIGH (ref 5.9–10.3)
Monocytes %: 12 % — ABNORMAL HIGH (ref 3–9)
Monocytes Absolute: 0.9 10*3/uL (ref 0.8–3.5)
Neutrophils %: 59 % (ref 40–74)
Neutrophils Absolute: 4.4 10*3/uL (ref 1.5–8.0)
Platelets: 309 10*3/uL (ref 130–400)
RBC: 4.76 M/uL (ref 4.2–5.4)
RDW: 13.1 % (ref 11.5–14.5)
WBC: 7.4 10*3/uL (ref 4.5–10.8)

## 2017-10-24 LAB — RHEUMATOID FACTOR, QUALITATIVE: Rheumatoid Factor, Qualitative: NEGATIVE

## 2017-10-24 LAB — SEDIMENTATION RATE, AUTOMATED: Sed Rate: 1 mm/hr (ref 0–25)

## 2017-10-24 LAB — CBC WITH AUTOMATED DIFF
ABS. BASOPHILS: 0.1 10*3/uL (ref 0.0–0.1)
ABS. EOSINOPHILS: 0.2 10*3/uL (ref 0.0–0.5)
ABS. IMM. GRANS.: 0 10*3/uL
ABS. LYMPHOCYTES: 1.9 10*3/uL (ref 0.8–3.5)
ABS. MONOCYTES: 0.9 10*3/uL (ref 0.8–3.5)
ABS. NEUTROPHILS: 4.4 10*3/uL (ref 1.5–8.0)
BASOPHILS: 1 % (ref 0–2)
EOSINOPHILS: 2 % (ref 0–5)
HCT: 46.6 % (ref 41–53)
HGB: 15.3 g/dL (ref 12.0–16.0)
IMMATURE GRANULOCYTES: 0 % — ABNORMAL LOW (ref 2–10)
LYMPHOCYTES: 26 % (ref 19–48)
MCH: 32.1 PG — ABNORMAL HIGH (ref 27–31)
MCHC: 32.8 g/dL (ref 31–37)
MCV: 97.9 FL (ref 80–100)
MONOCYTES: 12 % — ABNORMAL HIGH (ref 3–9)
MPV: 11.7 FL — ABNORMAL HIGH (ref 5.9–10.3)
NEUTROPHILS: 59 % (ref 40–74)
PLATELET: 309 10*3/uL (ref 130–400)
RBC: 4.76 M/uL (ref 4.2–5.4)
RDW: 13.1 % (ref 11.5–14.5)
WBC: 7.4 10*3/uL (ref 4.5–10.8)

## 2017-10-24 LAB — METABOLIC PANEL, COMPREHENSIVE
A-G Ratio: 1.1 — ABNORMAL LOW (ref 1.2–2.2)
ALT (SGPT): 17 U/L (ref 12–78)
AST (SGOT): 9 U/L — ABNORMAL LOW (ref 15–37)
Albumin: 4 g/dL (ref 3.4–5.0)
Alk. phosphatase: 92 U/L (ref 45–117)
Anion gap: 5 mmol/L — ABNORMAL LOW (ref 6–15)
BUN/Creatinine ratio: 10 (ref 7–25)
BUN: 10 MG/DL (ref 7–18)
Bilirubin, total: 0.6 MG/DL (ref <1.1)
CO2: 25 mmol/L (ref 21–32)
Calcium: 9.1 MG/DL (ref 8.5–10.1)
Chloride: 111 mmol/L — ABNORMAL HIGH (ref 98–107)
Creatinine: 0.97 MG/DL (ref 0.60–1.30)
GFR est AA: >60 mL/min/{1.73_m2} (ref >60)
GFR est non-AA: >60 mL/min/{1.73_m2} (ref >60)
Globulin: 3.5 g/dL (ref 2.4–3.5)
Glucose: 62 mg/dL — ABNORMAL LOW (ref 70–110)
Potassium: 3.9 mmol/L (ref 3.5–5.3)
Protein, total: 7.5 g/dL (ref 6.4–8.2)
Sodium: 141 mmol/L (ref 136–145)

## 2017-10-24 LAB — RHEUMATOID FACTOR, QL: Rheumatoid factor, QL: NEGATIVE

## 2017-10-24 LAB — SED RATE, AUTOMATED: Sed rate, automated: 1 mm/hr (ref 0–25)

## 2017-10-25 LAB — HEPATITIS PANEL, ACUTE
HCV Ab: REACTIVE — AB
Hep A IgM: NONREACTIVE
Hep B Core Ab, IgM: NONREACTIVE
Hep B Surface Ag: NONREACTIVE
Hep Bs Ag: NONREACTIVE
Hepatitis A, IgM: NONREACTIVE
Hepatitis B core, IgM: NONREACTIVE
Hepatitis C virus Ab: REACTIVE — AB

## 2017-10-25 LAB — ANA BY MULTIPLEX FLOW IA, QL
ANA, Direct: NEGATIVE
ANA: NEGATIVE

## 2017-10-25 LAB — HEPATITIS C VIRUS AB VERIFICATION
HCV ANTIBODY VERIFICATION, 144091: REACTIVE — AB
Hep C virus Ab verification: REACTIVE — AB

## 2017-10-26 NOTE — Telephone Encounter (Signed)
Patient called wanting to know if you had reviewed her records and would refill her lyrica

## 2017-10-30 ENCOUNTER — Encounter: Attending: Nurse Practitioner | Primary: Student in an Organized Health Care Education/Training Program

## 2017-11-02 ENCOUNTER — Ambulatory Visit: Attending: Nurse Practitioner

## 2017-11-02 ENCOUNTER — Ambulatory Visit
Admit: 2017-11-02 | Discharge: 2017-11-02 | Payer: MEDICARE | Attending: Nurse Practitioner | Primary: Student in an Organized Health Care Education/Training Program

## 2017-11-02 DIAGNOSIS — M797 Fibromyalgia: Secondary | ICD-10-CM

## 2017-11-02 MED ORDER — AMOXICILLIN CLAVULANATE 875 MG-125 MG TAB
875-125 mg | ORAL_TABLET | Freq: Two times a day (BID) | ORAL | 0 refills | Status: AC
Start: 2017-11-02 — End: 2017-11-12

## 2017-11-02 MED ORDER — PREGABALIN 150 MG CAP
150 mg | ORAL_CAPSULE | Freq: Two times a day (BID) | ORAL | 0 refills | Status: DC
Start: 2017-11-02 — End: 2017-11-02

## 2017-11-02 NOTE — Progress Notes (Signed)
Progress  Notes by Molli Barrows at 11/02/17 1515                Author: Molli Barrows  Service: --  Author Type: Nurse Practitioner       Filed: 11/21/17 2022  Encounter Date: 11/02/2017  Status: Signed          Editor: Daivd Council 32 y.o.  female is here today for chronic medical concerns        Subjective: fibromyalgia     Symptoms have been present for several years.    Admits to > 11 tender pressure points in symmetric distribution involving bilateral shoulders/hips/knees and paraspinal muscles along axial skeleton.    Sleeping well: Yes   Active: Yes   Medications appear effective: she had been on Lyrica for several years at 200 mg twice daily prior to moving to this area. Records obtained, reviewed, and scanned to chart          Subjective: Sinusitis     Patient complains of: sinus congestion: Yes , sore throat: Yes, cough: Yes,  LAN: Yes, ear pain/fullness: Yes .   Fever/chills/night sweats/other systemic complaints: No   Onset of symptoms was several days ago,  gradually worsening since that time.      Review of Systems - 14 point review of systems negative, other than noted in HPI   Social History     Social History          Socioeconomic History         ?  Marital status:  SINGLE              Spouse name:  Not on file         ?  Number of children:  Not on file     ?  Years of education:  Not on file     ?  Highest education level:  Not on file       Occupational History        ?  Not on file       Social Needs         ?  Financial resource strain:  Not on file        ?  Food insecurity:              Worry:  Not on file         Inability:  Not on file        ?  Transportation needs:              Medical:  Not on file         Non-medical:  Not on file       Tobacco Use         ?  Smoking status:  Current Every Day Smoker              Packs/day:  1.00         ?  Smokeless tobacco:  Never Used       Substance and Sexual Activity         ?  Alcohol  use:  Not Currently     ?  Drug use:  Not Currently     ?  Sexual activity:  Yes       Lifestyle        ?  Physical activity:  Days per week:  Not on file         Minutes per session:  Not on file         ?  Stress:  Not on file       Relationships        ?  Social connections:              Talks on phone:  Not on file         Gets together:  Not on file         Attends religious service:  Not on file         Active member of club or organization:  Not on file         Attends meetings of clubs or organizations:  Not on file         Relationship status:  Not on file        ?  Intimate partner violence:              Fear of current or ex partner:  Not on file         Emotionally abused:  Not on file         Physically abused:  Not on file         Forced sexual activity:  Not on file        Other Topics  Concern        ?  Not on file       Social History Narrative        ?  Not on file        Family History     Family History         Problem  Relation  Age of Onset          ?  Hypertension  Mother       ?  Heart Disease  Mother       ?  Diabetes  Mother       ?  Cancer  Father       ?  Asthma  Brother       ?  Heart Disease  Paternal Grandmother            ?  Heart Disease  Paternal Grandfather          Medication List     Current Outpatient Medications          Medication  Sig  Dispense  Refill           ?  pregabalin (LYRICA) 150 mg capsule  Take 1 Cap by mouth two (2) times a day. Max Daily Amount: 300 mg. Indications: disorder characterized by stiff, tender & painful muscles  60 Cap  0     ?  traZODone (DESYREL) 300 mg tablet  Take 300 mg by mouth nightly.         ?  QUEtiapine (SEROQUEL) 100 mg tablet  Take 100 mg by mouth daily.         ?  lurasidone HCl (LATUDA PO)  Take  by mouth.         ?  buprenorphine-naloxone 8-2 mg subl  2 Tabs by SubLINGual route daily.         ?  tiZANidine (ZANAFLEX) 4 mg tablet  Take 1 Tab by mouth nightly. Indications: muscle spasm  30 Tab  0     ?  multivitamin (ONE A  DAY) tablet  Take 1 Tab by mouth daily. Indications: Treatment To Prevent Vitamin Deficiency  30 Tab  1     ?  fluticasone-Salmeterol (ADVAIR HFA) 45-21 mcg/actuation inhaler  Take 2 Puffs by inhalation two (2) times a day. Indications: Controller Medication for Asthma  1 Inhaler  0           ?  haloperidol (HALDOL) 5 mg tablet  Take 1 Tab by mouth two (2) times a day. Indications: Psychosis  30 Tab  1           ?  diphenoxylate-atropine (LOMOTIL) 2.5-0.025 mg per tablet  Take 2 Tabs by mouth.                     Objective:     Physical Examination:    Visit Vitals      BP  108/82 (BP 1 Location: Left arm, BP Patient Position: Sitting)     Pulse  95     Temp  97.5 ??F (36.4 ??C) (Tympanic)     Resp  18     Ht  5' 5"  (1.651 m)     Wt  141 lb 3.2 oz (64 kg)     SpO2  97%        BMI  23.50 kg/m??         General appearance - alert, well appearing, and in no distress   Mental status - alert, oriented to person, place, and time, normal mood, behavior, speech, dress, motor activity, and thought processes   Eyes - pupils equal and reactive, extraocular eye movements intact, conjunctiva injected    Ears - bilateral TM's bulging, external ear canals normal   Nose - patent, boggy nasal turbinates with purulent drainage, no polyps   Mouth - mucous membranes moist, pharynx normal without lesions, purulent post-nasal drip   Neck - supple, mild anterior cervical lymphadenopathy, no thyromegaly   Chest - clear to auscultation, no wheezes, no rales or rhonchi, symmetric air entry   Heart - normal rate, regular rhythm, normal S1, S2, no murmurs/rubs/clicks or gallops   Abdomen - soft, nontender, nondistended, no masses or organomegaly, bowel sounds normal and present   Neurological - alert, oriented, normal speech, no focal findings or movement disorder noted, cranial nerves II through XII intact, DTR's normal and symmetric, motor and sensory grossly normal bilaterally   Musculoskeletal - no joint tenderness, deformity or swelling    Extremities - peripheral pulses normal, no pedal edema, no clubbing or cyanosis   Skin - normal coloration and turgor, no rashes, no suspicious skin lesions noted       DATA: RECENT LABS/IMAGING     Lab Results         Component  Value  Date/Time            WBC  7.4  10/23/2017 08:38 AM       HGB  15.3  10/23/2017 08:38 AM       HCT  46.6  10/23/2017 08:38 AM       PLATELET  309  10/23/2017 08:38 AM            MCV  97.9  10/23/2017 08:38 AM          Lab Results         Component  Value  Date/Time            Sodium  141  10/23/2017 08:38 AM       Potassium  3.9  10/23/2017 08:38 AM       Chloride  111 (H)  10/23/2017 08:38 AM       CO2  25  10/23/2017 08:38 AM       Anion gap  5 (L)  10/23/2017 08:38 AM       Glucose  62 (L)  10/23/2017 08:38 AM       BUN  10  10/23/2017 08:38 AM       Creatinine  0.97  10/23/2017 08:38 AM       BUN/Creatinine ratio  10  10/23/2017 08:38 AM       GFR est AA  >60  10/23/2017 08:38 AM       GFR est non-AA  >60  10/23/2017 08:38 AM       Calcium  9.1  10/23/2017 08:38 AM       Bilirubin, total  0.6  10/23/2017 08:38 AM       AST (SGOT)  9 (L)  10/23/2017 08:38 AM       Alk. phosphatase  92  10/23/2017 08:38 AM       Protein, total  7.5  10/23/2017 08:38 AM       Albumin  4.0  10/23/2017 08:38 AM       Globulin  3.5  10/23/2017 08:38 AM       A-G Ratio  1.1 (L)  10/23/2017 08:38 AM            ALT (SGPT)  17  10/23/2017 08:38 AM        No results found for: HBA1C, HGBE8, HBA1CEXT     Lab Results         Component  Value  Date/Time            TSH  0.94  08/16/2017 02:27 PM       T3 Uptake  31  08/16/2017 02:27 PM       T4, Total  9.5  08/16/2017 02:27 PM            Free thyroxine index  2.9  08/16/2017 02:27 PM          Lab Results         Component  Value  Date/Time            Cholesterol, total  183  08/17/2017 07:39 AM       HDL Cholesterol  34  08/17/2017 07:39 AM       LDL, calculated  122.76  08/17/2017 07:39 AM            Triglyceride  164 (H)  08/17/2017 07:39 AM        No results  found for: MCACR, MCA1, MCA2, MCA3, MCAU, MCAU2, MCALPOCT       All recent labs/imaging were reviewed in patient's presence, questions answered.           Assessment/Plan:      Diagnoses and all orders for this visit:      1. Fibromyalgia      2. Hepatitis C virus infection without hepatic coma, unspecified chronicity   -     HEP C GENOTYPE; Future   -     HCV RNA NAA QUALITATIVE; Future      3. Acute maxillary sinusitis, recurrence not specified   -     amoxicillin-clavulanate (AUGMENTIN) 875-125 mg per tablet; Take 1 Tab by mouth two (2) times a day for 10 days.      - review of [place] revealed the  patient is receiving Gabapentin from psychiatry which she failed to disclose. The prescription for Lyrica was  destroyed. Discussed with her she cannot take both, which she acknowledged, and this could be considered a violation of her contract with the [facility] clinic.    - will treat her sinusitis   - will draw Hep C labs for referral       Medications, including risk/benefit were discussed with patient at time of office visit.   All recent labs/imaging were reviewed in patient's presence, questions were answered.   Medication list updated at time of visit (see above).        Patient instruction/counseling        Fibromyalgia   - continue current meds, reviewed importance of quality sleep and maintaining physical activity      Sinusitis   - discharge instructions reviewed with patient. Advised to increase fluid intake, use OTC decongestant/nasal saline irrigation/antihistamine as directed. Advised to seek medical care or call office if develops systemic complaints (ie fever) or does not  feel better after finishing current treatment.         Discussed the patient's BMI with patient. Body mass index is 23.5 kg/m??.   At goal: Yes   Discussed lifestyle management:       The patient was counseled on the dangers of tobacco use, and was advised to quit and reluctant to quit.  Reviewed strategies to maximize success,   including removing cigarettes and smoking materials from environment, stress management, substitution of other forms of reinforcement, support of family/friends and written materials .         KEEP previously scheduled appointment                      Shawna Orleans, APRN, FNP-C   11/02/2017      Follow-up: keep next scheduled appointment         (electronically signed at close of encounter)

## 2017-11-02 NOTE — Progress Notes (Signed)
Angela Bishop 32 y.o. female is here today for chronic medical concerns    Subjective: fibromyalgia   Symptoms have been present for several years.   Admits to > 11 tender pressure points in symmetric distribution involving bilateral shoulders/hips/knees and paraspinal muscles along axial skeleton.   Sleeping well: Yes  Active: Yes  Medications appear effective: she had been on Lyrica for several years at 200 mg twice daily prior to moving to this area. Records obtained, reviewed, and scanned to chart     Subjective: Sinusitis   Patient complains of: sinus congestion: Yes, sore throat: Yes, cough: Yes, LAN: Yes, ear pain/fullness: Yes.  Fever/chills/night sweats/other systemic complaints: No  Onset of symptoms was several days ago, gradually worsening since that time.    Review of Systems - 14 point review of systems negative, other than noted in HPI  Social History  Social History     Socioeconomic History   ??? Marital status: SINGLE     Spouse name: Not on file   ??? Number of children: Not on file   ??? Years of education: Not on file   ??? Highest education level: Not on file   Occupational History   ??? Not on file   Social Needs   ??? Financial resource strain: Not on file   ??? Food insecurity:     Worry: Not on file     Inability: Not on file   ??? Transportation needs:     Medical: Not on file     Non-medical: Not on file   Tobacco Use   ??? Smoking status: Current Every Day Smoker     Packs/day: 1.00   ??? Smokeless tobacco: Never Used   Substance and Sexual Activity   ??? Alcohol use: Not Currently   ??? Drug use: Not Currently   ??? Sexual activity: Yes   Lifestyle   ??? Physical activity:     Days per week: Not on file     Minutes per session: Not on file   ??? Stress: Not on file   Relationships   ??? Social connections:     Talks on phone: Not on file     Gets together: Not on file     Attends religious service: Not on file     Active member of club or organization: Not on file      Attends meetings of clubs or organizations: Not on file     Relationship status: Not on file   ??? Intimate partner violence:     Fear of current or ex partner: Not on file     Emotionally abused: Not on file     Physically abused: Not on file     Forced sexual activity: Not on file   Other Topics Concern   ??? Not on file   Social History Narrative   ??? Not on file     Family History  Family History   Problem Relation Age of Onset   ??? Hypertension Mother    ??? Heart Disease Mother    ??? Diabetes Mother    ??? Cancer Father    ??? Asthma Brother    ??? Heart Disease Paternal Grandmother    ??? Heart Disease Paternal Grandfather      Medication List  Current Outpatient Medications   Medication Sig Dispense Refill   ??? pregabalin (LYRICA) 150 mg capsule Take 1 Cap by mouth two (2) times a day. Max Daily Amount: 300 mg. Indications: disorder characterized by  stiff, tender & painful muscles 60 Cap 0   ??? traZODone (DESYREL) 300 mg tablet Take 300 mg by mouth nightly.     ??? QUEtiapine (SEROQUEL) 100 mg tablet Take 100 mg by mouth daily.     ??? lurasidone HCl (LATUDA PO) Take  by mouth.     ??? buprenorphine-naloxone 8-2 mg subl 2 Tabs by SubLINGual route daily.     ??? tiZANidine (ZANAFLEX) 4 mg tablet Take 1 Tab by mouth nightly. Indications: muscle spasm 30 Tab 0   ??? multivitamin (ONE A DAY) tablet Take 1 Tab by mouth daily. Indications: Treatment To Prevent Vitamin Deficiency 30 Tab 1   ??? fluticasone-Salmeterol (ADVAIR HFA) 45-21 mcg/actuation inhaler Take 2 Puffs by inhalation two (2) times a day. Indications: Controller Medication for Asthma 1 Inhaler 0   ??? haloperidol (HALDOL) 5 mg tablet Take 1 Tab by mouth two (2) times a day. Indications: Psychosis 30 Tab 1   ??? diphenoxylate-atropine (LOMOTIL) 2.5-0.025 mg per tablet Take 2 Tabs by mouth.            Objective:   Physical Examination:   Visit Vitals  BP 108/82 (BP 1 Location: Left arm, BP Patient Position: Sitting)   Pulse 95   Temp 97.5 ??F (36.4 ??C) (Tympanic)   Resp 18    Ht '5\' 5"'$  (1.651 m)   Wt 141 lb 3.2 oz (64 kg)   SpO2 97%   BMI 23.50 kg/m??      General appearance - alert, well appearing, and in no distress  Mental status - alert, oriented to person, place, and time, normal mood, behavior, speech, dress, motor activity, and thought processes  Eyes - pupils equal and reactive, extraocular eye movements intact, conjunctiva injected   Ears - bilateral TM's bulging, external ear canals normal  Nose - patent, boggy nasal turbinates with purulent drainage, no polyps  Mouth - mucous membranes moist, pharynx normal without lesions, purulent post-nasal drip  Neck - supple, mild anterior cervical lymphadenopathy, no thyromegaly  Chest - clear to auscultation, no wheezes, no rales or rhonchi, symmetric air entry  Heart - normal rate, regular rhythm, normal S1, S2, no murmurs/rubs/clicks or gallops  Abdomen - soft, nontender, nondistended, no masses or organomegaly, bowel sounds normal and present  Neurological - alert, oriented, normal speech, no focal findings or movement disorder noted, cranial nerves II through XII intact, DTR's normal and symmetric, motor and sensory grossly normal bilaterally  Musculoskeletal - no joint tenderness, deformity or swelling  Extremities - peripheral pulses normal, no pedal edema, no clubbing or cyanosis  Skin - normal coloration and turgor, no rashes, no suspicious skin lesions noted     DATA: RECENT LABS/IMAGING  Lab Results   Component Value Date/Time    WBC 7.4 10/23/2017 08:38 AM    HGB 15.3 10/23/2017 08:38 AM    HCT 46.6 10/23/2017 08:38 AM    PLATELET 309 10/23/2017 08:38 AM    MCV 97.9 10/23/2017 08:38 AM     Lab Results   Component Value Date/Time    Sodium 141 10/23/2017 08:38 AM    Potassium 3.9 10/23/2017 08:38 AM    Chloride 111 (H) 10/23/2017 08:38 AM    CO2 25 10/23/2017 08:38 AM    Anion gap 5 (L) 10/23/2017 08:38 AM    Glucose 62 (L) 10/23/2017 08:38 AM    BUN 10 10/23/2017 08:38 AM    Creatinine 0.97 10/23/2017 08:38 AM     BUN/Creatinine ratio 10 10/23/2017 08:38 AM  GFR est AA >60 10/23/2017 08:38 AM    GFR est non-AA >60 10/23/2017 08:38 AM    Calcium 9.1 10/23/2017 08:38 AM    Bilirubin, total 0.6 10/23/2017 08:38 AM    AST (SGOT) 9 (L) 10/23/2017 08:38 AM    Alk. phosphatase 92 10/23/2017 08:38 AM    Protein, total 7.5 10/23/2017 08:38 AM    Albumin 4.0 10/23/2017 08:38 AM    Globulin 3.5 10/23/2017 08:38 AM    A-G Ratio 1.1 (L) 10/23/2017 08:38 AM    ALT (SGPT) 17 10/23/2017 08:38 AM     No results found for: HBA1C, HGBE8, HBA1CEXT  Lab Results   Component Value Date/Time    TSH 0.94 08/16/2017 02:27 PM    T3 Uptake 31 08/16/2017 02:27 PM    T4, Total 9.5 08/16/2017 02:27 PM    Free thyroxine index 2.9 08/16/2017 02:27 PM     Lab Results   Component Value Date/Time    Cholesterol, total 183 08/17/2017 07:39 AM    HDL Cholesterol 34 08/17/2017 07:39 AM    LDL, calculated 122.76 08/17/2017 07:39 AM    Triglyceride 164 (H) 08/17/2017 07:39 AM     No results found for: MCACR, MCA1, MCA2, MCA3, MCAU, MCAU2, MCALPOCT     All recent labs/imaging were reviewed in patient's presence, questions answered.      Assessment/Plan:    Diagnoses and all orders for this visit:    1. Fibromyalgia    2. Hepatitis C virus infection without hepatic coma, unspecified chronicity  -     HEP C GENOTYPE; Future  -     HCV RNA NAA QUALITATIVE; Future    3. Acute maxillary sinusitis, recurrence not specified  -     amoxicillin-clavulanate (AUGMENTIN) 875-125 mg per tablet; Take 1 Tab by mouth two (2) times a day for 10 days.    - review [place] revealed the patient is receiving Gabapentin from psychiatry which she failed to disclose. The prescription for Lyrica was destroyed. Discussed with her she cannot take both, which she acknowledged, and this could be considered a violation of her contract with the Sub[facility]one clinic.   - will treat her sinusitis  - will draw Hep C labs for referral      Medications, including risk/benefit were discussed with patient at time of office visit.  All recent labs/imaging were reviewed in patient's presence, questions were answered.  Medication list updated at time of visit (see above).    Patient instruction/counseling     Fibromyalgia  - continue current meds, reviewed importance of quality sleep and maintaining physical activity    Sinusitis  - discharge instructions reviewed with patient. Advised to increase fluid intake, use OTC decongestant/nasal saline irrigation/antihistamine as directed. Advised to seek medical care or call office if develops systemic complaints (ie fever) or does not feel better after finishing current treatment.      Discussed the patient's BMI with patient. Body mass index is 23.5 kg/m??.  At goal: Yes  Discussed lifestyle management:     The patient was counseled on the dangers of tobacco use, and was advised to quit and reluctant to quit.  Reviewed strategies to maximize success, including removing cigarettes and smoking materials from environment, stress management, substitution of other forms of reinforcement, support of family/friends and written materials.      KEEP previously scheduled appointment              Shawna Orleans, APRN, FNP-C  11/02/2017  Follow-up: keep next scheduled appointment      (electronically signed at close of encounter)

## 2017-11-19 ENCOUNTER — Encounter

## 2017-11-19 MED ORDER — TIZANIDINE 4 MG TAB
4 mg | ORAL_TABLET | Freq: Every evening | ORAL | 0 refills | Status: DC
Start: 2017-11-19 — End: 2018-03-26

## 2017-11-21 ENCOUNTER — Ambulatory Visit: Attending: Nurse Practitioner

## 2017-11-21 ENCOUNTER — Ambulatory Visit
Admit: 2017-11-21 | Discharge: 2017-11-21 | Payer: MEDICARE | Attending: Nurse Practitioner | Primary: Student in an Organized Health Care Education/Training Program

## 2017-11-21 DIAGNOSIS — M797 Fibromyalgia: Secondary | ICD-10-CM

## 2017-11-21 MED ORDER — TIZANIDINE 4 MG TAB
4 mg | ORAL_TABLET | ORAL | 0 refills | Status: AC
Start: 2017-11-21 — End: ?

## 2017-11-21 NOTE — Progress Notes (Signed)
Progress  Notes by Shawna Orleans L at 11/21/17 1100                Author: Molli Barrows  Service: --  Author Type: Nurse Practitioner       Filed: 11/22/17 2220  Encounter Date: 11/21/2017  Status: Signed          Editor: Daivd Council 32 y.o.  female is here today for chronic medical concerns        Subjective: fibromyalgia     Symptoms have been present for several years.    Admits to > 11 tender pressure points in symmetric distribution involving bilateral shoulders/hips/knees and paraspinal muscles along axial skeleton.    Sleeping well: Yes   Active: Yes   Medications appear effective: No          Review of Systems - 14 point review of systems negative, other than noted in HPI   Social History     Social History          Socioeconomic History         ?  Marital status:  SINGLE              Spouse name:  Not on file         ?  Number of children:  Not on file     ?  Years of education:  Not on file     ?  Highest education level:  Not on file       Occupational History        ?  Not on file       Social Needs         ?  Financial resource strain:  Not on file        ?  Food insecurity:              Worry:  Not on file         Inability:  Not on file        ?  Transportation needs:              Medical:  Not on file         Non-medical:  Not on file       Tobacco Use         ?  Smoking status:  Current Every Day Smoker              Packs/day:  1.00         ?  Smokeless tobacco:  Never Used       Substance and Sexual Activity         ?  Alcohol use:  Not Currently     ?  Drug use:  Not Currently     ?  Sexual activity:  Yes       Lifestyle        ?  Physical activity:              Days per week:  Not on file         Minutes per session:  Not on file         ?  Stress:  Not on file       Relationships        ?  Social connections:  Talks on phone:  Not on file         Gets together:  Not on file         Attends religious service:  Not on file          Active member of club or organization:  Not on file         Attends meetings of clubs or organizations:  Not on file         Relationship status:  Not on file        ?  Intimate partner violence:              Fear of current or ex partner:  Not on file         Emotionally abused:  Not on file         Physically abused:  Not on file         Forced sexual activity:  Not on file        Other Topics  Concern        ?  Not on file       Social History Narrative        ?  Not on file        Family History     Family History         Problem  Relation  Age of Onset          ?  Hypertension  Mother       ?  Heart Disease  Mother       ?  Diabetes  Mother       ?  Cancer  Father       ?  Asthma  Brother       ?  Heart Disease  Paternal Grandmother            ?  Heart Disease  Paternal Grandfather          Medication List     Current Outpatient Medications          Medication  Sig  Dispense  Refill           ?  tiZANidine (ZANAFLEX) 4 mg tablet  Take 1 Tab by mouth nightly. Indications: muscle spasm  30 Tab  0     ?  traZODone (DESYREL) 300 mg tablet  Take 300 mg by mouth nightly.         ?  QUEtiapine (SEROQUEL) 100 mg tablet  Take 100 mg by mouth daily.         ?  lurasidone HCl (LATUDA PO)  Take  by mouth.         ?  buprenorphine-naloxone 8-2 mg subl  2 Tabs by SubLINGual route daily.         ?  multivitamin (ONE A DAY) tablet  Take 1 Tab by mouth daily. Indications: Treatment To Prevent Vitamin Deficiency  30 Tab  1     ?  fluticasone-Salmeterol (ADVAIR HFA) 45-21 mcg/actuation inhaler  Take 2 Puffs by inhalation two (2) times a day. Indications: Controller Medication for Asthma  1 Inhaler  0     ?  haloperidol (HALDOL) 5 mg tablet  Take 1 Tab by mouth two (2) times a day. Indications: Psychosis  30 Tab  1           ?  diphenoxylate-atropine (LOMOTIL) 2.5-0.025 mg per tablet  Take 2 Tabs by mouth.  Objective:     Physical Examination:    Visit Vitals      BP  112/64 (BP 1 Location: Left arm, BP Patient  Position: Sitting)        Pulse  (!) 103     Temp  98.1 ??F (36.7 ??C) (Tympanic)     Resp  18     Ht  5' 5"  (1.651 m)     Wt  142 lb 12.8 oz (64.8 kg)     SpO2  98%        BMI  23.76 kg/m??         General appearance - alert, well appearing, and in no distress   Mental status - alert, oriented to person, place, and time, normal mood, behavior, speech, dress, motor activity, and thought processes   Eyes - pupils equal and reactive, extraocular eye movements intact   Ears - bilateral TM's and external ear canals normal   Nose - normal and patent, no erythema, discharge or polyps   Mouth - mucous membranes moist, pharynx normal without lesions   Neck - supple, no significant adenopathy, no thyromegaly   Chest - clear to auscultation, no wheezes, no rales or rhonchi, symmetric air entry   Heart - normal rate, regular rhythm, normal S1, S2, no murmurs/rubs/clicks or gallops   Abdomen - soft, nontender, nondistended, no masses or organomegaly, bowel sounds normal and present   Neurological - alert, oriented, normal speech, no focal findings or movement disorder noted, cranial nerves II through XII intact, DTR's normal and symmetric, motor and sensory grossly normal bilaterally   Musculoskeletal - no joint tenderness, deformity or swelling   Extremities - peripheral pulses normal, no pedal edema, no clubbing or cyanosis   Skin - normal coloration and turgor, no rashes, no suspicious skin lesions noted       DATA: RECENT LABS/IMAGING     Lab Results         Component  Value  Date/Time            WBC  7.4  10/23/2017 08:38 AM       HGB  15.3  10/23/2017 08:38 AM       HCT  46.6  10/23/2017 08:38 AM       PLATELET  309  10/23/2017 08:38 AM            MCV  97.9  10/23/2017 08:38 AM          Lab Results         Component  Value  Date/Time            Sodium  141  10/23/2017 08:38 AM       Potassium  3.9  10/23/2017 08:38 AM       Chloride  111 (H)  10/23/2017 08:38 AM       CO2  25  10/23/2017 08:38 AM       Anion gap  5 (L)   10/23/2017 08:38 AM       Glucose  62 (L)  10/23/2017 08:38 AM       BUN  10  10/23/2017 08:38 AM       Creatinine  0.97  10/23/2017 08:38 AM       BUN/Creatinine ratio  10  10/23/2017 08:38 AM       GFR est AA  >60  10/23/2017 08:38 AM       GFR est non-AA  >60  10/23/2017 08:38 AM  Calcium  9.1  10/23/2017 08:38 AM       Bilirubin, total  0.6  10/23/2017 08:38 AM       AST (SGOT)  9 (L)  10/23/2017 08:38 AM       Alk. phosphatase  92  10/23/2017 08:38 AM       Protein, total  7.5  10/23/2017 08:38 AM       Albumin  4.0  10/23/2017 08:38 AM       Globulin  3.5  10/23/2017 08:38 AM       A-G Ratio  1.1 (L)  10/23/2017 08:38 AM            ALT (SGPT)  17  10/23/2017 08:38 AM        No results found for: HBA1C, HGBE8, HBA1CEXT, HBA1CEXT     Lab Results         Component  Value  Date/Time            TSH  0.94  08/16/2017 02:27 PM       T3 Uptake  31  08/16/2017 02:27 PM       T4, Total  9.5  08/16/2017 02:27 PM            Free thyroxine index  2.9  08/16/2017 02:27 PM          Lab Results         Component  Value  Date/Time            Cholesterol, total  183  08/17/2017 07:39 AM       HDL Cholesterol  34  08/17/2017 07:39 AM       LDL, calculated  122.76  08/17/2017 07:39 AM            Triglyceride  164 (H)  08/17/2017 07:39 AM        No results found for: MCACR, MCA1, MCA2, MCA3, MCAU, MCAU2, MCALPOCT       All recent labs/imaging were reviewed in patient's presence, questions answered.           Assessment/Plan:      Diagnoses and all orders for this visit:      1. Fibromyalgia        - needs to follow with psych and drug rehab      The patient presented asking for refill of Zanaflex and increase in the dose. This was just filled on 11/19/2017. The last visit I had to cancel her gabapentin prescription because psychiatry is filling it and she denied this. She tells me this visit  that she returned the gabapentin to the pharmacy and would like me to fill it. When confronted with the [place], she denied getting the  meds from psychiatry to begin with.       Medications, including risk/benefit were discussed with patient at time of office visit.   All recent labs/imaging were reviewed in patient's presence, questions were answered.   Medication list updated at time of visit (see above).        Patient instruction/counseling        Discussed the patient's BMI with patient. Body mass index is 23.76 kg/m??.   At goal: Yes   Discussed lifestyle management:       The patient was counseled on the dangers of tobacco use, and was advised to quit and reluctant to quit.  Reviewed strategies to maximize success,  including removing cigarettes and smoking materials from environment, stress management, substitution of other forms  of reinforcement, support of family/friends and written materials .         Tobacco usage: No       KEEP previously scheduled appointment                 Shawna Orleans, APRN, FNP-C   11/21/2017      Follow-up: keep next scheduled appointment         (electronically signed at close of encounter)

## 2017-11-21 NOTE — Progress Notes (Signed)
Angela Bishop 32 y.o. female is here today for chronic medical concerns    Subjective: fibromyalgia   Symptoms have been present for several years.   Admits to > 11 tender pressure points in symmetric distribution involving bilateral shoulders/hips/knees and paraspinal muscles along axial skeleton.   Sleeping well: Yes  Active: Yes  Medications appear effective: No       Review of Systems - 14 point review of systems negative, other than noted in HPI  Social History  Social History     Socioeconomic History   ??? Marital status: SINGLE     Spouse name: Not on file   ??? Number of children: Not on file   ??? Years of education: Not on file   ??? Highest education level: Not on file   Occupational History   ??? Not on file   Social Needs   ??? Financial resource strain: Not on file   ??? Food insecurity:     Worry: Not on file     Inability: Not on file   ??? Transportation needs:     Medical: Not on file     Non-medical: Not on file   Tobacco Use   ??? Smoking status: Current Every Day Smoker     Packs/day: 1.00   ??? Smokeless tobacco: Never Used   Substance and Sexual Activity   ??? Alcohol use: Not Currently   ??? Drug use: Not Currently   ??? Sexual activity: Yes   Lifestyle   ??? Physical activity:     Days per week: Not on file     Minutes per session: Not on file   ??? Stress: Not on file   Relationships   ??? Social connections:     Talks on phone: Not on file     Gets together: Not on file     Attends religious service: Not on file     Active member of club or organization: Not on file     Attends meetings of clubs or organizations: Not on file     Relationship status: Not on file   ??? Intimate partner violence:     Fear of current or ex partner: Not on file     Emotionally abused: Not on file     Physically abused: Not on file     Forced sexual activity: Not on file   Other Topics Concern   ??? Not on file   Social History Narrative   ??? Not on file     Family History  Family History   Problem Relation Age of Onset    ??? Hypertension Mother    ??? Heart Disease Mother    ??? Diabetes Mother    ??? Cancer Father    ??? Asthma Brother    ??? Heart Disease Paternal Grandmother    ??? Heart Disease Paternal Grandfather      Medication List  Current Outpatient Medications   Medication Sig Dispense Refill   ??? tiZANidine (ZANAFLEX) 4 mg tablet Take 1 Tab by mouth nightly. Indications: muscle spasm 30 Tab 0   ??? traZODone (DESYREL) 300 mg tablet Take 300 mg by mouth nightly.     ??? QUEtiapine (SEROQUEL) 100 mg tablet Take 100 mg by mouth daily.     ??? lurasidone HCl (LATUDA PO) Take  by mouth.     ??? buprenorphine-naloxone 8-2 mg subl 2 Tabs by SubLINGual route daily.     ??? multivitamin (ONE A DAY) tablet Take 1 Tab by  mouth daily. Indications: Treatment To Prevent Vitamin Deficiency 30 Tab 1   ??? fluticasone-Salmeterol (ADVAIR HFA) 45-21 mcg/actuation inhaler Take 2 Puffs by inhalation two (2) times a day. Indications: Controller Medication for Asthma 1 Inhaler 0   ??? haloperidol (HALDOL) 5 mg tablet Take 1 Tab by mouth two (2) times a day. Indications: Psychosis 30 Tab 1   ??? diphenoxylate-atropine (LOMOTIL) 2.5-0.025 mg per tablet Take 2 Tabs by mouth.            Objective:   Physical Examination:   Visit Vitals  BP 112/64 (BP 1 Location: Left arm, BP Patient Position: Sitting)   Pulse (!) 103   Temp 98.1 ??F (36.7 ??C) (Tympanic)   Resp 18   Ht '5\' 5"'  (1.651 m)   Wt 142 lb 12.8 oz (64.8 kg)   SpO2 98%   BMI 23.76 kg/m??      General appearance - alert, well appearing, and in no distress  Mental status - alert, oriented to person, place, and time, normal mood, behavior, speech, dress, motor activity, and thought processes  Eyes - pupils equal and reactive, extraocular eye movements intact  Ears - bilateral TM's and external ear canals normal  Nose - normal and patent, no erythema, discharge or polyps  Mouth - mucous membranes moist, pharynx normal without lesions  Neck - supple, no significant adenopathy, no thyromegaly   Chest - clear to auscultation, no wheezes, no rales or rhonchi, symmetric air entry  Heart - normal rate, regular rhythm, normal S1, S2, no murmurs/rubs/clicks or gallops  Abdomen - soft, nontender, nondistended, no masses or organomegaly, bowel sounds normal and present  Neurological - alert, oriented, normal speech, no focal findings or movement disorder noted, cranial nerves II through XII intact, DTR's normal and symmetric, motor and sensory grossly normal bilaterally  Musculoskeletal - no joint tenderness, deformity or swelling  Extremities - peripheral pulses normal, no pedal edema, no clubbing or cyanosis  Skin - normal coloration and turgor, no rashes, no suspicious skin lesions noted     DATA: RECENT LABS/IMAGING  Lab Results   Component Value Date/Time    WBC 7.4 10/23/2017 08:38 AM    HGB 15.3 10/23/2017 08:38 AM    HCT 46.6 10/23/2017 08:38 AM    PLATELET 309 10/23/2017 08:38 AM    MCV 97.9 10/23/2017 08:38 AM     Lab Results   Component Value Date/Time    Sodium 141 10/23/2017 08:38 AM    Potassium 3.9 10/23/2017 08:38 AM    Chloride 111 (H) 10/23/2017 08:38 AM    CO2 25 10/23/2017 08:38 AM    Anion gap 5 (L) 10/23/2017 08:38 AM    Glucose 62 (L) 10/23/2017 08:38 AM    BUN 10 10/23/2017 08:38 AM    Creatinine 0.97 10/23/2017 08:38 AM    BUN/Creatinine ratio 10 10/23/2017 08:38 AM    GFR est AA >60 10/23/2017 08:38 AM    GFR est non-AA >60 10/23/2017 08:38 AM    Calcium 9.1 10/23/2017 08:38 AM    Bilirubin, total 0.6 10/23/2017 08:38 AM    AST (SGOT) 9 (L) 10/23/2017 08:38 AM    Alk. phosphatase 92 10/23/2017 08:38 AM    Protein, total 7.5 10/23/2017 08:38 AM    Albumin 4.0 10/23/2017 08:38 AM    Globulin 3.5 10/23/2017 08:38 AM    A-G Ratio 1.1 (L) 10/23/2017 08:38 AM    ALT (SGPT) 17 10/23/2017 08:38 AM     No results found for: HBA1C, HGBE8,  HBA1CEXT, HBA1CEXT  Lab Results   Component Value Date/Time    TSH 0.94 08/16/2017 02:27 PM    T3 Uptake 31 08/16/2017 02:27 PM     T4, Total 9.5 08/16/2017 02:27 PM    Free thyroxine index 2.9 08/16/2017 02:27 PM     Lab Results   Component Value Date/Time    Cholesterol, total 183 08/17/2017 07:39 AM    HDL Cholesterol 34 08/17/2017 07:39 AM    LDL, calculated 122.76 08/17/2017 07:39 AM    Triglyceride 164 (H) 08/17/2017 07:39 AM     No results found for: MCACR, MCA1, MCA2, MCA3, MCAU, MCAU2, MCALPOCT     All recent labs/imaging were reviewed in patient's presence, questions answered.      Assessment/Plan:    Diagnoses and all orders for this visit:    1. Fibromyalgia      - needs to follow with psych and drug rehab    The patient presented asking for refill of Zanaflex and increase in the dose. This was just filled on 11/19/2017. The last visit I had to cancel her gabapentin prescription because psychiatry is filling it and she denied this. She tells me this visit that she returned the gabapentin to the pharmacy and would like me to fill it. When confronted with the [place], she denied getting the meds from psychiatry to begin with.     Medications, including risk/benefit were discussed with patient at time of office visit.  All recent labs/imaging were reviewed in patient's presence, questions were answered.  Medication list updated at time of visit (see above).    Patient instruction/counseling     Discussed the patient's BMI with patient. Body mass index is 23.76 kg/m??.  At goal: Yes  Discussed lifestyle management:     The patient was counseled on the dangers of tobacco use, and was advised to quit and reluctant to quit.  Reviewed strategies to maximize success, including removing cigarettes and smoking materials from environment, stress management, substitution of other forms of reinforcement, support of family/friends and written materials.      Tobacco usage: No     KEEP previously scheduled appointment          Shawna Orleans, APRN, FNP-C  11/21/2017    Follow-up: keep next scheduled appointment       (electronically signed at close of encounter)

## 2017-12-17 ENCOUNTER — Encounter: Attending: Internal Medicine | Primary: Student in an Organized Health Care Education/Training Program

## 2018-01-02 ENCOUNTER — Encounter: Attending: Nurse Practitioner | Primary: Student in an Organized Health Care Education/Training Program

## 2018-02-11 ENCOUNTER — Ambulatory Visit: Attending: Internal Medicine

## 2018-02-11 ENCOUNTER — Ambulatory Visit
Admit: 2018-02-11 | Discharge: 2018-02-11 | Payer: MEDICARE | Attending: Internal Medicine | Primary: Student in an Organized Health Care Education/Training Program

## 2018-02-11 DIAGNOSIS — R1319 Other dysphagia: Secondary | ICD-10-CM

## 2018-02-11 DIAGNOSIS — R1310 Dysphagia, unspecified: Secondary | ICD-10-CM

## 2018-02-11 MED ORDER — DIPHENOXYLATE-ATROPINE 2.5 MG-0.025 MG TAB
ORAL_TABLET | Freq: Four times a day (QID) | ORAL | 3 refills | Status: AC
Start: 2018-02-11 — End: 2018-03-13

## 2018-02-11 MED ORDER — OMEPRAZOLE 40 MG CAP, DELAYED RELEASE
40 mg | ORAL_CAPSULE | Freq: Every day | ORAL | 3 refills | Status: AC
Start: 2018-02-11 — End: ?

## 2018-02-11 MED ORDER — POLYETHYLENE GLYCOL 3350 100 % ORAL POWDER
17 gram/dose | ORAL | 0 refills | Status: AC
Start: 2018-02-11 — End: ?

## 2018-02-11 NOTE — Progress Notes (Signed)
[facility] SURGICAL SERVICES CASE [place] [facility]  Phone:  [phone] Fax:  [phone]  Faxes are considered tentatively scheduled until confirmed by the [facility] scheduling office    Surgeon Name: Dr. Rexene Edison  Surgery Date & Time:  Wednesday March 13, 2018 @ 9:00AM  Surgery Location: Endo  Patient Name: Angela Bishop  Patient Date Of Birth: 1985-12-20  Patient Social Security: [id number]   Patient Phone Number: [phone] (home)   Gender:  female  Anesthesia Type: MAC  CPT Code(s):  [id number]. [id number]. [id number]. [id number]. [phone]. [phone]. [phone]. [phone]  Patient Pre-op diagnosis: diarreha: k59.1 dysphagia: r13.10  Procedure: egd/colon  Insurance underwriter (Primary and Secondary): Medicaid [place] #(s):  [id number]    Lanier Clam, LPN

## 2018-02-11 NOTE — Progress Notes (Signed)
Yecenia Dalgleish M. Jemia Fata, MD  [address]  [place], [place] [address]  Phone:  [phone]  Fax:  [phone]     Consult    Dear No ref. provider found,    Thank you for referring Angela Bishop.  I had the pleasure seeing them in clinic for the following complaint.  I have attached a copy of the consult note.  If questions should arise please contact our office.  Chief Complaint   Patient presents with   ??? New Patient     Ref by Dr.Adkins       Patient ID  Name:  Angela Bishop  DOB:  08-31-1985  MRN:  [id number]  Age:  33 y.o.  PCP:  Sheliah Mends, NP    Subjective:     Encounter Date:  02/11/2018  Chief Complaint   Patient presents with   ??? New Patient     Ref by Dr.Adkins   .  History of Present Illness:     Angela Bishop is a 33 year old female who was referred to clinic with cc of diarrhea.  Hx of diarrhea since 2011- hx of abdominal surgeries chole, appy, large and small bowel resection, hx of ileostomy reversal.  Diarrhea is better on lomotil.  No improvement with pepto bismol and imodium.  Complains of abdominal pain in mid abdomen- pain is intermittent and moderate.    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 lamoTRIgine (LAMICTAL) 25 mg tablet Take 50 mg by mouth daily.     ??? diphenoxylate-atropine (LOMOTIL) 2.5-0.025 mg per tablet Take 2 Tabs by mouth four (4) times daily for 30 days. Max Daily Amount: 8 Tabs. 240 Tab 3   ??? omeprazole (PRILOSEC) 40 mg capsule Take 1 Cap by mouth daily. 30 Cap 3   ??? polyethylene glycol (MIRALAX) 17 gram/dose powder Per instruction for bowel prep 238 g 0   ??? tiZANidine (ZANAFLEX) 4 mg tablet Take 1 Tab by mouth nightly. Indications: muscle spasm 30 Tab 0   ??? traZODone (DESYREL) 300 mg  tablet Take 300 mg by mouth nightly.     ??? QUEtiapine (SEROQUEL) 100 mg tablet Take 100 mg by mouth daily.     ??? buprenorphine-naloxone 8-2 mg subl 2 Tabs by SubLINGual route daily.     ??? multivitamin (ONE A DAY) tablet Take 1 Tab by mouth daily. Indications: Treatment To Prevent Vitamin Deficiency 30 Tab 1   ??? fluticasone-Salmeterol (ADVAIR HFA) 45-21 mcg/actuation inhaler Take 2 Puffs by inhalation two (2) times a day. Indications: Controller Medication for Asthma 1 Inhaler 0   ??? haloperidol (HALDOL) 5 mg tablet Take 1 Tab by mouth two (2) times a day. Indications: Psychosis 30 Tab 1   ??? diphenoxylate-atropine (LOMOTIL) 2.5-0.025 mg per tablet Take 2 Tabs by mouth.       Allergies   Allergen Reactions   ??? Fentanyl Swelling   ??? Reglan [Metoclopramide] Other (comments)   ??? Toradol [Ketorolac] Swelling   ??? Ultram [Tramadol] Nausea and Vomiting   ??? Zofran [Ondansetron Hcl] Other (comments)     Patient Active Problem List   Diagnosis Code   ??? Suicidal ideations R45.851   ??? Suicidal ideation R45.851   ???  Bipolar disorder with psychotic features (HCC) F31.9     Past Medical History:   Diagnosis Date   ??? Anxiety    ??? Arthritis    ??? Asthma    ??? Back pain    ??? Bipolar 1 disorder (HCC)    ??? COPD (chronic obstructive pulmonary disease) (HCC)    ??? Depression    ??? Fibromyalgia    ??? Generalized headaches    ??? GERD (gastroesophageal reflux disease)    ??? Hepatitis C    ??? Kidney stones    ??? Neuropathy    ??? Osteoporosis    ??? Pancreatitis, chronic (HCC)    ??? RA (rheumatoid arthritis) (HCC)    ??? Short gut syndrome       Past Surgical History:   Procedure Laterality Date   ??? ABDOMEN SURGERY PROC UNLISTED     ??? HX APPENDECTOMY     ??? HX LAP CHOLECYSTECTOMY     ??? HX TONSILLECTOMY        Family History   Problem Relation Age of Onset   ??? Hypertension Mother    ??? Heart Disease Mother    ??? Diabetes Mother    ??? Cancer Father    ??? Asthma Brother    ??? Heart Disease Paternal Grandmother    ??? Heart Disease Paternal Grandfather       Social History      Socioeconomic History   ??? Marital status: SINGLE     Spouse name: Not on file   ??? Number of children: Not on file   ??? Years of education: Not on file   ??? Highest education level: Not on file   Tobacco Use   ??? Smoking status: Current Every Day Smoker     Packs/day: 1.00   ??? Smokeless tobacco: Never Used   Substance and Sexual Activity   ??? Alcohol use: Not Currently   ??? Drug use: Not Currently   ??? Sexual activity: Yes       Review of Systems:  A comprehensive review of systems was negative except for that written in the HPI.       Objective:     Vitals:    02/11/18 1500   BP: 120/80   Pulse: 92   Temp: 98.2 ??F (36.8 ??C)   SpO2: 98%   Weight: 65.3 kg (144 lb)   Height: 5\' 5"  (1.651 m)   PainSc:   0 - No pain       Physical Exam:    General:  Well defined, nourished, and groomed individual in no acute distress.    Neck: Supple, nontender, no bruits, no pain with resistance to active range of motion.    Heart: Regular rate and rhythm, no murmurs  Lungs:  Clear to auscultation bilaterally, no cough, no wheeze  Musculoskeletal:  Extremities revealed no edema and were free of deformity  Psych:  Good mood and bright affect   Abdomen:  soft, nontender, nondistended, no masses or organomegaly    Review of Additional Data: office notes, radiology reports, lab reports    Impression:     Encounter Diagnoses     ICD-10-CM ICD-9-CM   1. 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 CELIAC ANTIBODY PROFILE   ??? SED RATE, AUTOMATED   ??? C REACTIVE PROTEIN, QT   ??? diphenoxylate-atropine (LOMOTIL) 2.5-0.025 mg per tablet   ???  omeprazole (PRILOSEC) 40 mg capsule   ??? polyethylene glycol (MIRALAX) 17 gram/dose powder     EGD and colonoscopy  Trial of prilosec  Trial of lomotil  Labs    Follow-up and Dispositions    ?? Return in about 2 months (around 04/12/2018).       Time Spent:  30 minutes.  50% of face to face time today spent counseling patient regarding The  primary encounter diagnosis was Esophageal dysphagia. A diagnosis of Functional diarrhea was also pertinent to this visit. and coordinating of care.    Signed By:  Ileana Roup, MD     02/17/2018

## 2018-02-11 NOTE — Progress Notes (Signed)
[facility] CASE SCHEDULING FORM  Phone:  [phone] Fax:  [phone]  Faxes are considered tentatively scheduled until confirmed by the [facility] scheduling office    Surgeon Name: Dr. Diane Settles  Surgery Date & Time:  Wednesday March 13, 2018 @ 9:00AM  Surgery Location: Endo  Patient Name: Angela Bishop  Patient Date Of Birth: 07/14/1985  Patient Social Security: [id number]   Patient Phone Number: [phone] (home)   Gender:  female  Anesthesia Type: MAC  CPT Code(s):  45378. 45380. 45384. 45385. 43235. 43239. 43248. 43249  Patient Pre-op diagnosis: diarreha: k59.1 dysphagia: r13.10  Procedure: egd/colon  Insurance (Primary and Secondary): Medicaid [place]  Insurance Policy #(s):  [id number]    Delane Wessinger, LPN

## 2018-02-11 NOTE — Progress Notes (Signed)
Lyndall Windt M. Vernetta Dizdarevic, MD  [address], [address]  [place], [place] [address]  Phone:  [phone]  Fax:  [phone]     Consult    Dear No ref. provider found,    Thank you for referring Angela Bishop.  I had the pleasure seeing them in clinic for the following complaint.  I have attached a copy of the consult note.  If questions should arise please contact our office.  Chief Complaint   Patient presents with   ??? New Patient     Ref by Dr.Adkins       Patient ID  Name:  Angela Bishop  DOB:  [birth year]  MRN:  [id number]  Age:  33 y.o.  PCP:  Sheliah Mends, NP    Subjective:     Encounter Date:  02/11/2018  Chief Complaint   Patient presents with   ??? New Patient     Ref by Dr.Adkins   .  History of Present Illness:     Angela Bishop is a 33 year old female who was referred to clinic with cc of diarrhea.  Hx of diarrhea since 2011- hx of abdominal surgeries chole, appy, large and small bowel resection, hx of ileostomy reversal.  Diarrhea is better on lomotil.  No improvement with pepto bismol and imodium.  Complains of abdominal pain in mid abdomen- pain is intermittent and moderate.    Hx of redundant colon and psbo- multiple surgeries with small bowel resection    Complains of GERD- no aggravating or alleviating factors  Dysphagia to solids      Outpatient Medications Marked as Taking for the 02/11/18 encounter (Office Visit) with Ardene Remley, Orlin Hilding, MD   Medication Sig Dispense Refill   ??? lamoTRIgine (LAMICTAL) 25 mg tablet Take 50 mg by mouth daily.     ??? diphenoxylate-atropine (LOMOTIL) 2.5-0.025 mg per tablet Take 2 Tabs by mouth four (4) times daily for 30 days. Max Daily Amount: 8 Tabs. 240 Tab 3   ??? omeprazole (PRILOSEC) 40 mg capsule Take 1 Cap by mouth daily. 30 Cap 3   ??? polyethylene glycol (MIRALAX) 17 gram/dose powder Per instruction for bowel prep 238 g 0   ??? tiZANidine (ZANAFLEX) 4 mg tablet Take 1 Tab by mouth nightly. Indications: muscle spasm 30 Tab 0    ??? traZODone (DESYREL) 300 mg tablet Take 300 mg by mouth nightly.     ??? QUEtiapine (SEROQUEL) 100 mg tablet Take 100 mg by mouth daily.     ??? buprenorphine-naloxone 8-2 mg subl 2 Tabs by SubLINGual route daily.     ??? multivitamin (ONE A DAY) tablet Take 1 Tab by mouth daily. Indications: Treatment To Prevent Vitamin Deficiency 30 Tab 1   ??? fluticasone-Salmeterol (ADVAIR HFA) 45-21 mcg/actuation inhaler Take 2 Puffs by inhalation two (2) times a day. Indications: Controller Medication for Asthma 1 Inhaler 0   ??? haloperidol (HALDOL) 5 mg tablet Take 1 Tab by mouth two (2) times a day. Indications: Psychosis 30 Tab 1   ??? diphenoxylate-atropine (LOMOTIL) 2.5-0.025 mg per tablet Take 2 Tabs by mouth.       Allergies   Allergen Reactions   ??? Fentanyl Swelling   ??? Reglan [Metoclopramide] Other (comments)   ??? Toradol [Ketorolac] Swelling   ??? Ultram [Tramadol] Nausea and Vomiting   ??? Zofran [Ondansetron Hcl] Other (comments)     Patient Active Problem List   Diagnosis Code   ??? Suicidal ideations R45.851   ??? Suicidal ideation R45.851   ???  Bipolar disorder with psychotic features (HCC) F31.9     Past Medical History:   Diagnosis Date   ??? Anxiety    ??? Arthritis    ??? Asthma    ??? Back pain    ??? Bipolar 1 disorder (HCC)    ??? COPD (chronic obstructive pulmonary disease) (HCC)    ??? Depression    ??? Fibromyalgia    ??? Generalized headaches    ??? GERD (gastroesophageal reflux disease)    ??? Hepatitis C    ??? Kidney stones    ??? Neuropathy    ??? Osteoporosis    ??? Pancreatitis, chronic (HCC)    ??? RA (rheumatoid arthritis) (HCC)    ??? Short gut syndrome       Past Surgical History:   Procedure Laterality Date   ??? ABDOMEN SURGERY PROC UNLISTED     ??? HX APPENDECTOMY     ??? HX LAP CHOLECYSTECTOMY     ??? HX TONSILLECTOMY        Family History   Problem Relation Age of Onset   ??? Hypertension Mother    ??? Heart Disease Mother    ??? Diabetes Mother    ??? Cancer Father    ??? Asthma Brother    ??? Heart Disease Paternal Grandmother     ??? Heart Disease Paternal Grandfather       Social History     Socioeconomic History   ??? Marital status: SINGLE     Spouse name: Not on file   ??? Number of children: Not on file   ??? Years of education: Not on file   ??? Highest education level: Not on file   Tobacco Use   ??? Smoking status: Current Every Day Smoker     Packs/day: 1.00   ??? Smokeless tobacco: Never Used   Substance and Sexual Activity   ??? Alcohol use: Not Currently   ??? Drug use: Not Currently   ??? Sexual activity: Yes       Review of Systems:  A comprehensive review of systems was negative except for that written in the HPI.       Objective:     Vitals:    02/11/18 1500   BP: 120/80   Pulse: 92   Temp: 98.2 ??F (36.8 ??C)   SpO2: 98%   Weight: 65.3 kg (144 lb)   Height: 5\' 5"  (1.651 m)   PainSc:   0 - No pain       Physical Exam:    General:  Well defined, nourished, and groomed individual in no acute distress.    Neck: Supple, nontender, no bruits, no pain with resistance to active range of motion.    Heart: Regular rate and rhythm, no murmurs  Lungs:  Clear to auscultation bilaterally, no cough, no wheeze  Musculoskeletal:  Extremities revealed no edema and were free of deformity  Psych:  Good mood and bright affect   Abdomen:  soft, nontender, nondistended, no masses or organomegaly    Review of Additional Data: office notes, radiology reports, lab reports    Impression:     Encounter Diagnoses     ICD-10-CM ICD-9-CM   1. 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 CELIAC ANTIBODY PROFILE   ??? SED RATE, AUTOMATED   ??? C REACTIVE PROTEIN, QT   ??? diphenoxylate-atropine (LOMOTIL) 2.5-0.025 mg per tablet   ???  omeprazole (PRILOSEC) 40 mg capsule   ??? polyethylene glycol (MIRALAX) 17 gram/dose powder     EGD and colonoscopy  Trial of prilosec  Trial of lomotil  Labs    Follow-up and Dispositions    ?? Return in about 2 months (around 04/12/2018).       Time Spent:  30 minutes.   50% of face to face time today spent counseling patient regarding The primary encounter diagnosis was Esophageal dysphagia. A diagnosis of Functional diarrhea was also pertinent to this visit. and coordinating of care.    Signed By:  Ileana Roup, MD     02/17/2018

## 2018-02-11 NOTE — Patient Instructions (Signed)
[facility]-   Dr. Diane Settles       Dr. John  Brandenburg     Office working hours are Monday-Thursday 8am- 5:00pm and Friday from 8am-4:30pm.  The office is closed for lunch between the hours of 12-1.    Office phone number is [phone] and fax is [phone].    For non-emergent medical care and clinical advice during office hours:   1. Call office or you can   2.  Send message or request using MyChart    For non-emergent medical care and clinical advice after office hours:  1.  Call [phone], you will receive the after hours answering service and they can take care of your need.   2.  Send message or request using MyChart    Emergency care can be obtained at the [facility] ER, Urgent Care or calling 911.     Patient Satisfaction Survey  As a valued patient, you will be receiving a survey from [employer].  We encourage you to share your thoughts and opinions about the care you received today.  Thank you for choosing [facility].      Thank you for your participation,  [facility]   Dr. Diane Settles   Dr. John Brandenburg

## 2018-03-11 NOTE — Progress Notes (Signed)
 Surgeon Name: Dr. Diane Settles  Surgery Date & Time: March 28, 2018  10:30 AM  Surgery Location: Endo  Patient Name: Angela Bishop  Patient Date Of Birth: Oct 09, 1985  Patient Social Security: [id number]   Patient Phone Number: [phone] (home)   Gender:  female  Anesthesia Type: MAC  CPT Code(s):  45378. 45380. [id number]. [id number]. [phone]. [phone]. [phone]. [phone]  Patient Pre-op diagnosis: diarreha: k59.1 dysphagia: r13.10  Procedure: egd/colon  Community education officer (Primary and Secondary): Medicaid [place]  Insurance Policy #(s):  [id number]

## 2018-03-11 NOTE — Progress Notes (Signed)
Surgeon Name: Dr. Diane Settles  Surgery Date & Time: March 28, 2018  10:30 AM  Surgery Location: Endo  Patient Name: Angela Bishop  Patient Date Of Birth: 07/18/1985  Patient Social Security: [id number]   Patient Phone Number: [phone] (home)   Gender:  female  Anesthesia Type: MAC  CPT Code(s):  45378. 45380. 45384. 45385. 43235. 43239. 43248. 43249  Patient Pre-op diagnosis: diarreha: k59.1 dysphagia: r13.10  Procedure: egd/colon  Insurance (Primary and Secondary): Medicaid [place]  Insurance Policy #(s):  [id number]  ??

## 2018-03-26 ENCOUNTER — Inpatient Hospital Stay: Admit: 2018-03-26 | Payer: MEDICARE | Primary: Student in an Organized Health Care Education/Training Program

## 2018-03-26 DIAGNOSIS — Z01812 Encounter for preprocedural laboratory examination: Secondary | ICD-10-CM

## 2018-03-26 LAB — HCG QL SERUM
HCG, Ql.: NEGATIVE
HCG, Ql.: NEGATIVE

## 2018-03-26 LAB — GLUCOSE, RANDOM
Glucose: 92 mg/dL (ref 70–110)
Glucose: 92 mg/dL (ref 70–110)

## 2018-03-26 LAB — C-REACTIVE PROTEIN: CRP: 0.79 mg/dL — ABNORMAL HIGH (ref <0.32)

## 2018-03-26 LAB — HEMOGLOBIN AND HEMATOCRIT
Hematocrit: 43.8 % (ref 41–53)
Hemoglobin: 14.6 g/dL (ref 12.0–16.0)

## 2018-03-26 LAB — SEDIMENTATION RATE, AUTOMATED: Sed Rate: 5 mm/hr (ref 0–25)

## 2018-03-26 LAB — C REACTIVE PROTEIN, QT: C-Reactive protein: 0.79 mg/dL — ABNORMAL HIGH (ref <0.32)

## 2018-03-26 LAB — SED RATE, AUTOMATED: Sed rate, automated: 5 mm/hr (ref 0–25)

## 2018-03-26 LAB — HGB & HCT
HCT: 43.8 % (ref 41–53)
HGB: 14.6 g/dL (ref 12.0–16.0)

## 2018-03-26 NOTE — Interval H&P Note (Signed)
Name, date of birth, procedure verified with patient in PAT.

## 2018-03-26 NOTE — Progress Notes (Signed)
normal

## 2018-03-26 NOTE — Progress Notes (Signed)
No celiac disease

## 2018-03-26 NOTE — Progress Notes (Signed)
Patient aware

## 2018-03-26 NOTE — Other (Signed)
Name, date of birth, procedure verified with patient in PAT.

## 2018-03-27 NOTE — Progress Notes (Signed)
normal

## 2018-03-28 ENCOUNTER — Inpatient Hospital Stay: Payer: MEDICARE

## 2018-03-28 LAB — CELIAC ANTIBODY PROFILE
DEAMIDATED GLIADIN ABS,IGA, 161651: 9 units (ref 0–19)
DEAMIDATED GLIADIN ABS,IGG, 161692: 3 units (ref 0–19)
Deamidated Gliadin Ab, IgA: 9 units (ref 0–19)
Deamidated Gliadin Ab, IgG: 3 units (ref 0–19)
Immunoglobulin A, Qt.: 194 mg/dL (ref 87–352)
Immunoglobulin A, Qt.: 194 mg/dL (ref 87–352)
T-TRANSGLUTAMINASE (TTG) IGG,164989: <2 U/mL (ref 0–5)
T-TRANSGLUTAMINASE, IGA, 164643: <2 U/mL (ref 0–3)
t-Transglutaminase, IgA: <2 U/mL (ref 0–3)
t-Transglutaminase, IgG: <2 U/mL (ref 0–5)

## 2018-03-28 MED ORDER — SODIUM CHLORIDE 0.9 % IJ SYRG
Freq: Three times a day (TID) | INTRAMUSCULAR | Status: DC
Start: 2018-03-28 — End: 2018-03-28

## 2018-03-28 MED ORDER — PROPOFOL 10 MG/ML IV EMUL
10 mg/mL | INTRAVENOUS | Status: AC
Start: 2018-03-28 — End: ?

## 2018-03-28 MED ORDER — SODIUM CHLORIDE 0.9 % IJ SYRG
INTRAMUSCULAR | Status: DC | PRN
Start: 2018-03-28 — End: 2018-03-28

## 2018-03-28 MED ORDER — PROPOFOL 10 MG/ML IV EMUL
10 mg/mL | INTRAVENOUS | Status: DC | PRN
Start: 2018-03-28 — End: 2018-03-28
  Administered 2018-03-28 (×9): via INTRAVENOUS

## 2018-03-28 MED ORDER — LIDOCAINE (PF) 20 MG/ML (2 %) IV SYRINGE
100 mg/5 mL (2 %) | INTRAVENOUS | Status: AC
Start: 2018-03-28 — End: ?

## 2018-03-28 MED ORDER — MIDAZOLAM (PF) 1 MG/ML INJECTION SOLUTION
1 mg/mL | Freq: Once | INTRAMUSCULAR | Status: AC
Start: 2018-03-28 — End: 2018-03-28
  Administered 2018-03-28: 15:00:00 via INTRAVENOUS

## 2018-03-28 MED ORDER — LIDOCAINE (PF) 20 MG/ML (2 %) IV SYRINGE
100 mg/5 mL (2 %) | INTRAVENOUS | Status: DC | PRN
Start: 2018-03-28 — End: 2018-03-28
  Administered 2018-03-28: 16:00:00 via INTRAVENOUS

## 2018-03-28 MED ORDER — LACTATED RINGERS IV
INTRAVENOUS | Status: DC
Start: 2018-03-28 — End: 2018-03-28
  Administered 2018-03-28: 15:00:00 via INTRAVENOUS

## 2018-03-28 MED FILL — LIDOCAINE (PF) 20 MG/ML (2 %) IV SYRINGE: 100 mg/5 mL (2 %) | INTRAVENOUS | Qty: 5

## 2018-03-28 MED FILL — DIPRIVAN 10 MG/ML INTRAVENOUS EMULSION: 10 mg/mL | INTRAVENOUS | Qty: 20

## 2018-03-28 MED FILL — MIDAZOLAM (PF) 1 MG/ML INJECTION SOLUTION: 1 mg/mL | INTRAMUSCULAR | Qty: 2

## 2018-03-28 MED FILL — NORMAL SALINE FLUSH 0.9 % INJECTION SYRINGE: INTRAMUSCULAR | Qty: 40

## 2018-03-28 NOTE — Procedures (Signed)
Angela Battey M. Mariadelaluz Guggenheim, MD  [address], [place] [address]  Phone:  [phone]  Fax:  [phone]  Colonoscopy Procedure Note    Indications: Diarrhea    Anesthesia/Sedation: MAC anesthesia    Pre-Procedure Exam:  Airway: clear   Heart: normal S1and S2    Lungs: clear bilateral  Abdomen: soft, nontender, bowel sounds present and normal in all quadrants   Mental Status: awake, alert, and oriented to person, place, and time      Procedure in Detail:  Informed consent was obtained for the procedure, including sedation.  Risks of perforation, hemorrhage, adverse drug reaction, and aspiration were discussed. The patient was placed in the left lateral decubitus position.  Based on the pre-procedure assessment, including review of the patient's medical history, medications, allergies, and review of systems, she had been deemed to be an appropriate candidate for moderate sedation; she was therefore sedated with the medications listed above.   The patient was monitored continuously with ECG tracing, pulse oximetry, blood pressure monitoring, and direct observations.      A rectal examination was performed. The [id number] was inserted into the rectum and advanced under direct vision to the terminal ileum.  The quality of the colonic preparation was good.  A careful inspection was made as the colonoscope was withdrawn, including a retroflexed view of the rectum; findings and interventions are described below.  Appropriate photodocumentation was obtained.    Findings:   1. Normal perianal inspection and digital rectal examination  2. Normal colon mucosa- biopsied  3. Evidence of end to side ileocolonic anastomosis  4. Normal ileum- biopsied    Specimens: Specimens were collected and sent to pathology.       EBL: None    Complications: None; patient tolerated the procedure well.    Attending Attestation: I performed the procedure.    Recommendations:   - Await pathology.    Signed By: Melvyn Novas, MD                         March 28, 2018

## 2018-03-28 NOTE — Procedures (Signed)
Thaer Miyoshi M. Aleea Hendry, MD  [address], [place] [address]  Phone:  [phone]  Fax:  [phone]  Endoscopic Gastroduodenoscopy Procedure Note    Angela Bishop  May 19, 1985  [id number]    Indication: Diarrhea    Operator: Melvyn Novas, MD    Referring Provider:  Alma Downs, Not On File      Anesthesia/Sedation:  MAC anesthesia    Airway assessment: No airway problems anticipated    Pre-Procedural Exam:      Airway: clear, no airway problems anticipated  Heart: RRR, without gallops or rubs  Lungs: clear bilaterally without wheezes, crackles, or rhonchi  Abdomen: soft, nontender, nondistended, bowel sounds present  Mental Status: awake, alert and oriented to person, place and time       Procedure Details     After infomed consent was obtained for the procedure, with all risks and benefits of procedure explained the patient was taken to the endoscopy suite and placed in the left lateral decubitus position.  Following sequential administration of sedation as per above, the endoscope was inserted into the mouth and advanced under direct vision to third portion of the duodenum.  A careful inspection was made as the gastroscope was withdrawn, including a retroflexed view of the proximal stomach; findings and interventions are described below.      Findings:   Esophagus:Small hiatal hernia  Stomach: normal   Duodenum/jejunum: normal    Therapies:  biopsy of stomach body, antrum    Specimens: gastric           Complications:   None; patient tolerated the procedure well.    EBL:  None.           Impression:    hiatal hernia       Recommendations:  -Await pathology.    Melvyn Novas, MD  03/28/2018  10:44 AM

## 2018-03-28 NOTE — H&P (Signed)
History and Physical        Patient: Angela Bishop               Sex: female             MRN: [id number]     Date of Birth:  1985-10-08      Age:  33 y.o.            No chief complaint on file.  Marland Kitchen  HPI:     Angela Bishop is a 33 y.o. female who presents with cc of gerd and hx of colon polps    Past Medical History:   Diagnosis Date   ??? Anxiety    ??? Arthritis    ??? Asthma    ??? Back pain    ??? Bipolar 1 disorder ([place])    ??? COPD (chronic obstructive pulmonary disease) ([place])    ??? Depression    ??? Fibromyalgia    ??? Generalized headaches    ??? GERD (gastroesophageal reflux disease)    ??? Hepatitis C    ??? Kidney stones    ??? Neuropathy    ??? Osteoporosis    ??? Pancreatitis, chronic ([place])    ??? Short gut syndrome        Past Surgical History:   Procedure Laterality Date   ??? ABDOMEN SURGERY PROC UNLISTED     ??? HX APPENDECTOMY     ??? HX GI      colectomy, ileostomy reversal x2   ??? HX LAP CHOLECYSTECTOMY     ??? HX OTHER SURGICAL      EGD, COLONOSCOPY   ??? HX TONSILLECTOMY         Family History   Problem Relation Age of Onset   ??? Hypertension Mother    ??? Heart Disease Mother    ??? Diabetes Mother    ??? Cancer Father    ??? Asthma Brother    ??? Heart Disease Paternal Grandmother    ??? Heart Disease Paternal Grandfather        Social History     Socioeconomic History   ??? Marital status: SINGLE     Spouse name: Not on file   ??? Number of children: Not on file   ??? Years of education: Not on file   ??? Highest education level: Not on file   Tobacco Use   ??? Smoking status: Current Every Day Smoker     Packs/day: 0.50     Years: 15.00     Pack years: 7.50   ??? Smokeless tobacco: Never Used   Substance and Sexual Activity   ??? Alcohol use: Not Currently   ??? Drug use: Not Currently     Comment: hx of heroin use 3 years ago   ??? Sexual activity: Yes       Prior to Admission medications    Medication Sig Start Date End Date Taking? Authorizing Provider   lamoTRIgine (LAMICTAL) 25 mg tablet Take 50 mg by mouth daily.   Yes Provider, Historical    omeprazole (PRILOSEC) 40 mg capsule Take 1 Cap by mouth daily. 02/11/18  Yes Anistyn Graddy, Georgia Duff, MD   polyethylene glycol [facility]) 17 gram/dose powder Per instruction for bowel prep 02/11/18  Yes Minnetta Sandora, Georgia Duff, MD   tiZANidine (ZANAFLEX) 4 mg tablet take 1 tablet by mouth once daily at bedtime if needed for muscle spasm  Patient taking differently: Take 8 mg by mouth three (3) times daily as needed. 11/21/17  Yes Wynetta Emery,  Ok Anis, NP   traZODone (DESYREL) 300 mg tablet Take 300 mg by mouth nightly.   Yes Provider, Historical   QUEtiapine (SEROQUEL) 100 mg tablet Take 200 mg by mouth daily.   Yes Provider, Historical   buprenorphine-naloxone 8-2 mg subl 2 Tabs by SubLINGual route daily.   Yes Provider, Historical   multivitamin (ONE A DAY) tablet Take 1 Tab by mouth daily. Indications: Treatment To Prevent Vitamin Deficiency 08/21/17  Yes Oloworaran, Genia Del, MD   fluticasone-Salmeterol (ADVAIR HFA) 45-21 mcg/actuation inhaler Take 2 Puffs by inhalation two (2) times a day. Indications: Controller Medication for Asthma 08/20/17  Yes Oloworaran, Ayobola A, MD   haloperidol (HALDOL) 5 mg tablet Take 1 Tab by mouth two (2) times a day. Indications: Psychosis  Patient taking differently: Take 5 mg by mouth as needed. Indications: Psychosis 08/20/17  Yes Oloworaran, Genia Del, MD   diphenoxylate-atropine (LOMOTIL) 2.5-0.025 mg per tablet Take 2 Tabs by mouth four (4) times daily. Indications: PTA medication   Yes Provider, Historical       Allergies   Allergen Reactions   ??? Fentanyl Swelling   ??? Reglan [Metoclopramide] Other (comments)     "twitching" hands & legs   ??? Toradol [Ketorolac] Swelling   ??? Ultram [Tramadol] Nausea and Vomiting   ??? Vancomycin Swelling     "Feels like fire ants from head to toe"   ??? Zofran [Ondansetron Hcl] Rash       Review of Systems  A comprehensive review of systems was negative except for that written in the History of Present Illness.      Physical Exam:      Visit Vitals  BP 129/56    Pulse (!) 103   Temp 98.2 ??F (36.8 ??C)   Resp 18   SpO2 99%   Breastfeeding No       Physical Exam:  General:  alert, cooperative, no distress, appears stated age  Lungs:  clear to auscultation bilaterally  Heart:  regular rate and rhythm, S1, S2 normal, no murmur, click, rub or gallop  Abdomen:  soft, non-tender. Bowel sounds normal. No masses,  no organomegaly    Assessment/Plan     GERD  Hx of colon polyps    EGD and colonoscopy with mac

## 2018-03-28 NOTE — Anesthesia Pre-Procedure Evaluation (Signed)
Relevant Problems   No relevant active problems       Anesthetic History   No history of anesthetic complications            Review of Systems / Medical History  Patient summary reviewed, nursing notes reviewed and pertinent labs reviewed    Pulmonary    COPD: moderate      Smoker  Asthma : well controlled       Neuro/Psych         Psychiatric history     Cardiovascular  Within defined limits                Exercise tolerance: >4 METS     GI/Hepatic/Renal     GERD: poorly controlled  Hepatitis: type C    Liver disease     Endo/Other        Arthritis     Other Findings   Comments: Hx of heroin abuse            Physical Exam    Airway  Mallampati: II  TM Distance: 4 - 6 cm  Neck ROM: normal range of motion   Mouth opening: Normal     Cardiovascular  Regular rate and rhythm,  S1 and S2 normal,  no murmur, click, rub, or gallop  Rhythm: regular  Rate: normal         Dental    Dentition: Edentulous     Pulmonary  Breath sounds clear to auscultation               Abdominal  GI exam deferred       Other Findings            Anesthetic Plan    ASA: 3  Anesthesia type: MAC          Induction: Intravenous  Anesthetic plan and risks discussed with: Patient

## 2018-03-28 NOTE — Anesthesia Post-Procedure Evaluation (Signed)
Procedure(s):  ESOPHAGOGASTRODUODENOSCOPY (EGD)    WITH RADIAL JAW BX  COLONOSCOPY.    MAC    Anesthesia Post Evaluation      Multimodal analgesia: multimodal analgesia used between 6 hours prior to anesthesia start to PACU discharge  Patient location during evaluation: PACU  Patient participation: complete - patient participated  Level of consciousness: awake  Pain management: adequate  Airway patency: patent  Anesthetic complications: no  Cardiovascular status: acceptable  Respiratory status: acceptable  Hydration status: acceptable        Vitals Value Taken Time   BP 119/64 03/28/2018 11:20 AM   Temp     Pulse 86 03/28/2018 11:23 AM   Resp 18 03/28/2018 11:23 AM   SpO2 100 % 03/28/2018 11:23 AM   Vitals shown include unvalidated device data.

## 2018-03-28 NOTE — H&P (Signed)
History and Physical        Patient: Angela Bishop               Sex: female             MRN: [id number]     Date of Birth:  07/20/1985      Age:  33 y.o.            No chief complaint on file.  .  HPI:     Angela Bishop is a 33 y.o. female who presents with cc of gerd and hx of colon polps    Past Medical History:   Diagnosis Date   ??? Anxiety    ??? Arthritis    ??? Asthma    ??? Back pain    ??? Bipolar 1 disorder (HCC)    ??? COPD (chronic obstructive pulmonary disease) (HCC)    ??? Depression    ??? Fibromyalgia    ??? Generalized headaches    ??? GERD (gastroesophageal reflux disease)    ??? Hepatitis C    ??? Kidney stones    ??? Neuropathy    ??? Osteoporosis    ??? Pancreatitis, chronic (HCC)    ??? Short gut syndrome        Past Surgical History:   Procedure Laterality Date   ??? ABDOMEN SURGERY PROC UNLISTED     ??? HX APPENDECTOMY     ??? HX GI      colectomy, ileostomy reversal x2   ??? HX LAP CHOLECYSTECTOMY     ??? HX OTHER SURGICAL      EGD, COLONOSCOPY   ??? HX TONSILLECTOMY         Family History   Problem Relation Age of Onset   ??? Hypertension Mother    ??? Heart Disease Mother    ??? Diabetes Mother    ??? Cancer Father    ??? Asthma Brother    ??? Heart Disease Paternal Grandmother    ??? Heart Disease Paternal Grandfather        Social History     Socioeconomic History   ??? Marital status: SINGLE     Spouse name: Not on file   ??? Number of children: Not on file   ??? Years of education: Not on file   ??? Highest education level: Not on file   Tobacco Use   ??? Smoking status: Current Every Day Smoker     Packs/day: 0.50     Years: 15.00     Pack years: 7.50   ??? Smokeless tobacco: Never Used   Substance and Sexual Activity   ??? Alcohol use: Not Currently   ??? Drug use: Not Currently     Comment: hx of heroin use 3 years ago   ??? Sexual activity: Yes       Prior to Admission medications    Medication Sig Start Date End Date Taking? Authorizing Provider   lamoTRIgine (LAMICTAL) 25 mg tablet Take 50 mg by mouth daily.   Yes Provider, Historical    omeprazole (PRILOSEC) 40 mg capsule Take 1 Cap by mouth daily. 02/11/18  Yes Alexcia Schools M, MD   polyethylene glycol (MIRALAX) 17 gram/dose powder Per instruction for bowel prep 02/11/18  Yes Ahna Konkle M, MD   tiZANidine (ZANAFLEX) 4 mg tablet take 1 tablet by mouth once daily at bedtime if needed for muscle spasm  Patient taking differently: Take 8 mg by mouth three (3) times daily as needed. 11/21/17  Yes Johnson,   Kristie L, NP   traZODone (DESYREL) 300 mg tablet Take 300 mg by mouth nightly.   Yes Provider, Historical   QUEtiapine (SEROQUEL) 100 mg tablet Take 200 mg by mouth daily.   Yes Provider, Historical   buprenorphine-naloxone 8-2 mg subl 2 Tabs by SubLINGual route daily.   Yes Provider, Historical   multivitamin (ONE A DAY) tablet Take 1 Tab by mouth daily. Indications: Treatment To Prevent Vitamin Deficiency 08/21/17  Yes Oloworaran, Ayobola A, MD   fluticasone-Salmeterol (ADVAIR HFA) 45-21 mcg/actuation inhaler Take 2 Puffs by inhalation two (2) times a day. Indications: Controller Medication for Asthma 08/20/17  Yes Oloworaran, Ayobola A, MD   haloperidol (HALDOL) 5 mg tablet Take 1 Tab by mouth two (2) times a day. Indications: Psychosis  Patient taking differently: Take 5 mg by mouth as needed. Indications: Psychosis 08/20/17  Yes Oloworaran, Ayobola A, MD   diphenoxylate-atropine (LOMOTIL) 2.5-0.025 mg per tablet Take 2 Tabs by mouth four (4) times daily. Indications: PTA medication   Yes Provider, Historical       Allergies   Allergen Reactions   ??? Fentanyl Swelling   ??? Reglan [Metoclopramide] Other (comments)     "twitching" hands & legs   ??? Toradol [Ketorolac] Swelling   ??? Ultram [Tramadol] Nausea and Vomiting   ??? Vancomycin Swelling     "Feels like fire ants from head to toe"   ??? Zofran [Ondansetron Hcl] Rash       Review of Systems  A comprehensive review of systems was negative except for that written in the History of Present Illness.      Physical Exam:      Visit Vitals  BP 129/56    Pulse (!) 103   Temp 98.2 ??F (36.8 ??C)   Resp 18   SpO2 99%   Breastfeeding No       Physical Exam:  General:  alert, cooperative, no distress, appears stated age  Lungs:  clear to auscultation bilaterally  Heart:  regular rate and rhythm, S1, S2 normal, no murmur, click, rub or gallop  Abdomen:  soft, non-tender. Bowel sounds normal. No masses,  no organomegaly    Assessment/Plan     GERD  Hx of colon polyps    EGD and colonoscopy with mac

## 2018-03-28 NOTE — Procedures (Signed)
[name] M. [name], MD  [address], [address]  [place] [address]  Phone:  [phone]  Fax:  [phone]  Endoscopic Gastroduodenoscopy Procedure Note    Angela Bishop  06/21/1985  [id number]    Indication: Diarrhea    Operator: Lasalle Abee M Adair Lauderback, MD    Referring Provider:  Bshsi, Not On File      Anesthesia/Sedation:  MAC anesthesia    Airway assessment: No airway problems anticipated    Pre-Procedural Exam:      Airway: clear, no airway problems anticipated  Heart: RRR, without gallops or rubs  Lungs: clear bilaterally without wheezes, crackles, or rhonchi  Abdomen: soft, nontender, nondistended, bowel sounds present  Mental Status: awake, alert and oriented to person, place and time       Procedure Details     After infomed consent was obtained for the procedure, with all risks and benefits of procedure explained the patient was taken to the endoscopy suite and placed in the left lateral decubitus position.  Following sequential administration of sedation as per above, the endoscope was inserted into the mouth and advanced under direct vision to third portion of the duodenum.  A careful inspection was made as the gastroscope was withdrawn, including a retroflexed view of the proximal stomach; findings and interventions are described below.      Findings:   Esophagus:Small hiatal hernia  Stomach: normal   Duodenum/jejunum: normal    Therapies:  biopsy of stomach body, antrum    Specimens: gastric           Complications:   None; patient tolerated the procedure well.    EBL:  None.           Impression:    hiatal hernia       Recommendations:  -Await pathology.    [name] M Jilliann Subramanian, MD  03/28/2018  10:44 AM

## 2018-03-28 NOTE — Progress Notes (Signed)
Patient aware

## 2018-03-28 NOTE — Procedures (Signed)
Angela Bishop M. Zakery Normington, MD  [address], [address]  [place] [address]  Phone:  [phone]  Fax:  [phone]  Colonoscopy Procedure Note    Indications: Diarrhea    Anesthesia/Sedation: MAC anesthesia    Pre-Procedure Exam:  Airway: clear   Heart: normal S1and S2    Lungs: clear bilateral  Abdomen: soft, nontender, bowel sounds present and normal in all quadrants   Mental Status: awake, alert, and oriented to person, place, and time      Procedure in Detail:  Informed consent was obtained for the procedure, including sedation.  Risks of perforation, hemorrhage, adverse drug reaction, and aspiration were discussed. The patient was placed in the left lateral decubitus position.  Based on the pre-procedure assessment, including review of the patient's medical history, medications, allergies, and review of systems, she had been deemed to be an appropriate candidate for moderate sedation; she was therefore sedated with the medications listed above.   The patient was monitored continuously with ECG tracing, pulse oximetry, blood pressure monitoring, and direct observations.      A rectal examination was performed. The CFHQ190L was inserted into the rectum and advanced under direct vision to the terminal ileum.  The quality of the colonic preparation was good.  A careful inspection was made as the colonoscope was withdrawn, including a retroflexed view of the rectum; findings and interventions are described below.  Appropriate photodocumentation was obtained.    Findings:   1. Normal perianal inspection and digital rectal examination  2. Normal colon mucosa- biopsied  3. Evidence of end to side ileocolonic anastomosis  4. Normal ileum- biopsied    Specimens: Specimens were collected and sent to pathology.       EBL: None    Complications: None; patient tolerated the procedure well.    Attending Attestation: I performed the procedure.    Recommendations:   - Await pathology.    Signed By: Nyzir Dubois M Jaymie Misch, MD                         March 28, 2018

## 2018-03-28 NOTE — Anesthesia Pre-Procedure Evaluation (Signed)
Relevant Problems   No relevant active problems       Anesthetic History   No history of anesthetic complications            Review of Systems / Medical History  Patient summary reviewed, nursing notes reviewed and pertinent labs reviewed    Pulmonary    COPD: moderate      Smoker  Asthma : well controlled       Neuro/Psych         Psychiatric history     Cardiovascular  Within defined limits                Exercise tolerance: >4 METS     GI/Hepatic/Renal     GERD: poorly controlled  Hepatitis: type C    Liver disease     Endo/Other        Arthritis     Other Findings   Comments: Hx of heroin abuse            Physical Exam    Airway  Mallampati: II  TM Distance: 4 - 6 cm  Neck ROM: normal range of motion   Mouth opening: Normal     Cardiovascular  Regular rate and rhythm,  S1 and S2 normal,  no murmur, click, rub, or gallop  Rhythm: regular  Rate: normal         Dental    Dentition: Edentulous     Pulmonary  Breath sounds clear to auscultation               Abdominal  GI exam deferred       Other Findings            Anesthetic Plan    ASA: 3  Anesthesia type: MAC          Induction: Intravenous  Anesthetic plan and risks discussed with: Patient

## 2018-03-28 NOTE — Anesthesia Post-Procedure Evaluation (Signed)
Procedure(s):  ESOPHAGOGASTRODUODENOSCOPY (EGD)    WITH RADIAL JAW BX  COLONOSCOPY.    MAC    Anesthesia Post Evaluation      Multimodal analgesia: multimodal analgesia used between 6 hours prior to anesthesia start to PACU discharge  Patient location during evaluation: PACU  Patient participation: complete - patient participated  Level of consciousness: awake  Pain management: adequate  Airway patency: patent  Anesthetic complications: no  Cardiovascular status: acceptable  Respiratory status: acceptable  Hydration status: acceptable        Vitals Value Taken Time   BP 119/64 03/28/2018 11:20 AM   Temp     Pulse 86 03/28/2018 11:23 AM   Resp 18 03/28/2018 11:23 AM   SpO2 100 % 03/28/2018 11:23 AM   Vitals shown include unvalidated device data.

## 2018-03-29 NOTE — Progress Notes (Signed)
No celiac disease

## 2018-04-11 ENCOUNTER — Encounter: Attending: Internal Medicine | Primary: Student in an Organized Health Care Education/Training Program

## 2018-04-25 ENCOUNTER — Encounter: Attending: Internal Medicine | Primary: Student in an Organized Health Care Education/Training Program

## 2018-10-16 ENCOUNTER — Ambulatory Visit (RURAL_HEALTH_CENTER): Payer: Self-pay | Admitting: Nurse Practitioner

## 2018-10-17 ENCOUNTER — Ambulatory Visit: Payer: Medicare Other | Attending: Nurse Practitioner | Admitting: Nurse Practitioner

## 2018-10-17 ENCOUNTER — Emergency Department: Payer: Medicare Other

## 2018-10-17 ENCOUNTER — Encounter (RURAL_HEALTH_CENTER): Payer: Self-pay | Admitting: Nurse Practitioner

## 2018-10-17 ENCOUNTER — Emergency Department
Admission: EM | Admit: 2018-10-17 | Discharge: 2018-10-17 | Disposition: A | Discharge status: Home / Self Care | Payer: Medicare Other | Attending: Sports Medicine" | Admitting: Sports Medicine"

## 2018-10-17 VITALS — Resp 16 | Wt 148.0 lb

## 2018-10-17 DIAGNOSIS — F17210 Nicotine dependence, cigarettes, uncomplicated: Secondary | ICD-10-CM

## 2018-10-17 DIAGNOSIS — Z131 Encounter for screening for diabetes mellitus: Secondary | ICD-10-CM

## 2018-10-17 DIAGNOSIS — M7541 Impingement syndrome of right shoulder: Secondary | ICD-10-CM

## 2018-10-17 DIAGNOSIS — M797 Fibromyalgia: Secondary | ICD-10-CM

## 2018-10-17 DIAGNOSIS — F419 Anxiety disorder, unspecified: Secondary | ICD-10-CM

## 2018-10-17 DIAGNOSIS — S40011A Contusion of right shoulder, initial encounter: Secondary | ICD-10-CM | POA: Insufficient documentation

## 2018-10-17 DIAGNOSIS — Z13220 Encounter for screening for lipoid disorders: Secondary | ICD-10-CM

## 2018-10-17 DIAGNOSIS — E282 Polycystic ovarian syndrome: Secondary | ICD-10-CM | POA: Insufficient documentation

## 2018-10-17 DIAGNOSIS — B977 Papillomavirus as the cause of diseases classified elsewhere: Secondary | ICD-10-CM | POA: Insufficient documentation

## 2018-10-17 DIAGNOSIS — R87810 Cervical high risk human papillomavirus (HPV) DNA test positive: Secondary | ICD-10-CM

## 2018-10-17 DIAGNOSIS — J449 Chronic obstructive pulmonary disease, unspecified: Secondary | ICD-10-CM

## 2018-10-17 DIAGNOSIS — F5101 Primary insomnia: Secondary | ICD-10-CM

## 2018-10-17 DIAGNOSIS — J410 Simple chronic bronchitis: Secondary | ICD-10-CM

## 2018-10-17 DIAGNOSIS — F3160 Bipolar disorder, current episode mixed, unspecified: Secondary | ICD-10-CM

## 2018-10-17 LAB — URINE HCG QUALITATIVE: Urine HCG Qualitative: NEGATIVE

## 2018-10-17 MED ORDER — CHANTIX STARTING MONTH PAK 0.5 MG X 11 & 1 MG X 42 PO TABS
ORAL_TABLET | ORAL | 0 refills | Status: DC
Start: 2018-10-17 — End: 2018-11-15

## 2018-10-17 MED ORDER — CLONAZEPAM 0.5 MG PO TABS
0.5000 mg | ORAL_TABLET | Freq: Two times a day (BID) | ORAL | 1 refills | Status: DC
Start: 2018-10-17 — End: 2019-03-11

## 2018-10-17 MED ORDER — TIZANIDINE HCL 4 MG PO TABS
8.0000 mg | ORAL_TABLET | Freq: Three times a day (TID) | ORAL | 1 refills | Status: DC | PRN
Start: 2018-10-17 — End: 2018-11-15

## 2018-10-17 MED ORDER — ALBUTEROL SULFATE HFA 108 (90 BASE) MCG/ACT IN AERS
2.0000 | INHALATION_SPRAY | RESPIRATORY_TRACT | 2 refills | Status: DC | PRN
Start: 2018-10-17 — End: 2018-12-13

## 2018-10-17 MED ORDER — FLUTICASONE-SALMETEROL 230-21 MCG/ACT IN AERO
2.0000 | INHALATION_SPRAY | Freq: Two times a day (BID) | RESPIRATORY_TRACT | 2 refills | Status: DC
Start: 2018-10-17 — End: 2018-12-13

## 2018-10-17 MED ORDER — QUETIAPINE FUMARATE 400 MG PO TABS
400.0000 mg | ORAL_TABLET | Freq: Every evening | ORAL | 0 refills | Status: DC
Start: 2018-10-17 — End: 2018-12-03

## 2018-10-17 MED ORDER — PREGABALIN 75 MG PO CAPS
75.0000 mg | ORAL_CAPSULE | Freq: Every day | ORAL | 0 refills | Status: DC
Start: 2018-10-17 — End: 2018-11-15

## 2018-10-17 MED ORDER — TRAZODONE HCL 150 MG PO TABS
150.0000 mg | ORAL_TABLET | Freq: Every evening | ORAL | 0 refills | Status: DC
Start: 2018-10-17 — End: 2018-11-15

## 2018-10-17 MED ORDER — IBUPROFEN 600 MG PO TABS
600.0000 mg | ORAL_TABLET | Freq: Four times a day (QID) | ORAL | 0 refills | Status: DC | PRN
Start: 2018-10-17 — End: 2018-12-03

## 2018-10-17 MED ORDER — VARENICLINE TARTRATE 1 MG PO TABS
1.0000 mg | ORAL_TABLET | Freq: Two times a day (BID) | ORAL | 5 refills | Status: DC
Start: 2018-10-17 — End: 2018-11-15

## 2018-10-17 NOTE — ED Notes (Signed)
Bed: EDA10-A  Expected date:   Expected time:   Means of arrival: Ambulance  Comments:
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
## 2018-10-22 ENCOUNTER — Encounter (RURAL_HEALTH_CENTER): Payer: Self-pay | Admitting: Nurse Practitioner

## 2018-10-22 ENCOUNTER — Ambulatory Visit: Payer: Medicare Other | Attending: Family | Admitting: Family

## 2018-10-22 ENCOUNTER — Encounter (INDEPENDENT_AMBULATORY_CARE_PROVIDER_SITE_OTHER): Payer: Self-pay | Admitting: Family

## 2018-10-22 ENCOUNTER — Ambulatory Visit: Payer: Medicare Other | Attending: Nurse Practitioner | Admitting: Nurse Practitioner

## 2018-10-22 VITALS — BP 102/68 | HR 104 | Ht 65.0 in | Wt 141.0 lb

## 2018-10-22 DIAGNOSIS — K912 Postsurgical malabsorption, not elsewhere classified: Secondary | ICD-10-CM

## 2018-10-22 DIAGNOSIS — Z79899 Other long term (current) drug therapy: Secondary | ICD-10-CM | POA: Insufficient documentation

## 2018-10-22 DIAGNOSIS — R110 Nausea: Secondary | ICD-10-CM

## 2018-10-22 DIAGNOSIS — F3181 Bipolar II disorder: Secondary | ICD-10-CM | POA: Insufficient documentation

## 2018-10-22 DIAGNOSIS — F99 Mental disorder, not otherwise specified: Secondary | ICD-10-CM | POA: Insufficient documentation

## 2018-10-22 DIAGNOSIS — F411 Generalized anxiety disorder: Secondary | ICD-10-CM | POA: Insufficient documentation

## 2018-10-22 DIAGNOSIS — M791 Myalgia, unspecified site: Secondary | ICD-10-CM

## 2018-10-22 MED ORDER — HYDROXYZINE HCL 25 MG PO TABS
ORAL_TABLET | ORAL | 1 refills | Status: DC
Start: 2018-10-22 — End: 2018-12-03

## 2018-10-22 MED ORDER — ONDANSETRON HCL 4 MG PO TABS
4.0000 mg | ORAL_TABLET | Freq: Every day | ORAL | 1 refills | Status: DC | PRN
Start: 2018-10-22 — End: 2018-11-15

## 2018-10-22 MED ORDER — TRAZODONE HCL 150 MG PO TABS
ORAL_TABLET | ORAL | 1 refills | Status: DC
Start: 2018-10-22 — End: 2018-12-13

## 2018-10-22 MED ORDER — LATUDA 40 MG PO TABS
40.0000 mg | ORAL_TABLET | Freq: Every day | ORAL | 1 refills | Status: DC
Start: 2018-10-22 — End: 2018-12-03

## 2018-10-22 MED ORDER — DIPHENOXYLATE-ATROPINE 2.5-0.025 MG PO TABS
1.0000 | ORAL_TABLET | Freq: Four times a day (QID) | ORAL | 2 refills | Status: DC | PRN
Start: 2018-10-22 — End: 2018-11-15
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
## 2018-10-25 ENCOUNTER — Telehealth (RURAL_HEALTH_CENTER): Payer: Self-pay

## 2018-10-25 NOTE — Telephone Encounter (Signed)
Patient called stated she has thrush and is requesting Nystatin for her mouth along with lidocaine. Would you recommend that she has an appointment?    Thank you

## 2018-10-25 NOTE — Telephone Encounter (Signed)
Yes, patient will need an appointment.     Kathi Simpers, FNP-C

## 2018-10-25 NOTE — Telephone Encounter (Signed)
LMTCB CNR

## 2018-10-29 NOTE — Telephone Encounter (Signed)
LMTCB CNR

## 2018-10-30 ENCOUNTER — Ambulatory Visit (RURAL_HEALTH_CENTER): Payer: Medicare Other | Admitting: Obstetrics & Gynecology

## 2018-10-30 ENCOUNTER — Ambulatory Visit (INDEPENDENT_AMBULATORY_CARE_PROVIDER_SITE_OTHER): Payer: Medicaid Other | Admitting: Orthopaedic Surgery

## 2018-10-31 ENCOUNTER — Telehealth (RURAL_HEALTH_CENTER): Payer: Self-pay | Admitting: Obstetrics & Gynecology

## 2018-10-31 ENCOUNTER — Ambulatory Visit (INDEPENDENT_AMBULATORY_CARE_PROVIDER_SITE_OTHER): Payer: Medicaid Other | Admitting: Surgical

## 2018-10-31 ENCOUNTER — Encounter (RURAL_HEALTH_CENTER): Payer: Self-pay | Admitting: Nurse Practitioner

## 2018-10-31 ENCOUNTER — Encounter (RURAL_HEALTH_CENTER): Payer: Self-pay | Admitting: Obstetrics & Gynecology

## 2018-10-31 NOTE — Telephone Encounter (Signed)
Called patient in regards to her missed appointment and a no show letter was mailed.

## 2018-10-31 NOTE — Patient Instructions (Addendum)
History of Positive HPV:  Referral to GYN as requested.     Fibromyalgia:  Refill provided for Lyrica and Zanaflex     Right Shoulder Impingement:  Referral to orthopedic surgery.     COPD:  Refill provided on Advair and Albuterol inhaler.     Bipolar:  Refill provided on seroquel.   Keep your appointment with behavioral health.     Insomnia:  Refill provided on trazadone.     Smoking Cessation:  Chantix starter and continuing pack provided. Take as directed.     How to Quit using Tobacco  Tobacco use is one of the hardest habits to break. About half of allpeople who have ever used tobacco have been able to quit. Most peoplewho still use tobacco want to quit. Here are some of the best ways to stop using tobacco.    [facility] offers a free smoking cessation assistance. Information can be viewed via website: PodSocket.fi. Call (640)618-7449.    Keep trying  It takes most tobacco users about eight tries before they can quit entirely. Its important not to give up.  Go cold [place]  Mostformer smokers quit cold [place] (all at once). Trying to cut back gradually doesn't seem to work as well, perhaps because it continues the smoking habit. Also, it is possible to inhale more while smoking fewer cigarettes. This results in the same amount of nicotine in your body!  Get support  Support programs can be a big help, especially for heavy smokers. These groups offer lectures, ways to change behavior, and peer support. Here are some ways to find a support program:   Free national quitline: 800-QUIT-NOW (985)343-0653).   Hospital quit-smoking programs.   American Lung Association: 203-624-8472).   American Cancer Society 253-335-5080).  Support at home is important too. Nontobacco users can offer praise and encouragement.     Over-the-counter medicines  Nicotine replacement therapymay make quittingeasier. Certain aids, such as the nicotine patch, gum, and lozenges, are available without a  prescription. Itis best to use these under a doctors care, though. The skin patch provides a steady supply of nicotine. Nicotine gum and lozenges givetemporary bursts of low levels of nicotine. Both methods reduce the craving for cigarettes. Warning: If youhave nausea, vomiting, dizziness, weakness, or a fast heartbeat, stop using these products and see your doctor.  Prescription medicines  After reviewingyour tobacco use patterns and prior attempts to quit, your doctor may offer a prescription medicine such as bupropion, varenicline, a nicotine inhaler, or nasal spray. Each has advantages and side effects. Your doctor can review these with you.  Health benefits of quitting  The benefits of quitting start right away and keep improving the longer you go without using tobacco.These benefits occur at any age. So whether you are 17 or 70, quitting is a good decision. Some of the benefits include:   20 minutes: Blood pressure and pulse return to normal.   8 hours: Oxygen levels return to normal.   2 days: Ability to smell and taste begin to improve as damaged nerves regrow.   2 to 3 weeks: Circulation and lung function improve.   1 to 9 months: Coughing, congestion, and shortness of breath decrease; tiredness decreases.   1 year: Risk of heart attack decreases by half.   5 years: Risk of lung cancer decreases by half; risk of stroke becomes the same as a nonsmokers.  For more on how to quit smoking, try these online resources:   Smokefree.gov   "Clearing the  Air" booklet from the [employer]: FaceUpdate.com.br.pdf  Date Last Reviewed: 05/20/2013   2000-2016 [employer] [address] [place], [place] [address]. All rights reserved. This information is not intended as a substitute for professional medical care. Always follow your healthcare professional's instructions.    Anxiety:  Clonazepam provided today as a short term dose.  This will need to be filled by your behavioral health specalialst after this prescription.     Follow-up in one month with fasting labs one week prior.     We make every attempt to respond to your calls and emails within 72 hours.  Please note that this is only during business hours.  If you have a question or a problem that needs attention sooner, and it is after hours please call the physician on-call at [phone], these services begin after 5PM on weekdays.  If you have an emergency, call 911.
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
## 2018-11-12 ENCOUNTER — Ambulatory Visit (RURAL_HEALTH_CENTER): Payer: Medicare Other | Admitting: Obstetrics & Gynecology

## 2018-11-14 ENCOUNTER — Ambulatory Visit (RURAL_HEALTH_CENTER): Payer: Self-pay | Admitting: Nurse Practitioner

## 2018-11-15 ENCOUNTER — Ambulatory Visit: Payer: Medicare Other | Attending: Nurse Practitioner | Admitting: Nurse Practitioner

## 2018-11-15 ENCOUNTER — Encounter (RURAL_HEALTH_CENTER): Payer: Self-pay | Admitting: Nurse Practitioner

## 2018-11-15 VITALS — BP 102/60 | HR 90 | Temp 96.8°F | Resp 18 | Ht 65.0 in | Wt 142.8 lb

## 2018-11-15 DIAGNOSIS — Z30013 Encounter for initial prescription of injectable contraceptive: Secondary | ICD-10-CM

## 2018-11-15 DIAGNOSIS — M797 Fibromyalgia: Secondary | ICD-10-CM

## 2018-11-15 DIAGNOSIS — K912 Postsurgical malabsorption, not elsewhere classified: Secondary | ICD-10-CM

## 2018-11-15 DIAGNOSIS — Z23 Encounter for immunization: Secondary | ICD-10-CM

## 2018-11-15 DIAGNOSIS — Z3042 Encounter for surveillance of injectable contraceptive: Secondary | ICD-10-CM

## 2018-11-15 DIAGNOSIS — Z8619 Personal history of other infectious and parasitic diseases: Secondary | ICD-10-CM

## 2018-11-15 LAB — POCT PREGNANCY TEST, URINE HCG: POCT Pregnancy HCG Test, UR: NEGATIVE

## 2018-11-15 MED ORDER — MEDROXYPROGESTERONE 150 MG/ML IM (WRAP)
150.00 mg | Freq: Once | INTRAMUSCULAR | Status: AC
Start: 2018-11-15 — End: 2018-11-15
  Administered 2018-11-15: 13:00:00 150 mg via INTRAMUSCULAR

## 2018-11-15 MED ORDER — TIZANIDINE HCL 4 MG PO TABS
8.0000 mg | ORAL_TABLET | Freq: Three times a day (TID) | ORAL | 1 refills | Status: DC | PRN
Start: 2018-11-15 — End: 2018-12-16

## 2018-11-15 MED ORDER — ONDANSETRON 4 MG PO TBDP
4.00 mg | ORAL_TABLET | Freq: Three times a day (TID) | ORAL | 2 refills | Status: DC | PRN
Start: 2018-11-15 — End: 2018-12-03

## 2018-11-15 MED ORDER — DIPHENOXYLATE-ATROPINE 2.5-0.025 MG PO TABS
2.0000 | ORAL_TABLET | Freq: Four times a day (QID) | ORAL | 2 refills | Status: DC
Start: 2018-11-15 — End: 2018-12-13

## 2018-11-15 MED ORDER — PREGABALIN 100 MG PO CAPS
100.00 mg | ORAL_CAPSULE | Freq: Two times a day (BID) | ORAL | 1 refills | Status: DC
Start: 2018-11-15 — End: 2019-05-03

## 2018-11-15 NOTE — Patient Instructions (Addendum)
Short Gut Syndrome:  Continue Lomotil 4 times a day as needed for diarrhea.   Continue Zofran three times a day as needed for nausea.  You have an appointment on 01/31/2019 with [facility] they are located at [address] [place], [place], [place] [address]    Fibromyalgia:  We will increase your Lyrica to 100 mg twice daily.   Please let me know how you are doing with this dose.   If you do not feel that it is working please call to set up an appointment.   We can get you set up with Mora Appl, PharmD our pain management specialist.     Hepatitis C history:  Hepatitis C screening added to your blood work.   Please get these completed within the next week. I will call you with these results.     Birth Control:  Depo administered today. You will need to make an appointment with [place] every 3 months.     We make every attempt to respond to your calls and emails within 72 hours.  Please note that this is only during business hours.  If you have a question or a problem that needs attention sooner, and it is after hours please call the physician on-call at [phone], these services begin after 5PM on weekdays.  If you have an emergency, call 911.

## 2018-11-15 NOTE — Progress Notes (Signed)
PROGRESS NOTE      Patient Name: [name],[name]  Primary Care Physician: Kathi Simpers, NP      History of Presenting Illness:   [name] is a 33 y.o. female who presents to the office with   Chief Complaint   Patient presents with    Fibromyalgia     Medication review    Diarrhea     Medcation review     Fibromyalgia  She also has a history of fibromyalgia that is managed with Lyrica and as needed Zanaflex. She has been out of her medication since moving to the area. Requests a refill of these medications today along with an increase in her Lyrica.     Short Gut Syndrome:  Patient reports that she has a history of short gut syndrome and has taken lomotil in the past for this along with Zofran for nausea. She reports that she needs to get established with a GI specialist regarding this syndrome and requests one in [place] if possible.     History of Hepatitis C:  She has a history of Hepatitis C and would like to have a screening added to her blood work.     Birth Control:  Patient would also like to discuss birthcontrol options. She has taken Depo in the past and would like to start this again. Her sexual preference is with women but she would like to take something to stop her period. Discussed risks with this medication with verbal understanding.       Past Medical History:     Past Medical History:   Diagnosis Date    Anxiety     Bipolar 1 disorder     Chronic obstructive pulmonary disease     Fibromyalgia     Insomnia        Past Surgical History:     Past Surgical History:   Procedure Laterality Date    APPENDECTOMY      CHOLECYSTECTOMY      COLON RESECTION      COLON SURGERY      HYSTERECTOMY      salpingo-oophorectomy R side        Family History:     Family History   Problem Relation Age of Onset    Hypertension Mother     Diabetes Mother     Leukemia Father     Liver disease Maternal Grandfather     Hyperlipidemia Paternal Grandfather        Social  History:     Social History     Tobacco Use   Smoking Status Current Every Day Smoker    Packs/day: 0.50    Types: Cigarettes   Smokeless Tobacco Never Used     Social History     Substance and Sexual Activity   Alcohol Use Never    Frequency: Never     Social History     Substance and Sexual Activity   Drug Use Not Currently    Comment: opiates -- last use 2013       Allergies:     Allergies   Allergen Reactions    Fentanyl Anaphylaxis    Reglan [Metoclopramide]     Rocephin [Ceftriaxone]     Vancomycin        Medications:     Prior to Admission medications    Medication Sig Start Date End Date Taking? Authorizing Provider   albuterol sulfate HFA (PROVENTIL) 108 (90 Base) MCG/ACT inhaler Inhale 2 puffs into  the lungs every 4 (four) hours as needed for Wheezing or Shortness of Breath 10/17/18 10/17/19 Yes Marja Kays T, NP   clomiPHENE (CLOMID) 50 MG tablet Take 50 mg by mouth Days 3-7   09/12/18  Yes [provider]   clonazePAM (KlonoPIN) 0.5 MG tablet Take 1 tablet (0.5 mg total) by mouth 2 (two) times daily This is to be taken over by psychiatry on Nov 25, 2018 10/17/18 10/17/19 Yes Marja Kays T, NP   fluticasone-salmeterol (ADVAIR HFA) 230-21 MCG/ACT inhaler Inhale 2 puffs into the lungs 2 (two) times daily 10/17/18  Yes Sheylin Scharnhorst T, NP   ibuprofen (ADVIL) 600 MG tablet Take 1 tablet (600 mg total) by mouth every 6 (six) hours as needed for Pain 10/17/18  Yes Gareth Morgan, MD   pregabalin (LYRICA) 75 MG capsule Take 1 capsule (75 mg total) by mouth daily 10/17/18 10/17/19 Yes Skye Plamondon T, NP   QUEtiapine (SEROquel) 400 MG tablet Take 1 tablet (400 mg total) by mouth nightly 10/17/18  Yes Rachelann Enloe T, NP   tiZANidine (ZANAFLEX) 4 MG tablet Take 2 tablets (8 mg total) by mouth every 8 (eight) hours as needed (fibromyalgia pain) 10/17/18  Yes Marja Kays T, NP   traZODone (DESYREL) 150 MG tablet Take 1 tablet (150 mg total) by mouth nightly 10/17/18  Yes Dalbert Stillings T, NP    varenicline (Chantix Continuing Month Pak) 1 MG tablet Take 1 tablet (1 mg total) by mouth 2 (two) times daily 10/17/18  Yes Daegen Berrocal T, NP   varenicline (Chantix Starting Month Pak) 0.5 MG X 11 & 1 MG X 42 tablet Take one 0.5mg  tablet by mouth once daily for 3 days, then increase to one 0.5mg  tablet twice daily for 3 days, then increase to one 1mg  tablet twice daily. 10/17/18  Yes Marja Kays T, NP   diphenoxylate-atropine (Lomotil) 2.5-0.025 MG per tablet Take 1 tablet by mouth 4 (four) times daily as needed for Diarrhea 10/22/18 10/22/19  Marja Kays T, NP   hydrOXYzine (ATARAX) 25 MG tablet Take 1-2 tabs PO TID PRN anxiety. 10/22/18   Hildred Priest, NP   Lurasidone HCl (Latuda) 40 MG Tab Take 1 tablet (40 mg total) by mouth daily 10/22/18   Hildred Priest, NP   ondansetron (Zofran) 4 MG tablet Take 1 tablet (4 mg total) by mouth daily as needed for Nausea 10/22/18 10/22/19  Marja Kays T, NP   traZODone (DESYREL) 150 MG tablet Take 1.5 -2 tabs PO PRN at HS. 10/22/18   Hildred Priest, NP       Review of Systems:      14 system review negative except as noted in the HPI.    Physical Exam:     Vitals:    11/15/18 1157   BP: 102/60   Pulse: 90   Resp: 18   Temp: (!) 96.8 F (36 C)   SpO2: 97%     Body mass index is 23.76 kg/m.    General:  no acute distress. Pleasant and well groomed.  HEENT: eomi, sclera anicteric, normocephalic  Neck: supple, no lymphadenopathy, no thyromegaly, no JVD  Cardiovascular: regular rate and rhythm, no murmurs  Lungs: clear to auscultation bilaterally, without wheezing, rhonchi, or rales  Extremities: no edema  Musculoskeletal: Gait WNL. No swelling, redness of joints.  Neuro:  alert and oriented.   Skin: no rashes or lesions noted  Psychiatric/Behavioral: appropriate affect, mood and behavior.      Assessment:  1. Fibromyalgia    2. Short gut syndrome    3. Hx of hepatitis C    4. Encounter for initial prescription of injectable contraceptive    5. Need for  influenza vaccination        Plan:   Patient Instructions   Short Gut Syndrome:  Continue Lomotil 4 times a day as needed for diarrhea.   Continue Zofran three times a day as needed for nausea.  You have an appointment on 01/31/2019 with [facility] they are located at [address] [place], [place], [place] [address]    Fibromyalgia:  We will increase your Lyrica to 100 mg twice daily.   Please let me know how you are doing with this dose.   If you do not feel that it is working please call to set up an appointment.   We can get you set up with Mora Appl, PharmD our pain management specialist.     Hepatitis C history:  Hepatitis C screening added to your blood work.   Please get these completed within the next week. I will call you with these results.     Birth Control:  Depo administered today. You will need to make an appointment with [place] every 3 months.     We make every attempt to respond to your calls and emails within 72 hours.  Please note that this is only during business hours.  If you have a question or a problem that needs attention sooner, and it is after hours please call the physician on-call at [phone], these services begin after 5PM on weekdays.  If you have an emergency, call 911.          Requested Prescriptions     Signed Prescriptions Disp Refills    ondansetron (Zofran ODT) 4 MG disintegrating tablet 90 tablet 2     Sig: Take 1 tablet (4 mg total) by mouth every 8 (eight) hours as needed for Nausea    pregabalin (LYRICA) 100 MG capsule 60 capsule 1     Sig: Take 1 capsule (100 mg total) by mouth 2 (two) times daily    diphenoxylate-atropine (Lomotil) 2.5-0.025 MG per tablet 240 tablet 2     Sig: Take 2 tablets by mouth 4 (four) times daily    tiZANidine (ZANAFLEX) 4 MG tablet 90 tablet 1     Sig: Take 2 tablets (8 mg total) by mouth every 8 (eight) hours as needed (fibromyalgia pain)        Patient was counseled on possible medicine side effects which may include rash, swelling and/or  stomach upset.  Patient was instructed to notify me or the ER if they experience problems.    Orders Placed This Encounter   Procedures    Flu Vaccine Recombinant Quadrivalent 18 yrs & up PRESERVATIVE FREE (Flublok)    Hepatitis C (HCV) antibody, Total     Standing Status:   Future     Standing Expiration Date:   11/15/2019    Urine HCG POCT     Standing Status:   Standing     Number of Occurrences:   1        Before leaving the office, the patient was informed of all ordered diagnostic tests and consults.     If you have not heard back from [place] about test results in 14 days, please call the office at [phone].    Signed by: Kathi Simpers, NP    Supervising Doctor: Rogelio Seen, MD  The patient's electronic medical record was reviewed, any changes in the past medical history, past surgical history, medications, diagnostic tests were noted, and the record was updated accordingly.     Discussed option available for my chart which provides electronic access to diagnostic results.

## 2018-11-21 ENCOUNTER — Ambulatory Visit (RURAL_HEALTH_CENTER): Payer: Self-pay | Admitting: Nurse Practitioner

## 2018-11-22 ENCOUNTER — Ambulatory Visit (RURAL_HEALTH_CENTER): Payer: Medicare Other

## 2018-11-22 ENCOUNTER — Ambulatory Visit: Payer: Medicare Other | Attending: Family | Admitting: Family

## 2018-11-22 ENCOUNTER — Encounter (RURAL_HEALTH_CENTER): Payer: Self-pay | Admitting: Family

## 2018-11-22 DIAGNOSIS — R059 Cough, unspecified: Secondary | ICD-10-CM

## 2018-11-22 DIAGNOSIS — R0989 Other specified symptoms and signs involving the circulatory and respiratory systems: Secondary | ICD-10-CM | POA: Insufficient documentation

## 2018-11-22 DIAGNOSIS — R110 Nausea: Secondary | ICD-10-CM | POA: Insufficient documentation

## 2018-11-22 DIAGNOSIS — Z0184 Encounter for antibody response examination: Secondary | ICD-10-CM | POA: Insufficient documentation

## 2018-11-22 DIAGNOSIS — Z1159 Encounter for screening for other viral diseases: Secondary | ICD-10-CM

## 2018-11-22 DIAGNOSIS — R05 Cough: Secondary | ICD-10-CM

## 2018-11-22 DIAGNOSIS — Z20828 Contact with and (suspected) exposure to other viral communicable diseases: Secondary | ICD-10-CM | POA: Insufficient documentation

## 2018-11-22 LAB — POCT INFLUENZA A/B
POCT Rapid Influenza A AG: NEGATIVE
POCT Rapid Influenza B AG: NEGATIVE

## 2018-11-22 LAB — VH AMB POCT SOFIA (TM)SARS CORONAVIRUS ANTIGEN FIA: Sofia SARS-CoV-2 Ag POCT: NEGATIVE

## 2018-11-22 MED ORDER — GUAIFENESIN-CODEINE 100-10 MG/5ML PO SYRP
5.00 mL | ORAL_SOLUTION | Freq: Every evening | ORAL | 0 refills | Status: DC | PRN
Start: 2018-11-22 — End: 2018-12-03

## 2018-11-22 NOTE — Progress Notes (Signed)
PROGRESS NOTE      Patient Name: [name],[name]  Primary Care Physician: Kathi Simpers, NP      History of Presenting Illness:   [name] is a 33 y.o. female who presents to the office with   Chief Complaint   Patient presents with   . Shortness of Breath     x 1 week   . Cough   . Chest congestion   . Dizziness   . Diarrhea   . Nausea   . Fatigue     Patient is complaining of shortness of breath, cough and congestion.  Patient is also complaining of dizziness, diarrhea, nausea, fatigue.  Symptoms started approximately 1 week ago.  Patient denies any known exposures to anyone being ill.  She denies any issues with loss of consciousness.  Patient denies fever.  Patient states the cough is very dry, not productive.    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  Patient states she has just had several more episodes during the past few days.      This visit was conducted with the use of interactive audio/video telecommunications that permitted real-time communication between [employer] and myself. She consented to practice patient and received services athome, while I was located at [facility]. [facility].    Past Medical History:     Past Medical History:   Diagnosis Date   . Anxiety    . Bipolar 1 disorder    . Chronic obstructive pulmonary disease    . Fibromyalgia    . Insomnia        Past Surgical History:     Past Surgical History:   Procedure Laterality Date   . APPENDECTOMY     . CHOLECYSTECTOMY     . COLON RESECTION     . COLON SURGERY     . HYSTERECTOMY      salpingo-oophorectomy R side        Family History:     Family History   Problem Relation Age of Onset   . Hypertension Mother    . Diabetes Mother    . Leukemia Father    . Liver disease Maternal Grandfather    . Hyperlipidemia Paternal Grandfather        Social History:     Social History     Tobacco Use   Smoking Status Current Every Day Smoker   . Packs/day: 0.50   . Types:  Cigarettes   Smokeless Tobacco Never Used     Social History     Substance and Sexual Activity   Alcohol Use Never   . Frequency: Never     Social History     Substance and Sexual Activity   Drug Use Not Currently    Comment: opiates -- last use 2013       Allergies:     Allergies   Allergen Reactions   . Fentanyl Anaphylaxis   . Reglan [Metoclopramide]    . Rocephin [Ceftriaxone]    . Vancomycin        Medications:     Prior to Admission medications    Medication Sig Start Date End Date Taking? Authorizing Provider   albuterol sulfate HFA (PROVENTIL) 108 (90 Base) MCG/ACT inhaler Inhale 2 puffs into the lungs every 4 (four) hours as needed for Wheezing or Shortness of Breath 10/17/18 10/17/19 Yes Sharp, Skyler T, NP   clonazePAM (KlonoPIN) 0.5 MG tablet Take 1 tablet (0.5 mg  total) by mouth 2 (two) times daily This is to be taken over by psychiatry on Nov 25, 2018 10/17/18 10/17/19 Yes Marja Kays T, NP   diphenoxylate-atropine (Lomotil) 2.5-0.025 MG per tablet Take 2 tablets by mouth 4 (four) times daily 11/15/18 11/15/19 Yes Marja Kays T, NP   fluticasone-salmeterol (ADVAIR HFA) 230-21 MCG/ACT inhaler Inhale 2 puffs into the lungs 2 (two) times daily 10/17/18  Yes Sharp, Skyler T, NP   hydrOXYzine (ATARAX) 25 MG tablet Take 1-2 tabs PO TID PRN anxiety. 10/22/18  Yes Hildred Priest, NP   ibuprofen (ADVIL) 600 MG tablet Take 1 tablet (600 mg total) by mouth every 6 (six) hours as needed for Pain 10/17/18  Yes Gareth Morgan, MD   Lurasidone HCl (Latuda) 40 MG Tab Take 1 tablet (40 mg total) by mouth daily 10/22/18  Yes Hildred Priest, NP   ondansetron (Zofran ODT) 4 MG disintegrating tablet Take 1 tablet (4 mg total) by mouth every 8 (eight) hours as needed for Nausea 11/15/18  Yes Sharp, Skyler T, NP   pregabalin (LYRICA) 100 MG capsule Take 1 capsule (100 mg total) by mouth 2 (two) times daily 11/15/18 11/15/19 Yes Sharp, Skyler T, NP   QUEtiapine (SEROquel) 400 MG tablet Take 1 tablet (400 mg total)  by mouth nightly 10/17/18  Yes Sharp, Skyler T, NP   tiZANidine (ZANAFLEX) 4 MG tablet Take 2 tablets (8 mg total) by mouth every 8 (eight) hours as needed (fibromyalgia pain) 11/15/18  Yes Marja Kays T, NP   traZODone (DESYREL) 150 MG tablet Take 1.5 -2 tabs PO PRN at HS. 10/22/18  Yes Hildred Priest, NP       Review of Systems:      Constitutional: Negative for fever, fatigue or weight changes.   HENT: Negative for ear pain and neck pain.  Positive for congestion, rhinorrhea.    Eyes: Negative for discharge, redness and itching.   Respiratory: Negative for cough and shortness of breath.    Cardiovascular: Negative for chest pain, palpitations and leg swelling.   Gastrointestinal: Negative for nausea, vomiting, heartburn, abdominal pain, changes in bowel habits.  Genitourinary: Negative for dysuria, urgency and difficulty urinating.   Endocrine: Negative for polyuria, polydipsia, polyphagia, or heat/cold intolerances.  Musculoskeletal: Negative for joint swelling and gait problem.   Neurological: Negative for dizziness, weakness and headaches.   Skin: Negative for rashes or lesions.  Psychiatric/Behavioral: Negative.      Physical Exam:   There were no vitals filed for this visit.  There is no height or weight on file to calculate BMI.    General:  no acute distress. Pleasant and well groomed.  Neck: no JVD, no tracheal deviation  Lungs: Effort normal, no use accessory muscles.  Neuro:  alert and oriented.   Skin: no rashes or lesions noted  Psychiatric/Behavioral: appropriate affect, mood and behavior.      Recent Results (from the past 336 hour(s))   POCT Pregnancy Test, Urine HCG    Collection Time: 11/15/18 12:00 AM   Result Value Ref Range    POCT QC Pass     POCT Pregnancy HCG Test, UR Negative Negative    Comment:       Negative Value is Normal in Healthy Males or Healthy non-pregnant Females      Physical Exam     Assessment:     1. Cough    2. Chest congestion    3. Nausea  Medical Decision  Making:  I discussed with the patient we would COVID and Flu swab her.    I reinforced with her to continue to use her albuterol inhaler.  If she significantly worsens to please go to the ER.  The patient is understanding.      I have provided the patient with the plan of care.  The patient denies any further questions, will follow up as needed.    Plan:   Patient Instructions     Coronavirus Disease 2019 (COVID-19): Overview  Coronavirus disease 2019 (COVID-19) is a respiratory illness. It's caused by a new (novel) coronavirus. There are many types of coronavirus. Coronaviruses are a very common cause of colds and bronchitis. They may sometimes cause lung infection (pneumonia). Symptoms can range from mild to severe. Some people have no symptoms.These viruses are also found in some animals.   All 50 states in the [place] have reported cases of COVID-19. Most states report "community spread" of COVID-19. This means the source of the illness is not known.COVID-19 is a rapidly-emerging infectious disease. This means that scientists are actively researching it.There are information updates regularly.   Public health officials are working to find the source. How the virus spreads is not yet fully understood, but it seems to spread and infect people fairly easily. Some people who have been infected in an area may not be sure how or where they were infected. The virus may be spread through droplets of fluid that a person coughs or sneezes into the air. It may be spread if you touch a surface with the virus on it, such as a handle or object, and then touch your eyes, nose, or mouth.   For the latest information, visit the CDC website at [employer]. Or call 800-CDC-INFO 641-084-0143).   What are the symptoms of COVID-19?  Some people have no symptoms or mild symptoms. Symptoms can also vary from person to person. As experts learn more about COVID-19, other symptoms are being reported. Symptoms may  appear 2 to 14 days after contact with the virus:    Fever or chills   Coughing   Trouble breathing or feeling short of breath   Sore throat   Stuffy or runny nose   Headache and body aches   Fatigue   Nausea, vomiting, diarrhea, or abdominal pain   New loss of sense of smell or taste  You can check your symptoms with the CDC's Coronavirus Self-Checker.   What are possible complications from COVID-19?  In many cases, this virus can cause infection (pneumonia) in both lungs. In some cases, this can cause death. Certain people are at higher risk for complications. This includes older adults and people with serious chronic health conditions such as heart or lung disease, diabetes, or kidney disease. It includes people with health conditions that suppress the immune system. And it includes people taking medicines that suppress the immune system.   As experts learn more about COVID-19, other complications are being reported that may be linked to COVID-19. Rarely, some children have developed severe complications called multisystem inflammatory syndrome in children (MIS-C). MIS-C seems to be similar to [facility] disease, a rare condition causing inflammation of blood vessels and body organs. It's not yet known if MIS-C happens only in children, or if adults are also at risk. It's also not known if it's related to COVID-19, because many children, but not all, have tested positive for the virus. Experts continue to study MIS-C. The CDC advises healthcare  providers to report to local health departments any person under age 57 years old who is ill enough to be in the hospital and has all of the following:    A fever over 100.59F (38.0C) for more than 24 hours and a positive SARS-CoV-2 test or exposure to the virus in the last 4 weeks   Inflammation in at least 2 organs such as the heart, lungs, or kidneys with lab tests that show inflammation   No other diagnoses besides COVID-19 explain the child's symptoms  How  is COVID-19 diagnosed?  Your healthcare provider will ask about your symptoms. He or she will ask where you live, and about your recent travel, and any contact with sick people. If your healthcare provider thinks you may have COVID-19, he or she will consider whether to test you for COVID-19. This depends on the availability of testing in your area, and how sick you are. Follow all instructions from your healthcare provider. Guidelines for testing may change as more information about the virus becomes available. Currently, COVID-19 is diagnosed by:    Viral test.  Viral tests tell if you have a current COVID-19 infection. A nose-throat swab may be wiped inside your nose to the back of your throat. Or a sample of your saliva may be taken. Either of these samples will be checked for the SARS-CoV-2 virus. Availability of tests vary. Some test kits can be done at home. But both nose-throat swabs and saliva tests must be sent to a lab to be looked at.  If your healthcare provider thinks or confirms that you have COVID-19, you may have other tests. These tests may include:    Antibody blood test.  Antibody tests are being looked at to find out if a person has previously been infected with the virus and may now have antibodies such as SARS AB IgG in their blood to give some immunity. The accuracy and availability of antibody tests vary. An antibody test may not be able to show if you have a current infection because it can take up to a few weeks after infection to make antibodies. It's not yet known how long immunity lasts after being infected with the virus.   Sputum culture.  A small sample of mucus coughed from your lungs (sputum) may be collected if you have a moist cough. It may be checked for the virus or to look for pneumonia.   Imaging tests.  You may have a chest X-ray or CT scan.  Note about reinfection and your immunity  At this time, it's unclear if people can be reinfected with COVID-19. The CDC notes that  if a person has fully recovered from COVID-19 and is retested within 3 months of the first infection, they may continue to have low levels of the virus in their body and test positive for COVID-19, even though they are not spreading COVID-19. Having a positive COVID-19 test after an infection doesn't mean you can't be reinfected. It's not yet known how long immunity lasts after being infected with the virus.   How is COVID-19 treated?  There is currently no medicine proven to prevent or treat the virus. Some experimental medicines are being tested for COVID-19. Other medicines used to treat other conditions are being looked at for COVID-19, but these are not currently approved to treat it.   The most proven treatments right now are those to help your body while it fights the virus. This is known as supportive care. Supportive care  may include:    Getting rest.  This helps your body fight the illness.   Staying hydrated.  Drinking liquids is the best way to prevent dehydration.. Try to drink 6 to 8 glasses of liquids every day, or as advised by your provider. Also check with your provider about which fluids are best for you. Don't drink fluids that contain caffeine or alcohol.   Taking over-the-counter (OTC)pain medicine.  These are used to help ease pain and reduce fever. Follow your healthcare provider's instructions for which OTC medicine to use.  For severe illness, you may need to stay in the hospital. Care during severe illness may include:    IV (intravenous) fluids.  These are given through a vein to help keep your body hydrated.   Oxygen. You may be given supplemental oxygen or ventilation with a breathing machine (ventilator). This is done so you get enough oxygen in your body.   Prone positioning.  Depending on how sick you are during your hospital stay, your healthcare team may turn you regularly on your stomach. This is called prone positioning. It helps increase the amount of oxygen you get to  your lungs. Follow your healthcare team's instructions on position changes while you're in the hospital. Also follow their discharge advice on the best positions to help your breathing once you go home.  People who have had COVID-19 and are fully recovered may be asked by their healthcare team to consider donating plasma. This is called COVID-19 convalescent plasma donation. Plasma from people fully recovered from COVID-19 may contain antibodies to help fight COVID-19 in people who are currently seriously ill with the disease. Experts don't know if the donated plasma will work well as a treatment. Research continues, and the FDA has approved it for emergency use in certain people with serious or life-threatening COVID-19. Talk with your provider to learn more about convalescent plasma donation and whether you qualify to donate.   Are you at risk for COVID-19?  You are at risk for COVID-19 if you have had close contact with someone with the virus, or if you live in or traveled to an area with cases of it. Close contact means being within about 6 feet of someone, or living in the same house or visiting a person who has or may have COVID-19. Some recent studies suggest that COVID-19 may be spread by people who are not showing symptoms.   Date last modified: 09/16/2018  [employer] last reviewed this educational content on 01/23/2018   2000-2020 [employer], [place]. [address], [place], [place] [address]. All rights reserved. This information is not intended as a substitute for professional medical care. Always follow your healthcare professional's instructions.             Requested Prescriptions     Signed Prescriptions Disp Refills   . guaiFENesin-codeine (ROBITUSSIN AC) 100-10 MG/5ML syrup 118 mL 0     Sig: Take 5 mLs by mouth nightly as needed for Cough        Patient was counseled on possible medicine side effects which may include rash, swelling and/or stomach upset.  Patient was instructed to notify me or  the ER if they experience problems.    Orders Placed This Encounter   Procedures   . SARS CoV 2 RNA     Standing Status:   Future     Standing Expiration Date:   11/22/2019     Order Specific Question:   Specimen  Answer:   Nasopharyngeal Swab     Order Specific Question:   First Test?     Answer:   N     Order Specific Question:   Is patient employed in a healthcare setting?     Answer:   N     Order Specific Question:   Does patient have symptoms related to condition of interest?     Answer:   Y     Order Specific Question:   Date of Onset     Answer:   11/15/2018     Order Specific Question:   Is the patient hospitalized because of this condition?     Answer:   No     Order Specific Question:   Does patient reside in congregate care setting?     Answer:   N        Before leaving the office, the patient was informed of all ordered diagnostic tests and consults.     If you have not heard back from [place] about test results in 14 days, please call the office at [phone].    Signed by: Volney Presser, NP    Supervising Doctor: Rogelio Seen, MD    The patient's electronic medical record was reviewed, any changes in the past medical history, past surgical history, medications, diagnostic tests were noted, and the record was updated accordingly.     Discussed option available for my chart which provides electronic access to diagnostic results.

## 2018-11-22 NOTE — Patient Instructions (Signed)
Coronavirus Disease 2019 (COVID-19): Overview  Coronavirus disease 2019 (COVID-19) is a respiratory illness. It's caused by a new (novel) coronavirus. There are many types of coronavirus. Coronaviruses are a very common cause of colds and bronchitis. They may sometimes cause lung infection (pneumonia). Symptoms can range from mild to severe. Some people have no symptoms. These viruses are also found in some animals.   All 50 states in the [place] have reported cases of COVID-19. Most states report "community spread" of COVID-19. This means the source of the illness is not known. COVID-19 is a rapidly-emerging infectious disease. This means that scientists are actively researching it. There are information updates regularly.   Public health officials are working to find the source. How the virus spreads is not yet fully understood, but it seems to spread and infect people fairly easily. Some people who have been infected in an area may not be sure how or where they were infected. The virus may be spread through droplets of fluid that a person coughs or sneezes into the air. It may be spread if you touch a surface with the virus on it, such as a handle or object, and then touch your eyes, nose, or mouth.   For the latest information, visit the CDC website at www.cdc.gov/coronavirus/2019-ncov. Or call 800-CDC-INFO (800-232-4636).   What are the symptoms of COVID-19?  Some people have no symptoms or mild symptoms. Symptoms can also vary from person to person. As experts learn more about COVID-19, other symptoms are being reported. Symptoms may appear 2 to 14 days after contact with the virus:   · Fever or chills  · Coughing  · Trouble breathing or feeling short of breath  · Sore throat  · Stuffy or runny nose  · Headache and body aches  · Fatigue  · Nausea, vomiting, diarrhea, or abdominal pain  · New loss of sense of smell or taste  You can check your symptoms with the CDC’s Coronavirus Self-Checker.   What are possible  complications from COVID-19?  In many cases, this virus can cause infection (pneumonia) in both lungs. In some cases, this can cause death. Certain people are at higher risk for complications. This includes older adults and people with serious chronic health conditions such as heart or lung disease, diabetes, or kidney disease. It includes people with health conditions that suppress the immune system. And it includes people taking medicines that suppress the immune system.   As experts learn more about COVID-19, other complications are being reported that may be linked to COVID-19. Rarely, some children have developed severe complications called multisystem inflammatory syndrome in children (MIS-C). MIS-C seems to be similar to [place] disease, a rare condition causing inflammation of blood vessels and body organs. It's not yet known if MIS-C happens only in children, or if adults are also at risk. It's also not known if it's related to COVID-19, because many children, but not all, have tested positive for the virus. Experts continue to study MIS-C. The CDC advises healthcare providers to report to local health departments any person under age 21 years old who is ill enough to be in the hospital and has all of the following:   · A fever over 100.4°F (38.0°C) for more than 24 hours and a positive SARS-CoV-2 test or exposure to the virus in the last 4 weeks  · Inflammation in at least 2 organs such as the heart, lungs, or kidneys with lab tests that show inflammation  · No other diagnoses besides COVID-19 explain   the child's symptoms  How is COVID-19 diagnosed?  Your healthcare provider will ask about your symptoms. He or she will ask where you live, and about your recent travel, and any contact with sick people. If your healthcare provider thinks you may have COVID-19, he or she will consider whether to test you for COVID-19. This depends on the availability of testing in your area, and how sick you are. Follow all  instructions from your healthcare provider. Guidelines for testing may change as more information about the virus becomes available. Currently, COVID-19 is diagnosed by:   · Viral test.  Viral tests tell if you have a current COVID-19 infection. A nose-throat swab may be wiped inside your nose to the back of your throat. Or a sample of your saliva may be taken. Either of these samples will be checked for the SARS-CoV-2 virus. Availability of tests vary. Some test kits can be done at home. But both nose-throat swabs and saliva tests must be sent to a lab to be looked at.  If your healthcare provider thinks or confirms that you have COVID-19, you may have other tests. These tests may include:   · Antibody blood test.  Antibody tests are being looked at to find out if a person has previously been infected with the virus and may now have antibodies such as SARS AB IgG in their blood to give some immunity. The accuracy and availability of antibody tests vary. An antibody test may not be able to show if you have a current infection because it can take up to a few weeks after infection to make antibodies. It's not yet known how long immunity lasts after being infected with the virus.  · Sputum culture.  A small sample of mucus coughed from your lungs (sputum) may be collected if you have a moist cough. It may be checked for the virus or to look for pneumonia.  · Imaging tests.  You may have a chest X-ray or CT scan.  Note about reinfection and your immunity  At this time, it's unclear if people can be reinfected with COVID-19. The CDC notes that if a person has fully recovered from COVID-19 and is retested within 3 months of the first infection, they may continue to have low levels of the virus in their body and test positive for COVID-19, even though they are not spreading COVID-19. Having a positive COVID-19 test after an infection doesn't mean you can't be reinfected. It's not yet known how long immunity lasts after  being infected with the virus.   How is COVID-19 treated?  There is currently no medicine proven to prevent or treat the virus. Some experimental medicines are being tested for COVID-19. Other medicines used to treat other conditions are being looked at for COVID-19, but these are not currently approved to treat it.   The most proven treatments right now are those to help your body while it fights the virus. This is known as supportive care. Supportive care may include:   · Getting rest.  This helps your body fight the illness.  · Staying hydrated.  Drinking liquids is the best way to prevent dehydration.. Try to drink 6 to 8 glasses of liquids every day, or as advised by your provider. Also check with your provider about which fluids are best for you. Don't drink fluids that contain caffeine or alcohol.  · Taking over-the-counter (OTC) pain medicine.  These are used to help ease pain and reduce fever. Follow your healthcare   provider's instructions for which OTC medicine to use.  For severe illness, you may need to stay in the hospital. Care during severe illness may include:   · IV (intravenous) fluids.  These are given through a vein to help keep your body hydrated.  · Oxygen. You may be given supplemental oxygen or ventilation with a breathing machine (ventilator). This is done so you get enough oxygen in your body.  · Prone positioning.  Depending on how sick you are during your hospital stay, your healthcare team may turn you regularly on your stomach. This is called prone positioning. It helps increase the amount of oxygen you get to your lungs. Follow your healthcare team's instructions on position changes while you're in the hospital. Also follow their discharge advice on the best positions to help your breathing once you go home.  People who have had COVID-19 and are fully recovered may be asked by their healthcare team to consider donating plasma. This is called COVID-19 convalescent plasma donation.  Plasma from people fully recovered from COVID-19 may contain antibodies to help fight COVID-19 in people who are currently seriously ill with the disease. Experts don't know if the donated plasma will work well as a treatment. Research continues, and the FDA has approved it for emergency use in certain people with serious or life-threatening COVID-19. Talk with your provider to learn more about convalescent plasma donation and whether you qualify to donate.   Are you at risk for COVID-19?  You are at risk for COVID-19 if you have had close contact with someone with the virus, or if you live in or traveled to an area with cases of it. Close contact means being within about 6 feet of someone, or living in the same house or visiting a person who has or may have COVID-19. Some recent studies suggest that COVID-19 may be spread by people who are not showing symptoms.   Date last modified: 09/16/2018  [employer] last reviewed this educational content on 01/23/2018  © 2000-2020 [employer] [address], [place] [address]. All rights reserved. This information is not intended as a substitute for professional medical care. Always follow your healthcare professional's instructions.

## 2018-11-23 ENCOUNTER — Other Ambulatory Visit
Admission: RE | Admit: 2018-11-23 | Discharge: 2018-11-23 | Disposition: A | Discharge status: Home / Self Care | Payer: Medicare Other | Source: Ambulatory Visit | Attending: Family | Admitting: Family

## 2018-11-23 DIAGNOSIS — R059 Cough, unspecified: Secondary | ICD-10-CM

## 2018-11-23 DIAGNOSIS — R0989 Other specified symptoms and signs involving the circulatory and respiratory systems: Secondary | ICD-10-CM

## 2018-11-23 DIAGNOSIS — R110 Nausea: Secondary | ICD-10-CM

## 2018-11-25 ENCOUNTER — Ambulatory Visit (RURAL_HEALTH_CENTER): Payer: Medicare Other | Admitting: Obstetrics & Gynecology

## 2018-11-25 ENCOUNTER — Ambulatory Visit (INDEPENDENT_AMBULATORY_CARE_PROVIDER_SITE_OTHER): Payer: Self-pay | Admitting: Family

## 2018-11-25 LAB — VH SARS COV 2 RNA
Date of Onset: 20201023
SARS-CoV-2 RNA (COVID-19) Qualitative NAAT: NOT DETECTED

## 2018-11-25 NOTE — Progress Notes (Signed)
Flu and covid negative

## 2018-11-25 NOTE — Progress Notes (Signed)
Send out COVID negative as well.

## 2018-11-28 ENCOUNTER — Telehealth (INDEPENDENT_AMBULATORY_CARE_PROVIDER_SITE_OTHER): Payer: Medicaid Other | Admitting: Family

## 2018-12-02 NOTE — Progress Notes (Signed)
NO APPT SCHEDULED

## 2018-12-03 ENCOUNTER — Ambulatory Visit: Payer: Medicare Other | Attending: Nurse Practitioner | Admitting: Nurse Practitioner

## 2018-12-03 ENCOUNTER — Emergency Department: Payer: Medicare Other

## 2018-12-03 ENCOUNTER — Emergency Department
Admission: EM | Admit: 2018-12-03 | Discharge: 2018-12-04 | Disposition: A | Discharge status: Home / Self Care | Payer: Medicare Other | Attending: Emergency Medicine | Admitting: Emergency Medicine

## 2018-12-03 ENCOUNTER — Encounter (RURAL_HEALTH_CENTER): Payer: Self-pay | Admitting: Nurse Practitioner

## 2018-12-03 VITALS — BP 100/56 | HR 114 | Temp 96.6°F | Resp 18 | Ht 65.0 in | Wt 139.0 lb

## 2018-12-03 DIAGNOSIS — R197 Diarrhea, unspecified: Secondary | ICD-10-CM | POA: Insufficient documentation

## 2018-12-03 DIAGNOSIS — R1033 Periumbilical pain: Secondary | ICD-10-CM | POA: Insufficient documentation

## 2018-12-03 DIAGNOSIS — M5441 Lumbago with sciatica, right side: Secondary | ICD-10-CM

## 2018-12-03 DIAGNOSIS — R110 Nausea: Secondary | ICD-10-CM | POA: Insufficient documentation

## 2018-12-03 DIAGNOSIS — K567 Ileus, unspecified: Secondary | ICD-10-CM | POA: Insufficient documentation

## 2018-12-03 LAB — CBC AND DIFFERENTIAL
Basophils %: 0.7 % (ref 0.0–3.0)
Basophils Absolute: 0.1 10*3/uL (ref 0.0–0.3)
Eosinophils %: 2.4 % (ref 0.0–7.0)
Eosinophils Absolute: 0.3 10*3/uL (ref 0.0–0.8)
Hematocrit: 43.7 % (ref 36.0–48.0)
Hemoglobin: 15 gm/dL (ref 12.0–16.0)
Lymphocytes Absolute: 2.8 10*3/uL (ref 0.6–5.1)
Lymphocytes: 23.8 % (ref 15.0–46.0)
MCH: 34 pg (ref 28–35)
MCHC: 34 gm/dL (ref 31–36)
MCV: 98 fL (ref 80–100)
MPV: 7.8 fL (ref 6.0–10.0)
Monocytes Absolute: 1.3 10*3/uL (ref 0.1–1.7)
Monocytes: 10.6 % (ref 3.0–15.0)
Neutrophils %: 62.6 % (ref 42.0–78.0)
Neutrophils Absolute: 7.4 10*3/uL (ref 1.7–8.6)
PLT CT: 290 10*3/uL (ref 130–440)
RBC: 4.46 10*6/uL (ref 3.80–5.00)
RDW: 11.3 % (ref 10.5–14.5)
WBC: 11.8 10*3/uL — ABNORMAL HIGH (ref 4.0–11.0)

## 2018-12-03 LAB — COMPREHENSIVE METABOLIC PANEL
ALT: 11 U/L (ref 0–55)
AST (SGOT): 13 U/L (ref 10–42)
Albumin/Globulin Ratio: 1.31 Ratio (ref 0.80–2.00)
Albumin: 3.8 gm/dL (ref 3.5–5.0)
Alkaline Phosphatase: 105 U/L (ref 40–145)
Anion Gap: 13.2 mMol/L (ref 7.0–18.0)
BUN / Creatinine Ratio: 15.7 Ratio (ref 10.0–30.0)
BUN: 14 mg/dL (ref 7–22)
Bilirubin, Total: 0.4 mg/dL (ref 0.1–1.2)
CO2: 18.5 mMol/L — ABNORMAL LOW (ref 20.0–30.0)
Calcium: 8.3 mg/dL — ABNORMAL LOW (ref 8.5–10.5)
Chloride: 110 mMol/L (ref 98–110)
Creatinine: 0.89 mg/dL (ref 0.60–1.20)
EGFR: 86 mL/min/{1.73_m2} (ref 60–150)
Globulin: 2.9 gm/dL (ref 2.0–4.0)
Glucose: 92 mg/dL (ref 71–99)
Osmolality Calculated: 276 mOsm/kg (ref 275–300)
Potassium: 3.7 mMol/L (ref 3.5–5.3)
Protein, Total: 6.7 gm/dL (ref 6.0–8.3)
Sodium: 138 mMol/L (ref 136–147)

## 2018-12-03 LAB — LIPASE: Lipase: 37 U/L (ref 8–78)

## 2018-12-03 MED ORDER — HYDROCODONE-ACETAMINOPHEN 5-325 MG PO TABS
2.00 | ORAL_TABLET | Freq: Once | ORAL | Status: AC
Start: 2018-12-03 — End: 2018-12-04
  Administered 2018-12-04: 2

## 2018-12-03 MED ORDER — IOHEXOL 240 MG/ML IJ SOLN
50.00 mL | Freq: Once | INTRAMUSCULAR | Status: AC
Start: 2018-12-03 — End: 2018-12-03
  Administered 2018-12-03: 21:00:00 50 mL via ORAL

## 2018-12-03 MED ORDER — IOHEXOL 240 MG/ML IJ SOLN
INTRAMUSCULAR | Status: AC
Start: 2018-12-03 — End: ?
  Filled 2018-12-03: qty 50

## 2018-12-03 MED ORDER — MORPHINE SULFATE 4 MG/ML IJ/IV SOLN (WRAP)
Status: AC
Start: 2018-12-03 — End: ?
  Filled 2018-12-03: qty 1

## 2018-12-03 MED ORDER — MORPHINE SULFATE 4 MG/ML IJ/IV SOLN (WRAP)
4.0000 mg | Freq: Once | Status: AC
Start: 2018-12-03 — End: 2018-12-03
  Administered 2018-12-03: 22:00:00 4 mg via INTRAVENOUS

## 2018-12-03 MED ORDER — IBUPROFEN 800 MG PO TABS
800.0000 mg | ORAL_TABLET | Freq: Three times a day (TID) | ORAL | 1 refills | Status: DC | PRN
Start: 2018-12-03 — End: 2019-02-19

## 2018-12-03 MED ORDER — SODIUM CHLORIDE 0.9 % IV BOLUS
1000.00 mL | Freq: Once | INTRAVENOUS | Status: AC
Start: 2018-12-03 — End: 2018-12-03
  Administered 2018-12-03: 21:00:00 1000 mL via INTRAVENOUS

## 2018-12-03 MED ORDER — HYDROCODONE-ACETAMINOPHEN 5-325 MG PO TABS
1.0000 | ORAL_TABLET | Freq: Four times a day (QID) | ORAL | 0 refills | Status: DC | PRN
Start: 2018-12-03 — End: 2018-12-09

## 2018-12-03 MED ORDER — ONDANSETRON HCL 4 MG/2ML IJ SOLN
INTRAMUSCULAR | Status: AC
Start: 2018-12-03 — End: ?
  Filled 2018-12-03: qty 2

## 2018-12-03 MED ORDER — MORPHINE SULFATE 4 MG/ML IJ/IV SOLN (WRAP)
4.0000 mg | Freq: Once | Status: AC
Start: 2018-12-03 — End: 2018-12-03
  Administered 2018-12-03: 23:00:00 4 mg via INTRAVENOUS

## 2018-12-03 MED ORDER — IOHEXOL 350 MG/ML IV SOLN
100.00 mL | Freq: Once | INTRAVENOUS | Status: AC | PRN
Start: 2018-12-03 — End: 2018-12-03
  Administered 2018-12-03: 100 mL via INTRAVENOUS

## 2018-12-03 MED ORDER — ONDANSETRON 4 MG PO TBDP
4.00 mg | ORAL_TABLET | Freq: Three times a day (TID) | ORAL | 0 refills | Status: DC | PRN
Start: 2018-12-03 — End: 2018-12-13

## 2018-12-03 MED ORDER — ONDANSETRON HCL 4 MG/2ML IJ SOLN
4.00 mg | Freq: Once | INTRAMUSCULAR | Status: AC
Start: 2018-12-03 — End: 2018-12-03
  Administered 2018-12-03: 21:00:00 4 mg via INTRAVENOUS

## 2018-12-03 MED ORDER — HYDROCODONE-ACETAMINOPHEN 5-325 MG PO TABS
ORAL_TABLET | ORAL | Status: AC
Start: 2018-12-03 — End: ?
  Filled 2018-12-03: qty 2

## 2018-12-03 NOTE — ED Provider Notes (Signed)
[facility] EMERGENCY DEPARTMENT History and Physical Exam      Patient Name: [name], [name]  Encounter Date:  12/03/2018  Attending Physician: Justice Britain, MD  PCP: Kathi Simpers, NP  Patient DOB:  September 05, 1985  MRN:  [id number]  Room:  E6/ED6-A      History of Presenting Illness     Chief complaint: Abdominal Pain and Nausea    HPI/ROS is limited by: none  HPI/ROS given by: patient    Location: mid abdomen  Duration: 3 days  Severity: moderate    [name] is a 33 y.o. female who presents with nausea for 3 days and abdominal pain.  She reports her bowels been loose.  She reports she has had numerous prior surgeries and numerous prior bowel obstructions.  This reminds her of prior episodes.  She denies any fevers or urinary symptoms      Review of Systems     Review of Systems   Constitutional: Negative for fever.   Gastrointestinal: Positive for abdominal pain, diarrhea and nausea. Negative for vomiting.   Genitourinary: Negative for dysuria.   Musculoskeletal: Positive for back pain.   All other systems reviewed and are negative.      Allergies     Pt is allergic to fentanyl; reglan [metoclopramide]; rocephin [ceftriaxone]; tramadol; and vancomycin.    Medications       Current Facility-Administered Medications:     HYDROcodone-acetaminophen (NORCO) tablet 5-325 mg (Take Home Medication), 2 tablet, Other, Once, Justice Britain, MD    Current Outpatient Medications:     albuterol sulfate HFA (PROVENTIL) 108 (90 Base) MCG/ACT inhaler, Inhale 2 puffs into the lungs every 4 (four) hours as needed for Wheezing or Shortness of Breath, Disp: 1 Inhaler, Rfl: 2    clonazePAM (KlonoPIN) 0.5 MG tablet, Take 1 tablet (0.5 mg total) by mouth 2 (two) times daily This is to be taken over by psychiatry on Nov 25, 2018, Disp: 60 tablet, Rfl: 1    diphenoxylate-atropine (Lomotil) 2.5-0.025 MG per tablet, Take 2 tablets by mouth 4 (four) times daily, Disp: 240 tablet, Rfl: 2     fluticasone-salmeterol (ADVAIR HFA) 230-21 MCG/ACT inhaler, Inhale 2 puffs into the lungs 2 (two) times daily, Disp: 12 g, Rfl: 2    pregabalin (LYRICA) 100 MG capsule, Take 1 capsule (100 mg total) by mouth 2 (two) times daily, Disp: 60 capsule, Rfl: 1    tiZANidine (ZANAFLEX) 4 MG tablet, Take 2 tablets (8 mg total) by mouth every 8 (eight) hours as needed (fibromyalgia pain), Disp: 90 tablet, Rfl: 1    traZODone (DESYREL) 150 MG tablet, Take 1.5 -2 tabs PO PRN at HS., Disp: 60 tablet, Rfl: 1    HYDROcodone-acetaminophen (NORCO) 5-325 MG per tablet, Take 1 tablet by mouth every 6 (six) hours as needed for Pain, Disp: 8 tablet, Rfl: 0    ibuprofen (ADVIL) 800 MG tablet, Take 1 tablet (800 mg total) by mouth every 8 (eight) hours as needed for Pain, Disp: 30 tablet, Rfl: 1    ondansetron (Zofran ODT) 4 MG disintegrating tablet, Take 1 tablet (4 mg total) by mouth every 8 (eight) hours as needed for Nausea, Disp: 8 tablet, Rfl: 0     Past Medical History     Pt has a past medical history of Anxiety, Bipolar 1 disorder, Chronic obstructive pulmonary disease, Fibromyalgia, and Insomnia.    Past Surgical History     Pt has a past surgical history that includes COLON RESECTION; Colon surgery;  Cholecystectomy; Appendectomy; and Hysterectomy.    Family History     The family history includes Diabetes in her mother; Hyperlipidemia in her paternal grandfather; Hypertension in her mother; Leukemia in her father; Liver disease in her maternal grandfather.    Social History     Pt reports that she has been smoking cigarettes. She has been smoking about 0.50 packs per day. She has never used smokeless tobacco. She reports previous drug use. She reports that she does not drink alcohol.    Physical Exam     Blood pressure 118/75, pulse 79, temperature 98.2 F (36.8 C), temperature source Oral, resp. rate 16, height 1.651 m, weight 64.1 kg, last menstrual period 11/05/2018, SpO2 100 %.    Constitutional: Vital signs  reviewed. Well appearing.  Head: Normocephalic, atraumatic  Eyes: Conjunctiva and sclera are normal.  No injection or discharge.  Ears, Nose, Throat:  Normal external examination of the nose and ears.    Neck: Normal range of motion. Trachea midline.  Respiratory/Chest: Clear to auscultation. No respiratory distress.   Cardiovascular: Regular rate and rhythm.  Abdomen:  No rebound or guarding. Soft.  Mild periumbilical TTP.  Hyperactive bowel sounds  Back: no cva tenderness    Upper Extremity:  No edema. No cyanosis.  Lower Extremity:  No edema. No cyanosis.  Skin: Warm and dry. No rash.  Psychiatric:  Normal affect.  Normal insight  Neuro: alert and oriented.    Orders Placed     Orders Placed This Encounter   Procedures    CT Abdomen Pelvis W IV And PO Cont    CBC and differential    Comprehensive metabolic panel    Lipase    Saline lock IV       Diagnostic Results       The results of the diagnostic studies below have been reviewed by myself:    Labs  Results     Procedure Component Value Units Date/Time    Comprehensive metabolic panel [[id number]]  (Abnormal) Collected: 12/03/18 2119    Specimen: Plasma Updated: 12/03/18 2144     Sodium 138 mMol/L      Potassium 3.7 mMol/L      Chloride 110 mMol/L      CO2 18.5 mMol/L      Calcium 8.3 mg/dL      Glucose 92 mg/dL      Creatinine 4.09 mg/dL      BUN 14 mg/dL      Protein, Total 6.7 gm/dL      Albumin 3.8 gm/dL      Alkaline Phosphatase 105 U/L      ALT 11 U/L      AST (SGOT) 13 U/L      Bilirubin, Total 0.4 mg/dL      Albumin/Globulin Ratio 1.31 Ratio      Anion Gap 13.2 mMol/L      BUN / Creatinine Ratio 15.7 Ratio      EGFR 86 mL/min/1.46m2      Osmolality Calculated 276 mOsm/kg      Globulin 2.9 gm/dL     Lipase [[id number]] Collected: 12/03/18 2119    Specimen: Plasma Updated: 12/03/18 2144     Lipase 37 U/L     CBC and differential [[id number]]  (Abnormal) Collected: 12/03/18 2119    Specimen: Blood Updated: 12/03/18 2127     WBC 11.8 K/cmm      RBC 4.46  M/cmm      Hemoglobin 15.0 gm/dL      Hematocrit  43.7 %      MCV 98 fL      MCH 34 pg      MCHC 34 gm/dL      RDW 40.9 %      PLT CT 290 K/cmm      MPV 7.8 fL      Neutrophils % 62.6 %      Lymphocytes 23.8 %      Monocytes 10.6 %      Eosinophils % 2.4 %      Basophils % 0.7 %      Neutrophils Absolute 7.4 K/cmm      Lymphocytes Absolute 2.8 K/cmm      Monocytes Absolute 1.3 K/cmm      Eosinophils Absolute 0.3 K/cmm      Basophils Absolute 0.1 K/cmm           Radiologic Studies  Radiology Results (24 Hour)     Procedure Component Value Units Date/Time    CT Abdomen Pelvis W IV And PO Cont [[id number]] Collected: 12/03/18 2339    Order Status: Completed Updated: 12/03/18 2345    Narrative:      Clinical History:  pain, nausea, loose stool.  h/o numerous SBO    Ordering Comments:   None.      Study Notes:   Patient presents with nausea for 3 days and abdominal pain.  She reports her bowels been loose.  She reports she has had numerous prior surgeries and numerous prior bowel obstructions     Examination:  CT scan of the abdomen and pelvis with oral and intravenous contrast.    CT images were acquired utilizing Automated Exposure Control for dose reduction. Sagittal and coronal reformations were reviewed.    Contrast:  IOHEXOL 350 MG/ML IV SOLN/100 mL    Comparison:  None available.    Findings:    Lower chest:  Lung bases: The visualized portions of the lung bases are clear.  Heart: The heart is normal in size.    Abdomen:  Liver: Unremarkable.  Biliary system: No biliary ductal dilatation is seen. The gallbladder is removed.  Pancreas:Unremarkable.  Spleen: Unremarkable.  Adrenal glands:Unremarkable.  Kidneys: Extrarenal pelvis.  Stomach: Unremarkable.  Bowel: Partial large bowel resection. Fluid debris throughout colon with multiple air-fluid levels. Enteric contrast in stomach to distal small bowel. Small bowel caliber prominent under 3 cm. No suspicious transition.  Appendix: The appendix is normal in caliber  without surrounding inflammatory changes.  Peritoneal cavity:No free air, free fluid or pathologic adenopathy is identified.  Retroperitoneum: There are no significant retroperitoneal inflammatory changes. No pathologic adenopathy is seen. The aorta and IVC are normal in course and caliber.    Pelvis:  Bladder: Unremarkable.  Reproductive system: No abnormal adnexal masses are seen. The uterus is grossly normal in appearance.  Miscellaneous: No free fluid or pathologic adenopathy is seen in the pelvis.    Other:  Bones: The regional osseous structures appear intact without suspicious blastic or lytic lesions.  Body wall: No subcutaneous masses are seen.      Impression:      Ileus with nonspecific malabsorptive process.    Recommendations:  None.    ReadingStation:[id number]          EKG: none      MDM / Critical Care     Blood pressure 118/75, pulse 79, temperature 98.2 F (36.8 C), temperature source Oral, resp. rate 16, height 1.651 m, weight 64.1 kg, last menstrual period 11/05/2018, SpO2 100 %.  DDX includes bowel obstruction, gastroenteritis, chronic abdominal pain, ileus among others.    ED Course     CT scan consistent with ileus.  Observation hospital was recommended however patient is very familiar with her disease process and prefers to be discharged.  She was encouraged to stay on clear liquids and to return to the emergency department in 12 to 24 hours for recheck if her symptoms do not improve.         Procedures         Diagnosis / Disposition     Clinical Impression  1. Ileus        Disposition  ED Disposition     ED Disposition Condition Date/Time Comment    Discharge  Tue Dec 03, 2018 11:56 PM Fendi Arvilla Meres discharge to home/self care.    Condition at disposition: Stable          Prescriptions  New Prescriptions    HYDROCODONE-ACETAMINOPHEN (NORCO) 5-325 MG PER TABLET    Take 1 tablet by mouth every 6 (six) hours as needed for Pain    ONDANSETRON (ZOFRAN ODT) 4 MG DISINTEGRATING  TABLET    Take 1 tablet (4 mg total) by mouth every 8 (eight) hours as needed for Nausea                  Justice Britain, MD  12/03/18 2357

## 2018-12-03 NOTE — Progress Notes (Signed)
PROGRESS NOTE      Patient Name: [name],[name]  Primary Care Physician: Kathi Simpers, NP      History of Presenting Illness:   [name] is a 33 y.o. female who presents to the office with   Chief Complaint   Patient presents with    Back Pain     Back Pain  Patient presents for evaluation of low back problems. Symptoms have been present for 4 days and include pain in low back (aching and [name] in character; 8/10 in severity). Initial inciting event: none. Symptoms are worse in the morning. Alleviating factors identifiable by the patient are none. Aggravating factors identifiable by the patient are bending backwards, bending forwards, sitting and walking. Treatments initiated by the patient: ice/heat. She has also been using her Zanaflex which she states has not been helpful. She has  Previous lower back problems: none. Previous work up: none. Previous treatments: none.    Past Medical History:     Past Medical History:   Diagnosis Date    Anxiety     Bipolar 1 disorder     Chronic obstructive pulmonary disease     Fibromyalgia     Insomnia        Past Surgical History:     Past Surgical History:   Procedure Laterality Date    APPENDECTOMY      CHOLECYSTECTOMY      COLON RESECTION      COLON SURGERY      HYSTERECTOMY      salpingo-oophorectomy R side        Family History:     Family History   Problem Relation Age of Onset    Hypertension Mother     Diabetes Mother     Leukemia Father     Liver disease Maternal Grandfather     Hyperlipidemia Paternal Grandfather        Social History:     Social History     Tobacco Use   Smoking Status Current Every Day Smoker    Packs/day: 0.50    Types: Cigarettes   Smokeless Tobacco Never Used     Social History     Substance and Sexual Activity   Alcohol Use Never    Frequency: Never     Social History     Substance and Sexual Activity   Drug Use Not Currently    Comment: opiates -- last use 2013       Allergies:     Allergies    Allergen Reactions    Fentanyl Anaphylaxis    Reglan [Metoclopramide]     Rocephin [Ceftriaxone]     Tramadol     Vancomycin        Medications:     Prior to Admission medications    Medication Sig Start Date End Date Taking? Authorizing Provider   albuterol sulfate HFA (PROVENTIL) 108 (90 Base) MCG/ACT inhaler Inhale 2 puffs into the lungs every 4 (four) hours as needed for Wheezing or Shortness of Breath 10/17/18 10/17/19 Yes Marja Kays T, NP   clonazePAM (KlonoPIN) 0.5 MG tablet Take 1 tablet (0.5 mg total) by mouth 2 (two) times daily This is to be taken over by psychiatry on Nov 25, 2018 10/17/18 10/17/19 Yes Marja Kays T, NP   diphenoxylate-atropine (Lomotil) 2.5-0.025 MG per tablet Take 2 tablets by mouth 4 (four) times daily 11/15/18 11/15/19 Yes Marja Kays T, NP   fluticasone-salmeterol (ADVAIR HFA) 230-21 MCG/ACT inhaler Inhale 2 puffs into  the lungs 2 (two) times daily 10/17/18  Yes Kody Brandl T, NP   ondansetron (Zofran ODT) 4 MG disintegrating tablet Take 1 tablet (4 mg total) by mouth every 8 (eight) hours as needed for Nausea 11/15/18  Yes Analy Bassford T, NP   pregabalin (LYRICA) 100 MG capsule Take 1 capsule (100 mg total) by mouth 2 (two) times daily 11/15/18 11/15/19 Yes Shanecia Hoganson T, NP   tiZANidine (ZANAFLEX) 4 MG tablet Take 2 tablets (8 mg total) by mouth every 8 (eight) hours as needed (fibromyalgia pain) 11/15/18  Yes Marja Kays T, NP   traZODone (DESYREL) 150 MG tablet Take 1.5 -2 tabs PO PRN at HS. 10/22/18  Yes Hildred Priest, NP   ibuprofen (ADVIL) 600 MG tablet Take 1 tablet (600 mg total) by mouth every 6 (six) hours as needed for Pain 10/17/18 12/03/18 Yes Gareth Morgan, MD   ibuprofen (ADVIL) 800 MG tablet Take 1 tablet (800 mg total) by mouth every 8 (eight) hours as needed for Pain 12/03/18   Marja Kays T, NP   guaiFENesin-codeine (ROBITUSSIN AC) 100-10 MG/5ML syrup Take 5 mLs by mouth nightly as needed for Cough 11/22/18 12/03/18  Deviers, Richarda Osmond, NP   hydrOXYzine (ATARAX) 25 MG tablet Take 1-2 tabs PO TID PRN anxiety. 10/22/18 12/03/18  Hildred Priest, NP   Lurasidone HCl (Latuda) 40 MG Tab Take 1 tablet (40 mg total) by mouth daily 10/22/18 12/03/18  Hildred Priest, NP   QUEtiapine (SEROquel) 400 MG tablet Take 1 tablet (400 mg total) by mouth nightly 10/17/18 12/03/18  Marja Kays T, NP       Review of Systems:      14 system review negative except as noted in the HPI.    Physical Exam:     Vitals:    12/03/18 0909   BP: 100/56   Pulse: (!) 114   Resp: 18   Temp: (!) 96.6 F (35.9 C)   SpO2: 96%     Body mass index is 23.13 kg/m.    General:  no acute distress. Pleasant and well groomed.  HEENT: eomi, sclera anicteric, normocephalic  Neck: supple, no lymphadenopathy, no thyromegaly, no JVD  Cardiovascular: regular rate and rhythm, no murmurs  Lungs: clear to auscultation bilaterally, without wheezing, rhonchi, or rales  Extremities: no edema  Musculoskeletal: Gait WNL. No swelling, redness of joints. paralumbar tenderness  Neuro:  alert and oriented.   Skin: no rashes or lesions noted  Psychiatric/Behavioral: appropriate affect, mood and behavior.      Assessment:     1. Acute bilateral low back pain with right-sided sciatica        Plan:   Patient Instructions   Low Back Pain:   Nothing on exam to suggest red flag warning symptom or sign.  Felt to be functional / biomechanical Low back pain paralumbar spasms. If you notice that you start having numbness and tinging that is in your perineal area (area that would touch if you was sitting on a saddle) and/or loose function of your bowel or bladder. Go to the emergency department.    Pt instructed in self back care including:   Icy/hot to back muscles and/or alternating ice & heat every few hours as needed for pain.     Tylenol or Ibuprofen as directed.    Make sure you are using good posture when sitting and sleeping to ensure comfort.     Home exercises prescribed and reviewed along with gentle  ROM stretches. (see below)    If pain continues we will consider PT.    IF pain persists greater than 2-3 wks, please make an appointment to be re-evaluated.      Back Care Tips    These are things you can do to prevent a recurrence of acute back pain and to reduce symptoms from chronic back pain:   Maintain a healthy weight. If you are overweight, losing weight will help most types of back pain.   Exercise is an important part of recovery from most types of back pain. The back is supported by the muscles behind and in front of the spine. This means both the back muscles and the abdominal muscles must be strengthened to provide better support for your spine.   Swimming and brisk walking are good overall exercises to improve your fitness level.   Practice safe lifting methods (below).   Practice good posture when sitting, standing and walking. Avoid prolonged sitting. This puts more stress on the lower back than standing or walking.   Wear quality shoes with sufficient arch support. Foot and ankle alignment can affect back symptoms. Women should avoid high heels.   Therapeutic massage can help relieve acute and chronic back pain.   During the first two days after an acute injury or flare-up of chronic back pain, apply anice packto the painful area for 20 minutes every 2-4 hours. This will reduce swelling and pain. Heat (hot shower, hot bath, or heating pad) works well for muscle spasm. You can start with ice, then switch to heat after two days. Some patients feel best alternating ice and heat treatments. Use the one method that feels the best to you.   You may use acetaminophen (Tylenol) or ibuprofen (Motrin, Advil) to control pain, unless another medicine was prescribed. [NOTE: If you have chronic liver or kidney disease or ever had a stomach ulcer or GI bleeding, talk with your doctor before using these medicines.]  Lumbar Stretch   Here is a simple stretching exercise that will help relax muscle  spasm and keep your back more limber. If exercise makes your back pain worse, dont do it.  1. Lie on your back with your knees bent and both feet on the ground.  2. Slowly raise your left knee to your chest as you flatten your lower back against the floor. Hold for 5 seconds.  3. Relax and repeat the exercise with your right knee.  4. Do 10 of these exercises for each leg.  Safe Lifting Method    Dont bend over at the waist to lift an object off the floor. Instead, bend your knees and hips in a squat.   Keep your back and head upright.   Hold the object close to your body, directly in front of you.   Straighten your legs to lift the object.   Lower the object to the floor in the reverse fashion.   If you must slide something across the floor, push it.  Posture Tips   SITTING  Sit in chairs with straight backs or low-back support. Keep your knees a little higher than your hips. If necessary, use a low stool to prop your feet on, so your feet are resting on a solid surface.  When driving, sit up straight. Adjust the seat forward so you are not leaning toward the steering wheel. A small pillow or rolled towel behind your lower back may help if you are driving long distances.  STANDING  When standing for long periods, shift most of your weight to one leg at a time. Alternate legs every few minutes.  SLEEPING  The best way to sleep is on your side with your knees bent. Put a low pillow under your head to support your neck in a neutral spine position. Avoid thick pillows that bend your neck to one side. Put a pillow between your legs to further relax your lower back. If you sleep on your back, put pillows under your knees to support your legs in a slightly flexed position. Use a firm mattress. If your mattress sags, replace it, or use a 1/2-inch plywood board under the mattress to add support.  Follow Up with your doctor or as directed by our staff.  [NOTE: If X-rays, a CT scan or an MRI scan were taken,  they will be reviewed by a radiologist. You will be notified of any new findings that may affect your care.]  Return Promptly or contact your doctor if any of the following occur:   Pain becomes worse or spreads to your arms or legs   Weakness or numbness in one or both arms or legs   Loss of bowel or bladder control   Numbness in the groin area   2000-2015 [employer] [address] [place], [place] [address]. All rights reserved. This information is not intended as a substitute for professional medical care. Always follow your healthcare professional's instructions.      Exercises To Strengthen Your Lower Back  Strong lower-back and abdominal muscles work together to support your spine. These exercises will help strengthen the muscles of the lower back. It is important that you begin exercising slowly and increase levels gradually.  Always begin any exercise program with stretching. If you feel pain while doing any of these exercises, stop and talk to your doctor about a more specific exercise program that suits your condition better.  Low Back Stretch   Lie on your back with your knees bent and both feet on the ground.    Slowly raise your left knee to your chest as you flatten your lower back against the floor. Hold for 5 seconds.   Relax and repeat the exercise with your right knee.   Do 10 of these exercises for each leg.   Repeat hugging both knees to your chest at the same time.  Building Lower Back Strength  5. Kneeling Lumbar Extension: Begin on your hands and knees. Simultaneously raise and straighten your right arm and left leg until they are parallel to the ground. Hold for 2 seconds and come back slowly to a starting position. Repeat with left arm and right leg, alternating 10 times.  6. Prone Lumbar Extension: Lie face down, arms extended overhead, palms on the floor. Simultaneously raise your right arm and left leg as high as comfortably possible. Hold for 10 seconds and  slowly return to start. Repeat with left arm and right leg, alternating 10 times. Gradually build up to 20 times. (Advanced: Repeat this exercise raising both arms and both legs a few inches off the floor at the same time. Hold for 5 seconds and release.)  7. Pelvic Tilt: Lie on the floor on your back with your knees bent at 90 degrees. Your feet should be flat on the floor. Inhale, exhale, then slowly contract your abdominal muscles bringing your navel toward your spine. Let your pelvis rock back until your lower back is flat on the floor. Hold for  10 seconds while breathing smoothly.  8. Abdominal Crunch: Perform a Pelvic Tilt (above) flattening your lower back against the floor. Holding the tension in your abdominal muscles, take another breath and raise your shoulder blades off the ground (this is not a full sit-up). Keep your head in line with your body (dont bend your neck forward). Hold for 2 seconds, then slowly lower.   [address] [employer] [address] [place], [place] [address]. All rights reserved. This information is not intended as a substitute for professional medical care. Always follow your healthcare professional's instructions.    We make every attempt to respond to your calls and emails within 72 hours.  Please note that this is only during business hours.  If you have a question or a problem that needs attention sooner, and it is after hours please call the physician on-call at [phone], these services begin after 5PM on weekdays.  If you have an emergency, call 911.            Requested Prescriptions     Signed Prescriptions Disp Refills    ibuprofen (ADVIL) 800 MG tablet 30 tablet 1     Sig: Take 1 tablet (800 mg total) by mouth every 8 (eight) hours as needed for Pain        Patient was counseled on possible medicine side effects which may include rash, swelling and/or stomach upset.  Patient was instructed to notify me or the ER if they experience problems.    No  orders of the defined types were placed in this encounter.       Before leaving the office, the patient was informed of all ordered diagnostic tests and consults.     If you have not heard back from [place] about test results in 14 days, please call the office at [phone].    Signed by: Kathi Simpers, NP    Supervising Doctor: Rogelio Seen, MD    The patient's electronic medical record was reviewed, any changes in the past medical history, past surgical history, medications, diagnostic tests were noted, and the record was updated accordingly.     Discussed option available for my chart which provides electronic access to diagnostic results.

## 2018-12-03 NOTE — Patient Instructions (Signed)
Low Back Pain:   Nothing on exam to suggest red flag warning symptom or sign.  Felt to be functional / biomechanical Low back pain paralumbar spasms. If you notice that you start having numbness and tinging that is in your perineal area (area that would touch if you was sitting on a saddle) and/or loose function of your bowel or bladder. Go to the emergency department.    Pt instructed in self back care including:   Icy/hot to back muscles and/or alternating ice & heat every few hours as needed for pain.     Tylenol or Ibuprofen as directed.    Make sure you are using good posture when sitting and sleeping to ensure comfort.     Home exercises prescribed and reviewed along with gentle ROM stretches. (see below)    If pain continues we will consider PT.    IF pain persists greater than 2-3 wks, please make an appointment to be re-evaluated.      Back Care Tips    These are things you can do to prevent a recurrence of acute back pain and to reduce symptoms from chronic back pain:   Maintain a healthy weight. If you are overweight, losing weight will help most types of back pain.   Exercise is an important part of recovery from most types of back pain. The back is supported by the muscles behind and in front of the spine. This means both the back muscles and the abdominal muscles must be strengthened to provide better support for your spine.   Swimming and brisk walking are good overall exercises to improve your fitness level.   Practice safe lifting methods (below).   Practice good posture when sitting, standing and walking. Avoid prolonged sitting. This puts more stress on the lower back than standing or walking.   Wear quality shoes with sufficient arch support. Foot and ankle alignment can affect back symptoms. Women should avoid high heels.   Therapeutic massage can help relieve acute and chronic back pain.   During the first two days after an acute injury or flare-up of chronic back pain, apply anice  packto the painful area for 20 minutes every 2-4 hours. This will reduce swelling and pain. Heat (hot shower, hot bath, or heating pad) works well for muscle spasm. You can start with ice, then switch to heat after two days. Some patients feel best alternating ice and heat treatments. Use the one method that feels the best to you.   You may use acetaminophen (Tylenol) or ibuprofen (Motrin, Advil) to control pain, unless another medicine was prescribed. [NOTE: If you have chronic liver or kidney disease or ever had a stomach ulcer or GI bleeding, talk with your doctor before using these medicines.]  Lumbar Stretch   Here is a simple stretching exercise that will help relax muscle spasm and keep your back more limber. If exercise makes your back pain worse, dont do it.  1. Lie on your back with your knees bent and both feet on the ground.  2. Slowly raise your left knee to your chest as you flatten your lower back against the floor. Hold for 5 seconds.  3. Relax and repeat the exercise with your right knee.  4. Do 10 of these exercises for each leg.  Safe Lifting Method    Dont bend over at the waist to lift an object off the floor. Instead, bend your knees and hips in a squat.   Keep your back and  head upright.   Hold the object close to your body, directly in front of you.   Straighten your legs to lift the object.   Lower the object to the floor in the reverse fashion.   If you must slide something across the floor, push it.  Posture Tips   SITTING  Sit in chairs with straight backs or low-back support. Keep your knees a little higher than your hips. If necessary, use a low stool to prop your feet on, so your feet are resting on a solid surface.  When driving, sit up straight. Adjust the seat forward so you are not leaning toward the steering wheel. A small pillow or rolled towel behind your lower back may help if you are driving long distances.  STANDING  When standing for long periods, shift  most of your weight to one leg at a time. Alternate legs every few minutes.  SLEEPING  The best way to sleep is on your side with your knees bent. Put a low pillow under your head to support your neck in a neutral spine position. Avoid thick pillows that bend your neck to one side. Put a pillow between your legs to further relax your lower back. If you sleep on your back, put pillows under your knees to support your legs in a slightly flexed position. Use a firm mattress. If your mattress sags, replace it, or use a 1/2-inch plywood board under the mattress to add support.  Follow Up with your doctor or as directed by our staff.  [NOTE: If X-rays, a CT scan or an MRI scan were taken, they will be reviewed by a radiologist. You will be notified of any new findings that may affect your care.]  Return Promptly or contact your doctor if any of the following occur:   Pain becomes worse or spreads to your arms or legs   Weakness or numbness in one or both arms or legs   Loss of bowel or bladder control   Numbness in the groin area   2000-2015 [employer] [address], [place], [place] [address]. All rights reserved. This information is not intended as a substitute for professional medical care. Always follow your healthcare professional's instructions.      Exercises To Strengthen Your Lower Back  Strong lower-back and abdominal muscles work together to support your spine. These exercises will help strengthen the muscles of the lower back. It is important that you begin exercising slowly and increase levels gradually.  Always begin any exercise program with stretching. If you feel pain while doing any of these exercises, stop and talk to your doctor about a more specific exercise program that suits your condition better.  Low Back Stretch   Lie on your back with your knees bent and both feet on the ground.    Slowly raise your left knee to your chest as you flatten your lower back against the  floor. Hold for 5 seconds.   Relax and repeat the exercise with your right knee.   Do 10 of these exercises for each leg.   Repeat hugging both knees to your chest at the same time.  Building Lower Back Strength  5. Kneeling Lumbar Extension: Begin on your hands and knees. Simultaneously raise and straighten your right arm and left leg until they are parallel to the ground. Hold for 2 seconds and come back slowly to a starting position. Repeat with left arm and right leg, alternating 10 times.  6. Prone Lumbar Extension: Lie face down, arms extended overhead, palms on the floor. Simultaneously raise your right arm and left leg as high as comfortably possible. Hold for 10 seconds and slowly return to start. Repeat with left arm and right leg, alternating 10 times. Gradually build up to 20 times. (Advanced: Repeat this exercise raising both arms and both legs a few inches off the floor at the same time. Hold for 5 seconds and release.)  7. Pelvic Tilt: Lie on the floor on your back with your knees bent at 90 degrees. Your feet should be flat on the floor. Inhale, exhale, then slowly contract your abdominal muscles bringing your navel toward your spine. Let your pelvis rock back until your lower back is flat on the floor. Hold for 10 seconds while breathing smoothly.  8. Abdominal Crunch: Perform a Pelvic Tilt (above) flattening your lower back against the floor. Holding the tension in your abdominal muscles, take another breath and raise your shoulder blades off the ground (this is not a full sit-up). Keep your head in line with your body (dont bend your neck forward). Hold for 2 seconds, then slowly lower.   [address] [employer] [address], [place], [place] [address]. All rights reserved. This information is not intended as a substitute for professional medical care. Always follow your healthcare professional's instructions.    We make every attempt to respond to your calls and emails within  72 hours.  Please note that this is only during business hours.  If you have a question or a problem that needs attention sooner, and it is after hours please call the physician on-call at [phone], these services begin after 5PM on weekdays.  If you have an emergency, call 911.

## 2018-12-03 NOTE — ED Triage Notes (Signed)
See initial note
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
## 2018-12-03 NOTE — Discharge Instructions (Signed)
Ileus     The digestive tract.   Ileus occurs when there is a problem with motility in the stomach and small or large intestine (bowel). Motility is the movement of food and waste through the digestive tract. Ileus is not caused by a physical blockage (obstruction).    Normally, muscles in the bowel walls squeeze (contract) to move waste along. Signals from nerves tell the muscles when to contract. With ileus, this movement slows down or stops completely. As a result, waste can’t move through the bowels and out of the body. This can cause belly (abdominal) pain and other symptoms. Treatment is needed to restore normal movement and ease symptoms.  Causes of ileus  Ileus can be caused by the following:  · Abdominal surgery  · Abdominal infection  · Injury to blood vessels that supply blood to the abdomen  · Low levels of sodium or potassium (electrolyte imbalance)  · Certain medicines, such as opioid pain medicines  · Certain kidney or lung diseases  · Certain health problems, such as cystic fibrosis and diabetes   Symptoms of ileus  Common symptoms of ileus include:  · Belly swelling or bloating  · Upset stomach (nausea) and vomiting  · Belly cramps  · Constipation or diarrhea   · Loss of appetite  · Not able to keep food down  · Not able to pass stool or gas  Diagnosing ileus  Your healthcare provider will ask about your symptoms and health history. You’ll also have a physical exam. If your provider thinks you may have ileus, tests may be done to confirm the problem. These can include:  · Imaging tests. These provide pictures of the bowels. Common tests include X-rays and a CT scan.  · Blood tests. These are done to check for infection and other problems, such as fluid loss (dehydration).  · Upper GI (gastrointestinal) series. This test takes X-rays of the upper digestive tract, from the mouth to the small intestine. An X-ray dye (contrast fluid) is used. The dye coats the inside of the upper digestive tract. This  makes it show up clearly on X-rays.  Treating ileus  In most cases, ileus goes away by itself when the main cause clears up. The goal is to manage symptoms until movement in the digestive tract returns to normal. Treatment takes place in a hospital. As part of your care, the following may be done:  · No food or drink is given by mouth. This allows your bowels to rest.  · An IV (intravenous) line is placed in a vein in your arm or hand. The IV line is used to give fluids and nutrition. It may also be used to give medicines. These may be needed to improve movement in your digestive tract, or to ease pain. They may also be needed to treat any underlying infections or conditions you have.  · A soft, thin, flexible tube (nasogastric tube) is inserted through your nose and into your stomach. The tube is used to remove extra gas and fluid in your stomach and bowels. This helps to ease symptoms such as pain and swelling.  · You’ll be watched closely in the hospital until your symptoms get better. Your provider will tell you when you’re well enough to go home. This is often in a few days.  · In rare cases, problems may occur. Other treatments, such as surgery, may then be done. Your provider will tell you more about other treatments, if needed.  Long-term concerns   After treatment, most people fully recover. In some cases, you   may need to see your provider for a follow-up appointment.  When to call the healthcare provider  Call your healthcare provider right away if you have any of the following:  · Fever of 100.4°F (38°C) or higher, or as advised by your provider  · Chills  · Belly swelling or pain that won’t go away  · Not able to pass stool or gas  · Upset stomach and vomiting  · Getting full very easily with only small amounts of food or drink  · Bleeding from the rectum  · Black, tarry stool  [employer] last reviewed this educational content on 06/23/2017  © 2000-2020 [employer] [address],  [place] [address]. All rights reserved. This information is not intended as a substitute for professional medical care. Always follow your healthcare professional's instructions.

## 2018-12-04 ENCOUNTER — Telehealth (RURAL_HEALTH_CENTER): Payer: Self-pay

## 2018-12-05 ENCOUNTER — Ambulatory Visit: Payer: Medicare Other | Attending: Nurse Practitioner | Admitting: Nurse Practitioner

## 2018-12-05 ENCOUNTER — Encounter (RURAL_HEALTH_CENTER): Payer: Self-pay | Admitting: Nurse Practitioner

## 2018-12-05 ENCOUNTER — Emergency Department: Payer: Medicare Other

## 2018-12-05 ENCOUNTER — Inpatient Hospital Stay: Payer: Medicare Other

## 2018-12-05 ENCOUNTER — Inpatient Hospital Stay
Admission: EM | Admit: 2018-12-05 | Discharge: 2018-12-09 | DRG: 389 | Disposition: A | Discharge status: Home / Self Care | Payer: Medicare Other | Source: Ambulatory Visit | Attending: Internal Medicine | Admitting: Internal Medicine

## 2018-12-05 VITALS — BP 138/75 | HR 92 | Temp 96.0°F | Resp 18 | Ht 65.0 in | Wt 142.0 lb

## 2018-12-05 DIAGNOSIS — F17210 Nicotine dependence, cigarettes, uncomplicated: Secondary | ICD-10-CM | POA: Diagnosis present

## 2018-12-05 DIAGNOSIS — K219 Gastro-esophageal reflux disease without esophagitis: Secondary | ICD-10-CM | POA: Diagnosis present

## 2018-12-05 DIAGNOSIS — Z833 Family history of diabetes mellitus: Secondary | ICD-10-CM

## 2018-12-05 DIAGNOSIS — J302 Other seasonal allergic rhinitis: Secondary | ICD-10-CM | POA: Diagnosis present

## 2018-12-05 DIAGNOSIS — K912 Postsurgical malabsorption, not elsewhere classified: Secondary | ICD-10-CM | POA: Diagnosis present

## 2018-12-05 DIAGNOSIS — F411 Generalized anxiety disorder: Secondary | ICD-10-CM | POA: Diagnosis present

## 2018-12-05 DIAGNOSIS — Z888 Allergy status to other drugs, medicaments and biological substances status: Secondary | ICD-10-CM

## 2018-12-05 DIAGNOSIS — Z806 Family history of leukemia: Secondary | ICD-10-CM

## 2018-12-05 DIAGNOSIS — Z7951 Long term (current) use of inhaled steroids: Secondary | ICD-10-CM

## 2018-12-05 DIAGNOSIS — Z87892 Personal history of anaphylaxis: Secondary | ICD-10-CM

## 2018-12-05 DIAGNOSIS — K56609 Unspecified intestinal obstruction, unspecified as to partial versus complete obstruction: Principal | ICD-10-CM | POA: Diagnosis present

## 2018-12-05 DIAGNOSIS — Z8379 Family history of other diseases of the digestive system: Secondary | ICD-10-CM

## 2018-12-05 DIAGNOSIS — Z9049 Acquired absence of other specified parts of digestive tract: Secondary | ICD-10-CM

## 2018-12-05 DIAGNOSIS — Z9079 Acquired absence of other genital organ(s): Secondary | ICD-10-CM

## 2018-12-05 DIAGNOSIS — M797 Fibromyalgia: Secondary | ICD-10-CM | POA: Diagnosis present

## 2018-12-05 DIAGNOSIS — Z885 Allergy status to narcotic agent status: Secondary | ICD-10-CM

## 2018-12-05 DIAGNOSIS — Z881 Allergy status to other antibiotic agents status: Secondary | ICD-10-CM

## 2018-12-05 DIAGNOSIS — Z83438 Family history of other disorder of lipoprotein metabolism and other lipidemia: Secondary | ICD-10-CM

## 2018-12-05 DIAGNOSIS — F319 Bipolar disorder, unspecified: Secondary | ICD-10-CM | POA: Diagnosis present

## 2018-12-05 DIAGNOSIS — Z8249 Family history of ischemic heart disease and other diseases of the circulatory system: Secondary | ICD-10-CM

## 2018-12-05 DIAGNOSIS — Z90721 Acquired absence of ovaries, unilateral: Secondary | ICD-10-CM

## 2018-12-05 DIAGNOSIS — G4700 Insomnia, unspecified: Secondary | ICD-10-CM | POA: Diagnosis present

## 2018-12-05 DIAGNOSIS — B182 Chronic viral hepatitis C: Secondary | ICD-10-CM | POA: Diagnosis present

## 2018-12-05 DIAGNOSIS — Z79899 Other long term (current) drug therapy: Secondary | ICD-10-CM

## 2018-12-05 DIAGNOSIS — Z90710 Acquired absence of both cervix and uterus: Secondary | ICD-10-CM

## 2018-12-05 DIAGNOSIS — K567 Ileus, unspecified: Secondary | ICD-10-CM

## 2018-12-05 DIAGNOSIS — J449 Chronic obstructive pulmonary disease, unspecified: Secondary | ICD-10-CM | POA: Diagnosis present

## 2018-12-05 LAB — COMPREHENSIVE METABOLIC PANEL
ALT: 13 U/L (ref 0–55)
AST (SGOT): 21 U/L (ref 10–42)
Albumin/Globulin Ratio: 1.12 Ratio (ref 0.80–2.00)
Albumin: 3.8 gm/dL (ref 3.5–5.0)
Alkaline Phosphatase: 107 U/L (ref 40–145)
Anion Gap: 16.8 mMol/L (ref 7.0–18.0)
BUN / Creatinine Ratio: 7.8 Ratio — ABNORMAL LOW (ref 10.0–30.0)
BUN: 6 mg/dL — ABNORMAL LOW (ref 7–22)
Bilirubin, Total: 0.2 mg/dL (ref 0.1–1.2)
CO2: 17.4 mMol/L — ABNORMAL LOW (ref 20.0–30.0)
Calcium: 8.4 mg/dL — ABNORMAL LOW (ref 8.5–10.5)
Chloride: 112 mMol/L — ABNORMAL HIGH (ref 98–110)
Creatinine: 0.77 mg/dL (ref 0.60–1.20)
EGFR: 102 mL/min/{1.73_m2} (ref 60–150)
Globulin: 3.4 gm/dL (ref 2.0–4.0)
Glucose: 92 mg/dL (ref 71–99)
Osmolality Calculated: 280 mOsm/kg (ref 275–300)
Potassium: 4.2 mMol/L (ref 3.5–5.3)
Protein, Total: 7.2 gm/dL (ref 6.0–8.3)
Sodium: 142 mMol/L (ref 136–147)

## 2018-12-05 LAB — CBC AND DIFFERENTIAL
Basophils %: 1.9 % (ref 0.0–3.0)
Basophils Absolute: 0.2 10*3/uL (ref 0.0–0.3)
Eosinophils %: 2.5 % (ref 0.0–7.0)
Eosinophils Absolute: 0.3 10*3/uL (ref 0.0–0.8)
Hematocrit: 42.4 % (ref 36.0–48.0)
Hemoglobin: 14.8 gm/dL (ref 12.0–16.0)
Lymphocytes Absolute: 2.9 10*3/uL (ref 0.6–5.1)
Lymphocytes: 25.3 % (ref 15.0–46.0)
MCH: 34 pg (ref 28–35)
MCHC: 35 gm/dL (ref 31–36)
MCV: 97 fL (ref 80–100)
MPV: 8 fL (ref 6.0–10.0)
Monocytes Absolute: 1.1 10*3/uL (ref 0.1–1.7)
Monocytes: 9.6 % (ref 3.0–15.0)
Neutrophils %: 60.7 % (ref 42.0–78.0)
Neutrophils Absolute: 6.9 10*3/uL (ref 1.7–8.6)
PLT CT: 287 10*3/uL (ref 130–440)
RBC: 4.36 10*6/uL (ref 3.80–5.00)
RDW: 11.4 % (ref 10.5–14.5)
WBC: 11.3 10*3/uL — ABNORMAL HIGH (ref 4.0–11.0)

## 2018-12-05 LAB — LIPASE: Lipase: 37 U/L (ref 8–78)

## 2018-12-05 MED ORDER — LORAZEPAM 2 MG/ML IJ SOLN
0.50 mg | Freq: Once | INTRAMUSCULAR | Status: AC
Start: 2018-12-06 — End: 2018-12-06
  Administered 2018-12-06: 0.5 mg via INTRAVENOUS
  Filled 2018-12-05: qty 1

## 2018-12-05 MED ORDER — SODIUM CHLORIDE 0.9 % IV BOLUS
1000.00 mL | Freq: Once | INTRAVENOUS | Status: AC
Start: 2018-12-05 — End: 2018-12-05
  Administered 2018-12-05: 17:00:00 1000 mL via INTRAVENOUS

## 2018-12-05 MED ORDER — VH HYDROMORPHONE HCL PF 1 MG/ML CARPUJECT
0.50 mg | INTRAMUSCULAR | Status: DC | PRN
Start: 2018-12-05 — End: 2018-12-06
  Administered 2018-12-05 – 2018-12-06 (×3): 0.5 mg via INTRAVENOUS
  Filled 2018-12-05 (×3): qty 1

## 2018-12-05 MED ORDER — ONDANSETRON HCL 4 MG/2ML IJ SOLN
INTRAMUSCULAR | Status: AC
Start: 2018-12-05 — End: ?
  Filled 2018-12-05: qty 2

## 2018-12-05 MED ORDER — NICOTINE 21 MG/24HR TD PT24
1.00 | MEDICATED_PATCH | Freq: Every day | TRANSDERMAL | Status: DC
Start: 2018-12-05 — End: 2018-12-09
  Administered 2018-12-05 – 2018-12-07 (×3): 1 via TRANSDERMAL
  Filled 2018-12-05 (×6): qty 1

## 2018-12-05 MED ORDER — BENZOCAINE 20% MT SOLN (WRAP)
1.00 | Freq: Once | OROMUCOSAL | Status: AC
Start: 2018-12-05 — End: 2018-12-05
  Administered 2018-12-05: 18:00:00 1 via OROMUCOSAL

## 2018-12-05 MED ORDER — LORAZEPAM 2 MG/ML IJ SOLN
0.50 mg | Freq: Once | INTRAMUSCULAR | Status: AC
Start: 2018-12-05 — End: 2018-12-05
  Administered 2018-12-05: 23:00:00 0.5 mg via INTRAVENOUS
  Filled 2018-12-05: qty 1

## 2018-12-05 MED ORDER — BENZOCAINE 20% MT SOLN (WRAP)
OROMUCOSAL | Status: AC
Start: 2018-12-05 — End: ?
  Filled 2018-12-05: qty 0.28

## 2018-12-05 MED ORDER — LIDOCAINE VISCOUS HCL 2 % MT SOLN
OROMUCOSAL | Status: AC
Start: 2018-12-05 — End: ?
  Filled 2018-12-05: qty 20

## 2018-12-05 MED ORDER — ALBUTEROL SULFATE (2.5 MG/3ML) 0.083% IN NEBU
2.50 mg | INHALATION_SOLUTION | Freq: Four times a day (QID) | RESPIRATORY_TRACT | Status: DC | PRN
Start: 2018-12-05 — End: 2018-12-09

## 2018-12-05 MED ORDER — SODIUM CHLORIDE (PF) 0.9 % IJ SOLN
3.00 mL | Freq: Three times a day (TID) | INTRAMUSCULAR | Status: DC
Start: 2018-12-05 — End: 2018-12-09
  Administered 2018-12-06 – 2018-12-09 (×4): 3 mL via INTRAVENOUS

## 2018-12-05 MED ORDER — SODIUM CHLORIDE (PF) 0.9 % IJ SOLN
0.40 mg | INTRAMUSCULAR | Status: DC | PRN
Start: 2018-12-05 — End: 2018-12-09

## 2018-12-05 MED ORDER — ENOXAPARIN SODIUM 40 MG/0.4ML SC SOLN
40.00 mg | SUBCUTANEOUS | Status: DC
Start: 2018-12-05 — End: 2018-12-09
  Filled 2018-12-05 (×6): qty 0.4

## 2018-12-05 MED ORDER — ONDANSETRON HCL 4 MG/2ML IJ SOLN
4.00 mg | Freq: Once | INTRAMUSCULAR | Status: AC
Start: 2018-12-05 — End: 2018-12-05
  Administered 2018-12-05: 17:00:00 4 mg via INTRAVENOUS

## 2018-12-05 MED ORDER — CLONAZEPAM 0.5 MG PO TABS
0.5000 mg | ORAL_TABLET | Freq: Two times a day (BID) | ORAL | Status: DC
Start: 2018-12-05 — End: 2018-12-09
  Administered 2018-12-05 – 2018-12-09 (×8): 0.5 mg via ORAL
  Filled 2018-12-05 (×8): qty 1

## 2018-12-05 MED ORDER — FLUTICASONE FUROATE-VILANTEROL 100-25 MCG/INH IN AEPB
1.00 | INHALATION_SPRAY | Freq: Every morning | RESPIRATORY_TRACT | Status: DC
Start: 2018-12-06 — End: 2018-12-09
  Administered 2018-12-06: 1 via RESPIRATORY_TRACT
  Filled 2018-12-05: qty 14

## 2018-12-05 MED ORDER — VH HYDROMORPHONE HCL PF 1 MG/ML CARPUJECT
INTRAMUSCULAR | Status: AC
Start: 2018-12-05 — End: ?
  Filled 2018-12-05: qty 1

## 2018-12-05 MED ORDER — VH HYDROMORPHONE HCL PF 1 MG/ML CARPUJECT
0.50 mg | Freq: Once | INTRAMUSCULAR | Status: AC
Start: 2018-12-05 — End: 2018-12-05
  Administered 2018-12-05: 17:00:00 0.5 mg via INTRAVENOUS

## 2018-12-05 MED ORDER — LIDOCAINE VISCOUS HCL 2 % MT SOLN
5.00 mL | Freq: Once | OROMUCOSAL | Status: AC
Start: 2018-12-05 — End: 2018-12-05
  Administered 2018-12-05: 18:00:00 5 mL via NASAL

## 2018-12-05 NOTE — Progress Notes (Signed)
PROGRESS NOTE      Patient Name: Paulus,[name]  Primary Care Physician: Kathi Simpers, NP      History of Presenting Illness:   Mersadie Emalina Proch is a 33 y.o. female who presents to the office with   Chief Complaint   Patient presents with    Hospital Follow-up     ileus- out of pain med      Abdominal Pain:  Patient reports that she has a history of short gut syndrome and has taken lomotil in the past for this along with Zofran for nausea. She reports that she needs to get established with a GI specialist but has not scheduled this appointment. She was seen in the Emergency Department at [facility] yesterday for abdominal pain and was found to have an illeus. It was recommended that she be admitted but she states she couldn't go to [place] to be admitted. Pain medication was prescribed and she requests a refill of this medication. Discussed how pain medication can cause someone to have an ileus and recommend that she be admitted for NG placement and treatment of the illeus. She is agreeable to this but would like to be admitted to [facility] vs [facility]. States there is no guarantee and it would be where there is provider coverage and bed availability.        Past Medical History:     Past Medical History:   Diagnosis Date    Anxiety     Bipolar 1 disorder     Chronic obstructive pulmonary disease     Fibromyalgia     Insomnia        Past Surgical History:     Past Surgical History:   Procedure Laterality Date    APPENDECTOMY      CHOLECYSTECTOMY      COLON RESECTION      COLON SURGERY      HYSTERECTOMY      salpingo-oophorectomy R side        Family History:     Family History   Problem Relation Age of Onset    Hypertension Mother     Diabetes Mother     Leukemia Father     Liver disease Maternal Grandfather     Hyperlipidemia Paternal Grandfather        Social History:     Social History     Tobacco Use   Smoking Status Current Every Day Smoker    Packs/day: 0.50    Types: Cigarettes    Smokeless Tobacco Never Used     Social History     Substance and Sexual Activity   Alcohol Use Never    Frequency: Never     Social History     Substance and Sexual Activity   Drug Use Not Currently    Comment: opiates -- last use 2013       Allergies:     Allergies   Allergen Reactions    Fentanyl Anaphylaxis    Reglan [Metoclopramide]     Rocephin [Ceftriaxone]     Tramadol     Vancomycin        Medications:     Prior to Admission medications    Medication Sig Start Date End Date Taking? Authorizing Provider   albuterol sulfate HFA (PROVENTIL) 108 (90 Base) MCG/ACT inhaler Inhale 2 puffs into the lungs every 4 (four) hours as needed for Wheezing or Shortness of Breath 10/17/18 10/17/19 Yes Marja Kays T, NP   clomiPHENE (CLOMID) 50 MG  tablet Take 50 mg by mouth Days 3-7   09/12/18  Yes [provider]   clonazePAM (KlonoPIN) 0.5 MG tablet Take 1 tablet (0.5 mg total) by mouth 2 (two) times daily This is to be taken over by psychiatry on Nov 25, 2018 10/17/18 10/17/19 Yes Marja Kays T, NP   fluticasone-salmeterol (ADVAIR HFA) 230-21 MCG/ACT inhaler Inhale 2 puffs into the lungs 2 (two) times daily 10/17/18  Yes Malaysha Arlen T, NP   ibuprofen (ADVIL) 600 MG tablet Take 1 tablet (600 mg total) by mouth every 6 (six) hours as needed for Pain 10/17/18  Yes Gareth Morgan, MD   pregabalin (LYRICA) 75 MG capsule Take 1 capsule (75 mg total) by mouth daily 10/17/18 10/17/19 Yes Dejanique Ruehl T, NP   QUEtiapine (SEROquel) 400 MG tablet Take 1 tablet (400 mg total) by mouth nightly 10/17/18  Yes Karlis Cregg T, NP   tiZANidine (ZANAFLEX) 4 MG tablet Take 2 tablets (8 mg total) by mouth every 8 (eight) hours as needed (fibromyalgia pain) 10/17/18  Yes Marja Kays T, NP   traZODone (DESYREL) 150 MG tablet Take 1 tablet (150 mg total) by mouth nightly 10/17/18  Yes Tirth Cothron T, NP   varenicline (Chantix Continuing Month Pak) 1 MG tablet Take 1 tablet (1 mg total) by mouth 2 (two) times daily 10/17/18   Yes Amulya Quintin T, NP   varenicline (Chantix Starting Month Pak) 0.5 MG X 11 & 1 MG X 42 tablet Take one 0.5mg  tablet by mouth once daily for 3 days, then increase to one 0.5mg  tablet twice daily for 3 days, then increase to one 1mg  tablet twice daily. 10/17/18  Yes Marja Kays T, NP   diphenoxylate-atropine (Lomotil) 2.5-0.025 MG per tablet Take 1 tablet by mouth 4 (four) times daily as needed for Diarrhea 10/22/18 10/22/19  Marja Kays T, NP   hydrOXYzine (ATARAX) 25 MG tablet Take 1-2 tabs PO TID PRN anxiety. 10/22/18   Hildred Priest, NP   Lurasidone HCl (Latuda) 40 MG Tab Take 1 tablet (40 mg total) by mouth daily 10/22/18   Hildred Priest, NP   ondansetron (Zofran) 4 MG tablet Take 1 tablet (4 mg total) by mouth daily as needed for Nausea 10/22/18 10/22/19  Marja Kays T, NP   traZODone (DESYREL) 150 MG tablet Take 1.5 -2 tabs PO PRN at HS. 10/22/18   Hildred Priest, NP       Review of Systems:      14 system review negative except as noted in the HPI.    Physical Exam:     Vitals:    12/05/18 1507   BP: 138/75   Pulse: 92   Resp: 18   Temp: (!) 96 F (35.6 C)   SpO2: 98%     Body mass index is 23.63 kg/m.    General:  no acute distress. Pleasant and well groomed.  HEENT: eomi, sclera anicteric, normocephalic  Neck: supple, no lymphadenopathy, no thyromegaly, no JVD  Cardiovascular: regular rate and rhythm, no murmurs  Lungs: clear to auscultation bilaterally, without wheezing, rhonchi, or rales  Extremities: no edema  Musculoskeletal: Gait WNL. No swelling, redness of joints.  Abdomen: Tender, hypoactive bowel sounds.   Neuro:  alert and oriented.   Skin: no rashes or lesions noted  Psychiatric/Behavioral: appropriate affect, mood and behavior.      Assessment:     1. Ileus        Plan:   Patient Instructions  Illeus:  Patient to present to the emergency department via personal vehicle. Declined transportation via EMS transport.   ER notified of patient arrival.     We make every attempt to  respond to your calls and emails within 72 hours.  Please note that this is only during business hours.  If you have a question or a problem that needs attention sooner, and it is after hours please call the physician on-call at [phone], these services begin after 5PM on weekdays.  If you have an emergency, call 911.                Requested Prescriptions      No prescriptions requested or ordered in this encounter        Patient was counseled on possible medicine side effects which may include rash, swelling and/or stomach upset.  Patient was instructed to notify me or the ER if they experience problems.    No orders of the defined types were placed in this encounter.       Before leaving the office, the patient was informed of all ordered diagnostic tests and consults.     If you have not heard back from [place] about test results in 14 days, please call the office at [phone].    Signed by: Kathi Simpers, NP    Supervising Doctor: Rogelio Seen, MD    The patient's electronic medical record was reviewed, any changes in the past medical history, past surgical history, medications, diagnostic tests were noted, and the record was updated accordingly.     Discussed option available for my chart which provides electronic access to diagnostic results.

## 2018-12-05 NOTE — H&P (Addendum)
Medicine History & Physical   [facility]  [employer]   Patient Name: [name],[name] MARIE LOS: 0 days   Attending Physician: Sande Brothers, * PCP: Kathi Simpers, NP      Assessment and Plan:                                                              Ileus Vs SBO  H/o multiple recurrent ileus/bowel obstruction s/p partial colectomy and partial small bowel resection  -patient has already been admitted to Med/Surg  -NPO, IVF  -NGT has been placed in ED per Dr. Rondel Baton request (surgery)  -cont NGT at low intermittent suction.  Confirmed placement  -Pain control  -anti-tussive for nausea    H/o Short Gut Syndrome s/p partial colectomy.    S/p partial small bowel resection (per patient):  -chronic nausea  -manages multiple daily BMs/diarrhea with Lomotil 4 times at home-held 2/2 above    Non anion gap, hyperchloremic metabolic acidosis  -no prior labs for comparison but this can be 2/2 chronic HC03 loss 2/2 chronic diarrhea 2/2 short gut syndrome.  Patient states she has up to 12 BMs per day.  - Monitor.    H/o Fibromyalgia  -managed with prn Lyrica and  Zanaflex at home-on hold 2/2 illeus/ABO.     History of Hepatitis C:  -f/u with GI when discharged    Bipolar d/o  Generalized anxiety d/o  Insomnia  -no longer taking Latuda 40 mg, patient states it didn't work and stopped it ~ 1 month ago    -takes trazodone 150mg  at home, she requests Ativan while here-will give Ativan  -Cont Klonipin    Tobacco abuse, 1PPd  -counseled  -Nicotine patch    DVT PPx: Lovenox    Code: Full     History of Presenting Illness                                CC: abdominal pain and nuasea  [name] is a 33 y.o. female patient with PMH including but not limited to short gut syndrome, chronic hepatitis C, Crohn's disease, PCOS, [place] seizures, bipolar disorder, Fibromyalgia, opiate abuse age [age], none since 2013 s/p rehab, Cholecystectomy, Appendectomy, Hysterectomy, colectomy  and h/o of multiple bowel obstructions.  Patient returns to [facility] ED today with worsening right sided abdominal pain, intermittent, and increasing nausea worse than her chronic nausea.  Onset of achy right sided abdominal pain ~6 days, worse with food and has not eaten now for x2 days.  Patient usually has numerous BMs (diarrhea upto 12/day) but no BMs x2 days and reports not passing gas as well.  Denies F/c, cough, SOB, chest pain or dizziness.  Patient was seen at [facility] ED on 12/03/18 for similar issue and CT abd/pelvis with contrast showed Ileus with nonspecific malabsorptive process. She was offered to be admitted but she had declined.  No illicit drug or ETOH abuse, + tobacco 1ppd  12/05/18 AXR: Diffuse gaseous distention of the bowel consistent with either distal colon obstruction or ileus.    Past Medical History:   Diagnosis Date    Anxiety     Bipolar 1 disorder     Chronic obstructive pulmonary disease  Fibromyalgia     Gastroesophageal reflux disease     Insomnia     Seasonal allergic rhinitis      Past Surgical History:   Procedure Laterality Date    APPENDECTOMY      CHOLECYSTECTOMY      COLON RESECTION      COLON SURGERY      HYSTERECTOMY      salpingo-oophorectomy R side        Family History   Problem Relation Age of Onset    Hypertension Mother     Diabetes Mother     Leukemia Father     Liver disease Maternal Grandfather     Hyperlipidemia Paternal Grandfather      Social History     Tobacco Use    Smoking status: Current Every Day Smoker     Packs/day: 1.00     Types: Cigarettes    Smokeless tobacco: Never Used   Substance Use Topics    Alcohol use: Never     Frequency: Never    Drug use: Not Currently     Comment: opiates -- last use 2013       Subjective   Review of Systems:  + anxiety and panic attacks.  No suicidal or homicidal ideation or plans.  + chronic nausea.  Review of systems as noted in HPI. Full 12 point ROS otherwise negative.         Objective   Physical Exam:      Vitals: T:98.6 F (37 C) (Tympanic),  BP:136/86, HR:63, RR:18, SaO2:100%    1) General Appearance: Alert and oriented x 4. In no acute distress.   2) Eyes: Pink conjunctiva, anicteric sclera. Pupils are equally reactive to light.  3) ENT: Oral mucosa moist with no pharyngeal congestion, erythema or swelling.  4) Neck: Supple, with full range of motion. Trachea is central, no JVD noted  5) Chest: Clear to auscultation bilaterally, no wheezes or rhonchi.  6) CVS: normal rate and regular rhythm, with no murmurs.  7) Abdomen: Soft, + tenderness right side of abdomen and mid to lower abd quadrants, no palpable mass. Bowel sounds hyperactive. No CVA tenderness  8) Extremities: No pitting edema, pulses palpable, no calf swelling and no gross deformity.  9) Skin: Warm, dry with normal skin turgor, no rash  10) Lymphatics: No lymphadenopathy in axillary, cervical and inguinal area.   11) Neurological: Cranial nerves II-XII intact. No gross focal motor or sensory deficits noted.  12) Psychiatric: Affect is appropriate. No hallucinations.        Patient Vitals for the past 12 hrs:   BP Temp Pulse Resp   12/05/18 1839 136/86 98.6 F (37 C) 63 18   12/05/18 1814 132/84      12/05/18 1744 127/86      12/05/18 1710 124/77      12/05/18 1645 121/79 98.1 F (36.7 C) 75 18     Weight Monitoring 10/22/2018 11/15/2018 12/03/2018 12/03/2018 12/05/2018 12/05/2018 12/05/2018   Height 165.1 cm 165.1 cm 165.1 cm 165.1 cm 165.1 cm 165.1 cm 165.1 cm   Height Method - - - Stated - Stated Stated   Weight 63.957 kg 64.774 kg 63.05 kg 64.1 kg 64.411 kg 62.596 kg 65.1 kg   Weight Method - - - Actual - Stated Bed Scale   BMI (calculated) 23.5 kg/m2 23.8 kg/m2 23.2 kg/m2 23.6 kg/m2 23.7 kg/m2 23 kg/m2 23.9 kg/m2          Recent Results (from the past  24 hour(s))   CBC and differential    Collection Time: 12/05/18  5:07 PM   Result Value Ref Range    WBC 11.3 (H) 4.0 - 11.0 K/cmm    RBC 4.36 3.80 - 5.00 M/cmm    Hemoglobin 14.8 12.0 -  16.0 gm/dL    Hematocrit 16.1 09.6 - 48.0 %    MCV 97 80 - 100 fL    MCH 34 28 - 35 pg    MCHC 35 31 - 36 gm/dL    RDW 04.5 40.9 - 81.1 %    PLT CT 287 130 - 440 K/cmm    MPV 8.0 6.0 - 10.0 fL    Neutrophils % 60.7 42.0 - 78.0 %    Lymphocytes 25.3 15.0 - 46.0 %    Monocytes 9.6 3.0 - 15.0 %    Eosinophils % 2.5 0.0 - 7.0 %    Basophils % 1.9 0.0 - 3.0 %    Neutrophils Absolute 6.9 1.7 - 8.6 K/cmm    Lymphocytes Absolute 2.9 0.6 - 5.1 K/cmm    Monocytes Absolute 1.1 0.1 - 1.7 K/cmm    Eosinophils Absolute 0.3 0.0 - 0.8 K/cmm    Basophils Absolute 0.2 0.0 - 0.3 K/cmm   Comprehensive metabolic panel    Collection Time: 12/05/18  5:07 PM   Result Value Ref Range    Sodium 142 136 - 147 mMol/L    Potassium 4.2 3.5 - 5.3 mMol/L    Chloride 112 (H) 98 - 110 mMol/L    CO2 17.4 (L) 20.0 - 30.0 mMol/L    Calcium 8.4 (L) 8.5 - 10.5 mg/dL    Glucose 92 71 - 99 mg/dL    Creatinine 9.14 7.82 - 1.20 mg/dL    BUN 6 (L) 7 - 22 mg/dL    Protein, Total 7.2 6.0 - 8.3 gm/dL    Albumin 3.8 3.5 - 5.0 gm/dL    Alkaline Phosphatase 107 40 - 145 U/L    ALT 13 0 - 55 U/L    AST (SGOT) 21 10 - 42 U/L    Bilirubin, Total 0.2 0.1 - 1.2 mg/dL    Albumin/Globulin Ratio 1.12 0.80 - 2.00 Ratio    Anion Gap 16.8 7.0 - 18.0 mMol/L    BUN / Creatinine Ratio 7.8 (L) 10.0 - 30.0 Ratio    EGFR 102 60 - 150 mL/min/1.58m2    Osmolality Calculated 280 275 - 300 mOsm/kg    Globulin 3.4 2.0 - 4.0 gm/dL   Lipase    Collection Time: 12/05/18  5:07 PM   Result Value Ref Range    Lipase 37 8 - 78 U/L        Allergies   Allergen Reactions    Fentanyl Anaphylaxis    Reglan [Metoclopramide]     Rocephin [Ceftriaxone]     Tramadol     Vancomycin       Ct Abdomen Pelvis W Iv And Po Cont    Result Date: 12/03/2018  Ileus with nonspecific malabsorptive process. Recommendations: None. ReadingStation:[id number]    Xr Abdomen 2 View With Cxr    Result Date: 12/05/2018  Diffuse gaseous distention of the bowel consistent with either distal colon obstruction or ileus.  ReadingStation:[email or web address]     Home Medications     Med List Status: In Progress Set By: Ester Rink, RN at 12/05/2018  4:51 PM                albuterol sulfate HFA (PROVENTIL) 108 (  90 Base) MCG/ACT inhaler     Inhale 2 puffs into the lungs every 4 (four) hours as needed for Wheezing or Shortness of Breath     clonazePAM (KlonoPIN) 0.5 MG tablet     Take 1 tablet (0.5 mg total) by mouth 2 (two) times daily This is to be taken over by psychiatry on Nov 25, 2018     diphenoxylate-atropine (Lomotil) 2.5-0.025 MG per tablet     Take 2 tablets by mouth 4 (four) times daily     fluticasone-salmeterol (ADVAIR HFA) 230-21 MCG/ACT inhaler     Inhale 2 puffs into the lungs 2 (two) times daily     HYDROcodone-acetaminophen (NORCO) 5-325 MG per tablet     Take 1 tablet by mouth every 6 (six) hours as needed for Pain     ibuprofen (ADVIL) 800 MG tablet     Take 1 tablet (800 mg total) by mouth every 8 (eight) hours as needed for Pain     ondansetron (Zofran ODT) 4 MG disintegrating tablet     Take 1 tablet (4 mg total) by mouth every 8 (eight) hours as needed for Nausea     pregabalin (LYRICA) 100 MG capsule     Take 1 capsule (100 mg total) by mouth 2 (two) times daily     tiZANidine (ZANAFLEX) 4 MG tablet     Take 2 tablets (8 mg total) by mouth every 8 (eight) hours as needed (fibromyalgia pain)     traZODone (DESYREL) 150 MG tablet     Take 1.5 -2 tabs PO PRN at HS.         Meds given in the ED:  Medications   albuterol (PROVENTIL) (2.5 MG/3ML) 0.083% nebulizer solution 2.5 mg (has no administration in time range)   clonazePAM (KlonoPIN) tablet 0.5 mg (has no administration in time range)   fluticasone furoate-vilanterol (BREO ELLIPTA) 100-25 MCG/INH 1 puff (has no administration in time range)   sodium chloride (PF) 0.9 % injection 3 mL (has no administration in time range)   naloxone (NARCAN) injection 0.4 mg (has no administration in time range)   enoxaparin (LOVENOX) syringe 40 mg (has no administration in time range)    sodium chloride 0.9 % bolus 1,000 mL (1,000 mLs Intravenous New Bag 12/05/18 1722)   ondansetron (ZOFRAN) injection 4 mg (4 mg Intravenous Given 12/05/18 1724)   HYDROmorphone (DILAUDID) injection 0.5 mg (0.5 mg Intravenous Given 12/05/18 1724)   benzocaine (HURRICAINE) 20 % mouth spray 1 spray (1 spray Mouth/Throat Given 12/05/18 1755)   lidocaine viscous (XYLOCAINE) 2 % solution 5 mL (5 mLs Each Nare Given 12/05/18 1755)   ondansetron (ZOFRAN) 4 MG/2ML injection (has no administration in time range)   HYDROmorphone (DILAUDID) 1 MG/ML injection (has no administration in time range)   benzocaine (HURRICAINE) 20 % mouth spray (has no administration in time range)   lidocaine viscous (XYLOCAINE) 2 % solution (has no administration in time range)      Time Spent:     Jacqulynn Cadet, MD     12/05/18,7:39 PM   MRN: [id number]                                      CSN: [id number] DOB: [birth year]

## 2018-12-05 NOTE — ED Notes (Signed)
[facility] running transport.

## 2018-12-05 NOTE — ED Notes (Addendum)
Pt is going to room # 216 per nursing supervisor, registration made aware of room assignment.

## 2018-12-05 NOTE — ED Provider Notes (Signed)
History     Chief Complaint   Patient presents with    Abdominal Pain     33 years old female presents with complaints of continuing abdominal pain and distention and severe nausea without vomiting.  She did not pass any stool for 2 days.  She is not passing any flatus as well.  She was seen here day before yesterday with similar complaint and evaluation in the ED showed multiple air-fluid levels with prominent a small bowel loops with maximum diameter of 3 cm.  She was advised to be admitted for NG tube as well as IV fluid but she declined admission because there was no bed in this hospital and that she needed to be transferred up to [facility].  She returns today with worsening symptoms and requesting to stay here in this hospital rather than being transferred but agrees to be transferred if needed.    The history is provided by the patient. No language interpreter was used.        Past Medical History:   Diagnosis Date    Anxiety     Bipolar 1 disorder     Chronic obstructive pulmonary disease     Fibromyalgia     Insomnia        Past Surgical History:   Procedure Laterality Date    APPENDECTOMY      CHOLECYSTECTOMY      COLON RESECTION      COLON SURGERY      HYSTERECTOMY      salpingo-oophorectomy R side        Family History   Problem Relation Age of Onset    Hypertension Mother     Diabetes Mother     Leukemia Father     Liver disease Maternal Grandfather     Hyperlipidemia Paternal Grandfather        Social  Social History     Tobacco Use    Smoking status: Current Every Day Smoker     Packs/day: 0.50     Types: Cigarettes    Smokeless tobacco: Never Used   Substance Use Topics    Alcohol use: Never     Frequency: Never    Drug use: Not Currently     Comment: opiates -- last use 2013       .     Allergies   Allergen Reactions    Fentanyl Anaphylaxis    Reglan [Metoclopramide]     Rocephin [Ceftriaxone]     Tramadol     Vancomycin        Home Medications     Med List Status: In  Progress Set By: Ester Rink, RN at 12/05/2018  4:51 PM                albuterol sulfate HFA (PROVENTIL) 108 (90 Base) MCG/ACT inhaler     Inhale 2 puffs into the lungs every 4 (four) hours as needed for Wheezing or Shortness of Breath     clonazePAM (KlonoPIN) 0.5 MG tablet     Take 1 tablet (0.5 mg total) by mouth 2 (two) times daily This is to be taken over by psychiatry on Nov 25, 2018     diphenoxylate-atropine (Lomotil) 2.5-0.025 MG per tablet     Take 2 tablets by mouth 4 (four) times daily     fluticasone-salmeterol (ADVAIR HFA) 230-21 MCG/ACT inhaler     Inhale 2 puffs into the lungs 2 (two) times daily     HYDROcodone-acetaminophen (NORCO) 5-325  MG per tablet     Take 1 tablet by mouth every 6 (six) hours as needed for Pain     ibuprofen (ADVIL) 800 MG tablet     Take 1 tablet (800 mg total) by mouth every 8 (eight) hours as needed for Pain     ondansetron (Zofran ODT) 4 MG disintegrating tablet     Take 1 tablet (4 mg total) by mouth every 8 (eight) hours as needed for Nausea     pregabalin (LYRICA) 100 MG capsule     Take 1 capsule (100 mg total) by mouth 2 (two) times daily     tiZANidine (ZANAFLEX) 4 MG tablet     Take 2 tablets (8 mg total) by mouth every 8 (eight) hours as needed (fibromyalgia pain)     traZODone (DESYREL) 150 MG tablet     Take 1.5 -2 tabs PO PRN at HS.           Review of Systems   Constitutional: Negative for chills, diaphoresis and fatigue.   HENT: Negative for congestion, ear pain, rhinorrhea and sore throat.    Eyes: Negative for photophobia and pain.   Respiratory: Negative for cough, chest tightness and shortness of breath.    Cardiovascular: Negative for chest pain, palpitations and leg swelling.   Gastrointestinal: Negative for abdominal distention, abdominal pain, constipation, diarrhea, nausea and vomiting.   Genitourinary: Negative for dysuria, flank pain and frequency.   Musculoskeletal: Negative for back pain, joint swelling, myalgias and neck pain.   Skin:  Negative for rash.   Neurological: Negative for dizziness, weakness, light-headedness, numbness and headaches.   Psychiatric/Behavioral: Negative for behavioral problems and confusion.   All other systems reviewed and are negative.      Physical Exam    BP: 121/79, Heart Rate: 75, Temp: 98.1 F (36.7 C), Resp Rate: 18, SpO2: 99 %, Weight: 62.6 kg    Physical Exam  Vitals signs and nursing note reviewed. Exam conducted with a chaperone present.   Constitutional:       General: She is not in acute distress.     Appearance: She is well-developed.   HENT:      Head: Normocephalic and atraumatic.      Mouth/Throat:      Pharynx: No oropharyngeal exudate.   Eyes:      Conjunctiva/sclera: Conjunctivae normal.      Pupils: Pupils are equal, round, and reactive to light.   Neck:      Musculoskeletal: Normal range of motion and neck supple.      Vascular: No JVD.   Cardiovascular:      Rate and Rhythm: Normal rate and regular rhythm.   Pulmonary:      Effort: Pulmonary effort is normal. No respiratory distress.      Breath sounds: Normal breath sounds.   Abdominal:      General: Bowel sounds are decreased. There is distension.      Palpations: Abdomen is soft.      Tenderness: There is generalized abdominal tenderness. There is no guarding.   Musculoskeletal:         General: No tenderness or deformity.   Lymphadenopathy:      Cervical: No cervical adenopathy.   Skin:     General: Skin is warm and dry.   Neurological:      Mental Status: She is alert and oriented to person, place, and time.      Cranial Nerves: No cranial nerve deficit.  MDM and ED Course     ED Medication Orders (From admission, onward)    Start Ordered     Status Ordering Provider    12/05/18 1737 12/05/18 1736  benzocaine (HURRICAINE) 20 % mouth spray 1 spray  Once in ED     Route: Mouth/Throat  Ordered Dose: 1 spray     Acknowledged Juliany Daughety R    12/05/18 1737 12/05/18 1736  lidocaine viscous (XYLOCAINE) 2 % solution 5 mL  Once in ED      Route: Each Nare  Ordered Dose: 5 mL     Acknowledged Sharisa Toves R    12/05/18 1718 12/05/18 1717  sodium chloride 0.9 % bolus 1,000 mL  Once in ED     Route: Intravenous  Ordered Dose: 1,000 mL     Last MAR action: New Bag Tirso Laws R    12/05/18 1718 12/05/18 1717  ondansetron (ZOFRAN) injection 4 mg  Once in ED     Route: Intravenous  Ordered Dose: 4 mg     Last MAR action: Given Leeroy Lovings R    12/05/18 1718 12/05/18 1717  HYDROmorphone (DILAUDID) injection 0.5 mg  Once in ED     Route: Intravenous  Ordered Dose: 0.5 mg     Last MAR action: Given Haig Gerardo R         Results     Procedure Component Value Units Date/Time    CBC and differential [[id number]]  (Abnormal) Collected: 12/05/18 1707    Specimen: Blood Updated: 12/05/18 1721     WBC 11.3 K/cmm      RBC 4.36 M/cmm      Hemoglobin 14.8 gm/dL      Hematocrit 40.9 %      MCV 97 fL      MCH 34 pg      MCHC 35 gm/dL      RDW 81.1 %      PLT CT 287 K/cmm      MPV 8.0 fL      Neutrophils % 60.7 %      Lymphocytes 25.3 %      Monocytes 9.6 %      Eosinophils % 2.5 %      Basophils % 1.9 %      Neutrophils Absolute 6.9 K/cmm      Lymphocytes Absolute 2.9 K/cmm      Monocytes Absolute 1.1 K/cmm      Eosinophils Absolute 0.3 K/cmm      Basophils Absolute 0.2 K/cmm     Comprehensive metabolic panel [[id number]] Collected: 12/05/18 1707    Specimen: Plasma Updated: 12/05/18 1718    Lipase [[id number]] Collected: 12/05/18 1707    Specimen: Plasma Updated: 12/05/18 1718        No results found.      MDM    1730: I have consulted with general surgeon Dr. Jacalyn Lefevre who recommended admission to hospitalist service for conservative management of bowel obstruction with NG tube suction and IV fluids.  He will see her in consultation.    The patient presents to the Emergency Department with abdominal pain. Treatment has been initiated in the ER, but the patient has not had significant improvement in symptoms, appears ill enough and/or has  illness/findings/co-morbidities that make admission for IV medications, possible surgical consult, and further management the most appropriate disposition. Differential diagnosis has included but is not limited to appendicitis, gall bladder disease, bowel obstruction, colitis, gastroenteritis, urinary tract obstruction, AAA, pancreatitic and hepatitis.  Diagnostic impression and plan were discussed and agreed upon with the patient and/or family.  Results of lab/radiology tests were reviewed and discussed with the patient and/or family. All questions were answered and concerns addressed.  Appropriate consultation was made for admission and further treatment of this patient.     1743: I have consulted with hospitalist Dr.Ariyaratne who will see her in ED for admission.    Procedures    Clinical Impression & Disposition     Clinical Impression  Final diagnoses:   Small bowel obstruction        ED Disposition     ED Disposition Condition Date/Time Comment    Admit  Thu Dec 05, 2018  5:40 PM Kahla Lydiann Bonifas to be admitted.    Condition at disposition: stable             New Prescriptions    No medications on file                 Reginia Naas, MD  12/05/18 [phone]
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
## 2018-12-05 NOTE — Patient Instructions (Signed)
[place]:  Patient to present to the emergency department via personal vehicle. Declined transportation via EMS transport.   ER notified of patient arrival.     We make every attempt to respond to your calls and emails within 72 hours.  Please note that this is only during business hours.  If you have a question or a problem that needs attention sooner, and it is after hours please call the physician on-call at [phone], these services begin after 5PM on weekdays.  If you have an emergency, call 911.

## 2018-12-06 DIAGNOSIS — K56609 Unspecified intestinal obstruction, unspecified as to partial versus complete obstruction: Principal | ICD-10-CM

## 2018-12-06 LAB — BASIC METABOLIC PANEL
Anion Gap: 14.4 mMol/L (ref 7.0–18.0)
BUN / Creatinine Ratio: 8.8 Ratio — ABNORMAL LOW (ref 10.0–30.0)
BUN: 6 mg/dL — ABNORMAL LOW (ref 7–22)
CO2: 15.9 mMol/L — ABNORMAL LOW (ref 20.0–30.0)
Calcium: 8.2 mg/dL — ABNORMAL LOW (ref 8.5–10.5)
Chloride: 114 mMol/L — ABNORMAL HIGH (ref 98–110)
Creatinine: 0.68 mg/dL (ref 0.60–1.20)
EGFR: 116 mL/min/{1.73_m2} (ref 60–150)
Glucose: 101 mg/dL — ABNORMAL HIGH (ref 71–99)
Osmolality Calculated: 279 mOsm/kg (ref 275–300)
Potassium: 3.3 mMol/L — ABNORMAL LOW (ref 3.5–5.3)
Sodium: 141 mMol/L (ref 136–147)

## 2018-12-06 LAB — CBC AND DIFFERENTIAL
Basophils %: 1.2 % (ref 0.0–3.0)
Basophils Absolute: 0.1 10*3/uL (ref 0.0–0.3)
Eosinophils %: 1.2 % (ref 0.0–7.0)
Eosinophils Absolute: 0.1 10*3/uL (ref 0.0–0.8)
Hematocrit: 43.8 % (ref 36.0–48.0)
Hemoglobin: 15.1 gm/dL (ref 12.0–16.0)
Lymphocytes Absolute: 1.9 10*3/uL (ref 0.6–5.1)
Lymphocytes: 16.7 % (ref 15.0–46.0)
MCH: 34 pg (ref 28–35)
MCHC: 35 gm/dL (ref 31–36)
MCV: 98 fL (ref 80–100)
MPV: 8.7 fL (ref 6.0–10.0)
Monocytes Absolute: 1.1 10*3/uL (ref 0.1–1.7)
Monocytes: 9.7 % (ref 3.0–15.0)
Neutrophils %: 71.2 % (ref 42.0–78.0)
Neutrophils Absolute: 8.2 10*3/uL (ref 1.7–8.6)
PLT CT: 275 10*3/uL (ref 130–440)
RBC: 4.49 10*6/uL (ref 3.80–5.00)
RDW: 11.2 % (ref 10.5–14.5)
WBC: 11.5 10*3/uL — ABNORMAL HIGH (ref 4.0–11.0)

## 2018-12-06 LAB — LACTIC ACID, PLASMA: Lactic Acid: 1.5 mMol/L (ref 0.5–1.9)

## 2018-12-06 MED ORDER — VH HYDROMORPHONE HCL PF 1 MG/ML CARPUJECT
0.50 mg | INTRAMUSCULAR | Status: DC | PRN
Start: 2018-12-06 — End: 2018-12-08
  Administered 2018-12-06 (×5): 1 mg via INTRAVENOUS
  Administered 2018-12-07: 0.5 mg via INTRAVENOUS
  Administered 2018-12-07: 1 mg via INTRAVENOUS
  Administered 2018-12-07 – 2018-12-08 (×3): 0.5 mg via INTRAVENOUS
  Administered 2018-12-08: 1 mg via INTRAVENOUS
  Administered 2018-12-08: 0.5 mg via INTRAVENOUS
  Administered 2018-12-08: 1 mg via INTRAVENOUS
  Filled 2018-12-06 (×13): qty 1

## 2018-12-06 MED ORDER — ONDANSETRON HCL 4 MG/2ML IJ SOLN
4.00 mg | INTRAMUSCULAR | Status: DC | PRN
Start: 2018-12-06 — End: 2018-12-09
  Administered 2018-12-07 – 2018-12-08 (×2): 4 mg via INTRAVENOUS
  Filled 2018-12-06 (×2): qty 2

## 2018-12-06 MED ORDER — LORAZEPAM 2 MG/ML IJ SOLN
1.00 mg | Freq: Once | INTRAMUSCULAR | Status: AC
Start: 2018-12-06 — End: 2018-12-06
  Administered 2018-12-06: 11:00:00 1 mg via INTRAVENOUS

## 2018-12-06 MED ORDER — LORAZEPAM 2 MG/ML IJ SOLN
INTRAMUSCULAR | Status: AC
Start: 2018-12-06 — End: 2018-12-06
  Filled 2018-12-06: qty 1

## 2018-12-06 MED ORDER — ONDANSETRON HCL 4 MG/2ML IJ SOLN
4.00 mg | Freq: Four times a day (QID) | INTRAMUSCULAR | Status: DC
Start: 2018-12-06 — End: 2018-12-06
  Administered 2018-12-06: 05:00:00 4 mg via INTRAVENOUS
  Filled 2018-12-06: qty 2

## 2018-12-06 NOTE — UM Notes (Signed)
[facility] Utilization Management Review Sheet    Facility :  [facility]    NAME: [name]  MR#: [id number]    CSN#: [id number]    ROOM: 216/216-A AGE: 33 y.o.    ADMIT DATE AND TIME: 12/05/2018  4:52 PM    PATIENT CLASS: Inpatient    ATTENDING PHYSICIAN: Ariyaratne, Maharabe E, *      PAYOR:Payor: MEDICARE / Plan: MEDICARE PART A AND B / Product Type: Medicare /     AUTH #: NAR    DIAGNOSIS:     ICD-10-CM    1. Small bowel obstruction  K56.609        HISTORY:   Past Medical History:   Diagnosis Date    Anxiety     Bipolar 1 disorder     Chronic obstructive pulmonary disease     Fibromyalgia     Gastroesophageal reflux disease     Insomnia     Seasonal allergic rhinitis        DATE OF REVIEW: 12/06/2018     History of present illness:  Patient admitted through emergency department who presented with abdominal pain and nausea.     Vitals upon admission:  96-138/75-92-18-98%    Treatment in emergency department:  Patient received Zofran IV x 1, Dilaudid IV x 1, and NS bolus.    Physical exam:  Notable for abdominal tenderness on the right side of the abdomen and mid to lower abdominal quadrants. Bowel sounds hyperactive per MD documentation.    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 Rondel Baton request (surgery)  -cont NGT at low intermittent suction.  Confirmed placement  -Pain control  -anti-tussive for nausea    H/o Short Gut Syndrome s/p partial colectomy.    S/p partial small bowel resection (per patient):  -chronic nausea  -manages multiple daily BMs/diarrhea with Lomotil 4 times at home-held 2/2 above    Non anion gap,  hyperchloremic metabolic acidosis  -no prior labs for comparison but this can be 2/2 chronic HC03 loss 2/2 chronic diarrhea 2/2 short gut syndrome.  Patient states she has up to 12 BMs per day.  - Monitor.    H/o Fibromyalgia  -managed with prn Lyrica and  Zanaflex at home-on hold 2/2 illeus/ABO.     History of Hepatitis C:  -f/u with GI when discharged    Bipolar d/o  Generalized anxiety d/o  Insomnia  -no longer taking Latuda 40 mg, patient states it didn't work and stopped it ~ 1 month ago   -takes trazodone 150mg  at home, she requests Ativan while here-will give Ativan  -Cont Klonipin    Tobacco abuse, 1PPd  -counseled  -Nicotine patch    DVT PPx: Lovenox    Inpatient appropriate per [facility] 24th edition inpatient criteria M-210.    Freda Munro BSN RN  Utilization Review Nurse  [facility]  Phone: [phone]  Fax: [phone]          VITALS: BP 125/77    Pulse 68    Temp 98.6 F (37 C) (Oral)    Resp 19  Ht 1.651 m (5\' 5" )    Wt 65.1 kg (143 lb 8.3 oz)    LMP 11/13/2018 (Approximate)    SpO2 97%    BMI 23.88 kg/m

## 2018-12-06 NOTE — Plan of Care (Signed)
NURSE NOTE SUMMARY  [facility] - MEDICAL SURGICAL UNIT   Patient Name: [name]   Attending Physician: Sande Brothers, *   Today's date:   12/06/2018 LOS: 1 days   Shift Summary:                                                              0930- Pt c/o abd pain and given IV Dilaudid per order. Pt seen by Dr. Hyacinth Meeker and order placed to d/c NG tube. Dr. Claris Gower would like to assess pt prior to d/c'ing NGT. Pt c/o feeling anxious and requesting IV Ativan; MD to order.    1050- Dr. Claris Gower in to see pt and would like NGT d/c'd. NGT d/c'd and pt tolerated well. IV Ativan given as ordered earlier and pt feeling better.     1730- Pt has called out multiple times throughout the day for pain meds; sometimes minutes after giving it. Pt states that she is "sorry and forgot". No other needs noted at this time. Call bell within reach.      Provider Notifications:        Rapid Response Notifications:  Mobility:      PMP Activity: Step 6 - Walks in Room (12/06/2018  9:31 AM)     Weight tracking:  Family Dynamic:   Last 3 Weights for the past 72 hrs (Last 3 readings):   Weight   12/05/18 1814 65.1 kg (143 lb 8.3 oz)   12/05/18 1645 62.6 kg (138 lb)             Last Bowel Movement   Last BM Date: 12/06/18        Problem: Altered GI Function  Goal: Fluid and electrolyte balance are achieved/maintained  Outcome: Progressing  Flowsheets (Taken 12/06/2018 0023 by Mike Gip, RN)  Fluid and electrolyte balance are achieved/maintained:   Monitor/assess lab values and report abnormal values   Provide adequate hydration   Assess for confusion/personality changes   Monitor intake and output every shift   Assess and reassess fluid and electrolyte status   Monitor for muscle weakness  Note: Monitor labwork  Goal: Elimination patterns are normal or improving  Outcome: Progressing  Flowsheets (Taken 12/06/2018 0023 by Mike Gip, RN)  Elimination patterns are normal or improving:   Assess for  normal bowel sounds   Monitor for abdominal distension   Monitor for abdominal discomfort   Assess for flatus  Note: Assess for BM  Goal: Nutritional intake is adequate  Outcome: Progressing  Goal: Mobility/Activity is maintained at optimal level for patient  Outcome: Progressing  Goal: No bleeding  Outcome: Progressing     Problem: Moderate/High Fall Risk Score >5  Goal: Patient will remain free of falls  Outcome: Progressing     Problem: Compromised Tissue integrity  Goal: Damaged tissue is healing and protected  Outcome: Progressing  Goal: Nutritional status is improving  Outcome: Progressing

## 2018-12-06 NOTE — Progress Notes (Addendum)
NURSE NOTE SUMMARY  [facility] - MEDICAL SURGICAL UNIT   Patient Name: [name]   Attending Physician: Sande Brothers, *   Today's date:   12/06/2018 LOS: 1 days   Shift Summary:                                                              ~19:20 Pt is a/o x4. Oriented to room and use of call bell. Assessment completed. Bowels sounds hyperactive. Ng tube secured with no output in container at this point. IV fluids completed, saline locked, blood return. Ng tube 68    ~2039: md notified of oral medications, no placement check via xray. Portable xray ordered per md to check for ng tube placement, order for suction at intermittent low suction. Pt up to the bsc. Room air. Pt wants to have trazadone but requests to have ativan instead since she has the ng tube, md order for iv ativan 0.5 mg and an additional 0.5mg  if needed one time orders. Pt complaining of pain in abdomen and throat, crying and attempting to pull at ng tube, pt encouraged not to and ng tube secured.     ~2128: md requests to have another order for 0.5mg  of dilaudid placed. Iv patent, blood return noted, dilauid administered. Call bell and bedside table in reach. Will continue to monitor.     ~2325: IV ativan administered.     ~0000: pt still complaining of not being able to go to sleep, RR even 18, pt up to the bsc medium sized loose to medium stool. Additional ativan (0.5mg ) administered per md order. Pt noted without any signs of distress,watching tv and on her phone, pt made aware its not time for pain medication, pt now frustrated stating "shut my door!".emotional support provided.     ~0226: pt was trying to blow her nose. Stating "the tube almost came out". Assessed tube, still at ~68. Secured with more tape. Call bell and bedside table in reach. Will continue to monitor.     ~0527: PRN dilaudid administered. Iv patent, blood return noted. zofran administered due to nausea. Ng tube on to low  intermittent suction with 150 contents mostly with sediment at the bottom from beginning of shift and now more yellowish/ greenish fluid. 25ml of gastric fluid noted placed back in, and flushed tube with 30ml of fluid, open to suction.   ~1610: pt now resting with eyes closed. rr even. No sign of distress noted.      Provider Notifications:        Rapid Response Notifications:  Mobility:      PMP Activity: Step 6 - Walks in Room (12/05/2018  7:56 PM)     Weight tracking:  Family Dynamic:   Last 3 Weights for the past 72 hrs (Last 3 readings):   Weight   12/05/18 1814 65.1 kg (143 lb 8.3 oz)   12/05/18 1645 62.6 kg (138 lb)             Last Bowel Movement   Last BM Date: 12/02/18 (per pt)        Problem: Altered GI Function  Goal: Fluid and electrolyte balance are achieved/maintained  Description: Interventions:  1. Monitor intake and output every shift  2. Monitor/assess lab values and report  abnormal values  3. Provide adequate hydration  4. Assess for confusion/personality changes  5. Monitor daily weight  6. Assess and reassess fluid and electrolyte status  7. Observe for seizure activity and initiate seizure precautions if indicated  8. Observe for cardiac arrhythmias  9. Monitor for muscle weakness  Outcome: Progressing  Flowsheets (Taken 12/06/2018 0023)  Fluid and electrolyte balance are achieved/maintained:   Monitor/assess lab values and report abnormal values   Provide adequate hydration   Assess for confusion/personality changes   Monitor intake and output every shift   Assess and reassess fluid and electrolyte status   Monitor for muscle weakness  Goal: Elimination patterns are normal or improving  Description: Interventions:  1.      Report abnormal assessment to physician  2.      Anticipate/assist with toileting needs  3.      Assess for normal bowel sounds  4.      Monitor for abdominal distension  5.      Monitor for abdominal discomfort  6.      Assess for signs and symptoms of bleeding.   Report signs of bleeding to physician   7.      Administer treatments as ordered  8.      Consult/collaborate with Clinical Nutritionist  9.      Assess for flatus  10.   Assess for and discuss C. diff screening with LIP  11.   Collaborate with LIP for containment device  12.   Reinforce education on foods that improve and complicate bowel movements and how activity and medications can affect bowel movements  13.   Administer medications to improve bowel evacuation as prescribed  14.   Encourage/perform oral hygiene as appropriate  Outcome: Progressing  Flowsheets (Taken 12/06/2018 0023)  Elimination patterns are normal or improving:   Assess for normal bowel sounds   Monitor for abdominal distension   Monitor for abdominal discomfort   Assess for flatus  Goal: Nutritional intake is adequate  Description: Interventions:  1. Monitor daily weights  2. Assist patient with meals/food selection  3. Allow adequate time for meals  4. Encourage/perform oral hygiene as appropriate   5. Encourage/administer dietary supplements as ordered (i.e. tube feed, TPN, oral, OGT/NGT, supplements)  6. Consult/collaborate with Clinical Nutritionist  7. Include patient/patient care companion in decisions related to nutrition  8. Assess anorexia, appetite, and amount of meal/food tolerated  9. Consult/collaborate with Speech Therapy (swallow evaluations)  Outcome: Progressing  Flowsheets (Taken 12/06/2018 0023)  Nutritional intake is adequate:   Assist patient with meals/food selection   Allow adequate time for meals  Goal: Mobility/Activity is maintained at optimal level for patient  Description: Interventions:  1. Increase mobility as tolerated/progressive mobility  2. Encourage independent activity per ability  3. Maintain proper body alignment  4. Perform active/passive ROM  5. Plan activities to conserve energy, plan rest periods  6. Reposition patient every 2 hours and as needed unless able to reposition self  7. Assess for  changes in respiratory status, level of consciousness and/or development of fatigue  8. Consult/collaborate with Physical Therapy and/or Occupational Therapy  Outcome: Progressing  Flowsheets (Taken 12/06/2018 0023)  Mobility/activity is maintained at optimal level for patient:   Perform active/passive ROM   Increase mobility as tolerated/progressive mobility   Reposition patient every 2 hours and as needed unless able to reposition self   Plan activities to conserve energy, plan rest periods   Maintain proper body alignment  Goal:  No bleeding  Description: Interventions:  1. Monitor and assess vitals and hemodynamic parameters  2. Monitor/assess lab values and report abnormal values  3. Assess for bruising/petechia  Outcome: Progressing  Flowsheets (Taken 12/06/2018 0023)  No bleeding: Monitor/assess lab values and report abnormal values

## 2018-12-06 NOTE — Progress Notes (Signed)
Medicine Progress Note   [facility]  [employer]   Patient Name: [name],[name] LOS: 1 days   Attending Physician: Sande Brothers, * PCP: Kathi Simpers, NP   MRN: [id number]                                      CSN: [id number] DOB: 02-10-1985      Hospital Course:                                                            [name] is a 33 y.o. female patient with PMH including but not limited to short gut syndrome, chronic hepatitis C, Crohn's disease, PCOS, [place] seizures, bipolar disorder, Fibromyalgia, opiate abuse age [age], none since 2013 s/p rehab, Cholecystectomy, Appendectomy, Hysterectomy, colectomy and h/o of multiple bowel obstructions.  Patient returns to [facility] ED today with worsening right sided abdominal pain, intermittent, and increasing nausea worse than her chronic nausea.  Onset of achy right sided abdominal pain ~6 days, worse with food and has not eaten now for x2 days.  Patient usually has numerous BMs (diarrhea upto 12/day) but no BMs x2 days and reports not passing gas as well.  Denies F/c, cough, SOB, chest pain or dizziness.  Patient was seen at [facility] ED on 12/03/18 for similar issue and CT abd/pelvis with contrast showed Ileus with nonspecific malabsorptive process. She was offered to be admitted but she had declined. 12/05/18 AXR: Diffuse gaseous distention of the bowel consistent with either distal colon obstruction or ileus.     Assessment and Plan:      Ileus Vs SBO  H/o multiple recurrent ileus/bowel obstruction s/p partial colectomy and partial small bowel resection  - NPO, IVF  - NGT has been placed in ED per Dr. Rondel Baton request (surgery) >> NG to be removed today per surgery   - Pain control  - anti-tussive for nausea    H/o Short Gut Syndrome s/p partial colectomy.    S/p partial small bowel resection:  chronic nausea  - manages multiple daily BMs/diarrhea with Lomotil 4 times at home    Non anion gap, hyperchloremic  metabolic acidosis  - no prior labs for comparison but this can be 2/2 chronic HC03 loss 2/2 chronic diarrhea 2/2 short gut syndrome.  Patient states she has up to 12 BMs per day.  - Monitor.    H/o Fibromyalgia  - managed with prn Lyrica and  Zanaflex at home-on hold 2/2 illeus/ABO.     History of Hepatitis C:  - f/u with GI when discharged    Bipolar d/o  Generalized anxiety d/o  Insomnia  - takes trazodone 150mg  at home, she requests Ativan while here-will give Ativan  - Cont Klonipin    Tobacco abuse, 1PPd  - counseled  - Nicotine patch    Disposition: pending progress   DVT PPX:  Lovenox  Code:  Full Code     Subjective   Patient still feels nauseated, No vomiting. Abdominal pain present. Chronic diarrhea. Afebrile.       Objective   Physical Exam:     Vitals: T:98.6 F (37 C) (Oral), BP:125/77, HR:68, RR:19, SaO2:97%    General:  Patient is awake. In no acute distress.  HEENT: No conjunctival drainage, vision is intact, anicteric sclera.  Neck: Supple, no thyromegaly.  Chest: CTA bilaterally. No rhonchi, no wheezing. No use of accessory muscles.  CVS: Normal rate and regular rhythm no murmurs, without JVD, no pitting edema, pulses palpable.  Abdomen: Soft, non-tender, no guarding or rigidity, with normal bowel sounds.  Extremities: No calf swelling and no gross deformity.  Skin: Warm, dry, no rash and no worrisome lesions.  NEURO: No motor or sensory deficits.  Psychiatric: Alert, interactive, appropriate, normal affect.  Weight Monitoring 10/22/2018 11/15/2018 12/03/2018 12/03/2018 12/05/2018 12/05/2018 12/05/2018   Height 165.1 cm 165.1 cm 165.1 cm 165.1 cm 165.1 cm 165.1 cm 165.1 cm   Height Method - - - Stated - Stated Stated   Weight 63.957 kg 64.774 kg 63.05 kg 64.1 kg 64.411 kg 62.596 kg 65.1 kg   Weight Method - - - Actual - Stated Bed Scale   BMI (calculated) 23.5 kg/m2 23.8 kg/m2 23.2 kg/m2 23.6 kg/m2 23.7 kg/m2 23 kg/m2 23.9 kg/m2         Intake/Output Summary (Last 24 hours) at 12/06/2018  1249  Last data filed at 12/06/2018 0100  Gross per 24 hour   Intake 120 ml   Output 130 ml   Net -10 ml     Body mass index is 23.88 kg/m.     Meds:     Current Facility-Administered Medications   Medication Dose Route Frequency    LORazepam        clonazePAM  0.5 mg Oral BID    enoxaparin  40 mg Subcutaneous Q24H    fluticasone furoate-vilanterol  1 puff Inhalation QAM    nicotine  1 patch Transdermal Daily at 2000    ondansetron  4 mg Intravenous Q6H    sodium chloride (PF)  3 mL Intravenous Q8H       PRN Meds: albuterol, HYDROmorphone, naloxone.     LABS:     Estimated Creatinine Clearance: 106.9 mL/min (based on SCr of 0.68 mg/dL).  Recent Labs   Lab 12/06/18  0504 12/05/18  1707   WBC 11.5* 11.3*   RBC 4.49 4.36   Hemoglobin 15.1 14.8   Hematocrit 43.8 42.4   MCV 98 97   PLT CT 275 287             No results found for: HGBA1CPERCNT  Recent Labs   Lab 12/06/18  0504 12/05/18  1707 12/03/18  2119   Glucose 101* 92 92   Sodium 141 142 138   Potassium 3.3* 4.2 3.7   Chloride 114* 112* 110   CO2 15.9* 17.4* 18.5*   BUN 6* 6* 14   Creatinine 0.68 0.77 0.89   EGFR 116 102 86   Calcium 8.2* 8.4* 8.3*     Recent Labs   Lab 12/05/18  1707 12/03/18  2119   Albumin 3.8 3.8   Protein, Total 7.2 6.7   Bilirubin, Total 0.2 0.4   Alkaline Phosphatase 107 105   ALT 13 11   AST (SGOT) 21 13           Invalid input(s):  AMORPHOUSUA   Patient Lines/Drains/Airways Status    Active PICC Line / CVC Line / PIV Line / Drain / Airway / Intraosseous Line / Epidural Line / ART Line / Line / Wound / Pressure Ulcer / NG/OG Tube     Name:   Placement date:   Placement time:   Site:  Days:    Peripheral IV 12/05/18 22 G Right Forearm   12/05/18    1717    Forearm   less than 1    NG/OG Tube Nasogastric 16 Fr. Right nostril   12/05/18    1757    Right nostril   less than 1               Xr Chest Ap Portable    Result Date: 12/05/2018  NG tube tip in the region of the gastric body. No focal consolidation, pleural effusion, or  pneumothorax. Normal heart size. ReadingStation:[email or web address]    Ct Abdomen Pelvis W Iv And Po Cont    Result Date: 12/03/2018  Ileus with nonspecific malabsorptive process. Recommendations: None. ReadingStation:[email or web address]    Xr Abdomen 2 View With Cxr    Result Date: 12/05/2018  Diffuse gaseous distention of the bowel consistent with either distal colon obstruction or ileus. ReadingStation:[email or web address]     Home Health Needs:  There are no questions and answers to display.       Nutrition assessment done in collaboration with Registered Dietitians:     Time spent:      Sande Brothers, MD     12/06/18,12:49 PM

## 2018-12-06 NOTE — Consults (Signed)
Consult   Name:  EVERLEAN, KUTSCHER                 MR#:  [id number]               Date:  12/06/18       IMPRESSION:   33 y.o. female with <principal problem not specified>.    Additional Diagnoses being addressed this admission also include:  Active Hospital Problems    Diagnosis    SBO (small bowel obstruction)     She has a longstanding history of multiple abdominal operations as well as multiple bowel obstructions.  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  Subsequent to this she has had multiple bowel obstructions.  3 days ago she developed nausea, abdominal pain, and diarrhea.  She was seen in the emergency department a CT scan demonstrated an ileus.  She went home and subsequently did not pass stool or flatus.  She returns today.  X-ray demonstrates gaseous distention.  White count of 11.  An NG tube placed, and overnight she passed a significant amount of flatus as well as of large loose bowel movement.    PLAN:   Admit to Surgical floor.  IV fluid hydration, n.p.o.; NG tube may be removed  Serial abdominal exams  No surgical intervention required at this time.  We will follow along.    Hattie Perch, MD   [phone]    CC:  Patient complains of diffuse abdominal pain.      HPI:   33 y.o. female presents with generalized abdominal pain.    The pain is described as sharp and stabbing, moderate to severe, and has been constant.  The pain is without radiation.    The pain began 3 days ago and have progressed to a point and plateaued.  Associated symptoms include diarrhea and nausea. She denies chills, fever and sweats. Aggravating factors include movement, pressure and eating. Alleviating factors include none. She has had similar episodes in the past.      PMH:   She  has a past medical history of Anxiety, Bipolar 1 disorder, Chronic obstructive pulmonary disease, Fibromyalgia, Gastroesophageal reflux disease, Insomnia, and Seasonal allergic rhinitis.     PSH:   She  has  a past surgical history that includes COLON RESECTION; Colon surgery; Cholecystectomy; Appendectomy; and Hysterectomy.     Social History:   She  reports that she has been smoking cigarettes. She has been smoking about 1.00 pack per day. She has never used smokeless tobacco. She reports previous drug use. She reports that she does not drink alcohol.    Family History:   Family history has been reviewed and has no pertinent positives to this evaluation.    Home Medications:     Outpatient Medications Marked as Taking for the 12/05/18 encounter [facility] Encounter)   Medication Sig Dispense Refill    albuterol sulfate HFA (PROVENTIL) 108 (90 Base) MCG/ACT inhaler Inhale 2 puffs into the lungs every 4 (four) hours as needed for Wheezing or Shortness of Breath 1 Inhaler 2    clonazePAM (KlonoPIN) 0.5 MG tablet Take 1 tablet (0.5 mg total) by mouth 2 (two) times daily This is to be taken over by psychiatry on Nov 25, 2018 60 tablet 1    diphenoxylate-atropine (Lomotil) 2.5-0.025 MG per tablet Take 2 tablets by mouth 4 (four) times daily 240 tablet 2    fluticasone-salmeterol (ADVAIR HFA) 230-21 MCG/ACT inhaler Inhale 2 puffs into the lungs 2 (two)  times daily 12 g 2    HYDROcodone-acetaminophen (NORCO) 5-325 MG per tablet Take 1 tablet by mouth every 6 (six) hours as needed for Pain 8 tablet 0    ibuprofen (ADVIL) 800 MG tablet Take 1 tablet (800 mg total) by mouth every 8 (eight) hours as needed for Pain 30 tablet 1    ondansetron (Zofran ODT) 4 MG disintegrating tablet Take 1 tablet (4 mg total) by mouth every 8 (eight) hours as needed for Nausea 8 tablet 0    pregabalin (LYRICA) 100 MG capsule Take 1 capsule (100 mg total) by mouth 2 (two) times daily 60 capsule 1    tiZANidine (ZANAFLEX) 4 MG tablet Take 2 tablets (8 mg total) by mouth every 8 (eight) hours as needed (fibromyalgia pain) 90 tablet 1    traZODone (DESYREL) 150 MG tablet Take 1.5 -2 tabs PO PRN at HS. 60 tablet 1       Allergies:   She is  allergic to fentanyl; reglan [metoclopramide]; rocephin [ceftriaxone]; tramadol; and vancomycin.     ROS      (10+):   Pertinent details are included in HPI.    Otherwise 10 point review of systems is negative    Physical Exam      (8+)   Blood pressure 125/77, pulse 68, temperature 98.6 F (37 C), temperature source Oral, resp. rate 19, height 1.651 m (5\' 5" ), weight 65.1 kg (143 lb 8.3 oz), last menstrual period 11/13/2018, SpO2 97 %.   General: well-nourished, in no apparent distress, appears comfortable  HEENT(2-eye/+): normocephalic, atraumatic, sclera clear and anicteric, oropharynx clear  Neck(0): supple, trachea midline, no JVD  Chest: equal breath sounds, unlabored respirations  Abdomen: soft, mild to moderate generalized tenderness without rebound and without guarding, non-distended  Extremities: no edema and no cyanosis or clubbing  Psych: normal affect, insight good  Skin: no jaundice, no rashes, normothermic    Labs:    Lab results have been reviewed as follows:    Results     Procedure Component Value Units Date/Time    Basic Metabolic Panel [[id number]]  (Abnormal) Collected: 12/06/18 0504    Specimen: Plasma Updated: 12/06/18 0602     Sodium 141 mMol/L      Potassium 3.3 mMol/L      Chloride 114 mMol/L      CO2 15.9 mMol/L      Calcium 8.2 mg/dL      Glucose 409 mg/dL      Creatinine 8.11 mg/dL      BUN 6 mg/dL      Anion Gap 91.4 mMol/L      BUN / Creatinine Ratio 8.8 Ratio      EGFR 116 mL/min/1.52m2      Osmolality Calculated 279 mOsm/kg     CBC and differential [[id number]]  (Abnormal) Collected: 12/06/18 0504    Specimen: Blood Updated: 12/06/18 0557     WBC 11.5 K/cmm      RBC 4.49 M/cmm      Hemoglobin 15.1 gm/dL      Hematocrit 08.6 %      MCV 98 fL      MCH 34 pg      MCHC 35 gm/dL      RDW 57.8 %      PLT CT 275 K/cmm      MPV 8.7 fL      Neutrophils % 71.2 %      Lymphocytes 16.7 %      Monocytes 9.7 %  Eosinophils % 1.2 %      Basophils % 1.2 %      Neutrophils Absolute 8.2 K/cmm       Lymphocytes Absolute 1.9 K/cmm      Monocytes Absolute 1.1 K/cmm      Eosinophils Absolute 0.1 K/cmm      Basophils Absolute 0.1 K/cmm     Comprehensive metabolic panel [[id number]]  (Abnormal) Collected: 12/05/18 1707    Specimen: Plasma Updated: 12/05/18 1742     Sodium 142 mMol/L      Potassium 4.2 mMol/L      Chloride 112 mMol/L      CO2 17.4 mMol/L      Calcium 8.4 mg/dL      Glucose 92 mg/dL      Creatinine 2.13 mg/dL      BUN 6 mg/dL      Protein, Total 7.2 gm/dL      Albumin 3.8 gm/dL      Alkaline Phosphatase 107 U/L      ALT 13 U/L      AST (SGOT) 21 U/L      Bilirubin, Total 0.2 mg/dL      Albumin/Globulin Ratio 1.12 Ratio      Anion Gap 16.8 mMol/L      BUN / Creatinine Ratio 7.8 Ratio      EGFR 102 mL/min/1.27m2      Osmolality Calculated 280 mOsm/kg      Globulin 3.4 gm/dL     Lipase [[id number]] Collected: 12/05/18 1707    Specimen: Plasma Updated: 12/05/18 1742     Lipase 37 U/L     CBC and differential [[id number]]  (Abnormal) Collected: 12/05/18 1707    Specimen: Blood Updated: 12/05/18 1721     WBC 11.3 K/cmm      RBC 4.36 M/cmm      Hemoglobin 14.8 gm/dL      Hematocrit 24.4 %      MCV 97 fL      MCH 34 pg      MCHC 35 gm/dL      RDW 01.0 %      PLT CT 287 K/cmm      MPV 8.0 fL      Neutrophils % 60.7 %      Lymphocytes 25.3 %      Monocytes 9.6 %      Eosinophils % 2.5 %      Basophils % 1.9 %      Neutrophils Absolute 6.9 K/cmm      Lymphocytes Absolute 2.9 K/cmm      Monocytes Absolute 1.1 K/cmm      Eosinophils Absolute 0.3 K/cmm      Basophils Absolute 0.2 K/cmm            Radiology:   Xr Chest Ap Portable    Result Date: 12/05/2018  NG tube tip in the region of the gastric body. No focal consolidation, pleural effusion, or pneumothorax. Normal heart size. ReadingStation:[email or web address]    Xr Abdomen 2 View With Cxr    Result Date: 12/05/2018  Diffuse gaseous distention of the bowel consistent with either distal colon obstruction or ileus. ReadingStation:[email or web address]      Outside films: none

## 2018-12-06 NOTE — Progress Notes (Signed)
Readmission Risk  [facility] - MEDICAL SURGICAL UNIT   Patient Name: [name]   Attending Physician: Sande Brothers, *   Today's date:   12/06/2018 LOS: 1 days   Expected Discharge Date      Readmission Assessment:                                                              Discharge Planning  ReAdmit Risk Score: 7  Confirmed PCP with Pt: Yes  Confirmed PCP name: Marja Kays  Last PCP Visit Date: 12/05/2018  Anticipated Home Health at [place]: No  Anticipated Placement at [place]: No  CM Comments: 11/13- home       IDPA:   Patient Type  Within 30 Days of Previous Admission?: No  Healthcare Decisions  Interviewed:: Patient  Orientation/Decision Making Abilities of Patient: Alert and Oriented x3, able to make decisions  Advance Directive: Patient does not have advance directive  Healthcare Agent Appointed: No  Prior to admission  Type of Residence: Private residence  Have running water, electricity, heat, etc?: Yes  Living Arrangements: Spouse/significant other  Dressing: Independent  Grooming: Independent  Feeding: Independent  Bathing: Independent  Toileting: Independent  Discharge Planning  Support Systems: Spouse/significant other  Patient expects to be discharged to:: home  Anticipated  plan discussed with:: Same as interviewed  Mode of transportation:: Private car (family member)  Consults/Providers  PT Evaluation Needed: No  OT Evalulation Needed: No  SLP Evaluation Needed: No  Correct PCP listed in Epic?: Yes  Family and PCP  PCP on file was verified as the current PCP?: Yes   30 Day Readmission:       Provider Notifications:        Chart reviewed.  Case discussed at morning rounds.  Patient admitted with ileus vs SBO.  NPO, NGT, IVF, pain and nausea management, surgery.  SW met with patient.  She reports that she lives at home with significant other.  She is active and independent.  She reports no needs for discharge.  SW to follow and assist further with discharge  planning, if needed    Ronnald Collum [place], [place]  [id number]

## 2018-12-06 NOTE — Progress Notes (Signed)
Called by primary nurse to talk with patient regarding sleep medication.  Went in room, patient watching tv, drowsy, HOB elevated.  No c/o pain or discomfort.  Patient requested Ativan for sleep, patient stated, "they told me today that I could get something for sleep tonight".  Explained to patient that Klonopin was ordered and she requested that earlier at 1721, and that Ativan was ordered as a one time dose and Dr. Berneice Heinrich did not reorder medication.  Patient stated, "how am I suppose to do to go to sleep without medication"?  Educated patient on rest and relaxation techniques.  Patient covered head up with sheet.  2 side rails up, call bell in reach.

## 2018-12-06 NOTE — Progress Notes (Addendum)
NURSE NOTE SUMMARY  [facility] - MEDICAL SURGICAL UNIT   Patient Name: [name]   Attending Physician: Sande Brothers, *   Today's date:   12/06/2018 LOS: 1 days   Shift Summary:                                                              ~2142: pt had called out complaining of pain. Went into room and pt resting with eyes closed. No signs of distress noted. Complaints of pain 8/10 at this time, iv patent, prn medication administered. Pt now asking for sleeping medication, pt requesting ativan for sleep. Pt looks very comfortable with no physical signs of distress noted, RR even. Discussed with MD pt requesting something for sleep, this nurse does not feel comfortable administering any sleeping medication at this time with the addition of pain medication.     ~2200: explained to patient that she would not be getting anything for sleep due to the effects of sleeping medication and pain medication combined. Pt seemed frustrated pulling her blanket over her head. And requested door to be shut. Emotional support provided. Call bell and bedside table in reach. Will continue to monitor.       ~0145: pt used call bell, wanting pain medication for pain of 8/10 in abdomen. Abdomen is soft and nondistended. VS obtained. PRN pain medication administered. Call bell and bedside table in reach. Will continue to monitor.     Provider Notifications:        Rapid Response Notifications:  Mobility:      PMP Activity: Step 6 - Walks in Room (12/06/2018  9:31 AM)     Weight tracking:  Family Dynamic:   Last 3 Weights for the past 72 hrs (Last 3 readings):   Weight   12/05/18 1814 65.1 kg (143 lb 8.3 oz)   12/05/18 1645 62.6 kg (138 lb)             Last Bowel Movement   Last BM Date: 12/06/18        Problem: Altered GI Function  Goal: Fluid and electrolyte balance are achieved/maintained  Description: Interventions:  1. Monitor intake and output every shift  2. Monitor/assess lab values and  report abnormal values  3. Provide adequate hydration  4. Assess for confusion/personality changes  5. Monitor daily weight  6. Assess and reassess fluid and electrolyte status  7. Observe for seizure activity and initiate seizure precautions if indicated  8. Observe for cardiac arrhythmias  9. Monitor for muscle weakness  Outcome: Progressing  Flowsheets (Taken 12/06/2018 0023)  Fluid and electrolyte balance are achieved/maintained:   Monitor/assess lab values and report abnormal values   Provide adequate hydration   Assess for confusion/personality changes   Monitor intake and output every shift   Assess and reassess fluid and electrolyte status   Monitor for muscle weakness  Goal: Elimination patterns are normal or improving  Description: Interventions:  1.      Report abnormal assessment to physician  2.      Anticipate/assist with toileting needs  3.      Assess for normal bowel sounds  4.      Monitor for abdominal distension  5.      Monitor for abdominal discomfort  6.  Assess for signs and symptoms of bleeding.  Report signs of bleeding to physician   7.      Administer treatments as ordered  8.      Consult/collaborate with Clinical Nutritionist  9.      Assess for flatus  10.   Assess for and discuss C. diff screening with LIP  11.   Collaborate with LIP for containment device  12.   Reinforce education on foods that improve and complicate bowel movements and how activity and medications can affect bowel movements  13.   Administer medications to improve bowel evacuation as prescribed  14.   Encourage/perform oral hygiene as appropriate  Outcome: Progressing  Flowsheets (Taken 12/06/2018 0023)  Elimination patterns are normal or improving:   Assess for normal bowel sounds   Monitor for abdominal distension   Monitor for abdominal discomfort   Assess for flatus  Goal: Nutritional intake is adequate  Description: Interventions:  1. Monitor daily weights  2. Assist patient with meals/food  selection  3. Allow adequate time for meals  4. Encourage/perform oral hygiene as appropriate   5. Encourage/administer dietary supplements as ordered (i.e. tube feed, TPN, oral, OGT/NGT, supplements)  6. Consult/collaborate with Clinical Nutritionist  7. Include patient/patient care companion in decisions related to nutrition  8. Assess anorexia, appetite, and amount of meal/food tolerated  9. Consult/collaborate with Speech Therapy (swallow evaluations)  Outcome: Progressing  Flowsheets (Taken 12/06/2018 0023)  Nutritional intake is adequate:   Assist patient with meals/food selection   Allow adequate time for meals  Goal: Mobility/Activity is maintained at optimal level for patient  Description: Interventions:  1. Increase mobility as tolerated/progressive mobility  2. Encourage independent activity per ability  3. Maintain proper body alignment  4. Perform active/passive ROM  5. Plan activities to conserve energy, plan rest periods  6. Reposition patient every 2 hours and as needed unless able to reposition self  7. Assess for changes in respiratory status, level of consciousness and/or development of fatigue  8. Consult/collaborate with Physical Therapy and/or Occupational Therapy  Outcome: Progressing  Flowsheets (Taken 12/06/2018 0023)  Mobility/activity is maintained at optimal level for patient:   Perform active/passive ROM   Increase mobility as tolerated/progressive mobility   Reposition patient every 2 hours and as needed unless able to reposition self   Plan activities to conserve energy, plan rest periods   Maintain proper body alignment  Goal: No bleeding  Description: Interventions:  1. Monitor and assess vitals and hemodynamic parameters  2. Monitor/assess lab values and report abnormal values  3. Assess for bruising/petechia  Outcome: Progressing  Flowsheets (Taken 12/06/2018 0023)  No bleeding: Monitor/assess lab values and report abnormal values     Problem: Moderate/High Fall Risk Score  >5  Description: Fall Risk Score > 5  Goal: Patient will remain free of falls  Outcome: Progressing  Flowsheets (Taken 12/06/2018 2205)  VH High Risk (Greater than 13):   ALL REQUIRED LOW INTERVENTIONS   ALL REQUIRED MODERATE INTERVENTIONS   RED "HIGH FALL RISK" SIGNAGE   BED ALARM WILL BE ACTIVATED WHEN THE PATEINT IS IN BED WITH SIGNAGE "RESET BED ALARM"   A CHAIR PAD ALARM WILL BE USED WHEN PATIENT IS UP SITTING IN A CHAIR   Keep door open for better visibility     Problem: Compromised Tissue integrity  Goal: Damaged tissue is healing and protected  Description: Interventions:  1. Monitor/assess Braden scale every shift  2. Provide wound care per wound care algorithm  3. Reposition patient every 2 hours and as needed unless able to reposition self  4. Increase activity as tolerated/progressive mobility  5. Relieve pressure to bony prominences for patients at moderate and high risk  6. Avoid shearing injuries   7. Keep intact skin clean and dry  8. Use bath wipes, not soap and water, for daily bathing   9. Use incontinence wipes for cleaning urine, stool and caustic drainage; Foley care as needed   10. Monitor external devices/tubes for correct placement to prevent pressure, friction and shearing   11. Encourage use of lotion/moisturizer on skin  12. Monitor patient's hygiene practices  13. Consult/collaborate with wound care nurse   14. Utilize specialty bed  15. Consider placing an indwelling catheter if incontinence interferes with healing of stage 3 or 4 pressure injury  Outcome: Progressing  Goal: Nutritional status is improving  Description: Interventions:  1. Assist patient with eating   2. Allow adequate time for meals   3. Encourage patient to take dietary supplement(s) as ordered   4. Collaborate with Clinical Nutritionist  5. Include patient/patient care companion in decisions related to nutrition  Outcome: Progressing  Flowsheets (Taken 12/06/2018 2205)  Nutritional status is improving: Allow  adequate time for meals

## 2018-12-06 NOTE — Progress Notes (Addendum)
Patient rang out stating that she was having increasing anxiety. I asked her what I could do and she stated 'well they normally just give me meds" chatted with patient appeared to be calm, and thoughts were organized.  Obtained vital signs. Explained I would notify nurse. Patient denies any further needs at this time.

## 2018-12-07 LAB — RENAL FUNCTION PANEL
Albumin: 3.4 gm/dL — ABNORMAL LOW (ref 3.5–5.0)
Anion Gap: 15 mMol/L (ref 7.0–18.0)
BUN / Creatinine Ratio: 8.6 Ratio — ABNORMAL LOW (ref 10.0–30.0)
BUN: 6 mg/dL — ABNORMAL LOW (ref 7–22)
CO2: 18.8 mMol/L — ABNORMAL LOW (ref 20.0–30.0)
Calcium: 8.6 mg/dL (ref 8.5–10.5)
Chloride: 108 mMol/L (ref 98–110)
Creatinine: 0.7 mg/dL (ref 0.60–1.20)
EGFR: 115 mL/min/{1.73_m2} (ref 60–150)
Glucose: 104 mg/dL — ABNORMAL HIGH (ref 71–99)
Osmolality Calculated: 274 mOsm/kg — ABNORMAL LOW (ref 275–300)
Phosphorus: 2.4 mg/dL (ref 2.3–4.7)
Potassium: 3.8 mMol/L (ref 3.5–5.3)
Sodium: 138 mMol/L (ref 136–147)

## 2018-12-07 LAB — MAGNESIUM: Magnesium: 1.8 mg/dL (ref 1.6–2.6)

## 2018-12-07 LAB — TROPONIN I: Troponin I: <0.01 ng/mL (ref 0.00–0.02)

## 2018-12-07 MED ORDER — VH POTASSIUM CHLORIDE CRYS ER 20 MEQ PO TBCR (WRAP)
40.00 meq | EXTENDED_RELEASE_TABLET | Freq: Once | ORAL | Status: AC
Start: 2018-12-07 — End: 2018-12-07
  Administered 2018-12-07: 10:00:00 40 meq via ORAL
  Filled 2018-12-07: qty 2

## 2018-12-07 MED ORDER — LORAZEPAM 0.5 MG PO TABS
0.50 mg | ORAL_TABLET | Freq: Once | ORAL | Status: AC
Start: 2018-12-07 — End: 2018-12-07
  Administered 2018-12-07: 22:00:00 0.5 mg via ORAL
  Filled 2018-12-07: qty 1

## 2018-12-07 MED ORDER — LORAZEPAM 2 MG/ML IJ SOLN
1.00 mg | Freq: Once | INTRAMUSCULAR | Status: AC
Start: 2018-12-07 — End: 2018-12-07
  Administered 2018-12-07: 12:00:00 1 mg via INTRAVENOUS
  Filled 2018-12-07: qty 1

## 2018-12-07 MED ORDER — VH POTASSIUM CHLORIDE CRYS ER 20 MEQ PO TBCR (WRAP)
40.00 meq | EXTENDED_RELEASE_TABLET | Freq: Once | ORAL | Status: AC
Start: 2018-12-07 — End: 2018-12-07
  Administered 2018-12-07: 23:00:00 40 meq via ORAL
  Filled 2018-12-07 (×2): qty 2

## 2018-12-07 NOTE — Plan of Care (Signed)
Asymptomatic bradycardia. Blood pressure stable.  Troponin negative.  EKG shows sinus bradycardia.  Renal function repeated. Potassium 3.8. Will give 40 mEq of potassium by mouth to keep potassium greater than 4. Magnesium stable at 1.8.

## 2018-12-07 NOTE — Plan of Care (Addendum)
Marland Kitchen Marland Kitchen ANURSE NOTE SUMMARY  [facility] - MEDICAL SURGICAL UNIT   Patient Name: [name]   Attending Physician: Sande Brothers, *   Today's date:   12/07/2018 LOS: 2 days   Shift Summary:                                                              1900: Assumed care of patient. No needs at this time.     2000: Patient complained of pain of 9 in the Abdomen. Has PRN pain meds ordered. Patient complained of not being able to sleep at night and stated that one time dose of ativan was given during the day instead of during the night. Per report patient had requested for ativan during the day. Hospitalist informed. See new orders.     2015: Patient refused to have melatonin stating it gives her nightmares. Order not placed.     0400: MD said it is alright to administer PRN pain meds. HR was 39 to low 50s. Patient denies any symptoms. Currently on tele for close monitoring. Will continue monitor.    Provider Notifications:   2000: Dr. Audley Hose notified of patient's low heart rate in the 40s without any symptoms.  See new orders. MD notified of patient requesting for ativan for sleep. MD ordered ativan and melatonin one for sleep.     0400: Notified MD of HR dropping to 39 per tele.    Rapid Response Notifications:  Mobility:      PMP Activity: Step 6 - Walks in Room (12/07/2018  7:37 PM)     Weight tracking:  Family Dynamic:   Last 3 Weights for the past 72 hrs (Last 3 readings):   Weight   12/05/18 1814 65.1 kg (143 lb 8.3 oz)   12/05/18 1645 62.6 kg (138 lb)             Last Bowel Movement   Last BM Date: 12/07/18 (per pt)       Problem: Altered GI Function  Goal: Elimination patterns are normal or improving  Outcome: Progressing  Flowsheets (Taken 12/07/2018 2041)  Elimination patterns are normal or improving:   Report abnormal assessment to physician   Assess for normal bowel sounds   Monitor for abdominal discomfort   Assess for flatus   Monitor for abdominal distension    Assess for signs and symptoms of bleeding.  Report signs of bleeding to physician  Goal: Mobility/Activity is maintained at optimal level for patient  Outcome: Progressing  Flowsheets (Taken 12/06/2018 0023 by Mike Gip, RN)  Mobility/activity is maintained at optimal level for patient:   Perform active/passive ROM   Increase mobility as tolerated/progressive mobility   Reposition patient every 2 hours and as needed unless able to reposition self   Plan activities to conserve energy, plan rest periods   Maintain proper body alignment     Problem: Moderate/High Fall Risk Score >5  Goal: Patient will remain free of falls  Outcome: Progressing  Note: Safety precautions reviewed with patient.      Problem: Altered GI Function  Goal: Nutritional intake is adequate  Flowsheets (Taken 12/06/2018 0023 by Mike Gip, RN)  Nutritional intake is adequate:   Assist patient with meals/food selection   Allow adequate time for meals  Note: Patient started on clear liquid diet and tolerating welll

## 2018-12-07 NOTE — Progress Notes (Signed)
PROGRESS  NOTE   GENERAL  SURGERY   Date Time:   12/07/18     11:29 AM  Patient Name:  [name]  Assessment:     --   Recurrent P-SBO.     Improving w/ supportive tx.     Advance to clear liquids.      --   Complex past surgical history is outlined below.      Plan:     --   Clear liquids.    --   Will advance diet as tolerated.    --   Should consider following-up in surgical clinic as outpatient in about 2 weeks following discharge.      Subjective:     The patient provides her own medical history, and she seems like a reliable historian.    The patient is a pleasant 33 year old female who has a complicated past surgical history.   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  In 2011, she finally underwent what sounds like a subtotal colectomy with ileorectal anastomosis.  This surgery was performed in [place], where she was living at the time.  Following this surgery, from about 2011 to 2013, she had multiple significant small bowel obstructions requiring additional surgery.  She reports that she had 5 or 6 explorations for bowel obstruction.    In this timeframe (2011-2013), she required ileostomies x 2.   It sounds like she is describing loop-ileostomies.  Both times, the ileostomies were ultimately reversed.    In addition, the patient has had appendectomy, cholecystectomy, and surgery for an ovarian cyst.    Since 2013, she has had periodic episodes of crampy abdominal discomfort, characteristic of her SBO's.  She has been admitted to hospitals several times for support, but since 2013, she has not required any further abdominal surgery.    She recently moved to this area.  Several days ago, she had another episode of significant abdominal pain, distention, nausea, and vomiting.  She was evaluated in the ER on 12/05/18.  CT scan suggested SBO.  NG tube was placed.  She was admitted and gradually started to improve.  The NG tube has already been removed.    This a.m.,  she reports feeling even more improved.  She still has mild crampy abdominal discomfort.  No further nausea or vomiting.  She is passing bowel movement and flatus.  She is hoping to initiate clear liquids.    The patient's prior available medical records were reviewed.     Labs and CT scan were reviewed.        Physical Exam:     Vitals:    12/07/18 0704   BP: 134/72   Pulse: 62   Resp: 18   Temp: 98.4 F (36.9 C)   SpO2: 98%       Intake and Output Summary (Last 24 hours) at Date Time  No intake or output data in the 24 hours ending 12/07/18 1129      GENERAL:  Awake and cooperative.  Good memory and judgement.    NEURO:  Moves all 4 extremities.    HEENT:  Sclera is white.  Conjunctiva pink.   No icterus.  Atraumatic scalp.      ABDOMEN:  Soft.  Non-distended.  Non-tender.  No hernias.  No abdominal masses.   Current exam is benign.  She has numerous abdominal scars from multiple previous surgeries/ileostomies/drain tubes/etc.      EXTREMITIES:  No pedal edema.  No calf tenderness.   PAS boots in place.        Medications:       Current Facility-Administered Medications   Medication Dose Route Frequency    clonazePAM  0.5 mg Oral BID    enoxaparin  40 mg Subcutaneous Q24H    fluticasone furoate-vilanterol  1 puff Inhalation QAM    LORazepam  1 mg Intravenous Once    nicotine  1 patch Transdermal Daily at 2000    sodium chloride (PF)  3 mL Intravenous Q8H         - - - - - - - - - - - - - - - - - - - - - -         CHEM:     Recent Labs   Lab 12/06/18  0504 12/05/18  1707 12/03/18  2119   Glucose 101* 92 92   Sodium 141 142 138   Potassium 3.3* 4.2 3.7   Chloride 114* 112* 110   CO2 15.9* 17.4* 18.5*   BUN 6* 6* 14   Creatinine 0.68 0.77 0.89   Calcium 8.2* 8.4* 8.3*         CBC:       Recent Labs   Lab 12/06/18  0504 12/05/18  1707 12/03/18  2119   WBC 11.5* 11.3* 11.8*   Hemoglobin 15.1 14.8 15.0   Hematocrit 43.8 42.4 43.7   PLT CT 275 287 290   MCV 98 97 98       BANDS:          POCT:          LFTs:       Recent Labs   Lab 12/05/18  1707 12/03/18  2119   ALT 13 11   AST (SGOT) 21 13   Alkaline Phosphatase 107 105   Bilirubin, Total 0.2 0.4   Albumin 3.8 3.8       COAG:          Lactate:    Recent Labs   Lab 12/06/18  0504   Lactic Acid 1.5           Radiology:  No results found.           Labs:     Results     ** No results found for the last 24 hours. **            Rads:     Radiological Procedure reviewed.    @RAD2DAY @      This patient's care was signed out to me in a "hand-off" process.  Dr. Reggy Eye and I discussed the patient by direct in-person conversation last evening.           Signed by:   Dineen Kid, MD      Although significant efforts were made to ensure accuracy of spelling and grammar, todays note was completed in large part by speech/voice recognition Chemical engineer) software and by keyboarding information into the electronic health care record in real time.  As such, there may be errors in grammar and spelling, insertion of incorrect words/phrases, pronoun errors, etc., that should be disregarded when reviewing this note.    JP

## 2018-12-07 NOTE — Progress Notes (Signed)
Medicine Progress Note   [facility]  [employer]   Patient Name: [name],[name] LOS: 2 days   Attending Physician: Sande Brothers, * PCP: Kathi Simpers, NP   MRN: [id number]                                      CSN: [id number] DOB: [birth year]      Hospital Course:                                                            Labarbara Mehall is a 33 y.o. female patient with PMH including but not limited to short gut syndrome, chronic hepatitis C, Crohn's disease, PCOS, [place] seizures, bipolar disorder, Fibromyalgia, opiate abuse age [age], none since 2013 s/p rehab, Cholecystectomy, Appendectomy, Hysterectomy, colectomy and h/o of multiple bowel obstructions.  Patient returns to [facility] ED today with worsening right sided abdominal pain, intermittent, and increasing nausea worse than her chronic nausea.  Onset of achy right sided abdominal pain ~6 days, worse with food and has not eaten now for x2 days.  Patient usually has numerous BMs (diarrhea upto 12/day) but no BMs x2 days and reports not passing gas as well.  Denies F/c, cough, SOB, chest pain or dizziness.  Patient was seen at [facility] ED on 12/03/18 for similar issue and CT abd/pelvis with contrast showed Ileus with nonspecific malabsorptive process. She was offered to be admitted but she had declined. 12/05/18 AXR: Diffuse gaseous distention of the bowel consistent with either distal colon obstruction or ileus.     Assessment and Plan:      SBO  H/o multiple recurrent ileus/bowel obstruction s/p partial colectomy and partial small bowel resection  - clear liquid diet, advance as tolerated   - NGT removed, general surgery following  - Pain control    H/o Short Gut Syndrome s/p partial colectomy.    S/p partial small bowel resection:  chronic nausea  - manages multiple daily BMs/diarrhea with Lomotil 4 times at home    Non anion gap, hyperchloremic metabolic acidosis  - no prior labs for comparison but this can be  2/2 chronic HC03 loss 2/2 chronic diarrhea 2/2 short gut syndrome.  Patient states she has up to 12 BMs per day.  - Monitor.    H/o Fibromyalgia  - managed with prn Lyrica and  Zanaflex at home-on hold 2/2 illeus/ABO.     History of Hepatitis C:  - f/u with GI when discharged    Bipolar d/o  Generalized anxiety d/o  Insomnia  - takes trazodone 150mg  at home, she requests Ativan while here-will give Ativan  - Cont Klonipin    Tobacco abuse, 1PPd  - counseled  - Nicotine patch    Disposition: home tomorrow  DVT PPX:  Lovenox  Code:  Full Code     Subjective   Abdominal pain still present, Chronic diarrhea not changed from baseline. NO nausea or vomiting.        Objective   Physical Exam:     Vitals: T:98.4 F (36.9 C) (Oral), BP:134/72, HR:62, RR:18, SaO2:98%    General: Patient is awake. In no acute distress.  HEENT: No conjunctival drainage, vision is intact,  anicteric sclera.  Neck: Supple, no thyromegaly.  Chest: CTA bilaterally. No rhonchi, no wheezing. No use of accessory muscles.  CVS: Normal rate and regular rhythm no murmurs, without JVD, no pitting edema, pulses palpable.  Abdomen: Soft, non-tender, no guarding or rigidity, with normal bowel sounds.  Extremities: No calf swelling and no gross deformity.  Skin: Warm, dry, no rash and no worrisome lesions.  NEURO: No motor or sensory deficits.  Psychiatric: Alert, interactive, appropriate, normal affect.  Weight Monitoring 10/22/2018 11/15/2018 12/03/2018 12/03/2018 12/05/2018 12/05/2018 12/05/2018   Height 165.1 cm 165.1 cm 165.1 cm 165.1 cm 165.1 cm 165.1 cm 165.1 cm   Height Method - - - Stated - Stated Stated   Weight 63.957 kg 64.774 kg 63.05 kg 64.1 kg 64.411 kg 62.596 kg 65.1 kg   Weight Method - - - Actual - Stated Bed Scale   BMI (calculated) 23.5 kg/m2 23.8 kg/m2 23.2 kg/m2 23.6 kg/m2 23.7 kg/m2 23 kg/m2 23.9 kg/m2       No intake or output data in the 24 hours ending 12/07/18 1209  Body mass index is 23.88 kg/m.     Meds:     Current  Facility-Administered Medications   Medication Dose Route Frequency    clonazePAM  0.5 mg Oral BID    enoxaparin  40 mg Subcutaneous Q24H    fluticasone furoate-vilanterol  1 puff Inhalation QAM    nicotine  1 patch Transdermal Daily at 2000    sodium chloride (PF)  3 mL Intravenous Q8H       PRN Meds: albuterol, HYDROmorphone, naloxone, ondansetron.     LABS:     Estimated Creatinine Clearance: 106.9 mL/min (based on SCr of 0.68 mg/dL).  Recent Labs   Lab 12/06/18  0504 12/05/18  1707   WBC 11.5* 11.3*   RBC 4.49 4.36   Hemoglobin 15.1 14.8   Hematocrit 43.8 42.4   MCV 98 97   PLT CT 275 287             No results found for: HGBA1CPERCNT  Recent Labs   Lab 12/06/18  0504 12/05/18  1707 12/03/18  2119   Glucose 101* 92 92   Sodium 141 142 138   Potassium 3.3* 4.2 3.7   Chloride 114* 112* 110   CO2 15.9* 17.4* 18.5*   BUN 6* 6* 14   Creatinine 0.68 0.77 0.89   EGFR 116 102 86   Calcium 8.2* 8.4* 8.3*     Recent Labs   Lab 12/05/18  1707 12/03/18  2119   Albumin 3.8 3.8   Protein, Total 7.2 6.7   Bilirubin, Total 0.2 0.4   Alkaline Phosphatase 107 105   ALT 13 11   AST (SGOT) 21 13           Invalid input(s):  AMORPHOUSUA   Patient Lines/Drains/Airways Status    Active PICC Line / CVC Line / PIV Line / Drain / Airway / Intraosseous Line / Epidural Line / ART Line / Line / Wound / Pressure Ulcer / NG/OG Tube     Name:   Placement date:   Placement time:   Site:   Days:    Peripheral IV 12/05/18 22 G Right Forearm   12/05/18    1717    Forearm   less than 1    NG/OG Tube Nasogastric 16 Fr. Right nostril   12/05/18    1757    Right nostril   less than 1  Xr Chest Ap Portable    Result Date: 12/05/2018  NG tube tip in the region of the gastric body. No focal consolidation, pleural effusion, or pneumothorax. Normal heart size. ReadingStation:[email or web address]    Ct Abdomen Pelvis W Iv And Po Cont    Result Date: 12/03/2018  Ileus with nonspecific malabsorptive process. Recommendations: None.  ReadingStation:[email or web address]    Xr Abdomen 2 View With Cxr    Result Date: 12/05/2018  Diffuse gaseous distention of the bowel consistent with either distal colon obstruction or ileus. ReadingStation:[email or web address]     Home Health Needs:  There are no questions and answers to display.       Nutrition assessment done in collaboration with Registered Dietitians:     Time spent:      Sande Brothers, MD     12/07/18,12:09 PM

## 2018-12-07 NOTE — Progress Notes (Signed)
Pt to be placed on Tele #1 in [facility] room 216; report called to Katie at [facility] monitor room.

## 2018-12-07 NOTE — Progress Notes (Addendum)
NURSE NOTE SUMMARY  [facility] - MEDICAL SURGICAL UNIT   Patient Name: [name]   Attending Physician: Sande Brothers, *   Today's date:   12/07/2018 LOS: 2 days   Shift Summary:                                                              0710: resuming care of patient  Assessed patient at change of shift. Completed assessment at . No s/s of distress at this time. Patient denies chest pain and shortness of breath. Safety precautions in place. Call bell within reach. Will continue to monitor.    0740: patient complained of nausea. When I went into the room with her Zofran patient stated that she threw up in the toilet.     0745: patient asked for pain medication.   1344: tech notified this nurse that patient went outside and when she came back she smelled like she had been smoking and room strongly smells of smoke. Nursing supervisor notified.     1909: Report given to [place], RN    Provider Notifications:        Rapid Response Notifications:  Mobility:      PMP Activity: Step 6 - Walks in Room (12/06/2018 11:00 PM)     Weight tracking:  Family Dynamic:   Last 3 Weights for the past 72 hrs (Last 3 readings):   Weight   12/05/18 1814 65.1 kg (143 lb 8.3 oz)   12/05/18 1645 62.6 kg (138 lb)             Last Bowel Movement   Last BM Date: 12/06/18

## 2018-12-08 MED ORDER — VH HYDROMORPHONE HCL PF 1 MG/ML CARPUJECT
0.25 mg | INTRAMUSCULAR | Status: DC | PRN
Start: 2018-12-08 — End: 2018-12-09
  Administered 2018-12-08 – 2018-12-09 (×4): 0.25 mg via INTRAVENOUS
  Filled 2018-12-08 (×4): qty 1

## 2018-12-08 NOTE — Progress Notes (Addendum)
NURSE NOTE SUMMARY  [facility] - MEDICAL SURGICAL UNIT   Patient Name: [name]   Attending Physician: Sande Brothers, *   Today's date:   12/08/2018 LOS: 3 days   Shift Summary:                                                              0710: resuming care of patient   Assessed patient at change of shift. Completed assessment at . No s/s of distress at this time. Patient denies chest pain and shortness of breath. Safety precautions in place. Call bell within reach. Will continue to monitor.    0700: patient requested dilaudid and was informed that it is too early to give it to her.   0900: called telemetry prior to giving dilaudid. HR in 70s    0723: report given to Fleet Contras, RN     Provider Notifications:        Rapid Response Notifications:  Mobility:      PMP Activity: Step 6 - Walks in Room (12/07/2018  7:37 PM)     Weight tracking:  Family Dynamic:   Last 3 Weights for the past 72 hrs (Last 3 readings):   Weight   12/05/18 1814 65.1 kg (143 lb 8.3 oz)   12/05/18 1645 62.6 kg (138 lb)             Last Bowel Movement   Last BM Date: 12/07/18 (per pt)

## 2018-12-08 NOTE — Progress Notes (Signed)
Monitor tech notified that patients heart rate was 39. This happened in a matter of a couple beats where previously she was up in the 90's. Went back to check on patient. Obtained vital signs which were within normal limits with the exception of a heart rate of 41. Patient felt clammy to the touch, and stated that she just felt really restless and could not get comfortable. Nurse notified. Patient denied any further needs at this time.

## 2018-12-08 NOTE — Progress Notes (Signed)
PROGRESS  NOTE   GENERAL  SURGERY   Date Time:   12/08/18     10:13 AM  Patient Name:  [name],[name]  Assessment:     --   Recurrent P-SBO.     Improving w/ supportive tx.     Advance to low-residue regular diet.      --   Complex past surgical history is outlined below.      Plan:     --   Low-residue regular diet.     --   Should consider following-up in surgical clinic as outpatient in about 2 weeks following discharge.      Subjective:     The patient provides her own medical history, and she seems like a reliable historian.    The patient is a pleasant 33 year old female who has a complicated past surgical history.   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  In 2011, she finally underwent what sounds like a subtotal colectomy with ileorectal anastomosis.  This surgery was performed in [place], where she was living at the time.  Following this surgery, from about 2011 to 2013, she had multiple significant small bowel obstructions requiring additional surgery.  She reports that she had 5 or 6 explorations for bowel obstruction.    In this timeframe (2011-2013), she required ileostomies x 2.   It sounds like she is describing loop-ileostomies.  Both times, the ileostomies were ultimately reversed.    In addition, the patient has had appendectomy, cholecystectomy, and surgery for an ovarian cyst.    Since 2013, she has had periodic episodes of crampy abdominal discomfort, characteristic of her SBO's.  She has been admitted to hospitals several times for support, but since 2013, she has not required any further abdominal surgery.    She recently moved to this area.  Several days ago, she had another episode of significant abdominal pain, distention, nausea, and vomiting.  She was evaluated in the ER on 12/05/18.  CT scan suggested SBO.  NG tube was placed.  She was admitted and gradually started to improve.  The NG tube has already been removed.    Date, she feels improved.   She is tolerating clear liquids without difficulty.  She is hoping to have regular food.  She is hoping to be discharged home soon.      No further nausea or vomiting.  She is passing bowel movement and flatus.      The patient's prior available medical records were reviewed.     Labs and CT scan were reviewed.        Physical Exam:     Vitals:    12/08/18 0713   BP: 110/67   Pulse: 85   Resp: 16   Temp: 97.9 F (36.6 C)   SpO2: 97%       Intake and Output Summary (Last 24 hours) at Date Time    Intake/Output Summary (Last 24 hours) at 12/08/2018 1013  Last data filed at 12/07/2018 1706  Gross per 24 hour   Intake 480 ml   Output    Net 480 ml         GENERAL:  Awake and cooperative.  Good memory and judgement.    NEURO:  Moves all 4 extremities.    HEENT:  Sclera is white.  Conjunctiva pink.   No icterus.  Atraumatic scalp.      ABDOMEN:  Soft.  Non-distended.  Non-tender.  No hernias.  No abdominal  masses.   Current exam is benign.  She has numerous abdominal scars from multiple previous surgeries/ileostomies/drain tubes/etc.      EXTREMITIES:  No pedal edema.   No calf tenderness.   PAS boots in place.        Medications:       Current Facility-Administered Medications   Medication Dose Route Frequency    clonazePAM  0.5 mg Oral BID    enoxaparin  40 mg Subcutaneous Q24H    fluticasone furoate-vilanterol  1 puff Inhalation QAM    nicotine  1 patch Transdermal Daily at 2000    sodium chloride (PF)  3 mL Intravenous Q8H         - - - - - - - - - - - - - - - - - - - - - -         CHEM:     Recent Labs   Lab 12/07/18  2045 12/06/18  1308 12/05/18  1707 12/03/18  2119   Glucose 104* 101* 92 92   Sodium 138 141 142 138   Potassium 3.8 3.3* 4.2 3.7   Chloride 108 114* 112* 110   CO2 18.8* 15.9* 17.4* 18.5*   BUN 6* 6* 6* 14   Creatinine 0.70 0.68 0.77 0.89   Calcium 8.6 8.2* 8.4* 8.3*   Magnesium 1.8  --   --   --    Phosphorus 2.4  --   --   --          CBC:       Recent Labs   Lab 12/06/18  0504 12/05/18  1707  12/03/18  2119   WBC 11.5* 11.3* 11.8*   Hemoglobin 15.1 14.8 15.0   Hematocrit 43.8 42.4 43.7   PLT CT 275 287 290   MCV 98 97 98       BANDS:          POCT:          LFTs:      Recent Labs   Lab 12/07/18  2045 12/05/18  1707 12/03/18  2119   ALT  --  13 11   AST (SGOT)  --  21 13   Alkaline Phosphatase  --  107 105   Bilirubin, Total  --  0.2 0.4   Albumin 3.4* 3.8 3.8       COAG:          Lactate:    Recent Labs   Lab 12/06/18  0504   Lactic Acid 1.5           Radiology:  No results found.           Labs:     Results     Procedure Component Value Units Date/Time    Troponin I [[id number]] Collected: 12/07/18 2045    Specimen: Plasma Updated: 12/07/18 2148     Troponin I <0.01 ng/mL     Renal function panel [[id number]]  (Abnormal) Collected: 12/07/18 2045    Specimen: Plasma Updated: 12/07/18 2141     Sodium 138 mMol/L      Potassium 3.8 mMol/L      Chloride 108 mMol/L      CO2 18.8 mMol/L      Calcium 8.6 mg/dL      Glucose 401 mg/dL      Creatinine 0.27 mg/dL      BUN 6 mg/dL      Albumin 3.4 gm/dL      Phosphorus 2.4 mg/dL  Anion Gap 15.0 mMol/L      BUN / Creatinine Ratio 8.6 Ratio      EGFR 115 mL/min/1.64m2      Osmolality Calculated 274 mOsm/kg     Magnesium [[id number]] Collected: 12/07/18 2045    Specimen: Plasma Updated: 12/07/18 2141     Magnesium 1.8 mg/dL             Rads:     Radiological Procedure reviewed.    @RAD2DAY @      This patient's care was signed out to me in a "hand-off" process.  Dr. Reggy Eye and I discussed the patient by direct in-person conversation last evening.           Signed by:   Dineen Kid, MD      Although significant efforts were made to ensure accuracy of spelling and grammar, todays note was completed in large part by speech/voice recognition Chemical engineer) software and by keyboarding information into the electronic health care record in real time.  As such, there may be errors in grammar and spelling, insertion of incorrect words/phrases, pronoun errors, etc., that  should be disregarded when reviewing this note.    JP

## 2018-12-08 NOTE — Progress Note - Problem Oriented Charting Notewrit (Signed)
MEDICINE PROGRESS NOTE    Date Time: 12/08/18 6:49 PM  Patient Name: [name],[name]  Attending Physician: Sande Brothers, *      Subjective     CC: Abdominal pain    HPI/Subjective: Diet was advanced today, to a low residual, by general surgery.  The patient has a history of short gut syndrome, chronic hepatitis C, Crohn's disease, multiple episodes of bowel obstruction.  Abdominal pain has returned to baseline.  The patient is passing bowel movements and flatus.    Hospital course to date:  [name] a 33 y.o.femalepatientwith PMH including but not limited toshort gutsyndrome,chronic hepatitis C, Crohn's disease, PCOS, [place] seizures, bipolar disorder,Fibromyalgia, opiate abuse age [age], none since 2013 s/p rehab,Cholecystectomy,Appendectomy,Hysterectomy, colectomy and h/o ofmultiplebowel obstructions.  Patient returns to [facility] ED today with worsening right sided abdominal pain, intermittent, and increasing nausea worse than her chronic nausea. Onset of achy right sided abdominal pain ~6 days, worse with food and has not eaten now for x2 days. Patient usually has numerous BMs (diarrhea upto 12/day) but no BMs x2 days and reports not passing gas as well. Denies F/c, cough, SOB, chest pain or dizziness. Patient was seen at [facility] ED on 12/03/18 for similar issue and CT abd/pelvis with contrast showed Ileus with nonspecific malabsorptive process.She was offered to be admitted but she had declined. 12/05/18 [id number] gaseous distention of the bowel consistent with either distal colon obstruction or ileus.    Review of Systems:     Review of systems as noted in HPI. Full 14 point ROS otherwise negative.  Changes as outlined above    Physical Exam:     Patient Vitals for the past 24 hrs:   BP Temp Temp src Pulse Resp SpO2   12/08/18 1438    65 16 95 %   12/08/18 1321 103/66 98.2 F (36.8 C) Oral (!) 55 18 95 %   12/08/18 0713 110/67 97.9 F (36.6 C) Oral  85 16 97 %   12/08/18 0356    (!) 56  94 %   12/08/18 0356 120/83   (!) 55  95 %   12/08/18 0200 120/83 98.2 F (36.8 C) Tympanic  16 95 %   12/07/18 1937 121/67   (!) 47  98 %       General: awake, alert, no acute distress.  EYES:  perrla, eomi, sclera anicteric   HENT:  Head atraumatic, oropharynx clear without lesions, mucous membranes moist  Neck: supple, no lymphadenopathy, no thyromegaly, no JVD, no carotid bruits  Cardiovascular: regular rate and rhythm,normal S1,S2 no murmurs, rubs or gallops, normal palpation  Lungs: clear to auscultation bilaterally, without wheezing, rhonchi, or rales, nomal palpation, normal respiratory effort  Abdomen: soft, non-tender, non-distended; no palpable masses, no hepatosplenomegaly, normoactive bowel sounds, no rebound or guarding  Extremities: no clubbing, cyanosis, or edema, normal flexion and extension  Neuro: cranial nervesII-XII grossly intact, strength grossly normal in upper and lower extremities, sensation grossly intact, mental status intact, gait normal, reflexes normal  Skin: warm and dry, no rashes or lesions noted  Psychiatric:   normal affect and orientation    Meds:     Current Facility-Administered Medications   Medication Dose Route Frequency    clonazePAM  0.5 mg Oral BID    enoxaparin  40 mg Subcutaneous Q24H    fluticasone furoate-vilanterol  1 puff Inhalation QAM    nicotine  1 patch Transdermal Daily at 2000    sodium chloride (PF)  3  mL Intravenous Q8H     albuterol, HYDROmorphone, naloxone, ondansetron      Labs:     Results     Procedure Component Value Units Date/Time    Troponin I [[id number]] Collected: 12/07/18 2045    Specimen: Plasma Updated: 12/07/18 2148     Troponin I <0.01 ng/mL     Renal function panel [[id number]]  (Abnormal) Collected: 12/07/18 2045    Specimen: Plasma Updated: 12/07/18 2141     Sodium 138 mMol/L      Potassium 3.8 mMol/L      Chloride 108 mMol/L      CO2 18.8 mMol/L      Calcium 8.6 mg/dL      Glucose 782  mg/dL      Creatinine 9.56 mg/dL      BUN 6 mg/dL      Albumin 3.4 gm/dL      Phosphorus 2.4 mg/dL      Anion Gap 21.3 mMol/L      BUN / Creatinine Ratio 8.6 Ratio      EGFR 115 mL/min/1.38m2      Osmolality Calculated 274 mOsm/kg     Magnesium [[id number]] Collected: 12/07/18 2045    Specimen: Plasma Updated: 12/07/18 2141     Magnesium 1.8 mg/dL             Radiology:     Radiology Results (24 Hour)     ** No results found for the last 24 hours. **            Assessment:   Active Problems:    Small bowel obstruction  Resolved Problems:    * No resolved hospital problems. *          Plan:   1.             SBO  H/o multiple recurrent ileus/bowel obstruction s/p partial colectomy and partial small bowel resection  - Diet advanced to low residual, per general surgery  - NGT removed, general surgery following  - Pain control, will titrate Dilaudid back to 0.25 mg IV every 4 hours as necessary.    H/oShort Gut Syndromes/p partial colectomy.   S/p partial small bowel resection:  chronic nausea  - manages multiple daily BMs/diarrhea withLomotil 4 timesat home    Non anion gap, hyperchloremic metabolic acidosis  - no prior labs for comparison but this can be 2/2 chronic HC03 loss 2/2 chronic diarrhea 2/2 short gut syndrome. Patient states she has up to 12 BMs per day.  - Monitor.    H/oFibromyalgia  - managed withprnLyrica and Zanaflex at home-on hold 2/2 illeus/ABO.     History of Hepatitis C:  - f/u withGIwhen discharged    Bipolar d/o  Generalized anxiety d/o  Insomnia  - takestrazodone 150mg at home, she requests Ativan while here-will give Ativan  - Cont Klonipin    Tobacco abuse, 1PPd  - counseled  - Nicotine patch    Disposition: home tomorrow  DVT PPX:  Lovenox  Code:            Full Code              Signed by: Charolotte Eke, MD

## 2018-12-08 NOTE — Progress Notes (Signed)
12/08/18 1438   Patient Assessment   Status Completed   RT Priority 3   Subjective no resp distress   Pulmonary History Other (Comment)  (smoker)   Surgical Status None   Mobility Ambulatory with or without assistance   Cough Non-productive   Heart Rate 65   Resp Rate 16   Bilateral Breath Sounds Clear   Oxygen Therapy   SpO2 95 %   O2 Device None (Room air)   Criteria for Therapy   Secretion Clearance (BPH) None   Lung Expansion None   Respiratory Medications Patient receives maintenance therapy at home   Oxygen None   Outcomes   Secretion Clearance (BPH)  Patient on maintenance or home regimen   Lung expansion  Patient on maintenance or home regimen   Respiratory medication Patient is on maintenance or home regimen   Oxygen goals Maintain room air SpO2 > 90%   Recommendations   Secretion Clearance (BPH) Continue current therapy   Lung Expansion Continue current therapy   Respiratory Medications Continue current therapy   Comments   Comments Reassess 11/18   Performing Departments   O2 Device performing department formula [id number]   Resp assess adult performing department formula [id number]

## 2018-12-09 DIAGNOSIS — K56600 Partial intestinal obstruction, unspecified as to cause: Secondary | ICD-10-CM

## 2018-12-09 MED ORDER — HYDROCODONE-ACETAMINOPHEN 5-325 MG PO TABS
1.0000 | ORAL_TABLET | Freq: Four times a day (QID) | ORAL | 0 refills | Status: DC | PRN
Start: 2018-12-09 — End: 2019-01-13

## 2018-12-09 NOTE — Plan of Care (Addendum)
NURSE NOTE SUMMARY  [facility] - MEDICAL SURGICAL UNIT   Patient Name: [name]   Attending Physician: Sande Brothers, *   Today's date:   12/09/2018 LOS: 4 days   Shift Summary:                                                              2239-administered dilaudid 0.25 mg IV for 9/10 right sided abdominal pain.  zofran administered as well. Heart rate in the 40s. Sinus brady. Abdomen soft and has active bowel sounds. Patient states that she has had 3-4 bowel movements today.     0000-woke patient to obtain vitals. heart rate in 40s. Patient asymptomatic. States her pain is down to 7/10.    0252-patient complaining of 9/10 right sided abdominal pain and back pain. Dilaudid administered. Patient states she is restless.     0347-checked on patient. Patient resting with eyes closed.      Provider Notifications:        Rapid Response Notifications:  Mobility:      PMP Activity: Step 6 - Walks in Room (12/08/2018 10:39 PM)     Weight tracking:  Family Dynamic:   No data found.          Last Bowel Movement   Last BM Date: 12/08/18        Problem: Altered GI Function  Goal: Elimination patterns are normal or improving  Outcome: Progressing  Flowsheets (Taken 12/09/2018 0038)  Elimination patterns are normal or improving:   Report abnormal assessment to physician   Anticipate/assist with toileting needs   Assess for normal bowel sounds   Monitor for abdominal distension   Monitor for abdominal discomfort   Assess for flatus  Note: Patient stated she had 3-4 bowel movements today. Abdomen soft and active bowel sounds.

## 2018-12-09 NOTE — Discharge Instr - AVS First Page (Signed)
Low-fiber diet    Fiber is the part of fruits, vegetables, and grains (the roughage) that is not digested and absorbed by your body.  With a low-fiber diet, you are not eating much of these fibers.  Your stools will become smaller and less bulky as less roughage is passing through your digestive system.        A low-fiber diet may be recommended for a number of conditions or situations.  It is sometimes called a 'low-residue diet' or a 'restricted-fiber diet.'       Purpose  Your doctor may prescribe a low-fiber diet if:   You have narrowing of the bowel due to a tumor or an inflammatory disease     (such as diverticulitis or colitis)   You have had bowel surgery   You are having treatment, such as radiation, that damages or irritates your     digestive tract    As your digestive system returns to normal, usually you can slowly add more fiber back into your diet.       Diet details  A low-fiber diet limits the types of vegetables, fruits, and grains that you should eat.   If you're eating a low-fiber diet, be sure to read food labels.  Foods you might not expect -- such as yogurt, ice cream, cereal and even beverages -- can have added fiber.      Look for foods that have no more than 1 gram of fiber in a serving.      Foods that are generally allowed on a low-fiber diet include:   White bread, without nuts and seeds   White rice, plain white pasta, and crackers   Refined hot cereals, such as Cream of Wheat, or cold cereals with less than 1 gram of fiber per serving   Pancakes or waffles made from white refined flour   Most canned or well-cooked vegetables and fruits without skins or seeds   Fruit and vegetable juice with little or no pulp, fruit-flavored drinks, and flavored waters   Tender meat, poultry, fish, eggs and tofu   Milk and foods made from milk -- such as yogurt, pudding, ice cream, cheeses and sour cream -- if tolerated   Butter, margarine, oils and salad dressings without  seeds      You should avoid:   Whole-wheat or whole-grain breads, cereals, and pasta   Brown or wild rice    Whole grains, such as oats, kasha, barley, and quinoa   Dried fruits and prune juice   Raw fruit, including those with seeds, skin, or membranes, such as berries   Raw or undercooked vegetables, including corn   Dried beans, peas, and lentils   Seeds and nuts, and foods containing them, including peanut butter and other nut butters   Coconut   Popcorn      If you're eating a low-fiber diet, a typical menu might look like this.     Breakfast  1 glass milk, if tolerated  1 egg  1 slice of white toast with smooth jelly  1/2 cup canned peaches    Snack  1 cup of yogurt if tolerated, without seeds or nuts    Lunch  1 to 2 cups of chicken noodle soup  Crackers  Sandwich of drained tuna with mayonnaise on white bread  Canned applesauce  Flavored water or iced tea    Snack  White toast, bread or crackers  2 slices of cheese or 1/2 cup cottage cheese, if  tolerated  Flavored water    Dinner  3 ounces lean meat, poultry or fish  1/2 cup white rice  1/2 cup cooked vegetables, such as carrots or green beans  White dinner roll with butter  Hot tea      Prepare all foods so that they're tender.  Good cooking methods include simmering, poaching, stewing, steaming and braising.  Baking or microwaving in a covered dish is another option.      Try to avoid roasting, broiling and grilling -- methods that tend to make foods dry and tough.  You may also want to avoid fried foods and spices.      While on a low-fiber diet, you will probably have fewer bowel movements.  Also, your stools may be smaller in size.  This is the goal of the low-fiber diet -- to limit the amount of stool that passes through the digestive system.      To avoid constipation, you may need to drink extra fluids.  Drink plenty of water, unless your doctor tells you otherwise.      Results  Eating a low-fiber diet will limit your bowel movements and  help ease diarrhea or other symptoms of abdominal conditions, such as abdominal pain.  Once your digestive system has returned to normal, you can slowly reintroduce fiber into your diet.       Risks  Because a low-fiber diet restricts what you can eat, it can be difficult to meet your long-term nutritional needs.  You should use a low-fiber diet only as long as directed by your doctor -- typically this is only for several weeks, while your digestive system has time to improve.  If you must continue eating this diet for a longer time, consult a registered dietitian to make sure your nutritional needs are being met.    Selected phone numbers:    --  My office:       [phone]    --  [facility]:    [phone]  --  [facility]:     [phone]  --  [facility]:  [phone]      The information provided here is for general informational purposes only, and was not designed to diagnose or treat a health problem or disease, or replace the professional medical advice you receive from your doctor.  Please consult your health care provider with any questions or concerns you may have regarding your condition.

## 2018-12-09 NOTE — Progress Notes (Addendum)
NURSE NOTE SUMMARY  [facility] - MEDICAL SURGICAL UNIT   Patient Name: [name]   Attending Physician: Sande Brothers, *   Today's date:   12/09/2018 LOS: 4 days   Shift Summary:                                                              0740 Pt pain 8/10. Requested PRN pain medication. Shift assessment complete. Pt had several loose BM's overnight. Pt eager to go home. Asking when surgeon with be in. Will contact MD.  Pt also asking if she can go to the chapel or on a walk. Let pt know that she could walk in the hallways of the unit with her mask on but was not allowed to leave unit.  No nicotine patch in place. See MAR.  0930 Let pt know MD will be in the morning and then can possibly go home after that. Pt agreeable.       Provider Notifications:        Rapid Response Notifications:  Mobility:      PMP Activity: Step 7 - Walks out of Room (12/09/2018  7:40 AM)     Weight tracking:  Family Dynamic:   No data found.          Last Bowel Movement   Last BM Date: 12/08/18

## 2018-12-09 NOTE — Discharge Summary (Signed)
Medicine Discharge Summary   [facility]  [employer]   Patient Name: [name]   Attending Physician: No att. providers found PCP: Kathi Simpers, NP   Date of Admission: 12/05/2018 D/C Date: 12/09/2018   Discharge Diagnoses:     1.  Small bowel obstruction, resolved  2.  History of short gut syndrome, s/p partial colectomy and partial small bowel resection, with chronic nausea  3.  Non-anion gap hyperchloremic metabolic acidosis, due to chronic diarrhea from short gut syndrome  4.  History of fibromyalgia  5.  History of hepatitis C, follow-up with GI after discharge  6.  Bipolar disorder  7.  Generalized anxiety disorder with insomnia  8.  Tobacco abuse, counseled     Hospital Course       History of present illness, per the admitting physician:  [name] is a 33 y.o. female patient that was admitted on 12/05/2018 with PMH including but not limited to short gut syndrome, chronic hepatitis C, Crohn's disease, PCOS, [place] seizures, bipolar disorder, Fibromyalgia, opiate abuse age [age], none since 2013 s/p rehab, Cholecystectomy, Appendectomy, Hysterectomy, colectomy and h/o of multiple bowel obstructions.  Patient returns to [facility] ED today with worsening right sided abdominal pain, intermittent, and increasing nausea worse than her chronic nausea.  Onset of achy right sided abdominal pain ~6 days, worse with food and has not eaten now for x2 days.  Patient usually has numerous BMs (diarrhea upto 12/day) but no BMs x2 days and reports not passing gas as well.  Denies F/c, cough, SOB, chest pain or dizziness.  Patient was seen at [facility] ED on 12/03/18 for similar issue and CT abd/pelvis with contrast showed Ileus with nonspecific malabsorptive process. She was offered to be admitted but she had declined.    Hospital course: The patient was admitted, to the medical surgical unit.  Nasogastric suction was placed..  Pain and nausea steadily improved.  Nasogastric  suction was discontinued, and diet slowly advanced.  By 12/09/2018, the patient was tolerating a low residual diet well.  At this time, general surgery assessed the patient is being stable for discharge.       Pending Results and other significant studies: Not applicable    On follow-up, reassess gastrointestinal status.  General surgery wants to see the patient in follow-up in 1 to 2 weeks.  Consider GI referral, for hepatitis C.         Discharge Instructions:          Disposition: Discharged home  Diet: Low residual  Activity: As tolerated  Discharge Code Status: Full Code    Kathi Simpers, NP  [address] [place] [address]  [phone]    Follow up in 1 week(s)      Laurell Roof Roselle Locus, MD  [address] [place] [address]  [phone]    Follow up in 2 week(s)         Discharge Medications:                                                                        Discharge Medication List      Taking    albuterol sulfate HFA  108 (90 Base) MCG/ACT inhaler  Dose: 2 puff  Commonly known as: PROVENTIL  Inhale 2 puffs into the lungs every 4 (four) hours as needed for Wheezing or Shortness of Breath     clonazePAM 0.5 MG tablet  Dose: 0.5 mg  Commonly known as: KlonoPIN  Take 1 tablet (0.5 mg total) by mouth 2 (two) times daily This is to be taken over by psychiatry on Nov 25, 2018     diphenoxylate-atropine 2.5-0.025 MG per tablet  Dose: 2 tablet  Commonly known as: Lomotil  Take 2 tablets by mouth 4 (four) times daily     fluticasone-salmeterol 230-21 MCG/ACT inhaler  Dose: 2 puff  Commonly known as: ADVAIR HFA  Inhale 2 puffs into the lungs 2 (two) times daily     HYDROcodone-acetaminophen 5-325 MG per tablet  Dose: 1 tablet  Commonly known as: NORCO  Take 1 tablet by mouth every 6 (six) hours as needed for Pain     ibuprofen 800 MG tablet  Dose: 800 mg  Commonly known as: ADVIL  Take 1 tablet (800 mg total) by mouth every 8 (eight) hours as needed for Pain     ondansetron 4 MG disintegrating  tablet  Dose: 4 mg  Commonly known as: Zofran ODT  Take 1 tablet (4 mg total) by mouth every 8 (eight) hours as needed for Nausea     pregabalin 100 MG capsule  Dose: 100 mg  Commonly known as: LYRICA  Take 1 capsule (100 mg total) by mouth 2 (two) times daily     tiZANidine 4 MG tablet  Dose: 8 mg  Commonly known as: ZANAFLEX  Take 2 tablets (8 mg total) by mouth every 8 (eight) hours as needed (fibromyalgia pain)     traZODone 150 MG tablet  Commonly known as: DESYREL  Take 1.5 -2 tabs PO PRN at HS.          Please note that 14 hydrocodone/acetaminophen were prescribed, for residual abdominal pain.  The patient reports that she already has Narcan rescue inhalers available     Discharge Day Exam (12/09/2018):     Blood pressure 101/60, pulse 84, temperature 97.7 F (36.5 C), temperature source Oral, resp. rate 18, height 1.651 m (5\' 5" ), weight 65.1 kg (143 lb 8.3 oz), last menstrual period 11/13/2018, SpO2 99 %.      General: Patient is awake. In no acute distress.  Chest: CTA bilaterally. No rhonchi, no wheezing. No use of accessory muscles.  CVS: Normal rate and regular rhythm no murmurs, without JVD.  Abdomen: Soft, slightly tender right flank, no rebound or guarding, no guarding or rigidity, with normal bowel sounds.  Extremities: No pitting edema, pulses palpable, no calf swelling and no gross deformity.  Skin: Warm, dry  NEURO: No motor or sensory deficits.     Recent Labs      Recent Labs   Lab 12/06/18  0504 12/05/18  1707 12/03/18  2119   WBC 11.5* 11.3* 11.8*   RBC 4.49 4.36 4.46   Hemoglobin 15.1 14.8 15.0   Hematocrit 43.8 42.4 43.7   MCV 98 97 98   PLT CT 275 287 290         Recent Labs   Lab 12/07/18  2045   Troponin I <0.01     No results found for: HGBA1CPERCNT  Recent Labs   Lab 12/07/18  2045 12/06/18  0504 12/05/18  1707 12/03/18  2119   Glucose 104* 101* 92 92  Sodium 138 141 142 138   Potassium 3.8 3.3* 4.2 3.7   Chloride 108 114* 112* 110   CO2 18.8* 15.9* 17.4* 18.5*   BUN 6* 6* 6* 14    Creatinine 0.70 0.68 0.77 0.89   EGFR 115 116 102 86   Calcium 8.6 8.2* 8.4* 8.3*     Recent Labs   Lab 12/07/18  2045 12/05/18  1707 12/03/18  2119   Magnesium 1.8  --   --    Phosphorus 2.4  --   --    Albumin 3.4* 3.8 3.8   Protein, Total  --  7.2 6.7   Bilirubin, Total  --  0.2 0.4   Alkaline Phosphatase  --  107 105   ALT  --  13 11   AST (SGOT)  --  21 13        Allergies:      Fentanyl, Reglan [metoclopramide], Rocephin [ceftriaxone], Tramadol, and Vancomycin   Time spent on discharging the patient: 40 minutes   Xr Chest Ap Portable    Result Date: 12/05/2018  NG tube tip in the region of the gastric body. No focal consolidation, pleural effusion, or pneumothorax. Normal heart size. ReadingStation:[email or web address]    Ct Abdomen Pelvis W Iv And Po Cont    Result Date: 12/03/2018  Ileus with nonspecific malabsorptive process. Recommendations: None. ReadingStation:[email or web address]    Xr Abdomen 2 View With Cxr    Result Date: 12/05/2018  Diffuse gaseous distention of the bowel consistent with either distal colon obstruction or ileus. ReadingStation:[email or web address]     Home Health Needs:  There are no questions and answers to display.      Charolotte Eke, MD         12/09/18 2:32 PM   MRN: [id number]                                      CSN: [id number] DOB: 13-Jul-1985

## 2018-12-09 NOTE — Progress Notes (Signed)
PROGRESS  NOTE   GENERAL  SURGERY   Date Time:   12/09/18     9:33 AM  Patient Name:  [name],[name]  Assessment:     --   Recurrent P-SBO.     Improving w/ supportive tx.     Advance to low-residue regular diet.      --   Complex past surgical history is outlined below.      Plan:     --   Low-residue regular diet.     --   Should consider following-up in surgical clinic as outpatient in about 2 weeks following discharge.      --   Seems stable for discharge from surgical standpoint.       Subjective:     The patient provides her own medical history, and she seems like a reliable historian.    The patient is a pleasant 33 year old female who has a complicated past surgical history.   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  In 2011, she finally underwent what sounds like a subtotal colectomy with ileorectal anastomosis.  This surgery was performed in [place], where she was living at the time.  Following this surgery, from about 2011 to 2013, she had multiple significant small bowel obstructions requiring additional surgery.  She reports that she had 5 or 6 explorations for bowel obstruction.    In this timeframe (2011-2013), she required ileostomies x 2.   It sounds like she is describing loop-ileostomies.  Both times, the ileostomies were ultimately reversed.    In addition, the patient has had appendectomy, cholecystectomy, and surgery for an ovarian cyst.    Since 2013, she has had periodic episodes of crampy abdominal discomfort, characteristic of her SBO's.  She has been admitted to hospitals several times for support, but since 2013, she has not required any further abdominal surgery.    She recently moved to this area.  Several days ago, she had another episode of significant abdominal pain, distention, nausea, and vomiting.  She was evaluated in the ER on 12/05/18.  CT scan suggested SBO.  NG tube was placed.  She was admitted and gradually started to improve.      - -  - - - -     Today, she feels improved.  She is tolerating low-residue diet.      No further nausea or vomiting.  She is passing bowel movement and flatus.      The patient's prior available medical records were reviewed.     Labs and CT scan were reviewed.        Physical Exam:     Vitals:    12/09/18 0731   BP:    Pulse: 84   Resp: 18   Temp:    SpO2: 99%       Intake and Output Summary (Last 24 hours) at Date Time    Intake/Output Summary (Last 24 hours) at 12/09/2018 0933  Last data filed at 12/08/2018 1700  Gross per 24 hour   Intake 240 ml   Output --   Net 240 ml         GENERAL:  Awake and cooperative.  Good memory and judgement.    NEURO:  Moves all 4 extremities.    HEENT:  Sclera is white.  Conjunctiva pink.   No icterus.  Atraumatic scalp.      ABDOMEN:  Soft.  Non-distended.  Non-tender.  No hernias.  No abdominal masses.  Current exam is benign.  She has numerous abdominal scars from multiple previous surgeries/ileostomies/drain tubes/etc.        Medications:       Current Facility-Administered Medications   Medication Dose Route Frequency    clonazePAM  0.5 mg Oral BID    enoxaparin  40 mg Subcutaneous Q24H    fluticasone furoate-vilanterol  1 puff Inhalation QAM    nicotine  1 patch Transdermal Daily at 2000    sodium chloride (PF)  3 mL Intravenous Q8H         - - - - - - - - - - - - - - - - - - - - - -         CHEM:     Recent Labs   Lab 12/07/18  2045 12/06/18  1610 12/05/18  1707 12/03/18  2119   Glucose 104* 101* 92 92   Sodium 138 141 142 138   Potassium 3.8 3.3* 4.2 3.7   Chloride 108 114* 112* 110   CO2 18.8* 15.9* 17.4* 18.5*   BUN 6* 6* 6* 14   Creatinine 0.70 0.68 0.77 0.89   Calcium 8.6 8.2* 8.4* 8.3*   Magnesium 1.8  --   --   --    Phosphorus 2.4  --   --   --          CBC:       Recent Labs   Lab 12/06/18  0504 12/05/18  1707 12/03/18  2119   WBC 11.5* 11.3* 11.8*   Hemoglobin 15.1 14.8 15.0   Hematocrit 43.8 42.4 43.7   PLT CT 275 287 290   MCV 98 97 98       BANDS:           POCT:          LFTs:      Recent Labs   Lab 12/07/18  2045 12/05/18  1707 12/03/18  2119   ALT  --  13 11   AST (SGOT)  --  21 13   Alkaline Phosphatase  --  107 105   Bilirubin, Total  --  0.2 0.4   Albumin 3.4* 3.8 3.8       COAG:          Lactate:    Recent Labs   Lab 12/06/18  0504   Lactic Acid 1.5           Radiology:  No results found.           Labs:     Results     ** No results found for the last 24 hours. **            Rads:     Radiological Procedure reviewed.    @RAD2DAY @      Signed by:   Dineen Kid, MD      Although significant efforts were made to ensure accuracy of spelling and grammar, todays note was completed in large part by speech/voice recognition [employer] engineer) software and by keyboarding information into the electronic health care record in real time.  As such, there may be errors in grammar and spelling, insertion of incorrect words/phrases, pronoun errors, etc., that should be disregarded when reviewing this note.    JP

## 2018-12-09 NOTE — Progress Notes (Signed)
Called to set up a F/U appt for patient and there was already one set up for Tuesday, November 24th, at 10:00.

## 2018-12-09 NOTE — Progress Notes (Addendum)
Pt educated on discharge packet including diet advancement, new medications and f/u appointments. Pt escorted from unit in wheelchair and home in private vehicle.

## 2018-12-09 NOTE — Progress Notes (Signed)
Called and scheduled a F/U with Dr. Laurell Roof for Tuesday, November 24th at 1:00.

## 2018-12-10 LAB — ECG 12-LEAD
P Wave Axis: 18 deg
P-R Interval: 106 ms
Patient Age: 33 years
Q-T Interval(Corrected): 403 ms
Q-T Interval: 451 ms
QRS Axis: 78 deg
QRS Duration: 88 ms
T Axis: 73 years
Ventricular Rate: 48 //min

## 2018-12-11 ENCOUNTER — Telehealth: Payer: Self-pay

## 2018-12-11 NOTE — Telephone Encounter (Signed)
Discharge phone call placed to patient, she states she was able to pick up all medications from pharmacy, but she has no pain relief from Norco. She states dilaudid usually works better with previous bowel obstructions. She denies any questions about discharge instructions, states she is able to eat and drink and is having bowel movements. Will call general surgery to see if she can get an earlier follow up appointment.

## 2018-12-11 NOTE — Progress Notes (Addendum)
[facility]   General Surgery Department  History and Physical    Patient Name:   [name]    Date Time:    12/12/2018     11:08 AM    Evaluating Physician:    Dineen Kid, MD  Primary Care Physician:    Kathi Simpers, NP    Assessment:     --   Recurrent partial SBO's.      Has ongoing sx's.             --   Complex past surgical history     Is outlined below.      --   Reports hx Crohn's disease.            Plan:     --   Check SBFT.      --   F/u in one week to discuss results.      --   Refer to medical center.      Patient wishes to go to [facility].          ADDENDUM:  12/13/18  15:09  Received a message/response from one of the surgeons in [place].  They recommended that this patient be evaluated by a colorectal surgeon.       I spoke with the patient by telephone at [phone].  We had a discussion regarding referral to the colorectal surgeons at [facility].  The patient agrees with this plan.      Order placed for referral to colorectal surgeons at [facility].      JP          History of Presenting Illness:     The patient provides her own medical history, and she seems like a reliable historian.      The patient is a pleasant 33 year old female who has a complicated past surgical history.     She reports a hx of Crohn's disease?        She also has hx of intractable colonic inertia.  In 2011, she underwent what sounds like a subtotal colectomy with ileorectal anastomosis.  This surgery was performed in [place], where she was living at the time.  Following this surgery, from about 2011 to 2013, she had multiple significant small bowel obstructions requiring additional surgery.  She reports that she had 5 or 6 explorations for bowel obstruction.      In this timeframe (2011-2013), she required ileostomies x 2.    It sounds like she is describing loop-ileostomies.  Both times, the ileostomies were ultimately reversed.      In addition, the patient has had appendectomy, cholecystectomy, and  surgery for an ovarian cyst.      Since 2013, she has had periodic episodes of crampy abdominal discomfort, characteristic of her SBO's.  She has been admitted to hospitals several times for support, but since 2013, she has not required any further abdominal surgery.      She recently moved to this area.      For the past several weeks, she has been experiencing frequent episodes of more significant abdominal pain, distention, nausea, and vomiting.  She feels like her bowel obstructions are becoming more problematic.  She has been trying to stay on a low-residue diet.  Even with this diet, she is having difficulty.       - - - - - -       She was recently admitted to [facility] (on 12/05/18).   CT scan suggested SBO.  NG tube was placed.    She gradually improved with supportive therapy.  She follows-up in the office today indicating that her GI symptoms have returned, and they are quite debilitating.  She wishes to pursue further intervention.  She is hoping to accomplish abdominal exploration with correction of SBO.       She reports having regular BMs.  It sounds like she has partial GI-obstructive symptoms.       - - - - - -       The patient's prior available medical records were reviewed.       Labs and CT scan were reviewed.        Abdominal pain:    Location --   worse on the right side.  Duration --   worsening over several weeks.  Timing --   all the time.  Quality --   crampy and achy, feels bloated and distended.  Severity --   severe.  Context --   also has nausea and vomiting.  Mod Factors --   eating makes symptoms worse.  Associated Signs & Symptoms --   has pain, nausea, and some vomiting.        - - - - - -       A total of 70 minutes were spent generating this document -- including time spent face-to-face with the patient, reviewing the patient's medical records, labs, and pertinent imaging, and completing this encounter -- and over half of that time was spent on counseling and coordination of the  patient's care.           Physical Exam:     BP 109/63 (BP Site: Right arm, Patient Position: Sitting)    Pulse 79    Ht 1.651 m (5\' 5" )    Wt 63.6 kg (140 lb 3.2 oz)    LMP 11/13/2018 (Approximate)    SpO2 99%    BMI 23.33 kg/m     CONSTITUTIONAL:    No acute distress.   Does not appear toxic.      PSYCH:   Awake and cooperative.  Good memory and judgement.      NEURO:   Moves all 4 extremities.  Ambulatory.      EYES:    Sclera are white.  Conjunctiva pink.   No icterus.      ENMT:   Atraumatic scalp.   Neck is supple.      CARDIOVASC:   Regular.  Normal cardiac sounds.  No murmur.      RESP:   Clear bilaterally.  No wheezes.   No costal margin tenderness.      GI:   Soft.  Non-distended.    Has tenderness on the right side of her abdomen, at a previous ileostomy incision.    No abdominal masses.   She has numerous abdominal scars from multiple previous surgeries/ileostomies/drain tubes/etc.      MS:   Normal strength and muscle tone.   No pedal edema.      BACK:   Straight.  No deformities.   No CVA tenderness.      SKIN:   No rashes are evident.         Review of Systems:        CONSTITUTIONAL:   No fever or chills.  No night sweats.  Has had some recent weight loss.      EYES:   No recent visual changes.  No eye pain.      HEENT:  No sinus trouble or headache.  No masses or lumps.  No trouble swallowing.      CARDIOVASCULAR:   No chest pain.  No prior MI.   No cardiac surgery or interventions.   No dysrhythmias.     RESPIRATORY:   COPD.   No SOB, wheezing, cough, or hemoptysis.      GASTROINTESTINAL:   Hep C.  GERD.   Crohn's dz?   Has ongoing abdominal pain, bloating, and distention.  No GI bleeding.      GENITOURINARY:   Polycystic ovaries.  No dysuria.  No renal insufficiency.  No kidney stones.      MUSCULOSKELETAL:   No recent major joint replacements.  No new-onset bone or joint pain.  No recent trauma.      SKIN & BREAST:   No new skin lesions or rashes.  No pruritus.  No breast pain or lumps.       NEUROLOGIC:   H/o Sz.  No stroke, paralysis, numbness, or weakness.      PSYCHIATRIC:   No recent-onset anxiety, depression, or suicidal thoughts.      ENDOCRINE:   No diabetes.   No thyroid disease.   No adrenal disease.      HEMATOLOGIC & LYMPHATIC:   No easy bruising or bleeding.  No lymph-node enlargement.  No use of blood-thinners.      IMMUNOLOGIC:   No hives.  No h/o anaphylaxis.           Past Medical History:     Past Medical History:   Diagnosis Date    Anxiety     Bipolar 1 disorder     Chronic abdominal pain     Chronic obstructive pulmonary disease     Fibromyalgia     Gastroesophageal reflux disease     Insomnia     Seasonal allergic rhinitis        Past Surgical History:     Past Surgical History:   Procedure Laterality Date    APPENDECTOMY      CHOLECYSTECTOMY      COLON RESECTION      COLON SURGERY      HYSTERECTOMY      salpingo-oophorectomy R side     LAPAROTOMY, ABDOMINAL EXPLORATION      The patient has had multiple prior abdominal explorations, including colon resection, ileostomy's, and surgery for reported Crohn's disease.         Family History:     Family History   Problem Relation Age of Onset    Hypertension Mother     Diabetes Mother     Leukemia Father     Liver disease Maternal Grandfather     Hyperlipidemia Paternal Grandfather        Social History:     Social History     Tobacco Use   Smoking Status Current Every Day Smoker    Packs/day: 1.00    Types: Cigarettes   Smokeless Tobacco Never Used     Social History     Substance and Sexual Activity   Alcohol Use Never    Frequency: Never     Social History     Substance and Sexual Activity   Drug Use Not Currently    Comment: opiates -- last use 2013       Allergies:     Allergies   Allergen Reactions    Fentanyl Anaphylaxis    Reglan [Metoclopramide]     Rocephin [Ceftriaxone]     Tramadol  Vancomycin        Medications:     Current Outpatient Medications   Medication Sig Dispense Refill    albuterol  sulfate HFA (PROVENTIL) 108 (90 Base) MCG/ACT inhaler Inhale 2 puffs into the lungs every 4 (four) hours as needed for Wheezing or Shortness of Breath 1 Inhaler 2    clonazePAM (KlonoPIN) 0.5 MG tablet Take 1 tablet (0.5 mg total) by mouth 2 (two) times daily This is to be taken over by psychiatry on Nov 25, 2018 60 tablet 1    diphenoxylate-atropine (Lomotil) 2.5-0.025 MG per tablet Take 2 tablets by mouth 4 (four) times daily 240 tablet 2    fluticasone-salmeterol (ADVAIR HFA) 230-21 MCG/ACT inhaler Inhale 2 puffs into the lungs 2 (two) times daily 12 g 2    HYDROcodone-acetaminophen (NORCO) 5-325 MG per tablet Take 1 tablet by mouth every 6 (six) hours as needed for Pain 14 tablet 0    ibuprofen (ADVIL) 800 MG tablet Take 1 tablet (800 mg total) by mouth every 8 (eight) hours as needed for Pain 30 tablet 1    ondansetron (Zofran ODT) 4 MG disintegrating tablet Take 1 tablet (4 mg total) by mouth every 8 (eight) hours as needed for Nausea 8 tablet 0    pregabalin (LYRICA) 100 MG capsule Take 1 capsule (100 mg total) by mouth 2 (two) times daily 60 capsule 1    tiZANidine (ZANAFLEX) 4 MG tablet Take 2 tablets (8 mg total) by mouth every 8 (eight) hours as needed (fibromyalgia pain) 90 tablet 1    traZODone (DESYREL) 150 MG tablet Take 1.5 -2 tabs PO PRN at HS. 60 tablet 1     No current facility-administered medications for this visit.        Prior to Admission medications    Medication Sig Start Date End Date Taking? Authorizing Provider   albuterol sulfate HFA (PROVENTIL) 108 (90 Base) MCG/ACT inhaler Inhale 2 puffs into the lungs every 4 (four) hours as needed for Wheezing or Shortness of Breath 10/17/18 10/17/19  Marja Kays T, NP   clonazePAM (KlonoPIN) 0.5 MG tablet Take 1 tablet (0.5 mg total) by mouth 2 (two) times daily This is to be taken over by psychiatry on Nov 25, 2018 10/17/18 10/17/19  Marja Kays T, NP   diphenoxylate-atropine (Lomotil) 2.5-0.025 MG per tablet Take 2 tablets by mouth 4  (four) times daily 11/15/18 11/15/19  Marja Kays T, NP   fluticasone-salmeterol (ADVAIR HFA) 230-21 MCG/ACT inhaler Inhale 2 puffs into the lungs 2 (two) times daily 10/17/18   Marja Kays T, NP   HYDROcodone-acetaminophen (NORCO) 5-325 MG per tablet Take 1 tablet by mouth every 6 (six) hours as needed for Pain 12/09/18   Charolotte Eke, MD   ibuprofen (ADVIL) 800 MG tablet Take 1 tablet (800 mg total) by mouth every 8 (eight) hours as needed for Pain 12/03/18   Sharp, Skyler T, NP   ondansetron (Zofran ODT) 4 MG disintegrating tablet Take 1 tablet (4 mg total) by mouth every 8 (eight) hours as needed for Nausea 12/03/18   Justice Britain, MD   pregabalin (LYRICA) 100 MG capsule Take 1 capsule (100 mg total) by mouth 2 (two) times daily 11/15/18 11/15/19  Marja Kays T, NP   tiZANidine (ZANAFLEX) 4 MG tablet Take 2 tablets (8 mg total) by mouth every 8 (eight) hours as needed (fibromyalgia pain) 11/15/18   Marja Kays T, NP   traZODone (DESYREL) 150 MG tablet Take 1.5 -2 tabs PO  PRN at HS. 10/22/18   Hildred Priest, NP              Signed by:   Dineen Kid, MD          Although significant efforts were made to ensure accuracy of spelling and grammar, todays note was completed in large part by speech/voice recognition Chemical engineer) software and by keyboarding information into the electronic health care record in real time.  As such, there may be errors in grammar and spelling, insertion of incorrect words/phrases, pronoun errors, etc., that should be disregarded when reviewing this note.    JP

## 2018-12-12 ENCOUNTER — Encounter (RURAL_HEALTH_CENTER): Payer: Self-pay | Admitting: Specialist

## 2018-12-12 ENCOUNTER — Ambulatory Visit: Payer: Medicare Other | Attending: Specialist | Admitting: Specialist

## 2018-12-12 ENCOUNTER — Telehealth: Payer: Self-pay

## 2018-12-12 VITALS — BP 109/63 | HR 79 | Ht 65.0 in | Wt 140.2 lb

## 2018-12-12 DIAGNOSIS — R933 Abnormal findings on diagnostic imaging of other parts of digestive tract: Secondary | ICD-10-CM

## 2018-12-12 DIAGNOSIS — R1084 Generalized abdominal pain: Secondary | ICD-10-CM

## 2018-12-12 DIAGNOSIS — R110 Nausea: Secondary | ICD-10-CM

## 2018-12-12 DIAGNOSIS — Z9889 Other specified postprocedural states: Secondary | ICD-10-CM

## 2018-12-12 DIAGNOSIS — R140 Abdominal distension (gaseous): Secondary | ICD-10-CM

## 2018-12-12 DIAGNOSIS — K56609 Unspecified intestinal obstruction, unspecified as to partial versus complete obstruction: Secondary | ICD-10-CM

## 2018-12-12 DIAGNOSIS — Z8719 Personal history of other diseases of the digestive system: Secondary | ICD-10-CM

## 2018-12-12 DIAGNOSIS — R109 Unspecified abdominal pain: Secondary | ICD-10-CM

## 2018-12-12 NOTE — Patient Instructions (Signed)
Call the office or go to the nearest emergency department if symptoms are not better, or if the symptoms become worse.      - - - - - -      Selected phone numbers:    --  My office:       [phone]    --  [facility]:    [phone]  --  [facility]:      [phone]  --  [facility]:   [phone]     The information provided here is for general informational purposes only, and was not designed to diagnose or treat a health problem or disease, or replace the professional medical advice you receive from your doctor.  Please consult your health care provider with any questions or concerns you may have regarding your condition.

## 2018-12-12 NOTE — Addendum Note (Signed)
Addended by: Vergia Alberts III on: 12/12/2018 11:22 AM     Modules accepted: Orders

## 2018-12-13 ENCOUNTER — Ambulatory Visit: Payer: Medicare Other | Attending: Nurse Practitioner | Admitting: Nurse Practitioner

## 2018-12-13 ENCOUNTER — Other Ambulatory Visit (RURAL_HEALTH_CENTER): Payer: Self-pay | Admitting: Nurse Practitioner

## 2018-12-13 ENCOUNTER — Encounter (RURAL_HEALTH_CENTER): Payer: Self-pay | Admitting: Nurse Practitioner

## 2018-12-13 DIAGNOSIS — F5101 Primary insomnia: Secondary | ICD-10-CM

## 2018-12-13 DIAGNOSIS — M797 Fibromyalgia: Secondary | ICD-10-CM

## 2018-12-13 DIAGNOSIS — R110 Nausea: Secondary | ICD-10-CM

## 2018-12-13 DIAGNOSIS — M5441 Lumbago with sciatica, right side: Secondary | ICD-10-CM

## 2018-12-13 DIAGNOSIS — J410 Simple chronic bronchitis: Secondary | ICD-10-CM

## 2018-12-13 DIAGNOSIS — K912 Postsurgical malabsorption, not elsewhere classified: Secondary | ICD-10-CM

## 2018-12-13 DIAGNOSIS — R1084 Generalized abdominal pain: Secondary | ICD-10-CM

## 2018-12-13 DIAGNOSIS — F419 Anxiety disorder, unspecified: Secondary | ICD-10-CM

## 2018-12-13 MED ORDER — FLUTICASONE-SALMETEROL 230-21 MCG/ACT IN AERO
2.00 | INHALATION_SPRAY | Freq: Two times a day (BID) | RESPIRATORY_TRACT | 2 refills | Status: AC
Start: 2018-12-13 — End: ?

## 2018-12-13 MED ORDER — DIPHENOXYLATE-ATROPINE 2.5-0.025 MG PO TABS
2.0000 | ORAL_TABLET | Freq: Four times a day (QID) | ORAL | 2 refills | Status: DC
Start: 2018-12-13 — End: 2019-03-03

## 2018-12-13 MED ORDER — ALBUTEROL SULFATE HFA 108 (90 BASE) MCG/ACT IN AERS
2.0000 | INHALATION_SPRAY | RESPIRATORY_TRACT | 2 refills | Status: AC | PRN
Start: 2018-12-13 — End: 2019-12-13

## 2018-12-13 MED ORDER — TRAZODONE HCL 150 MG PO TABS
ORAL_TABLET | ORAL | 2 refills | Status: DC
Start: 2018-12-13 — End: 2019-03-11

## 2018-12-13 MED ORDER — ONDANSETRON 4 MG PO TBDP
4.00 mg | ORAL_TABLET | Freq: Three times a day (TID) | ORAL | 0 refills | Status: DC | PRN
Start: 2018-12-13 — End: 2019-01-13

## 2018-12-13 MED ORDER — KETOROLAC TROMETHAMINE 10 MG PO TABS
10.0000 mg | ORAL_TABLET | Freq: Four times a day (QID) | ORAL | 0 refills | Status: DC | PRN
Start: 2018-12-13 — End: 2019-01-13

## 2018-12-13 NOTE — Progress Notes (Signed)
Patient Name: [name],[name]    Subjective   History of Present Illness:   [name] is a 33 y.o. female who presents with:  Chief Complaint   Patient presents with    Medication Refill    Abdominal Pain     This visit was conducted with the use of interactive audio / video telecommunication that permitted real time communication between [employer], and myself. She consented to participation and received services through videoconferencing while I was located at [facility].    Abdominal Pain:  [name] complains of continued abdominal pain. She was admintted to [facility] on 12/05/2018 and was discharged on 12/09/2018. She sees a Development worker, international aid in [place] on 12/24/2018. She has not yet scheduled a follow up with GI at this time.   She reports that she is having regular BMs  She complains of continued pain today due to "scar tissue". Dr. Laurell Roof refused to prescribe her pain medication and referred her back to her PCP. Reviewed that we can do a short supply of non-opioid pain relief for her and she can follow-up with her surgeon for continued pain management.     Medication Refills:  She is also requests multiple medications. Discussed that her clonazepam needs to be refilled by her behavioral health specialist.       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 Authorizing Provider   albuterol sulfate HFA (PROVENTIL) 108 (90 Base) MCG/ACT inhaler Inhale 2 puffs into the  lungs every 4 (four) hours as needed for Wheezing or Shortness of Breath 10/17/18 10/17/19 Yes Marja Kays T, NP   clonazePAM (KlonoPIN) 0.5 MG tablet Take 1 tablet (0.5 mg total) by mouth 2 (two) times daily This is to be taken over by psychiatry on Nov 25, 2018 10/17/18 10/17/19 Yes Marja Kays T, NP   diphenoxylate-atropine (Lomotil) 2.5-0.025 MG per tablet Take 2 tablets by mouth 4 (four) times daily 11/15/18 11/15/19 Yes Marja Kays T, NP   fluticasone-salmeterol (ADVAIR HFA) 230-21 MCG/ACT inhaler Inhale 2 puffs into the lungs 2 (two) times daily 10/17/18  Yes Marja Kays T, NP   HYDROcodone-acetaminophen (NORCO) 5-325 MG per tablet Take 1 tablet by mouth every 6 (six) hours as needed for Pain 12/09/18  Yes Charolotte Eke, MD   ibuprofen (ADVIL) 800 MG tablet Take 1 tablet (800 mg total) by mouth every 8 (eight) hours as needed for Pain 12/03/18  Yes Harriett Azar T, NP   ondansetron (Zofran ODT) 4 MG disintegrating tablet Take 1 tablet (4 mg total) by mouth every 8 (eight) hours as needed for Nausea 12/03/18  Yes Justice Britain, MD   pregabalin (LYRICA) 100 MG capsule Take 1 capsule (100 mg total) by mouth 2 (two) times daily 11/15/18 11/15/19 Yes Arielys Wandersee T, NP   tiZANidine (ZANAFLEX) 4 MG tablet Take 2  tablets (8 mg total) by mouth every 8 (eight) hours as needed (fibromyalgia pain) 11/15/18  Yes Marja Kays T, NP   traZODone (DESYREL) 150 MG tablet Take 1.5 -2 tabs PO PRN at HS. 10/22/18  Yes Hildred Priest, NP        Review of Systems:   14 system review negative except as noted in the HPI.    Physical Exam:      No vitals due to telephonic communication.     General:  no acute distress. Pleasant and well groomed.  Neck: no JVD, no tracheal deviation  Lungs: Effort normal, no use accessory muscles.  Neuro:  alert and oriented.   Skin: no rashes or lesions noted  Psychiatric/Behavioral: appropriate affect, mood and behavior.       Assessment:     1. Nausea    2. Fibromyalgia    3.  Acute bilateral low back pain with right-sided sciatica    4. Simple chronic bronchitis    5. Short gut syndrome    6. Anxiety    7. Primary insomnia    8. Generalized abdominal pain        Plan:     Requested Prescriptions     Signed Prescriptions Disp Refills    traZODone (DESYREL) 150 MG tablet 60 tablet 2     Sig: Take 1-2 tabs PO PRN at HS.    ondansetron (Zofran ODT) 4 MG disintegrating tablet 30 tablet 0     Sig: Take 1 tablet (4 mg total) by mouth every 8 (eight) hours as needed for Nausea    fluticasone-salmeterol (ADVAIR HFA) 230-21 MCG/ACT inhaler 12 g 2     Sig: Inhale 2 puffs into the lungs 2 (two) times daily    diphenoxylate-atropine (Lomotil) 2.5-0.025 MG per tablet 240 tablet 2     Sig: Take 2 tablets by mouth 4 (four) times daily    albuterol sulfate HFA (PROVENTIL) 108 (90 Base) MCG/ACT inhaler 1 Inhaler 2     Sig: Inhale 2 puffs into the lungs every 4 (four) hours as needed for Wheezing or Shortness of Breath    ketorolac (TORADOL) 10 MG tablet 60 tablet 0     Sig: Take 1 tablet (10 mg total) by mouth every 6 (six) hours as needed for Pain       Patient Instructions   We make every attempt to respond to your calls and emails within 72 hours.  Please note that this is only during business hours.  If you have a question or a problem that needs attention sooner, and it is after hours please call the physician on-call at [phone], these services begin after 5PM on weekdays.  If you have an emergency, call 911.     Clonazepam must be filled by your psychiatrist. Please discuss this with them at your appointment on 01/07/2019.     Other medications will be prescribed as requested.     You have an appointment with Dr. Corrie Dandy on 12/24/2018 at 10:30.     Prescribed Toradol 10 mg every 6 hours, as needed. Use this medication sparingly-- I am prescribing only 60 tablets. If you experience any fever, abdominal pain, constipation, or worsening of symptoms, go to ED or call 911 immediately.         Return if symptoms worsen or fail to improve.    Signed by: Kathi Simpers, NP     Supervising Doctor: Rogelio Seen, MD    By signing my name below,  I, Dorise Hiss, attest that this documentation has been prepared under the direction and in the presence of Marja Kays, FNP-C.  Electronically Signed: Dorise Hiss Scribe.     I, Marja Kays, FNP-C, personally performed the services described in this documentation. All medical record entries made by the scribe were at my direction and in my presence. I have reviewed the chart and plan instructions and agree that the record reflects my personal performance and is accurate and complete.

## 2018-12-13 NOTE — Patient Instructions (Addendum)
We make every attempt to respond to your calls and emails within 72 hours.  Please note that this is only during business hours.  If you have a question or a problem that needs attention sooner, and it is after hours please call the physician on-call at [phone], these services begin after 5PM on weekdays.  If you have an emergency, call 911.     Clonazepam must be filled by your psychiatrist. Please discuss this with them at your appointment on 01/07/2019.     Other medications will be prescribed as requested.     You have an appointment with Dr. Corrie Dandy on 12/24/2018 at 10:30.     Prescribed Toradol 10 mg every 6 hours, as needed. Use this medication sparingly-- I am prescribing only 60 tablets. If you experience any fever, abdominal pain, constipation, or worsening of symptoms, go to ED or call 911 immediately.

## 2018-12-13 NOTE — Addendum Note (Signed)
Addended by: Vergia Alberts III on: 12/13/2018 03:15 PM     Modules accepted: Orders

## 2018-12-13 NOTE — Telephone Encounter (Signed)
Patient called to request tizanidine med refill- [facility]. Patient also said that the medication that you sent her for pain is not for pain, its an antinflammatory, and that it is not covered by her insurance. She doesn't know what the medication is called.

## 2018-12-16 ENCOUNTER — Telehealth (RURAL_HEALTH_CENTER): Payer: Self-pay

## 2018-12-16 NOTE — Telephone Encounter (Signed)
ketorolac 10mg  not covered, preferred products are ibuprofen, meloxicam, and naproxen.

## 2018-12-16 NOTE — Telephone Encounter (Signed)
She is already on Ibuprofen but if she would like an order for meloxicam I would be okay with feeling this in substitution of her Ibuprofen. I do not feel comfortable prescribing her anything stronger than an anti-inflammatory due to opioids can cause constipation making her condition worse. If she feels that she needs something stronger she will need to talk to Dr. Laurell Roof or call to get an appointment with the GI specialist that was recommended previously.     Kathi Simpers, FNP-C

## 2018-12-17 ENCOUNTER — Ambulatory Visit: Payer: Medicare Other | Attending: Nurse Practitioner | Admitting: Nurse Practitioner

## 2018-12-17 ENCOUNTER — Ambulatory Visit (RURAL_HEALTH_CENTER): Payer: Self-pay | Admitting: Specialist

## 2018-12-17 ENCOUNTER — Encounter (RURAL_HEALTH_CENTER): Payer: Self-pay | Admitting: Nurse Practitioner

## 2018-12-17 VITALS — Resp 16 | Ht 65.0 in | Wt 140.0 lb

## 2018-12-17 DIAGNOSIS — K567 Ileus, unspecified: Secondary | ICD-10-CM

## 2018-12-17 NOTE — Patient Instructions (Addendum)
We make every attempt to respond to your calls and emails within 72 hours.  Please note that this is only during business hours.  If you have a question or a problem that needs attention sooner, and it is after hours please call the physician on-call at [phone], these services begin after 5PM on weekdays.  If you have an emergency, call 911.     Referral placed to GI Surgeon in [place] per requested. Patient will be contacted by this specialists' office to schedule an appointment.

## 2018-12-17 NOTE — Telephone Encounter (Signed)
Pt seen for an appointment today. CNR

## 2018-12-17 NOTE — Progress Notes (Signed)
Patient Name: [name],[name]    Subjective   History of Present Illness:   [name] is a 33 y.o. female who presents with:  Chief Complaint   Patient presents with    ileus     wants a GI surgeon in [place] in [place]     This visit was conducted with the use of interactive audio / video telecommunication that permitted real time communication between [employer] and myself. She consented to participation and received services through videoconferencing while I was located at [facility].    Ileus:  Patient states she would like to see a GI surgeon in [place]. Per [name], her case has been rejected by a GI surgeon in [place] due to potential surgical complications however, there is no documentation reflecting this and still has an appointment on the schedule for December 1st. She denies any pain today and has no issues having a bowel movement at this time.        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 Authorizing Provider   albuterol sulfate HFA (PROVENTIL) 108 (90 Base) MCG/ACT inhaler Inhale 2 puffs into the lungs every 4 (four) hours as needed for Wheezing or Shortness of Breath 12/13/18 12/13/19 Yes Marja Kays T, NP   clonazePAM (KlonoPIN) 0.5 MG tablet Take 1 tablet (0.5 mg total) by mouth 2 (two) times daily This is to be taken over by psychiatry on Nov 25, 2018 10/17/18 10/17/19 Yes Marja Kays T, NP   diphenoxylate-atropine (Lomotil)  2.5-0.025 MG per tablet Take 2 tablets by mouth 4 (four) times daily 12/13/18 12/13/19 Yes Marja Kays T, NP   fluticasone-salmeterol (ADVAIR HFA) 230-21 MCG/ACT inhaler Inhale 2 puffs into the lungs 2 (two) times daily 12/13/18  Yes Marja Kays T, NP   HYDROcodone-acetaminophen (NORCO) 5-325 MG per tablet Take 1 tablet by mouth every 6 (six) hours as needed for Pain 12/09/18  Yes Charolotte Eke, MD   ibuprofen (ADVIL) 800 MG tablet Take 1 tablet (800 mg total) by mouth every 8 (eight) hours as needed for Pain 12/03/18  Yes Eldonna Neuenfeldt T, NP   ketorolac (TORADOL) 10 MG tablet Take 1 tablet (10 mg total) by mouth every 6 (six) hours as needed for Pain 12/13/18  Yes Kelyse Pask T, NP   ondansetron (Zofran ODT) 4 MG disintegrating tablet Take 1 tablet (4 mg total) by mouth every 8 (eight) hours as needed for Nausea 12/13/18  Yes Etheline Geppert T, NP   pregabalin (LYRICA) 100 MG capsule Take 1 capsule (100 mg total) by mouth 2 (two) times daily 11/15/18 11/15/19 Yes Chico Cawood T, NP   tiZANidine (ZANAFLEX) 4 MG tablet TAKE 2 TABLETS (8 MG TOTAL) BY MOUTH EVERY 8 (EIGHT) HOURS AS NEEDED (FIBROMYALGIA PAIN) 12/16/18  Yes Marja Kays T, NP   traZODone (DESYREL) 150 MG tablet  Take 1-2 tabs PO PRN at HS. 12/13/18  Yes Dustin Bumbaugh T, NP        Review of Systems:   14 system review negative except as noted in the HPI.    Physical Exam:      Vitals:    12/17/18 0856   Resp: 16   Weight: 63.5 kg (140 lb)   Height: 1.651 m (5\' 5" )       General:  no acute distress. Pleasant and well groomed.  Neck: no JVD, no tracheal deviation  Lungs: Effort normal, no use accessory muscles.  Neuro:  alert and oriented.   Skin: no rashes or lesions noted  Psychiatric/Behavioral: appropriate affect, mood and behavior.       Assessment:     1. Ileus        Plan:     Orders Placed This Encounter   Procedures    Ambulatory referral to Gastroenterology     Referral Priority:   Routine     Referral Type:   Consultation      Referral Reason:   Specialty Services Required     Requested Specialty:   Gastroenterology     Number of Visits Requested:   1       Requested Prescriptions      No prescriptions requested or ordered in this encounter       Patient Instructions   We make every attempt to respond to your calls and emails within 72 hours.  Please note that this is only during business hours.  If you have a question or a problem that needs attention sooner, and it is after hours please call the physician on-call at [phone], these services begin after 5PM on weekdays.  If you have an emergency, call 911.     Referral placed to GI Surgeon in [place] per requested. Patient will be contacted by this specialists' office to schedule an appointment.            No follow-ups on file.    Signed by: Kathi Simpers, NP     Supervising Doctor: Rogelio Seen, MD    By signing my name below, I, Phill Myron, attest that this documentation has been prepared under the direction and in the presence of Angelin Cutrone, FNP-C.  Electronically Signed: Phill Myron, Scribe.     I, Marja Kays, FNP-C, personally performed the services described in this documentation. All medical record entries made by the scribe were at my direction and in my presence. I have reviewed the chart and plan instructions and agree that the record reflects my personal performance and is accurate and complete.

## 2018-12-24 ENCOUNTER — Ambulatory Visit: Payer: Medicare Other | Attending: Nurse Practitioner | Admitting: Nurse Practitioner

## 2018-12-24 ENCOUNTER — Ambulatory Visit (INDEPENDENT_AMBULATORY_CARE_PROVIDER_SITE_OTHER): Payer: Medicare Other | Admitting: Surgery

## 2018-12-24 ENCOUNTER — Ambulatory Visit (RURAL_HEALTH_CENTER): Payer: Medicare Other

## 2018-12-24 ENCOUNTER — Other Ambulatory Visit (RURAL_HEALTH_CENTER): Payer: Self-pay | Admitting: Nurse Practitioner

## 2018-12-24 ENCOUNTER — Encounter (RURAL_HEALTH_CENTER): Payer: Self-pay | Admitting: Nurse Practitioner

## 2018-12-24 DIAGNOSIS — Z20828 Contact with and (suspected) exposure to other viral communicable diseases: Secondary | ICD-10-CM

## 2018-12-24 DIAGNOSIS — J069 Acute upper respiratory infection, unspecified: Secondary | ICD-10-CM

## 2018-12-24 DIAGNOSIS — R059 Cough, unspecified: Secondary | ICD-10-CM

## 2018-12-24 DIAGNOSIS — Z0184 Encounter for antibody response examination: Secondary | ICD-10-CM | POA: Insufficient documentation

## 2018-12-24 DIAGNOSIS — R05 Cough: Secondary | ICD-10-CM | POA: Insufficient documentation

## 2018-12-24 DIAGNOSIS — Z1159 Encounter for screening for other viral diseases: Secondary | ICD-10-CM | POA: Insufficient documentation

## 2018-12-24 MED ORDER — DOXYCYCLINE HYCLATE 100 MG PO CAPS
100.00 mg | ORAL_CAPSULE | Freq: Two times a day (BID) | ORAL | 0 refills | Status: AC
Start: 2018-12-24 — End: 2018-12-31

## 2018-12-24 MED ORDER — GUAIFENESIN-CODEINE 100-10 MG/5ML PO SYRP
5.00 mL | ORAL_SOLUTION | Freq: Every evening | ORAL | 0 refills | Status: DC | PRN
Start: 2018-12-24 — End: 2019-01-13

## 2018-12-24 NOTE — Progress Notes (Signed)
Patient Name: [name]    Subjective   History of Present Illness:   [name] is a 33 y.o. female who presents with:  Chief Complaint   Patient presents with    Cough    Chest Pain    Generalized Body Aches     This visit was conducted with the use of interactive audio / video telecommunication that permitted real time communication between [employer], and myself. She consented to participation and received services through videoconferencing while I was located at [facility].    [name] is a 33 y.o female who presents to the clinic today via telehealth for having a cough, chest pain, and generalized body aches.      Cough:  She states that she feels "like crap" today. She she said that she has been having chills since before Thanksgiving. She has a cough but with "nothing come up." She states that she has pain in her left ear. She denies having a sore throat, she says that it's been a "little scratchy." She says that she has a "stuffed" nose. She says that her symptoms are staying about the same. She feels that she has a little short of breath.          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 Authorizing Provider   albuterol sulfate HFA (PROVENTIL) 108 (90 Base) MCG/ACT inhaler Inhale 2 puffs into the lungs every 4 (four) hours as needed for Wheezing or Shortness of Breath 12/13/18 12/13/19 Yes Marja Kays T, NP   clonazePAM (KlonoPIN) 0.5 MG tablet  Take 1 tablet (0.5 mg total) by mouth 2 (two) times daily This is to be taken over by psychiatry on Nov 25, 2018 10/17/18 10/17/19 Yes Marja Kays T, NP   diphenoxylate-atropine (Lomotil) 2.5-0.025 MG per tablet Take 2 tablets by mouth 4 (four) times daily 12/13/18 12/13/19 Yes Marja Kays T, NP   fluticasone-salmeterol (ADVAIR HFA) 230-21 MCG/ACT inhaler Inhale 2 puffs into the lungs 2 (two) times daily 12/13/18  Yes Marja Kays T, NP   HYDROcodone-acetaminophen (NORCO) 5-325 MG per tablet Take 1 tablet by mouth every 6 (six) hours as needed for Pain 12/09/18  Yes Charolotte Eke, MD   ibuprofen (ADVIL) 800 MG tablet Take 1 tablet (800 mg total) by mouth every 8 (eight) hours as needed for Pain 12/03/18  Yes Hartley Urton T, NP   ketorolac (TORADOL) 10 MG tablet Take 1 tablet (10 mg total) by mouth every 6 (six) hours as needed for Pain 12/13/18  Yes Morenike Cuff T, NP   ondansetron (Zofran ODT) 4 MG disintegrating tablet Take 1 tablet (4 mg total) by mouth every 8 (eight) hours as needed for Nausea 12/13/18  Yes Twan Harkin T, NP   pregabalin (LYRICA) 100 MG capsule Take 1 capsule (100 mg total) by mouth  2 (two) times daily 11/15/18 11/15/19 Yes Kinser Fellman T, NP   tiZANidine (ZANAFLEX) 4 MG tablet TAKE 2 TABLETS (8 MG TOTAL) BY MOUTH EVERY 8 (EIGHT) HOURS AS NEEDED (FIBROMYALGIA PAIN) 12/16/18  Yes Marja Kays T, NP   traZODone (DESYREL) 150 MG tablet Take 1-2 tabs PO PRN at HS. 12/13/18  Yes Ashawna Hanback T, NP        Review of Systems:     14 system review negative except as noted in the HPI.    Physical Exam:      No vitals due to telephonic communication.     General:  no acute distress. Pleasant and well groomed.  Neck: no JVD, no tracheal deviation  Lungs: Effort normal, no use accessory muscles.  Neuro:  alert and oriented.   Skin: no rashes or lesions noted  Psychiatric/Behavioral: appropriate affect, mood and behavior.       Assessment:     1. Upper respiratory tract infection, unspecified  type    2. Cough    3. Screening for viral disease        Plan:   Patient Instructions   We make every attempt to respond to your calls and emails within 72 hours.  Please note that this is only during business hours.  If you have a question or a problem that needs attention sooner, and it is after hours please call the physician on-call at [phone], these services begin after 5PM on weekdays.  If you have an emergency, call 911.      COVID-19 Counseling   [x]  Discuss the need for immediate isolation, even before results of the test are available.   [x]  Advise patients to inform their immediate household/contacts that they may wish to be tested and quarantine as well. Review locations and people they have been in contact with in the past two weeks.   [x]  Review the signs and symptoms of COVID-19.   [x]  Inform patients that if positive, they will likely be contacted by a public health worker and asked to provide a list of the people they've been with for contact tracing, encourage them to 'answer the call'.   [x]  Discuss services that might help the patient successfully isolate and quarantine at home  [x]  50% of time spent counseling and coordination for care for COVID exposure    Continue to rest as much as possible until you feel stronger. Dont let yourself get overly tired when you go back to your activities.  Avoid cigarette smoke or other irritants to the lungs. Consider smoking cessation if you smoke as it lowers your ability to fight infection.  Continue to use Tylenol or Ibuprofen for pain/fever.  Drink plenty of fluids (water).  Elevate head of bed.  Avoid acidic food and fluids, like orange juice or tomatoes.  Avoid milk while you have congestion.  Salt water gargle (1/2 teaspoon salt in 1 cup of warm water)or honey to soothe throat.  May use a humidifier.   Guaiphenesin-Codeine 5 mls at bedtime as needed for cough. This medication will make you sleepy. Do not operate a vehicle or make decisions while  on this medication. Keep locked up and out of reach of children.  Wash your hands frequently! This helps prevent the spread of germs.  Follow-up as needed.             Requested Prescriptions     Signed Prescriptions Disp Refills    guaiFENesin-codeine (ROBITUSSIN AC) 100-10 MG/5ML syrup 35 mL 0  Sig: Take 5 mLs by mouth nightly as needed for Cough        Patient was counseled on possible medicine side effects which may include rash, swelling and/or stomach upset.  Patient was instructed to notify me or the ER if they experience problems.    Orders Placed This Encounter   Procedures    SARS-CoV-2 Assay ([employer] System(TM))     Standing Status:   Future     Standing Expiration Date:   12/24/2019     Order Specific Question:   Specimen     Answer:   Nasopharyngeal Swab     Order Specific Question:   Does patient have symptoms related to condition of interest?     Answer:   Y     Order Specific Question:   Symptom Date of Onset if Known     Answer:   Date of Onset Known     Order Specific Question:   Date of Onset     Answer:   12/19/2018     Order Specific Question:   Does patient reside in congregate care setting?     Answer:   N     Order Specific Question:   Is patient employed in a healthcare setting?     Answer:   N     Order Specific Question:   Is the patient pregnant?     Answer:   N    [facility] SARS Coronavirus Ant[facility] POCT     Order Specific Question:   Specimen     Answer:   Nasal Swab COVID-19     Order Specific Question:   First Test?     Answer:   Yes     Order Specific Question:   Does patient have symptoms related to condition of interest?     Answer:   Y     Order Specific Question:   Symptom Date of Onset if Known     Answer:   Date of Onset Known     Order Specific Question:   Date of Onset     Answer:   12/19/2018     Order Specific Question:   Does patient reside in congregate care setting?     Answer:   N     Order Specific Question:   Is patient employed in a healthcare setting?      Answer:   N     Order Specific Question:   Is the patient hospitalized because of this condition?     Answer:   N     Order Specific Question:   Is patient admitted to the intensive care unit?     Answer:   N     Order Specific Question:   Is the patient pregnant?     Answer:   N    POCT Influenza A/B        Before leaving the office, the patient was informed of all ordered diagnostic tests and consults.     If you have not heard back from [place] about test results in 14 days, please call the office at [phone].    Signed by: Marja Kays, FNP-C    Supervising Doctor: Rogelio Seen, MD    The patient's electronic medical record was reviewed, any changes in the past medical history, past surgical history, medications, diagnostic tests were noted, and the record was updated accordingly.     Discussed option available for my chart which provides electronic access to  diagnostic results.      By signing my name below, I, Elesa Massed, attest that this documentation has been prepared under the direction and in the presence of Kymere Fullington, FNP-C.  Electronically Signed: Elesa Massed, Scribe.     I, Marja Kays, FNP-C, personally performed the services described in this documentation. All medical record entries made by the scribe were at my direction and in my presence. I have reviewed the chart and plan instructions and agree that the record reflects my personal performance and is accurate and complete.

## 2018-12-24 NOTE — Patient Instructions (Addendum)
We make every attempt to respond to your calls and emails within 72 hours.  Please note that this is only during business hours.  If you have a question or a problem that needs attention sooner, and it is after hours please call the physician on-call at [phone], these services begin after 5PM on weekdays.  If you have an emergency, call 911.      COVID-19 Counseling   [x]  Discuss the need for immediate isolation, even before results of the test are available.   [x]  Advise patients to inform their immediate household/contacts that they may wish to be tested and quarantine as well. Review locations and people they have been in contact with in the past two weeks.   [x]  Review the signs and symptoms of COVID-19.   [x]  Inform patients that if positive, they will likely be contacted by a public health worker and asked to provide a list of the people they've been with for contact tracing, encourage them to 'answer the call'.   [x]  Discuss services that might help the patient successfully isolate and quarantine at home  [x]  50% of time spent counseling and coordination for care for COVID exposure    Continue to rest as much as possible until you feel stronger. Dont let yourself get overly tired when you go back to your activities.  Avoid cigarette smoke or other irritants to the lungs. Consider smoking cessation if you smoke as it lowers your ability to fight infection.  Continue to use Tylenol or Ibuprofen for pain/fever.  Drink plenty of fluids (water).  Elevate head of bed.  Avoid acidic food and fluids, like orange juice or tomatoes.  Avoid milk while you have congestion.  Salt water gargle (1/2 teaspoon salt in 1 cup of warm water)or honey to soothe throat.  May use a humidifier.   Guaiphenesin-Codeine 5 mls at bedtime as needed for cough. This medication will make you sleepy. Do not operate a vehicle or make decisions while on this medication. Keep locked up and out of reach of children.  Wash your hands  frequently! This helps prevent the spread of germs.  Follow-up as needed.

## 2018-12-25 ENCOUNTER — Other Ambulatory Visit
Admission: RE | Admit: 2018-12-25 | Discharge: 2018-12-25 | Disposition: A | Discharge status: Home / Self Care | Payer: Medicare Other | Source: Ambulatory Visit | Attending: Nurse Practitioner | Admitting: Nurse Practitioner

## 2018-12-25 DIAGNOSIS — Z1159 Encounter for screening for other viral diseases: Secondary | ICD-10-CM

## 2018-12-25 DIAGNOSIS — F331 Major depressive disorder, recurrent, moderate: Secondary | ICD-10-CM | POA: Insufficient documentation

## 2018-12-25 LAB — VH SARS-COV-2 ASSAY (PERKINELMER SYSTEM(TM))
Date of Onset: 20201126
Does patient reside in a congregate care setting?: NEGATIVE
Is patient employed in a healthcare setting?: NEGATIVE
Is the patient pregnant?: NEGATIVE
SARS-CoV-2 Assay (PerkinElmer System (TM)): NOT DETECTED

## 2018-12-25 LAB — POCT INFLUENZA A/B
POCT Rapid Influenza A AG: NEGATIVE
POCT Rapid Influenza B AG: NEGATIVE

## 2018-12-25 LAB — VH AMB POCT SOFIA (TM)SARS CORONAVIRUS ANTIGEN FIA: Sofia SARS-CoV-2 Ag POCT: NEGATIVE

## 2018-12-31 ENCOUNTER — Other Ambulatory Visit (RURAL_HEALTH_CENTER): Payer: Self-pay | Admitting: Nurse Practitioner

## 2018-12-31 ENCOUNTER — Telehealth (RURAL_HEALTH_CENTER): Payer: Self-pay

## 2018-12-31 DIAGNOSIS — M797 Fibromyalgia: Secondary | ICD-10-CM

## 2018-12-31 NOTE — Telephone Encounter (Signed)
Pt called and would like to be referred to Pain and Spine in [place]. CNR

## 2018-12-31 NOTE — Telephone Encounter (Signed)
Order placed.     Aydien Majette T Adler Alton, FNP-C

## 2019-01-02 ENCOUNTER — Encounter (RURAL_HEALTH_CENTER): Payer: Self-pay | Admitting: Nurse Practitioner

## 2019-01-02 ENCOUNTER — Ambulatory Visit: Payer: Medicare Other | Attending: Nurse Practitioner | Admitting: Nurse Practitioner

## 2019-01-02 VITALS — Resp 18 | Ht 65.0 in | Wt 140.0 lb

## 2019-01-02 DIAGNOSIS — K567 Ileus, unspecified: Secondary | ICD-10-CM

## 2019-01-02 DIAGNOSIS — M5441 Lumbago with sciatica, right side: Secondary | ICD-10-CM

## 2019-01-02 MED ORDER — CARISOPRODOL 350 MG PO TABS
350.0000 mg | ORAL_TABLET | Freq: Two times a day (BID) | ORAL | 0 refills | Status: DC | PRN
Start: 2019-01-02 — End: 2019-01-13

## 2019-01-02 NOTE — Progress Notes (Signed)
Patient Name: [name],[name]    Subjective   History of Present Illness:   Jeremiah Shelly Bearfield is a 33 y.o. female who presents with:  Chief Complaint   Patient presents with    Medication Management     Referral to pain management     This visit was conducted with the use of interactive audio / video telecommunication that permitted real time communication between [employer] and myself. She consented to participation and received services through videoconferencing while I was located at [facility].    Medication Management:  Ms. Reichow presents today to discuss medication management. Patient states she would like to change her muscle relaxer medication (Zanaflex 4 MG) as she states "it is no longer working."     Illeus:  Patient states she still does not have an appointment with a GI surgeon per her report. She would like to go to [facility] and requests another referral. She denies any current concerns but would like a follow-up. She reports she has had multiple abdominal surgeries in the past and will get her records from [place] to take to her provider appointment.        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 Authorizing Provider   albuterol sulfate HFA (PROVENTIL) 108 (90 Base) MCG/ACT inhaler Inhale 2 puffs into the lungs every 4 (four) hours as needed for Wheezing or Shortness of Breath 12/13/18 12/13/19 Yes Marja Kays T, NP   clonazePAM (KlonoPIN) 0.5 MG tablet Take 1  tablet (0.5 mg total) by mouth 2 (two) times daily This is to be taken over by psychiatry on Nov 25, 2018 10/17/18 10/17/19 Yes Marja Kays T, NP   diphenoxylate-atropine (Lomotil) 2.5-0.025 MG per tablet Take 2 tablets by mouth 4 (four) times daily 12/13/18 12/13/19 Yes Marja Kays T, NP   fluticasone-salmeterol (ADVAIR HFA) 230-21 MCG/ACT inhaler Inhale 2 puffs into the lungs 2 (two) times daily 12/13/18  Yes Felton Buczynski T, NP   guaiFENesin-codeine (ROBITUSSIN AC) 100-10 MG/5ML syrup Take 5 mLs by mouth nightly as needed for Cough 12/24/18  Yes Zahara Rembert T, NP   HYDROcodone-acetaminophen (NORCO) 5-325 MG per tablet Take 1 tablet by mouth every 6 (six) hours as needed for Pain 12/09/18  Yes Charolotte Eke, MD   ibuprofen (ADVIL) 800 MG tablet Take 1 tablet (800 mg total) by mouth every 8 (eight) hours as needed for Pain 12/03/18  Yes Jaceon Heiberger T, NP   ketorolac (TORADOL) 10 MG tablet Take 1 tablet (10 mg total) by mouth every 6 (six) hours as needed for Pain 12/13/18  Yes Flavio Lindroth T, NP   ondansetron (Zofran ODT) 4 MG disintegrating tablet Take 1 tablet (4 mg total) by mouth every 8 (eight) hours as needed for Nausea 12/13/18  Yes Joelly Bolanos T, NP   pregabalin (LYRICA) 100 MG capsule  Take 1 capsule (100 mg total) by mouth 2 (two) times daily 11/15/18 11/15/19 Yes Carver Murakami T, NP   tiZANidine (ZANAFLEX) 4 MG tablet TAKE 2 TABLETS (8 MG TOTAL) BY MOUTH EVERY 8 (EIGHT) HOURS AS NEEDED (FIBROMYALGIA PAIN) 12/16/18  Yes Marja Kays T, NP   traZODone (DESYREL) 150 MG tablet Take 1-2 tabs PO PRN at HS. 12/13/18  Yes Coreon Simkins T, NP        Review of Systems:   14 system review negative except as noted in the HPI.    Physical Exam:      Vitals:    01/02/19 1054   Resp: 18   Weight: 63.5 kg (140 lb)   Height: 1.651 m (5\' 5" )       General:  no acute distress. Pleasant and well groomed.  Neck: no JVD, no tracheal deviation  Lungs: Effort normal, no use accessory muscles.  Neuro:  alert and  oriented.   Skin: no rashes or lesions noted  Psychiatric/Behavioral: appropriate affect, mood and behavior.       Assessment:     1. Ileus    2. Acute bilateral low back pain with right-sided sciatica        Plan:     Orders Placed This Encounter   Procedures    Ambulatory referral to Gastroenterology     Referral Priority:   Routine     Referral Type:   Consultation     Referral Reason:   Specialty Services Required     Requested Specialty:   Gastroenterology     Number of Visits Requested:   1       Requested Prescriptions     Signed Prescriptions Disp Refills    carisoprodol (Soma) 350 MG tablet 30 tablet 0     Sig: Take 1 tablet (350 mg total) by mouth 2 (two) times daily as needed for Muscle spasms       Patient Instructions   We make every attempt to respond to your calls and emails within 72 hours.  Please note that this is only during business hours.  If you have a question or a problem that needs attention sooner, and it is after hours please call the physician on-call at [phone], these services begin after 5PM on weekdays.  If you have an emergency, call 911.     Referral to pain management.     Referral placed to GI at the [facility] [place]. Patient will be contacted by this specialists' office to schedule an appointment.     Prescribed Soma 350 MG, take 1 tablet two time daily as needed. This medication is only used for a make of 3 weeks. I have given you a month supply. Your pain management specialist may want to change this once you get in with them.            Return if symptoms worsen or fail to improve.    Signed by: Kathi Simpers, NP     Supervising Doctor: Rogelio Seen, MD    By signing my name below, I, Phill Myron, attest that this documentation has been prepared under the direction and in the presence of Luiscarlos Kaczmarczyk, FNP-C.  Electronically Signed: Phill Myron, Scribe.     I, Marja Kays, FNP-C, personally performed the services described in this documentation.  All medical record entries made by the scribe were at my direction and in my presence. I have reviewed the chart and plan instructions and agree that the  record reflects my personal performance and is accurate and complete.

## 2019-01-02 NOTE — Patient Instructions (Addendum)
We make every attempt to respond to your calls and emails within 72 hours.  Please note that this is only during business hours.  If you have a question or a problem that needs attention sooner, and it is after hours please call the physician on-call at [phone], these services begin after 5PM on weekdays.  If you have an emergency, call 911.     Referral to pain management.     Referral placed to GI at the [facility] [place]. Patient will be contacted by this specialists' office to schedule an appointment.     Prescribed Soma 350 MG, take 1 tablet two time daily as needed. This medication is only used for a make of 3 weeks. I have given you a month supply. Your pain management specialist may want to change this once you get in with them.

## 2019-01-13 ENCOUNTER — Telehealth (RURAL_HEALTH_CENTER): Payer: Self-pay | Admitting: Family

## 2019-01-13 ENCOUNTER — Ambulatory Visit: Payer: Medicare Other | Attending: Family | Admitting: Family

## 2019-01-13 ENCOUNTER — Encounter (RURAL_HEALTH_CENTER): Payer: Self-pay | Admitting: Family

## 2019-01-13 DIAGNOSIS — R110 Nausea: Secondary | ICD-10-CM

## 2019-01-13 DIAGNOSIS — R059 Cough, unspecified: Secondary | ICD-10-CM

## 2019-01-13 DIAGNOSIS — R197 Diarrhea, unspecified: Secondary | ICD-10-CM

## 2019-01-13 DIAGNOSIS — M5441 Lumbago with sciatica, right side: Secondary | ICD-10-CM

## 2019-01-13 DIAGNOSIS — R05 Cough: Secondary | ICD-10-CM

## 2019-01-13 MED ORDER — ONDANSETRON 4 MG PO TBDP
4.00 mg | ORAL_TABLET | Freq: Three times a day (TID) | ORAL | 0 refills | Status: DC | PRN
Start: 2019-01-13 — End: 2019-02-19

## 2019-01-13 MED ORDER — CARISOPRODOL 350 MG PO TABS
350.0000 mg | ORAL_TABLET | Freq: Two times a day (BID) | ORAL | 0 refills | Status: DC | PRN
Start: 2019-01-13 — End: 2019-02-05

## 2019-01-13 MED ORDER — GUAIFENESIN-CODEINE 100-10 MG/5ML PO SYRP
5.00 mL | ORAL_SOLUTION | Freq: Every evening | ORAL | 0 refills | Status: DC | PRN
Start: 2019-01-13 — End: 2019-02-05

## 2019-01-13 NOTE — Telephone Encounter (Signed)
I cannot send in the patient's potassium that she requested until she gets the lab work done, as this would be dangerous and could cause cardiac issues.  She has not had lab work in over a month.  I have placed the order for this.  She did discuss with me she has difficulty getting a ride, please review of her she has muscle cramping or any worsening diarrhea or vomiting that she should call the rescue squad to be evaluated at the hospital.  I have sent in the other refill she requested.  Thank you,   Volney Presser, FNP-C

## 2019-01-13 NOTE — Progress Notes (Signed)
PROGRESS NOTE      Patient Name: [name],[name]  Primary Care Physician: Kathi Simpers, NP      History of Presenting Illness:   [name] is a 33 y.o. female who presents to the office with   Chief Complaint   Patient presents with   . Nausea     x5 days   . Emesis     x5 days   . Diarrhea     x5 days   . Cough     x5 days   . Dizziness     x5 days     Patient is complaining of nausea, emesis, diarrhea, cough and dizziness for the past 5 days.  Patient has a history of short gut syndrome.  Patient states she started abilify 1 month and states that she was having dizziness and lightheadedness but this has improved since she went off the Abilify.  Patient follows [facility] for this.  Patient states she has had a couple episodes of vomiting where her food is not digested yet.  She is supposed to be following up with gastroenterology but has canceled several appointments in regards to this.  Patient is also going to be followed by pain management.  Patient states she does have a cough, is worse at night.  Is nonproductive, she denies fever.  She denies shortness of breath or chest pain.  The patient is requesting a refill of her Tresa Garter that was recently started for her muscle spasms, she is also requesting a refill for her guaifenesin.    This visit was conducted with the use of a HIPPA compliant interactive audiovisual telecommunication that permitted real time communication between [employer] and myself, Darlen Round, FNP-C. Consent to participation and receive services was obtained,  the patient was located at home, while I was located at [facility] clinic.  This visit was changed from an in-person visit to a telehealth visit to lower the risk of exposure and / or spread of the current pandemic with the SARS CoV-2 virus. This is based on the guidelines from the [facility] and other health agencies.    Past Medical History:     Past Medical History:    Diagnosis Date   . Anxiety    . Bipolar 1 disorder    . Chronic abdominal pain    . Chronic obstructive pulmonary disease    . Fibromyalgia    . Gastroesophageal reflux disease    . Insomnia    . Seasonal allergic rhinitis        Past Surgical History:     Past Surgical History:   Procedure Laterality Date   . APPENDECTOMY     . CHOLECYSTECTOMY     . COLON RESECTION     . COLON SURGERY     . HYSTERECTOMY      salpingo-oophorectomy R side    . LAPAROTOMY, ABDOMINAL EXPLORATION      The patient has had multiple prior abdominal explorations, including colon resection, ileostomy's, and surgery for reported Crohn's disease.         Family History:     Family History   Problem Relation Age of Onset   . Hypertension Mother    . Diabetes Mother    . Leukemia Father    . Liver disease Maternal Grandfather    . Hyperlipidemia Paternal Grandfather        Social History:     Social History  Tobacco Use   Smoking Status Current Every Day Smoker   . Packs/day: 1.00   . Types: Cigarettes   Smokeless Tobacco Never Used     Social History     Substance and Sexual Activity   Alcohol Use Never   . Frequency: Never     Social History     Substance and Sexual Activity   Drug Use Not Currently    Comment: opiates -- last use 2013       Allergies:     Allergies   Allergen Reactions   . Fentanyl Anaphylaxis   . Reglan [Metoclopramide]    . Rocephin [Ceftriaxone]    . Tramadol    . Vancomycin        Medications:     Prior to Admission medications    Medication Sig Start Date End Date Taking? Authorizing Provider   albuterol sulfate HFA (PROVENTIL) 108 (90 Base) MCG/ACT inhaler Inhale 2 puffs into the lungs every 4 (four) hours as needed for Wheezing or Shortness of Breath 12/13/18 12/13/19 Yes Marja Kays T, NP   carisoprodol (Soma) 350 MG tablet Take 1 tablet (350 mg total) by mouth 2 (two) times daily as needed for Muscle spasms 01/02/19  Yes Marja Kays T, NP   clonazePAM (KlonoPIN) 0.5 MG tablet Take 1 tablet (0.5 mg total) by  mouth 2 (two) times daily This is to be taken over by psychiatry on Nov 25, 2018 10/17/18 10/17/19 Yes Marja Kays T, NP   diphenoxylate-atropine (Lomotil) 2.5-0.025 MG per tablet Take 2 tablets by mouth 4 (four) times daily 12/13/18 12/13/19 Yes Marja Kays T, NP   fluticasone-salmeterol (ADVAIR HFA) 230-21 MCG/ACT inhaler Inhale 2 puffs into the lungs 2 (two) times daily 12/13/18  Yes Sharp, Skyler T, NP   guaiFENesin-codeine (ROBITUSSIN AC) 100-10 MG/5ML syrup Take 5 mLs by mouth nightly as needed for Cough 12/24/18  Yes Sharp, Skyler T, NP   ibuprofen (ADVIL) 800 MG tablet Take 1 tablet (800 mg total) by mouth every 8 (eight) hours as needed for Pain 12/03/18  Yes Sharp, Skyler T, NP   ondansetron (Zofran ODT) 4 MG disintegrating tablet Take 1 tablet (4 mg total) by mouth every 8 (eight) hours as needed for Nausea 12/13/18  Yes Sharp, Skyler T, NP   pregabalin (LYRICA) 100 MG capsule Take 1 capsule (100 mg total) by mouth 2 (two) times daily 11/15/18 11/15/19 Yes Sharp, Skyler T, NP   traZODone (DESYREL) 150 MG tablet Take 1-2 tabs PO PRN at HS. 12/13/18  Yes Sharp, Skyler T, NP   ARIPiprazole (ABILIFY) 5 MG tablet Take 10 mg by mouth 01/02/19   [provider]   HYDROcodone-acetaminophen (NORCO) 5-325 MG per tablet Take 1 tablet by mouth every 6 (six) hours as needed for Pain 12/09/18 01/13/19  Charolotte Eke, MD   ketorolac (TORADOL) 10 MG tablet Take 1 tablet (10 mg total) by mouth every 6 (six) hours as needed for Pain 12/13/18 01/13/19  Marja Kays T, NP       Review of Systems:      Constitutional: Negative for fever, fatigue or weight changes.   HENT: Negative for ear pain, congestion, rhinorrhea and neck pain.    Eyes: Negative for discharge, redness and itching.   Respiratory: Negative for shortness of breath.  Positive for cough.   Cardiovascular: Negative for chest pain, palpitations and leg swelling.   Gastrointestinal: Positive for nausea, vomiting.  Negative for abdominal pain,  heartburn.   Genitourinary: Negative for  dysuria, urgency and difficulty urinating.   Endocrine: Negative for polyuria, polydipsia, polyphagia, or heat/cold intolerances.  Musculoskeletal: Negative for joint swelling and gait problem.   Neurological: Negative for dizziness, weakness and headaches.   Skin: Negative for rashes or lesions.  Psychiatric/Behavioral: Negative.      Physical Exam:   There were no vitals filed for this visit.  There is no height or weight on file to calculate BMI.    General: Nontoxic-appearing, no acute distress. Pleasant and well groomed.  Neck: no JVD, no tracheal deviation  Lungs: Effort normal, no use accessory muscles.  Neuro:  alert and oriented.   Skin: no rashes or lesions noted  Psychiatric/Behavioral: appropriate affect, mood and behavior.      No results found for this or any previous visit (from the past 336 hour(s)).   Physical Exam     Assessment:     1. Diarrhea, unspecified type    2. Nausea    3. Cough    4. Acute bilateral low back pain with right-sided sciatica          Medical Decision Making:  The patient declines needing to be Covid tested as she feels she has not been around anybody this been ill and has mainly stayed at home.    I reviewed with the patient we need to check her lab work, she states she has a history of hypokalemia.  Reviewed with her the importance of Gatorade and propel for electrolyte hydration.    I have refilled her Soma for a short supply.  I cannot refill it any longer as this will be her third week.  I have also refilled the patient's Robitussin.  Discussed with her not to take those 2 together.    Patient is seen pain management on January 15.    I reviewed with the patient the importance of close follow-up to the emergency department as she has a significant history for bowel obstructions.  She is understanding aware denies questions.    I have provided the patient with the plan of care.  The patient denies any further questions, will follow  up as needed.    Plan:   Patient Instructions   I cannot prescribe you the potassium until you get your lab work checked.  I have placed the order for this.    I have refilled your Zofran to use as needed for nausea and vomiting.    Please call your behavioral health specialist in regards to the Abilify side effects.    I have given you a short supply of the Soma as this should not be used longer than 3 weeks.    I have also refilled your Robitussin, do not take this in the stoma close together as this may increase sedation.    Please go the emergency department you develop any worrisome symptoms, worsening diarrhea, worsening vomiting, unable to keep any fluids or liquids down.       Requested Prescriptions     Signed Prescriptions Disp Refills   . ondansetron (Zofran ODT) 4 MG disintegrating tablet 30 tablet 0     Sig: Take 1 tablet (4 mg total) by mouth every 8 (eight) hours as needed for Nausea   . guaiFENesin-codeine (ROBITUSSIN AC) 100-10 MG/5ML syrup 25 mL 0     Sig: Take 5 mLs by mouth nightly as needed for Cough   . carisoprodol (Soma) 350 MG tablet 20 tablet 0     Sig: Take 1 tablet (350  mg total) by mouth 2 (two) times daily as needed for Muscle spasms        Patient was counseled on possible medicine side effects which may include rash, swelling and/or stomach upset.  Patient was instructed to notify me or the ER if they experience problems.    Orders Placed This Encounter   Procedures   . Comprehensive metabolic panel     Standing Status:   Future     Standing Expiration Date:   01/13/2020     Order Specific Question:   Has the patient fasted?     Answer:   No        Before leaving the office, the patient was informed of all ordered diagnostic tests and consults.     If you have not heard back from [place] about test results in 14 days, please call the office at [phone].    Signed by: Volney Presser, NP    Supervising Doctor: Rogelio Seen, MD    The patient's electronic medical record was  reviewed, any changes in the past medical history, past surgical history, medications, diagnostic tests were noted, and the record was updated accordingly.     Discussed option available for my chart which provides electronic access to diagnostic results.

## 2019-01-13 NOTE — Telephone Encounter (Signed)
Left message regarding patient needs to have blood work done prior to potassium script being filled.

## 2019-01-13 NOTE — Patient Instructions (Addendum)
I cannot prescribe you the potassium until you get your lab work checked.  I have placed the order for this.    I have refilled your Zofran to use as needed for nausea and vomiting.    Please call your behavioral health specialist in regards to the Abilify side effects.    I have given you a short supply of the Soma as this should not be used longer than 3 weeks.    I have also refilled your Robitussin, do not take this in the Soma close together as this may increase sedation.    Please go the emergency department you develop any worrisome symptoms, worsening diarrhea, worsening vomiting, unable to keep any fluids or liquids down.

## 2019-01-14 ENCOUNTER — Ambulatory Visit: Payer: Medicare Other

## 2019-01-20 ENCOUNTER — Other Ambulatory Visit (INDEPENDENT_AMBULATORY_CARE_PROVIDER_SITE_OTHER): Payer: Self-pay | Admitting: Family

## 2019-02-05 ENCOUNTER — Encounter (RURAL_HEALTH_CENTER): Payer: Self-pay | Admitting: Family

## 2019-02-05 ENCOUNTER — Ambulatory Visit: Payer: Medicaid Other | Attending: Family | Admitting: Family

## 2019-02-05 DIAGNOSIS — F5101 Primary insomnia: Secondary | ICD-10-CM

## 2019-02-05 DIAGNOSIS — M797 Fibromyalgia: Secondary | ICD-10-CM

## 2019-02-05 DIAGNOSIS — R110 Nausea: Secondary | ICD-10-CM

## 2019-02-05 MED ORDER — PROCHLORPERAZINE MALEATE 5 MG PO TABS
5.0000 mg | ORAL_TABLET | Freq: Three times a day (TID) | ORAL | 0 refills | Status: DC | PRN
Start: 2019-02-05 — End: 2019-02-19

## 2019-02-05 MED ORDER — TIZANIDINE HCL 4 MG PO TABS
8.0000 mg | ORAL_TABLET | Freq: Three times a day (TID) | ORAL | 0 refills | Status: DC | PRN
Start: 2019-02-05 — End: 2019-03-21

## 2019-02-05 MED ORDER — TRAZODONE HCL 150 MG PO TABS
150.0000 mg | ORAL_TABLET | Freq: Every evening | ORAL | 0 refills | Status: DC
Start: 2019-02-05 — End: 2019-02-19

## 2019-02-05 NOTE — Progress Notes (Signed)
PROGRESS NOTE      Patient Name: [name]  Primary Care Physician: Kathi Simpers, NP      History of Presenting Illness:   [name] is a 34 y.o. female who presents to the office with   Chief Complaint   Patient presents with   . Nausea   . Emesis   . Abdominal Pain   . Chills     Patient complaints of nausea, vomiting.  Patient states she "is pretty sure she does not have a bowel obstruction".  Patient states she is passing gas, having a small amount of stool pass.  Patient states she has had abdominal pain that the pain is on the left side and right side.  Patient is seeing GI on June 8th at [facility].  Denies known fever as she has not had a thermometer.  She reports her abdomen is soft.  The pain comes and goes.  Patient has attempted using the Zofran ODT.  Patient states that normally Zofran works for her, but this is not.      Patient also requesting muscle relaxer refill. She takes the muscle relaxers for her fibromyalgia pain.  Patient to be seen by pain management on 02/10/2019.  Patient also requesting refill of Trazodone.      This visit was conducted with the use of a HIPPA compliant interactive audiovisual telecommunication that permitted real time communication between [employer] and myself, Darlen Round, FNP-C. Consent to participation and receive services was obtained,  the patient was located at home, while I was located at [facility] clinic.  This visit was changed from an in-person visit to a telehealth visit to lower the risk of exposure and / or spread of the current pandemic with the SARS CoV-2 virus. This is based on the guidelines from the [facility] and other health agencies.  Past Medical History:     Past Medical History:   Diagnosis Date   . Anxiety    . Bipolar 1 disorder    . Chronic abdominal pain    . Chronic obstructive pulmonary disease    . Fibromyalgia    . Gastroesophageal reflux disease    . Insomnia    . Seasonal allergic  rhinitis        Past Surgical History:     Past Surgical History:   Procedure Laterality Date   . APPENDECTOMY     . CHOLECYSTECTOMY     . COLON RESECTION     . COLON SURGERY     . HYSTERECTOMY      salpingo-oophorectomy R side    . LAPAROTOMY, ABDOMINAL EXPLORATION      The patient has had multiple prior abdominal explorations, including colon resection, ileostomy's, and surgery for reported Crohn's disease.         Family History:     Family History   Problem Relation Age of Onset   . Hypertension Mother    . Diabetes Mother    . Leukemia Father    . Liver disease Maternal Grandfather    . Hyperlipidemia Paternal Grandfather        Social History:     Social History     Tobacco Use   Smoking Status Current Every Day Smoker   . Packs/day: 1.00   . Types: Cigarettes   Smokeless Tobacco Never Used     Social History     Substance and Sexual Activity   Alcohol Use Never   . Frequency:  Never     Social History     Substance and Sexual Activity   Drug Use Not Currently    Comment: opiates -- last use 2013       Allergies:     Allergies   Allergen Reactions   . Fentanyl Anaphylaxis   . Reglan [Metoclopramide]    . Rocephin [Ceftriaxone]    . Tramadol    . Vancomycin        Medications:     Prior to Admission medications    Medication Sig Start Date End Date Taking? Authorizing Provider   albuterol sulfate HFA (PROVENTIL) 108 (90 Base) MCG/ACT inhaler Inhale 2 puffs into the lungs every 4 (four) hours as needed for Wheezing or Shortness of Breath 12/13/18 12/13/19 Yes Marja Kays T, NP   carisoprodol (Soma) 350 MG tablet Take 1 tablet (350 mg total) by mouth 2 (two) times daily as needed for Muscle spasms 01/13/19  Yes Wyatte Dames, Richarda Osmond, NP   clonazePAM (KlonoPIN) 0.5 MG tablet Take 1 tablet (0.5 mg total) by mouth 2 (two) times daily This is to be taken over by psychiatry on Nov 25, 2018 10/17/18 10/17/19 Yes Marja Kays T, NP   diphenoxylate-atropine (Lomotil) 2.5-0.025 MG per tablet Take 2 tablets by mouth 4 (four)  times daily 12/13/18 12/13/19 Yes Marja Kays T, NP   fluticasone-salmeterol (ADVAIR HFA) 230-21 MCG/ACT inhaler Inhale 2 puffs into the lungs 2 (two) times daily 12/13/18  Yes Sharp, Skyler T, NP   ibuprofen (ADVIL) 800 MG tablet Take 1 tablet (800 mg total) by mouth every 8 (eight) hours as needed for Pain 12/03/18  Yes Sharp, Skyler T, NP   ondansetron (Zofran ODT) 4 MG disintegrating tablet Take 1 tablet (4 mg total) by mouth every 8 (eight) hours as needed for Nausea 01/13/19  Yes Maleny Candy, Richarda Osmond, NP   pregabalin (LYRICA) 100 MG capsule Take 1 capsule (100 mg total) by mouth 2 (two) times daily 11/15/18 11/15/19 Yes Marja Kays T, NP   traZODone (DESYREL) 150 MG tablet Take 1-2 tabs PO PRN at HS. 12/13/18  Yes Sharp, Skyler T, NP   venlafaxine (EFFEXOR-XR) 37.5 MG 24 hr capsule Take 37.5 mg by mouth 01/21/19  Yes [provider]   ARIPiprazole (ABILIFY) 5 MG tablet Take 10 mg by mouth 01/02/19 02/05/19  [provider]   guaiFENesin-codeine (ROBITUSSIN AC) 100-10 MG/5ML syrup Take 5 mLs by mouth nightly as needed for Cough 01/13/19 02/05/19  Chelsy Parrales, Richarda Osmond, NP   lithium 300 MG capsule Take 300 mg by mouth 01/21/19 02/05/19  [provider]       Review of Systems:      Constitutional: Negative for fever, fatigue or weight changes.   HENT: Negative for ear pain, congestion, rhinorrhea and neck pain.    Eyes: Negative for discharge, redness and itching.   Respiratory: Negative for cough and shortness of breath.    Cardiovascular: Negative for chest pain, palpitations and leg swelling.   Gastrointestinal: Positive for nausea, vomiting, intermittent abdominal pain.    Genitourinary: Negative for dysuria, urgency and difficulty urinating.   Endocrine: Negative for polyuria, polydipsia, polyphagia, or heat/cold intolerances.  Musculoskeletal: Negative for joint swelling and gait problem.   Neurological: Negative for dizziness, weakness and headaches.   Skin: Negative for rashes or  lesions.  Psychiatric/Behavioral: Negative.      Physical Exam:   There were no vitals filed for this visit.  There is no height or weight on file to calculate  BMI.    General:  no acute distress. Pleasant and well groomed.  Neck: no JVD, no tracheal deviation  Lungs: Effort normal, no use accessory muscles.  Neuro:  alert and oriented.   Skin: no rashes or lesions noted  Psychiatric/Behavioral: appropriate affect, mood and behavior.      No results found for this or any previous visit (from the past 336 hour(s)).   Physical Exam     Assessment:     1. Nausea    2. Primary insomnia    3. Fibromyalgia          Medical Decision Making:  Reviewed and patient in depth that she cannot be prescribed Soma any longer she has had to prescriptions filled for this. I have prescribed her a short course of tizanidine. I did read through the patient's behavioral health notes when she saw [facility], she was on Abilify and Seroquel but due to having nightmares and side effects she went off both of these. She found the trazodone works best for her insomnia. I discussed with her I did fill a 30-day supply for her until she follows back up with either behavioral health or her primary care nurse practitioner Skyler.  For the patient's nausea she states the Zofran is not working, I offered her Phenergan suppositories but she says Phenergan does not work for her. She states that Compazine has worked the best in the past when she has these episodes. I did a short supply of Compazine as this cannot be taken chronically, reviewed with her not to take Zofran. Reviewed with her thoroughly if her pain or nausea would worsen in any way she needs to be evaluated by the emergency department as I'm very limited since I cannot examine her. Patient is agreeable and understanding denies further questions.    I have provided the patient with the plan of care.  The patient denies any further questions, will follow up as needed.     Plan:   Patient Instructions   As discussed if your pain or nausea would worsen in any way you need to go to the emergency department for evaluation of a bowel obstruction.    I have done a short supply of Compazine as this not is not a medication that you can be on chronically.  Do not take the Zofran with it.    I have done a short supply of the tizanidine as you follow with pain management next week.    I have also refilled the trazodone for 30 days.       Requested Prescriptions     Signed Prescriptions Disp Refills   . prochlorperazine (COMPAZINE) 5 MG tablet 10 tablet 0     Sig: Take 1 tablet (5 mg total) by mouth every 8 (eight) hours as needed for Nausea   . tiZANidine (ZANAFLEX) 4 MG tablet 30 tablet 0     Sig: Take 2 tablets (8 mg total) by mouth every 8 (eight) hours as needed (fibromyalgia pain)   . traZODone (DESYREL) 150 MG tablet 30 tablet 0     Sig: Take 1 tablet (150 mg total) by mouth nightly        Patient was counseled on possible medicine side effects which may include rash, swelling and/or stomach upset.  Patient was instructed to notify me or the ER if they experience problems.    No orders of the defined types were placed in this encounter.       Before  leaving the office, the patient was informed of all ordered diagnostic tests and consults.     If you have not heard back from [place] about test results in 14 days, please call the office at [phone].    Signed by: Volney Presser, NP    Supervising Doctor: Rogelio Seen, MD    The patient's electronic medical record was reviewed, any changes in the past medical history, past surgical history, medications, diagnostic tests were noted, and the record was updated accordingly.     Discussed option available for my chart which provides electronic access to diagnostic results.

## 2019-02-05 NOTE — Patient Instructions (Signed)
As discussed if your pain or nausea would worsen in any way you need to go to the emergency department for evaluation of a bowel obstruction.    I have done a short supply of Compazine as this not is not a medication that you can be on chronically.  Do not take the Zofran with it.    I have done a short supply of the tizanidine as you follow with pain management next week.    I have also refilled the trazodone for 30 days.

## 2019-02-06 ENCOUNTER — Encounter (RURAL_HEALTH_CENTER): Payer: Self-pay | Admitting: Family

## 2019-02-19 ENCOUNTER — Encounter (RURAL_HEALTH_CENTER): Payer: Self-pay | Admitting: Nurse Practitioner

## 2019-02-19 ENCOUNTER — Ambulatory Visit: Payer: Medicaid Other | Attending: Nurse Practitioner | Admitting: Nurse Practitioner

## 2019-02-19 DIAGNOSIS — B850 Pediculosis due to Pediculus humanus capitis: Secondary | ICD-10-CM

## 2019-02-19 DIAGNOSIS — I959 Hypotension, unspecified: Secondary | ICD-10-CM

## 2019-02-19 DIAGNOSIS — R110 Nausea: Secondary | ICD-10-CM

## 2019-02-19 MED ORDER — PROCHLORPERAZINE MALEATE 5 MG PO TABS
5.0000 mg | ORAL_TABLET | Freq: Two times a day (BID) | ORAL | 0 refills | Status: DC | PRN
Start: 2019-02-19 — End: 2019-03-06

## 2019-02-19 MED ORDER — PERMETHRIN 1 % EX LOTN
TOPICAL_LOTION | Freq: Once | CUTANEOUS | 0 refills | Status: DC
Start: 2019-02-19 — End: 2019-09-11

## 2019-02-19 NOTE — Progress Notes (Signed)
Patient Name: [name],[name]    Subjective   History of Present Illness:   [name] is a 34 y.o. female who presents with:  Chief Complaint   Patient presents with    Hypotension    Head Lice     This visit was conducted with the use of interactive audio / video telecommunication that permitted real time communication between [employer], and myself. She consented to participation and received services through videoconferencing while I was located at [facility].    Hypotension:  [name] reports hypotension. Most recently her BP was 80/35. She reports hypotension for "maybe a week." She states that her BP is low "pretty much all day." She drinks a lot of fluid, but reports feeling dehydrated all the time. She states she is still experiencing diarrhea secondary to empty gut syndrome on a daily basis. She reports feeling dizzy and like she could pass out, but she has had no syncope at this time. She reports no recent changes to her medications. She requests a refill of her "nausea medicine" today.     Lice:  [name] reports she has lice present on her scalp. She requests a prescription of lice shampoo at this time.        Problem List:     Patient Active Problem List   Diagnosis    PCOS (polycystic ovarian syndrome)    Bipolar disorder    Chronic hepatitis C    Crohn's disease    Fibromyalgia    Grand mal status    Human papilloma virus infection    Insomnia    Encounter for long-term (current) use of high-risk medication    Bipolar 2 disorder, major depressive episode    GAD (generalized anxiety disorder)    Small bowel obstruction    Bipolar disorder, in partial remission, most recent episode mixed    Moderate episode of recurrent major depressive disorder          Medications:     Prior to Admission medications    Medication Sig Start Date End Date Taking? Authorizing Provider   albuterol sulfate HFA (PROVENTIL) 108 (90 Base)  MCG/ACT inhaler Inhale 2 puffs into the lungs every 4 (four) hours as needed for Wheezing or Shortness of Breath 12/13/18 12/13/19  Marja Kays T, NP   clonazePAM (KlonoPIN) 0.5 MG tablet Take 1 tablet (0.5 mg total) by mouth 2 (two) times daily This is to be taken over by psychiatry on Nov 25, 2018 10/17/18 10/17/19  Marja Kays T, NP   diphenoxylate-atropine (Lomotil) 2.5-0.025 MG per tablet Take 2 tablets by mouth 4 (four) times daily 12/13/18 12/13/19  Marja Kays T, NP   fluticasone-salmeterol (ADVAIR HFA) 230-21 MCG/ACT inhaler Inhale 2 puffs into the lungs 2 (two) times daily 12/13/18   Marja Kays T, NP   ibuprofen (ADVIL) 800 MG tablet Take 1 tablet (800 mg total) by mouth every 8 (eight) hours as needed for Pain 12/03/18   Ellene Bloodsaw T, NP   ondansetron (Zofran ODT) 4 MG disintegrating tablet Take 1 tablet (4 mg total) by mouth every 8 (eight) hours as needed for Nausea 01/13/19   Deviers, Richarda Osmond, NP   pregabalin (LYRICA) 100 MG capsule Take 1 capsule (100 mg total) by mouth 2 (two) times daily 11/15/18 11/15/19  Marja Kays T, NP   prochlorperazine (COMPAZINE) 5 MG tablet Take 1 tablet (5 mg total) by mouth every 8 (eight) hours as needed for Nausea 02/05/19  Deviers, Richarda Osmond, NP   tiZANidine (ZANAFLEX) 4 MG tablet Take 2 tablets (8 mg total) by mouth every 8 (eight) hours as needed (fibromyalgia pain) 02/05/19   Deviers, Richarda Osmond, NP   traZODone (DESYREL) 150 MG tablet Take 1-2 tabs PO PRN at HS. 12/13/18   Marja Kays T, NP   traZODone (DESYREL) 150 MG tablet Take 1 tablet (150 mg total) by mouth nightly 02/05/19   Deviers, Richarda Osmond, NP   venlafaxine (EFFEXOR-XR) 37.5 MG 24 hr capsule Take 37.5 mg by mouth 01/21/19   [provider]        Review of Systems:   14 system review negative except as noted in the HPI.    Physical Exam:      No vitals due to telephonic communication.     General:  no acute distress. Pleasant and well groomed.  Neck: no JVD, no tracheal  deviation  Lungs: Effort normal, no use accessory muscles.  Neuro:  alert and oriented.   Skin: no rashes or lesions noted  Psychiatric/Behavioral: appropriate affect, mood and behavior.       Assessment:     1. Head lice    2. Nausea    3. Hypotension, unspecified hypotension type        Plan:   MDM:  Hypotension: recommended calling 911 to go to the emergency department for possible dehydration. Once properly hydrated my plan is to look at medications that can cause hypotension. This was not the first choice as the patient has been on this medication for several years. Also consider illegal drug use as a differential of cause of hypotension.     Requested Prescriptions     Signed Prescriptions Disp Refills    prochlorperazine (COMPAZINE) 5 MG tablet 20 tablet 0     Sig: Take 1 tablet (5 mg total) by mouth 2 (two) times daily as needed for Nausea    permethrin (ELIMITE) 1 % lotion 120 mL 0     Sig: Apply topically once for 1 dose Shampoo, rinse and towel dry hair, saturate hair and scalp with permethrin. Rinse after 10 min; repeat in 1 week if needed       Patient Instructions   I recommend you go to the Emergency Department for further care of  low BP levels. I do not want to change your medications until you are less dehydrated and have been evaluated by an ED physician.     Reviewed medication list with patient. All medications are correct as stated as of today, 02/19/2019.     Refill of  Compazine prescribed as requested.     Prescribed Elimite 1% lotion for lice management.         Simple Ways to Avoid COVID-19  The COVID-19 pandemic is sweeping the world. We all want to avoid this dangerous virus. Let's look at some simple ways to lower your risk of infection.  Wash your hands!  First, we know that washing hands is one of the best ways to keep this virus from spreading. So wash your hands with soap and water, and wash them often.  Lather for at least 20 seconds. Always wash your hands after you go to the  bathroom. Wash them after you blow your nose. Wash them after sneezing or coughing. Wash them before you prepare food. And wash them before you eat. If you don't have a place to wash your hands, hand sanitizer is an option. Use one that contains at least 60  percent alcohol.  Cover coughs and sneezes!  Have the people around you cover their coughs and sneezes. Make sure they cough or sneeze into a tissue, and then throw it in the trash. If they don't have a tissue, they should cough or sneeze into their elbow.  Don't touch your face!  Next, try to avoid touching your face. Why? Because when you touch your face, you can move the virus from your hands to your eyes, nose or mouth. These are all routes the virus uses to get into your body. So even though it's not easy, try to keep your hands away from your face.  Avoid others, as much as possible!  And finally, stay away from others. Avoid groups of people. When you need to be in public, keep your distance from others. And if you do feel sick, stay home. We know the virus spreads from person to person, so staying away from others keeps [place] all safer.      To learn more about the COVID-19 vaccine, find out if you qualify for a vaccine at this time, or schedule a vaccine visit, please visit [facility] website for vaccination administration at:  [email or web address]    If you are unable to access this links via a computer or smart phone, please call [phone] to be connected with a vaccine clinic nearest you.          Return if symptoms worsen or fail to improve.    Signed by: Kathi Simpers, NP     Supervising Doctor: Rogelio Seen, MD    By signing my name below, I, Dorise Hiss, attest that this documentation has been prepared under the direction and in the presence of Darenda Fike, FNP-C.  Electronically Signed: Dorise Hiss Scribe.     I, Marja Kays, FNP-C, personally performed the services described in this  documentation. All medical record entries made by the scribe were at my direction and in my presence. I have reviewed the chart and plan instructions and agree that the record reflects my personal performance and is accurate and complete.

## 2019-02-19 NOTE — Patient Instructions (Addendum)
I recommend you go to the Emergency Department for further care of  low BP levels. I do not want to change your medications until you are less dehydrated and have been evaluated by an ED physician.     Reviewed medication list with patient. All medications are correct as stated as of today, 02/19/2019.     Refill of  Compazine prescribed as requested.     Prescribed Elimite 1% lotion for lice management.         Simple Ways to Avoid COVID-19  The COVID-19 pandemic is sweeping the world. We all want to avoid this dangerous virus. Let's look at some simple ways to lower your risk of infection.  Wash your hands!  First, we know that washing hands is one of the best ways to keep this virus from spreading. So wash your hands with soap and water, and wash them often.  Lather for at least 20 seconds. Always wash your hands after you go to the bathroom. Wash them after you blow your nose. Wash them after sneezing or coughing. Wash them before you prepare food. And wash them before you eat. If you don't have a place to wash your hands, hand sanitizer is an option. Use one that contains at least 60 percent alcohol.  Cover coughs and sneezes!  Have the people around you cover their coughs and sneezes. Make sure they cough or sneeze into a tissue, and then throw it in the trash. If they don't have a tissue, they should cough or sneeze into their elbow.  Don't touch your face!  Next, try to avoid touching your face. Why? Because when you touch your face, you can move the virus from your hands to your eyes, nose or mouth. These are all routes the virus uses to get into your body. So even though it's not easy, try to keep your hands away from your face.  Avoid others, as much as possible!  And finally, stay away from others. Avoid groups of people. When you need to be in public, keep your distance from others. And if you do feel sick, stay home. We know the virus spreads from person to person, so staying away from others keeps [place]  all safer.      To learn more about the COVID-19 vaccine, find out if you qualify for a vaccine at this time, or schedule a vaccine visit, please visit [facility] website for vaccination administration at:  [email or web address]    If you are unable to access this links via a computer or smart phone, please call [phone] to be connected with a vaccine clinic nearest you.

## 2019-02-27 ENCOUNTER — Ambulatory Visit (RURAL_HEALTH_CENTER): Payer: Self-pay

## 2019-02-28 ENCOUNTER — Ambulatory Visit: Payer: Medicaid Other | Attending: Nurse Practitioner | Admitting: Nurse Practitioner

## 2019-02-28 ENCOUNTER — Encounter (RURAL_HEALTH_CENTER): Payer: Self-pay | Admitting: Nurse Practitioner

## 2019-02-28 DIAGNOSIS — K912 Postsurgical malabsorption, not elsewhere classified: Secondary | ICD-10-CM

## 2019-02-28 DIAGNOSIS — R200 Anesthesia of skin: Secondary | ICD-10-CM

## 2019-02-28 NOTE — Patient Instructions (Addendum)
We make every attempt to respond to your calls and emails within 72 hours.  Please note that this is only during business hours.  If you have a question or a problem that needs attention sooner, and it is after hours please call the physician on-call at [phone], these services begin after 5PM on weekdays.  If you have an emergency, call 911.     You decided during the visit that you would seek higher level of care. Suggestion to call 911 you declined and will wait for your partner to take you to the local emergency department.     Follow-up as needed.

## 2019-02-28 NOTE — Progress Notes (Signed)
Patient Name: [name],[name]    Subjective   History of Present Illness:   [name] is a 34 y.o. female who presents with:  Chief Complaint   Patient presents with    Shoulder Pain     right shoulder pain, x 4-5 days     Abdominal Pain     x 1 day     Nausea     Shoulder Pain:  Ms. Denunzio reports she has been feeling pain in her RIGHT shoulder for the past 4-5 days. She states "I think I have a pinched nerve in it.  I just woke up and my face was numb."  She reports she popped her shoulder once, and the shoulder pain and facial numbness decreased, but did not go away completely. She reports she sleeps on her RIGHT side, but denies any injury to the area.  She reports her hand also became so swollen that she couldn't close her hand.  She also states her daughter told her her face was swelling as well where it feels numb.    GI concerns:  Ms. Mueck also c/o abdominal pain and nausea, which she has been experiencing since yesterday (02/27/2019). She reports a family member is going to take her to the Emergency Department "in a little bit once they get back from picking up their child from school."      This visit was conducted with the use of interactive audio / video telecommunication that permitted real time communication between [employer], and myself. She consented to participation and received services through videoconferencing while I was located at [facility].         Problem List:     Patient Active Problem List   Diagnosis    PCOS (polycystic ovarian syndrome)    Bipolar disorder    Chronic hepatitis C    Crohn's disease    Fibromyalgia    Grand mal status    Human papilloma virus infection    Insomnia    Encounter for long-term (current) use of high-risk medication    Bipolar 2 disorder, major depressive episode    GAD (generalized anxiety disorder)    Small bowel obstruction    Bipolar disorder, in partial remission, most  recent episode mixed    Moderate episode of recurrent major depressive disorder          Medications:     Prior to Admission medications    Medication Sig Start Date End Date Taking? Authorizing Provider   albuterol sulfate HFA (PROVENTIL) 108 (90 Base) MCG/ACT inhaler Inhale 2 puffs into the lungs every 4 (four) hours as needed for Wheezing or Shortness of Breath 12/13/18 12/13/19 Yes Marja Kays T, NP   clonazePAM (KlonoPIN) 0.5 MG tablet Take 1 tablet (0.5 mg total) by mouth 2 (two) times daily This is to be taken over by psychiatry on Nov 25, 2018 10/17/18 10/17/19 Yes Marja Kays T, NP   diphenoxylate-atropine (Lomotil) 2.5-0.025 MG per tablet Take 2 tablets by mouth 4 (four) times daily 12/13/18 12/13/19 Yes Marja Kays T, NP   fluticasone-salmeterol (ADVAIR HFA) 230-21 MCG/ACT inhaler Inhale 2 puffs into the lungs 2 (two) times daily 12/13/18  Yes Zarin Knupp T, NP   pregabalin (LYRICA) 100 MG capsule Take 1 capsule (100 mg total) by mouth 2 (two) times daily 11/15/18 11/15/19 Yes Emilyanne Mcgough T, NP   prochlorperazine (COMPAZINE) 5 MG tablet Take 1 tablet (5 mg total) by mouth 2 (two) times daily  as needed for Nausea 02/19/19  Yes Mykenzi Vanzile T, NP   tiZANidine (ZANAFLEX) 4 MG tablet Take 2 tablets (8 mg total) by mouth every 8 (eight) hours as needed (fibromyalgia pain) 02/05/19  Yes Deviers, Richarda Osmond, NP   traZODone (DESYREL) 150 MG tablet Take 1-2 tabs PO PRN at HS. 12/13/18  Yes Ike Maragh T, NP   venlafaxine (EFFEXOR-XR) 37.5 MG 24 hr capsule Take 37.5 mg by mouth 01/21/19  Yes [provider]        Review of Systems:     14 system reiew negative except as noted in the HPI.    Physical Exam:      No vitals due to telephonic communication.     General:  no acute distress. Pleasant and well groomed.  Neck: no JVD, no tracheal deviation  Lungs: Effort normal, no use accessory muscles.  Neuro:  alert and oriented.   Skin: no rashes or lesions noted  Psychiatric/Behavioral: appropriate  affect, mood and behavior.       Assessment:     1. Right facial numbness    2. Short gut syndrome        Plan:     Patient Instructions   We make every attempt to respond to your calls and emails within 72 hours.  Please note that this is only during business hours.  If you have a question or a problem that needs attention sooner, and it is after hours please call the physician on-call at [phone], these services begin after 5PM on weekdays.  If you have an emergency, call 911.     You decided during the visit that you would seek higher level of care. Suggestion to call 911 you declined and will wait for your partner to take you to the local emergency department.     Follow-up as needed.        Return if symptoms worsen or fail to improve.    Signed by: Kathi Simpers, NP     Supervising Doctor: Rogelio Seen, MD    By signing my name below, I, Dareen Piano, attest that this documentation has been prepared under the direction and in the presence of Aralynn Brake, FNP-C.  Electronically Signed: Dareen Piano, Scribe.     I, Marja Kays, FNP-C, personally performed the services described in this documentation. All medical record entries made by the scribe were at my direction and in my presence. I have reviewed the chart and plan instructions and agree that the record reflects my personal performance and is accurate and complete.

## 2019-03-03 ENCOUNTER — Other Ambulatory Visit (RURAL_HEALTH_CENTER): Payer: Self-pay

## 2019-03-03 ENCOUNTER — Encounter (RURAL_HEALTH_CENTER): Payer: Self-pay | Admitting: Nurse Practitioner

## 2019-03-03 DIAGNOSIS — K912 Postsurgical malabsorption, not elsewhere classified: Secondary | ICD-10-CM

## 2019-03-04 MED ORDER — DIPHENOXYLATE-ATROPINE 2.5-0.025 MG PO TABS
2.0000 | ORAL_TABLET | Freq: Four times a day (QID) | ORAL | 2 refills | Status: DC
Start: 2019-03-04 — End: 2019-05-03

## 2019-03-06 ENCOUNTER — Ambulatory Visit: Payer: Medicaid Other | Attending: Nurse Practitioner | Admitting: Nurse Practitioner

## 2019-03-06 ENCOUNTER — Encounter (RURAL_HEALTH_CENTER): Payer: Self-pay | Admitting: Nurse Practitioner

## 2019-03-06 VITALS — Resp 18 | Ht 65.0 in | Wt 119.0 lb

## 2019-03-06 DIAGNOSIS — R110 Nausea: Secondary | ICD-10-CM

## 2019-03-06 DIAGNOSIS — R634 Abnormal weight loss: Secondary | ICD-10-CM

## 2019-03-06 DIAGNOSIS — B850 Pediculosis due to Pediculus humanus capitis: Secondary | ICD-10-CM

## 2019-03-06 DIAGNOSIS — K50918 Crohn's disease, unspecified, with other complication: Secondary | ICD-10-CM

## 2019-03-06 DIAGNOSIS — K912 Postsurgical malabsorption, not elsewhere classified: Secondary | ICD-10-CM

## 2019-03-06 DIAGNOSIS — R197 Diarrhea, unspecified: Secondary | ICD-10-CM

## 2019-03-06 MED ORDER — IVERMECTIN 3 MG PO TABS
200.0000 ug/kg | ORAL_TABLET | ORAL | 0 refills | Status: DC
Start: 2019-03-06 — End: 2019-03-11

## 2019-03-06 MED ORDER — ONDANSETRON 4 MG PO TBDP
4.00 mg | ORAL_TABLET | Freq: Three times a day (TID) | ORAL | 0 refills | Status: DC | PRN
Start: 2019-03-06 — End: 2019-05-23

## 2019-03-06 NOTE — Progress Notes (Signed)
Patient Name: Daigler,Len MARIE    Subjective   History of Present Illness:   Bob Zeyah Cobo is a 34 y.o. female who presents with:  Chief Complaint   Patient presents with    Anorexia    Head Lice     This visit was conducted with the use of interactive audio / video telecommunication that permitted real time communication between [employer] and myself. She consented to participation and received services through videoconferencing while I was located at [facility].    Weight loss/GI problem:  Patient reports her appetite has dropped a lot and she has lost a significant amount of weight. She reports she is down to 119 lbs (approximately 14% weight loss in 2 months) and she does not see her GI physician until June 2021. 24 hour diet recall included: a smoothie with muscle milk, oatmeal, club sandwich, chips. States she does not have issues eating or swallowing food. She is able to eat but cannot keep any weight on. She is still having diarrhea due to short gut syndrome and chron's disease. She continues to use daily lomotil, anti-emetic (she has tried zofran, compazine, and phenergan). She has had multiple GI special appointments and she has canceled these appointments. She reports these were not the right doctor that she needs but is now willing to see if she can see someone sooner that is closer.     Head Lice:  Patient reports the lice medication is not covered by her insurance and she would like another option that would be covered. She has had this issues since late January. States the entire house hold have lice and have tried Nix over the counter medication but it is not working for them and it has never worked for her so she did not try it.        Problem List:     Patient Active Problem List   Diagnosis    PCOS (polycystic ovarian syndrome)    Bipolar disorder    Chronic hepatitis C    Crohn's disease    Fibromyalgia    Grand mal status    Human  papilloma virus infection    Insomnia    Encounter for long-term (current) use of high-risk medication    Bipolar 2 disorder, major depressive episode    GAD (generalized anxiety disorder)    Small bowel obstruction    Bipolar disorder, in partial remission, most recent episode mixed    Moderate episode of recurrent major depressive disorder          Medications:     Prior to Admission medications    Medication Sig Start Date End Date Taking? Authorizing Provider   albuterol sulfate HFA (PROVENTIL) 108 (90 Base) MCG/ACT inhaler Inhale 2 puffs into the lungs every 4 (four) hours as needed for Wheezing or Shortness of Breath 12/13/18 12/13/19 Yes Marja Kays T, NP   clonazePAM (KlonoPIN) 0.5 MG tablet Take 1 tablet (0.5 mg total) by mouth 2 (two) times daily This is to be taken over by psychiatry on Nov 25, 2018 10/17/18 10/17/19 Yes Marja Kays T, NP   diphenoxylate-atropine (Lomotil) 2.5-0.025 MG per tablet Take 2 tablets by mouth 4 (four) times daily 03/04/19 03/03/20 Yes Marja Kays T, NP   fluticasone-salmeterol (ADVAIR HFA) 230-21 MCG/ACT inhaler Inhale 2 puffs into the lungs 2 (two) times daily 12/13/18  Yes Rashaan Wyles T, NP   pregabalin (LYRICA) 100 MG capsule Take 1 capsule (100 mg total) by mouth  2 (two) times daily 11/15/18 11/15/19 Yes Caprice Mccaffrey T, NP   prochlorperazine (COMPAZINE) 5 MG tablet Take 1 tablet (5 mg total) by mouth 2 (two) times daily as needed for Nausea 02/19/19  Yes Korryn Pancoast T, NP   tiZANidine (ZANAFLEX) 4 MG tablet Take 2 tablets (8 mg total) by mouth every 8 (eight) hours as needed (fibromyalgia pain) 02/05/19  Yes Deviers, Richarda Osmond, NP   traZODone (DESYREL) 150 MG tablet Take 1-2 tabs PO PRN at HS. 12/13/18  Yes Anthonny Schiller T, NP   venlafaxine (EFFEXOR-XR) 37.5 MG 24 hr capsule Take 37.5 mg by mouth 01/21/19  Yes [provider]        Review of Systems:   14 system review negative except as noted in the HPI.    Physical Exam:      Vitals:    03/06/19 1157    Resp: 18   Weight: 54 kg (119 lb)   Height: 1.651 m (5\' 5" )       General:  no acute distress. Pleasant and well groomed.  Neck: no JVD, no tracheal deviation  Lungs: Effort normal, no use accessory muscles.  Neuro:  alert and oriented.   Skin: no rashes or lesions noted  Psychiatric/Behavioral: appropriate affect, mood and behavior.       Assessment:     1. Head lice    2. Nausea    3. Short gut syndrome    4. Diarrhea, unspecified type    5. Weight loss        Plan:     Orders Placed This Encounter   Procedures    Ambulatory referral to Gastroenterology     Referral Priority:   Routine     Referral Type:   Consultation     Referral Reason:   Specialty Services Required     Requested Specialty:   Gastroenterology     Number of Visits Requested:   1       Requested Prescriptions     Signed Prescriptions Disp Refills    ivermectin (STROMECTOL) 3 MG Tab 7 tablet 0     Sig: Take 3.5 tablets (10,500 mcg total) by mouth once a week for 2 doses    ondansetron (Zofran ODT) 4 MG disintegrating tablet 30 tablet 0     Sig: Take 1 tablet (4 mg total) by mouth every 8 (eight) hours as needed for Nausea       Patient Instructions   We make every attempt to respond to your calls and emails within 72 hours.  Please note that this is only during business hours.  If you have a question or a problem that needs attention sooner, and it is after hours please call the physician on-call at [phone], these services begin after 5PM on weekdays.  If you have an emergency, call 911.     Referral placed to GI in [place]. Patient will be contacted by this specialists' office to schedule an appointment. PLEASE keep this appointment!       Head Lice    Lice are tiny insects about 1/4 inch in length. Head lice infect only the scalp. They make your scalp feel very itchy. Lice lay eggs that are called nits. They look like tiny white specs stuck to the hair. They dont brush away or wash off like dandruff. Lice are easily spread by  close contact with an infected person. They are also spread by sharing personal items such as hats, combs, brushes, towels, and  bedding. You don't get head lice from dirty hair or poor hygiene. Lice cant hop or fly, but they can crawl.   To live, adult lice must feed on blood. If lice fall off a person, they die within 1 to 2 days. They dont spread disease.   Head lice symptoms include:   Feeling that something is crawling in your hair   Itching caused by an allergic reaction to the saliva of lice. (Itching alone doesnt mean you have lice.)   Sores on your head (from scratching)   Seeing lice or nits  Home care  Head lice and nits dont wash off by cleaning your hair with regular shampoo. Treatment is needed if you see live lice. There are medicine and non-medicine treatments. If you only find nits, this doesnt mean you have an active infection needing treatment. The nits can stay after the lice are dead and gone.   As you treat your head lice, also follow these steps:    Machine-wash all your hats, scarves, coats, bed linens, and towels in hot water.   Use your dryers hot cycle to dry these items. Dry clean any clothing, bed linens, or stuffed animals that cant be washed this way. Or you can seal them in a plastic bag for 2 weeks. Lice will die during this time.   Combs, brushes, barrettes, hair ties, and curlers may be cleaned with a disinfectant or rubbing alcohol. Then rinse well with clean water.   Vacuum all rugs, carpets, and mattresses that were used while you were infected.   Sex partners and household members should be treated at the same time to prevent re-infection.   Don't have sexual contact until rechecked by your healthcare provider to confirm that all lice are gone.  Medicine  Both prescription and over-the-counter medicines are available. These include medicated creams, lotions, or shampoos. Prescription pills are also available. Medicines may not always destroy the eggs or nits. A  second treatment is usually advised 7 days later. If you see live lice after a second treatment, talk with your provider. Also talk with your provider if you find lice or nits in your eyebrows or eyelashes.   When treating lice with medicine:    Don't use the medicine around your eyes. If it gets in your eyes, wash them out thoroughly.   Don't use it inside your nose, ear, mouth, vagina, or on your eyebrows or eyelashes.   Pregnant or breastfeeding women and children younger than 48 years old should not use it until discussing it with your provider.  If your healthcare provider recommends a medicine, use it as follows:   1. Wash your hair with your regular shampoo.  2. Rinse with water and then towel dry. The towel will need to be washed, as there could be lice on it.  3. Put enough of the medicated cream rinse in to soak the entire hair and scalp area. This includes behind the ears and the back of the neck.  4. Rinse well after 10 minutes. Leaving it on longer will not make it work better.  5. Once you have washed the medicine out of the hair, use a special fine-toothed comb called a nit comb. This is designed to remove the lice and nits.  6. Stroke the comb through one section of hair at a time. Go from scalp to hair tip, cleaning the comb after each stroke.  Nonmedicine treatment  Medicines are usually the most effective treatment. But if you  don't want to use chemicals, there is a treatment called wet combing. This is a longer process. It can take as much as an hour each time to do it thoroughly. A special nit comb is needed.   Since no medicine was used to kill the lice, you will need to wet comb your hair a few times. Follow these steps:   1. Wash your hair as usual, using your regular shampoo.  2. Put on lots of conditioner.  3. Use a regular comb to untangle and straighten your hair.  4. Switch to the nit comb. Stroke the comb carefully through your hair. Go from the scalp to the tips of the  hair.  5. Remove any lice or nits by wiping or rinsing off the comb.  6. Do this through every part of your hair. Do a small area at a time. Don't miss any section.  7. When finished, rinse out the conditioner. Repeat the combing 3 more times.  Note: Repeat the wet-combing process every 3 to 4 days. Keep doing this until you don't see lice for 3 sessions in a row.   Medicines to treat symptoms  Itching probably causes the most discomfort. Over-the-counter antihistamines that have diphenhydramine are sold at pharmacies and grocery stores. Use an antihistamine in pill form, not a cream. If you were not given a prescription antihistamine, then you may use an over-the-counter version to reduce itching if large areas of the skin are involved. This medicine may make you sleepy. So use lower doses during the day and higher doses at bedtime. Some antihistamineswont make you so sleepy. They are a good choice for daytime use. Note: Dont use medicine that has diphenhydramine if you have glaucoma. Also dont use it if you are a man who has trouble urinating due to an enlarged prostate.   You may be given antibiotics for a bacterial infection. This is usually caused by scratching the scalp. Take the antibiotics until they are finished. Keep taking them even if the wound looks better. This helps make sure that the infection has cleared.   Follow-up care  Follow up with your healthcare provider, or as advised. Call your provider if you still have itching on your scalp or see live lice in your hair 7 days after the first treatment.   When to seek medical advice  Call your healthcare provider right away if any of these occur:    Itching gets worse and antihistamines don't help   Scalp becomes swollen or tender   Scalp sores are draining pus (a creamy yellow or white liquid)   Hair becomes matted or smells bad   Other signs of infection, like increasing redness, swelling, pain, or pus drainage   Trouble breathing  StayWell  last reviewed this educational content on 07/23/2017   2000-2020 [employer], [place]. [address] [place], [place] [address]. All rights reserved. This information is not intended as a substitute for professional medical care. Always follow your healthcare professional's instructions.                     No follow-ups on file.    Signed by: Kathi Simpers, NP     Supervising Doctor: Rogelio Seen, MD    By signing my name below, I, Phill Myron, attest that this documentation has been prepared under the direction and in the presence of Jovany Disano, FNP-C.  Electronically Signed: Phill Myron, Scribe.     I, Marja Kays, FNP-C,  personally performed the services described in this documentation. All medical record entries made by the scribe were at my direction and in my presence. I have reviewed the chart and plan instructions and agree that the record reflects my personal performance and is accurate and complete.

## 2019-03-06 NOTE — Patient Instructions (Addendum)
We make every attempt to respond to your calls and emails within 72 hours.  Please note that this is only during business hours.  If you have a question or a problem that needs attention sooner, and it is after hours please call the physician on-call at [phone], these services begin after 5PM on weekdays.  If you have an emergency, call 911.     Referral placed to GI in [place]. Patient will be contacted by this specialists' office to schedule an appointment. PLEASE keep this appointment!       Head Lice    Lice are tiny insects about 1/4 inch in length. Head lice infect only the scalp. They make your scalp feel very itchy. Lice lay eggs that are called nits. They look like tiny white specs stuck to the hair. They dont brush away or wash off like dandruff. Lice are easily spread by close contact with an infected person. They are also spread by sharing personal items such as hats, combs, brushes, towels, and bedding. You don't get head lice from dirty hair or poor hygiene. Lice cant hop or fly, but they can crawl.   To live, adult lice must feed on blood. If lice fall off a person, they die within 1 to 2 days. They dont spread disease.   Head lice symptoms include:   Feeling that something is crawling in your hair   Itching caused by an allergic reaction to the saliva of lice. (Itching alone doesnt mean you have lice.)   Sores on your head (from scratching)   Seeing lice or nits  Home care  Head lice and nits dont wash off by cleaning your hair with regular shampoo. Treatment is needed if you see live lice. There are medicine and non-medicine treatments. If you only find nits, this doesnt mean you have an active infection needing treatment. The nits can stay after the lice are dead and gone.   As you treat your head lice, also follow these steps:    Machine-wash all your hats, scarves, coats, bed linens, and towels in hot water.   Use your dryers hot cycle to dry these items. Dry clean any  clothing, bed linens, or stuffed animals that cant be washed this way. Or you can seal them in a plastic bag for 2 weeks. Lice will die during this time.   Combs, brushes, barrettes, hair ties, and curlers may be cleaned with a disinfectant or rubbing alcohol. Then rinse well with clean water.   Vacuum all rugs, carpets, and mattresses that were used while you were infected.   Sex partners and household members should be treated at the same time to prevent re-infection.   Don't have sexual contact until rechecked by your healthcare provider to confirm that all lice are gone.  Medicine  Both prescription and over-the-counter medicines are available. These include medicated creams, lotions, or shampoos. Prescription pills are also available. Medicines may not always destroy the eggs or nits. A second treatment is usually advised 7 days later. If you see live lice after a second treatment, talk with your provider. Also talk with your provider if you find lice or nits in your eyebrows or eyelashes.   When treating lice with medicine:    Don't use the medicine around your eyes. If it gets in your eyes, wash them out thoroughly.   Don't use it inside your nose, ear, mouth, vagina, or on your eyebrows or eyelashes.   Pregnant or breastfeeding women and children  younger than 11 years old should not use it until discussing it with your provider.  If your healthcare provider recommends a medicine, use it as follows:   1. Wash your hair with your regular shampoo.  2. Rinse with water and then towel dry. The towel will need to be washed, as there could be lice on it.  3. Put enough of the medicated cream rinse in to soak the entire hair and scalp area. This includes behind the ears and the back of the neck.  4. Rinse well after 10 minutes. Leaving it on longer will not make it work better.  5. Once you have washed the medicine out of the hair, use a special fine-toothed comb called a nit comb. This is designed to remove  the lice and nits.  6. Stroke the comb through one section of hair at a time. Go from scalp to hair tip, cleaning the comb after each stroke.  Nonmedicine treatment  Medicines are usually the most effective treatment. But if you don't want to use chemicals, there is a treatment called wet combing. This is a longer process. It can take as much as an hour each time to do it thoroughly. A special nit comb is needed.   Since no medicine was used to kill the lice, you will need to wet comb your hair a few times. Follow these steps:   1. Wash your hair as usual, using your regular shampoo.  2. Put on lots of conditioner.  3. Use a regular comb to untangle and straighten your hair.  4. Switch to the nit comb. Stroke the comb carefully through your hair. Go from the scalp to the tips of the hair.  5. Remove any lice or nits by wiping or rinsing off the comb.  6. Do this through every part of your hair. Do a small area at a time. Don't miss any section.  7. When finished, rinse out the conditioner. Repeat the combing 3 more times.  Note: Repeat the wet-combing process every 3 to 4 days. Keep doing this until you don't see lice for 3 sessions in a row.   Medicines to treat symptoms  Itching probably causes the most discomfort. Over-the-counter antihistamines that have diphenhydramine are sold at pharmacies and grocery stores. Use an antihistamine in pill form, not a cream. If you were not given a prescription antihistamine, then you may use an over-the-counter version to reduce itching if large areas of the skin are involved. This medicine may make you sleepy. So use lower doses during the day and higher doses at bedtime. Some antihistamineswont make you so sleepy. They are a good choice for daytime use. Note: Dont use medicine that has diphenhydramine if you have glaucoma. Also dont use it if you are a man who has trouble urinating due to an enlarged prostate.   You may be given antibiotics for a bacterial infection.  This is usually caused by scratching the scalp. Take the antibiotics until they are finished. Keep taking them even if the wound looks better. This helps make sure that the infection has cleared.   Follow-up care  Follow up with your healthcare provider, or as advised. Call your provider if you still have itching on your scalp or see live lice in your hair 7 days after the first treatment.   When to seek medical advice  Call your healthcare provider right away if any of these occur:    Itching gets worse and antihistamines don't help  Scalp becomes swollen or tender   Scalp sores are draining pus (a creamy yellow or white liquid)   Hair becomes matted or smells bad   Other signs of infection, like increasing redness, swelling, pain, or pus drainage   Trouble breathing  StayWell last reviewed this educational content on 07/23/2017   2000-2020 [employer], [place]. [address], [place], [place] [address]. All rights reserved. This information is not intended as a substitute for professional medical care. Always follow your healthcare professional's instructions.

## 2019-03-07 ENCOUNTER — Emergency Department: Payer: Medicare Managed Care Other

## 2019-03-07 ENCOUNTER — Encounter (RURAL_HEALTH_CENTER): Payer: Self-pay | Admitting: Nurse Practitioner

## 2019-03-07 ENCOUNTER — Encounter: Payer: Self-pay | Admitting: Family

## 2019-03-07 ENCOUNTER — Emergency Department
Admission: EM | Admit: 2019-03-07 | Discharge: 2019-03-07 | Disposition: A | Discharge status: Other Acute Inpatient Hospital | Payer: Medicare Managed Care Other | Attending: General Practice | Admitting: General Practice

## 2019-03-07 DIAGNOSIS — M25511 Pain in right shoulder: Secondary | ICD-10-CM

## 2019-03-07 DIAGNOSIS — K56609 Unspecified intestinal obstruction, unspecified as to partial versus complete obstruction: Secondary | ICD-10-CM | POA: Insufficient documentation

## 2019-03-07 LAB — VH URINALYSIS WITH MICROSCOPIC AND CULTURE IF INDICATED
Bilirubin, UA: NEGATIVE mg/dL
Glucose, UA: NEGATIVE mg/dL
Ketones UA: NEGATIVE mg/dL
Leukocyte Esterase, UA: NEGATIVE Leu/uL
Nitrite, UA: POSITIVE — AB
Protein, UR: NEGATIVE mg/dL
Urine Specific Gravity: >=1.03 (ref 1.001–1.040)
Urobilinogen, UA: 0.2 mg/dL
pH, Urine: 6 pH (ref 5.0–8.0)

## 2019-03-07 LAB — COMPREHENSIVE METABOLIC PANEL
ALT: 12 U/L (ref 0–55)
AST (SGOT): 13 U/L (ref 10–42)
Albumin/Globulin Ratio: 1.38 Ratio (ref 0.80–2.00)
Albumin: 4 gm/dL (ref 3.5–5.0)
Alkaline Phosphatase: 94 U/L (ref 40–145)
Anion Gap: 12.4 mMol/L (ref 7.0–18.0)
BUN / Creatinine Ratio: 9.6 Ratio — ABNORMAL LOW (ref 10.0–30.0)
BUN: 8 mg/dL (ref 7–22)
Bilirubin, Total: 0.5 mg/dL (ref 0.1–1.2)
CO2: 21 mMol/L (ref 20.0–30.0)
Calcium: 8.6 mg/dL (ref 8.5–10.5)
Chloride: 108 mMol/L (ref 98–110)
Creatinine: 0.83 mg/dL (ref 0.60–1.20)
EGFR: 94 mL/min/{1.73_m2} (ref 60–150)
Globulin: 2.9 gm/dL (ref 2.0–4.0)
Glucose: 103 mg/dL — ABNORMAL HIGH (ref 71–99)
Osmolality Calculated: 274 mOsm/kg — ABNORMAL LOW (ref 275–300)
Potassium: 3.4 mMol/L — ABNORMAL LOW (ref 3.5–5.3)
Protein, Total: 6.9 gm/dL (ref 6.0–8.3)
Sodium: 138 mMol/L (ref 136–147)

## 2019-03-07 LAB — CBC AND DIFFERENTIAL
Basophils %: 0.5 % (ref 0.0–3.0)
Basophils Absolute: 0.1 10*3/uL (ref 0.0–0.3)
Eosinophils %: 1.2 % (ref 0.0–7.0)
Eosinophils Absolute: 0.2 10*3/uL (ref 0.0–0.8)
Hematocrit: 42.3 % (ref 36.0–48.0)
Hemoglobin: 13.8 gm/dL (ref 12.0–16.0)
Lymphocytes Absolute: 2.3 10*3/uL (ref 0.6–5.1)
Lymphocytes: 15.9 % (ref 15.0–46.0)
MCH: 32 pg (ref 28–35)
MCHC: 33 gm/dL (ref 31–36)
MCV: 98 fL (ref 80–100)
MPV: 9 fL (ref 6.0–10.0)
Monocytes Absolute: 1.1 10*3/uL (ref 0.1–1.7)
Monocytes: 7.3 % (ref 3.0–15.0)
Neutrophils %: 75.1 % (ref 42.0–78.0)
Neutrophils Absolute: 11.1 10*3/uL — ABNORMAL HIGH (ref 1.7–8.6)
PLT CT: 258 10*3/uL (ref 130–440)
RBC: 4.31 10*6/uL (ref 3.80–5.00)
RDW: 12.6 % (ref 10.5–14.5)
WBC: 14.8 10*3/uL — ABNORMAL HIGH (ref 4.0–11.0)

## 2019-03-07 LAB — HCG, SERUM, QUALITATIVE: BHCG Qualitative: NEGATIVE

## 2019-03-07 MED ORDER — SODIUM CHLORIDE 0.9 % IV BOLUS
1000.00 mL | Freq: Once | INTRAVENOUS | Status: AC
Start: 2019-03-07 — End: 2019-03-07
  Administered 2019-03-07: 20:00:00 1000 mL via INTRAVENOUS

## 2019-03-07 MED ORDER — ONDANSETRON HCL 4 MG/2ML IJ SOLN
INTRAMUSCULAR | Status: AC
Start: 2019-03-07 — End: ?
  Filled 2019-03-07: qty 2

## 2019-03-07 MED ORDER — ONDANSETRON HCL 4 MG/2ML IJ SOLN
4.00 mg | Freq: Once | INTRAMUSCULAR | Status: AC
Start: 2019-03-07 — End: 2019-03-07
  Administered 2019-03-07: 20:00:00 4 mg via INTRAVENOUS

## 2019-03-07 MED ORDER — KETOROLAC TROMETHAMINE 30 MG/ML IJ SOLN
INTRAMUSCULAR | Status: AC
Start: 2019-03-07 — End: ?
  Filled 2019-03-07: qty 1

## 2019-03-07 MED ORDER — KETOROLAC TROMETHAMINE 30 MG/ML IJ SOLN
15.00 mg | Freq: Once | INTRAMUSCULAR | Status: AC
Start: 2019-03-07 — End: 2019-03-07
  Administered 2019-03-07: 22:00:00 15 mg via INTRAVENOUS

## 2019-03-07 MED ORDER — IOHEXOL 350 MG/ML IV SOLN
90.00 mL | Freq: Once | INTRAVENOUS | Status: AC | PRN
Start: 2019-03-07 — End: 2019-03-07
  Administered 2019-03-07: 21:00:00 90 mL via INTRAVENOUS
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
## 2019-03-07 NOTE — ED Notes (Addendum)
Patient has been NPO since arrival to ED at 1840

## 2019-03-07 NOTE — ED Triage Notes (Signed)
Patient reports history of bowel obstruction, today vomitted and has had diarrhea due to short gut syndrome, patient c/o weakness and cannot keep any food down
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
## 2019-03-07 NOTE — ED Notes (Signed)
At patient request, car keys were placed into plastic bag with patient label applied as well as Tammi Sou name added to bag so that [name] can pick up her keys to her vehicle. Bag with keys then given to security.

## 2019-03-07 NOTE — ED Provider Notes (Signed)
History     Chief Complaint   Patient presents with    Abdominal Pain     HPI   34 year old female came to emergency room complaining of abdominal pain.  Patient states that it is possible an intestinal obstruction.  Abdominal pain is associated with nausea vomiting and diarrhea.  Patient had multiple abdominal surgery since 2010.  Patient has history of fibromyalgia, GERD, COPD, bipolar disorder.  Patient states that her last intestinal obstruction was 2016.  Past Medical History:   Diagnosis Date    Anxiety     Bipolar 1 disorder     Chronic abdominal pain     Chronic obstructive pulmonary disease     Fibromyalgia     Gastroesophageal reflux disease     Insomnia     Seasonal allergic rhinitis        Past Surgical History:   Procedure Laterality Date    APPENDECTOMY      CHOLECYSTECTOMY      COLON RESECTION      COLON SURGERY      LAPAROTOMY, ABDOMINAL EXPLORATION      The patient has had multiple prior abdominal explorations, including colon resection, ileostomy's, and surgery for reported Crohn's disease.         Family History   Problem Relation Age of Onset    Hypertension Mother     Diabetes Mother     Leukemia Father     Liver disease Maternal Grandfather     Hyperlipidemia Paternal Grandfather        Social  Social History     Tobacco Use    Smoking status: Current Every Day Smoker     Packs/day: 1.00     Types: Cigarettes    Smokeless tobacco: Never Used   Substance Use Topics    Alcohol use: Never     Frequency: Never    Drug use: Not Currently     Comment: opiates -- last use 2013       .     Allergies   Allergen Reactions    Fentanyl Anaphylaxis    Reglan [Metoclopramide]     Rocephin [Ceftriaxone]     Tramadol     Vancomycin        Home Medications     Med List Status: Complete Set By: Cindie Crumbly, RN at 03/07/2019  7:34 PM                albuterol sulfate HFA (PROVENTIL) 108 (90 Base) MCG/ACT inhaler     Inhale 2 puffs into the lungs every 4 (four) hours as  needed for Wheezing or Shortness of Breath     clonazePAM (KlonoPIN) 0.5 MG tablet     Take 1 tablet (0.5 mg total) by mouth 2 (two) times daily This is to be taken over by psychiatry on Nov 25, 2018     diphenoxylate-atropine (Lomotil) 2.5-0.025 MG per tablet     Take 2 tablets by mouth 4 (four) times daily     fluticasone-salmeterol (ADVAIR HFA) 230-21 MCG/ACT inhaler     Inhale 2 puffs into the lungs 2 (two) times daily     ivermectin (STROMECTOL) 3 MG Tab     Take 3.5 tablets (10,500 mcg total) by mouth once a week for 2 doses     ondansetron (Zofran ODT) 4 MG disintegrating tablet     Take 1 tablet (4 mg total) by mouth every 8 (eight) hours as needed for Nausea  pregabalin (LYRICA) 100 MG capsule     Take 1 capsule (100 mg total) by mouth 2 (two) times daily     tiZANidine (ZANAFLEX) 4 MG tablet     Take 2 tablets (8 mg total) by mouth every 8 (eight) hours as needed (fibromyalgia pain)     traZODone (DESYREL) 150 MG tablet     Take 1-2 tabs PO PRN at HS.     venlafaxine (EFFEXOR-XR) 37.5 MG 24 hr capsule     Take 37.5 mg by mouth           Review of Systems   Constitutional: Positive for activity change.   Gastrointestinal: Positive for abdominal pain, diarrhea, nausea and vomiting.   All other systems reviewed and are negative.      Physical Exam    BP: 106/76, Heart Rate: 94, Temp: 97.3 F (36.3 C), Resp Rate: 18, SpO2: 97 %, Weight: 54 kg    Physical Exam  Vitals signs and nursing note reviewed.   Constitutional:       General: She is not in acute distress.     Appearance: She is well-developed and normal weight. She is not ill-appearing, toxic-appearing or diaphoretic.   HENT:      Head: Normocephalic and atraumatic.      Mouth/Throat:      Mouth: Mucous membranes are moist.      Pharynx: No pharyngeal swelling or oropharyngeal exudate.   Cardiovascular:      Rate and Rhythm: Normal rate and regular rhythm.      Heart sounds: Normal heart sounds.   Pulmonary:      Effort: Pulmonary effort is normal. No  respiratory distress.      Breath sounds: Normal breath sounds. No stridor. No wheezing, rhonchi or rales.   Chest:      Chest wall: No tenderness.   Abdominal:      General: Abdomen is flat. Bowel sounds are increased.      Palpations: Abdomen is soft.      Tenderness: There is generalized abdominal tenderness. There is guarding.      Hernia: No hernia is present.   Skin:     General: Skin is warm.      Coloration: Skin is not cyanotic, jaundiced, mottled or pale.      Findings: No erythema or rash.   Neurological:      Mental Status: She is alert.           MDM and ED Course     ED Medication Orders (From admission, onward)    Start Ordered     Status Ordering Provider    03/07/19 2215 03/07/19 2214  ketorolac (TORADOL) injection 15 mg  Once in ED     Route: Intravenous  Ordered Dose: 15 mg     Last MAR action: Given Miquel Stacks M    03/07/19 2058 03/07/19 2059  iohexol (OMNIPAQUE) 350 MG/ML injection 90 mL  IMG once as needed     Route: Intravenous  Ordered Dose: 90 mL     Last MAR action: Imaging Agent Given Fredrik Rigger    03/07/19 2005 03/07/19 2004  ondansetron (ZOFRAN) injection 4 mg  Once in ED     Route: Intravenous  Ordered Dose: 4 mg     Last MAR action: Given Fredrik Rigger    03/07/19 2005 03/07/19 2004  sodium chloride 0.9 % bolus 1,000 mL  Once in ED     Route: Intravenous  Ordered Dose:  1,000 mL     Last MAR action: Allyson Sabal         Results     Procedure Component Value Units Date/Time    Urine Culture [[id number]] Collected: 03/07/19 2025    Specimen: Urine, Random Updated: 03/08/19 1244    Narrative:      Specimen: Urine, Random  Collected: 03/07/2019 20:25     Status: Valued      Last Updated: 03/08/2019 12:44                Culture Result (Prelim)      Culture In Progress          Urinalysis w Microscopic and Culture if Indicated [[id number]]  (Abnormal) Collected: 03/07/19 2025    Specimen: Urine, Random Updated: 03/07/19 2047     Color, UA Yellow     Clarity, UA Clear      Urine Specific Gravity >=1.030     pH, Urine 6.0 pH      Protein, UR Negative mg/dL      Glucose, UA Negative mg/dL      Ketones UA Negative mg/dL      Bilirubin, UA Negative mg/dL      Blood, UA 3+ mg/dL      Nitrite, UA Positive     Urobilinogen, UA 0.2 mg/dL      Leukocyte Esterase, UA Negative Leu/uL      UR Micro Performed     WBC, UA 10-15 /hpf      RBC, UA 10-15 /hpf      Bacteria, UA Many /hpf      Squam Epithel, UA 30-40 /lpf     Narrative:      A Urine Culture has been ordered based upon the Positive UA results.    Comprehensive metabolic panel [[id number]]  (Abnormal) Collected: 03/07/19 2014    Specimen: Plasma Updated: 03/07/19 2038     Sodium 138 mMol/L      Potassium 3.4 mMol/L      Chloride 108 mMol/L      CO2 21.0 mMol/L      Calcium 8.6 mg/dL      Glucose 086 mg/dL      Creatinine 5.78 mg/dL      BUN 8 mg/dL      Protein, Total 6.9 gm/dL      Albumin 4.0 gm/dL      Alkaline Phosphatase 94 U/L      ALT 12 U/L      AST (SGOT) 13 U/L      Bilirubin, Total 0.5 mg/dL      Albumin/Globulin Ratio 1.38 Ratio      Anion Gap 12.4 mMol/L      BUN / Creatinine Ratio 9.6 Ratio      EGFR 94 mL/min/1.98m2      Osmolality Calculated 274 mOsm/kg      Globulin 2.9 gm/dL     Beta HCG, Qual, Serum [[id number]] Collected: 03/07/19 2014    Specimen: Plasma Updated: 03/07/19 2031     BHCG Qualitative Negative    CBC and differential [[id number]]  (Abnormal) Collected: 03/07/19 2014    Specimen: Blood Updated: 03/07/19 2021     WBC 14.8 K/cmm      RBC 4.31 M/cmm      Hemoglobin 13.8 gm/dL      Hematocrit 27.2 %      MCV 98 fL      MCH 32 pg      MCHC 33 gm/dL  RDW 12.6 %      PLT CT 258 K/cmm      MPV 9.0 fL      Neutrophils % 75.1 %      Lymphocytes 15.9 %      Monocytes 7.3 %      Eosinophils % 1.2 %      Basophils % 0.5 %      Neutrophils Absolute 11.1 K/cmm      Lymphocytes Absolute 2.3 K/cmm      Monocytes Absolute 1.1 K/cmm      Eosinophils Absolute 0.2 K/cmm      Basophils Absolute 0.1 K/cmm         Ct Abdomen  Pelvis With Iv Cont    Result Date: 03/07/2019  Multifocal areas of anastomosis identified in the abdomen involving the small bowel and sigmoid colon. There is focal fluid filled distention of right upper quadrant small bowel loops measuring up to 4 cm, with tapering of fluid-filled small bowel loops to normal caliber in the region of the left lower quadrant where anastomosis sutures are also identified. This could represent a transition point. ReadingStation:[email or web address]      MDM  Review lab results and CT scan with patient, spoke to Dr. Loyal Gambler, he advised to send patient to [facility].  Talked to Dr. Emilio Math, he kindly accepted the patient to be transferred to [facility] for further evaluation and treatment.    ED Course as of Mar 08 1335   Fri Mar 07, 2019   2212 Dr Radene Gunning very kindly stayed past the end of his shift to arrange transfer of this patient with a bowel obstruction. I've entered this diagnosis and disposition in Epic after he left, but I have not seen the patient.    [RL]      ED Course User Index  [RL] Elenor Quinones, MD             Procedures    Clinical Impression & Disposition     Clinical Impression  Final diagnoses:   Intestinal obstruction, unspecified cause, unspecified whether partial or complete        ED Disposition     ED Disposition Condition Date/Time Comment    Transfer to Another Facility  Fri Mar 07, 2019 10:12 PM [name] should be transferred out to [facility].           Discharge Medication List as of 03/07/2019 11:50 PM                    [name], Jean Rosenthal, MD  03/08/19 1337

## 2019-03-07 NOTE — ED Notes (Signed)
Pt IV NS was finished, D/c'd IV line and flushed IV site. Provided two warm blankets. Informed primary RN. Bland Span NRP

## 2019-03-07 NOTE — ED Notes (Signed)
[facility] called and voicemail was reached, only open from 0800-1700 on weekdays. VMT gave after hours number and voicemail responded that that office is also not open. Primary RN informed of this. Bland Span NRP

## 2019-03-08 ENCOUNTER — Encounter: Payer: Self-pay | Admitting: Surgery

## 2019-03-08 ENCOUNTER — Inpatient Hospital Stay
Admission: EM | Admit: 2019-03-08 | Discharge: 2019-03-10 | DRG: 389 | Disposition: A | Discharge status: Home / Self Care | Payer: Medicare Managed Care Other | Source: Other Acute Inpatient Hospital | Attending: Surgery | Admitting: Surgery

## 2019-03-08 DIAGNOSIS — Z806 Family history of leukemia: Secondary | ICD-10-CM

## 2019-03-08 DIAGNOSIS — K219 Gastro-esophageal reflux disease without esophagitis: Secondary | ICD-10-CM | POA: Diagnosis present

## 2019-03-08 DIAGNOSIS — Z880 Allergy status to penicillin: Secondary | ICD-10-CM

## 2019-03-08 DIAGNOSIS — J449 Chronic obstructive pulmonary disease, unspecified: Secondary | ICD-10-CM | POA: Diagnosis present

## 2019-03-08 DIAGNOSIS — Z881 Allergy status to other antibiotic agents status: Secondary | ICD-10-CM

## 2019-03-08 DIAGNOSIS — F411 Generalized anxiety disorder: Secondary | ICD-10-CM | POA: Diagnosis present

## 2019-03-08 DIAGNOSIS — G8929 Other chronic pain: Secondary | ICD-10-CM | POA: Diagnosis present

## 2019-03-08 DIAGNOSIS — Z9049 Acquired absence of other specified parts of digestive tract: Secondary | ICD-10-CM

## 2019-03-08 DIAGNOSIS — F17210 Nicotine dependence, cigarettes, uncomplicated: Secondary | ICD-10-CM | POA: Diagnosis present

## 2019-03-08 DIAGNOSIS — E282 Polycystic ovarian syndrome: Secondary | ICD-10-CM | POA: Diagnosis present

## 2019-03-08 DIAGNOSIS — K56609 Unspecified intestinal obstruction, unspecified as to partial versus complete obstruction: Principal | ICD-10-CM | POA: Diagnosis present

## 2019-03-08 DIAGNOSIS — Z833 Family history of diabetes mellitus: Secondary | ICD-10-CM

## 2019-03-08 DIAGNOSIS — K5090 Crohn's disease, unspecified, without complications: Secondary | ICD-10-CM | POA: Diagnosis present

## 2019-03-08 DIAGNOSIS — Z8249 Family history of ischemic heart disease and other diseases of the circulatory system: Secondary | ICD-10-CM

## 2019-03-08 DIAGNOSIS — F3177 Bipolar disorder, in partial remission, most recent episode mixed: Secondary | ICD-10-CM | POA: Diagnosis present

## 2019-03-08 DIAGNOSIS — Z79899 Other long term (current) drug therapy: Secondary | ICD-10-CM

## 2019-03-08 DIAGNOSIS — B182 Chronic viral hepatitis C: Secondary | ICD-10-CM | POA: Diagnosis present

## 2019-03-08 DIAGNOSIS — Z8349 Family history of other endocrine, nutritional and metabolic diseases: Secondary | ICD-10-CM

## 2019-03-08 DIAGNOSIS — Z885 Allergy status to narcotic agent status: Secondary | ICD-10-CM

## 2019-03-08 DIAGNOSIS — G4700 Insomnia, unspecified: Secondary | ICD-10-CM | POA: Diagnosis present

## 2019-03-08 DIAGNOSIS — F319 Bipolar disorder, unspecified: Secondary | ICD-10-CM | POA: Diagnosis present

## 2019-03-08 DIAGNOSIS — M797 Fibromyalgia: Secondary | ICD-10-CM | POA: Diagnosis present

## 2019-03-08 DIAGNOSIS — F419 Anxiety disorder, unspecified: Secondary | ICD-10-CM | POA: Diagnosis present

## 2019-03-08 DIAGNOSIS — F3181 Bipolar II disorder: Secondary | ICD-10-CM | POA: Diagnosis present

## 2019-03-08 LAB — CBC AND DIFFERENTIAL
Basophils %: 0.7 % (ref 0.0–3.0)
Basophils Absolute: 0.1 10*3/uL (ref 0.0–0.3)
Eosinophils %: 2.6 % (ref 0.0–7.0)
Eosinophils Absolute: 0.2 10*3/uL (ref 0.0–0.8)
Hematocrit: 36.8 % (ref 36.0–48.0)
Hemoglobin: 12.7 gm/dL (ref 12.0–16.0)
Lymphocytes Absolute: 3.1 10*3/uL (ref 0.6–5.1)
Lymphocytes: 33.8 % (ref 15.0–46.0)
MCH: 35 pg (ref 28–35)
MCHC: 34 gm/dL (ref 32–36)
MCV: 101 fL — ABNORMAL HIGH (ref 80–100)
MPV: 8.8 fL (ref 6.0–10.0)
Monocytes Absolute: 0.9 10*3/uL (ref 0.1–1.7)
Monocytes: 10.1 % (ref 3.0–15.0)
Neutrophils %: 52.8 % (ref 42.0–78.0)
Neutrophils Absolute: 4.8 10*3/uL (ref 1.7–8.6)
PLT CT: 225 10*3/uL (ref 130–440)
RBC: 3.66 10*6/uL — ABNORMAL LOW (ref 3.80–5.00)
RDW: 12.6 % (ref 11.0–14.0)
WBC: 9.1 10*3/uL (ref 4.0–11.0)

## 2019-03-08 LAB — ECG 12-LEAD
P Wave Axis: -3 deg
P-R Interval: 122 ms
Patient Age: 33 years
Q-T Interval(Corrected): 417 ms
Q-T Interval: 392 ms
QRS Axis: 77 deg
QRS Duration: 89 ms
T Axis: 47 years
Ventricular Rate: 68 //min

## 2019-03-08 LAB — BASIC METABOLIC PANEL
Anion Gap: 8.6 mMol/L (ref 7.0–18.0)
BUN / Creatinine Ratio: 8.5 Ratio — ABNORMAL LOW (ref 10.0–30.0)
BUN: 6 mg/dL — ABNORMAL LOW (ref 7–22)
CO2: 22 mMol/L (ref 20–30)
Calcium: 7.9 mg/dL — ABNORMAL LOW (ref 8.5–10.5)
Chloride: 114 mMol/L — ABNORMAL HIGH (ref 98–110)
Creatinine: 0.71 mg/dL (ref 0.60–1.20)
EGFR: 113 mL/min/{1.73_m2} (ref 60–150)
Glucose: 95 mg/dL (ref 71–99)
Osmolality Calculated: 279 mOsm/kg (ref 275–300)
Potassium: 3.6 mMol/L (ref 3.5–5.3)
Sodium: 141 mMol/L (ref 136–147)

## 2019-03-08 LAB — MAGNESIUM: Magnesium: 1.6 mg/dL (ref 1.6–2.6)

## 2019-03-08 LAB — LACTIC ACID, PLASMA: Lactic Acid: 0.6 mMol/L (ref 0.5–1.9)

## 2019-03-08 MED ORDER — CLONAZEPAM 0.5 MG PO TABS
0.5000 mg | ORAL_TABLET | Freq: Two times a day (BID) | ORAL | Status: DC
Start: 2019-03-08 — End: 2019-03-10
  Administered 2019-03-08 – 2019-03-10 (×5): 0.5 mg via ORAL
  Filled 2019-03-08 (×5): qty 1

## 2019-03-08 MED ORDER — ACETAMINOPHEN 160 MG/5ML PO SOLN
1000.00 mg | Freq: Four times a day (QID) | ORAL | Status: DC
Start: 2019-03-08 — End: 2019-03-10

## 2019-03-08 MED ORDER — ACETAMINOPHEN 500 MG PO TABS
1000.0000 mg | ORAL_TABLET | Freq: Four times a day (QID) | ORAL | Status: DC
Start: 2019-03-08 — End: 2019-03-10
  Administered 2019-03-08 – 2019-03-10 (×9): 1000 mg via ORAL
  Filled 2019-03-08 (×10): qty 2

## 2019-03-08 MED ORDER — TRAZODONE HCL 50 MG PO TABS
150.0000 mg | ORAL_TABLET | Freq: Every evening | ORAL | Status: DC
Start: 2019-03-08 — End: 2019-03-10
  Administered 2019-03-08 – 2019-03-09 (×2): 150 mg via ORAL
  Filled 2019-03-08 (×2): qty 1

## 2019-03-08 MED ORDER — ALBUTEROL SULFATE (2.5 MG/3ML) 0.083% IN NEBU
2.50 mg | INHALATION_SOLUTION | Freq: Four times a day (QID) | RESPIRATORY_TRACT | Status: DC | PRN
Start: 2019-03-08 — End: 2019-03-10

## 2019-03-08 MED ORDER — FLUTICASONE FUROATE-VILANTEROL 100-25 MCG/INH IN AEPB
1.00 | INHALATION_SPRAY | Freq: Every morning | RESPIRATORY_TRACT | Status: DC
Start: 2019-03-08 — End: 2019-03-10
  Administered 2019-03-10: 1 via RESPIRATORY_TRACT
  Filled 2019-03-08: qty 14

## 2019-03-08 MED ORDER — ONDANSETRON 4 MG PO TBDP
4.00 mg | ORAL_TABLET | Freq: Three times a day (TID) | ORAL | Status: DC | PRN
Start: 2019-03-08 — End: 2019-03-10
  Administered 2019-03-09: 09:00:00 4 mg via ORAL
  Filled 2019-03-08: qty 1

## 2019-03-08 MED ORDER — DIPHENHYDRAMINE HCL 50 MG/ML IJ SOLN
INTRAMUSCULAR | Status: AC
Start: 2019-03-08 — End: ?
  Filled 2019-03-08: qty 1

## 2019-03-08 MED ORDER — SODIUM CHLORIDE (PF) 0.9 % IJ SOLN
3.00 mL | Freq: Three times a day (TID) | INTRAMUSCULAR | Status: DC
Start: 2019-03-08 — End: 2019-03-10
  Administered 2019-03-08 – 2019-03-10 (×8): 3 mL via INTRAVENOUS

## 2019-03-08 MED ORDER — VH HEPARIN SODIUM (PORCINE) 5000 UNIT/ML IJ SOLN
5000.00 [IU] | Freq: Three times a day (TID) | INTRAMUSCULAR | Status: DC
Start: 2019-03-08 — End: 2019-03-10
  Administered 2019-03-08: 05:00:00 5000 [IU] via SUBCUTANEOUS
  Filled 2019-03-08 (×2): qty 1

## 2019-03-08 MED ORDER — SODIUM CHLORIDE 0.9 % IV BOLUS
1000.00 mL | Freq: Once | INTRAVENOUS | Status: AC
Start: 2019-03-08 — End: 2019-03-08
  Administered 2019-03-08: 02:00:00 1000 mL via INTRAVENOUS

## 2019-03-08 MED ORDER — DROPERIDOL 2.5 MG/ML IJ SOLN
INTRAMUSCULAR | Status: AC
Start: 2019-03-08 — End: ?
  Filled 2019-03-08: qty 2

## 2019-03-08 MED ORDER — NALOXONE HCL 0.4 MG/ML IJ SOLN (WRAP)
0.40 mg | INTRAMUSCULAR | Status: DC | PRN
Start: 2019-03-08 — End: 2019-03-10

## 2019-03-08 MED ORDER — VENLAFAXINE HCL ER 37.5 MG PO CP24
37.50 mg | ORAL_CAPSULE | Freq: Every morning | ORAL | Status: DC
Start: 2019-03-08 — End: 2019-03-10
  Administered 2019-03-08 – 2019-03-10 (×3): 37.5 mg via ORAL
  Filled 2019-03-08 (×3): qty 1

## 2019-03-08 MED ORDER — TIZANIDINE HCL 4 MG PO TABS
8.0000 mg | ORAL_TABLET | Freq: Three times a day (TID) | ORAL | Status: DC | PRN
Start: 2019-03-08 — End: 2019-03-10
  Administered 2019-03-09 – 2019-03-10 (×4): 8 mg via ORAL
  Filled 2019-03-08 (×4): qty 2

## 2019-03-08 MED ORDER — DIPHENHYDRAMINE HCL 50 MG/ML IJ SOLN
25.00 mg | Freq: Once | INTRAMUSCULAR | Status: AC
Start: 2019-03-08 — End: 2019-03-08
  Administered 2019-03-08: 02:00:00 25 mg via INTRAVENOUS

## 2019-03-08 MED ORDER — SODIUM CHLORIDE (PF) 0.9 % IJ SOLN
20.00 mg | Freq: Two times a day (BID) | INTRAMUSCULAR | Status: DC
Start: 2019-03-08 — End: 2019-03-10
  Administered 2019-03-08 – 2019-03-10 (×5): 20 mg via INTRAVENOUS
  Filled 2019-03-08 (×7): qty 2

## 2019-03-08 MED ORDER — ONDANSETRON HCL 4 MG/2ML IJ SOLN
4.00 mg | Freq: Three times a day (TID) | INTRAMUSCULAR | Status: DC | PRN
Start: 2019-03-08 — End: 2019-03-10

## 2019-03-08 MED ORDER — KETOROLAC TROMETHAMINE 30 MG/ML IJ SOLN
15.00 mg | Freq: Four times a day (QID) | INTRAMUSCULAR | Status: DC
Start: 2019-03-08 — End: 2019-03-10
  Administered 2019-03-08 – 2019-03-10 (×10): 15 mg via INTRAVENOUS
  Filled 2019-03-08 (×10): qty 1

## 2019-03-08 MED ORDER — VH HYDROMORPHONE HCL PF 1 MG/ML CARPUJECT
0.50 mg | INTRAMUSCULAR | Status: DC | PRN
Start: 2019-03-08 — End: 2019-03-10
  Administered 2019-03-08 – 2019-03-10 (×14): 0.5 mg via INTRAVENOUS
  Filled 2019-03-08 (×15): qty 1

## 2019-03-08 MED ORDER — DROPERIDOL 2.5 MG/ML IJ SOLN
2.50 mg | Freq: Once | INTRAMUSCULAR | Status: AC
Start: 2019-03-08 — End: 2019-03-08
  Administered 2019-03-08: 02:00:00 2.5 mg via INTRAVENOUS

## 2019-03-08 MED ORDER — LACTATED RINGERS IV SOLN
INTRAVENOUS | Status: DC
Start: 2019-03-08 — End: 2019-03-10

## 2019-03-08 MED ORDER — PREGABALIN 75 MG PO CAPS
100.00 mg | ORAL_CAPSULE | Freq: Two times a day (BID) | ORAL | Status: DC
Start: 2019-03-08 — End: 2019-03-10
  Administered 2019-03-08 – 2019-03-10 (×5): 100 mg via ORAL
  Filled 2019-03-08 (×6): qty 1

## 2019-03-08 MED ORDER — VH MAGNESIUM SULFATE 2 G IN 50 ML IV PREMIX
2.00 g | Freq: Once | INTRAVENOUS | Status: AC
Start: 2019-03-08 — End: 2019-03-08
  Administered 2019-03-08: 11:00:00 2 g via INTRAVENOUS
  Filled 2019-03-08: qty 50

## 2019-03-08 NOTE — ED Provider Notes (Signed)
[facility]  EMERGENCY DEPARTMENT  History and Physical Exam     Patient Name: [name], [name]  Encounter Date:  03/07/2019  Attending Physician: Retta Diones, MD  Room:  C13/C13-A  Patient DOB:  13-Feb-1985  Age: 34 y.o. female  MRN:  [id number]  PCP: Kathi Simpers, NP      Diagnosis/Disposition:  MDM:     Final Impression  1. Small bowel obstruction      Disposition  ED Disposition     ED Disposition Condition Date/Time Comment    Admit  Sat Mar 08, 2019  1:26 AM Admission Comment: Dr. [facility]          Follow up  No follow-up provider specified.  Prescriptions  New Prescriptions    No medications on file       ED Summary:    The patient presents to the Emergency Department with abdominal pain. Treatment has been initiated in the ER, but the patient has not had significant improvement in symptoms, appears ill enough and/or has illness/findings/co-morbidities that make admission for IV medications, possible surgical consult, and further management the most appropriate disposition. Differential diagnosis has included but is not limited to appendicitis, gall bladder disease, bowel obstruction, colitis, gastroenteritis, urinary tract obstruction, AAA, pancreatitic and hepatitis.  Diagnostic impression and plan were discussed and agreed upon with the patient and/or family.  Results of lab/radiology tests were reviewed and discussed with the patient and/or family. All questions were answered and concerns addressed.  Appropriate consultation was made for admission and further treatment of this patient.    Pain improved with droperidol given in the emergency department    I discussed the case with Dr. Emilio Math who graciously came to evaluate the patient    Dr. Emilio Math recommend patient receive NG tube.  Patient is refusing at this time.  Patient admitted for further management.       The patient's past medical records, including those in Care Everywhere when necessary, were reviewed by  me    The results of diagnostic studies have been reviewed by myself. Available past medical, family, social, and surgical histories have been reviewed by myself. The clinical impression and plan have been discussed with the patient and/or the patient's family. All questions have been answered.        History of Presenting Illness:     Nursing Triage note: pt arrived ems as transfer from [facility] for small bowel obstruction    Chief complaint: Abdominal Pain    HPI/ROS is limited by: none  HPI/ROS given by: patient    [name] is a 34 y.o. female presenting with 4-day history of progressive worsening abdominal pain associate with diarrhea.  Patient has a history of Crohn's disease.  She has had multiple abdominal surgeries including a large bowel resection.  The patient states she has had multiple abdominal surgeries for lysis of adhesions.  Patient does have a history of bipolar disease as well as fibromyalgia.  Patient states she has been having severe abdominal pain.  It does not radiate.  Denies having fevers.  Does not know of any alleviating factors.  Has had nausea and vomiting.  Denies chest pain or shortness of breath.  Patient presented to outside hospital and had a CAT scan which revealed evidence of possible small bowel obstruction.  She was transferred to [facility] for further evaluation.  She states the pain medication given at outside hospital has run out and she is still having 8 out of 10  abdominal pain.        Review of Systems:  Physical Exam:     Review of Systems   Constitutional: Negative for chills and fever.   HENT: Negative for congestion and sore throat.    Eyes: Negative for blurred vision.   Respiratory: Negative for cough and shortness of breath.    Cardiovascular: Negative for chest pain.   Gastrointestinal: Positive for abdominal pain, diarrhea, nausea and vomiting.   Genitourinary: Negative for dysuria.   Musculoskeletal: Negative for back pain.   Skin: Negative for  rash.   Neurological: Negative for dizziness, weakness and headaches.   Psychiatric/Behavioral: The patient is nervous/anxious.        Blood pressure 103/59, pulse 72, temperature 97.8 F (36.6 C), temperature source Oral, height 1.676 m, weight 54 kg, SpO2 98 %.     Physical Exam   Constitutional: She is oriented to person, place, and time. She appears well-developed.   HENT:   Head: Normocephalic and atraumatic.   Oral mucosa dry   Eyes: Pupils are equal, round, and reactive to light. EOM are normal.   Neck: Normal range of motion.   Cardiovascular: Normal rate and regular rhythm.   Pulmonary/Chest: Effort normal.   Abdominal: Soft. She exhibits distension. There is abdominal tenderness. There is no guarding.   Hypoactive bowel sounds   Musculoskeletal: Normal range of motion.         General: No edema.   Neurological: She is alert and oriented to person, place, and time.   Skin: Skin is warm and dry.   Psychiatric: She has a normal mood and affect. Her behavior is normal. Judgment and thought content normal.   Nursing note and vitals reviewed.        Diagnostic Results:     LAB STUDIES    All lab value have been personally reviewed by me    Results     ** No results found for the last 24 hours. **          RADIOLOGIC STUDIES    All images have been personally viewed by me    Ct Abdomen Pelvis With Iv Cont    Result Date: 03/07/2019  Multifocal areas of anastomosis identified in the abdomen involving the small bowel and sigmoid colon. There is focal fluid filled distention of right upper quadrant small bowel loops measuring up to 4 cm, with tapering of fluid-filled small bowel loops to normal caliber in the region of the left lower quadrant where anastomosis sutures are also identified. This could represent a transition point. ReadingStation:[id number]        EKG:     EKG:   Last EKG Result     Procedure Component Value Units Date/Time    ECG 12 lead [[id number]] Collected: 03/08/19 0139     Updated: 03/08/19  0143     Patient Age 8 years      Patient DOB 12/29/1985     Patient Height --     Patient Weight --     Interpretation Text --     Sinus rhythm  Compared to ECG 12/07/2018 20:50:22  Sinus bradycardia no longer present  Short PR interval no longer present       Physician Interpreter --     Ventricular Rate 68 //min      QRS Duration 89 ms      P-R Interval 122 ms      Q-T Interval 392 ms      Q-T Interval(Corrected) 417  ms      P Wave Axis -3 deg      QRS Axis 77 deg      T Axis 47 years            PROCEDURES          ORDERS PLACED THIS VISIT     Orders  Orders Placed This Encounter   Procedures    Diet NPO time specified - Other; Sips with Meds allowed    RD initiate protocol if applicable    Full Code    Consult for SBIRT    ECG 12 lead    Bed Request    Admit to Inpatient    ED Admission Request       Medications  Medications   sodium chloride 0.9 % bolus 1,000 mL (1,000 mLs Intravenous New Bag 03/08/19 0143)   droperidol (INAPSINE) injection 2.5 mg (2.5 mg Intravenous Given 03/08/19 0143)   diphenhydrAMINE (BENADRYL) injection 25 mg (25 mg Intravenous Given 03/08/19 0143)   droperidol (INAPSINE) 2.5 MG/ML injection (has no administration in time range)   diphenhydrAMINE (BENADRYL) 50 MG/ML injection (has no administration in time range)              Allergies & Medications:     Pt is allergic to fentanyl; reglan [metoclopramide]; rocephin [ceftriaxone]; tramadol; and vancomycin.    Current/Home Medications    ALBUTEROL SULFATE HFA (PROVENTIL) 108 (90 BASE) MCG/ACT INHALER    Inhale 2 puffs into the lungs every 4 (four) hours as needed for Wheezing or Shortness of Breath    CLONAZEPAM (KLONOPIN) 0.5 MG TABLET    Take 1 tablet (0.5 mg total) by mouth 2 (two) times daily This is to be taken over by psychiatry on Nov 25, 2018    DIPHENOXYLATE-ATROPINE (LOMOTIL) 2.5-0.025 MG PER TABLET    Take 2 tablets by mouth 4 (four) times daily    FLUTICASONE-SALMETEROL (ADVAIR HFA) 230-21 MCG/ACT INHALER    Inhale 2 puffs  into the lungs 2 (two) times daily    IVERMECTIN (STROMECTOL) 3 MG TAB    Take 3.5 tablets (10,500 mcg total) by mouth once a week for 2 doses    ONDANSETRON (ZOFRAN ODT) 4 MG DISINTEGRATING TABLET    Take 1 tablet (4 mg total) by mouth every 8 (eight) hours as needed for Nausea    PREGABALIN (LYRICA) 100 MG CAPSULE    Take 1 capsule (100 mg total) by mouth 2 (two) times daily    TIZANIDINE (ZANAFLEX) 4 MG TABLET    Take 2 tablets (8 mg total) by mouth every 8 (eight) hours as needed (fibromyalgia pain)    TRAZODONE (DESYREL) 150 MG TABLET    Take 1-2 tabs PO PRN at HS.    VENLAFAXINE (EFFEXOR-XR) 37.5 MG 24 HR CAPSULE    Take 37.5 mg by mouth           Past History:     Medical:   Past Medical History:   Diagnosis Date    Anxiety     Bipolar 1 disorder     Chronic abdominal pain     Chronic obstructive pulmonary disease     Fibromyalgia     Gastroesophageal reflux disease     Insomnia     Seasonal allergic rhinitis        Surgical:   Past Surgical History:   Procedure Laterality Date    APPENDECTOMY      CHOLECYSTECTOMY      COLON RESECTION  COLON SURGERY      LAPAROTOMY, ABDOMINAL EXPLORATION      The patient has had multiple prior abdominal explorations, including colon resection, ileostomy's, and surgery for reported Crohn's disease.         Family:   Family History   Problem Relation Age of Onset    Hypertension Mother     Diabetes Mother     Leukemia Father     Liver disease Maternal Grandfather     Hyperlipidemia Paternal Grandfather        Social:  reports that she has been smoking cigarettes. She has been smoking about 1.00 pack per day. She has never used smokeless tobacco. She reports previous drug use. She reports that she does not drink alcohol.        ATTESTATIONS     Retta Diones ,MD    The results of diagnostic studies have been reviewed by myself. The above past medical, family, social, and surgical histories have been reviewed by myself. The clinical impression and plan have  been discussed with the patient and/or the patient's family. All questions have been answered.    Note:  This chart was generated by an EMR and may contain errors, including typographical, or omissions not intended by the user. This chart was generated by the Epic EMR system/speech recognition and may contain inherent errors or omissions not intended by the user. Grammatical errors, random word insertions, deletions, pronoun errors and incomplete sentences are occasional consequences of this technology due to software limitations. Not all errors are caught or corrected. If there are questions or concerns about the content of this note or information contained within the body of this dictation they should be addressed directly with the author for clarification          Ermalene Postin, MD  03/08/19 [phone]

## 2019-03-08 NOTE — ED Notes (Signed)
Dr. Emilio Math at bedside for surgical assesment

## 2019-03-08 NOTE — Progress Notes (Addendum)
NURSE NOTE SUMMARY  [facility] - SURG TELEMETRY STEP-DOWN   Patient Name: Vandam,Lamoine MARIE   Attending Physician: Tyson Alias, MD   Today's date:   03/08/2019 LOS: 0 days   Shift Summary:                                                              0230  Pt arrived to room 321 from ED; VS WNL; IVF infusing @ correct rate; continuous O2 monitor @ bedside; pt drowsy but arousable, falling asleep during conversation; pt states that her abdominal pain is about 7/10; pt aware of NPO status; pt oriented to call bell system & instructed to call RN is she needs to get OOB; patient stable at this time; will continue to monitor.    0300  Clarified with admitting MD that he does not want tele ordered for this patient.    0600  Pt stable overnight.  Refusing weight.   Provider Notifications:        Rapid Response Notifications:  Mobility:          Weight tracking:  Family Dynamic:   Last 3 Weights for the past 72 hrs (Last 3 readings):   Weight   03/08/19 0051 54 kg (119 lb 0.8 oz)             Last Bowel Movement   Last BM Date: 03/07/19

## 2019-03-08 NOTE — ED Triage Notes (Signed)
Pt arrived as a transfer from [facility] for a small bowel obstruction. Pt confirms a hx of bowel obstructions, her last being in November. Pt c/o Left sided abdominal pain radiating to her back. Pt presented to [facility] with n/v/ and pt received 4 zofran en route to [facility].     Pt is currently A&O, calm and cooperative. Pt states her pain is 7/10 at present.
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
## 2019-03-08 NOTE — H&P (Addendum)
HISTORY AND PHYSICAL - ACCESS   Name:  [name], [name]                 MR#:  [id number]               Date:  03/08/19     IMPRESSION:   34 y.o. female  with Small bowel obstruction.  The patient has had symptoms for a week and a half, abdominal pain nausea and vomiting, her abdominal exam is benign, CT scan showing evidence of possible bowel obstruction, no clear transition point, patient has very extensive past surgical history.  We will plan to admit to the surgical service, n.p.o. IV fluid, I offered the patient an NG tube and she wanted to try to avoid the NG tube, she understands that if she has any vomiting episode she would need NG tube.    Additional Diagnoses being addressed this admission also include:  Active Hospital Problems    Diagnosis    Small bowel obstruction    Bipolar 2 disorder, major depressive episode    Chronic hepatitis C    Bipolar disorder       PLAN:   Admit to surgery service  N.p.o. IV fluids  NG tube if any vomiting recurs  No indication for acute surgical intervention    Arnoldo Hooker, MD      ACCESS [phone] or #[phone]     CC:  Patient complains of abdominal pain     HPI:   34 y.o. female presents with a complaint of abdominal pain that she describes as diffuse mostly in the mid to lower abdomen, crampy in nature, intermittent, moderate to severe, associated with nausea and vomiting, her last episode of vomiting was 24 hours ago, she has no obstipation, has been passing loose bowel movements mostly in her sleep, no blood in the stool no melena.  Patient presented to the emergency department at [facility] and was transferred to our facility after CT scan showed evidence of possible bowel obstruction.  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  She reports her last operation was in 2016.  She also has had cholecystectomy appendectomy and oophorectomy.  The patient was recently admitted to the hospital November 2020 seen by Dr. Laurell Roof and was referred to [facility] to see his specialist, he thinks it is of GI doctor, has not been seen yet  Patient reports that she has been told she has Crohn's disease in the past but that diagnosis was not confirmed      PMH:   She  has a past medical history of Anxiety, Bipolar 1 disorder, Chronic abdominal pain, Chronic obstructive pulmonary disease, Fibromyalgia, Gastroesophageal reflux disease, Insomnia, and Seasonal allergic rhinitis.     PSH:   She  has a past surgical history that includes COLON RESECTION; Colon surgery; Cholecystectomy; Appendectomy; and LAPAROTOMY, ABDOMINAL EXPLORATION.     Social History:   She  reports that she has been smoking cigarettes. She has been smoking about 1.00 pack per day. She has never used smokeless tobacco. She reports previous drug use. She reports that she does not drink alcohol.    Family History:   Her family history includes Diabetes in her  mother; Hyperlipidemia in her paternal grandfather; Hypertension in her mother; Leukemia in her father; Liver disease in her maternal grandfather.       Home Medications:     Current Facility-Administered Medications   Medication Dose Route Frequency Provider Last Rate Last Admin    sodium chloride 0.9 % bolus 1,000 mL  1,000 mL Intravenous Once in ED Ermalene Postin, MD 1,000 mL/hr at 03/08/19 0143 1,000 mL at 03/08/19 0143     Current Outpatient Medications   Medication Sig Dispense Refill    clonazePAM (KlonoPIN) 0.5 MG tablet Take 1 tablet (0.5 mg total) by mouth 2 (two) times daily This is to be taken over by psychiatry on Nov 25, 2018 60 tablet 1    diphenoxylate-atropine (Lomotil) 2.5-0.025 MG per tablet Take 2 tablets by mouth 4 (four) times  daily 240 tablet 2    fluticasone-salmeterol (ADVAIR HFA) 230-21 MCG/ACT inhaler Inhale 2 puffs into the lungs 2 (two) times daily 12 g 2    ivermectin (STROMECTOL) 3 MG Tab Take 3.5 tablets (10,500 mcg total) by mouth once a week for 2 doses 7 tablet 0    ondansetron (Zofran ODT) 4 MG disintegrating tablet Take 1 tablet (4 mg total) by mouth every 8 (eight) hours as needed for Nausea 30 tablet 0    pregabalin (LYRICA) 100 MG capsule Take 1 capsule (100 mg total) by mouth 2 (two) times daily 60 capsule 1    tiZANidine (ZANAFLEX) 4 MG tablet Take 2 tablets (8 mg total) by mouth every 8 (eight) hours as needed (fibromyalgia pain) 30 tablet 0    traZODone (DESYREL) 150 MG tablet Take 1-2 tabs PO PRN at HS. 60 tablet 2    venlafaxine (EFFEXOR-XR) 37.5 MG 24 hr capsule Take 37.5 mg by mouth      albuterol sulfate HFA (PROVENTIL) 108 (90 Base) MCG/ACT inhaler Inhale 2 puffs into the lungs every 4 (four) hours as needed for Wheezing or Shortness of Breath 1 Inhaler 2       Allergies:   She is allergic to fentanyl; reglan [metoclopramide]; rocephin [ceftriaxone]; tramadol; and vancomycin.     ROS(10 of 14):   Constitutional:  Denies fever or chills   Eyes:  Denies change in visual acuity   HENT:  Denies nasal congestion or sore throat   Respiratory:  Denies cough or shortness of breath   Cardiovascular:  Denies chest pain or edema   GI:  Reports abdominal pain, nausea, vomiting, no bloody stools or melena  GU:  Denies dysuria   Musculoskeletal:  Denies back pain or joint pain   Integument:  Denies rash   Neurologic:  Denies headache, focal weakness or sensory changes   Endocrine:  Denies polyuria or polydipsia   Lymphatic:  Denies swollen glands   Psychiatric:  Denies depression or anxiety     Physical Exam(8 of 12):   Blood pressure 103/59, pulse 72, temperature 97.8 F (36.6 C), temperature source Oral, height 1.676 m (5' 5.98"), weight 54 kg (119 lb 0.8 oz), SpO2 98 %.   General: Not in acute distress, alert and  oriented x 3, non toxic  HEENT(2-eye/+): normocephalic, atraumatic, sclera clear and anicteric  Neck(0): supple, no lymphadenopathy, trachea midline  Chest: Clear to ausculation bilaterally, no deformities, no crepitus  Cardiac: Regular rate and rythm  Abdomen: Soft, non distended, healed midline laparotomy incision prior ileostomy site in the right lower quadrant, no palpable masses, no hernia  Extremities: no edema, no cyanosis or clubbing and no  tenderness or deformities noted  Skin: no jaundice, no rashes    Labs:    Lab results have been reviewed as follows:  Results     ** No results found for the last 24 hours. **           Radiology:   Ct Abdomen Pelvis With Iv Cont    Result Date: 03/07/2019  Multifocal areas of anastomosis identified in the abdomen involving the small bowel and sigmoid colon. There is focal fluid filled distention of right upper quadrant small bowel loops measuring up to 4 cm, with tapering of fluid-filled small bowel loops to normal caliber in the region of the left lower quadrant where anastomosis sutures are also identified. This could represent a transition point. ReadingStation:[email or web address]

## 2019-03-08 NOTE — Progress Notes (Signed)
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 Is having BM. Will continue to treat conservatively and observe.     Plan:    -NPO   -IVF   -NGT if any vomiting occurs    -holding home lomotil at this time    -resume home meds      Bipolar disorder (01/05/2016)    Assessment: chronic     Plan: home meds     Tamera Punt, MD    X [phone], Pager [phone]  [facility] ACCESS Program    Incidental findings:  none  Additional Info:  Questions were answered.    Subjective/Chief Complaint & ROS:   [name], denies n/v, improved pain     24-hour interval history:  Admitted to access     Meds:   acetaminophen, 1,000 mg, Oral, Q6H    Or  acetaminophen, 1,000 mg, Oral, Q6H  clonazePAM, 0.5 mg, Oral, BID  famotidine, 20 mg, Intravenous, Q12H SCH  fluticasone furoate-vilanterol, 1 puff, Inhalation, QAM  heparin (porcine), 5,000 Units, Subcutaneous, Q8H SCH  ketorolac, 15 mg, Intravenous, Q6H  pregabalin, 100 mg, Oral, BID  sodium chloride (PF), 3 mL, Intravenous, Q8H  traZODone, 150 mg, Oral, QHS  venlafaxine, 37.5 mg, Oral, QAM W/BREAKFAST        Infusions:   lactated ringers 125 mL/hr at 03/08/19 4782        DVT Prophylaxis:  heparin SC  Foley:  No          GI Prophylaxis:  Yes      BM last 24 Hours:  Yes   Bowel Regimen:  No    Diet:  Diet NPO time specified - Other; Sips with Meds allowed    I & O's:  Data reviewed   Pain: well controlled  EXAM:   Vitals:  Blood pressure 112/70, pulse 82,  temperature 98.4 F (36.9 C), temperature source Temporal, resp. rate 19, height 1.676 m (5' 5.98"), weight 54 kg (119 lb 0.8 oz), SpO2 96 %.   General:   in no apparent distress, appears comfortable, non-toxic  Pulmonary:   lungs clear to ausculation, unlabored respirations, no rales/rhonchi/wheezes  Cardiac:   regular rate and rhythm  Abdomen:   soft, nontender, non-distended. Large midline abdominal scar well healed, 2 ileostomy takedown scars well healed  Neuro:   alert, no gross focal neurologic deficits, moves all extremities well  Psych:   normal affect, insight good  Extremities:   SCD's in place and  no edema    Lab Data Reviewed:  Yes - normal wbc     Cultures:  N/a      CHEM:   Recent Labs   Lab 03/08/19  0331 03/07/19  2014   Glucose 95 103*   Sodium 141 138   Potassium 3.6 3.4*   Chloride 114* 108   CO2 22 21.0   BUN 6* 8   Creatinine 0.71 0.83   Calcium 7.9* 8.6   Magnesium 1.6  --      CBC:     Recent Labs   Lab 03/08/19  0331 03/07/19  2014   WBC 9.1 14.8*   Hemoglobin 12.7 13.8   Hematocrit 36.8 42.3   PLT CT 225 258   MCV 101* 98     BANDS:      POCT:      LFTs:    Recent Labs   Lab 03/07/19  2014   ALT 12   AST (SGOT) 13   Alkaline Phosphatase 94   Bilirubin, Total 0.5   Albumin 4.0     COAG:      Lactate:    Recent Labs   Lab 03/08/19  1258   Lactic Acid 0.6       Radiology:    Ct Abdomen Pelvis With Iv Cont    Result Date: 03/07/2019  Multifocal areas of anastomosis identified in the abdomen involving the small bowel and sigmoid colon. There is focal fluid filled distention of right upper quadrant small bowel loops measuring up to 4 cm, with tapering of fluid-filled small bowel loops to normal caliber in the region of the left lower quadrant where anastomosis sutures are also identified. This could represent a transition point. ReadingStation:[email or web address]

## 2019-03-08 NOTE — Progress Notes (Signed)
NURSE NOTE SUMMARY  [facility] - SURG TELEMETRY STEP-DOWN   Patient Name: [name],[name]   Attending Physician: Tyson Alias, MD   Today's date:   03/08/2019 LOS: 0 days   Shift Summary:                                                              1915-Assumed patient care at this time. Lactated ringer's solution running at 181mL/hr. Call bell and bedside table within reach. No tele ordered. Patient is resting comfortably and requires no further intervention at this time. Will continue to monitor.        Provider Notifications:        Rapid Response Notifications:  Mobility:          Weight tracking:  Family Dynamic:   Last 3 Weights for the past 72 hrs (Last 3 readings):   Weight   03/08/19 0051 54 kg (119 lb 0.8 oz)             Last Bowel Movement   Last BM Date: 03/07/19

## 2019-03-08 NOTE — ED Notes (Signed)
Bed: C13-A  Expected date: 03/08/19  Expected time: 12:47 AM  Means of arrival: Ambulance (VMT 106)  Comments:  [facility] 47 female

## 2019-03-09 LAB — BASIC METABOLIC PANEL
Anion Gap: 9.5 mMol/L (ref 7.0–18.0)
BUN / Creatinine Ratio: 12.3 Ratio (ref 10.0–30.0)
BUN: 9 mg/dL (ref 7–22)
CO2: 20 mMol/L (ref 20–30)
Calcium: 8 mg/dL — ABNORMAL LOW (ref 8.5–10.5)
Chloride: 115 mMol/L — ABNORMAL HIGH (ref 98–110)
Creatinine: 0.73 mg/dL (ref 0.60–1.20)
EGFR: 109 mL/min/{1.73_m2} (ref 60–150)
Glucose: 95 mg/dL (ref 71–99)
Osmolality Calculated: 280 mOsm/kg (ref 275–300)
Potassium: 3.5 mMol/L (ref 3.5–5.3)
Sodium: 141 mMol/L (ref 136–147)

## 2019-03-09 LAB — MAGNESIUM
Magnesium: 1.8 mg/dL (ref 1.6–2.6)
Magnesium: 1.7 mg/dL (ref 1.6–2.6)

## 2019-03-09 LAB — PHOSPHORUS: Phosphorus: 1.7 mg/dL — ABNORMAL LOW (ref 2.3–4.7)

## 2019-03-09 MED ORDER — POTASSIUM & SODIUM PHOSPHATES 280-160-250 MG PO PACK
1.00 | PACK | Freq: Four times a day (QID) | ORAL | Status: DC
Start: 2019-03-09 — End: 2019-03-10
  Administered 2019-03-09 – 2019-03-10 (×4): 1 via ORAL
  Filled 2019-03-09 (×4): qty 1

## 2019-03-09 NOTE — Progress Notes (Signed)
ATTENDING ATTESTATION    I have seen and examined the patient.    I have reviewed the notes, assessments, and/or procedures performed by Dr. Kimberlee Nearing, surgery resident, I concur with her/his documentation of [name].    Signed:  Gary Fleet. Wonda Olds, MD  ACCESS/Trauma Surgery  Medical/Surgical Critical Care  [facility]       ACCESS DAILY PROGRESS NOTE    Name:  [name]     DOB:  16-Jan-1986   MR#:  [id number]               ROOM: 321/321-A    DATE:  03/09/19      Principal Diagnosis:  Small bowel obstruction    Refer to below for diagnoses being addressed for this encounter    ASSESSMENT & PLAN:                                                               Hospital Day: 2      CONDITION:  stable    Patient Active Hospital Problem List:   Small bowel obstruction (12/05/2018)    Assessment: complicated surgical hx with 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    Subjective/Chief Complaint & ROS:   [name], denies n/v/pain, having diarrhea, has diarrhea at baseline but is having incontinence at this time      24-hour interval history:  [name]      Meds:   acetaminophen, 1,000 mg, Oral, Q6H    Or  acetaminophen, 1,000 mg, Oral, Q6H  clonazePAM, 0.5 mg, Oral, BID  famotidine, 20 mg, Intravenous, Q12H SCH  fluticasone furoate-vilanterol, 1 puff, Inhalation, QAM  heparin (porcine), 5,000 Units, Subcutaneous, Q8H SCH  ketorolac, 15 mg, Intravenous, Q6H  pregabalin, 100 mg, Oral, BID  sodium chloride (PF), 3 mL, Intravenous,  Q8H  traZODone, 150 mg, Oral, QHS  venlafaxine, 37.5 mg, Oral, QAM W/BREAKFAST        Infusions:   lactated ringers 125 mL/hr at 03/09/19 0113        DVT Prophylaxis:  heparin SC  Foley:  No          GI Prophylaxis:  Yes      BM last 24 Hours:  Yes   Bowel Regimen:  No    Diet:  Diet clear liquid    I & O's:  Data reviewed   Pain: well controlled  EXAM:   Vitals:  Blood pressure 111/66, pulse 80, temperature 98.1 F (36.7 C), temperature source Temporal, resp. rate 14, height 1.676 m (5' 5.98"), weight 60.4 kg (133 lb 3.2 oz), SpO2 100 %.   General:  in no apparent distress, appears comfortable, non-toxic  Pulmonary:   lungs clear to ausculation, unlabored respirations, no rales/rhonchi/wheezes  Cardiac:   regular rate and rhythm  Abdomen:   soft, nontender, non-distended. Large midline abdominal scar well healed, 2 ileostomy takedown scars well healed  Neuro:   alert, no gross focal neurologic deficits, moves all extremities well  Psych:   normal affect, insight good  Extremities:   SCD's in place and no edema    Lab Data Reviewed:  Yes - normal wbc     Cultures:  N/a      CHEM:     Recent Labs   Lab 03/09/19  0719 03/08/19  0331 03/07/19  2014   Glucose 95 95 103*   Sodium 141 141 138   Potassium 3.5 3.6 3.4*   Chloride 115* 114* 108   CO2 20 22 21.0   BUN 9 6* 8   Creatinine 0.73 0.71 0.83   Calcium 8.0* 7.9* 8.6   Magnesium 1.8 1.6  --    Phosphorus 1.7*  --   --      CBC:       Recent Labs   Lab 03/08/19  0331 03/07/19  2014   WBC 9.1 14.8*   Hemoglobin 12.7 13.8   Hematocrit 36.8 42.3   PLT CT 225 258   MCV 101* 98     BANDS:      POCT:      LFTs:      Recent Labs   Lab 03/07/19  2014   ALT 12   AST (SGOT) 13   Alkaline Phosphatase 94   Bilirubin, Total 0.5   Albumin 4.0     COAG:      Lactate:    Recent Labs   Lab 03/08/19  1258   Lactic Acid 0.6       Radiology:    No results found.

## 2019-03-09 NOTE — Progress Notes (Addendum)
NURSE NOTE SUMMARY  [facility] - SURG TELEMETRY STEP-DOWN   Patient Name: [name],[name]   Attending Physician: Tyson Alias, MD   Today's date:   03/09/2019 LOS: 1 days   Shift Summary:                                                              1915-Assumed patient care at this time. Lactated ringers running at 164mL/hr. Call bell and bedside table within reach. No tele ordered. Patient is resting comfortably and requires no further intervention at this time. Will continue to monitor.        Provider Notifications:        Rapid Response Notifications:  Mobility:      PMP Activity: Step 6 - Walks in Room (03/08/2019 11:04 PM)     Weight tracking:  Family Dynamic:   Last 3 Weights for the past 72 hrs (Last 3 readings):   Weight   03/09/19 0545 60.4 kg (133 lb 3.2 oz)   03/08/19 0051 54 kg (119 lb 0.8 oz)             Last Bowel Movement   Last BM Date: 03/09/19

## 2019-03-10 MED ORDER — HYDROCODONE-ACETAMINOPHEN 7.5-325 MG PO TABS
1.0000 | ORAL_TABLET | Freq: Four times a day (QID) | ORAL | Status: DC | PRN
Start: 2019-03-10 — End: 2019-03-10
  Administered 2019-03-10: 1 via ORAL
  Filled 2019-03-10: qty 1

## 2019-03-10 MED ORDER — HYDROCODONE-ACETAMINOPHEN 7.5-325 MG PO TABS
1.0000 | ORAL_TABLET | Freq: Four times a day (QID) | ORAL | 0 refills | Status: DC | PRN
Start: 2019-03-10 — End: 2019-03-11

## 2019-03-10 MED ORDER — FAMOTIDINE 20 MG PO TABS
20.0000 mg | ORAL_TABLET | Freq: Two times a day (BID) | ORAL | Status: DC
Start: 2019-03-10 — End: 2019-03-10

## 2019-03-10 MED ORDER — NALOXONE HCL 4 MG/0.1ML NA LIQD
NASAL | 0 refills | Status: DC
Start: 2019-03-10 — End: 2020-06-03

## 2019-03-10 MED ORDER — NITROFURANTOIN MONOHYD MACRO 100 MG PO CAPS
100.00 mg | ORAL_CAPSULE | Freq: Two times a day (BID) | ORAL | 0 refills | Status: DC
Start: 2019-03-10 — End: 2019-03-11

## 2019-03-10 NOTE — Progress Notes (Signed)
Urine positive for E. coli, sensitive to nitrofurantoin.  Spoke with patient and sent prescription to [facility].

## 2019-03-10 NOTE — Progress Notes (Signed)
Pharmacy IV to PO Conversion Note    Previous IV dose: Famotidine 20 mg twice daily  New oral dose: Famotidine 20 mg twice daily    1.  The patient met the following conversion criteria and can receive oral therapy.   · Able to eat or tolerate enteral feeding OR  · Receiving enteral nutrition by the oral, gastric, or nasogastric tube OR  · Receiving other scheduled medications by the oral route    2.  Patient does not have any of the following:   · Inability to swallow, refuses oral medication or is strict NPO  · Malabsorption syndrome  · Unresolved ileus or complete bowel obstruction  · Active GI bleed (pantoprazole and famotidine only)  · Seizures (lorazepam only)  · Myxedema coma (levothyroxine only)  · Organ donation (levothyroxine only)    If you have any questions, please contact the pharmacist at [phone].    [facility] Department

## 2019-03-10 NOTE — Progress Notes (Signed)
Discharge orders written. Pt. Given and verified understanding of medication and discharge education. Pt. has all personal items, discharge paperwork in possession. Pt. Walked from room being aware of policy to be escorted via wheelchair.

## 2019-03-10 NOTE — Discharge Summary (Signed)
DISCHARGE NOTE    Date Time: 03/10/19 8:19 PM  Patient Name: [name],[name]  Attending Physician: No att. providers found    Date of Admission:   03/08/2019    Date of Discharge:   03/10/2019    Reason for Admission:   Small bowel obstruction [K56.609]    Problems:   Lists the present on admission hospital problems  Present on Admission:   Bipolar disorder   Chronic hepatitis C   Bipolar 2 disorder, major depressive episode   Small bowel obstruction    Problem Lists:  Patient Active Problem List   Diagnosis    PCOS (polycystic ovarian syndrome)    Bipolar disorder    Chronic hepatitis C    Crohn's disease    Fibromyalgia    Grand mal status    Human papilloma virus infection    Insomnia    Encounter for long-term (current) use of high-risk medication    Bipolar 2 disorder, major depressive episode    GAD (generalized anxiety disorder)    Bipolar disorder, in partial remission, most recent episode mixed    Moderate episode of recurrent major depressive disorder       Discharge Dx:   SBO due to sequelae of IBD or surgeries    Consultations:   None.    Procedures performed:   None.    Hospital Course:   From HPI -- 34 y.o. female presented with a complaint of abdominal pain that she described as diffuse mostly in the mid to lower abdomen, crampy in nature, intermittent, moderate to severe, associated with nausea and vomiting, her last episode of vomiting was 24 hours PTA, she had no obstipation, was passing loose bowel movements mostly in her sleep, no blood in the stool no melena.  Patient presented to the emergency department at [facility] and was transferred to our facility after CT scan showed evidence of possible bowel obstruction.  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  She reports her last operation was in 2016.  She also has had cholecystectomy appendectomy and oophorectomy.  The patient was recently admitted to the hospital November 2020 seen by Dr. Laurell Roof and was referred to [facility] to see a specialist, she thinks it is of GI doctor, has not been seen yet    She was treated with bowel rest. She began to resolve most of her symptoms and regain bowel function after which she tolerated advancement in her diet.  She did have some degree of residual abdominal discomfort and usual fibromyalgias. She was d/c'd in good condition with recommendations of low-residue diet and usual medication regimen.  She needs to establish a relationship with a gastroenterologist either here or [facility].     Discharge Medications:     Discharge Medication List as of 03/10/2019  2:31 PM      START taking these medications    Details   HYDROcodone-acetaminophen (NORCO) 7.5-325 MG per tablet Take 1 tablet by mouth every 6 (six) hours as needed for Pain, Starting Mon 03/10/2019, Until Mon 03/17/2019, E-Rx      naloxone (NARCAN) 4 MG/0.1ML nasal spray 1 spray intranasally. If pt does not respond or relapses into respiratory depression call 911. Give additional  doses every 2-3 min., E-Rx         CONTINUE these medications which have NOT CHANGED    Details   clonazePAM (KlonoPIN) 0.5 MG tablet Take 1 tablet (0.5 mg total) by mouth 2 (two) times daily This is to be taken over by psychiatry on Nov 25, 2018, Starting Thu 10/17/2018, Until Fri 10/17/2019, Normal      diphenoxylate-atropine (Lomotil) 2.5-0.025 MG per tablet Take 2 tablets by mouth 4 (four) times daily, Starting Tue 03/04/2019, Until Wed 03/03/2020, Normal      fluticasone-salmeterol (ADVAIR HFA) 230-21 MCG/ACT inhaler Inhale 2 puffs into the lungs 2 (two) times daily, Starting Fri 12/13/2018, E-Rx      ivermectin (STROMECTOL) 3 MG Tab Take 3.5  tablets (10,500 mcg total) by mouth once a week for 2 doses, Starting Thu 03/06/2019, Until Fri 03/14/2019, E-Rx      ondansetron (Zofran ODT) 4 MG disintegrating tablet Take 1 tablet (4 mg total) by mouth every 8 (eight) hours as needed for Nausea, Starting Thu 03/06/2019, E-Rx      pregabalin (LYRICA) 100 MG capsule Take 1 capsule (100 mg total) by mouth 2 (two) times daily, Starting Fri 11/15/2018, Until Sat 11/15/2019, Normal      tiZANidine (ZANAFLEX) 4 MG tablet Take 2 tablets (8 mg total) by mouth every 8 (eight) hours as needed (fibromyalgia pain), Starting Wed 02/05/2019, E-Rx      traZODone (DESYREL) 150 MG tablet Take 1-2 tabs PO PRN at HS., E-Rx      venlafaxine (EFFEXOR-XR) 37.5 MG 24 hr capsule Take 150 mg by mouth   , Starting Tue 01/21/2019, Historical Med      albuterol sulfate HFA (PROVENTIL) 108 (90 Base) MCG/ACT inhaler Inhale 2 puffs into the lungs every 4 (four) hours as needed for Wheezing or Shortness of Breath, Starting Fri 12/13/2018, Until Sat 12/13/2019, E-Rx               Discharge Instructions:   See above    Follow-up with gastroenterology in 2 weeks      Signed by: Arther Abbott

## 2019-03-10 NOTE — Telephone Encounter (Signed)
A user error has taken place: encounter opened in error, closed for administrative reasons.

## 2019-03-10 NOTE — Consults (Signed)
Nutrition Therapy  Nutrition Assessment    Patient Information:     Name:[name]   Age: 34 y.o.   Sex: female     MRN: [id number]    Recommendation:     1. Post-op solids, advance diet to low fiber as tolerated.     Nutrition History:     Pt admitted for abd pain, N/V, SBO. Very extensive past surgical history including subtotal colectomy, multiple surgeries for bowel obstructions, ileostomy reversal. Last operation was in 2016. Also history of COPD, fibromylagia, GERD. Pt reports tolerating scrambled eggs this morning. Feels like ordering some lunch. Has previously followed a low-fiber diet and was familiar with main principles. Reviewed low fiber diet, written materials provided along with RD contact number if questions arise.  Pt planning for d/c today.     Nutrition Focus Physical Findings: Normal    Nutrition Risk Level: Moderate    Nutrition Diagnosis:     Predicted Suboptimal Energy Intake related to SBO as evidenced by post-op solid diet.      Monitoring:  Evaluation:    PO/EN/PN intake:  Total energy intake and Amount of food    Labs:  Electrolyte Profile and Glucose, casual    GI Profile:  Bowel Function       Assessment Data:     Admission Dx:  Small bowel obstruction [K56.609]  PMH:  has a past medical history of Anxiety, Bipolar 1 disorder, Chronic abdominal pain, Chronic obstructive pulmonary disease, Fibromyalgia, Gastroesophageal reflux disease, Insomnia, and Seasonal allergic rhinitis.  PSH:  has a past surgical history that includes COLON RESECTION; Colon surgery; Cholecystectomy; Appendectomy; and LAPAROTOMY, ABDOMINAL EXPLORATION.     Height: 1.676 m (5' 5.98")   Weight: 62.2 kg (137 lb 2 oz)   BMI: Body mass index is 22.14 kg/m.     Weight Monitoring Weight   10/22/2018 63.095 kg   11/15/2018 64.774 kg   12/03/2018 63.05 kg   01/02/2019 63.504 kg   03/08/2019 54 kg   03/09/2019 60.419 kg       Pertinent Meds: reviewed, includes [id number]   Pertinent Labs:  Recent Labs   Lab  03/09/19  1434 03/09/19  0719 03/08/19  0331 03/07/19  2014   Sodium  --  141 141 138   Potassium  --  3.5 3.6 3.4*   Chloride  --  115* 114* 108   CO2  --  20 22 21.0   BUN  --  9 6* 8   Creatinine  --  0.73 0.71 0.83   Glucose  --  95 95 103*   Calcium  --  8.0* 7.9* 8.6   Magnesium 1.7 1.8 1.6  --    Phosphorus  --  1.7*  --   --    Osmolality Calculated  --  280 279 274*       Diet Order:  Orders Placed This Encounter   Procedures   . Diet post-op solids        GI symptoms:    diarrhea  Hydration:   . lactated ringers 125 mL/hr at 03/10/19 0741     I/O:  810/200  Skin:  No PI noted    Food Security Issues:  No    Learning Needs:  No education needs at this time               Additional Comments:       Markham Jordan, RD  03/10/2019 11:13 AM

## 2019-03-10 NOTE — UM Notes (Signed)
REQUEST FOR INPATIENT  [name]  DOB: [birth year]   ID # [id number]  ADMIT DATE: 03/08/19  [place] DATE: 03/10/19      [place] [employer]    [facility]  Utilization Intake Form             Date 03/10/2019               AUTHORIZATION #__________________________________     Patient type (circle) (Inpatient)   Outpatient   Observation     Admission Type:  Emergent    Patient Name: [name]    Address: [address]  [place] [address]    Telephone#: [phone]    Medicaid #: [id number]    SS#: [id number]    DOB: [birth year]    Admitting MD: Alejandro Mulling    Admit Date: 03/08/2019 12:48 AM    Hospital:  [facility], [address]  [address]    Diagnosis: SBO    Procedure:      Contact Person:  Wende Mott RN                                 P [phone]                                 F [phone]      Fax completed form to [facility] at [phone]  To contact [employer] Staff for Demographics at [phone]  _____________________________________________________________________        [facility] Utilization Management Review Sheet    Facility :  [facility]    NAME: [name]  MR#: [id number]    CSN#: [id number]    ROOM: 321/321-A AGE: 34 y.o.    ADMIT DATE AND TIME: 03/08/2019 12:48 AM      PATIENT CLASS: INPATIENT  03/08/19 @ 0204 ADMIT INPATIENT   MCG CRITERIA M210    ATTENDING PHYSICIAN: Tyson Alias, MD  PAYOR:Payor: MEDICAID HMO / Plan: Samuella Cota MEDICAID HMO / Product Type: MANAGED MEDICAID /       AUTH #: PENDING    DIAGNOSIS:     ICD-10-CM    1. Small bowel obstruction  K56.609        HISTORY:   Past Medical History:   Diagnosis Date    Anxiety     Bipolar 1 disorder     Chronic abdominal pain     Chronic obstructive pulmonary disease     Fibromyalgia     Gastroesophageal reflux disease     Insomnia     Seasonal allergic rhinitis      TRANSFERRED FROM PMH     Admission Review    HPI:  Transferred from [facility] with SBO. C/O abdominal pain describes as diffuse mostly in the mid to lower abdomen, crampy in nature, intermittent, moderate to severe, associated with nausea and vomiting, her last episode of vomiting was 24 hours ago, she has no obstipation, has been passing loose bowel movements mostly in her sleep, no blood in the stool no melena.  Patient presented to the emergency department at [facility] and was transferred to our facility after CT scan showed evidence of possible bowel obstruction. 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  She reports her last operation was in 2016.  She also has had cholecystectomy appendectomy and oophorectomy.       ABNORMAL LABS: CHLORIDE 114 CALCIUM 7.9     DIAGNOSTIC TESTING:CT ABDOMEN PELVIS W IV CONT- Multifocal areas of anastomosis identified in the abdomen involving the small bowel and sigmoid colon. There is focal fluid filled distention of right upper quadrant small bowel loops measuring up to 4 cm, with tapering of fluid-filled small bowel loops to normal caliber in the region of the left lower quadrant where anastomosis sutures are also identified. This could represent a transition point.    GN:FAOZH pressure 103/59, pulse 72, temperature 97.8 F (36.6 C), temperature source Oral, height 1.676 m (5' 5.98"), weight 54 kg (119 lb 0.8 oz), SpO2 98 %.     ABNORMAL FINDINGS:    ASSESSMENT AND PLAN:  SBO  PLAN: NPO, IVF, - NG TUBE OFFERED BUT PT DECLINED    Admission Orders  VS W PULSE OX Q 4 HRS, NPO, IS 10 X Q HR WA, IVF @ 125, PEPCID 20 IV Q 12, TYLENOL PO Q 6 HRS, HEPARIN SQ Q 8 HRS, TORADOL 15 IV Q 8 HRS, MAG SULFATE  2 GMS IV X 1, TRAZODONE PO Q HS, EFFEXOR PO QD, DILAUDID IV PRN X 1    Day 2 CSR  03/09/19  LABS: CHLORIDE 115 CALCIUM 8.0 PHOS 1.7  VITALS: Vitals:  Blood pressure 111/66, pulse 80, temperature 98.1 F (36.7 C), temperature source Temporal, resp. rate 14, height 1.676 m (5' 5.98"), weight 60.4 kg (133 lb 3.2 oz), SpO2 100 %.   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, pt feels improved today no n/v, is having diarrhea, denise pain, is feeling hungry and wants to try advancing diet     Plan:               -advance to post op solids               -dec IVF               -NGT if any vomiting occurs               -holding home lomotil at this time               -resume home meds  Bipolar disorder (01/05/2016)    Assessment: chronic     Plan: home meds       Day 3 CSR  03/10/19  LABS: NONE  VITALS: BP (!) 139/94    Pulse 63    Temp 98.8 F (37.1 C) (Temporal)    Resp 18    Ht 1.676 m (5' 5.98")    Wt 62.2 kg (137 lb 2 oz)    SpO2 95%    BMI 22.14 kg/m   [facility]    Wende Mott, RN  Utilization Management  Case Management Department    [facility]  [address]  [place], [place] [address]  T [phone]     F [phone]    [email or web address]

## 2019-03-10 NOTE — Discharge Instr - [facility]S [facility] (Signed)
Diet:  Low residue diet x 2 weeks then advance as tolerated to previous diet.    Activity:  As tolerated.    Medications:  Refer to the After Visit Summary for updates and new medications.  Take over-the-counter  Tylenol (aka acetaminophen) and Advil/Motrin (aka ibuprofen) or other NSAIDs as needed for mild to moderate pain or headache.    Wound/Drain Care:  None    For additional questions or concerns after your discharge, you may contact the ACCESS office at [phone] between the hours of 8:00am - 4:30pm.  Any medication refill requests should be called to the office during daytime business hours.  If you have an urgent need that occurs after office hours, call [phone] and ask to speak to the ACCESS physician on call.  No medication refills will be handled outside of business hours.  Emergencies should go to the Emergency Room and cannot be handled over the phone.    ACCESS BOWEL MAINTENANCE GUIDELINES    1. Drink adequate amounts of fluid throughout the day.    2. Add Metamucil or other similar fiber supplement to your diet twice a day.   Gas relief medications such as Gas-X or Gaviscon are acceptable for use.     Probiotic food products, such as yogurt, are acceptable especially if you are or have been on antibiotics.

## 2019-03-10 NOTE — Progress Notes (Signed)
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 Current SBO resolved, pt feels continued improvement and is tolerating post-op solids. Insists she is drinking a lot of fluids as well.  BMs more normal, no extraordinary losses    Plan: pt eager for d/c and is fine for dismissal. Resume home meds.  Low-residue diet.      Bipolar disorder (01/05/2016)    Assessment: chronic     Plan: home meds     Arther Abbott, MD    X [phone]  [facility] ACCESS Program    Incidental findings:  none  Additional Info:  Questions were answered.    Subjective/Chief Complaint & ROS:   [name], denies n/v/pain, more formed BMs      24-hour interval history:  [name]      Meds:       Infusions:       DVT Prophylaxis:  heparin SC  Foley:  No          GI Prophylaxis:  Yes      BM last 24 Hours:  Yes   Bowel Regimen:  No    Diet:  No diet orders on file    I & O's:  Data reviewed   Pain: well controlled  EXAM:   Vitals:  Blood pressure (!) 139/94, pulse 63, temperature 98.8 F (37.1 C), temperature source Temporal, resp. rate 18, height 1.676 m (5' 5.98"), weight 62.2 kg (137 lb 2 oz), SpO2 95 %.   General:   in no apparent distress, appears comfortable, non-toxic  Pulmonary:   lungs clear to ausculation, unlabored respirations, no rales/rhonchi/wheezes  Cardiac:   regular rate and rhythm  Abdomen:   soft, nontender, non-distended. Large midline abdominal scar well healed, 2 ileostomy takedown scars well healed  Neuro:   alert, no gross focal neurologic  deficits, moves all extremities well  Psych:   normal affect, insight good  Extremities:   SCD's in place and no edema    Lab Data Reviewed:  Yes - normal wbc     Cultures:  N/a      CHEM:     Recent Labs   Lab 03/09/19  1434 03/09/19  0719 03/08/19  0331 03/07/19  2014   Glucose  --  95 95 103*   Sodium  --  141 141 138   Potassium  --  3.5 3.6 3.4*   Chloride  --  115* 114* 108   CO2  --  20 22 21.0   BUN  --  9 6* 8   Creatinine  --  0.73 0.71 0.83   Calcium  --  8.0* 7.9* 8.6   Magnesium 1.7 1.8 1.6  --    Phosphorus  --  1.7*  --   --      CBC:       Recent Labs   Lab 03/08/19  0331 03/07/19  2014   WBC 9.1 14.8*   Hemoglobin 12.7 13.8   Hematocrit 36.8 42.3   PLT CT 225 258   MCV 101* 98     BANDS:      POCT:      LFTs:      Recent Labs   Lab 03/07/19  2014   ALT 12   AST (SGOT) 13   Alkaline Phosphatase 94   Bilirubin, Total 0.5   Albumin 4.0     COAG:      Lactate:    Recent Labs   Lab 03/08/19  1258   Lactic Acid 0.6       Radiology:    No results found.

## 2019-03-11 ENCOUNTER — Encounter: Payer: Self-pay | Admitting: Family Medicine

## 2019-03-11 ENCOUNTER — Emergency Department: Payer: Medicaid Other

## 2019-03-11 ENCOUNTER — Inpatient Hospital Stay
Admission: EM | Admit: 2019-03-11 | Discharge: 2019-03-14 | DRG: 247 | Disposition: A | Discharge status: Home / Self Care | Payer: Medicaid Other | Source: Other Acute Inpatient Hospital | Attending: Surgery | Admitting: Surgery

## 2019-03-11 ENCOUNTER — Encounter: Payer: Self-pay | Admitting: Surgery

## 2019-03-11 ENCOUNTER — Emergency Department
Admission: EM | Admit: 2019-03-11 | Discharge: 2019-03-11 | Disposition: A | Discharge status: Other Acute Inpatient Hospital | Payer: Medicaid Other | Attending: Family Medicine | Admitting: Family Medicine

## 2019-03-11 ENCOUNTER — Ambulatory Visit: Payer: Medicaid Other | Attending: Nurse Practitioner | Admitting: Nurse Practitioner

## 2019-03-11 ENCOUNTER — Encounter (RURAL_HEALTH_CENTER): Payer: Self-pay | Admitting: Nurse Practitioner

## 2019-03-11 DIAGNOSIS — R109 Unspecified abdominal pain: Secondary | ICD-10-CM | POA: Insufficient documentation

## 2019-03-11 DIAGNOSIS — K56609 Unspecified intestinal obstruction, unspecified as to partial versus complete obstruction: Secondary | ICD-10-CM | POA: Insufficient documentation

## 2019-03-11 DIAGNOSIS — Z806 Family history of leukemia: Secondary | ICD-10-CM

## 2019-03-11 DIAGNOSIS — B977 Papillomavirus as the cause of diseases classified elsewhere: Secondary | ICD-10-CM | POA: Diagnosis present

## 2019-03-11 DIAGNOSIS — F3181 Bipolar II disorder: Secondary | ICD-10-CM | POA: Diagnosis present

## 2019-03-11 DIAGNOSIS — G43909 Migraine, unspecified, not intractable, without status migrainosus: Secondary | ICD-10-CM | POA: Insufficient documentation

## 2019-03-11 DIAGNOSIS — M797 Fibromyalgia: Secondary | ICD-10-CM | POA: Diagnosis present

## 2019-03-11 DIAGNOSIS — Z8349 Family history of other endocrine, nutritional and metabolic diseases: Secondary | ICD-10-CM

## 2019-03-11 DIAGNOSIS — K219 Gastro-esophageal reflux disease without esophagitis: Secondary | ICD-10-CM | POA: Diagnosis present

## 2019-03-11 DIAGNOSIS — Z9049 Acquired absence of other specified parts of digestive tract: Secondary | ICD-10-CM

## 2019-03-11 DIAGNOSIS — R55 Syncope and collapse: Secondary | ICD-10-CM | POA: Insufficient documentation

## 2019-03-11 DIAGNOSIS — R5383 Other fatigue: Secondary | ICD-10-CM | POA: Insufficient documentation

## 2019-03-11 DIAGNOSIS — R002 Palpitations: Secondary | ICD-10-CM | POA: Insufficient documentation

## 2019-03-11 DIAGNOSIS — G8929 Other chronic pain: Secondary | ICD-10-CM | POA: Diagnosis present

## 2019-03-11 DIAGNOSIS — G4700 Insomnia, unspecified: Secondary | ICD-10-CM | POA: Diagnosis present

## 2019-03-11 DIAGNOSIS — Z8249 Family history of ischemic heart disease and other diseases of the circulatory system: Secondary | ICD-10-CM

## 2019-03-11 DIAGNOSIS — J449 Chronic obstructive pulmonary disease, unspecified: Secondary | ICD-10-CM | POA: Diagnosis present

## 2019-03-11 DIAGNOSIS — J302 Other seasonal allergic rhinitis: Secondary | ICD-10-CM | POA: Diagnosis present

## 2019-03-11 DIAGNOSIS — B182 Chronic viral hepatitis C: Secondary | ICD-10-CM | POA: Diagnosis present

## 2019-03-11 DIAGNOSIS — R112 Nausea with vomiting, unspecified: Secondary | ICD-10-CM | POA: Insufficient documentation

## 2019-03-11 DIAGNOSIS — F419 Anxiety disorder, unspecified: Secondary | ICD-10-CM | POA: Diagnosis present

## 2019-03-11 DIAGNOSIS — Z8379 Family history of other diseases of the digestive system: Secondary | ICD-10-CM

## 2019-03-11 DIAGNOSIS — R569 Unspecified convulsions: Secondary | ICD-10-CM | POA: Insufficient documentation

## 2019-03-11 DIAGNOSIS — Z7951 Long term (current) use of inhaled steroids: Secondary | ICD-10-CM

## 2019-03-11 DIAGNOSIS — Z833 Family history of diabetes mellitus: Secondary | ICD-10-CM

## 2019-03-11 DIAGNOSIS — F17210 Nicotine dependence, cigarettes, uncomplicated: Secondary | ICD-10-CM | POA: Diagnosis present

## 2019-03-11 DIAGNOSIS — K565 Intestinal adhesions [bands], unspecified as to partial versus complete obstruction: Principal | ICD-10-CM | POA: Diagnosis present

## 2019-03-11 LAB — COMPREHENSIVE METABOLIC PANEL
ALT: 20 U/L (ref 0–55)
AST (SGOT): 24 U/L (ref 10–42)
Albumin/Globulin Ratio: 1.4 Ratio (ref 0.80–2.00)
Albumin: 3.5 gm/dL (ref 3.5–5.0)
Alkaline Phosphatase: 87 U/L (ref 40–145)
Anion Gap: 13.4 mMol/L (ref 7.0–18.0)
BUN / Creatinine Ratio: 5.5 Ratio — ABNORMAL LOW (ref 10.0–30.0)
BUN: 4 mg/dL — ABNORMAL LOW (ref 7–22)
Bilirubin, Total: 0.3 mg/dL (ref 0.1–1.2)
CO2: 23 mMol/L (ref 20.0–30.0)
Calcium: 8.1 mg/dL — ABNORMAL LOW (ref 8.5–10.5)
Chloride: 111 mMol/L — ABNORMAL HIGH (ref 98–110)
Creatinine: 0.73 mg/dL (ref 0.60–1.20)
EGFR: 109 mL/min/{1.73_m2} (ref 60–150)
Globulin: 2.5 gm/dL (ref 2.0–4.0)
Glucose: 79 mg/dL (ref 71–99)
Osmolality Calculated: 283 mOsm/kg (ref 275–300)
Potassium: 3.4 mMol/L — ABNORMAL LOW (ref 3.5–5.3)
Protein, Total: 6 gm/dL (ref 6.0–8.3)
Sodium: 144 mMol/L (ref 136–147)

## 2019-03-11 LAB — CBC AND DIFFERENTIAL
Bands: 1 % (ref 0–10)
Basophils %: 1 % (ref 0.0–3.0)
Basophils Absolute: 0.1 10*3/uL (ref 0.0–0.3)
Eosinophils %: 3 % (ref 0.0–7.0)
Eosinophils Absolute: 0.3 10*3/uL (ref 0.0–0.8)
Hematocrit: 43 % (ref 36.0–48.0)
Hemoglobin: 14.5 gm/dL (ref 12.0–16.0)
Lymphocytes Absolute: 2.2 10*3/uL (ref 0.6–5.1)
Lymphocytes: 19 % (ref 15.0–46.0)
MCH: 33 pg (ref 28–35)
MCHC: 34 gm/dL (ref 31–36)
MCV: 98 fL (ref 80–100)
MPV: 8.4 fL (ref 6.0–10.0)
Monocytes Absolute: 1 10*3/uL (ref 0.1–1.7)
Monocytes: 9 % (ref 3.0–15.0)
Neutrophils %: 67 % (ref 42.0–78.0)
Neutrophils Absolute: 7.8 10*3/uL (ref 1.7–8.6)
PLT CT: 252 10*3/uL (ref 130–440)
RBC: 4.41 10*6/uL (ref 3.80–5.00)
RDW: 12.2 % (ref 10.5–14.5)
WBC: 11.4 10*3/uL — ABNORMAL HIGH (ref 4.0–11.0)

## 2019-03-11 LAB — I-STAT LACTIC ACID
Lactic Acid I-Stat: 0.6 mMol/L (ref 0.5–1.9)
Room Number I-Stat: 14

## 2019-03-11 LAB — VH URINALYSIS WITH MICROSCOPIC AND CULTURE IF INDICATED
Bilirubin, UA: NEGATIVE mg/dL
Blood, UA: NEGATIVE mg/dL
Glucose, UA: NEGATIVE mg/dL
Ketones UA: NEGATIVE mg/dL
Leukocyte Esterase, UA: NEGATIVE Leu/uL
Nitrite, UA: POSITIVE — AB
Protein, UR: NEGATIVE mg/dL
RBC, UA: NONE SEEN /hpf (ref ?–23)
Urine Specific Gravity: 1.015 (ref 1.001–1.040)
Urobilinogen, UA: 2 mg/dL — AB
WBC, UA: NONE SEEN /hpf
pH, Urine: 7 pH (ref 5.0–8.0)

## 2019-03-11 LAB — URINE HCG QUALITATIVE: Urine HCG Qualitative: NEGATIVE

## 2019-03-11 LAB — VH I-STAT LACTIC ACID NOTIFICATION

## 2019-03-11 LAB — LIPASE: Lipase: 47 U/L (ref 8–78)

## 2019-03-11 LAB — MAGNESIUM: Magnesium: 1.6 mg/dL (ref 1.6–2.6)

## 2019-03-11 MED ORDER — NALOXONE HCL 0.4 MG/ML IJ SOLN (WRAP)
0.40 mg | INTRAMUSCULAR | Status: DC | PRN
Start: 2019-03-11 — End: 2019-03-14

## 2019-03-11 MED ORDER — VH HYDROMORPHONE HCL PF 1 MG/ML CARPUJECT
0.50 mg | Freq: Once | INTRAMUSCULAR | Status: DC
Start: 2019-03-11 — End: 2019-03-11

## 2019-03-11 MED ORDER — VH HYDROMORPHONE HCL PF 1 MG/ML CARPUJECT
INTRAMUSCULAR | Status: AC
Start: 2019-03-11 — End: ?
  Filled 2019-03-11: qty 1

## 2019-03-11 MED ORDER — ONDANSETRON 4 MG PO TBDP
ORAL_TABLET | ORAL | Status: AC
Start: 2019-03-11 — End: ?
  Filled 2019-03-11: qty 1

## 2019-03-11 MED ORDER — MORPHINE SULFATE 4 MG/ML IJ/IV SOLN (WRAP)
Status: AC
Start: 2019-03-11 — End: ?
  Filled 2019-03-11: qty 1

## 2019-03-11 MED ORDER — VH HYDROMORPHONE HCL PF 1 MG/ML CARPUJECT
1.00 mg | Freq: Once | INTRAMUSCULAR | Status: AC
Start: 2019-03-11 — End: 2019-03-11
  Administered 2019-03-11: 15:00:00 1 mg via INTRAMUSCULAR

## 2019-03-11 MED ORDER — DIPHENHYDRAMINE HCL 50 MG/ML IJ SOLN
INTRAMUSCULAR | Status: AC
Start: 2019-03-11 — End: ?
  Filled 2019-03-11: qty 1

## 2019-03-11 MED ORDER — CLONAZEPAM 0.5 MG PO TABS
ORAL_TABLET | ORAL | Status: AC
Start: 2019-03-11 — End: ?
  Filled 2019-03-11: qty 1

## 2019-03-11 MED ORDER — IOHEXOL 350 MG/ML IV SOLN
100.00 mL | Freq: Once | INTRAVENOUS | Status: AC | PRN
Start: 2019-03-11 — End: 2019-03-11
  Administered 2019-03-11: 100 mL via INTRAVENOUS

## 2019-03-11 MED ORDER — MORPHINE SULFATE 4 MG/ML IJ/IV SOLN (WRAP)
4.0000 mg | Freq: Once | Status: AC
Start: 2019-03-11 — End: 2019-03-11
  Administered 2019-03-11: 22:00:00 4 mg via INTRAVENOUS

## 2019-03-11 MED ORDER — CLONAZEPAM 0.5 MG PO TABS
0.5000 mg | ORAL_TABLET | Freq: Every day | ORAL | Status: DC | PRN
Start: 2019-03-11 — End: 2019-03-12
  Administered 2019-03-11 – 2019-03-12 (×2): 0.5 mg via ORAL
  Filled 2019-03-11: qty 1

## 2019-03-11 MED ORDER — SODIUM CHLORIDE 0.9 % IV BOLUS
1000.00 mL | Freq: Once | INTRAVENOUS | Status: AC
Start: 2019-03-11 — End: 2019-03-11
  Administered 2019-03-11: 20:00:00 1000 mL via INTRAVENOUS

## 2019-03-11 MED ORDER — ONDANSETRON HCL 4 MG/2ML IJ SOLN
4.00 mg | Freq: Three times a day (TID) | INTRAMUSCULAR | Status: DC | PRN
Start: 2019-03-11 — End: 2019-03-14
  Administered 2019-03-12: 17:00:00 4 mg via INTRAVENOUS
  Filled 2019-03-11: qty 2

## 2019-03-11 MED ORDER — ONDANSETRON HCL 4 MG/2ML IJ SOLN
INTRAMUSCULAR | Status: AC
Start: 2019-03-11 — End: ?
  Filled 2019-03-11: qty 2

## 2019-03-11 MED ORDER — MORPHINE SULFATE 4 MG/ML IJ/IV SOLN (WRAP)
4.0000 mg | Freq: Once | Status: AC
Start: 2019-03-11 — End: 2019-03-11
  Administered 2019-03-11: 20:00:00 4 mg via INTRAVENOUS

## 2019-03-11 MED ORDER — VH HYDROMORPHONE HCL PF 1 MG/ML CARPUJECT
1.00 mg | Freq: Once | INTRAMUSCULAR | Status: AC
Start: 2019-03-11 — End: 2019-03-11
  Administered 2019-03-11: 16:00:00 1 mg via INTRAMUSCULAR

## 2019-03-11 MED ORDER — MORPHINE SULFATE 4 MG/ML IJ/IV SOLN (WRAP)
4.0000 mg | Status: DC | PRN
Start: 2019-03-11 — End: 2019-03-12
  Administered 2019-03-11: 4 mg via INTRAVENOUS

## 2019-03-11 MED ORDER — ONDANSETRON 4 MG PO TBDP
4.00 mg | ORAL_TABLET | Freq: Three times a day (TID) | ORAL | Status: DC | PRN
Start: 2019-03-11 — End: 2019-03-14

## 2019-03-11 MED ORDER — ONDANSETRON HCL 4 MG/2ML IJ SOLN
4.00 mg | Freq: Once | INTRAMUSCULAR | Status: AC
Start: 2019-03-11 — End: 2019-03-11
  Administered 2019-03-11: 20:00:00 4 mg via INTRAVENOUS

## 2019-03-11 MED ORDER — ONDANSETRON 4 MG PO TBDP
4.00 mg | ORAL_TABLET | Freq: Once | ORAL | Status: AC
Start: 2019-03-11 — End: 2019-03-11
  Administered 2019-03-11: 15:00:00 4 mg via ORAL

## 2019-03-11 MED ORDER — DIPHENHYDRAMINE HCL 50 MG/ML IJ SOLN
25.00 mg | Freq: Once | INTRAMUSCULAR | Status: AC
Start: 2019-03-11 — End: 2019-03-11
  Administered 2019-03-11: 20:00:00 25 mg via INTRAVENOUS

## 2019-03-11 MED ORDER — ONDANSETRON HCL 4 MG/2ML IJ SOLN
4.00 mg | Freq: Once | INTRAMUSCULAR | Status: DC
Start: 2019-03-11 — End: 2019-03-11

## 2019-03-11 NOTE — ED Notes (Signed)
Report given to [facility] from VMT  Pt being transferred to [facility] ER

## 2019-03-11 NOTE — ED Notes (Addendum)
IV attempted x 3 with & without U/S unsuccessful. Labs obtained during attempt. MD notified.

## 2019-03-11 NOTE — ED Triage Notes (Signed)
Pt to ED2 via w/c. States she was discharged from [facility] yesterday for SBO. "I'm in excruciating pain. I haven't gone to the bathroom in two days." Denies burning or frequency with urination. Pt points to mid abdomen and states pain goes to right side and around to the back. 9/10 pain. Vomited two hours ago. Denies passing gas. Denies fever, cough, SOB. Pt dropped off to ED.

## 2019-03-11 NOTE — ED Notes (Signed)
Pt to [facility] via w/c to provide urine specimen.

## 2019-03-11 NOTE — ED Provider Notes (Signed)
[facility]  EMERGENCY DEPARTMENT  History and Physical Exam       Patient Name: [name], [name]  Encounter Date:  03/11/2019  Physician Assistant: Boykin Peek, PA-C  Attending Physician: Eulogio Ditch*  PCP: Kathi Simpers, NP  Patient DOB:  25-Feb-1985  MRN:  [id number]  Room:  C14/C14-A    History of Presenting Illness     Chief complaint: Abdominal Pain    HPI/ROS given by: Patient    Triage note: Patient presents with SBO from [facility].    Dejon Bette [name] is a 34 y.o. 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  CAT scan OSH is down.  She complains of recurrent/worsening abdominal pain since she was discharged yesterday.  She is nauseated.  She has not had a bowel movement since she left the hospital.     Review of Systems     Review of Systems   Constitutional: Negative for chills and fever.   Respiratory: Negative for shortness of breath.    Cardiovascular: Negative for chest pain.   Gastrointestinal: Positive for abdominal distention, abdominal pain and nausea.   Neurological: Negative for speech difficulty.   Psychiatric/Behavioral: Negative for agitation.   All other systems reviewed and are negative.       Allergies & Medications     Pt is allergic to fentanyl; reglan [metoclopramide]; rocephin [ceftriaxone]; tramadol; and vancomycin.    Current/Home Medications    ACETAMINOPHEN-CODEINE (TYLENOL #3) 300-30 MG PER TABLET    Take 1 tablet by mouth 3 (three) times daily       ALBUTEROL SULFATE HFA (PROVENTIL) 108 (90 BASE) MCG/ACT INHALER    Inhale 2 puffs into the lungs every 4 (four) hours as needed for Wheezing or Shortness of Breath    CLONAZEPAM (KLONOPIN) 0.5 MG TABLET    Take 0.5 mg by  mouth daily as needed for Anxiety    DIPHENOXYLATE-ATROPINE (LOMOTIL) 2.5-0.025 MG PER TABLET    Take 2 tablets by mouth 4 (four) times daily    FLUTICASONE-SALMETEROL (ADVAIR HFA) 230-21 MCG/ACT INHALER    Inhale 2 puffs into the lungs 2 (two) times daily    NALOXONE (NARCAN) 4 MG/0.1ML NASAL SPRAY    1 spray intranasally. If pt does not respond or relapses into respiratory depression call 911. Give additional doses every 2-3 min.    ONDANSETRON (ZOFRAN ODT) 4 MG DISINTEGRATING TABLET    Take 1 tablet (4 mg total) by mouth every 8 (eight) hours as needed for Nausea    PREGABALIN (LYRICA) 100 MG CAPSULE    Take 1 capsule (100 mg total) by mouth 2 (two) times daily    TIZANIDINE (ZANAFLEX) 4 MG TABLET    Take 2 tablets (8 mg total) by mouth every 8 (eight) hours as needed (fibromyalgia pain)    TOPIRAMATE (TOPAMAX) 25 MG TABLET    Take 25 mg by mouth 2 (two) times daily    TRAZODONE (DESYREL) 300 MG TABLET    Take 300 mg by mouth nightly    VENLAFAXINE (EFFEXOR-XR) 150 MG 24 HR CAPSULE    Take 150 mg by mouth every morning        Past Medical History     Pt has a past medical history of Anxiety,  Bipolar 1 disorder, Chronic abdominal pain, Chronic obstructive pulmonary disease, Fibromyalgia, Gastroesophageal reflux disease, Insomnia, Seasonal allergic rhinitis, Small bowel obstruction, and Small bowel obstruction due to adhesions.     Past Surgical History     Pt  has a past surgical history that includes COLON RESECTION; Colon surgery; Cholecystectomy; Appendectomy; and LAPAROTOMY, ABDOMINAL EXPLORATION.     Family History     The family history includes Diabetes in her mother; Hyperlipidemia in her paternal grandfather; Hypertension in her mother; Leukemia in her father; Liver disease in her maternal grandfather.     Social History     Pt reports that she has been smoking cigarettes. She has been smoking about 1.00 pack per day. She has never used smokeless tobacco. She reports previous drug use. She reports that  she does not drink alcohol.     Physical Exam     Blood pressure 135/79, pulse 97, temperature 98.6 F (37 C), temperature source Temporal, resp. rate 17, height 1.651 m, weight 60.3 kg, last menstrual period 02/24/2019, SpO2 97 %.    Constitutional: Well developed, well nourished, active, in no apparent distress.  HENT:   Head: Normocephalic, atraumatic  Ears: No external lesions.  Nose: No external lesions. No epistaxis or drainage.  Eyes: PERRL. No scleral icterus. No conjunctival injection. EOMI.  Neck: Trachea is midline. No JVD. Normal range of motion. No apparent masses.  Cardiovascular: Regular rhythm, S1 normal and S2 normal. No murmur heard.  Pulmonary/Chest: Effort normal. Lungs clear to auscultation bilaterally.   Abdominal: Soft with mild diffuse tenderness.  No rebound tenderness or guarding.  High-pitched bowel sounds throughout.  Genitourinay/Anorectal: Defferred  Musculoskeletal: Normal range of motion of extremities. No deformity or apparent injury.   Neurological: Pt is alert. Cranial nerves are grossly intact. Moving all extremities without apparent deficit.   Psychiatric: Affect is appropriate. There is no agitation.   Skin: Skin is warm, dry, well perfused. No rash noted.      Diagnostic Results     The results of the diagnostic studies below have been reviewed by myself:    Labs  Results     Procedure Component Value Units Date/Time    Basic Metabolic Panel [[id number]]  (Abnormal) Collected: 03/12/19 0507    Specimen: Plasma Updated: 03/12/19 0542     Sodium 142 mMol/L      Potassium 2.9 mMol/L      Chloride 110 mMol/L      CO2 24 mMol/L      Calcium 8.2 mg/dL      Glucose 94 mg/dL      Creatinine 4.78 mg/dL      BUN 4 mg/dL      Anion Gap 29.5 mMol/L      BUN / Creatinine Ratio 5.5 Ratio      EGFR 109 mL/min/1.11m2      Osmolality Calculated 280 mOsm/kg     CBC and differential [[id number]] Collected: 03/12/19 0507    Specimen: Blood Updated: 03/12/19 0527     WBC 9.7 K/cmm      RBC 4.07  M/cmm      Hemoglobin 13.8 gm/dL      Hematocrit 84.6 %      MCV 100 fL      MCH 34 pg      MCHC 34 gm/dL      RDW 96.2 %      PLT CT 252 K/cmm      MPV 8.9 fL      Neutrophils % 59.6 %  Lymphocytes 27.9 %      Monocytes 9.9 %      Eosinophils % 1.9 %      Basophils % 0.6 %      Neutrophils Absolute 5.8 K/cmm      Lymphocytes Absolute 2.7 K/cmm      Monocytes Absolute 1.0 K/cmm      Eosinophils Absolute 0.2 K/cmm      Basophils Absolute 0.1 K/cmm     i-Stat Lactic AcID [[id number]] Collected: 03/11/19 1910    Specimen: Venipuncture Updated: 03/11/19 1913     Room Number I-Stat 14     Sample I-Stat Venous     Site I-Stat L Radial     Lactic Acid I-Stat 0.6 mMol/L     Lactic Acid I-Stat [[id number]] Collected: 03/11/19 1905    Specimen: ISTAT Updated: 03/11/19 1906     I-STAT Notification Istat Notification          Radiologic Studies  Ct Abdomen Pelvis W Iv/ Wo Po Cont    Result Date: 03/11/2019  1. Limited noncontrast bowel assessment. Stable postoperative configuration with multiple bowel anastomoses. 2. Increased fluid-gas distended distal bowel/colon. However, no definite transition points identifiable. Nonspecific malabsorptive changes also possible. 3. Increased bowel wall thickening and mucosal hyperenhancement in the right abdomen could suggest superimposed mild enterocolitis, though limited.  4. No pneumatosis or pneumoperitoneum. No intra-abdominal abscess evident. 5. New small ascites. Bilateral pleural effusions. Mild anasarca. ReadingStation:[id number]      EKG:      ED Meds     ED Medication Orders (From admission, onward)    Start Ordered     Status Ordering Provider    03/12/19 0800 03/12/19 0038    Every morning     Route: Oral  Ordered Dose: 150 mg     Discontinued MAYUIERS, MATTHEW J    03/12/19 0800 03/12/19 0038  enoxaparin (LOVENOX) syringe 40 mg  Every 24 hours     Route: Subcutaneous  Ordered Dose: 40 mg     Last MAR action: Not Given [employer]    03/12/19 0800 03/12/19 0710   potassium chloride 10 mEq in 100 mL IVPB (premix)  Every 1 hour     Route: Intravenous  Ordered Dose: 10 mEq     Last MAR action: Stopped Marchia Bond, MATTHEW J    03/12/19 0322 03/12/19 0321  morphine injection 3 mg  Every 2 hours PRN     Route: Intravenous  Ordered Dose: 3 mg     Last MAR action: Given MAYUIERS, MATTHEW J    03/12/19 0118 03/12/19 0117  diazePAM (VALIUM) injection 2.5 mg  Once     Route: Intravenous  Ordered Dose: 2.5 mg     Last MAR action: Given MAYUIERS, MATTHEW J    03/12/19 0039 03/12/19 0038  lactated ringers infusion  Continuous     Route: Intravenous     Last MAR action: [employer], MATTHEW J    03/12/19 0038 03/12/19 0038  sodium chloride (PF) 0.9 % injection 3 mL  Every 8 hours     Route: Intravenous  Ordered Dose: 3 mL     Last MAR action: Not Given MAYUIERS, MATTHEW J    03/11/19 2310 03/11/19 2310  naloxone (NARCAN) injection 0.4 mg  As needed     Route: Intravenous  Ordered Dose: 0.4 mg     Acknowledged MAYUIERS, MATTHEW J    03/11/19 2310 03/11/19 2310  clonazePAM (KlonoPIN) tablet 0.5 mg  Daily PRN  Route: Oral  Ordered Dose: 0.5 mg     Last MAR action: Given MAYUIERS, MATTHEW J    03/11/19 2310 03/11/19 2310  ondansetron (ZOFRAN-ODT) disintegrating tablet 4 mg  Every 8 hours PRN     Route: Oral  Ordered Dose: 4 mg     Acknowledged MAYUIERS, MATTHEW J    03/11/19 2310 03/11/19 2310  ondansetron (ZOFRAN) injection 4 mg  Every 8 hours PRN     Route: Intravenous  Ordered Dose: 4 mg     Acknowledged MAYUIERS, MATTHEW J    03/11/19 2310 03/11/19 2310    Every 4 hours PRN     Route: Intravenous  Ordered Dose: 4 mg     Discontinued MAYUIERS, MATTHEW J    03/11/19 2147 03/11/19 2146  morphine injection 4 mg  Once in ED     Route: Intravenous  Ordered Dose: 4 mg     Last MAR action: Given Boykin Peek    03/11/19 2012 03/11/19 2011  diphenhydrAMINE (BENADRYL) injection 25 mg  Once in ED     Route: Intravenous  Ordered Dose: 25 mg     Last MAR action: Given Boykin Peek    03/11/19  1926 03/11/19 1926  iohexol (OMNIPAQUE) 350 MG/ML injection 100 mL  IMG once as needed     Route: Intravenous  Ordered Dose: 100 mL     Last MAR action: Imaging Agent Given Jimmy Footman [facility]    03/11/19 1919 03/11/19 1918  morphine injection 4 mg  Once in ED     Route: Intravenous  Ordered Dose: 4 mg     Last MAR action: Given SHAW, CHRISTOPHER R    03/11/19 1919 03/11/19 1918  ondansetron (ZOFRAN) injection 4 mg  Once in ED     Route: Intravenous  Ordered Dose: 4 mg     Last MAR action: Given SHAW, CHRISTOPHER R    03/11/19 1802 03/11/19 1801  sodium chloride 0.9 % bolus 1,000 mL  Once in ED     Route: Intravenous  Ordered Dose: 1,000 mL     Last MAR action: Stopped Boykin Peek           ED Course and Medical Decision Making     Old medical records and nursing/triage notes were reviewed by myself    DIAGNOSTIC CONSIDERATIONS    SBO, ileus, functional pain    CONSULTS    Dr. Laural Benes (surgery)-discussed case.  Confirms that pt does need CT scan.  Will consult for admission.    ED COURSE & MDM    ED Course as of Mar 11 1533   Tue Mar 11, 2019   1918 Seen and evaluated by me for complaints of abdominal pain.      Patient is a pleasant 34 year old woman with a history of Crohn's disease, complicated by small bowel obstructions in the past has had multiple abdominal surgeries.  She was recently admitted to the hospital for yet another small bowel obstruction observed on CT of the abdomen and pelvis dated March 07, 2019 as follows:IMPRESSION:   Multifocal areas of anastomosis identified in the abdomen involving the small bowel and sigmoid colon. There is focal fluid filled distention of right upper quadrant small bowel loops measuring up to 4 cm, with tapering of fluid-filled small bowel loops   to normal caliber in the region of the left lower quadrant where anastomosis sutures are also identified. This could represent a transition point.    ... She was subsequently discharged from the  hospital yesterday  after completion treatment which fortunately for the patient did not require any surgeries.Patient lives in [place], [place] and developed worsening abdominal pain today.  She subsequently went to [facility] where plain films of the abdomen pelvis were performed and read as follows:IMPRESSION:   Gaseous distention of small bowel loops measuring up to 6.9 cm, slightly worsened since the recent CT study of the abdomen. Findings are likely related to worsening small bowel obstruction.    ... And given her recent admission and subsequent discharge from the hospital here, coupled with the fact that their CT scanner was down, the patient was subsequently transferred to the ER at [facility] for further evaluation and management to include, but not be limited to surgical consultation.At the time of my evaluation, patient's pain level is 9/10.  Her repeat CT abdomen pelvis is pending.  Pain meds have been ordered.  Patient is being admitted to the general surgery service.   BP: 122/74 [CS]      ED Course User Index  [CS] Skip Estimable, MD       In addition to the above history, please see nursing notes. Allergies, meds, past medical, family, social hx, and the results of the diagnostic studies performed have been reviewed by myself.    This chart was generated by an EMR and may contain errors or omissions not intended by the user.     Procedures / Critical Care     None     Diagnosis / Disposition     Clinical Impression  1. SBO (small bowel obstruction)        Disposition  ED Disposition     ED Disposition Condition Date/Time Comment    Admit  Tue Mar 11, 2019  6:01 PM           Follow up for Discharged Patients  No follow-up provider specified.    Prescriptions for Discharged Patients  New Prescriptions    No medications on file               Boykin Peek, [place]  03/12/19 1536       Skip Estimable, MD  03/14/19 1039

## 2019-03-11 NOTE — ED Provider Notes (Signed)
History     Chief Complaint   Patient presents with    Abdominal Pain     HPI   This patient with a complicated h/o Crohn Disease with multiple surgeries was recently admitted to [facility] from 2/13 - 2/15 for SBO managed conservatively. The patient reports worsening pain and she complete obstipation since her discharge. She has had one episode of emesis. Denies fever. No other complaints.      Past Medical History:   Diagnosis Date    Anxiety     Bipolar 1 disorder     Chronic abdominal pain     Chronic obstructive pulmonary disease     Fibromyalgia     Gastroesophageal reflux disease     Insomnia     Seasonal allergic rhinitis        Past Surgical History:   Procedure Laterality Date    APPENDECTOMY      CHOLECYSTECTOMY      COLON RESECTION      COLON SURGERY      LAPAROTOMY, ABDOMINAL EXPLORATION      The patient has had multiple prior abdominal explorations, including colon resection, ileostomy's, and surgery for reported Crohn's disease.         Family History   Problem Relation Age of Onset    Hypertension Mother     Diabetes Mother     Leukemia Father     Liver disease Maternal Grandfather     Hyperlipidemia Paternal Grandfather        Social  Social History     Tobacco Use    Smoking status: Current Every Day Smoker     Packs/day: 1.00     Types: Cigarettes    Smokeless tobacco: Never Used   Substance Use Topics    Alcohol use: Never     Frequency: Never    Drug use: Not Currently     Comment: opiates -- last use 2013       .     Allergies   Allergen Reactions    Fentanyl Anaphylaxis    Reglan [Metoclopramide]     Rocephin [Ceftriaxone]     Tramadol     Vancomycin        Home Medications             acetaminophen-codeine (TYLENOL #3) 300-30 MG per tablet     TAKE 1 TABLET BY MOUTH THREE TIMES DAILY FOR 28 DAYS     albuterol sulfate HFA (PROVENTIL) 108 (90 Base) MCG/ACT inhaler     Inhale 2 puffs into the lungs every 4 (four) hours as needed for Wheezing or Shortness of Breath      Butalbital-APAP-Caffeine 50-300-40 MG Cap     butalbital-acetaminophen-caffeine 50 mg-300 mg-40 mg capsule   TK 1 TO 2 CS PO Q 4 TO 6 H PRN FOR HEADACHE     ciprofloxacin (CIPRO) 500 MG tablet     ciprofloxacin 500 mg tablet   TK 1 T PO Q 12 H FOR 7 DAYS     clonazePAM (KlonoPIN) 0.5 MG tablet     Take 1 tablet (0.5 mg total) by mouth 2 (two) times daily This is to be taken over by psychiatry on Nov 25, 2018     diphenoxylate-atropine (Lomotil) 2.5-0.025 MG per tablet     Take 2 tablets by mouth 4 (four) times daily     fluconazole (DIFLUCAN) 200 MG tablet     fluconazole 200 mg tablet   TK 1 T PO D  FOR 14 DAYS     fluticasone-salmeterol (ADVAIR HFA) 230-21 MCG/ACT inhaler     Inhale 2 puffs into the lungs 2 (two) times daily     naloxone (NARCAN) 4 MG/0.1ML nasal spray     1 spray intranasally. If pt does not respond or relapses into respiratory depression call 911. Give additional doses every 2-3 min.     nitrofurantoin, macrocrystal-monohydrate, (MACROBID) 100 MG capsule     Take 1 capsule (100 mg total) by mouth 2 (two) times daily for 10 days     ondansetron (Zofran ODT) 4 MG disintegrating tablet     Take 1 tablet (4 mg total) by mouth every 8 (eight) hours as needed for Nausea     pregabalin (LYRICA) 100 MG capsule     Take 1 capsule (100 mg total) by mouth 2 (two) times daily     tiZANidine (ZANAFLEX) 4 MG tablet     Take 2 tablets (8 mg total) by mouth every 8 (eight) hours as needed (fibromyalgia pain)     traZODone (DESYREL) 150 MG tablet     Take 1-2 tabs PO PRN at HS.     venlafaxine (EFFEXOR-XR) 37.5 MG 24 hr capsule     Take 150 mg by mouth                        Flagged for Removal             ivermectin (STROMECTOL) 3 MG Tab     Take 3.5 tablets (10,500 mcg total) by mouth once a week for 2 doses           Review of Systems   Constitutional: Negative for chills and fever.   HENT:        Denies anosmia or ageusia   Respiratory: Negative for cough and shortness of breath.    Cardiovascular: Negative  for chest pain and palpitations.   Gastrointestinal: Positive for abdominal distention, abdominal pain, nausea and vomiting. Negative for constipation and diarrhea.   Genitourinary: Negative for dysuria, frequency and urgency.   Musculoskeletal: Negative for neck pain and neck stiffness.   Skin: Negative for rash.   Neurological: Negative for dizziness and headaches.          Physical Exam    BP: 94/57, Heart Rate: 75, Temp: 98.7 F (37.1 C), Resp Rate: 22, SpO2: 97 %, Weight: 60.3 kg    Physical Exam  Vitals signs and nursing note reviewed.   Constitutional:       General: She is in acute distress (appears uncomfortable).      Appearance: Normal appearance. She is not toxic-appearing.   HENT:      Head: Normocephalic and atraumatic.      Right Ear: External ear normal.      Left Ear: External ear normal.      Nose: Nose normal.      Mouth/Throat:      Mouth: Mucous membranes are moist.      Pharynx: Oropharynx is clear.   Eyes:      General: No scleral icterus.     Conjunctiva/sclera: Conjunctivae normal.   Neck:      Musculoskeletal: Neck supple.   Cardiovascular:      Rate and Rhythm: Normal rate.      Heart sounds: S1 normal and S2 normal.   Pulmonary:      Effort: Pulmonary effort is normal.      Breath sounds: Normal breath sounds.  Abdominal:      General: Bowel sounds are increased. There is distension (hypertympanic to percussion).      Tenderness: There is abdominal tenderness. There is guarding and rebound.   Skin:     General: Skin is warm and dry.      Capillary Refill: Capillary refill takes less than 2 seconds.   Neurological:      General: No focal deficit present.      Mental Status: She is alert.   Psychiatric:         Attention and Perception: Attention normal.         Mood and Affect: Affect normal.         Speech: Speech normal.         Behavior: Behavior normal. Behavior is cooperative.       Results     Procedure Component Value Units Date/Time    Comprehensive metabolic panel [[id number]]   (Abnormal) Collected: 03/11/19 1432    Specimen: Plasma Updated: 03/11/19 1503     Sodium 144 mMol/L      Potassium 3.4 mMol/L      Chloride 111 mMol/L      CO2 23.0 mMol/L      Calcium 8.1 mg/dL      Glucose 79 mg/dL      Creatinine 4.09 mg/dL      BUN 4 mg/dL      Protein, Total 6.0 gm/dL      Albumin 3.5 gm/dL      Alkaline Phosphatase 87 U/L      ALT 20 U/L      AST (SGOT) 24 U/L      Bilirubin, Total 0.3 mg/dL      Albumin/Globulin Ratio 1.40 Ratio      Anion Gap 13.4 mMol/L      BUN / Creatinine Ratio 5.5 Ratio      EGFR 109 mL/min/1.74m2      Osmolality Calculated 283 mOsm/kg      Globulin 2.5 gm/dL     Lipase [[id number]] Collected: 03/11/19 1432    Specimen: Plasma Updated: 03/11/19 1503     Lipase 47 U/L     Magnesium [[id number]] Collected: 03/11/19 1432    Specimen: Plasma Updated: 03/11/19 1503     Magnesium 1.6 mg/dL     CBC and differential [[id number]]  (Abnormal) Collected: 03/11/19 1432    Specimen: Blood Updated: 03/11/19 1454     WBC 11.4 K/cmm      RBC 4.41 M/cmm      Hemoglobin 14.5 gm/dL      Hematocrit 52.8 %      MCV 98 fL      MCH 33 pg      MCHC 34 gm/dL      RDW 41.3 %      PLT CT 252 K/cmm      MPV 8.4 fL      Neutrophils % 67.0 %      Lymphocytes 19.0 %      Monocytes 9.0 %      Eosinophils % 3.0 %      Basophils % 1.0 %      Bands 1 %      Neutrophils Absolute 7.8 K/cmm      Lymphocytes Absolute 2.2 K/cmm      Monocytes Absolute 1.0 K/cmm      Eosinophils Absolute 0.3 K/cmm      Basophils Absolute 0.1 K/cmm      RBC Morphology Morphology Consistent with Hemogram  Narrative:      Manual differential performed    HCG, Qualitative, Urine [[id number]] Collected: 03/11/19 1435    Specimen: Urine, Random Updated: 03/11/19 1447     Urine HCG Qualitative Negative    Urinalysis w Microscopic and Culture if Indicated [[id number]]  (Abnormal) Collected: 03/11/19 1402    Specimen: Urine, Random Updated: 03/11/19 1422     Color, UA Yellow     Clarity, UA Clear     Urine Specific Gravity 1.015     pH,  Urine 7.0 pH      Protein, UR Negative mg/dL      Glucose, UA Negative mg/dL      Ketones UA Negative mg/dL      Bilirubin, UA Negative mg/dL      Blood, UA Negative mg/dL      Nitrite, UA Positive     Urobilinogen, UA 2.0 mg/dL      Leukocyte Esterase, UA Negative Leu/uL      UR Micro Performed     WBC, UA None Seen /hpf      RBC, UA None Seen /hpf      Bacteria, UA Many /hpf      Squam Epithel, UA 1-5 /lpf     Narrative:      A Urine Culture has been ordered based upon the Positive UA results.    Urine Culture [[id number]] Collected: 03/11/19 1402    Specimen: Urine, Random Updated: 03/11/19 1408        Radiology Results (24 Hour)     Procedure Component Value Units Date/Time    XR Abdomen 2 View With CXR [[id number]] Collected: 03/11/19 1521    Order Status: Completed Updated: 03/11/19 1526    Narrative:      Clinical History:  Probable small bowel obstruction    Ordering Comments:   Probable small bowel obstruction      Study Notes:       Examination:  XR ABDOMEN 2 VIEW WITH CHEST 1 VIEW    Comparison:  None available.    Findings:  The heart is normal size.  Pulmonary vascularity unremarkable.  Lungs clear.  No pleural effusions.    There is significant gaseous distention of small bowel loops measuring up to 6.9 cm..  No obvious masses or worrisome calcifications.  No free gas or pneumatosis.    Bones and soft tissues unremarkable.      Impression:      Gaseous distention of small bowel loops measuring up to 6.9 cm, slightly worsened since the recent CT study of the abdomen. Findings are likely related to worsening small bowel obstruction.    ReadingStation:[id number]              MDM and ED Course     ED Medication Orders (From admission, onward)    Start Ordered     Status Ordering Provider    03/11/19 1446 03/11/19 1445  ondansetron (ZOFRAN-ODT) disintegrating tablet 4 mg  Once     Route: Oral  Ordered Dose: 4 mg     Last MAR action: Given Braxton Vantrease DAVID    03/11/19 1446 03/11/19 1445  HYDROmorphone  (DILAUDID) injection 1 mg  Once in ED     Route: Intramuscular  Ordered Dose: 1 mg     Last MAR action: Given Leana Springston DAVID    03/11/19 1353 03/11/19 1352    Once in ED     Route: Intravenous  Ordered Dose: 0.5 mg     Discontinued Jaydenn Boccio  DAVID    03/11/19 1353 03/11/19 1352    Once     Route: Intravenous  Ordered Dose: 4 mg     Discontinued Analese Sovine DAVID             MDM  Number of Diagnoses or Management Options  Diagnosis management comments: We do not presently have a working CT scanner at [facility]. I spoke to Dr Laural Benes, [facility], who accepts patient to the ER to complete her workup. If her KUB here reveals a large gastric bubble, I'll decompress her with an NGT before transfer.  Labs and X-ray are all pending at the time of her acceptance at [facility] ED by Dr Laural Benes.    Risk of Complications, Morbidity, and/or Mortality  Presenting problems: moderate  Diagnostic procedures: moderate  Management options: moderate                     Procedures    Clinical Impression & Disposition     Clinical Impression  Final diagnoses:   SBO (small bowel obstruction)        ED Disposition     ED Disposition Condition Date/Time Comment    Transfer to Another Facility  Tue Mar 11, 2019  2:02 PM Dalina Oluwadara Pelon should be transferred out to [facility] ED.           New Prescriptions    No medications on file                 Elenor Quinones, MD  03/11/19 1541

## 2019-03-11 NOTE — ED Notes (Signed)
Bed: C14-A  Expected date:   Expected time:   Means of arrival:   Comments:  TX- Page 32 F ABD Pain

## 2019-03-11 NOTE — ED Notes (Signed)
Called [employer] and spoke with Anesha. Was given conformation of #[id number]  Called [facility] and gave Shanda Bumps the number.

## 2019-03-11 NOTE — ED Triage Notes (Signed)
Patient presents with RUQ/TLQ abd pain. She had surgery recently for SBO and was d/c'd from [facility] 03/10/19. She has hx of SBO with adhesions, colectomy, and "chronic inertia." She vomited and has not had a BM in 2 days. Only intake today was small amount of oral fluids.    She is A&O x 4 and VSS at this time.

## 2019-03-11 NOTE — ED Notes (Signed)
ETA for VMT is 2 hours

## 2019-03-11 NOTE — H&P (Signed)
HISTORY AND PHYSICAL - ACCESS   Name:  [name], [name]                 MR#:  [id number]               Date:  03/11/19       IMPRESSION:   34 y.o. female with SBO (small bowel obstruction).    Additional Diagnoses being addressed this admission also include:  Active Hospital Problems    Diagnosis    Gastroesophageal reflux disease    SBO (small bowel obstruction)    Bipolar 2 disorder, major depressive episode    Human papilloma virus infection    Chronic hepatitis C     36F with recurrent adhesive SBO.  Discharged one day prior.  CT pending    PLAN:   Admit to Surgical floor.  CT abdomen and pelvis pending  Will likely need NG tube, enteral contrast challenge  No urgent indication for OR, complicated abdomen given surgical history     [name], MD    [phone]  [facility] ACCESS Program  - Phone [phone] or Pager [phone]    CC:  Patient complains of generalized abdominal pain.      HPI:   34 y.o. 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  She was admitted recently and had seemingly resolved and was discharged home.  After discharge her abdominal pain returned as did her distention and obstipation.  This worsened throughout the night to today and led to her presentation.  Has nausea, vomiting, denies fevers and chills.  No other new complaints since yesterday.  Has prior discussion for elective adhesiolysis but was unable to schedule that at her local hospital.    PMH:   She  has a past medical history of Anxiety, Bipolar 1 disorder, Chronic abdominal pain, Chronic obstructive pulmonary disease, Fibromyalgia, Gastroesophageal reflux disease, Insomnia, Seasonal allergic rhinitis, Small bowel obstruction, and Small bowel obstruction due to adhesions.     PSH:   She  has a past surgical history that includes COLON  RESECTION; Colon surgery; Cholecystectomy; Appendectomy; and LAPAROTOMY, ABDOMINAL EXPLORATION.     Social History:   She  reports that she has been smoking cigarettes. She has been smoking about 1.00 pack per day. She has never used smokeless tobacco. She reports previous drug use. She reports that she does not drink alcohol.    Family History:   Family history has been reviewed and non contributory.    Home Medications:     Outpatient Medications Marked as Taking for the 03/11/19 encounter [facility] Encounter)   Medication Sig Dispense Refill    acetaminophen-codeine (TYLENOL #3) 300-30 MG per tablet Take 1 tablet by mouth 3 (three) times daily         albuterol sulfate HFA (PROVENTIL) 108 (90 Base) MCG/ACT inhaler Inhale 2 puffs into the lungs every 4 (four) hours as needed for Wheezing or Shortness of Breath 1 Inhaler 2    clonazePAM (KlonoPIN) 0.5 MG tablet Take 1 tablet (0.5 mg total) by mouth 2 (two) times daily This is to be taken over by psychiatry on Nov 25, 2018 60 tablet 1    diphenoxylate-atropine (Lomotil) 2.5-0.025 MG per tablet Take 2 tablets by mouth 4 (four) times daily 240 tablet 2    fluticasone-salmeterol (ADVAIR HFA) 230-21 MCG/ACT inhaler Inhale 2 puffs into  the lungs 2 (two) times daily 12 g 2    ondansetron (Zofran ODT) 4 MG disintegrating tablet Take 1 tablet (4 mg total) by mouth every 8 (eight) hours as needed for Nausea 30 tablet 0    pregabalin (LYRICA) 100 MG capsule Take 1 capsule (100 mg total) by mouth 2 (two) times daily 60 capsule 1    tiZANidine (ZANAFLEX) 4 MG tablet Take 2 tablets (8 mg total) by mouth every 8 (eight) hours as needed (fibromyalgia pain) 30 tablet 0    topiramate (TOPAMAX) 25 MG tablet Take 25 mg by mouth 2 (two) times daily      traZODone (DESYREL) 300 MG tablet Take 300 mg by mouth nightly      venlafaxine (EFFEXOR-XR) 150 MG 24 hr capsule Take 150 mg by mouth every morning         Allergies:   She is allergic to fentanyl; reglan [metoclopramide];  rocephin [ceftriaxone]; tramadol; and vancomycin.     ROS      (10+):   Pertinent details are included in HPI.    Constitutional:  Negative for all symptoms reviewed.   ENT:  Negative for all symptoms reviewed.   Pulmonary:  Negative for all symptoms reviewed.   Cardiovascular:  Negative for all symptoms reviewed.   GI:  Negative for all symptoms reviewed.   GU:  Negative for all symptoms reviewed.   Musculoskeletal:  Negative for all symptoms reviewed.   Neuro:  Negative for all symptoms reviewed.  Heme/Lymph:  Negative for all symptoms reviewed.   Psych:  Negative for all symptoms reviewed.   Skin:  Negative for all symptoms reviewed.   Ophtho:  Negative for all symptoms reviewed.   Allergy/Immuno:  Negative for all symptoms reviewed.      Physical Exam      (8+)   Blood pressure 122/74, pulse (!) 59, temperature 99 F (37.2 C), temperature source Tympanic, resp. rate 16, height 1.651 m (5\' 5" ), weight 60.3 kg (133 lb), last menstrual period 02/24/2019, SpO2 95 %.   General: appears uncomfortable due to pain  HEENT(2-eye/+): normocephalic, atraumatic  Neck(0): supple  Chest: unlabored respirations, no accessory muscle use  Cardiac: regular rate and rhythm  Abdomen: softly distended, tender to palpation, no peritonitis, multiple well healed incisions  GU/Pelvis(0): normal  Extremities: no edema and no tenderness or deformities noted  Neuro: alert, speech normal, oriented x 3, no gross focal neurologic deficits, moves all extremities well  Psych: normal affect, insight good  Back(0): inspection of back is normal  Skin: no jaundice, no rashes    Labs:    Lab results have been reviewed as follows:  OSH Labs:  Na 144, K 3.4, Cl 111, bicarb 23, BUN 4, Cr 0.73, gluc 79  OSH Labs:  WBC 11.4, Hb 14.5, Hct 43, plt 252    Radiology:   Xr Abdomen 2 View With Cxr    Result Date: 03/11/2019  Gaseous distention of small bowel loops measuring up to 6.9 cm, slightly worsened since the recent CT study of the abdomen. Findings are  likely related to worsening small bowel obstruction. ReadingStation:[id number]      Outside films: none
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
## 2019-03-11 NOTE — ED Notes (Signed)
Local redness and itching around IV site noted after administration of IV morphine and zofran. Per patient she has had more severe reactions to morphine before. During prior assessment of drug allergies, however, patient denied allergy to morphine.    PA Boykin Peek aware - see orders.
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
## 2019-03-11 NOTE — Progress Notes (Signed)
PROGRESS NOTE      Patient Name: [name]  Primary Care Physician: Kathi Simpers, NP      History of Presenting Illness:   [name] is a 34 y.o. female who presents to the office via telehealth with severe abdominal pain. She reports she ws hospitalized from 03-08-19 to 03-10-19 for a small bowel obstruction.   Chief Complaint   Patient presents with    Small bowel obstruction     S/P Hospitalization  03/08/19-03/10/19   Abdominal pain  Presents for evaluation of abdominal pain. Onset was 4 days ago. Symptoms have been gradually worsening. The pain is described as colicky, cramping, pressure-like and shooting, and is 10/10 in intensity. Pain is located in the epigastric region without radiation.  Aggravating factors: activity, eating, movement and standing.  Alleviating factors: none. Associated symptoms: anorexia and nausea. The patient denies diarrhea, hematochezia and hematuria.  She reports she has been unable to sleep because of the pain. She states the pain is worse now than it was when she went to the hospital on 03-08-2019. She reports the pain radiates to her back. She is having nausea but no vomiting. She has been able to drink fluids but the pain is worse with eating.     Past Medical History:     Past Medical History:   Diagnosis Date    Anxiety     Bipolar 1 disorder     Chronic abdominal pain     Chronic obstructive pulmonary disease     Fibromyalgia     Gastroesophageal reflux disease     Insomnia     Seasonal allergic rhinitis        Past Surgical History:     Past Surgical History:   Procedure Laterality Date    APPENDECTOMY      CHOLECYSTECTOMY      COLON RESECTION      COLON SURGERY      LAPAROTOMY, ABDOMINAL EXPLORATION      The patient has had multiple prior abdominal explorations, including colon resection, ileostomy's, and surgery for reported Crohn's disease.         Family History:     Family History   Problem Relation Age of Onset     Hypertension Mother     Diabetes Mother     Leukemia Father     Liver disease Maternal Grandfather     Hyperlipidemia Paternal Grandfather        Social History:     Social History     Tobacco Use   Smoking Status Current Every Day Smoker    Packs/day: 1.00    Types: Cigarettes   Smokeless Tobacco Never Used     Social History     Substance and Sexual Activity   Alcohol Use Never    Frequency: Never     Social History     Substance and Sexual Activity   Drug Use Not Currently    Comment: opiates -- last use 2013       Allergies:     Allergies   Allergen Reactions    Fentanyl Anaphylaxis    Reglan [Metoclopramide]     Rocephin [Ceftriaxone]     Tramadol     Vancomycin        Medications:     Prior to Admission medications    Medication Sig Start Date End Date Taking? Authorizing Provider   acetaminophen-codeine (TYLENOL #3) 300-30 MG per tablet TAKE 1 TABLET BY MOUTH THREE TIMES  DAILY FOR 28 DAYS 03/07/19  Yes [provider]   albuterol sulfate HFA (PROVENTIL) 108 (90 Base) MCG/ACT inhaler Inhale 2 puffs into the lungs every 4 (four) hours as needed for Wheezing or Shortness of Breath 12/13/18 12/13/19 Yes Sharp, Skyler T, NP   Butalbital-APAP-Caffeine 50-300-40 MG Cap butalbital-acetaminophen-caffeine 50 mg-300 mg-40 mg capsule   TK 1 TO 2 CS PO Q 4 TO 6 H PRN FOR HEADACHE   Yes [provider]   ciprofloxacin (CIPRO) 500 MG tablet ciprofloxacin 500 mg tablet   TK 1 T PO Q 12 H FOR 7 DAYS   Yes [provider]   clonazePAM (KlonoPIN) 0.5 MG tablet Take 1 tablet (0.5 mg total) by mouth 2 (two) times daily This is to be taken over by psychiatry on Nov 25, 2018 10/17/18 10/17/19 Yes Marja Kays T, NP   diphenoxylate-atropine (Lomotil) 2.5-0.025 MG per tablet Take 2 tablets by mouth 4 (four) times daily 03/04/19 03/03/20 Yes Sharp, Skyler T, NP   fluconazole (DIFLUCAN) 200 MG tablet fluconazole 200 mg tablet   TK 1 T PO D FOR 14 DAYS   Yes [provider]    fluticasone-salmeterol (ADVAIR HFA) 230-21 MCG/ACT inhaler Inhale 2 puffs into the lungs 2 (two) times daily 12/13/18  Yes Sharp, Skyler T, NP   ivermectin (STROMECTOL) 3 MG Tab Take 3.5 tablets (10,500 mcg total) by mouth once a week for 2 doses 03/06/19 03/14/19 Yes Sharp, Skyler T, NP   naloxone (NARCAN) 4 MG/0.1ML nasal spray 1 spray intranasally. If pt does not respond or relapses into respiratory depression call 911. Give additional doses every 2-3 min. 03/10/19  Yes Arther Abbott, MD   nitrofurantoin, macrocrystal-monohydrate, (MACROBID) 100 MG capsule Take 1 capsule (100 mg total) by mouth 2 (two) times daily for 10 days 03/10/19 03/20/19 Yes Vida Rigger, MD   ondansetron (Zofran ODT) 4 MG disintegrating tablet Take 1 tablet (4 mg total) by mouth every 8 (eight) hours as needed for Nausea 03/06/19  Yes Marja Kays T, NP   pregabalin (LYRICA) 100 MG capsule Take 1 capsule (100 mg total) by mouth 2 (two) times daily 11/15/18 11/15/19 Yes Sharp, Skyler T, NP   tiZANidine (ZANAFLEX) 4 MG tablet Take 2 tablets (8 mg total) by mouth every 8 (eight) hours as needed (fibromyalgia pain) 02/05/19  Yes Deviers, Richarda Osmond, NP   traZODone (DESYREL) 150 MG tablet Take 1-2 tabs PO PRN at HS. 12/13/18  Yes Sharp, Skyler T, NP   venlafaxine (EFFEXOR-XR) 37.5 MG 24 hr capsule Take 150 mg by mouth    01/21/19  Yes [provider]   HYDROcodone-acetaminophen (NORCO) 7.5-325 MG per tablet Take 1 tablet by mouth every 6 (six) hours as needed for Pain 03/10/19 03/11/19  Arther Abbott, MD       Review of Systems:      14 point review of systems were negative except for what was note in the HPI.    Physical Exam:   There were no vitals filed for this visit.  There is no height or weight on file to calculate BMI.    General: mild distress.   Lungs: non-labored breathing  Neuro:  alert and oriented.   Psychiatric/Behavioral: tearful    Recent Results (from the past 336 hour(s))   Beta HCG, Qual, Serum    Collection Time:  03/07/19  8:14 PM   Result Value Ref Range    BHCG Qualitative Negative Negative   CBC and differential  Collection Time: 03/07/19  8:14 PM   Result Value Ref Range    WBC 14.8 (H) 4.0 - 11.0 K/cmm    RBC 4.31 3.80 - 5.00 M/cmm    Hemoglobin 13.8 12.0 - 16.0 gm/dL    Hematocrit 16.1 09.6 - 48.0 %    MCV 98 80 - 100 fL    MCH 32 28 - 35 pg    MCHC 33 31 - 36 gm/dL    RDW 04.5 40.9 - 81.1 %    PLT CT 258 130 - 440 K/cmm    MPV 9.0 6.0 - 10.0 fL    Neutrophils % 75.1 42.0 - 78.0 %    Lymphocytes 15.9 15.0 - 46.0 %    Monocytes 7.3 3.0 - 15.0 %    Eosinophils % 1.2 0.0 - 7.0 %    Basophils % 0.5 0.0 - 3.0 %    Neutrophils Absolute 11.1 (H) 1.7 - 8.6 K/cmm    Lymphocytes Absolute 2.3 0.6 - 5.1 K/cmm    Monocytes Absolute 1.1 0.1 - 1.7 K/cmm    Eosinophils Absolute 0.2 0.0 - 0.8 K/cmm    Basophils Absolute 0.1 0.0 - 0.3 K/cmm   Comprehensive metabolic panel    Collection Time: 03/07/19  8:14 PM   Result Value Ref Range    Sodium 138 136 - 147 mMol/L    Potassium 3.4 (L) 3.5 - 5.3 mMol/L    Chloride 108 98 - 110 mMol/L    CO2 21.0 20.0 - 30.0 mMol/L    Calcium 8.6 8.5 - 10.5 mg/dL    Glucose 914 (H) 71 - 99 mg/dL    Creatinine 7.82 9.56 - 1.20 mg/dL    BUN 8 7 - 22 mg/dL    Protein, Total 6.9 6.0 - 8.3 gm/dL    Albumin 4.0 3.5 - 5.0 gm/dL    Alkaline Phosphatase 94 40 - 145 U/L    ALT 12 0 - 55 U/L    AST (SGOT) 13 10 - 42 U/L    Bilirubin, Total 0.5 0.1 - 1.2 mg/dL    Albumin/Globulin Ratio 1.38 0.80 - 2.00 Ratio    Anion Gap 12.4 7.0 - 18.0 mMol/L    BUN / Creatinine Ratio 9.6 (L) 10.0 - 30.0 Ratio    EGFR 94 60 - 150 mL/min/1.90m2    Osmolality Calculated 274 (L) 275 - 300 mOsm/kg    Globulin 2.9 2.0 - 4.0 gm/dL   Urinalysis w Microscopic and Culture if Indicated    Collection Time: 03/07/19  8:25 PM    Specimen: Urine, Random   Result Value Ref Range    Color, UA Yellow Colorless,Yellow    Clarity, UA Clear Clear    Urine Specific Gravity >=1.030 1.001 - 1.040    pH, Urine 6.0 5.0 - 8.0 pH    Protein, UR Negative  Negative mg/dL    Glucose, UA Negative Negative mg/dL    Ketones UA Negative Negative mg/dL    Bilirubin, UA Negative Negative mg/dL    Blood, UA 3+ (A) Negative mg/dL    Nitrite, UA Positive (A) Negative    Urobilinogen, UA 0.2 0.2 mg/dL    Leukocyte Esterase, UA Negative Negative Leu/uL    UR Micro Performed     WBC, UA 10-15 (A) 1-2,3-4,None Seen /hpf    RBC, UA 10-15 (A) None Seen,1-2,3-4 /hpf    Bacteria, UA Many (A) None /hpf    Squam Epithel, UA 30-40 (A) 1 - 5 /lpf   ECG 12  lead    Collection Time: 03/08/19  1:39 AM   Result Value Ref Range    Patient Age 30 years    Patient DOB 1985-05-01     Patient Height      Patient Weight      Interpretation Text       Sinus rhythm  Compared to ECG 12/07/2018 20:50:22  Sinus bradycardia no longer present  Short PR interval no longer present  Electronically Signed On 03-08-2019 5:43:02 EST by Retta Diones      Physician Interpreter Retta Diones     Ventricular Rate 68 //min    QRS Duration 89 ms    P-R Interval 122 ms    Q-T Interval 392 ms    Q-T Interval(Corrected) 417 ms    P Wave Axis -3 deg    QRS Axis 77 deg    T Axis 47 years   CBC and differential    Collection Time: 03/08/19  3:31 AM   Result Value Ref Range    WBC 9.1 4.0 - 11.0 K/cmm    RBC 3.66 (L) 3.80 - 5.00 M/cmm    Hemoglobin 12.7 12.0 - 16.0 gm/dL    Hematocrit 54.0 98.1 - 48.0 %    MCV 101 (H) 80 - 100 fL    MCH 35 28 - 35 pg    MCHC 34 32 - 36 gm/dL    RDW 19.1 47.8 - 29.5 %    PLT CT 225 130 - 440 K/cmm    MPV 8.8 6.0 - 10.0 fL    Neutrophils % 52.8 42.0 - 78.0 %    Lymphocytes 33.8 15.0 - 46.0 %    Monocytes 10.1 3.0 - 15.0 %    Eosinophils % 2.6 0.0 - 7.0 %    Basophils % 0.7 0.0 - 3.0 %    Neutrophils Absolute 4.8 1.7 - 8.6 K/cmm    Lymphocytes Absolute 3.1 0.6 - 5.1 K/cmm    Monocytes Absolute 0.9 0.1 - 1.7 K/cmm    Eosinophils Absolute 0.2 0.0 - 0.8 K/cmm    Basophils Absolute 0.1 0.0 - 0.3 K/cmm   Basic Metabolic Panel    Collection Time: 03/08/19  3:31 AM   Result Value Ref Range    Sodium 141 136  - 147 mMol/L    Potassium 3.6 3.5 - 5.3 mMol/L    Chloride 114 (H) 98 - 110 mMol/L    CO2 22 20 - 30 mMol/L    Calcium 7.9 (L) 8.5 - 10.5 mg/dL    Glucose 95 71 - 99 mg/dL    Creatinine 6.21 3.08 - 1.20 mg/dL    BUN 6 (L) 7 - 22 mg/dL    Anion Gap 8.6 7.0 - 18.0 mMol/L    BUN / Creatinine Ratio 8.5 (L) 10.0 - 30.0 Ratio    EGFR 113 60 - 150 mL/min/1.3m2    Osmolality Calculated 279 275 - 300 mOsm/kg   Magnesium    Collection Time: 03/08/19  3:31 AM   Result Value Ref Range    Magnesium 1.6 1.6 - 2.6 mg/dL   Lactic Acid    Collection Time: 03/08/19 12:58 PM   Result Value Ref Range    Lactic Acid 0.6 0.5 - 1.9 mMol/L   Basic Metabolic Panel    Collection Time: 03/09/19  7:19 AM   Result Value Ref Range    Sodium 141 136 - 147 mMol/L    Potassium 3.5 3.5 - 5.3 mMol/L    Chloride 115 (  H) 98 - 110 mMol/L    CO2 20 20 - 30 mMol/L    Calcium 8.0 (L) 8.5 - 10.5 mg/dL    Glucose 95 71 - 99 mg/dL    Creatinine 5.40 9.81 - 1.20 mg/dL    BUN 9 7 - 22 mg/dL    Anion Gap 9.5 7.0 - 18.0 mMol/L    BUN / Creatinine Ratio 12.3 10.0 - 30.0 Ratio    EGFR 109 60 - 150 mL/min/1.37m2    Osmolality Calculated 280 275 - 300 mOsm/kg   Magnesium    Collection Time: 03/09/19  7:19 AM   Result Value Ref Range    Magnesium 1.8 1.6 - 2.6 mg/dL   Phosphorus    Collection Time: 03/09/19  7:19 AM   Result Value Ref Range    Phosphorus 1.7 (L) 2.3 - 4.7 mg/dL   Magnesium    Collection Time: 03/09/19  2:34 PM   Result Value Ref Range    Magnesium 1.7 1.6 - 2.6 mg/dL          Assessment:   Abdominal pain, recent history of small bowel obstruction. Worsening pain.    Plan:   She reports her pain is worse now than prior to hospitalization. Suggest the patient return to the hospital for further evaluation and management of abdominal pain.   Follow up as needed.  Requested Prescriptions      No prescriptions requested or ordered in this encounter   This visit was conducted with the use of interactive audio/video telecommunications that permitted  real-time communication between [employer] and myself. Kaylenn consented to practice patient and received services at home, while I was located at [facility].    Patient was counseled on possible medicine side effects which may include rash, swelling and/or stomach upset.  Patient was instructed to notify me or the ER if they experience problems.    No orders of the defined types were placed in this encounter.       Before leaving the office, the patient was informed of all ordered diagnostic tests and consults.     If you have not heard back from [place] about test results in 14 days, please call the office at [phone].    Signed by: York Cerise, NP    Supervising Doctor: Rogelio Seen, MD    The patient's electronic medical record was reviewed, any changes in the past medical history, past surgical history, medications, diagnostic tests were noted, and the record was updated accordingly.     Discussed option available for my chart which provides electronic access to diagnostic results.

## 2019-03-11 NOTE — ED Notes (Signed)
IV access attempted by staff members with ultrasound machine. Unable to thread catheter due to scar tissue. Able to collect blood samples. P.O. medication given instead of IV.

## 2019-03-12 DIAGNOSIS — K56609 Unspecified intestinal obstruction, unspecified as to partial versus complete obstruction: Secondary | ICD-10-CM

## 2019-03-12 LAB — CBC AND DIFFERENTIAL
Basophils %: 0.6 % (ref 0.0–3.0)
Basophils Absolute: 0.1 10*3/uL (ref 0.0–0.3)
Eosinophils %: 1.9 % (ref 0.0–7.0)
Eosinophils Absolute: 0.2 10*3/uL (ref 0.0–0.8)
Hematocrit: 40.6 % (ref 36.0–48.0)
Hemoglobin: 13.8 gm/dL (ref 12.0–16.0)
Lymphocytes Absolute: 2.7 10*3/uL (ref 0.6–5.1)
Lymphocytes: 27.9 % (ref 15.0–46.0)
MCH: 34 pg (ref 28–35)
MCHC: 34 gm/dL (ref 32–36)
MCV: 100 fL (ref 80–100)
MPV: 8.9 fL (ref 6.0–10.0)
Monocytes Absolute: 1 10*3/uL (ref 0.1–1.7)
Monocytes: 9.9 % (ref 3.0–15.0)
Neutrophils %: 59.6 % (ref 42.0–78.0)
Neutrophils Absolute: 5.8 10*3/uL (ref 1.7–8.6)
PLT CT: 252 10*3/uL (ref 130–440)
RBC: 4.07 10*6/uL (ref 3.80–5.00)
RDW: 12.6 % (ref 11.0–14.0)
WBC: 9.7 10*3/uL (ref 4.0–11.0)

## 2019-03-12 LAB — BASIC METABOLIC PANEL
Anion Gap: 10.9 mMol/L (ref 7.0–18.0)
BUN / Creatinine Ratio: 5.5 Ratio — ABNORMAL LOW (ref 10.0–30.0)
BUN: 4 mg/dL — ABNORMAL LOW (ref 7–22)
CO2: 24 mMol/L (ref 20–30)
Calcium: 8.2 mg/dL — ABNORMAL LOW (ref 8.5–10.5)
Chloride: 110 mMol/L (ref 98–110)
Creatinine: 0.73 mg/dL (ref 0.60–1.20)
EGFR: 109 mL/min/{1.73_m2} (ref 60–150)
Glucose: 94 mg/dL (ref 71–99)
Osmolality Calculated: 280 mOsm/kg (ref 275–300)
Potassium: 2.9 mMol/L — CL (ref 3.5–5.3)
Sodium: 142 mMol/L (ref 136–147)

## 2019-03-12 MED ORDER — LACTATED RINGERS IV SOLN
INTRAVENOUS | Status: DC
Start: 2019-03-12 — End: 2019-03-14

## 2019-03-12 MED ORDER — HYDROCODONE-ACETAMINOPHEN 7.5-325 MG PO TABS
1.0000 | ORAL_TABLET | Freq: Four times a day (QID) | ORAL | Status: DC | PRN
Start: 2019-03-12 — End: 2019-03-14
  Administered 2019-03-13 – 2019-03-14 (×2): 1 via ORAL
  Filled 2019-03-12 (×3): qty 1

## 2019-03-12 MED ORDER — ENOXAPARIN SODIUM 40 MG/0.4ML SC SOLN
40.00 mg | SUBCUTANEOUS | Status: DC
Start: 2019-03-12 — End: 2019-03-14
  Filled 2019-03-12 (×2): qty 0.4

## 2019-03-12 MED ORDER — MORPHINE SULFATE 4 MG/ML IJ/IV SOLN (WRAP)
Status: AC
Start: 2019-03-12 — End: ?
  Filled 2019-03-12: qty 1

## 2019-03-12 MED ORDER — KETOROLAC TROMETHAMINE 30 MG/ML IJ SOLN
15.00 mg | Freq: Four times a day (QID) | INTRAMUSCULAR | Status: DC
Start: 2019-03-12 — End: 2019-03-14
  Administered 2019-03-12 – 2019-03-14 (×7): 15 mg via INTRAVENOUS
  Filled 2019-03-12 (×14): qty 1

## 2019-03-12 MED ORDER — MORPHINE SULFATE 4 MG/ML IJ/IV SOLN (WRAP)
3.0000 mg | Status: DC | PRN
Start: 2019-03-12 — End: 2019-03-14
  Administered 2019-03-12 – 2019-03-14 (×17): 3 mg via INTRAVENOUS
  Filled 2019-03-12 (×11): qty 1

## 2019-03-12 MED ORDER — CLONAZEPAM 0.5 MG PO TABS
0.5000 mg | ORAL_TABLET | Freq: Two times a day (BID) | ORAL | Status: DC
Start: 2019-03-13 — End: 2019-03-14
  Administered 2019-03-13 – 2019-03-14 (×3): 0.5 mg via ORAL
  Filled 2019-03-12 (×3): qty 1

## 2019-03-12 MED ORDER — POTASSIUM CHLORIDE 10 MEQ/100ML IV SOLN
INTRAVENOUS | Status: AC
Start: 2019-03-12 — End: ?
  Filled 2019-03-12: qty 600

## 2019-03-12 MED ORDER — VENLAFAXINE HCL ER 75 MG PO CP24
150.00 mg | ORAL_CAPSULE | Freq: Every morning | ORAL | Status: DC
Start: 2019-03-12 — End: 2019-03-12
  Filled 2019-03-12: qty 2

## 2019-03-12 MED ORDER — DIAZEPAM 5 MG/ML IJ SOLN
2.50 mg | Freq: Once | INTRAMUSCULAR | Status: AC
Start: 2019-03-12 — End: 2019-03-12
  Administered 2019-03-12: 02:00:00 2.5 mg via INTRAVENOUS

## 2019-03-12 MED ORDER — DIAZEPAM 5 MG/ML IJ SOLN
INTRAMUSCULAR | Status: AC
Start: 2019-03-12 — End: ?
  Filled 2019-03-12: qty 2

## 2019-03-12 MED ORDER — FAMOTIDINE 10 MG/ML IV SOLN (WRAP)
20.00 mg | Freq: Two times a day (BID) | INTRAVENOUS | Status: DC
Start: 2019-03-12 — End: 2019-03-14
  Administered 2019-03-12 – 2019-03-14 (×4): 20 mg via INTRAVENOUS
  Filled 2019-03-12 (×5): qty 2

## 2019-03-12 MED ORDER — POTASSIUM CHLORIDE 10 MEQ/100ML IV SOLN
10.00 meq | INTRAVENOUS | Status: AC
Start: 2019-03-12 — End: 2019-03-12
  Administered 2019-03-12 (×6): 10 meq via INTRAVENOUS

## 2019-03-12 MED ORDER — SODIUM CHLORIDE (PF) 0.9 % IJ SOLN
3.00 mL | Freq: Three times a day (TID) | INTRAMUSCULAR | Status: DC
Start: 2019-03-12 — End: 2019-03-14
  Administered 2019-03-12 – 2019-03-14 (×5): 3 mL via INTRAVENOUS

## 2019-03-12 MED ORDER — ENOXAPARIN SODIUM 40 MG/0.4ML SC SOLN
SUBCUTANEOUS | Status: AC
Start: 2019-03-12 — End: ?
  Filled 2019-03-12: qty 0.4

## 2019-03-12 NOTE — Nursing Progress Note (Signed)
Patient has history of seizures.  Patient refuses pads for bed rails.Patient informed of importance of bed pads and continues to refuse.

## 2019-03-12 NOTE — ED Notes (Signed)
PT ringing call bell stating that she is in a lot of pain and requesting pain medication.

## 2019-03-12 NOTE — Progress Notes (Signed)
Readmission Risk  [facility] EMERGENCY DEPARTMENT   Patient Name: [name],[name]   Attending Physician: Rea College, MD   Today's date:   03/12/2019 LOS: 1 days   Expected Discharge Date      Readmission Assessment:                                                              Discharge Planning  ReAdmit Risk Score: 19  Does the patient have perscription coverage?: Yes  Utilize [place] [facility]: No  Confirmed PCP with Pt: Yes  Confirmed PCP name: Marja Kays  Last PCP Visit Date: 03/10/19  Confirm Transport to F/U Appt.: Self/Private Vehicle/Friend  Social Work Referral: Not Applicable  Anticipated Home Health at [place]: (TBD)  Anticipated Placement at [place]: No  CM Comments: (P) 03/12/19 ED SW LW: Pt readm w/ SBO, was recently d/ced from [facility] 2 days ago w/ SBO. readm w/ increasing abd pain, distention, constipation. 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 PTA pt lived w/ spouse and kids, was indep w/ ADLs, used no DMEs, spouse drove. Plan - admit, CT abd/pelvis, may need NG tube, enteral contrast challenge. Pt anticipates dispo home w/ wife support/transport. Had [facility] in 2011 in [place], agreeable to consider if recommended but did not want to make decision at this time, no preference of agency. Floor RNCM/SW to follow for clinical course and changes to dispo plan.      Please consult Treatment Team to see who current CM is and notify them for any d/c needs.    IDPA:      Healthcare Decisions  Interviewed:: Patient  Orientation/Decision Making Abilities of Patient: Alert and Oriented x3, able to make decisions  Prior to admission  Prior level of function: Independent with ADLs, Ambulates independently  Type of Residence: Private residence  Home Layout: One level, Stairs to enter without rails (add number in comment)(2 steps)  Have running water, electricity, heat, etc?:  Yes  Living Arrangements: Spouse/significant other  How do you get to your MD appointments?: spouse  How do you get your groceries?: spouse  Who fixes your meals?: spouse/self  Who does your laundry?: spouse/self  Who picks up your prescriptions?: spouse  Dressing: Unable to assess  Grooming: Unable to assess  Feeding: Unable to assess  Bathing: Unable to assess  Toileting: Unable to assess  Adult Protective Services (APS) involved?: No  Discharge Planning  Support Systems: Spouse/significant other, Children  Patient expects to be discharged to:: home  Anticipated [place] plan discussed with:: Same as interviewed  Potential barriers to discharge:: Testing/procedure  Mode of transportation:: Private car (family member)  Does the patient have perscription coverage?: Yes  Consults/Providers  PT Evaluation Needed: No  OT Evalulation Needed: No  SLP Evaluation Needed: No  Correct PCP listed in Epic?: Yes      30 Day Readmission:   Readmission Patient Interview/Contributing Factors  At discharge, discuss signs/symptoms?: Yes  At discharge, discuss what to do for worsening of disease?: Yes  At discharge, discuss who to contact?: Yes  Asked if you understood instructions?: Yes  D/C Instructions written, given to you?: Yes  D/C Instructions easy to read?:  Yes  Post D/C Follow Up Made?: Yes  When did you go in for D/C follow-up?: 03/10/19  What medications are you currently taking?: as prescribed  When did you fill new prescriptions?: after d/c  How are you taking your medication?: as prescribed  Pt can tell why here/diagnosis: Pt d/ced from [facility] 2/15, went to see PCP, PCP advised to come to [facility], was transferred here  Contributing Factors to Readmission: Expected disease progression, Comorbidities not managed  Patient active with Home Health?: No, not house-bound previously  Patient active with home hospice?: No  Was patient readmitted from a facility?: Not readmitted from a facility  Could admission have been avoided?: no    Provider Notifications:           Sherolyn Buba, MSW  Social Worker   [phone]    Please consult Treatment Team to see who current CM is and notify them for any d/c needs.

## 2019-03-12 NOTE — ED Notes (Signed)
PT ringing call bell requesting pain medication at this time.

## 2019-03-12 NOTE — UM Notes (Signed)
[facility] [employer]    [facility]  Utilization Intake Form                       Date:03/12/19                    AUTHORIZATION #__________________________________     Patient type (circle)X Inpatient   Outpatient   Observation Admission Type:  Medical IP    Patient Name:  [name]    Address:  [address], [place] [address]    Telephone#:[phone]     Medicaid #:  [id number]  SS#:  [id number]  DOB:  [birth year]    Admitting MD:  Gabriel Cirri     Admit Date:  03/11/19    Hospital:  [facility], [address], [place]  [address]    Diagnosis:  K56.50 Small bowel obstruction due to adhesions    Procedure:      OB Delivery Date:  ____/____/____  Sex: (circle) Female   Female    Babys Name:  __________________________________________________________________    Weight:  __________, Apgar:  ___________,  and EGA:  ____________Grams:____________    Pediatrician:  ____________________________________________________________________    Contact Person:  Antoine Poche, RN  Telephone #:  [phone]  Fax#:  [phone]      Fax completed form to [facility] [place] [facility] at [phone]  To contact [employer] Staff for Demographics at [phone]      H&P 03/11/19  IMPRESSION:   34 y.o. female with SBO (small bowel obstruction).    Additional Diagnoses being addressed this admission also include:      Active Hospital Problems    Diagnosis    Gastroesophageal reflux disease    SBO (small bowel obstruction)    Bipolar 2 disorder, major depressive episode    Human papilloma virus infection    Chronic hepatitis C     [name] with recurrent adhesive SBO.  Discharged one day prior.  CT pending    PLAN:   Admit to Surgical floor.  CT abdomen and pelvis pending  Will likely need NG tube, enteral contrast challenge  No urgent indication for OR, complicated abdomen given surgical history     [name], MD      [phone]  [facility] ACCESS Program  - Phone [phone] or Pager  [phone]    CC:  Patient complains of generalized abdominal pain.      HPI:   34 y.o. 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  She was admitted recently and had seemingly resolved and was discharged home.  After discharge her abdominal pain returned as did her distention and obstipation.  This worsened throughout the night to today and led to her presentation.  Has nausea, vomiting, denies fevers and chills.  No other new complaints since yesterday.  Has prior discussion for elective adhesiolysis but was unable to schedule that at her local hospital.    PMH:   She  has a past medical history of Anxiety, Bipolar 1 disorder, Chronic abdominal pain, Chronic obstructive pulmonary disease, Fibromyalgia, Gastroesophageal reflux disease, Insomnia, Seasonal allergic rhinitis, Small bowel obstruction, and Small bowel obstruction due to adhesions.     PSH:   She  has a past surgical history that includes COLON RESECTION; Colon surgery; Cholecystectomy; Appendectomy; and LAPAROTOMY, ABDOMINAL  EXPLORATION.     Social History:   She  reports that she has been smoking cigarettes. She has been smoking about 1.00 pack per day. She has never used smokeless tobacco. She reports previous drug use. She reports that she does not drink alcohol.    Family History:   Family history has been reviewed and non contributory.    Home Medications:     Medications Taking          Outpatient Medications Marked as Taking for the 03/11/19 encounter [facility] Encounter)   Medication Sig Dispense Refill    acetaminophen-codeine (TYLENOL #3) 300-30 MG per tablet Take 1 tablet by mouth 3 (three) times daily         albuterol sulfate HFA (PROVENTIL) 108 (90 Base) MCG/ACT inhaler Inhale 2 puffs into the lungs every 4 (four) hours as needed for Wheezing or  Shortness of Breath 1 Inhaler 2    clonazePAM (KlonoPIN) 0.5 MG tablet Take 1 tablet (0.5 mg total) by mouth 2 (two) times daily This is to be taken over by psychiatry on Nov 25, 2018 60 tablet 1    diphenoxylate-atropine (Lomotil) 2.5-0.025 MG per tablet Take 2 tablets by mouth 4 (four) times daily 240 tablet 2    fluticasone-salmeterol (ADVAIR HFA) 230-21 MCG/ACT inhaler Inhale 2 puffs into the lungs 2 (two) times daily 12 g 2    ondansetron (Zofran ODT) 4 MG disintegrating tablet Take 1 tablet (4 mg total) by mouth every 8 (eight) hours as needed for Nausea 30 tablet 0    pregabalin (LYRICA) 100 MG capsule Take 1 capsule (100 mg total) by mouth 2 (two) times daily 60 capsule 1    tiZANidine (ZANAFLEX) 4 MG tablet Take 2 tablets (8 mg total) by mouth every 8 (eight) hours as needed (fibromyalgia pain) 30 tablet 0    topiramate (TOPAMAX) 25 MG tablet Take 25 mg by mouth 2 (two) times daily      traZODone (DESYREL) 300 MG tablet Take 300 mg by mouth nightly      venlafaxine (EFFEXOR-XR) 150 MG 24 hr capsule Take 150 mg by mouth every morning            Allergies:   She is allergic to fentanyl; reglan [metoclopramide]; rocephin [ceftriaxone]; tramadol; and vancomycin.     ROS      (10+):   Pertinent details are included in HPI.    Constitutional:  Negative for all symptoms reviewed.   ENT:  Negative for all symptoms reviewed.   Pulmonary:  Negative for all symptoms reviewed.   Cardiovascular:  Negative for all symptoms reviewed.   GI:  Negative for all symptoms reviewed.   GU:  Negative for all symptoms reviewed.   Musculoskeletal:  Negative for all symptoms reviewed.   Neuro:  Negative for all symptoms reviewed.  Heme/Lymph:  Negative for all symptoms reviewed.   Psych:  Negative for all symptoms reviewed.   Skin:  Negative for all symptoms reviewed.   Ophtho:  Negative for all symptoms reviewed.   Allergy/Immuno:  Negative for all symptoms reviewed.      Physical Exam      (8+)   Blood pressure  122/74, pulse (!) 59, temperature 99 F (37.2 C), temperature source Tympanic, resp. rate 16, height 1.651 m (5\' 5" ), weight 60.3 kg (133 lb), last menstrual period 02/24/2019, SpO2 95 %.   General: appears uncomfortable due to pain  HEENT(2-eye/+): normocephalic, atraumatic  Neck(0): supple  Chest: unlabored respirations, no accessory muscle use  Cardiac: regular rate and rhythm  Abdomen: softly distended, tender to palpation, no peritonitis, multiple well healed incisions  GU/Pelvis(0): normal  Extremities: no edema and no tenderness or deformities noted  Neuro: alert, speech normal, oriented x 3, no gross focal neurologic deficits, moves all extremities well  Psych: normal affect, insight good  Back(0): inspection of back is normal  Skin: no jaundice, no rashes    Labs:    Lab results have been reviewed as follows:  OSH Labs:  Na 144, K 3.4, Cl 111, bicarb 23, BUN 4, Cr 0.73, gluc 79  OSH Labs:  WBC 11.4, Hb 14.5, Hct 43, plt 252    Radiology:   Xr Abdomen 2 View With Cxr    Result Date: 03/11/2019  Gaseous distention of small bowel loops measuring up to 6.9 cm, slightly worsened since the recent CT study of the abdomen. Findings are likely related to worsening small bowel obstruction. ReadingStation:[id number]    Benadryl 25mg  IV x1, morphine 4mg  IV x2, zofran IV x1, IVF bolus x1,

## 2019-03-12 NOTE — Progress Notes (Addendum)
ACCESS DAILY PROGRESS NOTE    Name:  [name], [name]     DOB:  Nov 30, 1985   MR#:  [id number]               ROOM: 475/475-A    DATE:  03/12/19      Principal Diagnosis:  SBO (small bowel obstruction)    Refer to below for diagnoses being addressed for this encounter    ASSESSMENT & PLAN:                                                               Hospital Day: 2      CONDITION:  unchanged    Patient Active Hospital Problem List:   SBO (small bowel obstruction) (12/05/2018)    Assessment: has no transition point on CT, confirmed on SBFT today.  She has very slow transit and atony of distal bowel - not a surgically correctable issue.  Pain characterisitics are not classic for bowel obstruction and that they are fairly constant as opposed to colicky.  She is not having any vomiting though claimed obstipation - claims to have had good BM this am.  Her primary interest seems to be pain control and management of analgesics.    Plan: Have adjusted pain medications.  We will allow diet tonight - should eat small frequent meals with little roughage. Needs to be seen and followed by a gastroenterologist. SHOULD NOT BE ADMITTED TO SURGERY FOR THIS DYSMOTILITY PROBLEM IN THE FUTURE.   Chronic hepatitis C (05/03/2017)    Assessment: No active issues    Plan: No treatment   Bipolar 2 disorder, major depressive episode (10/22/2018)    Assessment: May contribute to enhancement of symptoms and suboptimal pain control    Plan: Continue regular medication   Gastroesophageal reflux disease (03/11/2019)    Assessment: Quiescent    Plan: Pepcid      Arther Abbott, MD    X [phone]  [facility] ACCESS Program    Incidental findings:  none  Additional Info:  Care plan and expectations reviewed with Patient  Questions were answered.  Discussed with bedside RN  I have personally reviewed the images for This admission.    Subjective/Chief Complaint & ROS:   Abdominal pain better.  + BMs.  She denies emesis.   She is taking small amount of  liquid. Claims normal urination.    24-hour interval history:  SBFT today    Meds:   [START ON 03/13/2019] clonazePAM, 0.5 mg, Oral, BID  enoxaparin, 40 mg, Subcutaneous, Q24H  ketorolac, 15 mg, Intravenous, Q6H  sodium chloride (PF), 3 mL, Intravenous, Q8H        Infusions:   lactated ringers 125 mL/hr at 03/12/19 0101        DVT Prophylaxis:  enoxaparin 40 mg SQ Daily  Foley:  No          GI Prophylaxis:  Yes      BM last 24 Hours:  No   Bowel Regimen:  No    Diet:  Diet NPO effective now - Surgery/Procedure; Sips with Meds allowed    I & O's:  Data reviewed   Pain: poorly controlled  EXAM:   Vitals:  Blood pressure 117/61, pulse (!) 58, temperature 98.2 F (36.8 C), temperature  source Temporal, resp. rate 17, height 1.651 m (5\' 5" ), weight 60.3 kg (133 lb), last menstrual period 02/24/2019, SpO2 97 %.   General:   appears well-hydrated, non-toxic  Pulmonary:   lungs clear to ausculation  Abdomen:   soft, mild generalized tenderness without rebound and with guarding, mildly distended  Psych:   normal affect, insight fair    Lab Data Reviewed:  Yes - K+ up to 3.2     Cultures:  N/A     CHEM:   Recent Labs   Lab 03/12/19  0507 03/11/19  1432 03/09/19  1434 03/09/19  0719 03/08/19  0331 03/07/19  2014   Glucose 94 79  --  95 95 103*   Sodium 142 144  --  141 141 138   Potassium 2.9* 3.4*  --  3.5 3.6 3.4*   Chloride 110 111*  --  115* 114* 108   CO2 24 23.0  --  20 22 21.0   BUN 4* 4*  --  9 6* 8   Creatinine 0.73 0.73  --  0.73 0.71 0.83   Calcium 8.2* 8.1*  --  8.0* 7.9* 8.6   Magnesium  --  1.6 1.7 1.8 1.6  --    Phosphorus  --   --   --  1.7*  --   --      CBC:     Recent Labs   Lab 03/12/19  0507 03/11/19  1432 03/08/19  0331 03/07/19  2014   WBC 9.7 11.4* 9.1 14.8*   Hemoglobin 13.8 14.5 12.7 13.8   Hematocrit 40.6 43.0 36.8 42.3   PLT CT 252 252 225 258   MCV 100 98 101* 98     BANDS:    Recent Labs   Lab 03/11/19  1432   Bands 1     POCT:      LFTs:    Recent Labs   Lab 03/11/19  1432 03/07/19  2014   ALT 20  12   AST (SGOT) 24 13   Alkaline Phosphatase 87 94   Bilirubin, Total 0.3 0.5   Albumin 3.5 4.0     COAG:      Lactate:    Recent Labs   Lab 03/08/19  1258   Lactic Acid 0.6       Radiology:    No results found.

## 2019-03-12 NOTE — Plan of Care (Addendum)
NURSE NOTE SUMMARY  [facility] - 4TH SURGICAL   Patient Name: [name],[name]   Attending Physician: Arther Abbott, MD   Today's date:   03/12/2019 LOS: 1 days   Shift Summary:                                                              Pt arrived to room at 1700.    Treated pain of 9-10 with 3 mg Morphine. Treated nausea with 4 mg Zofran IV.     LR continues to infuse at 125 ml.     Due to history of seizures, seizure pads were ordered.  Seizure precautions in place.     Provider Notifications:        Rapid Response Notifications:  Mobility:            Weight tracking:  Family Dynamic:   Last 3 Weights for the past 72 hrs (Last 3 readings):   Weight   03/11/19 1812 60.3 kg (133 lb)             Last Bowel Movement   Last BM Date: 03/09/19

## 2019-03-12 NOTE — ED Notes (Signed)
[facility] EMERGENCY DEPARTMENT  ED NURSING NOTE FOR THE RECEIVING INPATIENT NURSE   ED NURSE PHONE: [phone]    ADMISSION INFORMATION   [name] is a 35 y.o. female admitted with an ED diagnosis of:  1. SBO (small bowel obstruction)      ED Pre-Departure Assessment:  PT is alert and oriented x 4. She is ambulatory but has been in a lot of pain and requesting pain medication every 2 hours. She was last medicated around 1430. She has LR infusing. She had a low K and was given 6 bags of . She is cooperative and pleasant.    NURSING CARE   Isolation {None   Home O2 None   Patient Comes From: Home/Indepedent   Mental Status: Alert and Oriented   Ambulation: Ambulatory   Pertinent Info/Safety Concern: She is a slight safey concerns due to the morphine she has been getting every 2 hours per request from patient.

## 2019-03-13 ENCOUNTER — Encounter: Payer: Self-pay | Admitting: Surgery

## 2019-03-13 ENCOUNTER — Inpatient Hospital Stay: Payer: Medicaid Other

## 2019-03-13 LAB — BASIC METABOLIC PANEL
Anion Gap: 15.3 mMol/L (ref 7.0–18.0)
BUN / Creatinine Ratio: 7.2 Ratio — ABNORMAL LOW (ref 10.0–30.0)
BUN: 5 mg/dL — ABNORMAL LOW (ref 7–22)
CO2: 19.9 mMol/L — ABNORMAL LOW (ref 20.0–30.0)
Calcium: 8.8 mg/dL (ref 8.5–10.5)
Chloride: 108 mMol/L (ref 98–110)
Creatinine: 0.69 mg/dL (ref 0.60–1.20)
EGFR: 116 mL/min/{1.73_m2} (ref 60–150)
Glucose: 88 mg/dL (ref 71–99)
Osmolality Calculated: 276 mOsm/kg (ref 275–300)
Potassium: 3.2 mMol/L — ABNORMAL LOW (ref 3.5–5.3)
Sodium: 140 mMol/L (ref 136–147)

## 2019-03-13 MED ORDER — TIZANIDINE HCL 4 MG PO TABS
8.0000 mg | ORAL_TABLET | Freq: Three times a day (TID) | ORAL | Status: DC | PRN
Start: 2019-03-13 — End: 2019-03-14
  Administered 2019-03-13 – 2019-03-14 (×2): 8 mg via ORAL
  Filled 2019-03-13: qty 2
  Filled 2019-03-13: qty 6
  Filled 2019-03-13: qty 2

## 2019-03-13 MED ORDER — TRAZODONE HCL 100 MG PO TABS
300.0000 mg | ORAL_TABLET | Freq: Every evening | ORAL | Status: DC
Start: 2019-03-13 — End: 2019-03-14
  Administered 2019-03-13: 21:00:00 300 mg via ORAL
  Filled 2019-03-13 (×4): qty 3

## 2019-03-13 MED ORDER — POTASSIUM CHLORIDE 20 MEQ/15ML (10%) PO SOLN
40.00 meq | Freq: Once | ORAL | Status: AC
Start: 2019-03-13 — End: 2019-03-13
  Administered 2019-03-13: 20:00:00 40 meq via ORAL
  Filled 2019-03-13 (×2): qty 30

## 2019-03-13 MED ORDER — PREGABALIN 150 MG PO CAPS
150.00 mg | ORAL_CAPSULE | Freq: Three times a day (TID) | ORAL | Status: DC
Start: 2019-03-13 — End: 2019-03-14
  Administered 2019-03-13 – 2019-03-14 (×3): 150 mg via ORAL
  Filled 2019-03-13 (×3): qty 1

## 2019-03-13 MED ORDER — ACETAMINOPHEN 10 MG/ML IV SOLN
1000.00 mg | Freq: Four times a day (QID) | INTRAVENOUS | Status: DC
Start: 2019-03-13 — End: 2019-03-14
  Administered 2019-03-13 – 2019-03-14 (×4): 1000 mg via INTRAVENOUS
  Filled 2019-03-13 (×4): qty 100

## 2019-03-13 MED ORDER — IOHEXOL 240 MG/ML IJ SOLN
100.00 mL | Freq: Once | INTRAMUSCULAR | Status: AC | PRN
Start: 2019-03-13 — End: 2019-03-13
  Administered 2019-03-13: 100 mL via INTRAVENOUS

## 2019-03-13 NOTE — Plan of Care (Signed)
Problem: Safety  Goal: Patient will be free from injury during hospitalization  Outcome: Progressing  Goal: Patient will be free from infection during hospitalization  Outcome: Progressing     Problem: Pain  Goal: Pain at adequate level as identified by patient  Outcome: Progressing     Problem: Side Effects from Pain Analgesia  Goal: Patient will experience minimal side effects of analgesic therapy  Outcome: Progressing     Problem: Discharge Barriers  Goal: Patient will be discharged home or other facility with appropriate resources  Outcome: Progressing     Problem: Psychosocial and Spiritual Needs  Goal: Demonstrates ability to cope with hospitalization/illness  Outcome: Progressing     Problem: Moderate/High Fall Risk Score >5  Goal: Patient will remain free of falls  Outcome: Progressing

## 2019-03-13 NOTE — Progress Notes (Signed)
MOBILITY NOTE:     Precautions: Weight Bearing: Full weight bearing     Activity:  Ambulated in the hall x465 feet     Level of Assistance: A little     Device(s) used: No device     Positioning frequency: Turns self as needed    Last Vitals: BP 121/73    Pulse 68    Temp 98.1 F (36.7 C) (Temporal)    Resp 15    Ht 1.651 m (5\' 5" )    Wt 60.3 kg (133 lb)    LMP 02/24/2019    SpO2 96%    BMI 22.13 kg/m     Patient is back in room needs are in reach nurse is aware

## 2019-03-13 NOTE — Progress Notes (Signed)
Quick Doc  [facility] - 4TH SURGICAL   Patient Name: [name],[name]   Attending Physician: Arther Abbott, MD   Today's date:   03/13/2019 LOS: 2 days   Expected Discharge Date      Quick  Assessment:                                                              ReAdmit Risk Score: 13    CM Comments: 03/13/19 SW RJ: Pt readmit with SBO. Planning for SBFT. C/o pain multiple times overnight. Pt independent PTA. Lives with spouse and children, spouse provides transport. Planning to return home when medically stable. Do not anticipate CM needs at this time. Please consult CM if needs arise. SW to follow.    Physical Discharge Disposition: (P) Home                                                                          Physical Discharge Disposition: (P) Home       Provider Notifications:             Harolyn Rutherford, MSW  Social Worker  [phone]

## 2019-03-13 NOTE — Progress Notes (Signed)
Currently inpatient. 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

## 2019-03-13 NOTE — Nursing Progress Note (Signed)
NURSE NOTE SUMMARY  [facility] - 4TH SURGICAL   Patient Name: [name],[name]   Attending Physician: Arther Abbott, MD   Today's date:   03/13/2019 LOS: 2 days   Shift Summary:                                                              1610:  Patient resting at present.  Medicated 6 times this shift for complaints of pain with little results.  Call light and personal items in reach.     Provider Notifications:        Rapid Response Notifications:  Mobility:          Weight tracking:  Family Dynamic:   Last 3 Weights for the past 72 hrs (Last 3 readings):   Weight   03/11/19 1812 60.3 kg (133 lb)             Last Bowel Movement   Last BM Date: 03/12/19

## 2019-03-13 NOTE — Plan of Care (Addendum)
NURSE NOTE SUMMARY  [facility] - 4TH SURGICAL   Patient Name: [name],[name]   Attending Physician: Arther Abbott, MD   Today's date:   03/13/2019 LOS: 2 days   Shift Summary:                                                                Assumed care of pt from night shift nurse.   Pt refuses Lovenox, states she never takes it when she is in the hospital and she moves enough in the room. Pt educated on risks of of blood clots.  States she does not want any injections in her stomach.     Pt transferred to xray in the am for upper GI.    Pt returned to room, xray to bring pt back downstairs for further images later today.     Pt treated with 3 mg IV Morphine and IV acetaminophen for pain   Xray in room performing additional images at 1302, remains a possibility of transfer downstairs for additional images later today. TBD.     Arrived to room to administer toradol.  Pt had turned off fluids and clamped her IV.  States she did not mean to turn off fluids.          Provider Notifications:        Rapid Response Notifications:  Mobility:            Weight tracking:  Family Dynamic:   Last 3 Weights for the past 72 hrs (Last 3 readings):   Weight   03/11/19 1812 60.3 kg (133 lb)             Last Bowel Movement   Last BM Date: 03/12/19

## 2019-03-14 LAB — BASIC METABOLIC PANEL
Anion Gap: 10.7 mMol/L (ref 7.0–18.0)
BUN / Creatinine Ratio: 10.3 Ratio (ref 10.0–30.0)
BUN: 7 mg/dL (ref 7–22)
CO2: 23 mMol/L (ref 20–30)
Calcium: 8.3 mg/dL — ABNORMAL LOW (ref 8.5–10.5)
Chloride: 109 mMol/L (ref 98–110)
Creatinine: 0.68 mg/dL (ref 0.60–1.20)
EGFR: 116 mL/min/{1.73_m2} (ref 60–150)
Glucose: 106 mg/dL — ABNORMAL HIGH (ref 71–99)
Osmolality Calculated: 276 mOsm/kg (ref 275–300)
Potassium: 3.7 mMol/L (ref 3.5–5.3)
Sodium: 139 mMol/L (ref 136–147)

## 2019-03-14 NOTE — Progress Notes (Addendum)
NURSE NOTE SUMMARY  [facility] [place] - 4TH SURGICAL   Patient Name: [name]   Attending Physician: Jimmy Footman [facility]*   Today's date:   03/14/2019 LOS: 3 days   Shift Summary:                                                              9:03 AM  Assumed care of pt around 0715. Pt A&Ox4. Assessment completed. RFA c/d/i, infusing LR @ 30. Pt c/o 9/10 pain, IV morphine given. No needs at this time. Will continue to monitor.   10:26 AM  Fluids d/c'd per order.   12:55 PM  Pt given discharge instructions. Pt denies having any questions and verbalized understanding. Notified of follow-up appointments. PIV removed. Awaiting transport at 1300.    Provider Notifications:        Rapid Response Notifications:  Mobility:      PMP Activity: Step 6 - Walks in Room (03/14/2019  8:20 AM)     Weight tracking:  Family Dynamic:   Last 3 Weights for the past 72 hrs (Last 3 readings):   Weight   03/11/19 1812 60.3 kg (133 lb)             Last Bowel Movement   Last BM Date: 03/13/19

## 2019-03-14 NOTE — Discharge Summary (Signed)
DISCHARGE SUMMARY - ACCESS   Name:  [name], [name]     DOB:  08-25-1985     MR#:  [id number]         Admit Date:  03/11/2019 TO D/C Date: 03/14/19 [facility] Day: 4 )    Admit to:  [facility]  Discharge from:  [facility]     Discharge Diagnoses:     Active Hospital Problems    Diagnosis POA    Principal Problem: SBO (small bowel obstruction) Yes    Gastroesophageal reflux disease Yes    Small bowel obstruction due to adhesions Yes    Bipolar 2 disorder, major depressive episode Yes    Human papilloma virus infection Yes    Chronic hepatitis C Yes      Resolved Hospital Problems   No resolved problems to display.        PMH:   has a past medical history of Anxiety, Bipolar 1 disorder, Chronic abdominal pain, Chronic obstructive pulmonary disease, Fibromyalgia, Gastroesophageal reflux disease, Insomnia, Seasonal allergic rhinitis, and Small bowel obstruction.     Brief Presentation History:  Refer to admission H&P for complete admission details.    34 y.o. 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  She was admitted recently and had seemingly resolved and was discharged home.  After discharge her abdominal pain returned as did her distention and obstipation.  This worsened throughout the night to today and led to her presentation.  Has nausea, vomiting, denies fevers and chills.  No other new complaints since yesterday.  Has prior discussion for elective adhesiolysis but was unable to schedule that at her local hospital.    Initial evaluation included ACCESS to determine the diagnosis of distended distal bowel/colon.       Hospital Course:    [name] was admitted after previous admission with distended distal bowel/colon. SBFT slow transit and atony of distal bowel, this was not found to be a surgical issue. She was  weaned off pain medication and discharged home with GI follow up.     Operative and Other Procedures:   None    Cultures/Pathology:       Consultations:  None    Condition:  Stable    Discharge Meds:     Current Discharge Medication List      CONTINUE these medications which have NOT CHANGED    Details   acetaminophen-codeine (TYLENOL #3) 300-30 MG per tablet Take 1 tablet by mouth 3 (three) times daily         albuterol sulfate HFA (PROVENTIL) 108 (90 Base) MCG/ACT inhaler Inhale 2 puffs into the lungs every 4 (four) hours as needed for Wheezing or Shortness of Breath  Qty: 1 Inhaler, Refills: 2    Associated Diagnoses: Simple chronic bronchitis      clonazePAM (KlonoPIN) 0.5 MG tablet Take 0.5 mg by mouth daily as needed for Anxiety      diphenoxylate-atropine (Lomotil) 2.5-0.025 MG per tablet Take 2 tablets by mouth 4 (four) times daily  Qty: 240 tablet, Refills: 2    Associated Diagnoses: Short gut syndrome      fluticasone-salmeterol (ADVAIR HFA) 230-21 MCG/ACT inhaler Inhale 2 puffs into the lungs 2 (two) times daily  Qty: 12 g, Refills: 2    Associated Diagnoses: Simple chronic bronchitis      ondansetron (Zofran ODT) 4 MG disintegrating tablet Take 1  tablet (4 mg total) by mouth every 8 (eight) hours as needed for Nausea  Qty: 30 tablet, Refills: 0    Associated Diagnoses: Nausea      pregabalin (LYRICA) 100 MG capsule Take 1 capsule (100 mg total) by mouth 2 (two) times daily  Qty: 60 capsule, Refills: 1    Associated Diagnoses: Fibromyalgia      tiZANidine (ZANAFLEX) 4 MG tablet Take 2 tablets (8 mg total) by mouth every 8 (eight) hours as needed (fibromyalgia pain)  Qty: 30 tablet, Refills: 0    Associated Diagnoses: Fibromyalgia      topiramate (TOPAMAX) 25 MG tablet Take 25 mg by mouth 2 (two) times daily      traZODone (DESYREL) 300 MG tablet Take 300 mg by mouth nightly      venlafaxine (EFFEXOR-XR) 150 MG 24 hr capsule Take 150 mg by mouth every morning      naloxone (NARCAN) 4 MG/0.1ML nasal spray 1  spray intranasally. If pt does not respond or relapses into respiratory depression call 911. Give additional doses every 2-3 min.  Qty: 2 each, Refills: 0    Comments: Dispense #1 twin pack             Follow-up:   [facility] no follow up .   GI - 2 weeks    Future Appointments   Date Time Provider Department Center   03/18/2019 11:00 AM Kathi Simpers, NP [facility]       Patient Instructions:                              Diet:  Low residue diet indefinitely.    Activity:  As tolerated.    Medications:  Refer to the After Visit Summary for updates and new medications.  Take over-the-counter  Tylenol (aka acetaminophen) as needed for mild to moderate pain or headache.  Use over-the-counter stool softeners and/or Senokot daily to twice daily as needed for constipation.    Wound/Drain Care:  None    Instructions:  Call/Return if temperature over 101.0, refractory nausea/vomiting, increased pain not relieved by medications, or new onset of chest pain or shortness or breath.    For additional questions or concerns after your discharge, you may contact the ACCESS office at [phone] between the hours of 8:00am - 4:30pm.  Any medication refill requests should be called to the office during daytime business hours.  If you have an urgent need that occurs after office hours, call [phone] and ask to speak to the ACCESS physician on call.  No medication refills will be handled outside of business hours.  Emergencies should go to the Emergency Room and cannot be handled over the phone.    Doreene Burke, NP   [facility] ACCESS Program     The time spent to complete this discharge and involving patient care today took less than 30 minutes.

## 2019-03-14 NOTE — Discharge Instr - AVS First Page (Signed)
Diet:  Low residue diet indefinitely.    Activity:  As tolerated.    Medications:  Refer to the After Visit Summary for updates and new medications.  Take over-the-counter  Tylenol (aka acetaminophen) as needed for mild to moderate pain or headache.  Use over-the-counter stool softeners and/or Senokot daily to twice daily as needed for constipation.    Wound/Drain Care:  None    Instructions:  Call/Return if temperature over 101.0, refractory nausea/vomiting, increased pain not relieved by medications, or new onset of chest pain or shortness or breath.    For additional questions or concerns after your discharge, you may contact the ACCESS office at [phone] between the hours of 8:00am - 4:30pm.  Any medication refill requests should be called to the office during daytime business hours.  If you have an urgent need that occurs after office hours, call [phone] and ask to speak to the ACCESS physician on call.  No medication refills will be handled outside of business hours.  Emergencies should go to the Emergency Room and cannot be handled over the phone.    ACCESS BOWEL MAINTENANCE GUIDELINES    The use of narcotic pain medications and decreased activity will lead to constipation.  In order to prevent this from happening to you, please follow these recommendations:  1. Drink adequate amounts of fluid throughout the day.    2. Take an over-the-counter stool softener, such as Colace, once or twice a day depending on the firmness of your stools.  3. You may take another over-the-counter stool softener, such as Senokot or generic Senna, two tablets at bedtime.  4. Add Metamucil or other similar fiber supplement to your diet twice a day.    If constipation persists:   1. Add Milk of Magnesia two (2) tablespoons (30mL) every 6    hours (up to 8 doses).  2. If you are experiencing rectal fullness or pressure, try a Dulcolax and/or   glycerin suppository every 12 hours.     Avoid anti-diarrhea medications once  your bowels begin to move.  Loose         stools should resolve on their own after 2-3 days.   Gas relief medications such as Gas-X or Gaviscon are acceptable for use.     Probiotic food products, such as yogurt, are acceptable especially if you are or have been on antibiotics.    Additional Recipe for Success (Natural Stool Softener):    -1 cup of unsweetened applesauce,   - cup of unsweetened prune juice,   -1 cup of unprocessed bran (ex., Millers brand - can be found at  [employer], [place],        [place], or other health food stores).    Mix together and store in refrigerator.    Take 2 tablespoons with a large glass of water daily and expect results 6-8 hours later.  You may add 1 tablespoon every 5-7 days if needed.   *can be used as long as desired, non-addictive, & gentle

## 2019-03-14 NOTE — Nursing Progress Note (Signed)
NURSE NOTE SUMMARY  [facility] - 4TH SURGICAL   Patient Name: [name],[name]   Attending Physician: Arther Abbott, MD   Today's date:   03/14/2019 LOS: 3 days   Shift Summary:                                                              1610:  Patient resting at present.  Medicated three times this shift for complaints of pain with little results.  Call light and personal items in reach.     Provider Notifications:        Rapid Response Notifications:  Mobility:          Weight tracking:  Family Dynamic:   Last 3 Weights for the past 72 hrs (Last 3 readings):   Weight   03/11/19 1812 60.3 kg (133 lb)             Last Bowel Movement   Last BM Date: 03/13/19

## 2019-03-14 NOTE — Progress Notes (Signed)
Quick Doc  [facility] - 4TH SURGICAL   Patient Name: [name]   Attending Physician: Jimmy Footman [facility]*   Today's date:   03/14/2019 LOS: 3 days   Expected Discharge Date      Quick  Assessment:                                                              ReAdmit Risk Score: 16    CM Comments: (P) 03/14/19 SW RJ: Pt scheduled to [facility] [place] [facility] today at 1300 via Medicaid transport ([employer]). Trip #[id number].    Physical Discharge Disposition: Home                                   Mode of Transportation: [facility])     Patient/Family/POA notified of transfer plan: Patient informed only  Patient agreeable to discharge plan/expected d/c date?: Yes     Bedside nurse notified of transport plan?: Yes                       Physical Discharge Disposition: Home       Provider Notifications:                 Harolyn Rutherford, MSW  Social Worker  [phone]

## 2019-03-17 NOTE — UM Notes (Signed)
UTILIZATION REVIEW DISCHARGE NOTE      [name],[name]  03-Jul-1985      AUTH# [id number]    TIME/DATE OF DISCHARGE: 03/14/2019  3:42 PM      DISPOSITION: HOME      Trevor Iha, RN, BSN  Utilization Management  [facility]  [address]  [place], [place] [address]  Work: [phone]  Fax: [phone]  [email or web address]

## 2019-03-18 ENCOUNTER — Encounter (RURAL_HEALTH_CENTER): Payer: Self-pay | Admitting: Nurse Practitioner

## 2019-03-18 ENCOUNTER — Ambulatory Visit: Payer: Medicaid Other | Attending: Nurse Practitioner | Admitting: Nurse Practitioner

## 2019-03-18 VITALS — BP 90/59 | HR 123 | Wt 132.6 lb

## 2019-03-18 DIAGNOSIS — K56609 Unspecified intestinal obstruction, unspecified as to partial versus complete obstruction: Secondary | ICD-10-CM

## 2019-03-18 DIAGNOSIS — B001 Herpesviral vesicular dermatitis: Secondary | ICD-10-CM

## 2019-03-18 DIAGNOSIS — R05 Cough: Secondary | ICD-10-CM

## 2019-03-18 DIAGNOSIS — J029 Acute pharyngitis, unspecified: Secondary | ICD-10-CM

## 2019-03-18 DIAGNOSIS — R059 Cough, unspecified: Secondary | ICD-10-CM

## 2019-03-18 NOTE — Progress Notes (Signed)
Patient Name: [name]    Subjective   History of Present Illness:   [name] is a 34 y.o. female who presents with:  Chief Complaint   Patient presents with    Cough    Sore Throat    Chest congestion    Nasal Congestion    Fever Blister     This visit was conducted with the use of interactive audio / video telecommunication that permitted real time communication between [employer] and myself. She consented to participation and received services through videoconferencing while I was located at [facility]    Sick Symptoms:  Patient reports she has a bad sore throat and a cold sore on the side of her mouth that is painful. She reports her throat is very raw. It hurts her to smoke and drink soda. She has also been coughing and has congestion. She is unaware of anyone else being sick around her. These symptoms have been present for 3 days now.     Hospital Follow-Up:  Patient was instructed to follow up with GI two weeks after discharge and she has three appointments set up with GI. On Thursday, 03/20/2019, she has a GI appointment in [place]. On 03/25/2019 she has a GI appointment in [place]. She also has a GI appointment June 8th at [facility]. She reports she was a little bit constipated yesterday (03/17/2019) but she took some stool softeners to help relieve symptoms. She reports they are discussing surgery options as well. She has had 2 ostomy's in the past. She reports prior to going to the hospital she was in extreme pain.        Problem List:     Patient Active Problem List   Diagnosis    PCOS (polycystic ovarian syndrome)    Bipolar disorder    Chronic hepatitis C    Crohn's disease    Fibromyalgia    Grand mal status    Human papilloma virus infection    Insomnia    Encounter for long-term (current) use of high-risk medication    Bipolar 2 disorder, major depressive episode    GAD (generalized anxiety disorder)    SBO (small  bowel obstruction)    Bipolar disorder, in partial remission, most recent episode mixed    Moderate episode of recurrent major depressive disorder    Disorder of intestine    Fatigue    Gastroesophageal reflux disease    Migraine    Near syncope    Palpitations    Seizure    Small bowel obstruction due to adhesions          Medications:     Prior to Admission medications    Medication Sig Start Date End Date Taking? Authorizing Provider   acetaminophen-codeine (TYLENOL #3) 300-30 MG per tablet Take 1 tablet by mouth 3 (three) times daily    03/07/19  Yes [provider]   albuterol sulfate HFA (PROVENTIL) 108 (90 Base) MCG/ACT inhaler Inhale 2 puffs into the lungs every 4 (four) hours as needed for Wheezing or Shortness of Breath 12/13/18 12/13/19 Yes Hallee Mckenny T, NP   clonazePAM (KlonoPIN) 0.5 MG tablet Take 0.5 mg by mouth daily as needed for Anxiety   Yes [provider]   diphenoxylate-atropine (Lomotil) 2.5-0.025 MG per tablet Take 2 tablets by mouth 4 (four) times daily 03/04/19 03/03/20 Yes Ieasha Boerema T, NP   fluticasone-salmeterol (ADVAIR HFA) 230-21 MCG/ACT inhaler Inhale 2 puffs into the lungs 2 (two)  times daily 12/13/18  Yes Valin Massie T, NP   naloxone (NARCAN) 4 MG/0.1ML nasal spray 1 spray intranasally. If pt does not respond or relapses into respiratory depression call 911. Give additional doses every 2-3 min. 03/10/19  Yes Arther Abbott, MD   ondansetron (Zofran ODT) 4 MG disintegrating tablet Take 1 tablet (4 mg total) by mouth every 8 (eight) hours as needed for Nausea 03/06/19  Yes Briseidy Spark T, NP   pregabalin (LYRICA) 100 MG capsule Take 1 capsule (100 mg total) by mouth 2 (two) times daily 11/15/18 11/15/19 Yes Amity Roes T, NP   tiZANidine (ZANAFLEX) 4 MG tablet Take 2 tablets (8 mg total) by mouth every 8 (eight) hours as needed (fibromyalgia pain) 02/05/19  Yes Deviers, Richarda Osmond, NP   topiramate (TOPAMAX) 25 MG tablet Take 25 mg by mouth 2 (two) times  daily   Yes [provider]   traZODone (DESYREL) 300 MG tablet Take 300 mg by mouth nightly   Yes [provider]   venlafaxine (EFFEXOR-XR) 150 MG 24 hr capsule Take 150 mg by mouth every morning   Yes [provider]        Review of Systems:   14 system review negative except as noted in the HPI.    Physical Exam:      Vitals:    03/18/19 1022   BP: 90/59   Pulse: (!) 123   SpO2: 98%   Weight: 60.1 kg (132 lb 9.6 oz)       General:  no acute distress. Pleasant and well groomed.  Neck: no JVD, no tracheal deviation  Lungs: Effort normal, no use accessory muscles.  Neuro:  alert and oriented.   Skin: no rashes or lesions noted  Psychiatric/Behavioral: appropriate affect, mood and behavior.       Assessment:     1. Small bowel obstruction    2. Sore throat    3. Cough        Plan:     Orders Placed This Encounter   Procedures    SARS-CoV-2 Assay ([employer] System(TM))     Standing Status:   Future     Standing Expiration Date:   03/17/2020     Order Specific Question:   Specimen     Answer:   Nasal Swab COVID-19     Order Specific Question:   Does patient have symptoms related to condition of interest?     Answer:   Y     Order Specific Question:   Symptom Date of Onset if Known     Answer:   Date of Onset Known     Order Specific Question:   Date of Onset     Answer:   03/15/2019     Order Specific Question:   Does patient reside in congregate care setting?     Answer:   N     Order Specific Question:   Is patient employed in a healthcare setting?     Answer:   N     Order Specific Question:   Is the patient pregnant?     Answer:   N    [facility] SARS Coronavirus Antigen [facility] POCT     Order Specific Question:   Specimen     Answer:   Nasal Swab COVID-19     Order Specific Question:   First Test?     Answer:   Yes     Order Specific Question:   Does patient  have symptoms related to condition of interest?     Answer:   Y     Order Specific Question:   Symptom Date of Onset if Known      Answer:   Date of Onset Known     Order Specific Question:   Date of Onset     Answer:   03/15/2019     Order Specific Question:   Does patient reside in congregate care setting?     Answer:   N     Order Specific Question:   Is patient employed in a healthcare setting?     Answer:   N     Order Specific Question:   Is the patient hospitalized because of this condition?     Answer:   N     Order Specific Question:   Is patient admitted to the intensive care unit?     Answer:   N     Order Specific Question:   Is the patient pregnant?     Answer:   N    POCT Rapid strep A       Requested Prescriptions      No prescriptions requested or ordered in this encounter       Patient Instructions   We make every attempt to respond to your calls and emails within 72 hours.  Please note that this is only during business hours.  If you have a question or a problem that needs attention sooner, and it is after hours please call the physician on-call at [phone], these services begin after 5PM on weekdays.  If you have an emergency, call 911.     Recommend staying very hydrated!    Recommend trying Delsym cough medicine.      Recommend using Vaseline on fever blister to reduce cracking.    Please schedule an appointment at the [facility] clinic as soon as possible to receive a COVID-19 test.        No follow-ups on file.    Signed by: Kathi Simpers, NP     Supervising Doctor: Rogelio Seen, MD    By signing my name below, I, Phill Myron, attest that this documentation has been prepared under the direction and in the presence of Hisako Bugh, FNP-C.  Electronically Signed: Phill Myron, Scribe.     I, Marja Kays, FNP-C, personally performed the services described in this documentation. All medical record entries made by the scribe were at my direction and in my presence. I have reviewed the chart and plan instructions and agree that the record reflects my personal performance and is accurate and complete.

## 2019-03-18 NOTE — Patient Instructions (Addendum)
We make every attempt to respond to your calls and emails within 72 hours.  Please note that this is only during business hours.  If you have a question or a problem that needs attention sooner, and it is after hours please call the physician on-call at [phone], these services begin after 5PM on weekdays.  If you have an emergency, call 911.     Recommend staying very hydrated!    Recommend trying Delsym cough medicine.      Recommend using Vaseline on fever blister to reduce cracking.    Please schedule an appointment at the [facility] clinic as soon as possible to receive a COVID-19 test.

## 2019-03-20 ENCOUNTER — Ambulatory Visit (RURAL_HEALTH_CENTER): Payer: Medicaid Other

## 2019-03-21 ENCOUNTER — Other Ambulatory Visit (RURAL_HEALTH_CENTER): Payer: Self-pay

## 2019-03-21 DIAGNOSIS — M797 Fibromyalgia: Secondary | ICD-10-CM

## 2019-03-21 NOTE — Telephone Encounter (Signed)
Requested Prescriptions     Pending Prescriptions Disp Refills    tiZANidine (ZANAFLEX) 4 MG tablet 30 tablet 0     Sig: Take 2 tablets (8 mg total) by mouth every 8 (eight) hours as needed (fibromyalgia pain)         Days of supply: 30  Last labs: 03/14/19  Last appt: 03/18/19

## 2019-03-24 MED ORDER — TIZANIDINE HCL 4 MG PO TABS
8.0000 mg | ORAL_TABLET | Freq: Three times a day (TID) | ORAL | 2 refills | Status: DC | PRN
Start: 2019-03-24 — End: 2019-04-01

## 2019-04-01 ENCOUNTER — Encounter (RURAL_HEALTH_CENTER): Payer: Self-pay | Admitting: Nurse Practitioner

## 2019-04-01 ENCOUNTER — Ambulatory Visit: Payer: Medicare Managed Care Other | Attending: Nurse Practitioner | Admitting: Nurse Practitioner

## 2019-04-01 VITALS — Resp 16 | Ht 65.0 in | Wt 132.0 lb

## 2019-04-01 DIAGNOSIS — F17210 Nicotine dependence, cigarettes, uncomplicated: Secondary | ICD-10-CM

## 2019-04-01 DIAGNOSIS — J069 Acute upper respiratory infection, unspecified: Secondary | ICD-10-CM

## 2019-04-01 MED ORDER — GUAIFENESIN ER 600 MG PO TB12
1200.00 mg | ORAL_TABLET | Freq: Two times a day (BID) | ORAL | 0 refills | Status: DC
Start: 2019-04-01 — End: 2019-05-03

## 2019-04-01 MED ORDER — TIZANIDINE HCL 6 MG PO CAPS
6.00 mg | ORAL_CAPSULE | Freq: Three times a day (TID) | ORAL | 1 refills | Status: DC | PRN
Start: 2019-04-01 — End: 2019-05-03

## 2019-04-01 MED ORDER — DOXYCYCLINE HYCLATE 50 MG PO CAPS
100.00 mg | ORAL_CAPSULE | Freq: Two times a day (BID) | ORAL | 0 refills | Status: AC
Start: 2019-04-01 — End: 2019-04-08

## 2019-04-01 MED ORDER — CLINDAMYCIN HCL 150 MG PO CAPS
150.00 mg | ORAL_CAPSULE | Freq: Three times a day (TID) | ORAL | 0 refills | Status: DC
Start: 2019-04-01 — End: 2019-04-01

## 2019-04-01 NOTE — Progress Notes (Signed)
PROGRESS NOTE      Patient Name: [name]  Primary Care Physician: Kathi Simpers, NP      History of Presenting Illness:   [name] is a 34 y.o. female who presents to the office via telehealth with sore throat, nasal congestion and shortness of breath for 2 weeks.  Chief Complaint   Patient presents with    Sore Throat    Nasal Congestion    Shortness of Breath     URI  Presents for evaluation of symptoms of a URI. Symptoms include congestion, cough described as productive, nasal congestion and sore throat. Onset of symptoms was 2 weeks ago, and has been gradually worsening since that time. Treatment to date: none.  Past Medical History:     Past Medical History:   Diagnosis Date    Anxiety     Bipolar 1 disorder     Chronic abdominal pain     Chronic obstructive pulmonary disease     Fibromyalgia     Gastroesophageal reflux disease     Insomnia     Seasonal allergic rhinitis     Small bowel obstruction        Past Surgical History:     Past Surgical History:   Procedure Laterality Date    APPENDECTOMY      CHOLECYSTECTOMY      COLON RESECTION      COLON SURGERY      LAPAROTOMY, ABDOMINAL EXPLORATION      The patient has had multiple prior abdominal explorations, including colon resection, ileostomy's, and surgery for reported Crohn's disease.         Family History:     Family History   Problem Relation Age of Onset    Hypertension Mother     Diabetes Mother     Leukemia Father     Liver disease Maternal Grandfather     Hyperlipidemia Paternal Grandfather        Social History:     Social History     Tobacco Use   Smoking Status Current Every Day Smoker    Packs/day: 1.00    Types: Cigarettes   Smokeless Tobacco Never Used     Social History     Substance and Sexual Activity   Alcohol Use Never    Frequency: Never     Social History     Substance and Sexual Activity   Drug Use Not Currently    Comment: opiates -- last use 2013       Allergies:      Allergies   Allergen Reactions    Fentanyl Anaphylaxis    Reglan [Metoclopramide]     Rocephin [Ceftriaxone]     Tramadol     Vancomycin        Medications:     Prior to Admission medications    Medication Sig Start Date End Date Taking? Authorizing Provider   acetaminophen-codeine (TYLENOL #3) 300-30 MG per tablet Take 1 tablet by mouth 3 (three) times daily    03/07/19  Yes [provider]   albuterol sulfate HFA (PROVENTIL) 108 (90 Base) MCG/ACT inhaler Inhale 2 puffs into the lungs every 4 (four) hours as needed for Wheezing or Shortness of Breath 12/13/18 12/13/19 Yes Sharp, Skyler T, NP   clonazePAM (KlonoPIN) 0.5 MG tablet Take 0.5 mg by mouth daily as needed for Anxiety   Yes [provider]   diphenoxylate-atropine (Lomotil) 2.5-0.025 MG per tablet Take 2 tablets by mouth 4 (four)  times daily 03/04/19 03/03/20 Yes Marja Kays T, NP   fluticasone-salmeterol (ADVAIR HFA) 230-21 MCG/ACT inhaler Inhale 2 puffs into the lungs 2 (two) times daily 12/13/18  Yes Sharp, Skyler T, NP   naloxone (NARCAN) 4 MG/0.1ML nasal spray 1 spray intranasally. If pt does not respond or relapses into respiratory depression call 911. Give additional doses every 2-3 min. 03/10/19  Yes Arther Abbott, MD   ondansetron (Zofran ODT) 4 MG disintegrating tablet Take 1 tablet (4 mg total) by mouth every 8 (eight) hours as needed for Nausea 03/06/19  Yes Sharp, Skyler T, NP   pregabalin (LYRICA) 100 MG capsule Take 1 capsule (100 mg total) by mouth 2 (two) times daily 11/15/18 11/15/19 Yes Sharp, Skyler T, NP   tiZANidine (ZANAFLEX) 4 MG tablet Take 2 tablets (8 mg total) by mouth every 8 (eight) hours as needed (fibromyalgia pain) 03/24/19  Yes Sharp, Skyler T, NP   topiramate (TOPAMAX) 25 MG tablet Take 25 mg by mouth 2 (two) times daily   Yes [provider]   traZODone (DESYREL) 300 MG tablet Take 300 mg by mouth nightly   Yes [provider]   venlafaxine (EFFEXOR-XR) 150 MG 24 hr capsule Take 150  mg by mouth every morning   Yes [provider]       Review of Systems:      14 point review of systems was negative except for what was noted in the HPI.    Physical Exam:     Vitals:    04/01/19 0933   Resp: 16     Body mass index is 21.97 kg/m.    General:  no acute distress. Pleasant and well groomed.  Lungs: Effort normal, no use accessory muscles.  Neuro:  alert and oriented.   Psychiatric/Behavioral: appropriate affect, mood and behavior.    No results found for this or any previous visit (from the past 336 hour(s)).       Assessment:   Upper Respiratory Infection    Plan:   Medication as ordered.  If symptoms worsen or do not improve, call for a follow up.  Follow up as needed.    Requested Prescriptions     Signed Prescriptions Disp Refills    TiZANidine (Zanaflex) 6 MG capsule 30 capsule 1     Sig: Take 1 capsule (6 mg total) by mouth 3 (three) times daily as needed for Muscle spasms    guaiFENesin (Mucinex) 600 MG 12 hr tablet 30 tablet 0     Sig: Take 2 tablets (1,200 mg total) by mouth 2 (two) times daily    doxycycline (VIBRAMYCIN) 50 MG capsule 28 capsule 0     Sig: Take 2 capsules (100 mg total) by mouth 2 (two) times daily for 7 days   Length of Visit: 7 minutes.   This visit was conducted with the use of interactive audio only telecommunications that permitted real-time communication between [employer] and myself. [name] consented to practice patient and received services at home, while I was located at [facility]. [facility].      Patient was counseled on possible medicine side effects which may include rash, swelling and/or stomach upset.  Patient was instructed to notify me or the ER if they experience problems.    No orders of the defined types were placed in this encounter.       Before leaving the office, the patient was informed of all ordered diagnostic tests and consults.  If you have not heard back from [place] about test results in 14 days, please call  the office at [phone].    Signed by: York Cerise, NP    Supervising Doctor: Rogelio Seen, MD    The patient's electronic medical record was reviewed, any changes in the past medical history, past surgical history, medications, diagnostic tests were noted, and the record was updated accordingly.     Discussed option available for my chart which provides electronic access to diagnostic results.

## 2019-04-09 ENCOUNTER — Encounter (RURAL_HEALTH_CENTER): Payer: Self-pay | Admitting: Family

## 2019-04-09 ENCOUNTER — Ambulatory Visit: Payer: Medicare Managed Care Other | Attending: Family | Admitting: Family

## 2019-04-09 DIAGNOSIS — W6191XA Bitten by other birds, initial encounter: Secondary | ICD-10-CM

## 2019-04-09 DIAGNOSIS — S61250A Open bite of right index finger without damage to nail, initial encounter: Secondary | ICD-10-CM

## 2019-04-09 DIAGNOSIS — L03011 Cellulitis of right finger: Secondary | ICD-10-CM

## 2019-04-09 MED ORDER — SULFAMETHOXAZOLE-TRIMETHOPRIM 800-160 MG PO TABS
1.00 | ORAL_TABLET | Freq: Two times a day (BID) | ORAL | 0 refills | Status: AC
Start: 2019-04-09 — End: 2019-04-16

## 2019-04-09 NOTE — Progress Notes (Signed)
PROGRESS NOTE      Patient Name: [name]  Primary Care Physician: Kathi Simpers, NP      History of Presenting Illness:   [name] is a 34 y.o. female who presents to the office with   Chief Complaint   Patient presents with   . Animal Bite     Bird bite     Patient presents with complaints of a bird bite.  Patient has a Theatre manager.  Patient states right pointer finger.  Bit down twice.   There is some bruising to the right pointer finger nailbed and anterior aspect of right pointer finger.  She noticed some some mild erythema, but no drainage, no fever.  She believes the bird is fully vaccinated as they have had it for several years.      This visit was conducted with the use of a HIPPA compliant interactive audiovisual telecommunication that permitted real time communication between [employer] and myself, Darlen Round, FNP-C. Consent to participation and receive services was obtained,  the patient was located at home, while I was located at [facility] clinic.  This visit was changed from an in-person visit to a telehealth visit to lower the risk of exposure and / or spread of the current pandemic with the SARS CoV-2 virus. This is based on the guidelines from the [facility] and other health agencies.  Past Medical History:     Past Medical History:   Diagnosis Date   . Anxiety    . Bipolar 1 disorder    . Chronic abdominal pain    . Chronic obstructive pulmonary disease    . Fibromyalgia    . Gastroesophageal reflux disease    . Insomnia    . Seasonal allergic rhinitis    . Small bowel obstruction        Past Surgical History:     Past Surgical History:   Procedure Laterality Date   . APPENDECTOMY     . CHOLECYSTECTOMY     . COLON RESECTION     . COLON SURGERY     . LAPAROTOMY, ABDOMINAL EXPLORATION      The patient has had multiple prior abdominal explorations, including colon resection, ileostomy's, and surgery for reported Crohn's disease.          Family History:     Family History   Problem Relation Age of Onset   . Hypertension Mother    . Diabetes Mother    . Leukemia Father    . Liver disease Maternal Grandfather    . Hyperlipidemia Paternal Grandfather        Social History:     Social History     Tobacco Use   Smoking Status Current Every Day Smoker   . Packs/day: 1.00   . Types: Cigarettes   Smokeless Tobacco Never Used     Social History     Substance and Sexual Activity   Alcohol Use Never     Social History     Substance and Sexual Activity   Drug Use Not Currently    Comment: opiates -- last use 2013       Allergies:     Allergies   Allergen Reactions   . Fentanyl Anaphylaxis   . Reglan [Metoclopramide]    . Rocephin [Ceftriaxone]    . Tramadol    . Vancomycin        Medications:     Prior to Admission medications  Medication Sig Start Date End Date Taking? Authorizing Provider   acetaminophen-codeine (TYLENOL #3) 300-30 MG per tablet Take 1 tablet by mouth 3 (three) times daily    03/07/19  Yes [provider]   albuterol sulfate HFA (PROVENTIL) 108 (90 Base) MCG/ACT inhaler Inhale 2 puffs into the lungs every 4 (four) hours as needed for Wheezing or Shortness of Breath 12/13/18 12/13/19 Yes Sharp, Skyler T, NP   buprenorphine-naloxone (SUBOXONE) 8-2 MG Film PLACE 1 STRIP UNDER THE TONGUE TWICE DAILY FOR 10 DAYS 04/01/19  Yes [provider]   clindamycin (CLEOCIN) 150 MG capsule TAKE 1 CAPSULE BY MOUTH THREE TIMES DAILY FOR 7 DAYS 04/01/19  Yes [provider]   clonazePAM (KlonoPIN) 0.5 MG tablet Take 0.5 mg by mouth daily as needed for Anxiety   Yes [provider]   diphenoxylate-atropine (Lomotil) 2.5-0.025 MG per tablet Take 2 tablets by mouth 4 (four) times daily 03/04/19 03/03/20 Yes Marja Kays T, NP   fluticasone-salmeterol (ADVAIR HFA) 230-21 MCG/ACT inhaler Inhale 2 puffs into the lungs 2 (two) times daily 12/13/18  Yes Sharp, Skyler T, NP   guaiFENesin (Mucinex) 600 MG 12 hr tablet Take 2 tablets  (1,200 mg total) by mouth 2 (two) times daily 04/01/19  Yes Barry Dienes, Dorotha C, FNP   naloxone (NARCAN) 4 MG/0.1ML nasal spray 1 spray intranasally. If pt does not respond or relapses into respiratory depression call 911. Give additional doses every 2-3 min. 03/10/19  Yes Arther Abbott, MD   ondansetron (Zofran ODT) 4 MG disintegrating tablet Take 1 tablet (4 mg total) by mouth every 8 (eight) hours as needed for Nausea 03/06/19  Yes Sharp, Skyler T, NP   pregabalin (LYRICA) 100 MG capsule Take 1 capsule (100 mg total) by mouth 2 (two) times daily 11/15/18 11/15/19 Yes Sharp, Skyler T, NP   propranolol (INDERAL) 20 MG tablet Take 20 mg by mouth 04/03/19  Yes [provider]   TiZANidine (Zanaflex) 6 MG capsule Take 1 capsule (6 mg total) by mouth 3 (three) times daily as needed for Muscle spasms 04/01/19  Yes York Cerise, FNP   topiramate (TOPAMAX) 25 MG tablet Take 25 mg by mouth 2 (two) times daily   Yes [provider]   traZODone (DESYREL) 300 MG tablet Take 300 mg by mouth nightly   Yes [provider]   venlafaxine (EFFEXOR-XR) 150 MG 24 hr capsule Take 150 mg by mouth every morning   Yes [provider]   doxycycline (VIBRAMYCIN) 50 MG capsule Take 2 capsules (100 mg total) by mouth 2 (two) times daily for 7 days 04/01/19 04/08/19  York Cerise, FNP   propranolol (INDERAL) 20 MG tablet  04/03/19 04/09/19  [provider]       Review of Systems:      Constitutional: Negative for fever, fatigue or weight changes.   HENT: Negative for ear pain, congestion, rhinorrhea and neck pain.    Eyes: Negative for discharge, redness and itching.   Respiratory: Negative for cough and shortness of breath.    Cardiovascular: Negative for chest pain, palpitations and leg swelling.   Gastrointestinal: Negative for nausea, vomiting, heartburn, abdominal pain, changes in bowel habits.  Genitourinary: Negative for dysuria, urgency and difficulty urinating.   Endocrine: Negative for  polyuria, polydipsia, polyphagia, or heat/cold intolerances.  Musculoskeletal: Negative for joint swelling and gait problem.   Neurological: Negative for dizziness, weakness and headaches.   Skin: See HPI.    Psychiatric/Behavioral: Negative.  Physical Exam:   There were no vitals filed for this visit.  There is no height or weight on file to calculate BMI.    General:  no acute distress. Pleasant and well groomed.  Neck: no JVD, no tracheal deviation  Lungs: Effort normal, no use accessory muscles.  Neuro:  alert and oriented.   Skin: no rashes.  Difficult to fully visualize, mild redness to right pointer finger.    Psychiatric/Behavioral: appropriate affect, mood and behavior.      No results found for this or any previous visit (from the past 336 hour(s)).   Physical Exam     Assessment:     1. Cellulitis of finger of right hand          Medical Decision Making:  I discussed this case with Dr. Tomasa Rand.  Per Up-to-date coverage is recommended due to bird peck.    Bactrim started.  Patient denies further questions.     I have provided the patient with the plan of care.  The patient denies any further questions, will follow up as needed.    Plan:   There are no Patient Instructions on file for this visit.     Requested Prescriptions     Signed Prescriptions Disp Refills   . sulfamethoxazole-trimethoprim (Bactrim DS) 800-160 MG per tablet 14 tablet 0     Sig: Take 1 tablet by mouth 2 (two) times daily for 7 days        Patient was counseled on possible medicine side effects which may include rash, swelling and/or stomach upset.  Patient was instructed to notify me or the ER if they experience problems.    No orders of the defined types were placed in this encounter.       Before leaving the office, the patient was informed of all ordered diagnostic tests and consults.     If you have not heard back from [place] about test results in 14 days, please call the office at [phone].    Signed by: Volney Presser,  NP    Supervising Doctor: Rogelio Seen, MD    The patient's electronic medical record was reviewed, any changes in the past medical history, past surgical history, medications, diagnostic tests were noted, and the record was updated accordingly.     Discussed option available for my chart which provides electronic access to diagnostic results.

## 2019-04-11 ENCOUNTER — Encounter: Payer: Self-pay | Admitting: Family

## 2019-04-11 DIAGNOSIS — M25511 Pain in right shoulder: Secondary | ICD-10-CM

## 2019-04-18 ENCOUNTER — Ambulatory Visit (RURAL_HEALTH_CENTER): Payer: Medicare Managed Care Other

## 2019-04-18 ENCOUNTER — Encounter (RURAL_HEALTH_CENTER): Payer: Self-pay

## 2019-04-21 ENCOUNTER — Encounter (RURAL_HEALTH_CENTER): Payer: Self-pay | Admitting: Family

## 2019-04-22 ENCOUNTER — Ambulatory Visit (HOSPITAL_BASED_OUTPATIENT_CLINIC_OR_DEPARTMENT_OTHER): Admit: 2019-04-22 | Payer: Self-pay | Admitting: Gastroenterology

## 2019-04-22 ENCOUNTER — Encounter (HOSPITAL_BASED_OUTPATIENT_CLINIC_OR_DEPARTMENT_OTHER): Payer: Self-pay

## 2019-04-22 SURGERY — EGD, COLONOSCOPY
Anesthesia: Conscious Sedation | Site: Abdomen

## 2019-04-30 ENCOUNTER — Ambulatory Visit
Admission: RE | Admit: 2019-04-30 | Discharge: 2019-04-30 | Disposition: A | Discharge status: Home / Self Care | Payer: Medicare Managed Care Other | Source: Ambulatory Visit | Attending: Family | Admitting: Family

## 2019-04-30 DIAGNOSIS — M25511 Pain in right shoulder: Secondary | ICD-10-CM

## 2019-04-30 DIAGNOSIS — M75111 Incomplete rotator cuff tear or rupture of right shoulder, not specified as traumatic: Secondary | ICD-10-CM | POA: Insufficient documentation

## 2019-05-03 ENCOUNTER — Emergency Department: Payer: Medicare Managed Care Other

## 2019-05-03 ENCOUNTER — Emergency Department
Admission: EM | Admit: 2019-05-03 | Discharge: 2019-05-03 | Disposition: A | Discharge status: Other Acute Inpatient Hospital | Payer: Medicare Managed Care Other | Attending: Family Medicine | Admitting: Family Medicine

## 2019-05-03 ENCOUNTER — Inpatient Hospital Stay
Admission: EM | Admit: 2019-05-03 | Discharge: 2019-05-06 | DRG: 392 | Disposition: A | Discharge status: Home / Self Care | Payer: Medicare Managed Care Other | Source: Other Acute Inpatient Hospital | Attending: Surgery | Admitting: Surgery

## 2019-05-03 DIAGNOSIS — Z7951 Long term (current) use of inhaled steroids: Secondary | ICD-10-CM

## 2019-05-03 DIAGNOSIS — R1113 Vomiting of fecal matter: Secondary | ICD-10-CM | POA: Insufficient documentation

## 2019-05-03 DIAGNOSIS — F319 Bipolar disorder, unspecified: Secondary | ICD-10-CM | POA: Diagnosis present

## 2019-05-03 DIAGNOSIS — K56609 Unspecified intestinal obstruction, unspecified as to partial versus complete obstruction: Secondary | ICD-10-CM

## 2019-05-03 DIAGNOSIS — K219 Gastro-esophageal reflux disease without esophagitis: Secondary | ICD-10-CM | POA: Diagnosis present

## 2019-05-03 DIAGNOSIS — K56601 Complete intestinal obstruction, unspecified as to cause: Secondary | ICD-10-CM

## 2019-05-03 DIAGNOSIS — K5090 Crohn's disease, unspecified, without complications: Secondary | ICD-10-CM | POA: Diagnosis present

## 2019-05-03 DIAGNOSIS — K5989 Other specified functional intestinal disorders: Secondary | ICD-10-CM | POA: Diagnosis present

## 2019-05-03 DIAGNOSIS — B182 Chronic viral hepatitis C: Secondary | ICD-10-CM | POA: Diagnosis present

## 2019-05-03 DIAGNOSIS — E282 Polycystic ovarian syndrome: Secondary | ICD-10-CM | POA: Diagnosis present

## 2019-05-03 DIAGNOSIS — Z79899 Other long term (current) drug therapy: Secondary | ICD-10-CM

## 2019-05-03 DIAGNOSIS — J449 Chronic obstructive pulmonary disease, unspecified: Secondary | ICD-10-CM | POA: Diagnosis present

## 2019-05-03 DIAGNOSIS — K3189 Other diseases of stomach and duodenum: Principal | ICD-10-CM | POA: Diagnosis present

## 2019-05-03 DIAGNOSIS — F411 Generalized anxiety disorder: Secondary | ICD-10-CM | POA: Diagnosis present

## 2019-05-03 DIAGNOSIS — Z9049 Acquired absence of other specified parts of digestive tract: Secondary | ICD-10-CM

## 2019-05-03 DIAGNOSIS — G8929 Other chronic pain: Secondary | ICD-10-CM | POA: Diagnosis present

## 2019-05-03 DIAGNOSIS — R569 Unspecified convulsions: Secondary | ICD-10-CM | POA: Diagnosis present

## 2019-05-03 DIAGNOSIS — R109 Unspecified abdominal pain: Secondary | ICD-10-CM | POA: Diagnosis present

## 2019-05-03 DIAGNOSIS — F17210 Nicotine dependence, cigarettes, uncomplicated: Secondary | ICD-10-CM | POA: Diagnosis present

## 2019-05-03 DIAGNOSIS — M797 Fibromyalgia: Secondary | ICD-10-CM | POA: Diagnosis present

## 2019-05-03 LAB — COMPREHENSIVE METABOLIC PANEL
ALT: 20 U/L (ref 0–55)
AST (SGOT): 20 U/L (ref 10–42)
Albumin/Globulin Ratio: 1.23 Ratio (ref 0.80–2.00)
Albumin: 3.8 gm/dL (ref 3.5–5.0)
Alkaline Phosphatase: 86 U/L (ref 40–145)
Anion Gap: 16.5 mMol/L (ref 7.0–18.0)
BUN / Creatinine Ratio: 7.8 Ratio — ABNORMAL LOW (ref 10.0–30.0)
BUN: 6 mg/dL — ABNORMAL LOW (ref 7–22)
Bilirubin, Total: 0.5 mg/dL (ref 0.1–1.2)
CO2: 21 mMol/L (ref 20.0–30.0)
Calcium: 8.7 mg/dL (ref 8.5–10.5)
Chloride: 109 mMol/L (ref 98–110)
Creatinine: 0.77 mg/dL (ref 0.60–1.20)
EGFR: 102 mL/min/{1.73_m2} (ref 60–150)
Globulin: 3.1 gm/dL (ref 2.0–4.0)
Glucose: 107 mg/dL — ABNORMAL HIGH (ref 71–99)
Osmolality Calculated: 283 mOsm/kg (ref 275–300)
Potassium: 3.5 mMol/L (ref 3.5–5.3)
Protein, Total: 6.9 gm/dL (ref 6.0–8.3)
Sodium: 143 mMol/L (ref 136–147)

## 2019-05-03 LAB — CBC AND DIFFERENTIAL
Bands: 2 % (ref 0–10)
Basophils %: 1 % (ref 0.0–3.0)
Basophils Absolute: 0.1 10*3/uL (ref 0.0–0.3)
Eosinophils %: 3 % (ref 0.0–7.0)
Eosinophils Absolute: 0.3 10*3/uL (ref 0.0–0.8)
Hematocrit: 43.8 % (ref 36.0–48.0)
Hemoglobin: 14.4 gm/dL (ref 12.0–16.0)
Lymphocytes Absolute: 2.2 10*3/uL (ref 0.6–5.1)
Lymphocytes: 24 % (ref 15.0–46.0)
MCH: 33 pg (ref 28–35)
MCHC: 33 gm/dL (ref 31–36)
MCV: 100 fL (ref 80–100)
MPV: 8.6 fL (ref 6.0–10.0)
Monocytes Absolute: 0.8 10*3/uL (ref 0.1–1.7)
Monocytes: 9 % (ref 3.0–15.0)
Neutrophils %: 61 % (ref 42.0–78.0)
Neutrophils Absolute: 5.7 10*3/uL (ref 1.7–8.6)
PLT CT: 220 10*3/uL (ref 130–440)
RBC: 4.4 10*6/uL (ref 3.80–5.00)
RDW: 11.5 % (ref 10.5–14.5)
WBC: 9.1 10*3/uL (ref 4.0–11.0)

## 2019-05-03 LAB — HCG, SERUM, QUALITATIVE: BHCG Qualitative: NEGATIVE

## 2019-05-03 LAB — LIPASE: Lipase: 16 U/L (ref 8–78)

## 2019-05-03 MED ORDER — SODIUM CHLORIDE (PF) 0.9 % IJ SOLN
3.00 mL | Freq: Three times a day (TID) | INTRAMUSCULAR | Status: DC
Start: 2019-05-03 — End: 2019-05-06
  Administered 2019-05-03 – 2019-05-06 (×8): 3 mL via INTRAVENOUS

## 2019-05-03 MED ORDER — NALOXONE HCL 0.4 MG/ML IJ SOLN (WRAP)
0.40 mg | INTRAMUSCULAR | Status: DC | PRN
Start: 2019-05-03 — End: 2019-05-06

## 2019-05-03 MED ORDER — FAMOTIDINE 20 MG/2ML IV SOLN
INTRAVENOUS | Status: AC
Start: 2019-05-03 — End: ?
  Filled 2019-05-03: qty 2

## 2019-05-03 MED ORDER — OXYMETAZOLINE HCL 0.05 % NA SOLN
NASAL | Status: AC
Start: 2019-05-03 — End: ?
  Filled 2019-05-03: qty 15

## 2019-05-03 MED ORDER — FLUTICASONE FUROATE-VILANTEROL 200-25 MCG/INH IN AEPB
1.00 | INHALATION_SPRAY | Freq: Every morning | RESPIRATORY_TRACT | Status: DC
Start: 2019-05-03 — End: 2019-05-06

## 2019-05-03 MED ORDER — MELATONIN 3 MG PO TABS
3.0000 mg | ORAL_TABLET | Freq: Every evening | ORAL | Status: DC | PRN
Start: 2019-05-03 — End: 2019-05-06

## 2019-05-03 MED ORDER — VH HYDROMORPHONE HCL PF 1 MG/ML CARPUJECT
0.25 mg | INTRAMUSCULAR | Status: DC | PRN
Start: 2019-05-03 — End: 2019-05-05
  Administered 2019-05-03 – 2019-05-05 (×16): 1 mg via INTRAVENOUS
  Filled 2019-05-03 (×14): qty 1

## 2019-05-03 MED ORDER — VENLAFAXINE HCL 37.5 MG PO TABS
75.0000 mg | ORAL_TABLET | Freq: Every day | ORAL | Status: DC
Start: 2019-05-03 — End: 2019-05-03
  Filled 2019-05-03: qty 2

## 2019-05-03 MED ORDER — KETOROLAC TROMETHAMINE 30 MG/ML IJ SOLN
15.00 mg | Freq: Four times a day (QID) | INTRAMUSCULAR | Status: DC
Start: 2019-05-03 — End: 2019-05-06
  Administered 2019-05-03 – 2019-05-06 (×12): 15 mg via INTRAVENOUS
  Filled 2019-05-03 (×3): qty 0.5
  Filled 2019-05-03: qty 1
  Filled 2019-05-03 (×3): qty 0.5
  Filled 2019-05-03: qty 1
  Filled 2019-05-03 (×6): qty 0.5
  Filled 2019-05-03 (×2): qty 1
  Filled 2019-05-03: qty 0.5
  Filled 2019-05-03: qty 1
  Filled 2019-05-03: qty 0.5
  Filled 2019-05-03: qty 1

## 2019-05-03 MED ORDER — PREGABALIN 150 MG PO CAPS
150.00 mg | ORAL_CAPSULE | Freq: Three times a day (TID) | ORAL | Status: DC
Start: 2019-05-03 — End: 2019-05-06
  Administered 2019-05-03 – 2019-05-06 (×8): 150 mg via ORAL
  Filled 2019-05-03 (×8): qty 1

## 2019-05-03 MED ORDER — VH HYDROMORPHONE HCL PF 1 MG/ML CARPUJECT
INTRAMUSCULAR | Status: AC
Start: 2019-05-03 — End: ?
  Filled 2019-05-03: qty 1

## 2019-05-03 MED ORDER — ONDANSETRON HCL 4 MG/2ML IJ SOLN
4.00 mg | INTRAMUSCULAR | Status: DC | PRN
Start: 2019-05-03 — End: 2019-05-06

## 2019-05-03 MED ORDER — ONDANSETRON HCL 4 MG/2ML IJ SOLN
4.00 mg | Freq: Once | INTRAMUSCULAR | Status: AC
Start: 2019-05-03 — End: 2019-05-03
  Administered 2019-05-03: 10:00:00 4 mg via INTRAVENOUS

## 2019-05-03 MED ORDER — PROPRANOLOL HCL 20 MG PO TABS
20.0000 mg | ORAL_TABLET | Freq: Three times a day (TID) | ORAL | Status: DC | PRN
Start: 2019-05-03 — End: 2019-05-06
  Administered 2019-05-03: 19:00:00 20 mg via ORAL
  Filled 2019-05-03: qty 1

## 2019-05-03 MED ORDER — ENOXAPARIN SODIUM 40 MG/0.4ML SC SOLN
40.00 mg | SUBCUTANEOUS | Status: DC
Start: 2019-05-03 — End: 2019-05-06
  Filled 2019-05-03 (×4): qty 0.4

## 2019-05-03 MED ORDER — VENLAFAXINE HCL ER 75 MG PO CP24
225.00 mg | ORAL_CAPSULE | Freq: Every day | ORAL | Status: DC
Start: 2019-05-03 — End: 2019-05-06
  Administered 2019-05-03 – 2019-05-06 (×4): 225 mg via ORAL
  Filled 2019-05-03 (×4): qty 3

## 2019-05-03 MED ORDER — BENZOCAINE 20% MT SOLN (WRAP)
OROMUCOSAL | Status: AC
Start: 2019-05-03 — End: ?
  Filled 2019-05-03: qty 0.28

## 2019-05-03 MED ORDER — VH HYDROMORPHONE HCL PF 1 MG/ML CARPUJECT
1.00 mg | Freq: Once | INTRAMUSCULAR | Status: AC
Start: 2019-05-03 — End: 2019-05-03
  Administered 2019-05-03: 10:00:00 1 mg via INTRAVENOUS

## 2019-05-03 MED ORDER — BENZOCAINE 20% MT SOLN (WRAP)
1.00 | Freq: Three times a day (TID) | OROMUCOSAL | Status: DC | PRN
Start: 2019-05-03 — End: 2019-05-03

## 2019-05-03 MED ORDER — ALUM & MAG HYDROXIDE-SIMETH 200-200-20 MG/5ML PO SUSP
15.00 mL | ORAL | Status: DC | PRN
Start: 2019-05-03 — End: 2019-05-06

## 2019-05-03 MED ORDER — ONDANSETRON 4 MG PO TBDP
4.00 mg | ORAL_TABLET | ORAL | Status: DC | PRN
Start: 2019-05-03 — End: 2019-05-06

## 2019-05-03 MED ORDER — CLONAZEPAM 0.5 MG PO TABS
0.5000 mg | ORAL_TABLET | Freq: Every day | ORAL | Status: DC | PRN
Start: 2019-05-03 — End: 2019-05-03
  Administered 2019-05-03: 19:00:00 0.5 mg via ORAL
  Filled 2019-05-03: qty 1

## 2019-05-03 MED ORDER — ONDANSETRON HCL 4 MG/2ML IJ SOLN
4.00 mg | INTRAMUSCULAR | Status: DC | PRN
Start: 2019-05-03 — End: 2019-05-03

## 2019-05-03 MED ORDER — DIPHENHYDRAMINE HCL 50 MG/ML IJ SOLN
12.50 mg | Freq: Four times a day (QID) | INTRAMUSCULAR | Status: DC | PRN
Start: 2019-05-03 — End: 2019-05-06

## 2019-05-03 MED ORDER — DOCUSATE SODIUM 100 MG PO CAPS
100.00 mg | ORAL_CAPSULE | Freq: Two times a day (BID) | ORAL | Status: DC
Start: 2019-05-03 — End: 2019-05-05
  Administered 2019-05-03 – 2019-05-05 (×2): 100 mg via ORAL
  Filled 2019-05-03 (×6): qty 1

## 2019-05-03 MED ORDER — SODIUM CHLORIDE (PF) 0.9 % IJ SOLN
20.00 mg | Freq: Two times a day (BID) | INTRAMUSCULAR | Status: DC
Start: 2019-05-03 — End: 2019-05-05
  Administered 2019-05-03 – 2019-05-05 (×5): 20 mg via INTRAVENOUS
  Filled 2019-05-03 (×5): qty 2

## 2019-05-03 MED ORDER — TOPIRAMATE 25 MG PO TABS
25.0000 mg | ORAL_TABLET | Freq: Two times a day (BID) | ORAL | Status: DC
Start: 2019-05-03 — End: 2019-05-06
  Administered 2019-05-03 – 2019-05-06 (×6): 25 mg via ORAL
  Filled 2019-05-03 (×7): qty 1

## 2019-05-03 MED ORDER — LACTATED RINGERS IV SOLN
INTRAVENOUS | Status: DC
Start: 2019-05-03 — End: 2019-05-04

## 2019-05-03 MED ORDER — PROPRANOLOL HCL 20 MG PO TABS
20.0000 mg | ORAL_TABLET | Freq: Every evening | ORAL | Status: DC
Start: 2019-05-03 — End: 2019-05-03
  Filled 2019-05-03: qty 1

## 2019-05-03 MED ORDER — TRAZODONE HCL 100 MG PO TABS
300.0000 mg | ORAL_TABLET | Freq: Every evening | ORAL | Status: DC
Start: 2019-05-03 — End: 2019-05-06
  Administered 2019-05-03 – 2019-05-05 (×3): 300 mg via ORAL
  Filled 2019-05-03 (×4): qty 3

## 2019-05-03 MED ORDER — TIZANIDINE HCL 4 MG PO TABS
4.0000 mg | ORAL_TABLET | Freq: Three times a day (TID) | ORAL | Status: DC | PRN
Start: 2019-05-03 — End: 2019-05-04
  Administered 2019-05-03: 19:00:00 4 mg via ORAL
  Filled 2019-05-03 (×8): qty 1

## 2019-05-03 MED ORDER — ONDANSETRON HCL 4 MG/2ML IJ SOLN
INTRAMUSCULAR | Status: AC
Start: 2019-05-03 — End: ?
  Filled 2019-05-03: qty 2

## 2019-05-03 MED ORDER — CLONAZEPAM 0.5 MG PO TABS
0.5000 mg | ORAL_TABLET | Freq: Two times a day (BID) | ORAL | Status: DC | PRN
Start: 2019-05-03 — End: 2019-05-06
  Administered 2019-05-04 – 2019-05-06 (×6): 0.5 mg via ORAL
  Filled 2019-05-03 (×6): qty 1

## 2019-05-03 MED ORDER — LACTATED RINGERS IV SOLN
INTRAVENOUS | Status: DC
Start: 2019-05-03 — End: 2019-05-03

## 2019-05-03 MED ORDER — FAMOTIDINE 10 MG/ML IV SOLN (WRAP)
20.00 mg | INTRAVENOUS | Status: DC
Start: 2019-05-04 — End: 2019-05-03

## 2019-05-03 MED ORDER — IOHEXOL 350 MG/ML IV SOLN
90.00 mL | Freq: Once | INTRAVENOUS | Status: AC | PRN
Start: 2019-05-03 — End: 2019-05-03
  Administered 2019-05-03: 90 mL via INTRAVENOUS

## 2019-05-03 MED ORDER — ENALAPRILAT 1.25 MG/ML IV INJ
1.25 mg | INJECTION | Freq: Four times a day (QID) | INTRAVENOUS | Status: DC | PRN
Start: 2019-05-03 — End: 2019-05-06
  Filled 2019-05-03: qty 1

## 2019-05-03 MED ORDER — VENLAFAXINE HCL ER 75 MG PO CP24
150.00 mg | ORAL_CAPSULE | Freq: Every day | ORAL | Status: DC
Start: 2019-05-03 — End: 2019-05-03
  Filled 2019-05-03: qty 2

## 2019-05-03 MED ORDER — ALBUTEROL SULFATE HFA 108 (90 BASE) MCG/ACT IN AERS
2.00 | INHALATION_SPRAY | Freq: Four times a day (QID) | RESPIRATORY_TRACT | Status: DC | PRN
Start: 2019-05-03 — End: 2019-05-06

## 2019-05-03 NOTE — ED Notes (Addendum)
[facility] EMERGENCY DEPARTMENT  ED NURSING NOTE FOR THE RECEIVING INPATIENT NURSE   ED NURSE PHONE: [phone]    ADMISSION INFORMATION   [name] is a 34 y.o. female admitted with an ED diagnosis of:  1. SBO (small bowel obstruction)      ED Pre-Departure Assessment:  No acute or worsening changes noted. C/o 9/10 periumbilical abdominal pain, dilaudid given at [facility]   Isolation None   Home O2 No   Patient Comes From: Home Independent   Mental Status: alert and oriented   Ambulation: no difficulty   Pertinent Info/Safety Concern: None

## 2019-05-03 NOTE — ED Notes (Signed)
Bed: N9-A  Expected date: 05/03/19  Expected time: 9:50 AM  Means of arrival: [facility]  Comments:

## 2019-05-03 NOTE — ED Notes (Signed)
Pt states that she does not want NG tube at this time. No vomiting noted since arrival to ED

## 2019-05-03 NOTE — ED Notes (Signed)
Pt passed small stool. MD aware that pt does not want an NG tube at this time.

## 2019-05-03 NOTE — ED Notes (Signed)
Dr. Boyd paged.

## 2019-05-03 NOTE — H&P (Addendum)
HISTORY AND PHYSICAL - ACCESS   Name:  HALLE, DAVLIN                 MR#:  [id number]               Date:  05/03/19       IMPRESSION:   34 y.o. 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  This is functional and not correctable by surgery.  Unfortunately her correct diagnosis has never been entered into the chart but she has begun outpatient evaluation to aid her with this problem.    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    PLAN:   Admit to Surgical floor.  NPO, no NGT - hydrate, bowel regimen, pepcid; pain control  Home meds  R/S colonoscopy after discharge with [facility] (missed 3/30 for transportation issues) to rule out ileorectal anastomosis stricture  Keep appt at [facility] in June with GI doc     Solon Palm, MD    X [phone], Pager #[phone]  [facility] ACCESS Program  - Phone [phone] or Pager #[phone]    Incidental findings:  none  Additional Info:  Care plan and expectations reviewed with Patient  Questions were answered.  I have personally reviewed the images for last several CT scan and recent SBFT - SB chronically dilated at 6-7cm and every read states "colon" but hse has none.    CC:  Patient complains of generalized abdominal pain.      HPI:   34 y.o. female presents with generalized abdominal pain.    The pain is described as cramping, sharp and stabbing, moderate to severe, and has been constant and waxing and waning in intensity.  The pain is without radiation.    The pain began 2-3 days ago and has waxed and waned but are worse overall.  Associated symptoms include bloating,  constipation, nausea and vomiting. Alleviating factors include none. She has had similar episodes in the past.  Her only unusual dietary change was Dione Plover just prior.  Emesis is dark brown and she has not taken lomotil in some time.    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     PSH:   She  has a past surgical history that includes Cholecystectomy; Appendectomy; LAPAROTOMY, COLECTOMY, TOTAL (2011); Ileostomy (2011); Ileostomy closure (2012); Double Barrel Enterostomy (2013); Enterostomy reversal (2014); SALPINGECTOMY, OPEN (Right); and Ovarian cyst surgery (2011-2016).     Social History:   She  reports that she has been smoking cigarettes. She has been smoking about 1.00 pack per day. She has never used smokeless tobacco. She reports previous drug use. She reports that she does not drink alcohol.    Family History:   Family history has been reviewed and has no pertinent positives to this evaluation.  Home Medications:     Outpatient Medications Marked as Taking for the 05/03/19 encounter [facility] Encounter)   Medication Sig Dispense Refill    albuterol sulfate HFA (PROVENTIL) 108 (90 Base) MCG/ACT inhaler Inhale 2 puffs into the lungs every 4 (four) hours as needed for Wheezing or Shortness of Breath 1 Inhaler 2    clonazePAM (KlonoPIN) 0.5 MG tablet Take 0.5 mg by mouth 3 (three) times daily as needed for Anxiety         diphenoxylate-atropine (LOMOTIL) 2.5-0.025 MG per tablet Take 1 tablet by mouth 4 (four) times daily as needed for Diarrhea      fluticasone-salmeterol (ADVAIR HFA) 230-21 MCG/ACT inhaler Inhale 2 puffs into the lungs 2 (two) times daily 12 g 2    ondansetron (Zofran ODT) 4 MG  disintegrating tablet Take 1 tablet (4 mg total) by mouth every 8 (eight) hours as needed for Nausea 30 tablet 0    pregabalin (LYRICA) 150 MG capsule Take 150 mg by mouth 3 (three) times daily         propranolol (INDERAL) 20 MG tablet Take 20 mg by mouth 3 (three) times daily         TiZANidine (ZANAFLEX) 4 MG capsule Take 8 mg by mouth 3 (three) times daily as needed      topiramate (TOPAMAX) 25 MG tablet Take 25 mg by mouth every 12 (twelve) hours         traZODone (DESYREL) 300 MG tablet Take 300 mg by mouth nightly      venlafaxine (EFFEXOR-XR) 150 MG 24 hr capsule Take 150 mg by mouth daily      venlafaxine (EFFEXOR-XR) 75 MG 24 hr capsule Take 75 mg by mouth daily      [DISCONTINUED] TiZANidine (Zanaflex) 6 MG capsule Take 1 capsule (6 mg total) by mouth 3 (three) times daily as needed for Muscle spasms 30 capsule 1    [DISCONTINUED] venlafaxine (EFFEXOR) 75 MG tablet Take 75 mg by mouth daily         Allergies:   She is allergic to fentanyl; vancomycin; reglan [metoclopramide]; rocephin [ceftriaxone]; tramadol; and walnuts [tree nuts].     ROS      (10+):   Pertinent details are included in HPI.    Constitutional:  Positive for feels ill and decreased appetite.  Negative for fevers and chills.   ENT:  Negative for sore throat and runny nose/nasal congestion.   Pulmonary:  Negative for dyspnea on exertion , cough and recent URI.   Cardiovascular:  Negative for exertional chest pain, palpitations and lower extremity edema/swelling.   GI:  Positive for nausea, vomiting and bloating/distention.  Negative for hematochezia and melena.   GU:  Negative for dysuria and urgency/frequency.   Musculoskeletal:  Positive for myalgias, fibromyalgia.  Negative for arthritis.   Neuro:  Negative for syncope, dizziness, weakness of extremities chronic and gait issues/unsteady gait  Heme/Lymph:  Negative for history DVT, history phlebitis and bleeding disorder.   Skin:  Negative for rash and itching.     Physical Exam       (8+)   Blood pressure 111/72, pulse 76, temperature 97.7 F (36.5 C), temperature source Temporal, resp. rate 16, height 1.651 m (5\' 5" ), weight 58.1 kg (128 lb), last menstrual period 04/25/2019, SpO2 97 %.   General: in no apparent distress, non-toxic  HEENT(2-eye/+): normocephalic, atraumatic, pupils equal @ 3 mm, oral mucosa is pink and moist  Chest: lungs clear to ausculation, equal breath sounds, unlabored respirations  Cardiac: regular rate and rhythm without murmurs/rubs/gallops  Abdomen: hypoactive bowel sounds, soft, moderate generalized tenderness without rebound and without guarding, minimally/borderline distended, nontender to percussion  Extremities: no edema, 2+ distal DP pulses bilaterally and no tenderness or deformities noted  Neuro: alert, speech normal, no gross focal neurologic deficits, moves all extremities well  Psych: normal affect, insight good  Skin: no jaundice, no rashes, normothermic    Labs:    Lab results have been reviewed as follows:  OSH Labs:  Na 143, K 3.5, Cl 109, bicarb 21, BUN 6, Cr 0.77, gluc 107  OSH Labs:  WBC 9.1 (diff =  2% bands but o/w nl), Hb 14.4, Hct 43.8, plt 220  OSH Labs:  nl LFTs; HCG negative  [facility] labs as follows:  Results     ** No results found for the last 24 hours. **           Radiology:   CT Abdomen Pelvis with IV Cont    Result Date: 05/03/2019  1.  There are multiple dilated loops of small and large bowel, similar in configuration to prior study. No discrete transition point from dilated to collapsed bowel is seen. Findings are suspected to represent ileus. Partial or intermittent obstruction could have a similar appearance. Bowel wall thickening and mucosal hyperenhancement suggestive of enteritis on prior examination are no longer present. 2.  Postsurgical changes of prior partial small bowel resection are noted with multiple anastomotic suture lines. 3.  There are small nonobstructive left renal stones. 4.  Prior cholecystectomy.  ReadingStation:[id number]      Outside films: as above
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
## 2019-05-03 NOTE — ED Provider Notes (Signed)
History     Chief Complaint   Patient presents with    Abdominal Pain    Emesis     HPI   The pt is a very pleasant 34 YO F w/ PMH multiple abdominal surgeries and small bowel obstructions who presents to the ER c/o several hour h/o periumbilical moderate abdominal pain that is sharp, nonradiating with no alleviating or exacerbating factors associated with nausea with intermittent nonbloody, nonbilious, nonprojectile but feculent-smelling emesis. No fever, chills, CP, SOB. No numbness, tingling or weakness anywhere. She reports she feels like she has a bowel obstruction again. No other complaints.     Past Medical History:   Diagnosis Date    Anxiety     Bipolar 1 disorder     Chronic abdominal pain     Chronic obstructive pulmonary disease     Fibromyalgia     Gastroesophageal reflux disease     Insomnia     Seasonal allergic rhinitis     Small bowel obstruction        Past Surgical History:   Procedure Laterality Date    APPENDECTOMY      CHOLECYSTECTOMY      COLON RESECTION      COLON SURGERY      LAPAROTOMY, ABDOMINAL EXPLORATION      The patient has had multiple prior abdominal explorations, including colon resection, ileostomy's, and surgery for reported Crohn's disease.         Family History   Problem Relation Age of Onset    Hypertension Mother     Diabetes Mother     Leukemia Father     Liver disease Maternal Grandfather     Hyperlipidemia Paternal Grandfather        Social  Social History     Tobacco Use    Smoking status: Current Every Day Smoker     Packs/day: 1.00     Types: Cigarettes    Smokeless tobacco: Never Used   Substance Use Topics    Alcohol use: Never    Drug use: Not Currently     Comment: opiates -- last use 2013       .     Allergies   Allergen Reactions    Fentanyl Anaphylaxis    Reglan [Metoclopramide]     Rocephin [Ceftriaxone]     Tramadol     Vancomycin        Home Medications     Med List Status: Complete Set By: Brown Human, RN at 05/03/2019   8:09 AM                acetaminophen-codeine (TYLENOL #3) 300-30 MG per tablet     Take 1 tablet by mouth 3 (three) times daily        albuterol sulfate HFA (PROVENTIL) 108 (90 Base) MCG/ACT inhaler     Inhale 2 puffs into the lungs every 4 (four) hours as needed for Wheezing or Shortness of Breath     buprenorphine-naloxone (SUBOXONE) 8-2 MG Film     PLACE 1 STRIP UNDER THE TONGUE TWICE DAILY FOR 10 DAYS     clindamycin (CLEOCIN) 150 MG capsule     TAKE 1 CAPSULE BY MOUTH THREE TIMES DAILY FOR 7 DAYS     clonazePAM (KlonoPIN) 0.5 MG tablet     Take 0.5 mg by mouth daily as needed for Anxiety     diphenoxylate-atropine (Lomotil) 2.5-0.025 MG per tablet     Take 2 tablets by mouth 4 (  four) times daily     fluticasone-salmeterol (ADVAIR HFA) 230-21 MCG/ACT inhaler     Inhale 2 puffs into the lungs 2 (two) times daily     guaiFENesin (Mucinex) 600 MG 12 hr tablet     Take 2 tablets (1,200 mg total) by mouth 2 (two) times daily     naloxone (NARCAN) 4 MG/0.1ML nasal spray     1 spray intranasally. If pt does not respond or relapses into respiratory depression call 911. Give additional doses every 2-3 min.     ondansetron (Zofran ODT) 4 MG disintegrating tablet     Take 1 tablet (4 mg total) by mouth every 8 (eight) hours as needed for Nausea     pregabalin (LYRICA) 100 MG capsule     Take 1 capsule (100 mg total) by mouth 2 (two) times daily     propranolol (INDERAL) 20 MG tablet     Take 20 mg by mouth     TiZANidine (Zanaflex) 6 MG capsule     Take 1 capsule (6 mg total) by mouth 3 (three) times daily as needed for Muscle spasms     topiramate (TOPAMAX) 25 MG tablet     Take 25 mg by mouth 2 (two) times daily     traZODone (DESYREL) 300 MG tablet     Take 300 mg by mouth nightly     venlafaxine (EFFEXOR-XR) 150 MG 24 hr capsule     Take 150 mg by mouth every morning           Review of Systems   Constitutional: Negative.  Negative for chills and fever.   HENT: Negative.    Eyes: Negative.    Respiratory: Negative.   Negative for chest tightness and shortness of breath.    Cardiovascular: Negative.  Negative for chest pain, palpitations and leg swelling.   Gastrointestinal: Positive for abdominal distention, abdominal pain, nausea and vomiting. Negative for anal bleeding, blood in stool, constipation, diarrhea and rectal pain.   Endocrine: Negative.    Genitourinary: Negative.  Negative for dysuria.   Musculoskeletal: Negative for neck pain and neck stiffness.   Skin: Negative.    Allergic/Immunologic: Negative.    Neurological: Negative.  Negative for dizziness and headaches.   Hematological: Negative.    Psychiatric/Behavioral: Negative for suicidal ideas.   All other systems reviewed and are negative.      Physical Exam    BP: 117/78, Heart Rate: 98, Temp: 97.8 F (36.6 C), Resp Rate: 16, SpO2: 92 %, Weight: 58.9 kg    Physical Exam  Vitals and nursing note reviewed.   Constitutional:       General: She is not in acute distress.     Appearance: She is well-developed. She is not ill-appearing, toxic-appearing or diaphoretic.   HENT:      Head: Normocephalic and atraumatic.      Mouth/Throat:      Mouth: Mucous membranes are moist.      Pharynx: Oropharynx is clear.   Eyes:      Extraocular Movements: Extraocular movements intact.      Pupils: Pupils are equal, round, and reactive to light.   Cardiovascular:      Rate and Rhythm: Normal rate and regular rhythm.      Heart sounds: Normal heart sounds.   Pulmonary:      Effort: Pulmonary effort is normal. No respiratory distress.      Breath sounds: No stridor. No wheezing, rhonchi or rales.   Chest:  Chest wall: No tenderness.   Abdominal:      General: Bowel sounds are increased. There is distension.      Palpations: Abdomen is soft. There is no shifting dullness, hepatomegaly or pulsatile mass.      Tenderness: There is abdominal tenderness in the periumbilical area. There is no right CVA tenderness, left CVA tenderness, guarding or rebound.   Skin:     General: Skin is  warm and dry.      Capillary Refill: Capillary refill takes less than 2 seconds.      Coloration: Skin is not cyanotic.      Findings: No erythema.   Neurological:      General: No focal deficit present.      Mental Status: She is alert and oriented to person, place, and time.      Cranial Nerves: No cranial nerve deficit.      Motor: No weakness.   Psychiatric:         Mood and Affect: Mood normal.         Behavior: Behavior normal.       Results     Procedure Component Value Units Date/Time    Beta HCG, Qual, Serum [[id number]] Collected: 05/03/19 0820    Specimen: Plasma Updated: 05/03/19 0858     BHCG Qualitative Negative    Narrative:      SAMPLE MODERATELY HEMOLYZED    CBC and differential [[id number]] Collected: 05/03/19 0820    Specimen: Blood Updated: 05/03/19 0856     WBC 9.1 K/cmm      RBC 4.40 M/cmm      Hemoglobin 14.4 gm/dL      Hematocrit 78.2 %      MCV 100 fL      MCH 33 pg      MCHC 33 gm/dL      RDW 95.6 %      PLT CT 220 K/cmm      MPV 8.6 fL      Neutrophils % 61.0 %      Lymphocytes 24.0 %      Monocytes 9.0 %      Eosinophils % 3.0 %      Basophils % 1.0 %      Bands 2 %      Neutrophils Absolute 5.7 K/cmm      Lymphocytes Absolute 2.2 K/cmm      Monocytes Absolute 0.8 K/cmm      Eosinophils Absolute 0.3 K/cmm      Basophils Absolute 0.1 K/cmm      RBC Morphology Normocytic/Normochromic    Narrative:      Manual differential performed    Comprehensive metabolic panel [[id number]]  (Abnormal) Collected: 05/03/19 0820    Specimen: Plasma Updated: 05/03/19 0847     Sodium 143 mMol/L      Potassium 3.5 mMol/L      Chloride 109 mMol/L      CO2 21.0 mMol/L      Calcium 8.7 mg/dL      Glucose 469 mg/dL      Creatinine 6.29 mg/dL      BUN 6 mg/dL      Protein, Total 6.9 gm/dL      Albumin 3.8 gm/dL      Alkaline Phosphatase 86 U/L      ALT 20 U/L      AST (SGOT) 20 U/L      Bilirubin, Total 0.5 mg/dL      Albumin/Globulin Ratio 1.23 Ratio  Anion Gap 16.5 mMol/L      BUN / Creatinine Ratio 7.8 Ratio       EGFR 102 mL/min/1.34m2      Osmolality Calculated 283 mOsm/kg      Globulin 3.1 gm/dL     Lipase [[id number]] Collected: 05/03/19 0820    Specimen: Plasma Updated: 05/03/19 0847     Lipase 16 U/L         Radiology Results (24 Hour)     Procedure Component Value Units Date/Time    CT Abdomen Pelvis with IV Cont [[id number]] Collected: 05/03/19 0918    Order Status: Completed Updated: 05/03/19 0928    Narrative:      Clinical History:  n/v abd pain h/o SBO    Examination:  CT scan of the abdomen and pelvis with intravenous contrast. No oral contrast was administered.    CT images were acquired utilizing Automated Exposure Control for dose reduction. Sagittal and coronal reformations were reviewed.    Contrast:  IOHEXOL 350 MG/ML IV SOLN/90 mL    Comparison:  March 11, 2019    Findings:    Lower chest:  Lung bases: There are mild atelectatic changes at the lung bases. No consolidation or pleural effusion is seen.  Heart: The heart is normal in size.    Abdomen:  Liver: Unremarkable.  Biliary system: The gallbladder is surgically absent. There is mild dilatation of the common duct which likely represents reservoir effect in the setting of prior cholecystectomy.  Spleen: Unremarkable.  Pancreas:Unremarkable.  Adrenal glands:Unremarkable.  Kidneys: There are symmetric nephrograms without evidence of hydronephrosis. Multiple nonobstructive stones measuring up to approximately 2 mm are noted in the left renal pelvis. No ureteral stones are seen.  Stomach: Unremarkable.  Bowel: There are multiple anastomotic sutures along the bowel compatible with prior partial small bowel resection. There are multiple mildly dilated loops of distal small bowel. Several moderately distended gas-filled loops of colon are noted as well. No   abrupt transition from dilated to collapsed bowel is seen to suggest the point of obstruction. Findings are favored to represent ileus.  Appendix: The appendix is not identified as a discrete  structure. There are no pericecal inflammatory changes.  Peritoneal cavity:No free air, free fluid or pathologic adenopathy is identified.  Retroperitoneum: There are no significant retroperitoneal inflammatory changes. No pathologic adenopathy is seen. The aorta and IVC are normal in course and caliber.    Pelvis:  Bladder: Unremarkable.  Reproductive system: No abnormal adnexal masses are seen. The uterus is grossly normal in appearance.  Miscellaneous: No free fluid or pathologic adenopathy is seen in the pelvis.    Other:  Bones: The regional osseous structures appear intact without suspicious blastic or lytic lesions.  Body wall: No subcutaneous masses are seen.      Impression:      1.  There are multiple dilated loops of small and large bowel, similar in configuration to prior study. No discrete transition point from dilated to collapsed bowel is seen. Findings are suspected to represent ileus. Partial or intermittent obstruction   could have a similar appearance. Bowel wall thickening and mucosal hyperenhancement suggestive of enteritis on prior examination are no longer present.  2.  Postsurgical changes of prior partial small bowel resection are noted with multiple anastomotic suture lines.  3.  There are small nonobstructive left renal stones.  4.  Prior cholecystectomy.    ReadingStation:[id number]              MDM and ED  Course     ED Medication Orders (From admission, onward)    None             MDM    TRANSFER OF CARE:  This patient's care has been transferred to and accepted by Dr. Liliana Cline at 9 AM 05/03/2019. We discussed: the patient's chief complaint; labs and imaging that have been completed and those that are still pending; procedures that have been completed and those remaining to be done; any treatment provided and the patient's response to treatment; any significant change in condition; input from consultants if any; the treatment plan prior to the transfer of care. The accepting physician will  follow up on all pending labs and imaging and make any necessary changes to the current impression and/or treatment plan. The accepting physician is now responsible for the patient's care and final disposition.  ED Course as of May 02 949   Sat May 03, 2019   1610 Care of this patient is signed over to me by Dr. Iva Lento at the change of shift at 0900.  I have independently repeated and confirmed his history and physical exam.  Briefly, she has a longstanding history of Crohn's disease and colonic inertia leading to multiple surgeries when she lived in [facility] including total colectomy and multiple ileostomy's and reversals without current ostomy.  She has had small bowel obstructions in the past and starting last night she has experienced abdominal distention, increased abdominal pain, obstipation, and several episodes of feculent vomiting.  The last time she had feculent vomiting, she says, was in 2011 at which time she became septic and quite ill.  She denies current fever.  She denies any other complaints.    [RL]   [id number] I discussed this case with Dr Leavy Cella, [facility] surgery, who reviewed the films with me and kindly agrees to accept the patient in transfer ER-to-ER. NPO.     [RL]      ED Course User Index  [RL] Elenor Quinones, MD         Clinical Impression & Disposition     Clinical Impression  Final diagnoses:   Complete intestinal obstruction, unspecified cause   Vomiting of fecal matter with nausea        ED Disposition     ED Disposition Condition Date/Time Comment    Transfer to Another Facility  Sat May 03, 2019  9:52 AM Leonard Schwartz Arvilla Meres should be transferred out to [facility] ER.           New Prescriptions    No medications on file                 Elenor Quinones, MD  05/03/19 [id number]

## 2019-05-03 NOTE — ED Triage Notes (Signed)
Patient to room N9/N9-A    [name] is a 34 y.o. female presenting to the ED for Abdominal Pain    The patient arrived by ambulance and is alone.    Nursing (triage) note reviewed for the following pertinent information:  Transfer from [facility] for SBO.    Accepted by Dr Leavy Cella. C/o abdominal pain and emesis x 2 days. CT showed Multiple loops. Hx of 2 ileostomies. Given Zofran and dilaudid at 1006

## 2019-05-04 ENCOUNTER — Encounter: Payer: Self-pay | Admitting: Surgery

## 2019-05-04 DIAGNOSIS — J449 Chronic obstructive pulmonary disease, unspecified: Secondary | ICD-10-CM | POA: Diagnosis present

## 2019-05-04 LAB — CBC AND DIFFERENTIAL
Basophils %: 0.8 % (ref 0.0–3.0)
Basophils Absolute: 0.1 10*3/uL (ref 0.0–0.3)
Eosinophils %: 4 % (ref 0.0–7.0)
Eosinophils Absolute: 0.2 10*3/uL (ref 0.0–0.8)
Hematocrit: 38.8 % (ref 36.0–48.0)
Hemoglobin: 12.8 gm/dL (ref 12.0–16.0)
Lymphocytes Absolute: 2.4 10*3/uL (ref 0.6–5.1)
Lymphocytes: 39.4 % (ref 15.0–46.0)
MCH: 34 pg (ref 28–35)
MCHC: 33 gm/dL (ref 32–36)
MCV: 104 fL — ABNORMAL HIGH (ref 80–100)
MPV: 8.5 fL (ref 6.0–10.0)
Monocytes Absolute: 0.9 10*3/uL (ref 0.1–1.7)
Monocytes: 14.4 % (ref 3.0–15.0)
Neutrophils %: 41.4 % — ABNORMAL LOW (ref 42.0–78.0)
Neutrophils Absolute: 2.5 10*3/uL (ref 1.7–8.6)
PLT CT: 240 10*3/uL (ref 130–440)
RBC: 3.74 10*6/uL — ABNORMAL LOW (ref 3.80–5.00)
RDW: 11.7 % (ref 11.0–14.0)
WBC: 6.1 10*3/uL (ref 4.0–11.0)

## 2019-05-04 LAB — BASIC METABOLIC PANEL
Anion Gap: 9.7 mMol/L (ref 7.0–18.0)
BUN / Creatinine Ratio: 10.4 Ratio (ref 10.0–30.0)
BUN: 7 mg/dL (ref 7–22)
CO2: 24 mMol/L (ref 20–30)
Calcium: 8.4 mg/dL — ABNORMAL LOW (ref 8.5–10.5)
Chloride: 110 mMol/L (ref 98–110)
Creatinine: 0.67 mg/dL (ref 0.60–1.20)
EGFR: 117 mL/min/{1.73_m2} (ref 60–150)
Glucose: 85 mg/dL (ref 71–99)
Osmolality Calculated: 277 mOsm/kg (ref 275–300)
Potassium: 3.7 mMol/L (ref 3.5–5.3)
Sodium: 140 mMol/L (ref 136–147)

## 2019-05-04 LAB — PHOSPHORUS: Phosphorus: 2.6 mg/dL (ref 2.3–4.7)

## 2019-05-04 LAB — MAGNESIUM: Magnesium: 1.8 mg/dL (ref 1.6–2.6)

## 2019-05-04 MED ORDER — KCL IN DEXTROSE-NACL 20-5-0.45 MEQ/L-%-% IV SOLN
INTRAVENOUS | Status: DC
Start: 2019-05-04 — End: 2019-05-05
  Filled 2019-05-04 (×3): qty 1000

## 2019-05-04 MED ORDER — TIZANIDINE HCL 4 MG PO TABS
8.0000 mg | ORAL_TABLET | Freq: Three times a day (TID) | ORAL | Status: DC | PRN
Start: 2019-05-04 — End: 2019-05-06
  Administered 2019-05-04 – 2019-05-06 (×5): 8 mg via ORAL
  Filled 2019-05-04 (×5): qty 2

## 2019-05-04 NOTE — Assessment & Plan Note (Signed)
A/P:  chronic, no issues    - home meds

## 2019-05-04 NOTE — Assessment & Plan Note (Signed)
A/P: no issues   - observe

## 2019-05-04 NOTE — Progress Notes (Addendum)
NURSE NOTE SUMMARY  [facility] - 4TH SURGICAL   Patient Name: [name],[name]   Attending Physician: Solon Palm, MD   Today's date:   05/04/2019 LOS: 1 days   Shift Summary:                                                                0700: received report and assumed care of patient    0732: assessment completed and morning medications administered    EOS: uneventful shift - pain controlled with current medication regimen - multiple doses of PRN pain medications administered - tolerating current diet - denies nausea / vomiting - BM - voiding adequate amounts - ambulated multiple times in hallway  without issues.     1845: report given to Mamie Nick, RN     Provider Notifications:        Rapid Response Notifications:  Mobility:        PMP Activity: Step 6 - Walks in Room (05/03/2019  9:23 PM)     Weight tracking:  Family Dynamic:     Last 3 Weights for the past 72 hrs (Last 3 readings):   Weight   05/03/19 1308   58.1 kg (128 lb)             Last Bowel Movement      05/04/19

## 2019-05-04 NOTE — Assessment & Plan Note (Signed)
A/P: no issues   - pepcid

## 2019-05-04 NOTE — Plan of Care (Signed)
Problem: Altered GI Function  Goal: Elimination patterns are normal or improving  Outcome: Progressing  Flowsheets (Taken 05/04/2019 0136)  Elimination patterns are normal or improving:   Report abnormal assessment to physician   Assess for normal bowel sounds   Monitor for abdominal discomfort   Administer treatments as ordered   Anticipate/assist with toileting needs   Monitor for abdominal distension   Assess for signs and symptoms of bleeding.  Report signs of bleeding to physician  Goal: Nutritional intake is adequate  Outcome: Progressing  Flowsheets (Taken 05/04/2019 0136)  Nutritional intake is adequate:   Assist patient with meals/food selection   Allow adequate time for meals   Encourage/perform oral hygiene as appropriate   Encourage/administer dietary supplements as ordered (i.e. tube feed, TPN, oral, OGT/NGT, supplements)

## 2019-05-04 NOTE — Assessment & Plan Note (Signed)
A/P: chronic, no issues    - no home meds

## 2019-05-04 NOTE — Assessment & Plan Note (Signed)
A/P: no issues or home meds   - observe

## 2019-05-04 NOTE — Assessment & Plan Note (Addendum)
A/P: symptoms improved, no surgical options   - hold colace d/t incontinence, postop solids    - outpatient GI eval - Dr. Carmelina Noun & [facility]

## 2019-05-04 NOTE — Assessment & Plan Note (Signed)
A/P: resolved with prior colectomy, NAD known   - observe

## 2019-05-04 NOTE — Plan of Care (Signed)
NURSE NOTE SUMMARY  [facility] - 4TH SURGICAL   Patient Name: [name],[name]   Attending Physician: Solon Palm, MD   Today's date:   05/04/2019 LOS: 1 days   Shift Summary:                                                                 Provider Notifications:      Rapid Response Notifications:  Mobility:      PMP Activity: Step 6 - Walks in Room (05/04/2019  7:43 PM)     Weight tracking:  Family Dynamic:   Last 3 Weights for the past 72 hrs (Last 3 readings):   Weight   05/03/19 1308 58.1 kg (128 lb)             Recent Vitals Last Bowel Movement   BP: 111/81 (05/04/2019  7:20 PM)  Heart Rate: 83 (05/04/2019  7:20 PM)  Temp: 98.2 F (36.8 C) (05/04/2019  7:20 PM)  Resp Rate: 16 (05/04/2019  7:20 PM)  Height: 1.651 m (5\' 5" ) (05/03/2019  1:08 PM)  Weight: 58.1 kg (128 lb) (05/03/2019  1:08 PM)  SpO2: 97 % (05/04/2019  7:20 PM)   Last BM Date: 05/04/19         Problem: Altered GI Function  Goal: Elimination patterns are normal or improving  Outcome: Progressing  Flowsheets (Taken 05/04/2019 0136 by Maple Mirza, RN)  Elimination patterns are normal or improving:   Report abnormal assessment to physician   Assess for normal bowel sounds   Monitor for abdominal discomfort   Administer treatments as ordered   Anticipate/assist with toileting needs   Monitor for abdominal distension   Assess for signs and symptoms of bleeding.  Report signs of bleeding to physician  Goal: Nutritional intake is adequate  Outcome: Progressing  Flowsheets (Taken 05/04/2019 0136 by Maple Mirza, RN)  Nutritional intake is adequate:   Assist patient with meals/food selection   Allow adequate time for meals   Encourage/perform oral hygiene as appropriate   Encourage/administer dietary supplements as ordered (i.e. tube feed, TPN, oral, OGT/NGT, supplements)

## 2019-05-04 NOTE — Assessment & Plan Note (Signed)
A/P: due to GI dysfunction   - prn meds, as above

## 2019-05-04 NOTE — Progress Notes (Signed)
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 Carmelina Noun & [facility] docs    Chronic abdominal pain  A/P: due to GI dysfunction   - prn meds, as above    Seizure   A/P:  chronic, no issues    - no home meds    Gastroesophageal reflux disease  A/P: no issues   - pepcid    GAD (generalized anxiety disorder)   A/P:  chronic, no issues    - home meds    Fibromyalgia   A/P:  chronic, no issues    - home meds    Crohn's disease  A/P: resolved with prior colectomy, NAD known   - observe    Chronic hepatitis C  A/P: no issues   - observe    Bipolar disorder   A/P:  chronic, no issues    - home meds    PCOS (polycystic ovarian syndrome)  A/P: no issues or home meds   - observe      Solon Palm, MD   [name] [phone], Pager #[phone]  [facility] ACCESS Program     Incidental findings:  none  Additional Info:  Care plan and expectations reviewed with Patient  Questions were answered.    Subjective/Chief Complaint & ROS:   Feeling better, still pain but improved  Having some BMs and flatus, nausea nearly resolved    24-hour interval history:  Admit, BMs    Meds:   docusate sodium, 100 mg, Oral, BID Meals  enoxaparin, 40 mg, Subcutaneous, Q24H  famotidine, 20 mg, Intravenous, Q12H SCH  fluticasone furoate-vilanterol, 1 puff, Inhalation, QAM  ketorolac, 15 mg, Intravenous, Q6H  pregabalin, 150 mg, Oral, TID  sodium chloride (PF), 3 mL, Intravenous,  Q8H  topiramate, 25 mg, Oral, Q12H SCH  traZODone, 300 mg, Oral, QHS  venlafaxine, 225 mg, Oral, Daily        Infusions:   lactated ringers 100 mL/hr at 05/03/19 2232        DVT Prophylaxis:  enoxaparin 40 mg SQ Daily and Actively ambulating > 20 ft. in the hall multiple times a day  Foley:  No          GI Prophylaxis:  Yes            BM last 24 Hours:  Yes   Bowel Regimen:  Yes    Diet:  Diet NPO effective now - Other; Other-  enter in Comments (sips of clears & meds/ice chips OK)     I & O's:  Data reviewed-not strict UO - x1  Pain: reasonably controlled  EXAM:   Vitals:  Blood pressure 115/69, pulse 79, temperature 97.5 F (36.4 C), temperature source Temporal, resp. rate 16, height 1.651 m (5\' 5" ), weight 58.1 kg (128 lb), last menstrual period 04/25/2019, SpO2 95 %.   General:   in no apparent distress, improved, appears comfortable  Pulmonary:   lungs clear to ausculation, equal breath sounds, unlabored respirations  Cardiac:   regular rate and rhythm without murmurs/rubs/gallops  Abdomen: mildly  hypoactive bowel sounds, soft, mild to moderate generalized tenderness without rebound and without guarding, non-distended  Neuro:   alert, no gross focal neurologic deficits, moves all extremities well, normal gait  Psych:   normal affect, insight good  Extremities:   no edema  HEENT:   normocephalic, atraumatic, sclera clear and anicteric, wearing a face mask  Skin:   no jaundice, normothermic    Lab Data Reviewed:  Yes - stable overall, lytes OK    Cultures:  none     CHEM:   Recent Labs   Lab 05/04/19  0420 05/03/19  0820   Glucose 85 107*   Sodium 140 143   Potassium 3.7 3.5   Chloride 110 109   CO2 24 21.0   BUN 7 6*   Creatinine 0.67 0.77   Calcium 8.4* 8.7   Magnesium 1.8  --    Phosphorus 2.6  --      CBC:     Recent Labs   Lab 05/04/19  0420 05/03/19  0820   WBC 6.1 9.1   Hemoglobin 12.8 14.4   Hematocrit 38.8 43.8   PLT CT 240 220   MCV 104* 100     BANDS:    Recent Labs   Lab 05/03/19  0820   Bands 2      POCT:      LFTs:    Recent Labs   Lab 05/03/19  0820   ALT 20   AST (SGOT) 20   Alkaline Phosphatase 86   Bilirubin, Total 0.5   Albumin 3.8     COAG:      Lactate:        Radiology:   No results found.

## 2019-05-05 DIAGNOSIS — R109 Unspecified abdominal pain: Secondary | ICD-10-CM

## 2019-05-05 DIAGNOSIS — K5989 Other specified functional intestinal disorders: Secondary | ICD-10-CM

## 2019-05-05 DIAGNOSIS — M797 Fibromyalgia: Secondary | ICD-10-CM

## 2019-05-05 DIAGNOSIS — F411 Generalized anxiety disorder: Secondary | ICD-10-CM

## 2019-05-05 DIAGNOSIS — F319 Bipolar disorder, unspecified: Secondary | ICD-10-CM

## 2019-05-05 DIAGNOSIS — G8929 Other chronic pain: Secondary | ICD-10-CM

## 2019-05-05 MED ORDER — FAMOTIDINE 20 MG PO TABS
20.0000 mg | ORAL_TABLET | Freq: Two times a day (BID) | ORAL | Status: DC
Start: 2019-05-05 — End: 2019-05-06
  Administered 2019-05-05 – 2019-05-06 (×2): 20 mg via ORAL
  Filled 2019-05-05 (×3): qty 1

## 2019-05-05 MED ORDER — OXYCODONE HCL 5 MG PO TABS
5.0000 mg | ORAL_TABLET | ORAL | Status: DC | PRN
Start: 2019-05-05 — End: 2019-05-06
  Administered 2019-05-05 – 2019-05-06 (×6): 5 mg via ORAL
  Filled 2019-05-05 (×7): qty 1

## 2019-05-05 MED ORDER — FAMOTIDINE 20 MG PO TABS
40.0000 mg | ORAL_TABLET | Freq: Two times a day (BID) | ORAL | Status: DC
Start: 2019-05-05 — End: 2019-05-05

## 2019-05-05 NOTE — Plan of Care (Addendum)
Problem: Altered GI Function  Goal: Elimination patterns are normal or improving  Outcome: Progressing  Goal: Nutritional intake is adequate  Outcome: Progressing     Problem: Moderate/High Fall Risk Score >5  Goal: Patient will remain free of falls  Outcome: Progressing         NURSE NOTE SUMMARY  [facility] - 4TH SURGICAL   Patient Name: [name],[name]   Attending Physician: Leory Plowman, MD   Today's date:   05/06/2019 LOS: 3 days   Shift Summary:                                                              Pt has been A&Ox4 and on RA. Pt denies nausea but reports 7-8/10 abdominal pain. Scheduled and PRN medication given. VSS. Call bell within reach and bed alarm set.      Provider Notifications:        Rapid Response Notifications:  Mobility:      PMP Activity: Step 7 - Walks out of Room (05/05/2019  9:30 PM)     Weight tracking:  Family Dynamic:   Last 3 Weights for the past 72 hrs (Last 3 readings):   Weight   05/03/19 1308 58.1 kg (128 lb)             Last Bowel Movement   Last BM Date: 05/04/19

## 2019-05-05 NOTE — Progress Notes (Signed)
Quick Doc  [facility] - 4TH SURGICAL   Patient Name: [name],[name]   Attending Physician: Solon Palm, MD   Today's date:   05/05/2019 LOS: 2 days   Expected Discharge Date      Quick  Assessment:                                                              ReAdmit Risk Score: 23    CM Comments: 05/05/19 RNCM (S.R.) Adm for diffuse dysfunction of sm muscle GI tract.  Chart reviewed and collaborate with primary RN.  Strict I&O. Pain mgmt. Labs. Incont of bm.  Change to gastrectomy diet. Possible d/c home today and anticipate no d/c needs.    Physical Discharge Disposition: Home                                                                               Provider Notifications:        Richardean Sale RN,BSN  Case Management  Phone number: [phone]

## 2019-05-05 NOTE — Consults (Signed)
Nutrition Education      Purpose of Education: A low-fiber diet includes limited amounts of foods that your body cannot digest. This diet should help you slow the movement of food in your intestines and lower the amount and bulk of your stool. It may also help with your diarrhea, stomach pain, gas and bloating.    Primary Modifications: Eat about 5 to 6 small meals every 3 or 4 hours daily. Do not eat whole grains, seeds, fruit and vegetable peels or skins, whole nuts, raw vegetables, raw fruits, and the connective tissues of meats. Drink at least eight cups of fluids a day.     Supplement with Ensure/Boost shakes with less than 2 grams of fiber per serving. Written guidelines provided for home reference.

## 2019-05-05 NOTE — Plan of Care (Addendum)
NURSE NOTE SUMMARY  [facility] - 4TH SURGICAL   Patient Name: [name],[name]   Attending Physician: Solon Palm, MD   Today's date:   05/05/2019 LOS: 2 days   Shift Summary:                                                              Assumed care of pt   Pt A&Oox4 VSS pleasant and cooperative   Per pt she is having episodes of incontinence of BM in bed, pt was assisted to changed and provided supplies to clean up, pt passing gas adequately   IV c/d/I infusing per MAR    Pain controlled successfully with prns     Call bell within reach no further complaints at this time    Provider Notifications:        Rapid Response Notifications:  Mobility:      PMP Activity: Step 6 - Walks in Room (05/04/2019  7:43 PM)     Weight tracking:  Family Dynamic:   Last 3 Weights for the past 72 hrs (Last 3 readings):   Weight   05/03/19 1308 58.1 kg (128 lb)             Last Bowel Movement   Last BM Date: 05/04/19       Problem: Altered GI Function  Goal: Elimination patterns are normal or improving  Outcome: Progressing  Goal: Nutritional intake is adequate  Outcome: Progressing     Problem: Moderate/High Fall Risk Score >5  Goal: Patient will remain free of falls  Outcome: Progressing

## 2019-05-05 NOTE — Progress Notes (Signed)
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 Carmelina Noun & [facility] docs    Chronic obstructive pulmonary disease   A/P:  chronic, no issues    - home meds    Chronic abdominal pain  A/P: due to GI dysfunction   - prn meds, as above    Seizure   A/P:  chronic, no issues    - no home meds    Gastroesophageal reflux disease  A/P: no issues   - pepcid    GAD (generalized anxiety disorder)   A/P:  chronic, no issues    - home meds    Fibromyalgia   A/P:  chronic, no issues    - home meds    Crohn's disease  A/P: resolved with prior colectomy, NAD known   - observe    Chronic hepatitis C  A/P: no issues   - observe    Bipolar disorder   A/P:  chronic, no issues    - home meds    PCOS (polycystic ovarian syndrome)  A/P: no issues or home meds   - observe    Cyrus later today versus tomorrow - dietary education    Solon Palm, MD   [name] [phone], Pager #[phone]  [place] [facility] [facility] ACCESS Program     Incidental findings:  none  Additional Info:  Care plan and expectations reviewed with Patient  Questions were answered.    Subjective/Chief Complaint & ROS:   Feeling better, less pain  BMs and flatus,concerned re: fecal incontinence overnight and unaware    24-hour interval history:  BMs    Meds:   enoxaparin, 40 mg, Subcutaneous, Q24H  famotidine, 20 mg, Intravenous, Q12H SCH  fluticasone furoate-vilanterol, 1 puff, Inhalation,  QAM  ketorolac, 15 mg, Intravenous, Q6H  pregabalin, 150 mg, Oral, TID  sodium chloride (PF), 3 mL, Intravenous, Q8H  topiramate, 25 mg, Oral, Q12H SCH  traZODone, 300 mg, Oral, QHS  venlafaxine, 225 mg, Oral, Daily        Infusions:       DVT Prophylaxis:  enoxaparin 40 mg SQ Daily and Actively ambulating > 20 ft. in the hall multiple times a day  Foley:  No          GI Prophylaxis:  Yes            BM last 24 Hours:  Yes   Bowel Regimen:  Yes    Diet:  Diet post-op solids     I & O's:  Data reviewed-not strict BM - several  Pain: reasonably controlled  EXAM:   Vitals:  Blood pressure 112/79, pulse 82, temperature 97.9 F (36.6 C), temperature source Temporal, resp. rate 16, height 1.651 m (5\' 5" ), weight 58.1 kg (128 lb), last menstrual period 04/25/2019, SpO2 96 %.   General:   in no apparent distress, improved, appears comfortable  Pulmonary:   lungs clear to ausculation, equal breath sounds, unlabored respirations  Cardiac:   regular rate and rhythm without murmurs/rubs/gallops  Abdomen:  active bowel sounds, soft, minimal generalized tenderness without rebound and without guarding, non-distended  Neuro:   alert, no gross focal neurologic deficits, moves all extremities well, normal gait  Psych:   normal affect, insight good  HEENT:   normocephalic, atraumatic, sclera clear and anicteric, wearing a face mask    Lab Data Reviewed:  Yes - no new    Cultures:  none     CHEM:     Recent Labs   Lab 05/04/19  0420 05/03/19  0820   Glucose 85 107*   Sodium 140 143   Potassium 3.7 3.5   Chloride 110 109   CO2 24 21.0   BUN 7 6*   Creatinine 0.67 0.77   Calcium 8.4* 8.7   Magnesium 1.8  --    Phosphorus 2.6  --      CBC:       Recent Labs   Lab 05/04/19  0420 05/03/19  0820   WBC 6.1 9.1   Hemoglobin 12.8 14.4   Hematocrit 38.8 43.8   PLT CT 240 220   MCV 104* 100     BANDS:    Recent Labs   Lab 05/03/19  0820   Bands 2     POCT:      LFTs:      Recent Labs   Lab 05/03/19  0820   ALT 20   AST (SGOT) 20   Alkaline  Phosphatase 86   Bilirubin, Total 0.5   Albumin 3.8     COAG:      Lactate:        Radiology:   No results found.

## 2019-05-05 NOTE — UM Notes (Signed)
[place] [employer]    [facility]  Utilization Intake Form             Date 05/05/2019               AUTHORIZATION #__________________________________     Patient type (circle) XInpatient   Outpatient   Observation     Admission Type:  Emergent    Patient Name: [name]    Address: [address]  [place] [address]    Telephone#: [phone]    Medicaid #: [id number]    SS#: [id number]    DOB: [birth year]    Admitting MD: Guy Franco, MD    Admit Date: 05/03/2019  1:02 PM    Hospital:  [facility], [address]  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  [place], [place] [address]  Work: [phone]  Fax: [phone]  [email or web address]          Fax completed form to [facility] at [phone]  To IT trainer for [employer] at [phone]  _____________________________________________________________________

## 2019-05-05 NOTE — UM Notes (Signed)
[facility] Utilization Management Review Sheet    Facility :  [facility]    NAME: [name]  MR#: [id number]  DOB: 1985/06/29  CSN#: [id number]  ROOM: 463/463-A AGE: 34 y.o.    ADMIT DATE AND TIME: 05/03/2019  1:02 PM  PATIENT CLASS: INPATIENT 05/03/19 1743    ATTENDING PHYSICIAN: Solon Palm, MD  PAYOR:Payor: MEDICARE [employer] / Plan: [place] [employer] / Product Type: MANAGED MEDICARE /     MCG; M-210  DIAGNOSIS:     ICD-10-CM    1. 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      HPI:   34 y.o. female presents with generalized abdominal pain.    The pain is described as cramping, sharp and stabbing, moderate to severe, and has been constant and waxing and waning in intensity.  The pain is without radiation.    The pain began 2-3 days ago and has waxed and waned but are worse overall.  Associated symptoms include bloating, constipation, nausea and vomiting. Alleviating factors include none. She has had similar episodes in the past.  Her only unusual dietary change was Dione Plover just prior.  Emesis is dark brown and she has not taken lomotil in some time.    IMPRESSION:   34 y.o. 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  This is functional and not correctable by surgery.  Unfortunately her correct diagnosis has never been entered into the chart but she has begun  outpatient evaluation to aid her with this problem.    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    PLAN:   Admit to Surgical floor.  NPO, no NGT - hydrate, bowel regimen, pepcid; pain control  Home meds  R/S colonoscopy after discharge with [facility] (missed 3/30 for transportation issues) to rule out ileorectal anastomosis stricture  Keep appt at [facility] in June with GI doc     Radiology:   CT Abdomen Pelvis with IV Cont  Result Date: 05/03/2019  1.  There are multiple dilated loops of small and large bowel, similar in configuration to prior study. No discrete transition point from dilated to collapsed bowel is seen. Findings are suspected to represent ileus. Partial or intermittent  obstruction could have a similar appearance. Bowel wall thickening and mucosal hyperenhancement suggestive of enteritis on prior examination are no longer present. 2.  Postsurgical changes of prior partial small bowel resection are noted with multiple anastomotic suture lines. 3.  There are small nonobstructive left renal stones. 4.  Prior cholecystectomy    Meds:   docusate sodium, 100 mg, Oral, BID Meals  enoxaparin, 40 mg, Subcutaneous, Q24H  famotidine, 20 mg, Intravenous, Q12H SCH  fluticasone furoate-vilanterol, 1 puff, Inhalation, QAM  ketorolac, 15 mg, Intravenous, Q6H  pregabalin, 150 mg, Oral, TID  sodium chloride (PF), 3 mL, Intravenous, Q8H  topiramate, 25 mg, Oral, Q12H SCH  traZODone, 300 mg, Oral, QHS  venlafaxine, 225 mg, Oral, Daily    Infusions:  LR 125ML/HR IV    PRN MEDS: CLONAZEPAM 0.5MG  PO BID PRN X1; DILAUDID 1MG  IV  Q2HR PRN X4; ZANAFLEX 8MG  PO Q8HR PRN X1; INDERAL 20MG  PO TID PRN X1 ANXIETY     VITAL SIGNS: T 97.63F HR 98 BP 98/57-117/78 RR 16 SAT 92%    LABS: GLU 107; BUN 6;    DATE OF REVIEW: 05/04/2019    SURGERY:   ASSESSMENT & PLAN:                                                              Hospital Day: 2      CONDITION:    gradually improving    Patient Active Hospital Problem List:  * 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 Carmelina Noun & [facility] docs    Chronic abdominal pain  A/P: due to GI dysfunction          - prn meds, as above    Seizure   A/P:  chronic, no issues          - no home meds    Gastroesophageal reflux disease  A/P: no issues          - pepcid    GAD (generalized anxiety disorder)   A/P:  chronic, no issues          - home meds    Fibromyalgia   A/P:  chronic, no issues          - home meds    Crohn's disease  A/P: resolved with prior colectomy, NAD known          - observe    Chronic hepatitis C  A/P: no issues          - observe    Bipolar disorder   A/P:  chronic, no issues          - home meds    PCOS (polycystic ovarian syndrome)  A/P: no issues or home meds          - observe    Subjective/Chief Complaint & ROS:   Feeling better, still pain but improved  Having some BMs and flatus, nausea nearly resolved    24-hour interval history:  Admit, BMs    Meds:   docusate sodium, 100 mg, Oral, BID Meals  enoxaparin, 40 mg, Subcutaneous,  Q24H  famotidine, 20 mg, Intravenous, Q12H SCH  fluticasone furoate-vilanterol, 1 puff, Inhalation, QAM  ketorolac, 15 mg, Intravenous, Q6H  pregabalin, 150 mg, Oral, TID  sodium chloride (PF), 3 mL, Intravenous, Q8H  topiramate, 25 mg, Oral, Q12H SCH  traZODone, 300 mg, Oral, QHS  venlafaxine, 225 mg, Oral, Daily      Infusions:   lactated ringers 100 mL/hr at 05/03/19 2232     PRN MEDS: PRN MEDS: CLONAZEPAM 0.5MG  PO BID PRN X2;  DILAUDID 1MG  IV Q2HR PRN X8; ZANAFLEX 8MG  PO Q8HR PRN X2;     VITAL SIGNS: T 98.46F HR 83 BP 108/65-120/75 RR 16 SAT 94%    LABS: RBC 3.74;  CA 8.4;     Trevor Iha, RN, BSN  Utilization Management  [facility]  [address]  [place], [place] [address]  Work: [phone]  Fax: [phone]  [email or web address]

## 2019-05-05 NOTE — Assessment & Plan Note (Signed)
A/P:  chronic, no issues    - home meds

## 2019-05-05 NOTE — Progress Notes (Signed)
Pharmacy IV to PO Conversion Note    Previous IV dose: Famotidine 20 mg IV Q12H  New oral dose: Famotidine 20 mg PO Q12H    1.  The patient met the following conversion criteria and can receive oral therapy.   Able to eat or tolerate enteral feeding OR  Receiving enteral nutrition by the oral, gastric, or nasogastric tube OR  Receiving other scheduled medications by the oral route    2.  Patient does not have any of the following:   Inability to swallow, refuses oral medication or is strict NPO  Malabsorption syndrome  Unresolved ileus or complete bowel obstruction  Active GI bleed (pantoprazole and famotidine only)  Seizures (lorazepam only)  Myxedema coma (levothyroxine only)  Organ donation (levothyroxine only)    If you have any questions, please contact the pharmacist at [phone].    Herron Fero R. Arely Tinner, PharmD, BCPS  [facility]acy Department

## 2019-05-06 MED ORDER — BUPRENORPHINE HCL-NALOXONE HCL 2-0.5 MG SL SUBL
1.00 | SUBLINGUAL_TABLET | Freq: Two times a day (BID) | SUBLINGUAL | Status: DC
Start: 2019-05-06 — End: 2019-05-06
  Filled 2019-05-06: qty 1

## 2019-05-06 MED ORDER — DOCUSATE SODIUM 100 MG PO CAPS
100.00 mg | ORAL_CAPSULE | Freq: Every day | ORAL | Status: DC
Start: 2019-05-06 — End: 2019-05-06
  Filled 2019-05-06: qty 1

## 2019-05-06 MED ORDER — DSS 100 MG PO CAPS
100.00 mg | ORAL_CAPSULE | Freq: Two times a day (BID) | ORAL | Status: DC | PRN
Start: 2019-05-06 — End: 2019-11-10

## 2019-05-06 MED ORDER — BUPRENORPHINE HCL-NALOXONE HCL 8-2 MG SL SUBL
1.00 | SUBLINGUAL_TABLET | Freq: Two times a day (BID) | SUBLINGUAL | Status: DC
Start: 2019-05-06 — End: 2019-05-06

## 2019-05-06 NOTE — Discharge Instructions (Signed)
Small Bowel Obstruction     Small bowel obstruction can lead to tissue damage and even tissue death.   A small bowel obstruction occurs when part or all of the small intestine (bowel) is blocked. As a result, digestive contents can’t move through the bowel and out of the body correctly. Treatment is needed right away to remove the blockage. This can ease painful symptoms. It can also prevent serious problems, such as tissue death or bursting (rupture) of the small bowel. Without treatment, a small bowel obstruction can be fatal.  Causes of small bowel obstruction  A small bowel obstruction can be caused by:  · Scar tissue (adhesions). These may form after belly (abdominal) surgery or an infection.  · Hernia. A hernia is when an organ pushes through a weak spot or tear in the abdomen wall. Part of the small bowel can push out and be seen as a bulge under the belly. Hernias can also occur internally.  · Certain health problems. These include when part of the bowel slides inside another part (intussusception). Other causes include irritable bowel disease such as Crohn’s disease, and inflammation and sores in the intestine (ulcerative colitis).  · Abnormal tissue growths (tumors). These can form on the inside or outside of the small bowel. They are usually due to cancer.  Symptoms of small bowel obstruction  Common symptoms include:  · Belly cramping and pain  · Belly swelling and bloating  · Upset stomach (nausea) and vomiting  · Can't  pass gas  · Can't pass stool (constipation)  · Diarrhea  Diagnosing small bowel obstruction  Your provider will ask about your symptoms and health history. You’ll also have a physical exam. Tests may also be done to confirm the problem. These can include:  · Imaging tests. These provide pictures of the small bowel. Common tests include X-rays and a CT scan.  · Blood tests. These check for infection and other problems, such as excess fluid loss (dehydration).  · Upper GI  (gastrointestinal) series with a small bowel follow-through. This test takes X-rays of the upper digestive tract from the mouth through the small bowel. An X-ray dye (contrast fluid) is used. The dye coats the inside of your upper digestive tract so it will show up clearly on X-rays.  Treating small bowel obstruction  Treatment takes place in a hospital. As part of your care, the following may be done:  · No food or drink is given by mouth. This allows your bowels to rest.  · An IV (intravenous) line is placed in a vein in your arm or hand. The IV line is used to give fluids. It may also be used to give medicines. These may be needed to ease pain, nausea, and other symptoms. They may also be needed to treat or prevent infections.  · A soft, thin, flexible tube (nasogastric tube) is inserted through your nose and into your stomach. The tube is used to remove extra gas and fluid in your stomach and bowels. This helps to ease symptoms such as pain and swelling.  · In some cases, surgery is done. This may be needed if the small bowel is almost or totally blocked, if symptoms continue even after treatment, or if there is a hole in the bowel (bowel perforation). During surgery, the blockage is removed. Parts of the bowel may also be removed if there is tissue death. Other repair may be done as well, depending on what caused the blockage. Your healthcare provider will give you more information about surgery, if needed.  ·   You’ll be watched closely in the hospital until your symptoms improve. Your provider will tell you when you can go home.  Long-term concerns   After treatment, most people recover with no lasting effects. If a long part of the bowel is removed, there is a greater chance for lifelong digestive problems. Bowel movements may become irregular. Work with your provider to learn the best ways to manage any symptoms you may have, and to protect your health.  When to call your healthcare provider  Call your  provider right away if you have any of the following:  · Severe pain (call 911)  · Belly swelling or cramping that won’t go away  · Can’t pass stool or gas  · Nausea or vomiting (especially if the vomit looks or smells like stool)  [employer] last reviewed this educational content on 06/23/2017  © 2000-2020 [employer] [address], [place] [address]. All rights reserved. This information is not intended as a substitute for professional medical care. Always follow your healthcare professional's instructions.        Low-Fiber Diet     Eggs are high in protein and easy to digest.   Eating a low-fiber diet means eating foods that don’t have much fiber. These foods are easy to digest.   Most of the fiber that you eat passes undigested through your bowel. This is what forms stool. Low-fiber foods can help to slow down your bowel movements. When you eat a low-fiber diet, you have fewer stools. This lets your intestine rest.   Your healthcare provider will tell you how long you need to be on this diet. It may only be for a short time. Low-fiber foods often don’t give you all the nutrients you need to stay healthy. Your healthcare provider may have you take certain vitamins while you are on this diet.   Reasons to eat a low-fiber diet  The goal of a low-fiber diet is to limit the size and number of your stools. It may be prescribed if you:   · Are going through chemotherapy or radiation treatments  · Have had intestinal surgery  · Have trouble digesting food  · Have a condition that affects your intestine, such as diarrhea, irritable bowel syndrome, Crohn’s disease, ulcerative colitis, or diverticulitis  General guidelines for a low-fiber diet  In general, a low-fiber diet means having fewer than 13 grams of fiber a day. Your healthcare provider may give you a list of things you can and can’t eat or drink. Read food labels. Choose foods and drinks that have as close to zero grams of fiber as possible.  Here are general guidelines to follow:   Breads, pasta, cereal, rice, and other starches (6 to 11 servings daily)  · What to choose: white bread, biscuits, muffins, and white rolls; plain crackers; waffles; white pasta; white rice; cream of wheat; grits; white pancakes; corn flakes; cooked potatoes without skin; pretzels. Fiber content of these foods should be less than 0.5 (1/2) gram per serving.  · What to pass up: whole-wheat or whole-grain breads, crackers, and pasta; breads with seeds or nuts; wheat germ; graham crackers; cornbread; wild or brown rice; cereals with whole-grain, bran, and granola; cereals with seeds, nuts, coconut, or dried fruit; potatoes with skin  Milk and dairy (2 servings daily)  · What to choose: milk, buttermilk; yogurt or ice cream without seeds or nuts; custard or pudding; sour cream; cheese and cottage cheese; cream sauces, soups, and casseroles  · What to pass up: ice   cream and yogurt with seeds, nuts, or fruit chunks  Fruit (2 to 4 servings daily)  · What to choose: ripe banana; ripe nectarine, peach, apricot, papaya, and plum; soft honeydew melon and cantaloupe; cooked or canned fruit without skin or seeds (not sweetened with sorbitol); applesauce; strained fruit juice (without pulp)  · What to pass up: raw or dried fruit; all berries; raisins; canned and raw pineapple; prunes and prune juice; fruit juice with pulp  Vegetables (3 to 5 servings daily)  · What to choose: well-cooked or canned vegetables without seeds, such as spinach, eggplant, green and wax beans, carrots, yellow squash, pumpkin; lettuce on a sandwich  · What to pass up: all raw or steamed vegetables; vegetables with seeds, such as unstrained tomato sauce; green peas; lima beans; broccoli; corn; parsnips  Meats and protein (4 to 6 ounces daily)  · What to choose: tender, well-cooked meat, including ground meat, poultry, and fish; eggs; tofu; creamy peanut butter  · What to pass up: processed meats such as hot dogs  and sausages, tough, chewy meat with gristle; peas, including split, yellow, and black-eyed; beans, including navy, lima, black, garbanzo, soy, pinto, and lentil; peanuts and crunchy peanut butter   Fats, oils, sauces, condiments (fewer than 8 teaspoons daily)  · What to choose: butter, margarine, oils, whipped cream, sour cream, mayonnaise, smooth dressings and sauces; plain gravy; smooth condiments  · What to pass up: dressing with seeds or fruit chunks; pickles and relishes  Other foods and drinks  · What to choose: water; plain gelatin; plain puddings; pretzels; plain cookies and cakes; honey, syrup; decaffeinated drinks, including tea and coffee    · What to pass up: popcorn; potato chips; spicy foods; fried, greasy foods; alcohol (ask your healthcare provider); marmalade, jam, and preserves; desserts that have seeds, nuts, coconut, dried fruit, whole grains, or bran; candy that has seeds or nuts; drinks sweetened with sorbitol or other sugar substitutes; caffeinated drinks, including tea, coffee, soda, and energy drinks    [employer] last reviewed this educational content on 05/24/2018  © 2000-2020 [employer]. [address], [place] [address]. All rights reserved. This information is not intended as a substitute for professional medical care. Always follow your healthcare professional's instructions.

## 2019-05-06 NOTE — Plan of Care (Addendum)
NURSE NOTE SUMMARY  [facility] - 4TH SURGICAL   Patient Name: [name],[name]   Attending Physician: Leory Plowman, MD   Today's date:   05/06/2019 LOS: 3 days   Shift Summary:                                                              Assumed care of pt   Pt A&Ox4 VSS, pt complaining of pain, pain successfully controlled with prns   Pt ambulating independently     1230 Discharge instructions reviewed with patient pt verbalized understanding     No further complaints at this time      Provider Notifications:        Rapid Response Notifications:  Mobility:      PMP Activity: Step 7 - Walks out of Room (05/06/2019  8:00 AM)     Weight tracking:  Family Dynamic:   Last 3 Weights for the past 72 hrs (Last 3 readings):   Weight   05/03/19 1308 58.1 kg (128 lb)             Last Bowel Movement   Last BM Date: 05/06/19       Problem: Altered GI Function  Goal: Elimination patterns are normal or improving  Outcome: Progressing  Goal: Nutritional intake is adequate  Outcome: Progressing     Problem: Moderate/High Fall Risk Score >5  Goal: Patient will remain free of falls  Outcome: Progressing

## 2019-05-06 NOTE — Progress Notes (Signed)
Quick Doc  [facility] - 4TH SURGICAL   Patient Name: [name],[name]   Attending Physician: Leory Plowman, MD   Today's date:   05/06/2019 LOS: 3 days   Expected Discharge Date      Quick  Assessment:                                                              ReAdmit Risk Score: 23    CM Comments: 05/06/19 SW RJ: Pt discharging home today with family support. No DME/HH needs. Family to transport.    Physical Discharge Disposition: (P) Home                                   Mode of Transportation: (P) Car     Patient/Family/POA notified of transfer plan: (P) Patient informed only  Patient agreeable to discharge plan/expected d/c date?: (P) Yes     Bedside nurse notified of transport plan?: (P) Yes            Provider Notifications:           Harolyn Rutherford, MSW  Social Worker  [phone]

## 2019-05-06 NOTE — Progress Notes (Signed)
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 Carmelina Noun & [facility] docs    Chronic obstructive pulmonary disease   A/P:  chronic, no issues    - home meds    Seizure   A/P:  chronic, no issues    - no home meds    Gastroesophageal reflux disease  A/P: no issues   - pepcid    GAD (generalized anxiety disorder)   A/P:  chronic, no issues    - home meds    Fibromyalgia   A/P:  chronic, no issues    - home meds    Crohn's disease  A/P: resolved with prior colectomy, NAD known   - observe    Chronic hepatitis C  A/P: no issues   - observe    Bipolar disorder   A/P:  chronic, no issues    - home meds    PCOS (polycystic ovarian syndrome)  A/P: no issues or home meds   - observe    Tolerating soft diet, having BMs, ready to [place] home?    Theotis Burrow, NP   X [phone]  [facility] ACCESS Program     Incidental findings:  none  Additional Info:  Care plan and expectations reviewed with Patient  Questions were answered.       I have reviewed the notes, assessments, and/or procedures performed by Cristi Loron  , I concur with her/his documentation of [name].      Doing well clinically.  Tolerating PO diet, and has ROBF.  Ok to d/c home today.    Terrill Mohr MD  ACCESS (Acute Care and Emergency Surgery Service)  [facility]  Phone # [phone]  Pager # [phone]        Subjective/Chief Complaint & ROS:    Resting in bed, still with abdominal pain and intermittent nausea, mildly increased from baseline.  Tolerating diet, +BMs  Asking for pain medication for discharge    24-hour interval history:  Diet readvanced    Meds:   enoxaparin, 40 mg, Subcutaneous, Q24H  famotidine, 20 mg, Oral, Q12H  fluticasone furoate-vilanterol, 1 puff, Inhalation, QAM  ketorolac, 15 mg, Intravenous, Q6H  pregabalin, 150 mg, Oral, TID  sodium chloride (PF), 3 mL, Intravenous, Q8H  topiramate, 25 mg, Oral, Q12H SCH  traZODone, 300 mg, Oral, QHS  venlafaxine, 225 mg, Oral, Daily  Infusions:       DVT Prophylaxis:  enoxaparin 40 mg SQ Daily and SCDs  Foley:  No          GI Prophylaxis:  Yes            BM last 24 Hours:  Yes   Bowel Regimen:  No    Diet:  Diet post-op solids  ENSURE COMPACT SUPPLEMENT Quantity: B. Two; Frequency: BID (2 times a day) - with Breakfast and Dinner Please send two vanilla compacts with breakfast and dinner.  Boost Pudding Quantity: A. One; Frequency: Daily with lunch Please send two vanilla Boost puddings with lunch     I & O's:  Data reviewed UO - x5, BM - x1  Pain: well controlled  EXAM:   Vitals:  Blood pressure 131/74, pulse (!) 55, temperature 97.9 F (36.6 C), temperature source Temporal, resp. rate 16, height 1.651 m (5\' 5" ), weight 58.1 kg (128 lb), last menstrual period 04/25/2019, SpO2 98 %.   General:   in no apparent distress, appears comfortable, non-toxic  Pulmonary:   lungs clear to ausculation, equal breath sounds, unlabored respirations  Cardiac:   regular rate and rhythm without murmurs/rubs/gallops  Abdomen:  active bowel sounds, soft, mild suprapubic, RLQ and LLQ tenderness without rebound and without guarding, non-distended  Neuro:   alert, speech normal, oriented x 3, no gross focal neurologic deficits, moves all extremities well  Psych:   cooperative and calm  HEENT:   normocephalic    Lab Data Reviewed:  Yes - no new    Cultures:  none     CHEM:     Recent Labs   Lab 05/04/19  0420  05/03/19  0820   Glucose 85 107*   Sodium 140 143   Potassium 3.7 3.5   Chloride 110 109   CO2 24 21.0   BUN 7 6*   Creatinine 0.67 0.77   Calcium 8.4* 8.7   Magnesium 1.8  --    Phosphorus 2.6  --      CBC:       Recent Labs   Lab 05/04/19  0420 05/03/19  0820   WBC 6.1 9.1   Hemoglobin 12.8 14.4   Hematocrit 38.8 43.8   PLT CT 240 220   MCV 104* 100     BANDS:    Recent Labs   Lab 05/03/19  0820   Bands 2     POCT:      LFTs:      Recent Labs   Lab 05/03/19  0820   ALT 20   AST (SGOT) 20   Alkaline Phosphatase 86   Bilirubin, Total 0.5   Albumin 3.8     COAG:      Lactate:        Radiology:   No results found.

## 2019-05-06 NOTE — UM Notes (Signed)
UTILIZATION REVIEW DISCHARGE NOTE      [name],[name]  December 13, 1985      AUTH# [id number]      TIME/DATE OF DISCHARGE: 05/06/2019  1:51 PM      DISPOSITION: HOME      Trevor Iha, RN, BSN  Utilization Management  [facility]  [address]  [place], [place] [address]  Work: [phone]  Fax: [phone]  [email or web address]

## 2019-05-06 NOTE — Discharge Instr - AVS First Page (Signed)
Diet:  Low residue diet x 1-2 weeks then advance as tolerated to previous diet.    Activity:  Encourage ambulation as tolerated.  May use stairs if needed.    Medications:  Refer to the After Visit Summary for updates and new medications.  Take over-the-counter  Tylenol (aka acetaminophen) as needed for mild to moderate pain or headache.  Use over-the-counter stool softeners and/or Senokot daily to twice daily as needed for constipation.    Instructions:  Call/Return if temperature over 101.0, refractory nausea/vomiting, increased pain not relieved by medications, or new onset of chest pain or shortness or breath.    For additional questions or concerns after your discharge, you may contact the ACCESS office at [phone] between the hours of 8:00am - 4:30pm.  Any medication refill requests should be called to the office during daytime business hours.  If you have an urgent need that occurs after office hours, call [phone] and ask to speak to the ACCESS physician on call.  No medication refills will be handled outside of business hours.  Emergencies should go to the Emergency Room and cannot be handled over the phone.    ACCESS BOWEL MAINTENANCE GUIDELINES    The use of narcotic pain medications and decreased activity will lead to constipation.  In order to prevent this from happening to you, please follow these recommendations:  1. Drink adequate amounts of fluid throughout the day.    2. Take an over-the-counter stool softener, such as Colace, once or twice a day depending on the firmness of your stools.  3. You may take another over-the-counter stool softener, such as Senokot or generic Senna, two tablets at bedtime.  4. Add Metamucil or other similar fiber supplement to your diet twice a day.    If constipation persists:   1. Add Milk of Magnesia two (2) tablespoons (30mL) every 6    hours (up to 8 doses).  2. If you are experiencing rectal fullness or pressure, try a Dulcolax and/or   glycerin  suppository every 12 hours.     Avoid anti-diarrhea medications once your bowels begin to move.  Loose         stools should resolve on their own after 2-3 days.   Gas relief medications such as Gas-X or Gaviscon are acceptable for use.     Probiotic food products, such as yogurt, are acceptable especially if you are or have been on antibiotics.    Additional Recipe for Success (Natural Stool Softener):    -1 cup of unsweetened applesauce,   - cup of unsweetened prune juice,   -1 cup of unprocessed bran (ex., Millers brand - can be found at  [employer], [place],        [place], or other health food stores).    Mix together and store in refrigerator.    Take 2 tablespoons with a large glass of water daily and expect results 6-8 hours later.  You may add 1 tablespoon every 5-7 days if needed.   *can be used as long as desired, non-addictive, & gentle

## 2019-05-06 NOTE — Plan of Care (Signed)
Problem: Altered GI Function  Goal: Elimination patterns are normal or improving  05/06/2019 0805 by Glennon Mac, Jasmine Pang, RN  Outcome: Progressing  05/06/2019 0803 by Glennon Mac, Jasmine Pang, RN  Outcome: Progressing  Goal: Nutritional intake is adequate  05/06/2019 0805 by Glennon Mac, Jasmine Pang, RN  Outcome: Progressing  05/06/2019 0803 by Glennon Mac, Jasmine Pang, RN  Outcome: Progressing     Problem: Moderate/High Fall Risk Score >5  Goal: Patient will remain free of falls  05/06/2019 0805 by Glennon Mac, Jasmine Pang, RN  Outcome: Progressing  05/06/2019 0803 by Glennon Mac, Jasmine Pang, RN  Outcome: Progressing

## 2019-05-06 NOTE — Discharge Summary (Signed)
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     Brief Presentation History:  Refer to admission H&P for complete admission details.    34 y.o. female presents with generalized abdominal pain.    The pain is described as cramping, sharp and stabbing, moderate to severe, and has been constant and waxing and waning in intensity.  The pain is without radiation.    The pain began 2-3 days ago and has waxed and waned but are worse overall.  Associated symptoms include bloating, constipation, nausea and vomiting. Alleviating factors include none. She has had similar episodes in the past.  Her only unusual dietary change was Dione Plover just prior.  Emesis is dark brown and she has not taken lomotil in some time.  Initial evaluation included a CT scan of  the abdomen/pelvis to determine the diagnosis of dilated loops of bowel, possible ileus, partial or intermittent obstruction, or enteritis.    Hospital Course:      Admitted to surgical service with GI dysfunction and managed non operatively.  The patient's symptoms gradually improved therefore she was able to tolerate diet readvancement and experienced return of bowel function.  The dietitian was consult for low fiber diet education.  At this time, the patient's vital signs are stable and she is afebrile; she is tolerating a regular diet; her pain is controlled on pain medication by mouth; she is out of bed without problem and stable for discharge to home with family.      Operative and Other Procedures:   None    Cultures/Pathology:     Microbiology Results     None        Consultations:  None    Condition:  Good    Discharge Meds:     Current Discharge Medication List      START taking these medications    Details   docusate sodium (COLACE) 100 MG capsule Take 1 capsule (100 mg total) by  mouth 2 (two) times daily as needed for Constipation         CONTINUE these medications which have NOT CHANGED    Details   albuterol sulfate HFA (PROVENTIL) 108 (90 Base) MCG/ACT inhaler Inhale 2 puffs into the lungs every 4 (four) hours as needed for Wheezing or Shortness of Breath  Qty: 1 Inhaler, Refills: 2    Associated Diagnoses: Simple chronic bronchitis      clonazePAM (KlonoPIN) 0.5 MG tablet Take 0.5 mg by mouth 3 (three) times daily as needed for Anxiety         diphenoxylate-atropine (LOMOTIL) 2.5-0.025 MG per tablet Take 1 tablet by mouth 4 (four) times daily as needed for Diarrhea      fluticasone-salmeterol (ADVAIR HFA) 230-21 MCG/ACT inhaler Inhale 2 puffs into the lungs 2 (two) times daily  Qty: 12 g, Refills: 2    Associated Diagnoses: Simple chronic bronchitis      ondansetron (Zofran ODT) 4 MG disintegrating tablet Take 1 tablet (4 mg total) by mouth every 8 (eight) hours as needed for Nausea  Qty: 30 tablet,  Refills: 0    Associated Diagnoses: Nausea      pregabalin (LYRICA) 150 MG capsule Take 150 mg by mouth 3 (three) times daily         propranolol (INDERAL) 20 MG tablet Take 20 mg by mouth 3 (three) times daily         TiZANidine (ZANAFLEX) 4 MG capsule Take 8 mg by mouth 3 (three) times daily as needed      topiramate (TOPAMAX) 25 MG tablet Take 25 mg by mouth every 12 (twelve) hours         traZODone (DESYREL) 300 MG tablet Take 300 mg by mouth nightly      !! venlafaxine (EFFEXOR-XR) 150 MG 24 hr capsule Take 150 mg by mouth daily      !! venlafaxine (EFFEXOR-XR) 75 MG 24 hr capsule Take 75 mg by mouth daily      naloxone (NARCAN) 4 MG/0.1ML nasal spray 1 spray intranasally. If pt does not respond or relapses into respiratory depression call 911. Give additional doses every 2-3 min.  Qty: 2 each, Refills: 0    Comments: Dispense #1 twin pack       !! - Potential duplicate medications found. Please discuss with provider.          Follow-up:   [facility]   PCP - 1-2 weeks    Future Appointments   Date Time Provider Department Center   05/19/2019  2:00 PM Kathi Simpers, NP [facility]       Patient Instructions:                              SBO and low fiber diet education in AVS    Theotis Burrow, NP   [facility] [place] ACCESS Program     The time spent to complete this discharge and involving patient care today took less than 30 minutes.      I have reviewed the notes, assessments, and/or procedures performed by Cristi Loron  , I concur with her/his documentation of Briauna Xoie Windecker.      Terrill Mohr MD  [facility] (Acute Care and Emergency Surgery Service)  [facility]  Phone # [phone]  Pager # [phone]

## 2019-05-12 ENCOUNTER — Emergency Department: Payer: Medicare Managed Care Other

## 2019-05-12 ENCOUNTER — Emergency Department
Admission: EM | Admit: 2019-05-12 | Discharge: 2019-05-12 | Disposition: A | Discharge status: Home / Self Care | Payer: Medicare Managed Care Other | Attending: Emergency Medicine | Admitting: Emergency Medicine

## 2019-05-12 DIAGNOSIS — W108XXA Fall (on) (from) other stairs and steps, initial encounter: Secondary | ICD-10-CM | POA: Insufficient documentation

## 2019-05-12 DIAGNOSIS — S43401A Unspecified sprain of right shoulder joint, initial encounter: Secondary | ICD-10-CM | POA: Insufficient documentation

## 2019-05-12 NOTE — Discharge Instructions (Signed)
Thank you!   Thank you for trusting [place] with your care today. I'm sorry you hurt your shoulder.  Use the sling as needed for comfort.  Ibuprofen (Advil) or naproxen (Aleve) as needed.  Gentle range of motion so you do not get a frozen shoulder. I hope you continue to get better. Please remember that an emergency visit is not a substitute for primary care, and we encourage you to see or get a primary care provider for your ongoing health. If you do not understand what to do in follow-up, please ask. If you have any new or unexplained symptoms, if you are worse, or if you are not getting better, call your provider. Feel free to come back to the emergency department at any time if you feel you need to be rechecked for any reason.   -- Cinda Quest. Verlon Carcione MD, Diplomate of the [employer] of Emergency Medicine

## 2019-05-12 NOTE — ED Notes (Signed)
MD at bedside. 
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
## 2019-05-12 NOTE — ED Provider Notes (Signed)
[facility] EMERGENCY DEPARTMENT  [place], [place]      Physician/Midlevel provider first contact with patient: 05/12/19 1930         FINAL DIAGNOSIS:   1. Sprain of right shoulder, unspecified shoulder sprain type, initial encounter           ASSESSMENT/DIFFERENTIAL DIAGNOSIS:  [name] is a 34 y.o. female with a right shoulder sprain.  She will need close follow-up as she may need further imaging including MRI to rule out ligamentous injury.    The differential diagnoses that were considered in the evaluation of this patient include, but were not limited to, fracture, dislocation, sprain, strain, contusion, rotator cuff tear, among others.    ED COURSE & MEDICAL DECISION MAKING:    The patient made stable throughout.  She was placed in a sling.  She will need close follow-up as she may have sustained a rotator cuff injury.    Patient's presentation indicates there does not appear to be a need for further inpatient workup. No further testing indicated acutely. Prior visits/ charts: Reviewed. Nurses' notes reviewed. Pertinent lab and radiology data was reviewed by me and discussed with patient and family, if present. I had a detailed discussion about the diagnosis, workup and treatment plan, and we are in agreement.  Comfortable with the suggested management and follow-up. We discussed signs and symptoms of concerns that would prompt immediate reassessment.         Visit Vitals  BP 93/65   Pulse 93   Temp 98.7 F (37.1 C) (Tympanic)   Resp 16   Ht 1.651 m   Wt 58.1 kg   LMP 04/25/2019   SpO2 98%   BMI 21.30 kg/m      RISK ASSESSMENT: The patient's records were checked through the [employer] (and other states where applicable and available), used in accordance with official notices posted in the Emergency Department and waiting room in accordance with law. This demonstrates a high number of prescriptions, providers and/or pharmacies. This is monitored to aid  in safety assessment for misuse, overuse, doctor-shopping, and/or diversion. The use of narcotic medication should be limited to only the amount absolutely necessary to treat the patient and only in situations where narcotics are clearly indicated. This is a patient safety issue. Specifically 42 controlled substances prescriptions from 11 providers utilizing 3 pharmacies.    RISK ASSESSMENT: The patient's records were checked through the Emergency Department Information Exchange in accordance with federal law. This demonstrates a frequency of ED visits at various hospitals, concerning chief complaints, number of prescriptions, providers and/or pharmacies that surpass a certain threshold. This is to assist in providing safe, rational care for persons who have triggered the EDIE through the system algorithm. This is a patient safety issue. Specifically, 13 visits to the emergency department at 5 institution(s) in the past year, this being the third this month.        MEDICATIONS ORDERED THIS VISIT:  ED Medication Orders (From admission, onward)    None          PROCEDURES:   Procedures      CONSULTATIONS:   None    DISPOSITION:   Discharge home    CONDITION AT DISPOSITION:    Satisfactory. Stable.  Improved.    PRESCRIPTIONS:     Discharge Medication List as of 05/12/2019  8:19 PM               HISTORIAN:  History obtained from the  patient.    CHIEF COMPLAINT:     Chief Complaint   Patient presents with    Shoulder Pain   . "  I fell and hurt my shoulder" according to patient    HISTORY OF PRESENT ILLNESS:    [name] is a very nice 34 y.o. female who complains of sharp pain in the right shoulder with movement, dull at rest after she fell carrying laundry down the steps yesterday.  She also states she fell, landing on her elbow yesterday when she was getting out of a van.  No premonitory symptoms causing the falls.  She has some intermittent numbness and tingling down in her finger.  She cannot abduct  the right arm more than a few degrees because of pain.  She has some mild pain at the elbow but does not feel needs x-rays.  She has had a history of shoulder problems and was told she had a rotator cuff tear but she declined surgery as she was too young at the time.  Marland Kitchen  REVIEW OF SYSTEMS:    Review of Systems.    At least 6 systems reviewed and negative, unless otherwise specified above.    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   Past Surgical History:   Procedure Laterality Date    APPENDECTOMY      CHOLECYSTECTOMY      Double Barrel Enterostomy  2013    for SBO    Enterostomy reversal  2014    double barrel small bowel ostomies reanastomosed    ILEOSTOMY  2011    for SBO after colectomy    ILEOSTOMY CLOSURE  2012    multiple times    LAPAROTOMY, COLECTOMY, TOTAL  2011    only a rectum remains    OVARIAN CYST SURGERY  2011-2016    several    SALPINGECTOMY, OPEN Right     2011   .     IMMUNIZATIONS:     Immunization History   Administered Date(s) Administered    INFLUENZA QUAD (FLULAVAL ONLY) 6 MOS & GREATER PRESERVED 11/24/2015    Influenza Vacc QUAD Recombinant PF 19yrs & up 11/15/2018    Influenza quadrivalent (IM) 6 months & up PRESERVED 11/24/2015    Influenza quadrivalent (IM) PF 3 Yrs & greater 11/16/2016       FAMILY HISTORY:     Family History   Problem Relation Age of Onset    Hypertension Mother     Diabetes Mother     Leukemia Father     Liver disease Maternal Grandfather     Hyperlipidemia Paternal Grandfather        HOME  MEDICATIONS:  Home Medications     Med List Status: Complete Set By: Junious Silk, RN at 05/12/2019  7:40 PM                albuterol sulfate HFA (  PROVENTIL) 108 (90 Base) MCG/ACT inhaler     Inhale 2 puffs into the lungs every 4 (four) hours as needed for Wheezing or Shortness of Breath     buprenorphine-naloxone (SUBOXONE) 8-2 MG Film     Place 1 tablet under the tongue 2 (two) times daily     clonazePAM (KlonoPIN) 0.5 MG tablet     Take 0.5 mg by mouth 3 (three) times daily as needed for Anxiety        diphenoxylate-atropine (LOMOTIL) 2.5-0.025 MG per tablet     Take 1 tablet by mouth 4 (four) times daily as needed for Diarrhea     docusate sodium (COLACE) 100 MG capsule     Take 1 capsule (100 mg total) by mouth 2 (two) times daily as needed for Constipation     fluticasone-salmeterol (ADVAIR HFA) 230-21 MCG/ACT inhaler     Inhale 2 puffs into the lungs 2 (two) times daily     naloxone (NARCAN) 4 MG/0.1ML nasal spray     1 spray intranasally. If pt does not respond or relapses into respiratory depression call 911. Give additional doses every 2-3 min.     ondansetron (Zofran ODT) 4 MG disintegrating tablet     Take 1 tablet (4 mg total) by mouth every 8 (eight) hours as needed for Nausea     pregabalin (LYRICA) 150 MG capsule     Take 150 mg by mouth 3 (three) times daily        propranolol (INDERAL) 20 MG tablet     Take 20 mg by mouth 3 (three) times daily as needed        TiZANidine (ZANAFLEX) 4 MG capsule     Take 8 mg by mouth 3 (three) times daily as needed     topiramate (TOPAMAX) 25 MG tablet     Take 25 mg by mouth every 12 (twelve) hours        traZODone (DESYREL) 300 MG tablet     Take 300 mg by mouth nightly     venlafaxine (EFFEXOR-XR) 150 MG 24 hr capsule     Take 150 mg by mouth daily     venlafaxine (EFFEXOR-XR) 75 MG 24 hr capsule     Take 75 mg by mouth daily           ALLERGY:     Allergies   Allergen Reactions    Fentanyl Anaphylaxis    Vancomycin Other (See Comments) and Edema     throat  swelling and turns red     Reglan [Metoclopramide] Hives    Rocephin [Ceftriaxone] Hives    Tramadol Itching and Nausea And Vomiting    Walnuts [Tree Nuts] Other (See Comments)     diverticulitis        SOCIAL HISTORY:      Social History     Tobacco Use    Smoking status: Current Every Day Smoker     Packs/day: 1.00     Types: Cigarettes    Smokeless tobacco: Never Used   Substance Use Topics    Alcohol use: Never    Drug use: Not Currently     Comment: opiates -- last use 2013       NOTE: If the PMH or PSH or social history sections read "Not on file" or are left blank it reflects that the histories were verbally investigated but the patient denies any past medical or surgical history or denies tobacco, alcohol or recreational drug use except as noted.  PHYSICAL EXAMINATION:  VITAL SIGNS:   Vitals:    05/12/19 1933   BP: 93/65   Pulse: 93   Resp: 16   Temp: 98.7 F (37.1 C)   SpO2: 98%     Weight:   Weight Monitoring 05/03/2019 05/03/2019 05/12/2019   Height 165.1 cm 165.1 cm 165.1 cm   Height Method Stated Stated Stated   Weight 58.877 kg 58.06 kg 58.06 kg   Weight Method Actual Stated Stated   BMI (calculated) 21.6 kg/m2 21.3 kg/m2 21.3 kg/m2       Reviewed by the Emergency Physician  Pulse Ox Interpretation by Emergency Physician: normal.   CONSTITUTIONAL: Vital signs reviewed. Well-appearing. Patient appears uncomfortable. Hydration: normal. Not toxic appearing.  HEAD: Atraumatic. Normocephalic.  EYES: Eyes are normal to inspection. Discharge from eyes: none. Extraocular muscles: intact.  ENT: External ears normal. No discharge from ears.   NECK: Range of motion: normal. Trachea midline.   RESPIRATORY/CHEST: Respiratory distress: none. Accessory muscle use: none. Work of breathing: normal.  EXTREMITY [x] UPPER [] LOWER [x] RIGHT [] LEFT: Range of motion limited by pain. Tenderness: Diffuse. Edema: None. Ecchymosis: None. Deformity: none definite, but she does not allow much palpation. Crepitus: none.  Instability: none. Open wound: None. Inspection otherwise normal. No cyanosis. No clubbing.   NEUROLOGIC:  Awake. Alert. Strength: normal. Sensation: normal. Conversation: fluent. NIHSS=0  SKIN: Skin temperature: warm. Skin hydration: dry. Skin otherwise intact. No acute rash.  LYMPHATIC: No pathologic adenopathy. No lymphangitis.  PSYCHIATRIC: Affect: anxious. Insight: normal.  Physical Exam    DATA REVIEWED:   RADIOLOGY:    XR Shoulder Right 2+ Views    Result Date: 05/12/2019  IMPRESSION: Normal study. ReadingStation:[id number]       Reviewed and interpreted by the Emergency Physician     LABS:     Results     None           Reviewed and interpreted by the Emergency Physician       Vida Rigger, MD  05/12/19 2155

## 2019-05-19 ENCOUNTER — Ambulatory Visit (RURAL_HEALTH_CENTER): Payer: Medicare Managed Care Other | Admitting: Nurse Practitioner

## 2019-05-23 ENCOUNTER — Encounter (RURAL_HEALTH_CENTER): Payer: Self-pay | Admitting: Nurse Practitioner

## 2019-05-23 ENCOUNTER — Ambulatory Visit: Payer: Medicare Managed Care Other | Attending: Nurse Practitioner | Admitting: Nurse Practitioner

## 2019-05-23 DIAGNOSIS — R110 Nausea: Secondary | ICD-10-CM

## 2019-05-23 DIAGNOSIS — M7541 Impingement syndrome of right shoulder: Secondary | ICD-10-CM

## 2019-05-23 DIAGNOSIS — F419 Anxiety disorder, unspecified: Secondary | ICD-10-CM

## 2019-05-23 DIAGNOSIS — M797 Fibromyalgia: Secondary | ICD-10-CM

## 2019-05-23 MED ORDER — CARISOPRODOL 350 MG PO TABS
350.0000 mg | ORAL_TABLET | Freq: Two times a day (BID) | ORAL | 0 refills | Status: DC | PRN
Start: 2019-05-23 — End: 2019-05-27

## 2019-05-23 MED ORDER — ONDANSETRON 4 MG PO TBDP
4.00 mg | ORAL_TABLET | Freq: Three times a day (TID) | ORAL | 2 refills | Status: DC | PRN
Start: 2019-05-23 — End: 2019-07-07

## 2019-05-23 MED ORDER — CLONAZEPAM 0.5 MG PO TABS
0.5000 mg | ORAL_TABLET | Freq: Every day | ORAL | Status: DC | PRN
Start: 2019-05-23 — End: 2019-07-07

## 2019-05-23 MED ORDER — DICLOFENAC SODIUM 3 % EX GEL
1.00 | Freq: Four times a day (QID) | CUTANEOUS | 1 refills | Status: DC | PRN
Start: 2019-05-23 — End: 2019-10-02

## 2019-05-23 MED ORDER — PREGABALIN 150 MG PO CAPS
150.00 mg | ORAL_CAPSULE | Freq: Three times a day (TID) | ORAL | 0 refills | Status: DC
Start: 2019-05-23 — End: 2019-05-27

## 2019-05-23 NOTE — Progress Notes (Signed)
Patient Name: [name],[name]    Subjective   History of Present Illness:   [name] is a 34 y.o. female who presents with:  Chief Complaint   Patient presents with    Nausea    Fibromyalgia    Shoulder Pain     chronic right     This visit was conducted with the use of interactive audio / video telecommunication that permitted real time communication between [employer], and myself. She consented to participation and received services through videoconferencing while I was located at [facility].    Medication Refill:  [name] presents today for medication refills. She has a history of fibromyalgia and has been seeing pain management who was dosing her Lyrica 150 mg three times a day along with Zanaflex 8 mg three times a day as needed. She reports that she is no longer seeing pain management and has been seeing a specialist who is prescribing her Suboxone. When reviewing her medication list she was prescribed Tylenol #3 from her oral surgeon who states this was a one time dose and she would have to get additional medication from her PCP. Discussed she is taking Suboxone and this is not usually done together. She reports she talked to her Suboxone specialist and she reports they are okay with this as long as they have daily pill counts. She states that she would rather take Soma vs Zanaflex. Discussed that she would need to see our medication specialist to discuss polypharmacy and straighten out her medications but she would need to make an in person appointment. She agrees. She also requests a refill of her Zofran that she takes regularly due to diffuse dysfunction of smooth muscles of the gastrointestinal tract.  She also complains of a 60% rotator cuff tear and requests a refill of her Diclofenac gel.       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 Authorizing Provider   albuterol sulfate HFA (PROVENTIL) 108 (90 Base) MCG/ACT inhaler Inhale 2 puffs into the lungs every 4 (four) hours as needed for Wheezing or Shortness of Breath 12/13/18 12/13/19  Donavyn Fecher, Martie Lee T, NP   buprenorphine-naloxone (SUBOXONE) 8-2 MG Film Place 1 tablet under the tongue 2 (two) times daily    [provider]   clonazePAM (KlonoPIN) 0.5 MG tablet Take 0.5 mg by mouth 3 (three) times daily as needed for Anxiety       [provider]   diphenoxylate-atropine (LOMOTIL) 2.5-0.025 MG per tablet Take 1 tablet by mouth 4 (four) times daily as needed for Diarrhea  [provider]   docusate sodium (COLACE) 100 MG capsule Take 1 capsule (100 mg total) by mouth 2 (two) times daily as needed for Constipation 05/06/19   Theotis Burrow, NP   fluticasone-salmeterol (ADVAIR HFA) 230-21 MCG/ACT inhaler Inhale 2 puffs into the lungs 2 (two) times daily 12/13/18   Marja Kays T, NP   naloxone [facility]) 4 MG/0.1ML nasal spray 1 spray intranasally. If pt does not respond or relapses into respiratory depression call 911. Give additional doses every 2-3 min. 03/10/19   Arther Abbott, MD   ondansetron (Zofran ODT) 4 MG disintegrating tablet Take 1 tablet (4 mg total) by mouth every 8 (eight) hours as needed for Nausea 03/06/19   Marja Kays T, NP    pregabalin (LYRICA) 150 MG capsule Take 150 mg by mouth 3 (three) times daily       [provider]   propranolol (INDERAL) 20 MG tablet Take 20 mg by mouth 3 (three) times daily as needed    04/03/19   [provider]   TiZANidine (ZANAFLEX) 4 MG capsule Take 8 mg by mouth 3 (three) times daily as needed    [provider]   topiramate (TOPAMAX) 25 MG tablet Take 25 mg by mouth every 12 (twelve) hours       [provider]   traZODone (DESYREL) 300 MG tablet Take 300 mg by mouth nightly    [provider]   venlafaxine (EFFEXOR-XR) 150 MG 24 hr capsule Take 150 mg by mouth daily    [provider]   venlafaxine (EFFEXOR-XR) 75 MG 24 hr capsule Take 75 mg by mouth daily    [provider]        Review of Systems:   14 system review negative except as noted in the HPI.    Physical Exam:      No vitals due to telephonic communication.     General:  no acute distress. Pleasant and well groomed.  Neck: no JVD, no tracheal deviation  Lungs: Effort normal, no use accessory muscles.  Neuro:  alert and oriented.   Skin: no rashes or lesions noted  Psychiatric/Behavioral: appropriate affect, mood and behavior.       Assessment:     1. Fibromyalgia    2. Impingement syndrome of right shoulder    3. Nausea    4. Anxiety        Plan:   Patient Instructions   We make every attempt to respond to your calls and emails within 72 hours.  Please note that this is only during business hours.  If you have a question or a problem that needs attention sooner, and it is after hours please call the physician on-call at [phone], these services begin after 5PM on weekdays.  If you have an emergency, call 911.     We will get you scheduled with our pharmacist for polypharmacy and medication management. This is not a guarantee that you will be restarted or continued on your medication. We may need to send you to higher level of care (another pain management).           Requested Prescriptions     Signed Prescriptions Disp Refills    clonazePAM (KlonoPIN) 0.5 MG tablet       Sig: Take 1 tablet (0.5 mg total) by mouth daily as needed for Anxiety    ondansetron (Zofran ODT) 4 MG disintegrating tablet 30 tablet 2     Sig: Take  1 tablet (4 mg total) by mouth every 8 (eight) hours as needed for Nausea    pregabalin (LYRICA) 150 MG capsule 90 capsule 0     Sig: Take 1 capsule (150 mg total) by mouth 3 (three) times daily    carisoprodol (Soma) 350 MG tablet 30 tablet 0     Sig: Take 1 tablet (350 mg total) by mouth 2 (two) times daily as needed for Muscle spasms    diclofenac sodium 3 % Gel 100 g 1     Sig: Apply 1 application topically 4 (four) times daily as needed (shoulder pain)        Patient was counseled on possible medicine side effects which may include rash, swelling and/or stomach upset.  Patient was instructed to notify me or the ER if they experience problems.    No orders of the defined types were placed in this encounter.       Before leaving the office, the patient was informed of all ordered diagnostic tests and consults.     If you have not heard back from [place] about test results in 14 days, please call the office at [phone].    Signed by: Marja Kays, FNP-C    Supervising Doctor: Rogelio Seen, MD    The patient's electronic medical record was reviewed, any changes in the past medical history, past surgical history, medications, diagnostic tests were noted, and the record was updated accordingly.     Discussed option available for my chart which provides electronic access to diagnostic results.      By signing my name below, I, Elesa Massed, attest that this documentation has been prepared under the direction and in the presence of Selim Durden, FNP-C.  Electronically Signed: Elesa Massed, Scribe.     I, Marja Kays, FNP-C, personally performed the services described in this documentation. All medical record entries made by the scribe were  at my direction and in my presence. I have reviewed the chart and plan instructions and agree that the record reflects my personal performance and is accurate and complete.

## 2019-05-23 NOTE — Patient Instructions (Addendum)
We make every attempt to respond to your calls and emails within 72 hours.  Please note that this is only during business hours.  If you have a question or a problem that needs attention sooner, and it is after hours please call the physician on-call at [phone], these services begin after 5PM on weekdays.  If you have an emergency, call 911.     We will get you scheduled with our pharmacist for polypharmacy and medication management. This is not a guarantee that you will be restarted or continued on your medication. We may need to send you to higher level of care (another pain management).

## 2019-05-24 ENCOUNTER — Encounter: Payer: Self-pay | Admitting: Critical Care Medicine

## 2019-05-24 ENCOUNTER — Inpatient Hospital Stay
Admission: EM | Admit: 2019-05-24 | Discharge: 2019-05-27 | DRG: 917 | Disposition: A | Discharge status: Home / Self Care | Payer: Medicare Managed Care Other | Source: Other Acute Inpatient Hospital | Attending: Internal Medicine | Admitting: Internal Medicine

## 2019-05-24 ENCOUNTER — Emergency Department
Admission: EM | Admit: 2019-05-24 | Discharge: 2019-05-24 | Disposition: A | Discharge status: Other Acute Inpatient Hospital | Payer: Medicare Managed Care Other | Attending: Emergency Medicine | Admitting: Emergency Medicine

## 2019-05-24 DIAGNOSIS — B9620 Unspecified Escherichia coli [E. coli] as the cause of diseases classified elsewhere: Secondary | ICD-10-CM | POA: Diagnosis present

## 2019-05-24 DIAGNOSIS — B182 Chronic viral hepatitis C: Secondary | ICD-10-CM | POA: Diagnosis present

## 2019-05-24 DIAGNOSIS — T50901A Poisoning by unspecified drugs, medicaments and biological substances, accidental (unintentional), initial encounter: Secondary | ICD-10-CM | POA: Diagnosis present

## 2019-05-24 DIAGNOSIS — Z9049 Acquired absence of other specified parts of digestive tract: Secondary | ICD-10-CM

## 2019-05-24 DIAGNOSIS — N390 Urinary tract infection, site not specified: Secondary | ICD-10-CM | POA: Diagnosis present

## 2019-05-24 DIAGNOSIS — M75100 Unspecified rotator cuff tear or rupture of unspecified shoulder, not specified as traumatic: Secondary | ICD-10-CM | POA: Diagnosis present

## 2019-05-24 DIAGNOSIS — Z8619 Personal history of other infectious and parasitic diseases: Secondary | ICD-10-CM

## 2019-05-24 DIAGNOSIS — R5383 Other fatigue: Secondary | ICD-10-CM | POA: Diagnosis present

## 2019-05-24 DIAGNOSIS — T428X1A Poisoning by antiparkinsonism drugs and other central muscle-tone depressants, accidental (unintentional), initial encounter: Secondary | ICD-10-CM | POA: Diagnosis present

## 2019-05-24 DIAGNOSIS — F411 Generalized anxiety disorder: Secondary | ICD-10-CM | POA: Diagnosis present

## 2019-05-24 DIAGNOSIS — J449 Chronic obstructive pulmonary disease, unspecified: Secondary | ICD-10-CM | POA: Diagnosis present

## 2019-05-24 DIAGNOSIS — G8929 Other chronic pain: Secondary | ICD-10-CM | POA: Diagnosis present

## 2019-05-24 DIAGNOSIS — F17210 Nicotine dependence, cigarettes, uncomplicated: Secondary | ICD-10-CM | POA: Diagnosis present

## 2019-05-24 DIAGNOSIS — Z885 Allergy status to narcotic agent status: Secondary | ICD-10-CM

## 2019-05-24 DIAGNOSIS — T424X1A Poisoning by benzodiazepines, accidental (unintentional), initial encounter: Secondary | ICD-10-CM | POA: Diagnosis present

## 2019-05-24 DIAGNOSIS — Z881 Allergy status to other antibiotic agents status: Secondary | ICD-10-CM

## 2019-05-24 DIAGNOSIS — T426X1A Poisoning by other antiepileptic and sedative-hypnotic drugs, accidental (unintentional), initial encounter: Principal | ICD-10-CM | POA: Diagnosis present

## 2019-05-24 DIAGNOSIS — Z79899 Other long term (current) drug therapy: Secondary | ICD-10-CM

## 2019-05-24 DIAGNOSIS — K5090 Crohn's disease, unspecified, without complications: Secondary | ICD-10-CM | POA: Diagnosis present

## 2019-05-24 DIAGNOSIS — Z91018 Allergy to other foods: Secondary | ICD-10-CM

## 2019-05-24 DIAGNOSIS — Z62810 Personal history of physical and sexual abuse in childhood: Secondary | ICD-10-CM | POA: Diagnosis present

## 2019-05-24 DIAGNOSIS — Z915 Personal history of self-harm: Secondary | ICD-10-CM

## 2019-05-24 DIAGNOSIS — Z806 Family history of leukemia: Secondary | ICD-10-CM

## 2019-05-24 DIAGNOSIS — F319 Bipolar disorder, unspecified: Secondary | ICD-10-CM | POA: Diagnosis present

## 2019-05-24 DIAGNOSIS — K219 Gastro-esophageal reflux disease without esophagitis: Secondary | ICD-10-CM | POA: Diagnosis present

## 2019-05-24 DIAGNOSIS — M797 Fibromyalgia: Secondary | ICD-10-CM | POA: Diagnosis present

## 2019-05-24 DIAGNOSIS — T50904A Poisoning by unspecified drugs, medicaments and biological substances, undetermined, initial encounter: Secondary | ICD-10-CM | POA: Insufficient documentation

## 2019-05-24 DIAGNOSIS — Z833 Family history of diabetes mellitus: Secondary | ICD-10-CM

## 2019-05-24 DIAGNOSIS — R109 Unspecified abdominal pain: Secondary | ICD-10-CM | POA: Diagnosis present

## 2019-05-24 DIAGNOSIS — R000 Tachycardia, unspecified: Secondary | ICD-10-CM | POA: Diagnosis present

## 2019-05-24 DIAGNOSIS — Z8249 Family history of ischemic heart disease and other diseases of the circulatory system: Secondary | ICD-10-CM

## 2019-05-24 DIAGNOSIS — Z83438 Family history of other disorder of lipoprotein metabolism and other lipidemia: Secondary | ICD-10-CM

## 2019-05-24 DIAGNOSIS — F4310 Post-traumatic stress disorder, unspecified: Secondary | ICD-10-CM | POA: Diagnosis present

## 2019-05-24 DIAGNOSIS — G4700 Insomnia, unspecified: Secondary | ICD-10-CM | POA: Diagnosis present

## 2019-05-24 DIAGNOSIS — G9341 Metabolic encephalopathy: Secondary | ICD-10-CM | POA: Diagnosis present

## 2019-05-24 LAB — VH URINE DRUG SCREEN
Cannabinoids: NEGATIVE
Cannabinoids: NEGATIVE
Cocaine: NEGATIVE
Cocaine: NEGATIVE
Methamphetamine: NEGATIVE
Phencyclidine: NEGATIVE
Phencyclidine: NEGATIVE
Propoxyphene: NEGATIVE
Urine Amphetamine: NEGATIVE
Urine Amphetamine: NEGATIVE
Urine Barbiturates: NEGATIVE
Urine Barbiturates: NEGATIVE
Urine Benzodiazepines: POSITIVE — AB
Urine Benzodiazepines: POSITIVE — AB
Urine Buprenorphine: POSITIVE — AB
Urine Buprenorphine: POSITIVE — AB
Urine Creatinine Random: 68 mg/dL
Urine Ecstasy Screen: NEGATIVE
Urine Fentanyl Screen: NEGATIVE
Urine Methadone Screen: NEGATIVE
Urine Methadone Screen: NEGATIVE
Urine Opiates: POSITIVE — AB
Urine Opiates: POSITIVE — AB
Urine Oxycodone: NEGATIVE
Urine Oxycodone: NEGATIVE
Urine Specific Gravity: 1.018 (ref 1.001–1.040)
Urine Tricyclics: NEGATIVE
pH, Urine: 6.1 pH (ref 5.0–8.0)

## 2019-05-24 LAB — COMPREHENSIVE METABOLIC PANEL
ALT: 75 U/L — ABNORMAL HIGH (ref 0–55)
ALT: 62 U/L — ABNORMAL HIGH (ref 0–55)
AST (SGOT): 21 U/L (ref 10–42)
AST (SGOT): 23 U/L (ref 10–42)
Albumin/Globulin Ratio: 1.21 Ratio (ref 0.80–2.00)
Albumin/Globulin Ratio: 1.21 Ratio (ref 0.80–2.00)
Albumin: 4 gm/dL (ref 3.5–5.0)
Albumin: 3.4 gm/dL — ABNORMAL LOW (ref 3.5–5.0)
Alkaline Phosphatase: 102 U/L (ref 40–145)
Alkaline Phosphatase: 91 U/L (ref 40–145)
Anion Gap: 14.4 mMol/L (ref 7.0–18.0)
Anion Gap: 12.1 mMol/L (ref 7.0–18.0)
BUN / Creatinine Ratio: 16.3 Ratio (ref 10.0–30.0)
BUN / Creatinine Ratio: 14.5 Ratio (ref 10.0–30.0)
BUN: 15 mg/dL (ref 7–22)
BUN: 10 mg/dL (ref 7–22)
Bilirubin, Total: 0.3 mg/dL (ref 0.1–1.2)
Bilirubin, Total: 0.3 mg/dL (ref 0.1–1.2)
CO2: 24 mMol/L (ref 20.0–30.0)
CO2: 20 mMol/L (ref 20–30)
Calcium: 9 mg/dL (ref 8.5–10.5)
Calcium: 7.9 mg/dL — ABNORMAL LOW (ref 8.5–10.5)
Chloride: 112 mMol/L — ABNORMAL HIGH (ref 98–110)
Chloride: 117 mMol/L — ABNORMAL HIGH (ref 98–110)
Creatinine: 0.92 mg/dL (ref 0.60–1.20)
Creatinine: 0.69 mg/dL (ref 0.60–1.20)
EGFR: 83 mL/min/{1.73_m2} (ref 60–150)
EGFR: 116 mL/min/{1.73_m2} (ref 60–150)
Globulin: 3.3 gm/dL (ref 2.0–4.0)
Globulin: 2.8 gm/dL (ref 2.0–4.0)
Glucose: 102 mg/dL — ABNORMAL HIGH (ref 71–99)
Glucose: 123 mg/dL — ABNORMAL HIGH (ref 71–99)
Osmolality Calculated: 292 mOsm/kg (ref 275–300)
Osmolality Calculated: 289 mOsm/kg (ref 275–300)
Potassium: 4.4 mMol/L (ref 3.5–5.3)
Potassium: 4.1 mMol/L (ref 3.5–5.3)
Protein, Total: 7.3 gm/dL (ref 6.0–8.3)
Protein, Total: 6.2 gm/dL (ref 6.0–8.3)
Sodium: 146 mMol/L (ref 136–147)
Sodium: 145 mMol/L (ref 136–147)

## 2019-05-24 LAB — VH URINALYSIS WITH MICROSCOPIC AND CULTURE IF INDICATED
Bilirubin, UA: NEGATIVE mg/dL
Blood, UA: NEGATIVE mg/dL
Glucose, UA: NEGATIVE mg/dL
Leukocyte Esterase, UA: NEGATIVE Leu/uL
Nitrite, UA: POSITIVE — AB
Protein, UR: NEGATIVE mg/dL
Urine Specific Gravity: 1.025 (ref 1.001–1.040)
Urobilinogen, UA: 0.2 mg/dL
pH, Urine: 6.5 pH (ref 5.0–8.0)

## 2019-05-24 LAB — CBC AND DIFFERENTIAL
Basophils %: 1.3 % (ref 0.0–3.0)
Basophils Absolute: 0.1 10*3/uL (ref 0.0–0.3)
Basophils Absolute: 0 10*3/uL (ref 0.0–0.3)
Eosinophils %: 3.7 % (ref 0.0–7.0)
Eosinophils Absolute: 0.3 10*3/uL (ref 0.0–0.8)
Eosinophils Absolute: 0 10*3/uL (ref 0.0–0.8)
Hematocrit: 47.5 % (ref 36.0–48.0)
Hematocrit: 40.5 % (ref 36.0–48.0)
Hemoglobin: 15.4 gm/dL (ref 12.0–16.0)
Hemoglobin: 13.6 gm/dL (ref 12.0–16.0)
Lymphocytes Absolute: 2.9 10*3/uL (ref 0.6–5.1)
Lymphocytes Absolute: 1.4 10*3/uL (ref 0.6–5.1)
Lymphocytes: 32.2 % (ref 15.0–46.0)
Lymphocytes: 13 % — ABNORMAL LOW (ref 15.0–46.0)
MCH: 33 pg (ref 28–35)
MCH: 34 pg (ref 28–35)
MCHC: 32 gm/dL (ref 31–36)
MCHC: 34 gm/dL (ref 32–36)
MCV: 102 fL — ABNORMAL HIGH (ref 80–100)
MCV: 102 fL — ABNORMAL HIGH (ref 80–100)
MPV: 8.2 fL (ref 6.0–10.0)
MPV: 8.7 fL (ref 6.0–10.0)
Monocytes Absolute: 0.9 10*3/uL (ref 0.1–1.7)
Monocytes Absolute: 1.1 10*3/uL (ref 0.1–1.7)
Monocytes: 9.8 % (ref 3.0–15.0)
Monocytes: 10 % (ref 3.0–15.0)
Neutrophils %: 53 % (ref 42.0–78.0)
Neutrophils %: 77 % (ref 42.0–78.0)
Neutrophils Absolute: 4.8 10*3/uL (ref 1.7–8.6)
Neutrophils Absolute: 8.2 10*3/uL (ref 1.7–8.6)
PLT CT: 294 10*3/uL (ref 130–440)
PLT CT: 246 10*3/uL (ref 130–440)
RBC: 4.67 10*6/uL (ref 3.80–5.00)
RBC: 3.97 10*6/uL (ref 3.80–5.00)
RDW: 11.9 % (ref 10.5–14.5)
RDW: 11.6 % (ref 11.0–14.0)
WBC: 9 10*3/uL (ref 4.0–11.0)
WBC: 10.7 10*3/uL (ref 4.0–11.0)

## 2019-05-24 LAB — MAGNESIUM: Magnesium: 1.8 mg/dL (ref 1.6–2.6)

## 2019-05-24 LAB — CALCIUM, IONIZED: Calcium, Ionized: 4.32 mg/dL — ABNORMAL LOW (ref 4.35–5.10)

## 2019-05-24 LAB — TSH: TSH: 1.45 u[IU]/mL (ref 0.40–4.20)

## 2019-05-24 LAB — ACETAMINOPHEN LEVEL: Acetaminophen Level: <0.6 ug/mL (ref 0.0–30.0)

## 2019-05-24 LAB — PHOSPHORUS: Phosphorus: 2.3 mg/dL (ref 2.3–4.7)

## 2019-05-24 LAB — SALICYLATE LEVEL: Salicylate Level: <5 mg/dL (ref 0.0–30.0)

## 2019-05-24 LAB — ETHANOL: Alcohol: <10 mg/dL (ref 0–9)

## 2019-05-24 LAB — URINE HCG QUALITATIVE: Urine HCG Qualitative: NEGATIVE

## 2019-05-24 MED ORDER — VH NURSING CARE MEDICATION PROTOCOL PLACEHOLDER
1.00 | Status: DC
Start: 2019-05-24 — End: 2019-05-26

## 2019-05-24 MED ORDER — NICOTINE 21 MG/24HR TD PT24
1.00 | MEDICATED_PATCH | Freq: Every day | TRANSDERMAL | Status: DC
Start: 2019-05-24 — End: 2019-05-27
  Administered 2019-05-26 – 2019-05-27 (×2): 1 via TRANSDERMAL
  Filled 2019-05-24 (×4): qty 1

## 2019-05-24 MED ORDER — MAGNESIUM OXIDE 400 MG TABS (WRAP)
400.00 mg | ORAL_TABLET | Freq: Four times a day (QID) | ORAL | Status: AC
Start: 2019-05-24 — End: 2019-05-25
  Administered 2019-05-25: 02:00:00 400 mg via ORAL
  Filled 2019-05-24 (×2): qty 1

## 2019-05-24 MED ORDER — LACTATED RINGERS IV SOLN
INTRAVENOUS | Status: DC
Start: 2019-05-24 — End: 2019-05-27

## 2019-05-24 MED ORDER — SODIUM CHLORIDE (PF) 0.9 % IJ SOLN
3.00 mL | Freq: Three times a day (TID) | INTRAMUSCULAR | Status: DC
Start: 2019-05-24 — End: 2019-05-27
  Administered 2019-05-24 – 2019-05-26 (×7): 3 mL via INTRAVENOUS

## 2019-05-24 MED ORDER — SODIUM CHLORIDE 0.9 % IV BOLUS
1000.00 mL | Freq: Once | INTRAVENOUS | Status: AC
Start: 2019-05-24 — End: 2019-05-24
  Administered 2019-05-24: 10:00:00 1000 mL via INTRAVENOUS

## 2019-05-24 MED ORDER — ENOXAPARIN SODIUM 40 MG/0.4ML SC SOLN
40.00 mg | SUBCUTANEOUS | Status: DC
Start: 2019-05-24 — End: 2019-05-27
  Filled 2019-05-24 (×4): qty 0.4

## 2019-05-24 MED ORDER — VH ELECTROLYTE REPLACEMENT PROTOCOL PLACEHOLDER
Status: DC | PRN
Start: 2019-05-24 — End: 2019-05-24
  Filled 2019-05-24: qty 1

## 2019-05-24 NOTE — Assessment & Plan Note (Signed)
A/P: multiple admissions in the past several months for complaints of abdominal pain, surgical history dating back to 2011 in [place]. Apparently due to followup with [facility] per notes in most recent admission.

## 2019-05-24 NOTE — H&P (Signed)
[facility]  Admission Note    Patient Name: [name],[name]    Attending Physician: Leory Plowman, MD                                             LOS:  0 DAYS   Primary Care Physician: Kathi Simpers, NP            ROOM#: 3520/3520-A                                  Assessment    34 year old arrives in transfer from [facility] after presenting there with a polypharmacy overdose.     Interval Events / ICU Course    5/1: admitted to [facility]    Assessment and Plan:                                                             Digestive  Crohn's disease  Assessment & Plan  A/P: multiple admissions in the past several months for complaints of abdominal pain, surgical history dating back to 2011 in [place]. Apparently due to followup with [facility] per notes in most recent admission.    Chronic hepatitis C  Assessment & Plan  A/P: by history, unclear if treated    Other  Overdose  Assessment & Plan  A/P: patient presented to OSH due to lethargy and concern for overdose, concern to have ingested uncertain quantities of soma, lyrica and xanax, significant other called EMS this am. Per review of medical record from patient's significant other, " in [place] last night partying, pt actually sold 4 Soma at that time, so probably only took 22 pills. Unknown what happened to the Lyrica as they cannot find the bottle that was just filled, [name] does tell Dr Freida Busman that pt abuses Lyrica and she has to keep hers locked away from pt"  Noted to be hemodynamically stable with a mild tachycardia, oxygenating well, responding to voice and painful stimuli,  protecting airway, more alert prior to transfer. Recommendations by poison control per MEDICAL RECORD NUMBERAdvised to protect airway/monitor respiratory status, give IV fluids, draw labwork for tox screen, perform EKG.  Patient arrived to [facility] tearful, no recollection of night's events nor morning's events, ie arriving to [facility], she does however remember going to [place]. She  states she did not refill lyrica prescription yesterday, does not recall taking medications such as soma yesterday nor last night, she has no explanation as to why there are so few soma tablets left.  Patient is currently refusing blood draws, removing medical equipment, minimally participatory with interview and assessment.   Will need psych eval, assessment for intent, polypharmacy        Chronic abdominal pain  Assessment & Plan  A/P: as above    Fatigue  Assessment & Plan  A/P: as above    GAD (generalized anxiety disorder)  Assessment & Plan  A/P: as above    Insomnia  Assessment & Plan  A/P: as above    Fibromyalgia  Assessment & Plan  A/P: as above under bipolar, per [facility] health  notes, patient recently on suboxone   Telemedicine visit yesterday, Lyrica 150 mg three times a day along with Zanaflex 8 mg three times a day as needed, stated during that visit "she would rather take Soma vs Zanaflex".   Medications prescribed at yesterdays visit:  clonazePAM (KlonoPIN) 0.5 MG tablet        Sig: Take 1 tablet (0.5 mg total) by mouth daily as needed for Anxiety    ondansetron (Zofran ODT) 4 MG disintegrating tablet 30 tablet 2     Sig: Take 1 tablet (4 mg total) by mouth every 8 (eight) hours as needed for Nausea    pregabalin (LYRICA) 150 MG capsule 90 capsule 0     Sig: Take 1 capsule (150 mg total) by mouth 3 (three) times daily    carisoprodol (Soma) 350 MG tablet 30 tablet 0     Hold above medications due to unknown medications ingested at time of overdose      Bipolar disorder  Assessment & Plan  A/P: followed as an outpatient, most recently documented regimen [facility]: 04/18/19  EffexorXR 225mg  daily   Trazodone 300 mg daily at bedtime   Klonopin 0.5 mg up to1times daily as needed for acute anxiety   Topamax 25 mg BID  Documented that visit as well:  Previous Psychopharmacological Trials   Prozac-ineffective   Celexa-ineffective   Lexapro-ineffective    Wellbutrin-ineffective   Hydroxyzine-ineffective   BuSpar-ineffective   Xanax-ineffective   Klonopin-effective   Ativan-ineffective   Lithium-angry   Latuda-effective but had GI upset   Lamictal-mostly ineffective   Cymbalta GI upset   Depakote-ineffective   Risperdal/felt like she was out of reality   Lithium-effective   Seroquel-anxiety   Abilify-nightmares    Hold home medications as unclear as to what patient ingested             General ICU Assessment / Plans - if applicable    Vascular access: Peripherals.      GI Prophylaxis: None needed.  Nutrition: NPO.              Sedation: None needed.  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     No mechanical SBO - atonic bowel with 6 hour SB transit time; No COLON present    Fibromyalgia     Gastroesophageal reflux disease     Insomnia     Seasonal allergic rhinitis        Past Surgical History:    Past Surgical History:   Procedure Laterality Date    APPENDECTOMY      CHOLECYSTECTOMY      Double Barrel Enterostomy  2013    for SBO    Enterostomy reversal  2014    double barrel small bowel ostomies reanastomosed    ILEOSTOMY  2011    for SBO after colectomy    ILEOSTOMY CLOSURE  2012    multiple times    LAPAROTOMY, COLECTOMY, TOTAL  2011    only a rectum remains    OVARIAN CYST SURGERY  2011-2016    several    SALPINGECTOMY, OPEN Right     2011       Family History:    Family History   Problem Relation Age of Onset    Hypertension Mother     Diabetes Mother     Leukemia  Father     Liver disease Maternal Grandfather     Hyperlipidemia Paternal Grandfather        Social History:     Social History     Socioeconomic History    Marital status: Single     Spouse name: Not on file    Number of children: 0    Years of education: 9    Highest education level: Not on file   Occupational History     Comment: disability -2008,  "intestianal problems"   Tobacco Use    Smoking status: Current Every Day Smoker     Packs/day: 1.00     Types: Cigarettes    Smokeless tobacco: Never Used   Substance and Sexual Activity    Alcohol use: Never    Drug use: Not Currently     Comment: opiates -- last use 2013    Sexual activity: Yes     Partners: Female   Other Topics Concern    Not on file   Social History Narrative    Not on file     Social Determinants of Health     Financial Resource Strain:     Difficulty of Paying Living Expenses:    Food Insecurity:     Worried About Programme researcher, broadcasting/film/video in the Last Year:     Barista in the Last Year:    Transportation Needs:     Freight forwarder (Medical):     Lack of Transportation (Non-Medical):    Physical Activity:     Days of Exercise per Week:     Minutes of Exercise per Session:    Stress:     Feeling of Stress :    Social Connections:     Frequency of Communication with Friends and Family:     Frequency of Social Gatherings with Friends and Family:     Attends Religious Services:     Active Member of Clubs or Organizations:     Attends Engineer, structural:     Marital Status:    Intimate Partner Violence:     Fear of Current or Ex-Partner:     Emotionally Abused:     Physically Abused:     Sexually Abused:         PHYSICAL EXAM   Vitals: BP 109/67    Pulse (!) 101    Temp 98.7 F (37.1 C) (Oral)    Resp 18    Ht 1.651 m (5\' 5" )    Wt 58.4 kg (128 lb 12 oz)    LMP 04/25/2019    SpO2 98%    BMI 21.42 kg/m    Admission Weight: Weight: 58.4 kg (128 lb 12 oz)  Last Weight:   Wt Readings from Last 1  Encounters:   05/24/19 58.4 kg (128 lb 12 oz)         Body mass index is 21.42 kg/m.  I/O:   No intake or output data in the 24 hours ending 05/24/19  1353  Vent settings:      Review of Systems   Reason unable to perform ROS: patient minimally paticipatory with interview.       Physical Exam  Vitals and nursing note reviewed.   Constitutional:       Comments: Tearful  Avoiding eye contact   "why am I here"   HENT:      Head: Normocephalic.      Mouth/Throat:      Mouth: Mucous membranes are moist.   Eyes:      Pupils: Pupils are equal, round, and reactive to light.   Cardiovascular:      Rate and Rhythm: Normal rate and regular rhythm.      Pulses: Normal pulses.      Heart sounds: Normal heart sounds.   Pulmonary:      Effort: Pulmonary effort is normal.      Breath sounds: Normal breath sounds.   Abdominal:      General: Abdomen is flat.      Palpations: Abdomen is soft.   Musculoskeletal:         General: No swelling.   Skin:     General: Skin is warm and dry.      Coloration: Skin is pale.   Neurological:      General: No focal deficit present.      Comments: Tearful   MAE  "why am I here"  "I dont know"            Tubes/Lines/Airway              Patient Lines/Drains/Airways Status    Active PICC Line / CVC Line / PIV Line / Drain / Airway / Intraosseous Line / Epidural Line / ART Line / Line / Wound / Pressure Ulcer / NG/OG Tube     Name:   Placement date:   Placement time:   Site:   Days:    Peripheral IV 05/24/19 24 G Left Forearm   05/24/19    0920    Forearm   less than 1    Urethral Catheter Straight-tip 16 Fr.   05/24/19    1040    Straight-tip   less than 1                       LABS:   Labs Reviewed:    CBC:   Recent Labs   Lab 05/24/19  0920   WBC 9.0   Hemoglobin 15.4   Hematocrit 47.5   PLT CT 294         Coags:         ABGs:  No results found for: ABGCOLLECTIO, ALLENSTEST, PHART, PCO2ART, PO2ART, HCO3ART, BEART, O2SATART    Lactic Acid I-Stat   Date Value Ref Range Status   03/11/2019 0.6 0.5 - 1.9  mMol/L Final     Comment:     The above 4 analytes were performed by [facility] [id number])  [address],[place] [address]      Chemistry:   Recent Labs     05/24/19  0920   Sodium 146   Potassium 4.4   Chloride 112*   CO2 24.0   BUN 15   Creatinine 0.92   EGFR 83   Glucose 102*   Calcium 9.0       LFTs:   Recent Labs   Lab 05/24/19  0920   Albumin 4.0   Protein, Total 7.3  Bilirubin, Total 0.3   Alkaline Phosphatase 102   ALT 75*   AST (SGOT) 21   Glucose 102*              Other:    RADIOLOGY / IMAGING:   Imaging personally reviewed by me, including: None     ATTESTATION & BILLING      Patient's condition and plan discussed with: patient, bedside nurse and Dr Reola Calkins    Billing:  Critical care time: 45 minutes    I managed/supervised life or organ supporting interventions that required frequent physician assessments. I devoted my full attention in the ICU to the direct care of this patient for this period of time while critically ill.    Any critical care time was performed today and is exclusive of teaching, billable procedures, and not overlapping with any other providers.    Signed by: Bobbe Medico, NP   ZO:[id number], Trenton Founds, NP

## 2019-05-24 NOTE — ED Notes (Signed)
Pt more alert at this time, attempting to pull out IV line. Second nurse in to reinforce IV, pt yelling out of room.

## 2019-05-24 NOTE — ED Notes (Signed)
Pt out of the ED and in the care of VMT.

## 2019-05-24 NOTE — Assessment & Plan Note (Signed)
A/P:   -as above
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
## 2019-05-24 NOTE — Plan of Care (Addendum)
NURSE NOTE SUMMARY  [facility] - CRITICAL CARE 2   Patient Name: [name],[name]   Attending Physician: Leory Plowman, MD   Today's date:   05/24/2019 LOS: 0 days   Shift Summary:                                                                   Provider Notifications:       1229: patient admitted and receiving from outlying facility, received updates from transport crew EMT/PARAMEDIC, medical team notified of arrival, see MAR/ORDERS provided         Rapid Response Notifications:  Mobility:       None       Weight tracking:  Family Dynamic:   No data found.          Last Bowel Movement   Last BM Date: 05/06/19

## 2019-05-24 NOTE — Assessment & Plan Note (Addendum)
A/P: patient presented to OSH due to lethargy and concern for overdose, concern to have ingested uncertain quantities of soma, lyrica and xanax, significant other called EMS this am. Per review of medical record from patient's significant other, " in [place] last night partying, pt actually sold 4 Soma at that time, so probably only took 22 pills. Unknown what happened to the Lyrica as they cannot find the bottle that was just filled, [name] does tell Dr Freida Busman that pt abuses Lyrica and she has to keep hers locked away from pt"  Noted to be hemodynamically stable with a mild tachycardia, oxygenating well, responding to voice and painful stimuli,  protecting airway, more alert prior to transfer. Recommendations by poison control per MEDICAL RECORD NUMBERAdvised to protect airway/monitor respiratory status, give IV fluids, draw labwork for tox screen, perform EKG.  Patient arrived to [facility] tearful, no recollection of night's events nor morning's events, ie arriving to [facility], she does however remember going to [place]. She states she did not refill lyrica prescription yesterday, does not recall taking medications such as soma yesterday nor last night, she has no explanation as to why there are so few soma tablets left.  Patient is currently refusing blood draws, removing medical equipment, minimally participatory with interview and assessment.   Will need psych eval, assessment for intent, polypharmacy

## 2019-05-24 NOTE — EDIE (Signed)
COLLECTIVE?NOTIFICATION?05/24/2019 09:09?CONI, HOMESLEY M?MRN: [id number]    [facility]'s patient encounter information:   RJJ:[id number]  Account [id number]  Billing Account [id number]      Criteria Met      High Utilization (6+ ED Visits/6 Mo.)    Security and Safety  No recent Security Events currently on file    ED Care Guidelines  There are currently no ED Care Guidelines for this patient. Please check your facility's medical records system.        Prescription Monitoring Program  Narx Score not available at this time.      E.D. Visit Count (12 mo.)  Facility Visits   [facility] 1   [facility] 5   [facility] 3   [facility] 2   [facility] 3   Total 14   Note: Visits indicate total known visits.     Recent Emergency Department Visit Summary  Showing 10 most recent visits out of 15 in the past 12 months  Date Facility [facility] Type Diagnoses or Chief Complaint   May 24, 2019 [name] [place] [facility]      OverDose      May 12, 2019 [name] [place] [facility]      SHOULDER INJURY      Shoulder Pain      Unspecified sprain of right shoulder joint, initial encounter      May 03, 2019 [facility] [place] [facility]      SBO      Abdominal Pain      Unspecified intestinal obstruction, unspecified as to partial versus complete obstruction      May 03, 2019 [name] [place] [facility]      vomiting, abdominal pain      Abdominal Pain      Emesis      Complete intestinal obstruction, unspecified as to cause      Vomiting of fecal matter      Mar 11, 2019 [facility] [place] [place] [facility]      abd pain      Unspecified intestinal obstruction, unspecified as to partial versus complete obstruction      Mar 11, 2019 [name] [place] [facility]      Abdominal Pain      Unspecified  intestinal obstruction, unspecified as to partial versus complete obstruction      Mar 08, 2019 [facility] [place] [place] [facility]      abd pain      Abdominal Pain      Unspecified intestinal obstruction, unspecified as to partial versus complete obstruction      Mar 07, 2019 [name] [place] [facility]      vomiting, diarrhea, stomach pain      Abdominal Pain      Unspecified intestinal obstruction, unspecified as to partial versus complete obstruction      Dec 05, 2018 [facility] [place] [place] [facility]      possible bowel obstruction      Abdominal Pain      Unspecified intestinal obstruction, unspecified as to partial versus complete obstruction      Dec 03, 2018 [facility] [place] [place] [facility]      Abd Pain,Diarhea,Nausea  Abdominal Pain      Nausea      Ileus, unspecified          Recent Inpatient Visit Summary  Date Facility [facility] Type Diagnoses or Chief Complaint   May 03, 2019 [facility] [place] [place] [facility]      Unspecified intestinal obstruction, unspecified as to partial versus complete obstruction      Other chronic pain      Unspecified abdominal pain      Other specified functional intestinal disorders      Generalized anxiety disorder      Fibromyalgia      Bipolar disorder, unspecified      Mar 11, 2019 [facility] [place] [facility]      Unspecified intestinal obstruction, unspecified as to partial versus complete obstruction      Mar 08, 2019 [facility] [place] [place] [facility]      Unspecified intestinal obstruction, unspecified as to partial versus complete obstruction      Dec 05, 2018 [facility] [place] [place] [facility]      Unspecified intestinal obstruction, unspecified as to partial versus complete obstruction          Care Team  Provider Specialty Phone Fax Service Dates   Adline Mango, MD Family Medicine [phone] Jan 24, 2019 - Current     Adline Mango Primary Care [phone]  Jan 24, 2019 - Current    Kathi Simpers Primary Care   Current      Collective Portal  This patient has registered at the [facility] Emergency Department   For more information visit: [email or web address]     PLEASE NOTE:     1.   Any care recommendations and other clinical information are provided as guidelines or for historical purposes only, and providers should exercise their own clinical judgment when providing care.    2.   You may only use this information for purposes of treatment, payment or health care operations activities, and subject to the limitations of applicable Collective Policies.    3.   You should consult directly with the organization that provided a care guideline or other clinical history with any questions about additional information or accuracy or completeness of information provided.    ? 2021 [place], [employer] - [email or web address]

## 2019-05-24 NOTE — ED Notes (Signed)
Spoke with Debby Bud at [facility], pts info provided. Advised to protect airway/monitor respiratory status, give IV fluids, draw labwork for tox screen, perform EKG.

## 2019-05-24 NOTE — Assessment & Plan Note (Addendum)
A/P: followed as an outpatient, most recently documented regimen [facility]: 04/18/19  EffexorXR 225mg  daily   Trazodone 300 mg daily at bedtime   Klonopin 0.5 mg up to1times daily as needed for acute anxiety   Topamax 25 mg BID  Documented that visit as well:  Previous Psychopharmacological Trials   Prozac-ineffective   Celexa-ineffective   Lexapro-ineffective   Wellbutrin-ineffective   Hydroxyzine-ineffective   BuSpar-ineffective   Xanax-ineffective   Klonopin-effective   Ativan-ineffective   Lithium-angry   Latuda-effective but had GI upset   Lamictal-mostly ineffective   Cymbalta GI upset   Depakote-ineffective   Risperdal/felt like she was out of reality   Lithium-effective   Seroquel-anxiety   Abilify-nightmares    Hold home medications as unclear as to what patient ingested

## 2019-05-24 NOTE — Plan of Care (Addendum)
NURSE NOTE SUMMARY  [facility] - CRITICAL CARE 2   Patient Name: [name],[name]   Attending Physician: Leory Plowman, MD   Today's date:   05/24/2019 LOS: 0 days   Shift Summary:                                                              1230:care assumed,  Pt's Tearful and very agitated,  Stating she don't remember what happen and why is she here. Currently refusing blood draw, removing medical equipment. Patient non compliant  1820: transfer to room 556, report given   Provider Notifications:        Rapid Response Notifications:  Mobility:      PMP Activity: Step 1 - Bedrest (05/24/2019  1:00 PM)     Weight tracking:  Family Dynamic:   Last 3 Weights for the past 72 hrs (Last 3 readings):   Weight   05/24/19 1300 58.4 kg (128 lb 12 oz)             Last Bowel Movement   Last BM Date: 05/06/19       Problem: Moderate/High Fall Risk Score >5  Goal: Patient will remain free of falls  Outcome: Progressing     Problem: Moderate/High Fall Risk Score >5  Goal: Patient will remain free of falls  05/24/2019 1515 by Adelene Amas, RN  Outcome: Progressing  Flowsheets (Taken 05/24/2019 1400)  VH High Risk (Greater than 13):   ALL REQUIRED LOW INTERVENTIONS   ALL REQUIRED MODERATE INTERVENTIONS   RED "HIGH FALL RISK" SIGNAGE   BED ALARM WILL BE ACTIVATED WHEN THE PATEINT IS IN BED WITH SIGNAGE "RESET BED ALARM"   A CHAIR PAD ALARM WILL BE USED WHEN PATIENT IS UP SITTING IN A CHAIR  05/24/2019 1512 by Adelene Amas, RN  Outcome: Progressing

## 2019-05-24 NOTE — ED Provider Notes (Signed)
Physician/Midlevel provider first contact with patient: 05/24/19 0908         History     Chief Complaint   Patient presents with    Drug Overdose     Patient arrives by EMS for probable drug overdose.  History is not able to be obtained from patient as she is not able to answer questions and verbally respond.  She is only responsive to painful stimuli and some verbal stimuli.  She is not following commands.  Therefore history is obtained from EMS report and also called the patient's girlfriend at patient's living area.  The obtainable history at this point is that patient along with the girlfriend went in to [place] last night.  The patient was complained of right shoulder pain.  It is noted that patient has a right rotator cuff tear and is probably undergoing surgery.  She was prescribed yesterday 30 pills of Soma as well as 90 Lyrica.  The girlfriend is able to note that the patient was taking increased amount of her Tresa Garter throughout the day.  She believes that the patient gave up for the pills away and there is still for left in the bottle.  However the girlfriend is suspicious that the patient took the remainder in a 4-hour period between 4:00 and 8:00 AM.  However this cannot be 100% verified.  Also there is report of Xanax of 2 to 3 pills as well that may have been taken.  Also when I called the home to inquire about other medicines, the prescribed Lyrica from yesterday was not able to be found.  However there was a first scription bottle of Lyrica for 84 pills that was prescribed on 4/18 that was empty.  Also has informed that patient has a history of drug abuse especially Lyrica was specifically mentioned.  Girlfriend notes that patient had been coming increasingly tired and lethargic throughout the evening and into this morning.  Girlfriend did consciously keep waking patient to check on her noted she was more and more difficult to wake so therefore called for ambulance.  Currently emergency department patient  will open eyes to voice and painful stimuli.  There is no gross focal changes readily noted.  Therefore no further history is obtainable at this time due to patient's condition.  This applies to ROS as well.               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     No mechanical SBO - atonic bowel with 6 hour SB transit time; No COLON present    Fibromyalgia     Gastroesophageal reflux disease     Insomnia     Seasonal allergic rhinitis        Past Surgical History:   Procedure Laterality Date    APPENDECTOMY      CHOLECYSTECTOMY      Double Barrel Enterostomy  2013    for SBO    Enterostomy reversal  2014    double barrel small bowel ostomies reanastomosed    ILEOSTOMY  2011    for SBO after colectomy    ILEOSTOMY CLOSURE  2012    multiple times    LAPAROTOMY, COLECTOMY, TOTAL  2011    only a rectum remains    OVARIAN CYST SURGERY  2011-2016    several    SALPINGECTOMY, OPEN Right     2011       Family History   Problem Relation Age of Onset    Hypertension Mother     Diabetes Mother     Leukemia Father     Liver disease Maternal Grandfather     Hyperlipidemia Paternal Grandfather        Social  Social History     Tobacco Use    Smoking status: Current Every Day Smoker     Packs/day: 1.00     Types: Cigarettes    Smokeless tobacco: Never Used   Substance Use Topics    Alcohol use: Never    Drug use: Not Currently     Comment: opiates -- last use 2013       .     Allergies   Allergen Reactions    Fentanyl Anaphylaxis    Vancomycin Other (See Comments) and Edema     throat swelling and turns red     Reglan [Metoclopramide] Hives    Rocephin [Ceftriaxone] Hives    Tramadol Itching and Nausea And Vomiting    Walnuts [Tree Nuts] Other (See Comments)      diverticulitis        Home Medications     Med List Status: Unable to Assess Set By: Dara Lords, RN at 05/24/2019  9:43 AM                Acetaminophen-Codeine 300-30 MG per tablet          albuterol sulfate HFA (PROVENTIL) 108 (90 Base) MCG/ACT inhaler     Inhale 2 puffs into the lungs every 4 (four) hours as needed for Wheezing or Shortness of Breath     buprenorphine-naloxone (SUBOXONE) 8-2 MG Film     Place 1 tablet under the tongue 2 (two) times daily     carisoprodol (Soma) 350 MG tablet     Take 1 tablet (350 mg total) by mouth 2 (two) times daily as needed for Muscle spasms     clonazePAM (KlonoPIN) 0.5 MG tablet     Take 1 tablet (0.5 mg total) by mouth daily as needed for Anxiety     diclofenac sodium 3 % Gel     Apply 1 application topically 4 (four) times daily as needed (shoulder pain)     diphenoxylate-atropine (LOMOTIL) 2.5-0.025 MG per tablet     Take 1 tablet by mouth 4 (four) times daily as needed for Diarrhea     docusate sodium (COLACE) 100 MG capsule     Take 1 capsule (100 mg total) by mouth 2 (two) times daily as needed for Constipation     fluticasone-salmeterol (ADVAIR HFA) 230-21 MCG/ACT inhaler     Inhale 2 puffs into the lungs 2 (two) times daily     naloxone (NARCAN) 4 MG/0.1ML nasal spray     1 spray intranasally. If pt does not respond or relapses into respiratory depression call 911. Give additional doses every 2-3 min.     ondansetron (Zofran ODT) 4 MG disintegrating tablet     Take 1 tablet (4 mg total) by mouth every 8 (eight) hours as needed for Nausea     pregabalin (LYRICA) 150 MG capsule     Take 1 capsule (150 mg total) by mouth 3 (three) times daily     propranolol (INDERAL) 20 MG  tablet     Take 20 mg by mouth 3 (three) times daily as needed        topiramate (TOPAMAX) 25 MG tablet     Take 25 mg by mouth every 12 (twelve) hours        traZODone (DESYREL) 300 MG tablet     Take 300 mg by mouth nightly     venlafaxine (EFFEXOR-XR) 150 MG 24 hr capsule     Take 150 mg by  mouth daily     venlafaxine (EFFEXOR-XR) 75 MG 24 hr capsule     Take 75 mg by mouth daily           Review of Systems   Unable to perform ROS: Mental status change       Physical Exam    BP: 107/61, Heart Rate: (!) 111, Temp: 98.2 F (36.8 C), Resp Rate: 14, SpO2: 97 %, Weight: 60.6 kg    Physical Exam  Vitals and nursing note reviewed.   Constitutional:       General: She is not in acute distress.     Appearance: She is normal weight.   HENT:      Head: Normocephalic and atraumatic.      Right Ear: External ear normal.      Left Ear: External ear normal.      Nose: Nose normal. No congestion or rhinorrhea.      Mouth/Throat:      Mouth: Mucous membranes are moist.      Pharynx: Oropharynx is clear. No oropharyngeal exudate or posterior oropharyngeal erythema.   Eyes:      General: No scleral icterus.        Right eye: No discharge.         Left eye: No discharge.      Extraocular Movements: Extraocular movements intact.      Conjunctiva/sclera: Conjunctivae normal.      Pupils: Pupils are equal, round, and reactive to light.   Cardiovascular:      Rate and Rhythm: Regular rhythm. Tachycardia present.      Heart sounds: Normal heart sounds.   Pulmonary:      Effort: Pulmonary effort is normal. No respiratory distress.      Breath sounds: Normal breath sounds. No wheezing, rhonchi or rales.   Abdominal:      General: Abdomen is flat. There is no distension.      Palpations: Abdomen is soft.      Tenderness: There is no abdominal tenderness. There is no guarding or rebound.   Musculoskeletal:         General: No swelling, tenderness or signs of injury. Normal range of motion.      Cervical back: Normal range of motion and neck supple. No rigidity or tenderness.   Lymphadenopathy:      Cervical: No cervical adenopathy.   Skin:     General: Skin is warm and dry.      Findings: No erythema.   Neurological:      Mental Status: She is lethargic and confused.      Cranial Nerves: Cranial nerves are intact.      Sensory:  Sensation is intact. No sensory deficit.      Motor: Motor function is intact. No weakness.      Comments: Patient will respond to painful stimuli and verbal stimuli but not answering questions.  Will not follow commands as well.   Psychiatric:      Comments: Unable to assess at  this time         Labs  Results     Procedure Component Value Units Date/Time    TSH [[id number]] Collected: 05/24/19 0920    Specimen: Plasma Updated: 05/24/19 1000     TSH 1.45 uIU/mL     Acetaminophen level [[id number]] Collected: 05/24/19 0920    Specimen: Plasma Updated: 05/24/19 0945     Acetaminophen Level <0.6 mcg/mL     Salicylate level [[id number]] Collected: 05/24/19 0920    Specimen: Plasma Updated: 05/24/19 0944     Salicylate Level <5.0 mg/dL     Ethanol (Alcohol) Level [[id number]] Collected: 05/24/19 0920    Specimen: Plasma Updated: 05/24/19 0944     Alcohol <10 mg/dL     Comprehensive metabolic panel [[id number]]  (Abnormal) Collected: 05/24/19 0920    Specimen: Plasma Updated: 05/24/19 0944     Sodium 146 mMol/L      Potassium 4.4 mMol/L      Chloride 112 mMol/L      CO2 24.0 mMol/L      Calcium 9.0 mg/dL      Glucose 244 mg/dL      Creatinine 0.10 mg/dL      BUN 15 mg/dL      Protein, Total 7.3 gm/dL      Albumin 4.0 gm/dL      Alkaline Phosphatase 102 U/L      ALT 75 U/L      AST (SGOT) 21 U/L      Bilirubin, Total 0.3 mg/dL      Albumin/Globulin Ratio 1.21 Ratio      Anion Gap 14.4 mMol/L      BUN / Creatinine Ratio 16.3 Ratio      EGFR 83 mL/min/1.50m2      Osmolality Calculated 292 mOsm/kg      Globulin 3.3 gm/dL     CBC and differential [[id number]]  (Abnormal) Collected: 05/24/19 0920    Specimen: Blood Updated: 05/24/19 0933     WBC 9.0 K/cmm      RBC 4.67 M/cmm      Hemoglobin 15.4 gm/dL      Hematocrit 03.4 %      MCV 102 fL      MCH 33 pg      MCHC 32 gm/dL      RDW 74.2 %      PLT CT 294 K/cmm      MPV 8.2 fL      Neutrophils % 53.0 %      Lymphocytes 32.2 %      Monocytes 9.8 %      Eosinophils % 3.7 %       Basophils % 1.3 %      Neutrophils Absolute 4.8 K/cmm      Lymphocytes Absolute 2.9 K/cmm      Monocytes Absolute 0.9 K/cmm      Eosinophils Absolute 0.3 K/cmm      Basophils Absolute 0.1 K/cmm           Radiologic Studies  Radiology Results (24 Hour)     ** No results found for the last 24 hours. **      .    EKG Results  Last EKG Result     None        EKG Results     Procedure Component Value Units Date/Time    ECG 12 lead [[id number]] Collected: 05/24/19 0955     Updated: 05/24/19 0959     Patient Age 87 years  Patient DOB Oct 01, 1985     Patient Height --     Patient Weight --     Interpretation Text --     Sinus tachycardia  Probable left atrial enlargement  Artifact in lead(s) I,aVR,aVL,V1 and baseline wander in lead(s) I,III,aVL,aVF  Compared to ECG 03/08/2019 01:39:04  Sinus rhythm no longer present       Physician Interpreter --     Ventricular Rate 113 //min      QRS Duration 82 ms      P-R Interval 148 ms      Q-T Interval 324 ms      Q-T Interval(Corrected) 445 ms      P Wave Axis 38 deg      QRS Axis 81 deg      T Axis 49 years           MDM and ED Course     ED Medication Orders (From admission, onward)    Start Ordered     Status Ordering Provider    05/24/19 [phone] 05/24/19 0921  sodium chloride 0.9 % bolus 1,000 mL  Once in ED     Route: Intravenous  Ordered Dose: 1,000 mL     Last MAR action: New Bag Donell Sliwinski W             MDM  Patient brought to emergency department by EMS for probable polypharmacy drug overdose.  The exact amount and medications that patient has totally taken is not known.  High suspicion currently is definitely Soma as well as probably Lyrica.  Other medicines are also being considered.  Examination shows patient to respond to voice and painful stimuli.  There is no gross focal findings on neuro exam.  There appears to be no ready injuries or immediate areas of pain.    Currently patient is being treated and observed for polypharmacy drug overdose.  Other conditions are  also considered but are felt to be less likely at this time.  Poison control has been contacted and we are following their recommendations.  But due to the patient's nature of presentation and probable causes that she would definitely need monitored at a facility with higher level of care.  Therefore call has already been initiated to start to transfer to [facility].       9:55 AM Case was discussed with Dr. Reola Calkins at [facility].  He is accepted patient in transfer.  We are currently still monitoring patient and following return of labs.  Current labs that have returned have not demonstrated any acute abnormalities that will need immediate intervention.  However we will continue to monitor patient and should any changes occur we will appropriately intervene as well as notify receiving facility.    11:02 AM transport is currently at the facility.  Discussed the case with them and patient is remained stable.  Patient reassessed and no further interventions are needed at this time.  She is expected remained stable on route.  However should she change she has been transferred by a modality which can evaluate and treat any changes.      Procedures    Clinical Impression & Disposition     Clinical Impression  Final diagnoses:   Drug overdose, undetermined intent, initial encounter        ED Disposition     ED Disposition Condition Date/Time Comment    Transfer to [facility] May 24, 2019  9:56 AM  New Prescriptions    No medications on file                 Chucky May, MD  05/24/19 1103

## 2019-05-24 NOTE — Assessment & Plan Note (Signed)
A/P: by history, unclear if treated

## 2019-05-24 NOTE — ED Notes (Signed)
Dr Freida Busman spoke with [name], GF/spouse, via telephone call. Was notified that they were in [place] last night partying, pt actually sold 4 Soma at that time, so probably only took 22 pills. Unknown what happened to the Lyrica as they cannot find the bottle that was just filled, [name] does tell Dr Freida Busman that pt abuses Lyrica and she has to keep hers locked away from pt.

## 2019-05-24 NOTE — Assessment & Plan Note (Addendum)
A/P: as above under bipolar, per Behavorial health notes, patient recently on suboxone   Telemedicine visit yesterday, Lyrica 150 mg three times a day along with Zanaflex 8 mg three times a day as needed, stated during that visit "she would rather take Soma vs Zanaflex".   Medications prescribed at yesterdays visit:  clonazePAM (KlonoPIN) 0.5 MG tablet        Sig: Take 1 tablet (0.5 mg total) by mouth daily as needed for Anxiety    ondansetron (Zofran ODT) 4 MG disintegrating tablet 30 tablet 2     Sig: Take 1 tablet (4 mg total) by mouth every 8 (eight) hours as needed for Nausea    pregabalin (LYRICA) 150 MG capsule 90 capsule 0     Sig: Take 1 capsule (150 mg total) by mouth 3 (three) times daily    carisoprodol (Soma) 350 MG tablet 30 tablet 0     Hold above medications due to unknown medications ingested at time of overdose

## 2019-05-24 NOTE — Plan of Care (Signed)
Problem: Moderate/High Fall Risk Score >5  Goal: Patient will remain free of falls  Outcome: Progressing

## 2019-05-24 NOTE — Progress Notes (Signed)
Pt admitted into room 556. Pt continues to be restless, agitated and refuse care. Pt did answer orientation questions and allow for heart and lung sounds to be assessed. Refused all other portions of assessment, vitals and to be hooked up to fluids. Pt frequently asking why she's here and stating she doesn't even remember going home last night. Beverage offered but pt refuses, not eating dinner tray. Attempted to call sara, so with no success, phone goes straight to voicemail. Bed alarm on. Call bell in reach. Will continue to monitor.

## 2019-05-25 LAB — CBC AND DIFFERENTIAL
Basophils %: 0.4 % (ref 0.0–3.0)
Basophils Absolute: 0 10*3/uL (ref 0.0–0.3)
Eosinophils %: 0.4 % (ref 0.0–7.0)
Eosinophils Absolute: 0 10*3/uL (ref 0.0–0.8)
Hematocrit: 44.9 % (ref 36.0–48.0)
Hemoglobin: 14.4 gm/dL (ref 12.0–16.0)
Lymphocytes Absolute: 1.7 10*3/uL (ref 0.6–5.1)
Lymphocytes: 16.4 % (ref 15.0–46.0)
MCH: 33 pg (ref 28–35)
MCHC: 32 gm/dL (ref 32–36)
MCV: 103 fL — ABNORMAL HIGH (ref 80–100)
MPV: 8.6 fL (ref 6.0–10.0)
Monocytes Absolute: 0.9 10*3/uL (ref 0.1–1.7)
Monocytes: 9.4 % (ref 3.0–15.0)
Neutrophils %: 73.3 % (ref 42.0–78.0)
Neutrophils Absolute: 7.3 10*3/uL (ref 1.7–8.6)
PLT CT: 289 10*3/uL (ref 130–440)
RBC: 4.37 10*6/uL (ref 3.80–5.00)
RDW: 11.6 % (ref 11.0–14.0)
WBC: 10 10*3/uL (ref 4.0–11.0)

## 2019-05-25 LAB — ECG 12-LEAD
P Wave Axis: 38 deg
P-R Interval: 148 ms
Patient Age: 33 years
Q-T Interval(Corrected): 445 ms
Q-T Interval: 324 ms
QRS Axis: 81 deg
QRS Duration: 82 ms
T Axis: 49 years
Ventricular Rate: 113 //min

## 2019-05-25 LAB — BASIC METABOLIC PANEL
Anion Gap: 10 mMol/L (ref 7.0–18.0)
BUN / Creatinine Ratio: 9.1 Ratio — ABNORMAL LOW (ref 10.0–30.0)
BUN: 6 mg/dL — ABNORMAL LOW (ref 7–22)
CO2: 20 mMol/L (ref 20–30)
Calcium: 8.7 mg/dL (ref 8.5–10.5)
Chloride: 112 mMol/L — ABNORMAL HIGH (ref 98–110)
Creatinine: 0.66 mg/dL (ref 0.60–1.20)
EGFR: 117 mL/min/{1.73_m2} (ref 60–150)
Glucose: 114 mg/dL — ABNORMAL HIGH (ref 71–99)
Osmolality Calculated: 274 mOsm/kg — ABNORMAL LOW (ref 275–300)
Potassium: 4 mMol/L (ref 3.5–5.3)
Sodium: 138 mMol/L (ref 136–147)

## 2019-05-25 MED ORDER — VH BIO-K PLUS PROBIOTIC 50 BIL CFU CAPSULE
50.00 | DELAYED_RELEASE_CAPSULE | Freq: Every day | ORAL | Status: DC
Start: 2019-05-25 — End: 2019-05-27
  Administered 2019-05-25 – 2019-05-27 (×3): 50 via ORAL
  Filled 2019-05-25 (×3): qty 1

## 2019-05-25 MED ORDER — DIPHENOXYLATE-ATROPINE 2.5-0.025 MG PO TABS
1.0000 | ORAL_TABLET | Freq: Three times a day (TID) | ORAL | Status: DC | PRN
Start: 2019-05-25 — End: 2019-05-27
  Administered 2019-05-25 – 2019-05-27 (×6): 1 via ORAL
  Filled 2019-05-25 (×6): qty 1

## 2019-05-25 MED ORDER — MELATONIN 3 MG PO TABS
3.00 mg | ORAL_TABLET | Freq: Once | ORAL | Status: AC
Start: 2019-05-25 — End: 2019-05-25
  Administered 2019-05-25: 22:00:00 3 mg via ORAL
  Filled 2019-05-25 (×2): qty 1

## 2019-05-25 MED ORDER — CIPROFLOXACIN HCL 500 MG PO TABS
500.0000 mg | ORAL_TABLET | Freq: Two times a day (BID) | ORAL | Status: DC
Start: 2019-05-25 — End: 2019-05-27
  Administered 2019-05-25 – 2019-05-27 (×5): 500 mg via ORAL
  Filled 2019-05-25 (×6): qty 1

## 2019-05-25 MED ORDER — CLONAZEPAM 0.5 MG PO TABS
0.2500 mg | ORAL_TABLET | Freq: Two times a day (BID) | ORAL | Status: DC | PRN
Start: 2019-05-25 — End: 2019-05-27
  Administered 2019-05-25 – 2019-05-27 (×7): 0.25 mg via ORAL
  Filled 2019-05-25 (×7): qty 1

## 2019-05-25 MED ORDER — TRAZODONE HCL 100 MG PO TABS
300.0000 mg | ORAL_TABLET | Freq: Every evening | ORAL | Status: DC
Start: 2019-05-25 — End: 2019-05-25

## 2019-05-25 NOTE — Plan of Care (Addendum)
NURSE NOTE SUMMARY  [facility] - [facility] PULMONARY RENAL UNIT   Patient Name: [name],[name]   Attending Physician: Arvella Merles, Emerencie*   Today's date:   05/25/2019 LOS: 1 days   Shift Summary:                                                              Uneventful shift. VSS. Assessment as noted. meds given as ordered. Pt has been more cooperative today, allowing a full assessment and vitals. Prn clonazepam administered per request. Pt more oriented and steadier on her feet today. Constantly pacing in room. She states she does not remember anything past Friday evening when the car broke down and doesn't know why she's here despite being told multiple times. Denies needs at this time. Call bell in reach. Will monitor through end of shift.      Provider Notifications:        Rapid Response Notifications:  Mobility:      PMP Activity: Step 6 - Walks in Room (05/25/2019  9:29 AM)     Weight tracking:  Family Dynamic:   Last 3 Weights for the past 72 hrs (Last 3 readings):   Weight   05/24/19 1300 58.4 kg (128 lb 12 oz)             Last Bowel Movement   Last BM Date: 05/25/19        Problem: Moderate/High Fall Risk Score >5  Goal: Patient will remain free of falls  Outcome: Progressing  Flowsheets  Taken 05/25/2019 0929 by Garth Bigness, RN  VH High Risk (Greater than 13):   PATIENT IS TO BE SUPERVISED FOR ALL TOILETING ACTIVITIES   A CHAIR PAD ALARM WILL BE USED WHEN PATIENT IS UP SITTING IN A CHAIR   BED ALARM WILL BE ACTIVATED WHEN THE PATEINT IS IN BED WITH SIGNAGE "RESET BED ALARM"   RED "HIGH FALL RISK" SIGNAGE   ALL REQUIRED MODERATE INTERVENTIONS   ALL REQUIRED LOW INTERVENTIONS  Taken 05/24/2019 2134 by Esmond Camper, RN  High (Greater than 13):   LOW-Fall Interventions Appropriate for Low Fall Risk   LOW-Anticoagulation education for injury risk   MOD-Use of chair-pad alarm when appropriate   MOD-Consider a move closer to [employer]   MOD-Remain with patient during  toileting   MOD-Use of assistive devices -Bedside Commode if appropriate   MOD-Re-orient confused patients   MOD-Utilize diversion activities   MOD-Request PT/OT consult order for patients with gait/mobility impairment   MOD-Include family in multidisciplinary POC discussions   MOD-Place Fall Risk level on whiteboard in room   HIGH-Visual cue at entrance to patient's room   HIGH-Bed alarm on at all times while patient in bed   HIGH-Utilize chair pad alarm for patient while in the chair   HIGH-Apply yellow "Fall Risk" arm band   HIGH-Pharmacy to initiate evaluation and intervention per protocol   HIGH-Initiate use of floor mats as appropriate   HIGH-Consider use of low bed

## 2019-05-25 NOTE — Teleconsult (Signed)
Spoke with RN.    Pt awake, alert now with loose stools 3-4x overnight.  C/o abdominal cramping.    On lomotil as an outpatient.  Will resume.

## 2019-05-25 NOTE — Progress Notes (Signed)
Telemedicine cross cover:  Pt requesting medication for sleep. Takes 300 mg trazodone at home.   - chart reviewed, pt admitted with encephalopathy with concern for drug overdose.   - will try melatonin 3 mg

## 2019-05-25 NOTE — Progress Notes (Signed)
[facility] RN following up on pt transferred out of [facility]. Checked w/ primary RN - No new concerns. Pt has been more cooperative and less anxious. Pt is having a small amount of diarrhea, but otherwise no changes to report per primary RN. Pt resting. VS as follows:   Vitals:    05/24/19 2311   BP: 119/78   Pulse: 78   Resp: 20   Temp: 97 F (36.1 C)   SpO2: 97%
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
## 2019-05-26 ENCOUNTER — Other Ambulatory Visit (RURAL_HEALTH_CENTER): Payer: Self-pay

## 2019-05-26 ENCOUNTER — Ambulatory Visit (RURAL_HEALTH_CENTER): Payer: Self-pay

## 2019-05-26 LAB — CBC AND DIFFERENTIAL
Basophils %: 0.6 % (ref 0.0–3.0)
Basophils Absolute: 0.1 10*3/uL (ref 0.0–0.3)
Eosinophils %: 0.9 % (ref 0.0–7.0)
Eosinophils Absolute: 0.1 10*3/uL (ref 0.0–0.8)
Hematocrit: 42.3 % (ref 36.0–48.0)
Hemoglobin: 14.8 gm/dL (ref 12.0–16.0)
Lymphocytes Absolute: 1.8 10*3/uL (ref 0.6–5.1)
Lymphocytes: 18.3 % (ref 15.0–46.0)
MCH: 35 pg (ref 28–35)
MCHC: 35 gm/dL (ref 32–36)
MCV: 100 fL (ref 80–100)
MPV: 8.7 fL (ref 6.0–10.0)
Monocytes Absolute: 1.2 10*3/uL (ref 0.1–1.7)
Monocytes: 12.4 % (ref 3.0–15.0)
Neutrophils %: 67.9 % (ref 42.0–78.0)
Neutrophils Absolute: 6.5 10*3/uL (ref 1.7–8.6)
PLT CT: 273 10*3/uL (ref 130–440)
RBC: 4.21 10*6/uL (ref 3.80–5.00)
RDW: 11.7 % (ref 11.0–14.0)
WBC: 9.6 10*3/uL (ref 4.0–11.0)

## 2019-05-26 LAB — ECG 12-LEAD
P Wave Axis: 48 deg
P-R Interval: 138 ms
Patient Age: 33 years
Q-T Interval(Corrected): 441 ms
Q-T Interval: 354 ms
QRS Axis: 80 deg
QRS Duration: 87 ms
T Axis: 66 years
Ventricular Rate: 93 //min

## 2019-05-26 LAB — BASIC METABOLIC PANEL
Anion Gap: 17 mMol/L (ref 7.0–18.0)
BUN / Creatinine Ratio: 9.2 Ratio — ABNORMAL LOW (ref 10.0–30.0)
BUN: 7 mg/dL (ref 7–22)
CO2: 16.6 mMol/L — ABNORMAL LOW (ref 20.0–30.0)
Calcium: 9.3 mg/dL (ref 8.5–10.5)
Chloride: 110 mMol/L (ref 98–110)
Creatinine: 0.76 mg/dL (ref 0.60–1.20)
EGFR: 104 mL/min/{1.73_m2} (ref 60–150)
Glucose: 110 mg/dL — ABNORMAL HIGH (ref 71–99)
Osmolality Calculated: 276 mOsm/kg (ref 275–300)
Potassium: 4.6 mMol/L (ref 3.5–5.3)
Sodium: 139 mMol/L (ref 136–147)

## 2019-05-26 MED ORDER — LORAZEPAM 0.5 MG PO TABS
0.50 mg | ORAL_TABLET | Freq: Once | ORAL | Status: AC
Start: 2019-05-26 — End: 2019-05-26
  Administered 2019-05-26: 16:00:00 0.5 mg via ORAL
  Filled 2019-05-26: qty 1

## 2019-05-26 MED ORDER — PRAZOSIN HCL 1 MG PO CAPS
1.00 mg | ORAL_CAPSULE | Freq: Every evening | ORAL | Status: DC
Start: 2019-05-26 — End: 2019-05-27
  Administered 2019-05-26: 21:00:00 1 mg via ORAL
  Filled 2019-05-26 (×2): qty 1

## 2019-05-26 MED ORDER — BUPRENORPHINE HCL-NALOXONE HCL 8-2 MG SL SUBL
1.00 | SUBLINGUAL_TABLET | Freq: Two times a day (BID) | SUBLINGUAL | Status: DC
Start: 2019-05-26 — End: 2019-05-27
  Administered 2019-05-26 – 2019-05-27 (×2): 1 via SUBLINGUAL
  Filled 2019-05-26 (×2): qty 1

## 2019-05-26 NOTE — Telephone Encounter (Signed)
Requesting diclofenac sodium 3% gel   Last OV - 05/23/19  Has resulted labs from - 05/26/19  Last prescribed on - n/a   Next followup visit - 06/02/19

## 2019-05-26 NOTE — Progress Notes (Signed)
Medicine Progress Note   [facility]  [employer]   Patient Name: [name],[name] LOS: 2 days   Attending Physician: Jonn Shingles, MD PCP: Kathi Simpers, NP      Hospital Course:                                                            Leonard Schwartz Abby Tucholski is a 34 y.o. female patient with hx fibromyalgia, bipolar 1 disorder, general anxiety disorder, insomnia, prior hepatitis C, current smoker/tobacco user who was admitted to [facility] from [facility] for poly-subpharmacy overdose.  Patient was aggressively hydrated.  Over time patient's mental status improved.  Patient still had some short-term memory loss.  Patient was seen hemodynamically respiratory stable to transition out of the intensive care unit on 05/24/19.    5/3: Pt. reportedly on Suboxone, Soma, Tylenol #3, started no on Zanaflex/Tizanadine, Xanax, and has been prescribed these by different providers as well as her pain-service team. Pt. not able to clearly articulate why she did this and does not fully remember. Does claim taht over the last two months she has had worsened depression and anxiety which have caused her thoughts of self-harm. D/w Psych team who are assessing her + collateral information to determine if BHS required.  In the meantime, will tr to find minimum combination of meds needed for "adequate" management of pain and anxiety. Currently a little anxious on the BID klonopin dose. Reporting some chronic pain, but not clinically consistent with her appearnce.     Assessment and Plan:      Acute metabolic encephalopathy-improving  Unintentional Overdose  Bipolar 1 disorder  History of generalized anxiety disorder  Insomnia  Fibromyalgia  Concern for polypharmacy  Patient followed in outpatient setting by [facility] behavioral health.    Holding home meds given unknown ingestion.  Pt. reportedly on Suboxone, Soma, Tylenol #3, started no on Zanaflex/Tizanadine, Xanax, and  has been prescribed these by different providers as well as her pain-service team. Pt. not able to clearly articulate why she did this and does not fully remember. Does claim taht over the last two months she has had worsened depression and anxiety which have caused her thoughts of self-harm. D/w Psych team who are assessing her + collateral information to determine if BHS required.  In the meantime, will tr to find minimum combination of meds needed for "adequate" management of pain and anxiety. Currently a little anxious on the BID klonopin dose. Reporting some chronic pain, but not clinically consistent with her appearnce.    Appreciate Psych team's assistance both with med adjustment/optimization as well as evaluation for BHS admit needs if any.Marland Kitchen    #Tobacco Abuse  Pt. counseled for 4+ minutes on smoking cessation. Current use explored along with assessment of willingness to quit and discussion of appropriate f/u.  NRT will be provided during patient's stay as needed/desired to assist.       Chronic abdominal pain with history of Crohn's disease  Chronic hepatitis C  -Patient has been under the care of [facility].  Surgical history dating back to 2011 in [place].  -Unclear if patient ever been treated for hepatitis C  -Denies any acute abdominal pain or nausea reported.  Encourage p.o. diet  -Encourage follow-up with outpatient [place] [facility]      #  Acute E Coli UTI  Pt. unable to manage IV Ceftriaxone. Still awiating final cultures and coveirng w/ ciprofloxacin empriicallu       Disposition: ?Psych vs. Home w/ adjusted meds.  DVT PPX:  Lovenox  Code:  Full Code     Subjective   "I feel lousy"      Objective   Physical Exam:     Vitals: T:98.1 F (36.7 C) (Temporal), BP:111/64, HR:61, RR:16, SaO2:98%    General: Patient is awake. In no acute distress.tired-appearing.   HEENT: No conjunctival drainage, vision is intact, anicteric sclera.  Neck: Supple, no thyromegaly.  Chest: CTA bilaterally. No rhonchi, no wheezing. No use  of accessory muscles.  CVS: Normal rate and regular rhythm no murmurs, without JVD, no pitting edema, pulses palpable.  Abdomen: Soft, non-tender, no guarding or rigidity, with normal bowel sounds.  Extremities: No focal tenderness or witnessed painful reaction. No calf swelling and no gross deformity.  Skin: Warm, dry, no rash and no worrisome lesions.  NEURO: No motor or sensory deficits.  Psychiatric: Alert, interactive. Mood lability exhibiting tearful response, laughter, and depressed affect all in just a few minutes. Endorses thoughts of self-harm over the last two months that "are not like me"  Weight Monitoring 03/18/2019 04/01/2019 05/03/2019 05/03/2019 05/12/2019 05/24/2019 05/24/2019   Height - 165.1 cm 165.1 cm 165.1 cm 165.1 cm - 165.1 cm   Height Method - - Stated Stated Stated - -   Weight 60.147 kg 59.875 kg 58.877 kg 58.06 kg 58.06 kg 60.646 kg 58.4 kg   Weight Method - - Actual Stated Stated Bed Scale Bed Scale   BMI (calculated) - 22 kg/m2 21.6 kg/m2 21.3 kg/m2 21.3 kg/m2 - 21.5 kg/m2         Intake/Output Summary (Last 24 hours) at 05/26/2019 1444  Last data filed at 05/26/2019 0310  Gross per 24 hour   Intake 90 ml   Output 600 ml   Net -510 ml     Body mass index is 21.42 kg/m.     Meds:     Current Facility-Administered Medications   Medication Dose Route Frequency    ciprofloxacin  500 mg Oral Q12H SCH    enoxaparin  40 mg Subcutaneous Q24H    lactobacillus species  50 Billion CFU Oral Daily    nicotine  1 patch Transdermal Daily    nursing care medication protocol placeholder  1 each Does not apply See Admin Instructions    prazosin  1 mg Oral QHS    sodium chloride (PF)  3 mL Intravenous Q8H      lactated ringers Stopped (05/24/19 1823)     PRN Meds: clonazePAM, diphenoxylate-atropine.     LABS:     Estimated Creatinine Clearance: 95.6 mL/min (based on SCr of 0.76 mg/dL).  Recent Labs   Lab 05/25/19  0631 05/24/19  2020   WBC 10.0 10.7   RBC 4.37 3.97   Hemoglobin 14.4 13.6   Hematocrit 44.9  40.5   MCV 103* 102*   PLT CT 289 246             No results found for: HGBA1CPERCNT  Recent Labs   Lab 05/26/19  1129 05/25/19  0631 05/24/19  1555 05/24/19  1555   Glucose 110* 114*  More results in Results Review 123*   Sodium 139 138  More results in Results Review 145   Potassium 4.6 4.0  More results in Results Review 4.1   Chloride 110 112*  More results in Results Review 117*   CO2 16.6* 20  More results in Results Review 20   BUN 7 6*  More results in Results Review 10   Creatinine 0.76 0.66  More results in Results Review 0.69   EGFR 104 117  --  116   Calcium 9.3 8.7  More results in Results Review 7.9*   More results in Results Review = values in this interval not displayed.     Recent Labs   Lab 05/24/19  1555 05/24/19  0920   Magnesium 1.8  --    Phosphorus 2.3  --    Albumin 3.4* 4.0   Protein, Total 6.2 7.3   Bilirubin, Total 0.3 0.3   Alkaline Phosphatase 91 102   ALT 62* 75*   AST (SGOT) 23 21     Recent Labs   Lab 05/24/19  1315 05/24/19  1035 05/24/19  1035   Urine Specific Gravity 1.018  More results in Results Review 1.025   pH, Urine 6.1  More results in Results Review 6.5   Protein, UR  --   --  Negative   Glucose, UA  --   --  Negative   Ketones UA  --   --  Trace*   Bilirubin, UA  --   --  Negative   Blood, UA  --   --  Negative   Nitrite, UA  --   --  Positive*   Urobilinogen, UA  --   --  0.2   Leukocyte Esterase, UA  --   --  Negative   WBC, UA  --   --  3-4   RBC, UA  --   --  3-4   Bacteria, UA  --   --  Many*   More results in Results Review = values in this interval not displayed.      Patient Lines/Drains/Airways Status    Active PICC Line / CVC Line / PIV Line / Drain / Airway / Intraosseous Line / Epidural Line / ART Line / Line / Wound / Pressure Ulcer / NG/OG Tube     Name:   Placement date:   Placement time:   Site:   Days:    Peripheral IV 05/24/19 24 G Left Forearm   05/24/19    0920    Forearm   less than 1               No results found.   Home Health Needs:  There are no  questions and answers to display.       Nutrition assessment done in collaboration with Registered Dietitians:     Time spent:      Jonn Shingles, MD     05/26/19,2:44 PM   MRN: [id number]                                      CSN: [id number] DOB: January 27, 1985

## 2019-05-26 NOTE — Plan of Care (Signed)
NURSE NOTE SUMMARY  [facility] - [facility] PULMONARY RENAL UNIT   Patient Name: [name],[name]   Attending Physician: Arvella Merles, Emerencie*   Today's date:   05/26/2019 LOS: 2 days   Shift Summary:                                                              Patient requested PRN lomotil twice during shift, requested Klonopin one time during shift. Patient also requested having medication for sleep. Reached out to telemed and melatonin was ordered. Patient reports having moments of feeling really cold and really hot during night. Patient reports intermittently sleeping during shift. Reports no other needs at this time.      Provider Notifications:        Rapid Response Notifications:  Mobility:      PMP Activity: Step 6 - Walks in Room (05/26/2019 12:00 AM)     Weight tracking:  Family Dynamic:   Last 3 Weights for the past 72 hrs (Last 3 readings):   Weight   05/24/19 1300 58.4 kg (128 lb 12 oz)             Last Bowel Movement   Last BM Date: 05/25/19       Problem: Moderate/High Fall Risk Score >5  Description: Fall Risk Score > 5  Goal: Patient will remain free of falls  Outcome: Progressing

## 2019-05-26 NOTE — Progress Notes (Signed)
Readmission Risk  [facility] PULMONARY RENAL UNIT   Patient Name: [name],[name]   Attending Physician: Jonn Shingles, MD   Today's date:   05/26/2019 LOS: 2 days   Expected Discharge Date      Readmission Assessment:                                                              Discharge Planning  ReAdmit Risk Score: 29  Does the patient have perscription coverage?: Yes  Utilize [facility]: No  Confirmed PCP with Pt: Yes  Confirmed PCP name: Marja Kays  Last PCP Visit Date: 05/23/2019  Confirm Transport to F/U Appt.: Self/Private Vehicle/Friend  Social Work Referral: Yes, Consult Placed(Yes, consult NOT placed)  Anticipated Home Health at [place]: No  Anticipated Placement at [place]: No  CM Comments: (P) SW 5/3 SM: Patient admitted for unintentional overdose. E coli UTI. Hx of Crohns disease, chronic Hep C. Started on klonopin, Cipro. Psychiatry following. Unsure if BHS transfer needed. PTA: independent with ADLs, lives with girlfriend in home of someone they are caring for, pt does not drive. SW attempted to call girlfriend, [name], to confirm if pt can return; had to leave VM. Anticipate dispo home to girlfriend with Medicaid transport when medically stable. SW following.       IDPA:   Patient Type  Within 30 Days of Previous Admission?: Yes  Healthcare Decisions  Interviewed:: Patient  Orientation/Decision Making Abilities of Patient: Alert and Oriented x3, able to make decisions  Advance Directive: Patient does not have advance directive  Healthcare Agent Appointed: No  Prior to admission  Prior level of function: Independent with ADLs  Type of Residence: Private residence  Home Layout: Multi-level, Stairs to enter with rails (add number in comment)(3 STE)  Have running water, electricity, heat, etc?: Yes  Living Arrangements: Spouse/significant other, Children, Other (Comment)  How do you get to your MD appointments?: Girlfriend  How do you get your groceries?: Girlfriend  Who fixes  your meals?: Self  Who does your laundry?: Self  Who picks up your prescriptions?: Self  Dressing: Independent  Grooming: Independent  Feeding: Independent  Bathing: Independent  Toileting: Independent  Home Care/Community Services: None  Discharge Planning  Support Systems: Spouse/significant other, Children, Family members  Patient expects to be discharged to:: Home  Anticipated [place] plan discussed with:: Same as interviewed  Mode of transportation:: Medicaid transport  Does the patient have perscription coverage?: Yes  Consults/Providers  PT Evaluation Needed: No  OT Evalulation Needed: No  SLP Evaluation Needed: No  Correct PCP listed in Epic?: Yes  Family and PCP  PCP on file was verified as the current PCP?: Yes   30 Day Readmission:   Readmission Patient Interview/Contributing Factors  Patient active with Home Health?: No, not house-bound previously  Patient active with home hospice?: No  Was patient readmitted from a facility?: Not readmitted from a facility   Provider Notifications:        Gerrit Heck, BSW  Social Worker  [phone]
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
## 2019-05-26 NOTE — UM Notes (Signed)
[place] [employer]    [facility]  Utilization Intake Form                       Date: 05-26-2019                   AUTHORIZATION #__________________________________     Patient type  Inpatient Admission Type:   Inpatient      Patient Name:   [name]     Address:   [address]  [place]  [address]    Telephone#:  [phone]    Medicaid #:  [id number]        SS#:    DOB:  06/09/1985    Admitting MD:   Dr Suezanne Jacquet       Admit Date:  05/24/2019    1228  Pm      Hospital:  [facility], [address]  [place], [place]  [address]    Diagnosis:  Drug over dose  T50.901 A      Procedure:      OB Delivery Date:  ____/____/____  Sex: (circle) Female   Female    Babys Name:  __________________________________________________________________    Weight:  __________, Apgar:  ___________,  and EGA:  ____________Grams:____________    Pediatrician:  ____________________________________________________________________    Contact Person:  Gavin Potters , RN   Telephone #:  [phone]  Fax#:  [phone]      Fax completed form to  [employer] at [phone]  To contact [employer] Staff for Demographics at [phone]

## 2019-05-27 LAB — CBC AND DIFFERENTIAL
Basophils %: 0.9 % (ref 0.0–3.0)
Basophils Absolute: 0.1 10*3/uL (ref 0.0–0.3)
Eosinophils %: 1.8 % (ref 0.0–7.0)
Eosinophils Absolute: 0.2 10*3/uL (ref 0.0–0.8)
Hematocrit: 44.2 % (ref 36.0–48.0)
Hemoglobin: 15.6 gm/dL (ref 12.0–16.0)
Lymphocytes Absolute: 2.2 10*3/uL (ref 0.6–5.1)
Lymphocytes: 23.9 % (ref 15.0–46.0)
MCH: 36 pg — ABNORMAL HIGH (ref 28–35)
MCHC: 35 gm/dL (ref 32–36)
MCV: 101 fL — ABNORMAL HIGH (ref 80–100)
MPV: 8.5 fL (ref 6.0–10.0)
Monocytes Absolute: 1 10*3/uL (ref 0.1–1.7)
Monocytes: 11.2 % (ref 3.0–15.0)
Neutrophils %: 62.3 % (ref 42.0–78.0)
Neutrophils Absolute: 5.8 10*3/uL (ref 1.7–8.6)
PLT CT: 260 10*3/uL (ref 130–440)
RBC: 4.4 10*6/uL (ref 3.80–5.00)
RDW: 11.8 % (ref 11.0–14.0)
WBC: 9.3 10*3/uL (ref 4.0–11.0)

## 2019-05-27 LAB — BASIC METABOLIC PANEL
Anion Gap: 16.3 mMol/L (ref 7.0–18.0)
BUN / Creatinine Ratio: 14.7 Ratio (ref 10.0–30.0)
BUN: 11 mg/dL (ref 7–22)
CO2: 18 mMol/L — ABNORMAL LOW (ref 20–30)
Calcium: 9.1 mg/dL (ref 8.5–10.5)
Chloride: 111 mMol/L — ABNORMAL HIGH (ref 98–110)
Creatinine: 0.75 mg/dL (ref 0.60–1.20)
EGFR: 106 mL/min/{1.73_m2} (ref 60–150)
Glucose: 105 mg/dL — ABNORMAL HIGH (ref 71–99)
Osmolality Calculated: 281 mOsm/kg (ref 275–300)
Potassium: 4.3 mMol/L (ref 3.5–5.3)
Sodium: 141 mMol/L (ref 136–147)

## 2019-05-27 MED ORDER — CIPROFLOXACIN HCL 500 MG PO TABS
500.0000 mg | ORAL_TABLET | Freq: Two times a day (BID) | ORAL | 0 refills | Status: DC
Start: 2019-05-27 — End: 2019-05-27

## 2019-05-27 MED ORDER — CIPROFLOXACIN HCL 500 MG PO TABS
500.00 mg | ORAL_TABLET | Freq: Two times a day (BID) | ORAL | 0 refills | Status: AC
Start: 2019-05-27 — End: 2019-05-30

## 2019-05-27 MED ORDER — PREGABALIN 75 MG PO CAPS
75.00 mg | ORAL_CAPSULE | Freq: Three times a day (TID) | ORAL | 0 refills | Status: DC
Start: 2019-05-27 — End: 2019-05-27

## 2019-05-27 NOTE — Progress Notes (Signed)
Quick Doc  [facility] PULMONARY RENAL UNIT   Patient Name: [name]   Attending Physician: Karren Cobble, *   Today's date:   05/27/2019 LOS: 3 days   Expected Discharge Date      Quick  Assessment:                                                              ReAdmit Risk Score: 29    CM Comments: (P) SW 5/4 SM: Patient medically ready for discharge. SW discussed plan with patient. She will return home with girlfriend. Medicaid taxi arranged and can take 3 hours; ETA ~1700. Reservation # [id number]. Patient does have follow up with [facility] on 5/11. No other SW needs at this time.    Physical Discharge Disposition: (P) Home                                   Mode of Transportation: (P) Taxi     Patient/Family/POA notified of transfer plan: (P) Yes        Bedside nurse notified of transport plan?: (P) Yes                       Physical Discharge Disposition: (P) Home       Provider Notifications:        Gerrit Heck, BSW  Social Worker  [phone]

## 2019-05-27 NOTE — Discharge Summary (Signed)
Medicine Discharge Summary   [facility]  [facility]   Patient Name: [name],[name]   Attending Physician: Karren Cobble, * PCP: Kathi Simpers, NP   Date of Admission: 05/24/2019 D/C Date: 05/27/2019   Discharge Diagnoses:     Drug overdose  UTI     Hospital Course       [name] is a 34 y.o. female  with hx fibromyalgia, bipolar 1 disorder, general anxiety disorder, insomnia, prior hepatitis C, current smoker/tobacco user who was admitted to [facility] for poly-subpharmacy overdose. Patient was aggressively hydrated. Over time patient'smental status improved. Patient still had some short-term memory loss.Patient was seen hemodynamically respiratory stable to transition out of the intensive care unit on 05/24/19.Pt. reportedly on Suboxone, Soma, Tylenol #3, started no on Zanaflex/Tizanadine, Xanax, and has been prescribed these by different providers as well as her pain-service team. Pt. not able to clearly articulate why she did this and does not fully remember. She was seen by psychiatry and no need for in patient psychiatry treatment.We stopped her soma,Tylenol#3 and decreased her lyrica to 75 mg po tid.Patient will follow with Dr.Nardeli as an out patient.  Her urine culutre grew e.coli and treated her with ceftriaxone first and later on with cipro.       Pending Results and other significant studies:  No     Discharge Instructions:          Disposition: Home  Diet: Regular Diet  Activity: As tolerated  Discharge Code Status: Full Code    Kathi Simpers, NP  [address] [place] [address]  [phone]    Follow up  Your hospital follow-up visit is for Monday May 10 at 9:20.  PLEASE call the office if you need to change or cancel this appointment.       Discharge Medications:                                                                        Discharge Medication List      Taking    albuterol  sulfate HFA 108 (90 Base) MCG/ACT inhaler  Dose: 2 puff  Commonly known as: PROVENTIL  Inhale 2 puffs into the lungs every 4 (four) hours as needed for Wheezing or Shortness of Breath     buprenorphine-naloxone 8-2 MG Film  Dose: 1 tablet  Commonly known as: SUBOXONE  Place 1 tablet under the tongue 2 (two) times daily     ciprofloxacin 500 MG tablet  Dose: 500 mg  Commonly known as: CIPRO  For: Bladder Inflammation  Take 1 tablet (500 mg total) by mouth every 12 (twelve) hours for 3 days     clonazePAM 0.5 MG tablet  Dose: 0.5 mg  Commonly known as: KlonoPIN  Take 1 tablet (0.5 mg total) by mouth daily as needed for Anxiety     * diclofenac sodium 3 % Gel  Dose: 1 application  Apply 1 application topically 4 (four) times daily as needed (shoulder pain)     * diclofenac sodium 3 % Gel  Apply topically     diphenoxylate-atropine 2.5-0.025 MG per tablet  Dose: 1 tablet  Commonly known as: LOMOTIL  Take  1 tablet by mouth 4 (four) times daily as needed for Diarrhea     docusate sodium 100 MG capsule  Dose: 100 mg  Commonly known as: COLACE  Take 1 capsule (100 mg total) by mouth 2 (two) times daily as needed for Constipation     fluticasone-salmeterol 230-21 MCG/ACT inhaler  Dose: 2 puff  Commonly known as: ADVAIR HFA  Inhale 2 puffs into the lungs 2 (two) times daily     naloxone 4 MG/0.1ML nasal spray  Commonly known as: NARCAN  For: Opioid Overdose  1 spray intranasally. If pt does not respond or relapses into respiratory depression call 911. Give additional doses every 2-3 min.     ondansetron 4 MG disintegrating tablet  Dose: 4 mg  Commonly known as: Zofran ODT  Take 1 tablet (4 mg total) by mouth every 8 (eight) hours as needed for Nausea     propranolol 20 MG tablet  Dose: 20 mg  Commonly known as: INDERAL  Take 20 mg by mouth 3 (three) times daily as needed      topiramate 25 MG tablet  Dose: 25 mg  Commonly known as: TOPAMAX  Take 25 mg by mouth every 12 (twelve) hours      traZODone 300 MG tablet  Dose: 300  mg  Commonly known as: DESYREL  Take 300 mg by mouth nightly     venlafaxine 150 MG 24 hr capsule  Dose: 150 mg  What changed: Another medication with the same name was removed. Continue taking this medication, and follow the directions you see here.  Commonly known as: EFFEXOR-XR  Take 150 mg by mouth daily         * This list has 2 medication(s) that are the same as other medications prescribed for you. Read the directions carefully, and ask your doctor or other care provider to review them with you.            STOP taking these medications    Acetaminophen-Codeine 300-30 MG per tablet     carisoprodol 350 MG tablet  Commonly known as: Soma     pregabalin 150 MG capsule  Commonly known as: LYRICA             Discharge Day Exam (05/27/2019):     Blood pressure 92/60, pulse (!) 104, temperature 97.3 F (36.3 C), temperature source Temporal, resp. rate 17, height 1.651 m (5\' 5" ), weight 58.4 kg (128 lb 12 oz), SpO2 97 %.      General: Patient is awake. In no acute distress.  Chest: CTA bilaterally. No rhonchi, no wheezing. No use of accessory muscles.  CVS: Normal rate and regular rhythm no murmurs, without JVD.  Abdomen: Soft, non-tender, no guarding or rigidity, with normal bowel sounds.  Extremities: No pitting edema, pulses palpable, no calf swelling and no gross deformity.  Skin: Warm, dry  NEURO: No motor or sensory deficits.     Recent Labs      Recent Labs   Lab 05/27/19  0850 05/26/19  1425 05/25/19  0631 05/24/19  2020 05/24/19  0920   WBC 9.3 9.6 10.0 10.7 9.0   RBC 4.40 4.21 4.37 3.97 4.67   Hemoglobin 15.6 14.8 14.4 13.6 15.4   Hematocrit 44.2 42.3 44.9 40.5 47.5   MCV 101* 100 103* 102* 102*   PLT CT 260 273 289 246 294             No results found for: HGBA1CPERCNT  Recent Labs  Lab 05/27/19  0850 05/26/19  1129 05/25/19  0631 05/25/19  0631 05/24/19  1555 05/24/19  1555 05/24/19  0920 05/24/19  0920   Glucose 105* 110*  --  114*  --  123*  --  102*   Sodium 141 139  --  138  --  145  --  146    Potassium 4.3 4.6  More results in Results Review 4.0  More results in Results Review 4.1  More results in Results Review 4.4   Chloride 111* 110  --  112*  --  117*  --  112*   CO2 18* 16.6*  --  20  --  20  --  24.0   BUN 11 7  --  6*  --  10  --  15   Creatinine 0.75 0.76  --  0.66  --  0.69  --  0.92   EGFR 106 104  --  117  --  116  --  83   Calcium 9.1 9.3  --  8.7  --  7.9*  --  9.0   More results in Results Review = values in this interval not displayed.     Recent Labs   Lab 05/24/19  1555 05/24/19  0920   Magnesium 1.8  --    Phosphorus 2.3  --    Albumin 3.4* 4.0   Protein, Total 6.2 7.3   Bilirubin, Total 0.3 0.3   Alkaline Phosphatase 91 102   ALT 62* 75*   AST (SGOT) 23 21        Allergies:      Fentanyl, Vancomycin, Reglan [metoclopramide], Rocephin [ceftriaxone], Tramadol, and Walnuts [tree nuts]   Time spent on discharging the patient:  45 minutes   No results found.   Home Health Needs:  There are no questions and answers to display.      Karren Cobble, MD         05/27/19 1:58 PM   MRN: [id number]                                      CSN: [id number] DOB: [birth year]

## 2019-05-27 NOTE — Plan of Care (Signed)
NURSE NOTE SUMMARY  [facility] PULMONARY RENAL UNIT   Patient Name: [name]   Attending Physician: Jonn Shingles, MD   Today's date:   05/27/2019 LOS: 3 days   Shift Summary:                                                              Patient received medications per [facility] with no adverse reactions. Patient received PRN medications per MAR. Lost patient's IV access this morning, when asked patient did not wish to have another IV. Patient also asked morning labs to be drawn later since she had just fallen asleep. Otherwise uneventful shift.      Provider Notifications:        Rapid Response Notifications:  Mobility:      PMP Activity: Step 6 - Walks in Room (05/26/2019  7:51 PM)     Weight tracking:  Family Dynamic:   Last 3 Weights for the past 72 hrs (Last 3 readings):   Weight   05/24/19 1300 58.4 kg (128 lb 12 oz)             Last Bowel Movement   Last BM Date: 05/25/19       Problem: Moderate/High Fall Risk Score >5  Description: Fall Risk Score > 5  Goal: Patient will remain free of falls  Outcome: Progressing

## 2019-05-27 NOTE — Discharge Instr - Diet (Signed)
As tolerated

## 2019-05-27 NOTE — Discharge Instr - Activity (Addendum)
As tolerated

## 2019-05-27 NOTE — Plan of Care (Signed)
NURSE NOTE SUMMARY  [facility] - [facility] PULMONARY RENAL UNIT   Patient Name: [name]   Attending Physician: Karren Cobble, *   Today's date:   05/27/2019 LOS: 3 days   Shift Summary:                                                              Shift uneventful. VSS. Pt resting in bed for most of the morning. Assessment as noted. PRN Klonopin given this morning. Denies any needs. Call bell is within reach. Discharge is anticipated.      Provider Notifications:        Rapid Response Notifications:  Mobility:      PMP Activity: Step 6 - Walks in Room (05/27/2019  8:45 AM)     Weight tracking:  Family Dynamic:   No data found.          Last Bowel Movement   Last BM Date: 05/25/19

## 2019-05-29 ENCOUNTER — Other Ambulatory Visit (RURAL_HEALTH_CENTER): Payer: Self-pay | Admitting: Nurse Practitioner

## 2019-05-29 DIAGNOSIS — F5101 Primary insomnia: Secondary | ICD-10-CM

## 2019-06-02 ENCOUNTER — Inpatient Hospital Stay (RURAL_HEALTH_CENTER): Payer: Self-pay | Admitting: Nurse Practitioner

## 2019-06-02 ENCOUNTER — Ambulatory Visit: Payer: Medicare Managed Care Other | Attending: Nurse Practitioner | Admitting: Nurse Practitioner

## 2019-06-02 ENCOUNTER — Encounter (RURAL_HEALTH_CENTER): Payer: Self-pay | Admitting: Nurse Practitioner

## 2019-06-02 DIAGNOSIS — Z23 Encounter for immunization: Secondary | ICD-10-CM

## 2019-06-02 DIAGNOSIS — Z2821 Immunization not carried out because of patient refusal: Secondary | ICD-10-CM

## 2019-06-02 DIAGNOSIS — M7541 Impingement syndrome of right shoulder: Secondary | ICD-10-CM

## 2019-06-02 DIAGNOSIS — T50901D Poisoning by unspecified drugs, medicaments and biological substances, accidental (unintentional), subsequent encounter: Secondary | ICD-10-CM

## 2019-06-02 MED ORDER — TIZANIDINE HCL 4 MG PO TABS
4.0000 mg | ORAL_TABLET | Freq: Three times a day (TID) | ORAL | 0 refills | Status: DC | PRN
Start: 2019-06-02 — End: 2019-12-30

## 2019-06-02 NOTE — Progress Notes (Signed)
Patient Name: [name],[name]    Subjective   History of Present Illness:   [name] is a 34 y.o. female who presents with:  Chief Complaint   Patient presents with    Drug Overdose     Hospital Folow-up     This visit was conducted with the use of interactive audio / video telecommunication that permitted real time communication between [employer], and myself. She consented to participation and received services through videoconferencing while I was located at [facility].    Hospital Follow-up:  She had went to [facility] ER for poly-subpharmacy overdose on 05/24/19. She was admitted to [facility] from 05/24/19 to 05/27/19. She says that she is unsure of what had happened. States she doesn't remember anything that happened just waking up in the hospital. She declines SI or HI.  She was seen by psychiatry in the hospital and was found that there was no need for in patient psychiatry treatment and followed up with them prior to this appointment today and started on Remeron.     Shoulder Impingement:  She also has a history of shoulder impingement of the right shoulder. She reports she follow-up with the orthopedic provider on 06/09/2019. Requested a muscle relaxer. Discussed she really needed to talk with her provider regarding treatment for her condition they are treating and the limitations we now have with her treatment. She verbalizes understanding.       Problem List:     Patient Active Problem List   Diagnosis    PCOS (polycystic ovarian syndrome)    Bipolar disorder    Chronic hepatitis C    Crohn's disease    Fibromyalgia    Grand mal status    Human papilloma virus infection    Insomnia    Encounter for long-term (current) use of high-risk medication    Bipolar 2 disorder, major depressive episode    GAD (generalized anxiety disorder)    Moderate episode of recurrent major depressive disorder    Fatigue    Gastroesophageal reflux  disease    Migraine    Seizure    Diffuse dysfunction of smooth muscle of gastrointestinal tract    Chronic abdominal pain    Chronic obstructive pulmonary disease    Overdose          Medications:     Prior to Admission medications    Medication Sig Start Date End Date Taking? Authorizing Provider   albuterol sulfate HFA (PROVENTIL) 108 (90 Base) MCG/ACT inhaler Inhale 2 puffs into the lungs every 4 (four) hours as needed for Wheezing or Shortness of Breath 12/13/18 12/13/19  Marja Kays T, NP   buprenorphine-naloxone (SUBOXONE) 8-2 MG Film Place 1 tablet under the tongue 2 (two) times daily    [provider]   clonazePAM (KlonoPIN) 0.5 MG tablet Take 1 tablet (0.5 mg total) by mouth daily as needed for Anxiety 05/23/19   Marja Kays T, NP   diclofenac sodium 3 % Gel Apply 1 application topically 4 (four) times daily as needed (shoulder pain) 05/23/19   Marja Kays T, NP   diclofenac sodium 3 % Gel Apply topically    [provider]   diphenoxylate-atropine (LOMOTIL) 2.5-0.025 MG per tablet Take 1 tablet by mouth 4 (four) times daily as needed for Diarrhea    [provider]   docusate sodium (COLACE) 100 MG capsule Take 1 capsule (100 mg total) by mouth 2 (two) times daily as needed for  Constipation 05/06/19   Theotis Burrow, NP   fluticasone-salmeterol (ADVAIR HFA) 230-21 MCG/ACT inhaler Inhale 2 puffs into the lungs 2 (two) times daily 12/13/18   Marja Kays T, NP   naloxone [facility]) 4 MG/0.1ML nasal spray 1 spray intranasally. If pt does not respond or relapses into respiratory depression call 911. Give additional doses every 2-3 min. 03/10/19   Arther Abbott, MD   ondansetron (Zofran ODT) 4 MG disintegrating tablet Take 1 tablet (4 mg total) by mouth every 8 (eight) hours as needed for Nausea 05/23/19   Marja Kays T, NP   propranolol (INDERAL) 20 MG tablet Take 20 mg by mouth 3 (three) times daily as needed    04/03/19   [provider]   topiramate  (TOPAMAX) 25 MG tablet Take 25 mg by mouth every 12 (twelve) hours       [provider]   traZODone (DESYREL) 300 MG tablet Take 300 mg by mouth nightly    [provider]   venlafaxine (EFFEXOR-XR) 150 MG 24 hr capsule Take 150 mg by mouth daily    [provider]        Review of Systems:   14 system review negative except as noted in the HPI.    Physical Exam:      No vitals due to telephonic communication.     General:  no acute distress. Pleasant and well groomed.  Neck: no JVD, no tracheal deviation  Lungs: Effort normal, no use accessory muscles.  Neuro:  alert and oriented.   Skin: no rashes or lesions noted  Psychiatric/Behavioral: appropriate affect, mood and behavior.       Assessment:     1. Accidental drug overdose, subsequent encounter    2. Impingement syndrome of right shoulder    3. Need for pneumococcal vaccination    4. Refused pneumococcal vaccination        Plan:   Patient Instructions   Accidental Overdose:  We will no longer be prescribing controlled substances going forward.   It is important that you follow-up with your suboxone provider/pain management for additional management.         Impingement Syndrome:  Keep your follow-up with orthopedics on 06/09/2019. I will give you a short supply of tizanidine 4 mg every 8 hours as needed for muscle spasms. If you feel that this medication is not helping please contact your orthopedic surgeon for additional recommendations.       Nonsurgical Treatment Options for Shoulder Impingement    Rest is key to healing your shoulder. If an activity hurts, dont do it. Otherwise, you may prevent healing and increase pain. Your shoulder needs active rest. This means avoiding overhead movements and activities that cause pain. But don't stop using your shoulder completely. This can cause it to stiffen or freeze. In addition to rest, impingement can be treated a number of ways. Your healthcare provider can help you find which of  these is best for you.   A physical therapist can also help you with exercises specific for your condition.  Listed below are several treatment options that may be considered:  Ice  Ice reduces inflammation and relieves pain. Apply an ice pack for about 15 minutes, 3 times a day. A pillow placed under your arm may help make you more comfortable. To make an ice pack, put ice cubes in a plastic bag that seals at the top. Wrap the bag in a clean, thin towel or cloth. Never  put ice or an ice pack directly on the skin.   Heat  Heat may soothe aching muscles, but it wont reduce inflammation. Use a heating pad or take a warm shower or bath. Do this for 15 minutes at a time.   Don't use heat when pain is constant. Heat is best when used for warming up before an activity. You can also switch between ice and heat.   Medicine  To relieve pain and inflammation, try over-the-counter pain relievers, such as acetaminophen or ibuprofen.Or, your healthcare provider may prescribe medicines. Ask how and when to take your medicine. Be sure to follow all instructions youre given.   Electrical stimulation  Electrical stimulation can help reduce pain and swelling. Your healthcare provider attaches small pads to your shoulder. A mild electric current then flows into your shoulder. You may feel tingling. Butyou should not feelpain.   Ultrasound  Ultrasound can help reduce pain. First a slick gel or medicated cream is applied to your shoulder. Then your healthcare provider places a small device over the area. The device uses sound waves to loosen shoulder tightness. This treatmentshould bepain-free.   Injection therapy  Injection therapy may be used to help diagnose your problem. It may also be used to reduce pain and inflammation.The injection typically includes 2 medicines. One is an anesthetic to numb the shoulder. The other isa steroid, such as cortisone,to help reduce painful swelling. It can take from a few hours to a couple  of days before the injection helps. Talk with yourhealthcare providerabout the possible risks and benefits of this therapy.   StayWell last reviewed this educational content on 05/23/2016   2000-2020 [employer], [place]. [address], [place], [place] [address]. All rights reserved. This information is not intended as a substitute for professional medical care. Always follow your healthcare professional's instructions.             Requested Prescriptions     Signed Prescriptions Disp Refills    tiZANidine (ZANAFLEX) 4 MG tablet 21 tablet 0     Sig: Take 1 tablet (4 mg total) by mouth every 8 (eight) hours as needed (muscle spasm)        Patient was counseled on possible medicine side effects which may include rash, swelling and/or stomach upset.  Patient was instructed to notify me or the ER if they experience problems.    Orders Placed This Encounter   Procedures    Pneumococcal polysaccharide vaccine 23-valent greater than or equal to 2yo subcutaneous/IM        Before leaving the office, the patient was informed of all ordered diagnostic tests and consults.     If you have not heard back from [place] about test results in 14 days, please call the office at [phone].    Signed by: Marja Kays, FNP-C    Supervising Doctor: Rogelio Seen, MD    The patient's electronic medical record was reviewed, any changes in the past medical history, past surgical history, medications, diagnostic tests were noted, and the record was updated accordingly.     Discussed option available for my chart which provides electronic access to diagnostic results.      By signing my name below, I, Elesa Massed, attest that this documentation has been prepared under the direction and in the presence of Carola Viramontes, FNP-C.  Electronically Signed: Elesa Massed, Scribe.     I, Marja Kays, FNP-C, personally performed the services described in this documentation. All medical record entries  made by the scribe were at my  direction and in my presence. I have reviewed the chart and plan instructions and agree that the record reflects my personal performance and is accurate and complete.

## 2019-06-02 NOTE — Patient Instructions (Signed)
Accidental Overdose:  We will no longer be prescribing controlled substances going forward.   It is important that you follow-up with your suboxone provider/pain management for additional management.         Impingement Syndrome:  Keep your follow-up with orthopedics on 06/09/2019. I will give you a short supply of tizanidine 4 mg every 8 hours as needed for muscle spasms. If you feel that this medication is not helping please contact your orthopedic surgeon for additional recommendations.       Nonsurgical Treatment Options for Shoulder Impingement    Rest is key to healing your shoulder. If an activity hurts, dont do it. Otherwise, you may prevent healing and increase pain. Your shoulder needs active rest. This means avoiding overhead movements and activities that cause pain. But don't stop using your shoulder completely. This can cause it to stiffen or freeze. In addition to rest, impingement can be treated a number of ways. Your healthcare provider can help you find which of these is best for you.   A physical therapist can also help you with exercises specific for your condition.  Listed below are several treatment options that may be considered:  Ice  Ice reduces inflammation and relieves pain. Apply an ice pack for about 15 minutes, 3 times a day. A pillow placed under your arm may help make you more comfortable. To make an ice pack, put ice cubes in a plastic bag that seals at the top. Wrap the bag in a clean, thin towel or cloth. Never put ice or an ice pack directly on the skin.   Heat  Heat may soothe aching muscles, but it wont reduce inflammation. Use a heating pad or take a warm shower or bath. Do this for 15 minutes at a time.   Don't use heat when pain is constant. Heat is best when used for warming up before an activity. You can also switch between ice and heat.   Medicine  To relieve pain and inflammation, try over-the-counter pain relievers, such as acetaminophen or ibuprofen.Or, your  healthcare provider may prescribe medicines. Ask how and when to take your medicine. Be sure to follow all instructions youre given.   Electrical stimulation  Electrical stimulation can help reduce pain and swelling. Your healthcare provider attaches small pads to your shoulder. A mild electric current then flows into your shoulder. You may feel tingling. Butyou should not feelpain.   Ultrasound  Ultrasound can help reduce pain. First a slick gel or medicated cream is applied to your shoulder. Then your healthcare provider places a small device over the area. The device uses sound waves to loosen shoulder tightness. This treatmentshould bepain-free.   Injection therapy  Injection therapy may be used to help diagnose your problem. It may also be used to reduce pain and inflammation.The injection typically includes 2 medicines. One is an anesthetic to numb the shoulder. The other isa steroid, such as cortisone,to help reduce painful swelling. It can take from a few hours to a couple of days before the injection helps. Talk with yourhealthcare providerabout the possible risks and benefits of this therapy.   StayWell last reviewed this educational content on 05/23/2016   2000-2020 [employer], [place]. [address], [place], [place] [address]. All rights reserved. This information is not intended as a substitute for professional medical care. Always follow your healthcare professional's instructions.

## 2019-07-07 ENCOUNTER — Ambulatory Visit: Payer: Medicare Managed Care Other | Attending: Nurse Practitioner | Admitting: Nurse Practitioner

## 2019-07-07 ENCOUNTER — Encounter (RURAL_HEALTH_CENTER): Payer: Self-pay | Admitting: Nurse Practitioner

## 2019-07-07 DIAGNOSIS — R110 Nausea: Secondary | ICD-10-CM

## 2019-07-07 MED ORDER — ONDANSETRON 4 MG PO TBDP
4.00 mg | ORAL_TABLET | Freq: Three times a day (TID) | ORAL | 5 refills | Status: DC | PRN
Start: 2019-07-07 — End: 2019-10-02

## 2019-07-07 NOTE — Patient Instructions (Addendum)
We make every attempt to respond to your calls and emails within 72 hours.  Please note that this is only during business hours.  If you have a question or a problem that needs attention sooner, and it is after hours please call the physician on-call at [phone], these services begin after 5PM on weekdays.  If you have an emergency, call 911.       Understanding Gastritis  Gastritis is a painful inflammation of the stomach lining. It has a number of causes. Gastritis and its symptoms can be eased with treatment. Work with your healthcare provider to find ways to treat your symptoms.     The stomach  To digest the food you eat, your stomach makes strong acids and enzymes. A healthy stomach has built-in defenses that protect its lining from damage by these acids and enzymes.   When you have gastritis  Acids may damage the stomach lining when the built-in defenses of the stomach dont work as they should. The stomach lining can then become inflamed. When this happens, it is called gastritis.   Causes of gastritis  Gastritis has many causes. They may include:    Aspirin and nonsteroidal anti-inflammatory drugs (NSAIDs)   Tobacco use   Alcohol use   Helicobacter pylori (H. pylori) bacteria   Trauma from injuries, burns, or major surgery   Cocaine use   Exposure to radiation   Critical illness or autoimmune disorders  Common symptoms  With gastritis, you may notice one or more of these:    A burning feeling in your upper belly   Pain that happens after eating certain foods   Gas or a bloated feeling in your stomach   Frequent belching   Nausea with or without vomiting   Loss of appetite   Feeling full quickly   Blood in vomit   Stools that look black and tarry   Paleness   Tiredness (fatigue)  StayWell last reviewed this educational content on 04/23/2017   2000-2020 [employer], [place]. [address] [place], [place] [address]. All rights reserved. This information is not intended as a  substitute for professional medical care. Always follow your healthcare professional's instructions.

## 2019-07-07 NOTE — Progress Notes (Signed)
Patient Name: [name],[name]    Subjective   History of Present Illness:   [name] is a 34 y.o. female who presents with:  Chief Complaint   Patient presents with    Nausea     This visit was conducted with the use of interactive audio / video telecommunication that permitted real time communication between [employer], and myself. She consented to participation and received services through videoconferencing while I was located at [facility].    Nausea:  [name] comes to the clinic today via telehealth today to discuss getting her medications refilled. She reports that she continues to have occasional nausea. She is currently prescribed Zofran ODT 4 MG Q8H. Denies vomiting. She is followed by gastroenterology and plans to have an EGD and colonoscopy on 08/19/2019.    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; No COLON present    Fibromyalgia     Gastroesophageal reflux disease     Insomnia     Seasonal allergic rhinitis        Past Surgical History:     Past Surgical History:   Procedure Laterality Date    APPENDECTOMY      CHOLECYSTECTOMY      Double Barrel Enterostomy  2013    for SBO    Enterostomy reversal  2014    double barrel small bowel ostomies reanastomosed    ILEOSTOMY  2011    for SBO after colectomy    ILEOSTOMY CLOSURE  2012    multiple times    LAPAROTOMY, COLECTOMY, TOTAL  2011    only a rectum remains    OVARIAN CYST SURGERY  2011-2016    several    SALPINGECTOMY, OPEN Right     2011       Family History:     Family History   Problem Relation Age of Onset    Hypertension  Mother     Diabetes Mother     Leukemia Father     Liver disease Maternal Grandfather     Hyperlipidemia Paternal Grandfather        Social History:     Social History     Tobacco Use   Smoking Status Current Every Day Smoker    Packs/day: 1.00    Years: 20.00    Pack years: 20.00    Types: Cigarettes   Smokeless Tobacco Never Used     Social History     Substance and Sexual Activity   Alcohol Use Never     Social History     Substance and Sexual Activity   Drug Use Not Currently    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 Authorizing Provider   albuterol sulfate HFA (PROVENTIL) 108 (90 Base) MCG/ACT inhaler Inhale 2 puffs into the lungs every 4 (four) hours as needed for Wheezing or Shortness of Breath 12/13/18 12/13/19 Yes Nikiya Starn T, NP   buprenorphine-naloxone (SUBOXONE) 8-2 MG Film Place 1 tablet under the tongue 2 (two) times daily   Yes [provider]   clonazePAM (KlonoPIN) 0.25 MG disintegrating tablet Take 0.25 mg by mouth as needed 06/14/19  Yes [provider]   diclofenac sodium 3 % Gel Apply 1 application topically 4 (four) times daily as needed (shoulder pain) 05/23/19  Yes Bryannah Boston T, NP   diphenoxylate-atropine (LOMOTIL) 2.5-0.025 MG per tablet Take 1 tablet by mouth 4 (four) times daily as needed for Diarrhea   Yes [provider]   docusate sodium (COLACE) 100 MG capsule Take 1 capsule (100 mg total) by mouth 2 (two) times daily as needed for Constipation 05/06/19  Yes Theotis Burrow, NP   fluticasone-salmeterol  (ADVAIR HFA) 230-21 MCG/ACT inhaler Inhale 2 puffs into the lungs 2 (two) times daily 12/13/18  Yes Michela Herst T, NP   mirtazapine (REMERON) 15 MG tablet Take 15 mg by mouth 06/02/19  Yes [provider]   naloxone (NARCAN) 4 MG/0.1ML nasal spray 1 spray intranasally. If pt does not respond or relapses into respiratory depression call 911. Give additional doses every 2-3 min. 03/10/19  Yes Arther Abbott, MD   ondansetron (Zofran ODT) 4 MG disintegrating tablet Take 1 tablet (4 mg total) by mouth every 8 (eight) hours as needed for Nausea 05/23/19  Yes Allysha Tryon T, NP   propranolol (INDERAL) 20 MG tablet Take 20 mg by mouth 3 (three) times daily as needed    04/03/19  Yes [provider]   tiZANidine (ZANAFLEX) 4 MG tablet Take 1 tablet (4 mg total) by mouth every 8 (eight) hours as needed (muscle spasm) 06/02/19  Yes Luva Metzger T, NP   topiramate (TOPAMAX) 25 MG tablet Take 25 mg by mouth every 12 (twelve) hours      Yes [provider]   traZODone (DESYREL) 300 MG tablet Take 300 mg by mouth nightly   Yes [provider]   venlafaxine (EFFEXOR-XR) 150 MG 24 hr capsule Take 150 mg by mouth daily   Yes [provider]   clonazePAM (KlonoPIN) 0.5 MG tablet Take 1 tablet (0.5 mg total) by mouth daily as needed for Anxiety 05/23/19 07/07/19  Marja Kays T, NP        Review of Systems:   14 system review negative except as noted in the HPI.    Physical Exam:      No vitals due to telephonic communication.     General:  no acute distress. Pleasant and well groomed.  Neck: no JVD, no tracheal deviation  Lungs: Effort normal, no use accessory muscles.  Neuro:  alert and oriented.   Skin: no rashes or lesions noted  Psychiatric/Behavioral: appropriate affect, mood and behavior.       Assessment:     1. Nausea        Plan:   Patient Instructions   We make every attempt to respond to your calls and emails within 72 hours.  Please note that this is only during business hours.   If you have a question or a problem that needs attention sooner, and it is after hours please call the physician on-call at [phone], these services begin after 5PM on weekdays.  If you have an emergency, call 911.       Understanding Gastritis  Gastritis is a painful inflammation of the stomach lining. It has a number of causes. Gastritis and its symptoms can be eased with treatment. Work with your healthcare provider to find ways to treat your symptoms.     The stomach  To digest the food you eat, your stomach makes strong acids and enzymes. A healthy stomach has built-in defenses that protect its lining from damage by these acids and enzymes.   When you have gastritis  Acids may damage the stomach lining when the built-in defenses of the stomach dont work as they should. The stomach lining can then become inflamed. When this happens, it is called gastritis.   Causes of gastritis  Gastritis has many causes. They may include:    Aspirin and nonsteroidal anti-inflammatory drugs (NSAIDs)   Tobacco use   Alcohol use   Helicobacter pylori (H. pylori) bacteria   Trauma from injuries, burns, or major surgery   Cocaine use   Exposure to radiation   Critical illness or autoimmune disorders  Common symptoms  With gastritis, you may notice one or more of these:    A burning feeling in your upper belly   Pain that happens after eating certain foods   Gas or a bloated feeling in your stomach   Frequent belching   Nausea with or without vomiting   Loss of appetite   Feeling full quickly   Blood in vomit   Stools that look black and tarry   Paleness   Tiredness (fatigue)  StayWell last reviewed this educational content on 04/23/2017   2000-2020 [employer], [place]. [address], [place], [place] [address]. All rights reserved. This information is not intended as a substitute for professional medical care. Always follow your healthcare professional's instructions.             Requested Prescriptions      Signed Prescriptions Disp Refills    ondansetron (Zofran ODT) 4 MG disintegrating tablet 30 tablet 5     Sig: Take 1 tablet (4 mg total) by mouth every 8 (eight) hours as needed for Nausea        Patient was counseled on possible medicine side effects which may include rash, swelling and/or stomach upset.  Patient was instructed to notify me or the ER if they experience problems.    No orders of the defined types were placed in this encounter.       Before leaving the office, the patient was informed of all ordered diagnostic tests and consults.     If you have not heard back from [place] about test results in 14 days, please call the office at [phone].    Signed by: Marja Kays, FNP-C    Supervising Doctor: Rogelio Seen, MD    The patient's electronic medical record was reviewed, any changes in the past medical history, past surgical history, medications, diagnostic tests were noted, and the record was updated accordingly.     Discussed option available for my chart which provides electronic access to diagnostic results.      By signing my name below, I, Elesa Massed, attest that this documentation has been prepared under  the direction and in the presence of Katurah Karapetian, FNP-C.  Electronically Signed: Elesa Massed, Scribe.     I, Marja Kays, FNP-C, personally performed the services described in this documentation. All medical record entries made by the scribe were at my direction and in my presence. I have reviewed the chart and plan instructions and agree that the record reflects my personal performance and is accurate and complete.

## 2019-07-28 ENCOUNTER — Ambulatory Visit: Payer: Medicare Managed Care Other | Attending: Nurse Practitioner | Admitting: Nurse Practitioner

## 2019-07-28 ENCOUNTER — Encounter (RURAL_HEALTH_CENTER): Payer: Self-pay | Admitting: Nurse Practitioner

## 2019-07-28 DIAGNOSIS — J069 Acute upper respiratory infection, unspecified: Secondary | ICD-10-CM

## 2019-07-28 MED ORDER — DOXYCYCLINE HYCLATE 100 MG PO CAPS
100.00 mg | ORAL_CAPSULE | Freq: Two times a day (BID) | ORAL | 0 refills | Status: AC
Start: 2019-07-28 — End: 2019-08-04

## 2019-07-28 NOTE — Progress Notes (Signed)
PROGRESS NOTE      Patient Name: [name],[name]  Primary Care Physician: Kathi Simpers, NP      History of Presenting Illness:   [name] is a 34 y.o. female who presents to the office with   Chief Complaint   Patient presents with    Sinusitis     Sinus Pain  Patient complains of congestion, facial pain and post nasal drip. Associated symptoms also include altered taste (food tasting bad). Onset of symptoms was 5 days ago. Symptoms have been gradually worsening since that time. Patient is current daily smoker. Has not tried anything for her symptoms. Sick contacts include her significant other.    This visit was conducted with the use of interactive audio/video telecommunications that permitted real-time communication between [employer] and myself. She consented to practice patient and received services at home, while I was located at [facility] [place] [facility]. [facility].      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; No COLON present    Fibromyalgia     Gastroesophageal reflux disease     Insomnia     Seasonal allergic rhinitis        Past Surgical History:     Past Surgical History:   Procedure Laterality Date    APPENDECTOMY      CHOLECYSTECTOMY      Double Barrel Enterostomy  2013    for SBO    Enterostomy reversal  2014    double barrel small bowel ostomies reanastomosed    ILEOSTOMY  2011    for SBO after colectomy    ILEOSTOMY CLOSURE  2012    multiple times    LAPAROTOMY, COLECTOMY, TOTAL  2011    only a rectum remains    OVARIAN CYST SURGERY  2011-2016    several    SALPINGECTOMY, OPEN Right     2011       Family  History:     Family History   Problem Relation Age of Onset    Hypertension Mother     Diabetes Mother     Leukemia Father     Liver disease Maternal Grandfather     Hyperlipidemia Paternal Grandfather        Social History:     Social History     Tobacco Use   Smoking Status Current Every Day Smoker    Packs/day: 1.00    Years: 20.00    Pack years: 20.00    Types: Cigarettes   Smokeless Tobacco Never Used     Social History     Substance and Sexual Activity   Alcohol Use Never     Social History     Substance and Sexual Activity   Drug Use Not Currently    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 Authorizing Provider   albuterol sulfate HFA (PROVENTIL) 108 (90 Base) MCG/ACT inhaler Inhale 2 puffs into the lungs every 4 (four) hours as needed for Wheezing or Shortness of Breath 12/13/18 12/13/19  Marja Kays T, NP   buprenorphine-naloxone (SUBOXONE) 8-2 MG Film Place 1 tablet under the tongue 2 (two) times daily    [provider]   clonazePAM (KlonoPIN) 0.25 MG disintegrating tablet Take 0.25 mg by mouth as needed 06/14/19   [provider]   diclofenac sodium 3 % Gel Apply 1 application topically 4 (four) times daily as needed (shoulder pain) 05/23/19   Marja Kays T, NP   diphenoxylate-atropine (LOMOTIL) 2.5-0.025 MG per tablet Take 1 tablet by mouth 4 (four) times daily as needed for Diarrhea    [provider]   docusate sodium (COLACE) 100 MG capsule Take 1 capsule (100 mg total) by mouth 2 (two) times daily as needed for Constipation  05/06/19   Theotis Burrow, NP   doxycycline (VIBRAMYCIN) 100 MG capsule Take 1 capsule (100 mg total) by mouth 2 (two) times daily for 7 days 07/28/19 08/04/19  Marja Kays T, NP   fluticasone-salmeterol (ADVAIR HFA) 230-21 MCG/ACT inhaler Inhale 2 puffs into the lungs 2 (two) times daily 12/13/18   Marja Kays T, NP   mirtazapine (REMERON) 15 MG tablet Take 15 mg by mouth 06/02/19   [provider]   naloxone (NARCAN) 4 MG/0.1ML nasal spray 1 spray intranasally. If pt does not respond or relapses into respiratory depression call 911. Give additional doses every 2-3 min. 03/10/19   Arther Abbott, MD   ondansetron (Zofran ODT) 4 MG disintegrating tablet Take 1 tablet (4 mg total) by mouth every 8 (eight) hours as needed for Nausea 07/07/19   Marja Kays T, NP   propranolol (INDERAL) 20 MG tablet Take 20 mg by mouth 3 (three) times daily as needed    04/03/19   [provider]   tiZANidine (ZANAFLEX) 4 MG tablet Take 1 tablet (4 mg total) by mouth every 8 (eight) hours as needed (muscle spasm) 06/02/19   Marja Kays T, NP   topiramate (TOPAMAX) 25 MG tablet Take 25 mg by mouth every 12 (twelve) hours       [provider]   traZODone (DESYREL) 300 MG tablet Take 300 mg by mouth nightly    [provider]   venlafaxine (EFFEXOR-XR) 150 MG 24 hr capsule Take 150 mg by mouth daily    [provider]       Review of Systems:      14 system review negative except as noted in the HPI.    Physical Exam:   No vitals due to telephonic communication.     General:  no acute distress. Pleasant and well groomed.  Neck: no JVD, no tracheal deviation  Lungs: Effort normal, no use accessory muscles.  Neuro:  alert and oriented.   Skin: no rashes or lesions noted  Psychiatric/Behavioral: appropriate affect, mood and behavior.      Assessment:     1. URI, acute  Plan:   Patient Instructions   Continue to rest as much as possible until you feel stronger. Dont let yourself get  overly tired when you go back to your activities.  Avoid cigarette smoke or other irritants to the lungs. Consider smoking cessation if you smoke as it lowers your ability to fight infection.  Continue to use Tylenol or Ibuprofen for pain/fever.  Drink plenty of fluids (water).  Elevate head of bed.  Continue all of your antibiotic medicine prescribed until it is all gone, even if you are feeling better.  Doxycycline 100 mg twice daily x 7 days.     OTC antihistamine (Zyrtec, Claritin, Allegra or their generics) as directed.  Do Nasal Saline Irrigation twice daily until symptoms resolve.    To perform nasal saline irrigation:   A.   Use city water or bottled drinking water     NOT WELL WATER OR DISTILLED WATER.   B.   Use WARM water.      Hot water will burn your nose and cold water will give you a bad "ice cream headache.   C.   Mix one container of water and one packet of salts    (salt packets come with neti pot or squeeze bottle and can be purchased separately).    If you do not have salt packets, you may make your own solution by mixing     1 cup water + 1/4 tsp salt + 1/4 tsp baking soda    Neti Pot:      Stand over sink with head tilted to the side.  The tip of the neti pot goes in one nostril and the liquid is poured in one nostril and comes out the other       Nostril.  Repeat on the other side.    Squeeze Bottle:   Stand over sink with head bent slightly forward, mouth open and throat relaxed.  The tip of the bottle goes in one nostril and the bottle is gently squeezed.   The liquid goes in one nostril and comes out the mouth.    Repeat on the other side.     Regardless of which technique you use -   It may make you cough or gag briefly and may burn or sting behind your eyes for a few moments.  This should pass rapidly.   If you swallow some of the liquid, it will NOT harm you.   If you are prescribed a nasal spray to use, use the spray AFTER the sinus rinse,  NOT before (as you will wash out the  medicine).    You do not need to do sinus irrigation if you are NOT having symptoms.  Do sinus irrigation once daily for mild symptoms.  Do sinus irrigation twice daily for moderate or severe symptoms.    Avoid acidic food and fluids, like orange juice or tomatoes.  Avoid milk while you have congestion.  Salt water gargle (1/2 teaspoon salt in 1 cup of warm water)or honey to soothe throat.  May use a humidifier.   Wash your hands frequently! This helps prevent the spread of germs.  Follow-up as needed.    Will consider COVID testing if no improvement.       Requested Prescriptions     Signed Prescriptions Disp Refills    doxycycline (VIBRAMYCIN) 100 MG capsule 14 capsule 0     Sig: Take 1 capsule (100 mg total) by mouth 2 (two) times daily for 7  days        Patient was counseled on possible medicine side effects which may include rash, swelling and/or stomach upset.  Patient was instructed to notify me or the ER if they experience problems.    No orders of the defined types were placed in this encounter.       Before leaving the office, the patient was informed of all ordered diagnostic tests and consults.     If you have not heard back from [place] about test results in 14 days, please call the office at [phone].    Signed by: Kathi Simpers, NP    Supervising Doctor: Rogelio Seen, MD    The patient's electronic medical record was reviewed, any changes in the past medical history, past surgical history, medications, diagnostic tests were noted, and the record was updated accordingly.     Discussed option available for my chart which provides electronic access to diagnostic results.

## 2019-07-28 NOTE — Patient Instructions (Addendum)
Continue to rest as much as possible until you feel stronger. Dont let yourself get overly tired when you go back to your activities.  Avoid cigarette smoke or other irritants to the lungs. Consider smoking cessation if you smoke as it lowers your ability to fight infection.  Continue to use Tylenol or Ibuprofen for pain/fever.  Drink plenty of fluids (water).  Elevate head of bed.  Continue all of your antibiotic medicine prescribed until it is all gone, even if you are feeling better.  Doxycycline 100 mg twice daily x 7 days.     OTC antihistamine (Zyrtec, Claritin, Allegra or their generics) as directed.  Do Nasal Saline Irrigation twice daily until symptoms resolve.    To perform nasal saline irrigation:   A.   Use city water or bottled drinking water     NOT WELL WATER OR DISTILLED WATER.   B.   Use WARM water.      Hot water will burn your nose and cold water will give you a bad "ice cream headache.   C.   Mix one container of water and one packet of salts    (salt packets come with neti pot or squeeze bottle and can be purchased separately).    If you do not have salt packets, you may make your own solution by mixing     1 cup water + 1/4 tsp salt + 1/4 tsp baking soda    Neti Pot:      Stand over sink with head tilted to the side.  The tip of the neti pot goes in one nostril and the liquid is poured in one nostril and comes out the other       Nostril.  Repeat on the other side.    Squeeze Bottle:   Stand over sink with head bent slightly forward, mouth open and throat relaxed.  The tip of the bottle goes in one nostril and the bottle is gently squeezed.   The liquid goes in one nostril and comes out the mouth.    Repeat on the other side.     Regardless of which technique you use -   It may make you cough or gag briefly and may burn or sting behind your eyes for a few moments.  This should pass rapidly.   If you swallow some of the liquid, it will NOT harm you.   If you are prescribed a nasal spray to use,  use the spray AFTER the sinus rinse,  NOT before (as you will wash out the medicine).    You do not need to do sinus irrigation if you are NOT having symptoms.  Do sinus irrigation once daily for mild symptoms.  Do sinus irrigation twice daily for moderate or severe symptoms.    Avoid acidic food and fluids, like orange juice or tomatoes.  Avoid milk while you have congestion.  Salt water gargle (1/2 teaspoon salt in 1 cup of warm water)or honey to soothe throat.  May use a humidifier.   Wash your hands frequently! This helps prevent the spread of germs.  Follow-up as needed.    Will consider COVID testing if no improvement.

## 2019-08-16 ENCOUNTER — Other Ambulatory Visit
Admission: RE | Admit: 2019-08-16 | Discharge: 2019-08-16 | Disposition: A | Discharge status: Home / Self Care | Payer: Medicare Managed Care Other | Source: Ambulatory Visit | Attending: Gastroenterology | Admitting: Gastroenterology

## 2019-08-16 LAB — VH APTIMA SARS-COV-2 ASSAY (PANTHER SYSTEM)(TM)
Aptima SARS-CoV-2: NEGATIVE
Does patient have symptoms related to condition of interest?: NEGATIVE
Does patient reside in a congregate care setting?: NEGATIVE
Is patient admitted to the intensive care unit?: NEGATIVE
Is patient employed in a healthcare setting?: NEGATIVE
Is the patient hospitalized because of this condition?: NEGATIVE
Is the patient pregnant?: NEGATIVE

## 2019-08-19 ENCOUNTER — Ambulatory Visit (HOSPITAL_BASED_OUTPATIENT_CLINIC_OR_DEPARTMENT_OTHER): Payer: Medicare Managed Care Other | Admitting: Anesthesiology

## 2019-08-19 ENCOUNTER — Encounter (HOSPITAL_BASED_OUTPATIENT_CLINIC_OR_DEPARTMENT_OTHER): Payer: Self-pay | Admitting: Gastroenterology

## 2019-08-19 ENCOUNTER — Encounter (HOSPITAL_BASED_OUTPATIENT_CLINIC_OR_DEPARTMENT_OTHER)
Admission: RE | Disposition: A | Discharge status: Home / Self Care | Payer: Self-pay | Source: Ambulatory Visit | Attending: Gastroenterology

## 2019-08-19 ENCOUNTER — Ambulatory Visit
Admission: RE | Admit: 2019-08-19 | Discharge: 2019-08-19 | Disposition: A | Discharge status: Home / Self Care | Payer: Medicare Managed Care Other | Source: Ambulatory Visit | Attending: Gastroenterology | Admitting: Gastroenterology

## 2019-08-19 DIAGNOSIS — R634 Abnormal weight loss: Secondary | ICD-10-CM | POA: Insufficient documentation

## 2019-08-19 DIAGNOSIS — T182XXA Foreign body in stomach, initial encounter: Secondary | ICD-10-CM | POA: Insufficient documentation

## 2019-08-19 DIAGNOSIS — K222 Esophageal obstruction: Secondary | ICD-10-CM | POA: Insufficient documentation

## 2019-08-19 DIAGNOSIS — Z980 Intestinal bypass and anastomosis status: Secondary | ICD-10-CM | POA: Insufficient documentation

## 2019-08-19 HISTORY — PX: EGD, COLONOSCOPY: SHX3799

## 2019-08-19 SURGERY — EGD, COLONOSCOPY
Anesthesia: Anesthesia MAC / Sedation | Site: Abdomen | Wound class: Clean Contaminated

## 2019-08-19 MED ORDER — FENTANYL CITRATE (PF) 50 MCG/ML IJ SOLN (WRAP)
INTRAMUSCULAR | Status: AC
Start: 2019-08-19 — End: ?
  Filled 2019-08-19: qty 2

## 2019-08-19 MED ORDER — ONDANSETRON HCL 4 MG/2ML IJ SOLN
4.0000 mg | Freq: Once | INTRAMUSCULAR | Status: DC | PRN
Start: 2019-08-19 — End: 2019-08-19

## 2019-08-19 MED ORDER — ALBUTEROL SULFATE (2.5 MG/3ML) 0.083% IN NEBU
2.5000 mg | INHALATION_SOLUTION | RESPIRATORY_TRACT | Status: DC | PRN
Start: 2019-08-19 — End: 2019-08-19

## 2019-08-19 MED ORDER — SODIUM CHLORIDE (PF) 0.9 % IJ SOLN
3.0000 mL | Freq: Three times a day (TID) | INTRAMUSCULAR | Status: DC
Start: 2019-08-19 — End: 2019-08-19

## 2019-08-19 MED ORDER — PROPOFOL 200 MG/20ML IV EMUL
INTRAVENOUS | Status: AC
Start: 2019-08-19 — End: ?
  Filled 2019-08-19: qty 40

## 2019-08-19 MED ORDER — PROPOFOL 200 MG/20ML IV EMUL
INTRAVENOUS | Status: DC | PRN
Start: 2019-08-19 — End: 2019-08-19
  Administered 2019-08-19: 20 mg via INTRAVENOUS
  Administered 2019-08-19 (×2): 30 mg via INTRAVENOUS
  Administered 2019-08-19: 80 mg via INTRAVENOUS
  Administered 2019-08-19: 10 mg via INTRAVENOUS
  Administered 2019-08-19: 30 mg via INTRAVENOUS
  Administered 2019-08-19: 50 mg via INTRAVENOUS
  Administered 2019-08-19: 20 mg via INTRAVENOUS
  Administered 2019-08-19: 80 mg via INTRAVENOUS
  Administered 2019-08-19: 20 mg via INTRAVENOUS

## 2019-08-19 MED ORDER — ONDANSETRON 4 MG PO TBDP
4.0000 mg | ORAL_TABLET | Freq: Once | ORAL | Status: DC | PRN
Start: 2019-08-19 — End: 2019-08-19

## 2019-08-19 MED ORDER — SODIUM CHLORIDE 0.9 % IV SOLN
INTRAVENOUS | Status: DC
Start: 2019-08-19 — End: 2019-08-19

## 2019-08-19 SURGICAL SUPPLY — 47 items
BRUSH CLEANING COMBINATION (Supply) ×2
BRUSH HEDGEHOG DUAL END (Supply) ×2 IMPLANT
CATH BALLOON DILATATION 5837 (Supply) IMPLANT
CATH GLD PROBE BICAP 7FX210CM (Supply) IMPLANT
CATH GLD PROBE BICAP 7FX300CM (Supply) IMPLANT
CATH GOLD PROBE 10FR (Supply) IMPLANT
CATH GOLD PROBE INJECTION 10F (Supply) IMPLANT
CLEANER ENZYMATIC BEDSIDE (Supply) ×4 IMPLANT
CLIP RESOLUTION 360 (Supply) IMPLANT
CONNECTOR QUICK PORT (Supply) ×4 IMPLANT
CRE BALLOON 10-12 DILATOR 5835 (Supply) IMPLANT
CRE BALLOON 12-15 DILATOR 5836 (Supply) IMPLANT
CRE BALLOON 18-20 DILATOR 5838 (Supply) ×2 IMPLANT
CRE BALLOON 6-8 DILAT 5833 (Supply) IMPLANT
CRE BALLOON 8-10 DILAT 5834 (Supply) IMPLANT
CRE ESOPH PYLORIC COL 10-12 (Supply) IMPLANT
CRE ESOPH PYLORIC COL 12-15 (Supply) IMPLANT
CRE ESOPH PYLORIC COL 15-18 (Supply) IMPLANT
CRE ESOPH PYLORIC COL 18-20 (Supply) IMPLANT
CRE ESOPH PYLORIC COL 6-8 (Supply) IMPLANT
CRE ESOPH PYLORIC COL 8-10 (Supply) IMPLANT
CRE PYLORIC COL 10-12 DIL 5847 (Supply) IMPLANT
CRE PYLORIC COL 12-15 DIL 5848 (Supply) IMPLANT
CRE PYLORIC COL 15-18 DIL 5849 (Supply) IMPLANT
CRE PYLORIC COL 8-10 DIL 5846 (Supply) IMPLANT
DEVICE STERIFLATE INFLATION (Supply) IMPLANT
DIL CRE 18-20 PYL CLN 5850 (Supply) IMPLANT
ENDOCUFF VISION LG GREEN 11.2 (Supply) IMPLANT
ENDOCUFF VISION PURPLE (Supply) IMPLANT
FORCEP BIOPSY HOT RADIAL JAW 4 (Supply) IMPLANT
FORCEP BIOPSY RAD JAW 1333-40 (Supply) IMPLANT
FORCEP RADIAL JAW JUMBO 240CM (Supply) IMPLANT
HEMOSTAT ENDO HEMOSPRAY 5G (Supply) IMPLANT
HEMOSTAT ENDO HEMOSPRAY 7FR (Supply)
MARKER ENDOSCOPIC SPOT (Supply) IMPLANT
NDL INTERJECT SCLERO 25G (Supply) IMPLANT
PAD CLINCH ENDO TRASPORT (Supply) ×4 IMPLANT
PROBE SIDE FIRE (Supply) IMPLANT
PROBE STRAIGHT FIRE APC (Supply) IMPLANT
SNARE ENDO CAP2 RND 10MM LOOP (Supply) IMPLANT
SNARE LARGE CAPTIV OVAL 6131 (Supply) IMPLANT
SNARE ROTATE SM OVAL MED STFF (Supply) IMPLANT
SNARE SMALL CAPTIV 6230 (Supply) IMPLANT
SPEEDBAND (Supply) IMPLANT
SYSTEM INFLATION ALLIANCE II (Supply) ×2 IMPLANT
VALVE DISP CLEANING BIOGUARD (Supply) ×2 IMPLANT
VALVE OLYMPUS DISP A/W/S/ BIO (Supply) ×4 IMPLANT

## 2019-08-19 NOTE — Discharge Instr - AVS First Page (Signed)
Endoscopy Discharge Instructions      COVID  Thank you for allowing us to provide you care during this evolving time. For questions regarding your test results we encourage you to call the office of the physician who performed your exam.     After you leave the hospital:  1. Due to the effects of the sedatives, you may feel tired for the remainder of today.   2. We recommend you go home after discharge  3. It is advisable to have supervision or access to seek help for a few hours after discharge  4. Do not drive, operate machinery, or sign important documents until tomorrow.  5. Please rest and drink extra fluids today.   6.  Avoid alcohol today.  7. Start with light easily tolerated foods advance to a regular diet as tolerated.  8. Resume your normal activities / work tomorrow    When to seek help -     Nausea / Vomiting: - try small amounts of bland foods like crackers or toast, & liquids such as soda, electrolyte replacement drinks or ice chips. Call for:  • New onset or ongoing nausea and vomiting   • The symptoms worsen - cold skin, confusion, blood or unexplainable black appearing vomit  Bleeding:  • Passing of blood or clots  or a change in stool consistency and or black in color  • Vomiting or spitting up blood  Infection:  • Redness or swelling at the IV site that continues to worsen. Initial warm compress may help.  • Fever  Pain / other:  • New onset of pain that does not subside over time or prevents you from normal activity or eating  • Shortness of breath or breathing trouble  • Weakness, feeling faint, passing out  • Swollen or distended abdomen    During business hours call your physician’s office.   After-hours call [facility] [phone] and a physician will be contacted

## 2019-08-19 NOTE — Transfer of Care (Signed)
Anesthesia Transfer of Care Note    Patient: [name]    Last vitals:   Vitals:    08/19/19 1443   BP: 108/72   Pulse: 78   Resp: 14   Temp:    SpO2: 100%       Oxygen: Nasal Cannula     Mental Status:awake    Airway: Natural    Cardiovascular Status:  stable

## 2019-08-19 NOTE — Anesthesia Postprocedure Evaluation (Signed)
Anesthesia Post Evaluation    Patient: [name]    Procedure(s):  EGD, COLONOSCOPY    Anesthesia type: MAC    Last Vitals:   Vitals Value Taken Time   BP 108/72 08/19/19 1443   Temp 36.5 C (97.7 F) 08/19/19 1343   Pulse 78 08/19/19 1443   Resp 14 08/19/19 1443   SpO2 100 % 08/19/19 1443                 Anesthesia Post Evaluation:     Patient Evaluated: bedside  Patient Participation: complete - patient participated  Level of Consciousness: awake  Pain Score: 0  Pain Management: adequate    Airway Patency: patent    Anesthetic complications: No      PONV Status: none    Cardiovascular status: acceptable  Respiratory status: acceptable and nasal cannula  Hydration status: acceptable        Signed by: Alma Downs Falesha Schommer, 08/19/2019 2:44 PM

## 2019-08-19 NOTE — Anesthesia Preprocedure Evaluation (Signed)
Anesthesia Evaluation    AIRWAY    Mallampati: I    TM distance: >3 FB  Neck ROM: full  Mouth Opening:full  Planned to use difficult airway equipment: No CARDIOVASCULAR    regular       DENTAL        (+) upper dentures and lower dentures   PULMONARY    clear to auscultation     OTHER FINDINGS                  Relevant Problems   PULMONARY   (+) Chronic obstructive pulmonary disease      NEURO/PSYCH   (+) Grand mal status   (+) Seizure      CARDIO   (+) Migraine      GI   (+) Gastroesophageal reflux disease      GU/RENAL   (+) Chronic hepatitis C               Anesthesia Plan    ASA 3     MAC                     intravenous induction   Detailed anesthesia plan: MAC        Post op pain management: per surgeon    informed consent obtained      pertinent labs reviewed             Signed by: Alma Downs Ram Haugan 08/19/19 2:04 PM

## 2019-08-20 ENCOUNTER — Encounter (HOSPITAL_BASED_OUTPATIENT_CLINIC_OR_DEPARTMENT_OTHER): Payer: Self-pay | Admitting: Gastroenterology

## 2019-09-05 ENCOUNTER — Other Ambulatory Visit (RURAL_HEALTH_CENTER): Payer: Self-pay | Admitting: Nurse Practitioner

## 2019-09-08 ENCOUNTER — Other Ambulatory Visit (RURAL_HEALTH_CENTER): Payer: Self-pay

## 2019-09-08 NOTE — Telephone Encounter (Signed)
Prescription expired, please send new rx.

## 2019-09-11 ENCOUNTER — Other Ambulatory Visit (RURAL_HEALTH_CENTER): Payer: Self-pay | Admitting: Nurse Practitioner

## 2019-09-11 ENCOUNTER — Ambulatory Visit: Payer: Medicare Managed Care Other | Attending: Nurse Practitioner | Admitting: Nurse Practitioner

## 2019-09-11 ENCOUNTER — Encounter (RURAL_HEALTH_CENTER): Payer: Self-pay | Admitting: Nurse Practitioner

## 2019-09-11 ENCOUNTER — Telehealth (RURAL_HEALTH_CENTER): Payer: Self-pay | Admitting: Nurse Practitioner

## 2019-09-11 VITALS — BP 112/60 | HR 98 | Temp 98.0°F | Resp 18 | Ht 66.0 in | Wt 140.0 lb

## 2019-09-11 DIAGNOSIS — K660 Peritoneal adhesions (postprocedural) (postinfection): Secondary | ICD-10-CM | POA: Insufficient documentation

## 2019-09-11 DIAGNOSIS — N83209 Unspecified ovarian cyst, unspecified side: Secondary | ICD-10-CM | POA: Insufficient documentation

## 2019-09-11 DIAGNOSIS — B850 Pediculosis due to Pediculus humanus capitis: Secondary | ICD-10-CM

## 2019-09-11 DIAGNOSIS — F17210 Nicotine dependence, cigarettes, uncomplicated: Secondary | ICD-10-CM

## 2019-09-11 DIAGNOSIS — M75111 Incomplete rotator cuff tear or rupture of right shoulder, not specified as traumatic: Secondary | ICD-10-CM

## 2019-09-11 MED ORDER — PERMETHRIN 1 % EX LOTN
TOPICAL_LOTION | Freq: Once | CUTANEOUS | 0 refills | Status: AC
Start: 2019-09-11 — End: 2019-09-11

## 2019-09-11 MED ORDER — NICOTINE 14 MG/24HR TD PT24
1.00 | MEDICATED_PATCH | TRANSDERMAL | 0 refills | Status: DC
Start: 2019-10-10 — End: 2019-10-02

## 2019-09-11 MED ORDER — NICOTINE 7 MG/24HR TD PT24
1.00 | MEDICATED_PATCH | Freq: Every day | TRANSDERMAL | 0 refills | Status: DC
Start: 2019-11-08 — End: 2019-09-11

## 2019-09-11 MED ORDER — NICOTINE 14 MG/24HR TD PT24
1.00 | MEDICATED_PATCH | TRANSDERMAL | 0 refills | Status: DC
Start: 2019-10-10 — End: 2019-09-11

## 2019-09-11 MED ORDER — NICOTINE 21 MG/24HR TD PT24
1.00 | MEDICATED_PATCH | TRANSDERMAL | 0 refills | Status: DC
Start: 2019-09-11 — End: 2019-10-02

## 2019-09-11 MED ORDER — NICOTINE 7 MG/24HR TD PT24
1.0000 | MEDICATED_PATCH | Freq: Every day | TRANSDERMAL | 0 refills | Status: DC
Start: 2019-11-08 — End: 2019-10-02

## 2019-09-11 MED ORDER — NICOTINE 21 MG/24HR TD PT24
1.00 | MEDICATED_PATCH | TRANSDERMAL | 0 refills | Status: DC
Start: 2019-09-11 — End: 2019-09-11

## 2019-09-11 NOTE — Telephone Encounter (Signed)
LMTCB CNR

## 2019-09-11 NOTE — Progress Notes (Signed)
PROGRESS NOTE      Patient Name: [name],[name]  Primary Care Physician: Kathi Simpers, NP      History of Presenting Illness:   [name] is a 34 y.o. female who presents to the office with   Chief Complaint   Patient presents with    Arm Swelling    Shoulder Pain     Rotator cuff tear:  Patient complains of right shoulder pain. The symptoms began several months ago. Aggravating factors: repetitive activity, raising arm above head. Pain is located diffusely throughout the shoulder. Discomfort is described as burning, Guled Gahan/stabbing and throbbing. Symptoms are exacerbated by repetitive movements, overhead movements and lying on the shoulder. Evaluation to date: plain films: normal, MRI: abnormal High-grade partial-thickness tearing of the distal supraspinatus superimposed upon tendinosis.  Mild tendinosis of the distal infra spinatus and distal super scapularis.  A small amount of fluid is in the subarachnoidsubdeltoid bursa is compatible with mild bursitis. and Ortho consult: She reports that she has a referral with orthopedics in September in [place].. Therapy to date includes: rest and avoidance of offending activity.    Smoking Cessation  She presents for discussion regarding smoking cessation.  She began smoking Several ago. She currently smokes 1 packs per day.  She has attempted to quit smoking in the past, most recently a few months ago. Best success quitting using None. Barriers to quitting include fear of failing again, friends smoke, spouse/partner smokes, under a lot of stress now and Enjoyment of smoking. She denies constant cough, dyspnea on exertion, shortness of breath and tightness in chest.  She would like to trial nicotine transdermal patches.    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     No mechanical SBO - atonic bowel with 6 hour SB transit time; No COLON present    Fibromyalgia     Gastroesophageal reflux disease     Insomnia     Seasonal allergic rhinitis        Past Surgical History:     Past Surgical History:   Procedure Laterality Date    APPENDECTOMY      CHOLECYSTECTOMY      Double Barrel Enterostomy  2013    for SBO    EGD, COLONOSCOPY N/A 08/19/2019    Procedure: EGD, COLONOSCOPY;  Surgeon: Gwenith Spitz, MD;  Location: [facility];  Service: Gastroenterology;  Laterality: N/A;    Enterostomy reversal  2014    double barrel small bowel ostomies reanastomosed    ILEOSTOMY  2011    for SBO after colectomy    ILEOSTOMY CLOSURE  2012    multiple times    LAPAROTOMY, COLECTOMY, TOTAL  2011    only a rectum remains    OVARIAN CYST SURGERY  2011-2016    several    SALPINGECTOMY, OPEN Right     2011       Family History:     Family History   Problem Relation Age of Onset    Hypertension Mother     Diabetes Mother     Leukemia Father     Liver disease  Maternal Grandfather     Hyperlipidemia Paternal Grandfather        Social History:     Social History     Tobacco Use   Smoking Status Current Every Day Smoker    Packs/day: 1.00    Years: 20.00    Pack years: 20.00    Types: Cigarettes   Smokeless Tobacco Never Used     Social History     Substance and Sexual Activity   Alcohol Use Never     Social History     Substance and Sexual Activity   Drug Use Not Currently    Types: Heroin    Comment: opiates -- 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 Authorizing Provider   albuterol sulfate HFA (PROVENTIL) 108 (90 Base) MCG/ACT inhaler Inhale 2 puffs into the lungs every 4 (four) hours as needed for Wheezing or Shortness of Breath 12/13/18 12/13/19 Yes Amori Colomb T, NP   buprenorphine-naloxone (SUBOXONE) 8-2 MG Film Place 1 tablet under the tongue 2 (two) times daily   Yes [provider]   clonazePAM (KlonoPIN) 0.25 MG disintegrating tablet Take 0.25 mg by mouth as needed 06/14/19  Yes [provider]   diclofenac sodium 3 % Gel Apply 1 application topically 4 (four) times daily as needed (shoulder pain) 05/23/19  Yes Raphael Fitzpatrick T, NP   diphenoxylate-atropine (LOMOTIL) 2.5-0.025 MG per tablet TAKE 2 TABLETS BY MOUTH 4 TIMES DAILY 09/07/19  Yes Lambert Mody, Soffia Doshier T, NP   docusate sodium (COLACE) 100 MG capsule Take 1 capsule (100 mg total) by mouth 2 (two) times daily as needed for Constipation 05/06/19  Yes Theotis Burrow, NP   fluticasone-salmeterol (ADVAIR HFA) 230-21 MCG/ACT inhaler Inhale 2 puffs into the lungs 2 (two) times daily 12/13/18  Yes Maddyson Keil T, NP   hydrOXYzine (ATARAX) 25 MG tablet Take 50 mg by mouth 07/18/19  Yes [provider]   mirtazapine (REMERON) 15 MG tablet Take 15 mg by mouth 06/02/19  Yes [provider]   naloxone (NARCAN) 4 MG/0.1ML nasal spray 1 spray intranasally. If pt does not respond or relapses into respiratory depression call 911. Give additional doses every 2-3 min. 03/10/19  Yes Arther Abbott, MD   ondansetron (Zofran ODT) 4 MG disintegrating tablet Take 1 tablet (4 mg total) by mouth every 8 (eight) hours as needed for Nausea 07/07/19  Yes  Whedbee T, NP   pregabalin (LYRICA) 150 MG capsule Take 150 mg by mouth 3 (three) times daily 07/09/19  Yes  [provider]   propranolol (INDERAL) 20 MG tablet Take 20 mg by mouth 3 (three) times daily as needed    04/03/19  Yes [provider]   tiZANidine (ZANAFLEX) 4 MG tablet Take 1 tablet (4 mg total)  by mouth every 8 (eight) hours as needed (muscle spasm) 06/02/19  Yes Nyjah Denio T, NP   topiramate (TOPAMAX) 25 MG tablet Take 25 mg by mouth every 12 (twelve) hours      Yes [provider]   traZODone (DESYREL) 300 MG tablet Take 300 mg by mouth nightly   Yes [provider]   venlafaxine (EFFEXOR-XR) 150 MG 24 hr capsule Take 150 mg by mouth daily   Yes [provider]   nicotine (NICODERM CQ) 14 MG/24HR Place 1 patch onto the skin every 24 hours for 28 days 10/10/19 11/07/19  Marja Kays T, NP   nicotine (NICODERM CQ) 21 MG/24HR Place 1 patch onto the skin every 24 hours for 28 days 09/11/19 10/09/19  Marja Kays T, NP   nicotine (NICODERM CQ) 7 MG/24HR Place 1 patch onto the skin daily for 14 days 11/08/19 11/22/19  Marja Kays T, NP   nicotine (NICODERM CQ) 14 MG/24HR Place 1 patch onto the skin every 24 hours for 28 days 10/10/19 09/11/19  Marja Kays T, NP   nicotine (NICODERM CQ) 21 MG/24HR Place 1 patch onto the skin every 24 hours for 28 days 09/11/19 09/11/19  Marja Kays T, NP   nicotine (NICODERM CQ) 7 MG/24HR Place 1 patch onto the skin daily for 14 days 11/08/19 09/11/19  Marja Kays T, NP       Review of Systems:      14 system review negative except as noted in the HPI.    Physical Exam:     Vitals:    09/11/19 0925   BP: 112/60   Pulse: 98   Resp: 18   Temp: 98 F (36.7 C)   SpO2: 97%     Body mass index is 22.6 kg/m.    General:  no acute distress. Pleasant and well groomed.  HEENT: Pupils equal and round, sclera anicteric, normocephalic, nontraumatic  Neck: supple, no lymphadenopathy, no thyromegaly, no JVD  Cardiovascular: regular rate and rhythm, no murmurs  Lungs: clear to auscultation bilaterally, without wheezing, rhonchi, or rales   Extremities: no edema  Musculoskeletal: Gait WNL. No swelling, redness of joints.  Right shoulder with limited range of motion due to pain.  Neuro:  alert and oriented.   Skin: no rashes or lesions noted.  Psychiatric/Behavioral: appropriate affect, mood and behavior.    Tobacco Dependence:  Social History     Tobacco Use   Smoking Status Current Every Day Smoker    Packs/day: 1.00    Years: 20.00    Pack years: 20.00    Types: Cigarettes   Smokeless Tobacco Never Used     Ready to quit: Yes  Counseling given: Yes      Smoking Cessation Counseling:  Date: September 11, 2019  Advice/Assessment: Patient strongly urged to quit tobacco use.   RELEVANCE - patient counseled on how quitting may be personally relevant to the following topics:  leading a longer and better quality of life, decreased risk of cardiopulmonary disease, money saved by quitting smoking  and improving the health of people surrounding the pt  RISKS -  patient counseled on the following risks: short term risks (including dyspnea, exacerbation of asthma, impotence, infertility), long term risks (including MI, CVA, COPD, lung cancer, oropharynx cancer, esophageal cancer, and long term disability), environmental risks (including increased risk of cancer in spouse, higher rates of smoking by children of tobacco users, and increased risk of asthma, middle ear disease and respiratory infections in children of  smokers)  RECOMMENDATIONS -  1-800-QUITNOW (http://kirby-bean.org/), 1-877-44U-QUIT (MechanicalArm.dk) and Nicotine patches prescribed today.  Total counseling time: 3 to 10 minutes       Assessment:     1. Nontraumatic incomplete tear of right rotator cuff    2. Cigarette nicotine dependence without complication        Plan:   Patient Instructions   Rotator cuff tear:  Keep your appointment with orthopedics in [place] in September.  Avoid overuse activities that can exacerbate your pain.  Work note provided today may return on Sunday,  09/14/2019.      How to Quit using Tobacco  Tobacco use is one of the hardest habits to break. About half of allpeople who have ever used tobacco have been able to quit. Most peoplewho still use tobacco want to quit. Here are some of the best ways to stop using tobacco.    [facility] offers a free smoking cessation assistance. Information can be viewed via website: PodSocket.fi. Call 267-608-9080.    Keep trying  It takes most tobacco users about eight tries before they can quit entirely. Its important not to give up.  Go cold [place]  Mostformer smokers quit cold [place] (all at once). Trying to cut back gradually doesn't seem to work as well, perhaps because it continues the smoking habit. Also, it is possible to inhale more while smoking fewer cigarettes. This results in the same amount of nicotine in your body!  Get support  Support programs can be a big help, especially for heavy smokers. These groups offer lectures, ways to change behavior, and peer support. Here are some ways to find a support program:   Free national quitline: 800-QUIT-NOW (334) 747-3802).   Hospital quit-smoking programs.   American Lung Association: 805-181-0842).   American Cancer Society 646 303 6690).  Support at home is important too. Nontobacco users can offer praise and encouragement.     Over-the-counter medicines  Nicotine replacement therapymay make quittingeasier. Certain aids, such as the nicotine patch, gum, and lozenges, are available without a prescription. Itis best to use these under a doctors care, though. The skin patch provides a steady supply of nicotine. Nicotine gum and lozenges givetemporary bursts of low levels of nicotine. Both methods reduce the craving for cigarettes. Warning: If youhave nausea, vomiting, dizziness, weakness, or a fast heartbeat, stop using these products and see your doctor.  Prescription medicines  After reviewingyour tobacco use patterns and prior  attempts to quit, your doctor may offer a prescription medicine such as bupropion, varenicline, a nicotine inhaler, or nasal spray. Each has advantages and side effects. Your doctor can review these with you.  Health benefits of quitting  The benefits of quitting start right away and keep improving the longer you go without using tobacco.These benefits occur at any age. So whether you are 17 or 70, quitting is a good decision. Some of the benefits include:   20 minutes: Blood pressure and pulse return to normal.   8 hours: Oxygen levels return to normal.   2 days: Ability to smell and taste begin to improve as damaged nerves regrow.   2 to 3 weeks: Circulation and lung function improve.   1 to 9 months: Coughing, congestion, and shortness of breath decrease; tiredness decreases.   1 year: Risk of heart attack decreases by half.   5 years: Risk of lung cancer decreases by half; risk of stroke becomes the same as a nonsmokers.  For more on how to quit smoking, try these online resources:  Smokefree.gov   "Clearing the Air" booklet from the [employer]: FaceUpdate.com.br.pdf  Date Last Reviewed: 05/20/2013   2000-2016 [employer] [address] [place], [place] [address]. All rights reserved. This information is not intended as a substitute for professional medical care. Always follow your healthcare professional's instructions.         Requested Prescriptions     Signed Prescriptions Disp Refills    nicotine (NICODERM CQ) 14 MG/24HR 28 patch 0     Sig: Place 1 patch onto the skin every 24 hours for 28 days    nicotine (NICODERM CQ) 21 MG/24HR 28 patch 0     Sig: Place 1 patch onto the skin every 24 hours for 28 days    nicotine (NICODERM CQ) 7 MG/24HR 14 patch 0     Sig: Place 1 patch onto the skin daily for 14 days        Patient was counseled on possible medicine side effects which may include rash, swelling and/or stomach  upset.  Patient was instructed to notify me or the ER if they experience problems.    No orders of the defined types were placed in this encounter.       Before leaving the office, the patient was informed of all ordered diagnostic tests and consults.     If you have not heard back from [place] about test results in 14 days, please call the office at [phone].    Signed by: Kathi Simpers, NP    Supervising Doctor: Rogelio Seen, MD    The patient's electronic medical record was reviewed, any changes in the past medical history, past surgical history, medications, diagnostic tests were noted, and the record was updated accordingly.     Discussed option available for my chart which provides electronic access to diagnostic results.

## 2019-09-11 NOTE — Patient Instructions (Signed)
Rotator cuff tear:  Keep your appointment with orthopedics in [place] in September.  Avoid overuse activities that can exacerbate your pain.  Work note provided today may return on Sunday, 09/14/2019.      How to Quit using Tobacco  Tobacco use is one of the hardest habits to break. About half of allpeople who have ever used tobacco have been able to quit. Most peoplewho still use tobacco want to quit. Here are some of the best ways to stop using tobacco.    [facility] offers a free smoking cessation assistance. Information can be viewed via website: PodSocket.fi. Call (786)286-8015.    Keep trying  It takes most tobacco users about eight tries before they can quit entirely. Its important not to give up.  Go cold [place]  Mostformer smokers quit cold [place] (all at once). Trying to cut back gradually doesn't seem to work as well, perhaps because it continues the smoking habit. Also, it is possible to inhale more while smoking fewer cigarettes. This results in the same amount of nicotine in your body!  Get support  Support programs can be a big help, especially for heavy smokers. These groups offer lectures, ways to change behavior, and peer support. Here are some ways to find a support program:   Free national quitline: 800-QUIT-NOW 847-285-2410).   Hospital quit-smoking programs.   American Lung Association: 971-356-0505).   American Cancer Society 269-018-2324).  Support at home is important too. Nontobacco users can offer praise and encouragement.     Over-the-counter medicines  Nicotine replacement therapymay make quittingeasier. Certain aids, such as the nicotine patch, gum, and lozenges, are available without a prescription. Itis best to use these under a doctors care, though. The skin patch provides a steady supply of nicotine. Nicotine gum and lozenges givetemporary bursts of low levels of nicotine. Both methods reduce the craving for cigarettes. Warning: If  youhave nausea, vomiting, dizziness, weakness, or a fast heartbeat, stop using these products and see your doctor.  Prescription medicines  After reviewingyour tobacco use patterns and prior attempts to quit, your doctor may offer a prescription medicine such as bupropion, varenicline, a nicotine inhaler, or nasal spray. Each has advantages and side effects. Your doctor can review these with you.  Health benefits of quitting  The benefits of quitting start right away and keep improving the longer you go without using tobacco.These benefits occur at any age. So whether you are 17 or 70, quitting is a good decision. Some of the benefits include:   20 minutes: Blood pressure and pulse return to normal.   8 hours: Oxygen levels return to normal.   2 days: Ability to smell and taste begin to improve as damaged nerves regrow.   2 to 3 weeks: Circulation and lung function improve.   1 to 9 months: Coughing, congestion, and shortness of breath decrease; tiredness decreases.   1 year: Risk of heart attack decreases by half.   5 years: Risk of lung cancer decreases by half; risk of stroke becomes the same as a nonsmokers.  For more on how to quit smoking, try these online resources:   Smokefree.gov   "Clearing the Air" booklet from the [employer]: FaceUpdate.com.br.pdf  Date Last Reviewed: 05/20/2013   2000-2016 [employer] [address] [place], [place] [address]. All rights reserved. This information is not intended as a substitute for professional medical care. Always follow your healthcare professional's instructions.

## 2019-09-11 NOTE — Telephone Encounter (Signed)
Prescription for permethrin sent into local pharmacy.    Marja Kays, FNP-C

## 2019-09-11 NOTE — Telephone Encounter (Signed)
Patient called and left message stating that she is having a lice outbreak at her home and wants to know if we can send in some medication.

## 2019-09-11 NOTE — Telephone Encounter (Signed)
Patient aware.

## 2019-09-13 ENCOUNTER — Emergency Department
Admission: EM | Admit: 2019-09-13 | Discharge: 2019-09-13 | Disposition: A | Discharge status: Home / Self Care | Payer: Medicare Managed Care Other | Attending: General Practice | Admitting: General Practice

## 2019-09-13 ENCOUNTER — Emergency Department: Payer: Medicare Managed Care Other

## 2019-09-13 DIAGNOSIS — Z20822 Contact with and (suspected) exposure to covid-19: Secondary | ICD-10-CM | POA: Insufficient documentation

## 2019-09-13 DIAGNOSIS — R531 Weakness: Secondary | ICD-10-CM | POA: Insufficient documentation

## 2019-09-13 DIAGNOSIS — N390 Urinary tract infection, site not specified: Secondary | ICD-10-CM

## 2019-09-13 DIAGNOSIS — B9620 Unspecified Escherichia coli [E. coli] as the cause of diseases classified elsewhere: Secondary | ICD-10-CM | POA: Insufficient documentation

## 2019-09-13 LAB — COMPREHENSIVE METABOLIC PANEL
ALT: 16 U/L (ref 0–55)
AST (SGOT): 17 U/L (ref 10–42)
Albumin/Globulin Ratio: 1.09 Ratio (ref 0.80–2.00)
Albumin: 3.6 gm/dL (ref 3.5–5.0)
Alkaline Phosphatase: 111 U/L (ref 40–145)
Anion Gap: 12 mMol/L (ref 7.0–18.0)
BUN / Creatinine Ratio: 9.5 Ratio — ABNORMAL LOW (ref 10.0–30.0)
BUN: 7 mg/dL (ref 7–22)
Bilirubin, Total: 0.3 mg/dL (ref 0.1–1.2)
CO2: 22 mMol/L (ref 20.0–30.0)
Calcium: 9.1 mg/dL (ref 8.5–10.5)
Chloride: 111 mMol/L — ABNORMAL HIGH (ref 98–110)
Creatinine: 0.74 mg/dL (ref 0.60–1.20)
EGFR: 107 mL/min/{1.73_m2} (ref 60–150)
Globulin: 3.3 gm/dL (ref 2.0–4.0)
Glucose: 114 mg/dL — ABNORMAL HIGH (ref 71–99)
Osmolality Calculated: 280 mOsm/kg (ref 275–300)
Potassium: 4 mMol/L (ref 3.5–5.3)
Protein, Total: 6.9 gm/dL (ref 6.0–8.3)
Sodium: 141 mMol/L (ref 136–147)

## 2019-09-13 LAB — CBC AND DIFFERENTIAL
Bands: 1 % (ref 0–10)
Basophils %: 1 % (ref 0.0–3.0)
Basophils Absolute: 0.1 10*3/uL (ref 0.0–0.3)
Eosinophils %: 2 % (ref 0.0–7.0)
Eosinophils Absolute: 0.2 10*3/uL (ref 0.0–0.8)
Hematocrit: 44.3 % (ref 36.0–48.0)
Hemoglobin: 14.1 gm/dL (ref 12.0–16.0)
Lymphocytes Absolute: 1.5 10*3/uL (ref 0.6–5.1)
Lymphocytes: 13 % — ABNORMAL LOW (ref 15.0–46.0)
MCH: 31 pg (ref 28–35)
MCHC: 32 gm/dL (ref 31–36)
MCV: 99 fL (ref 80–100)
MPV: 8.7 fL (ref 6.0–10.0)
Monocytes Absolute: 1 10*3/uL (ref 0.1–1.7)
Monocytes: 8 % (ref 3.0–15.0)
Neutrophils %: 75 % (ref 42.0–78.0)
Neutrophils Absolute: 9 10*3/uL — ABNORMAL HIGH (ref 1.7–8.6)
PLT CT: 266 10*3/uL (ref 130–440)
RBC: 4.48 10*6/uL (ref 3.80–5.00)
RDW: 12.3 % (ref 10.5–14.5)
WBC: 11.9 10*3/uL — ABNORMAL HIGH (ref 4.0–11.0)

## 2019-09-13 LAB — VH URINALYSIS WITH MICROSCOPIC AND CULTURE IF INDICATED
Bilirubin, UA: NEGATIVE mg/dL
Blood, UA: NEGATIVE mg/dL
Glucose, UA: NEGATIVE mg/dL
Ketones UA: NEGATIVE mg/dL
Leukocyte Esterase, UA: NEGATIVE Leu/uL
Nitrite, UA: POSITIVE — AB
Protein, UR: NEGATIVE mg/dL
Urine Specific Gravity: 1.025 (ref 1.001–1.040)
Urobilinogen, UA: 1 mg/dL — AB
pH, Urine: 6.5 pH (ref 5.0–8.0)

## 2019-09-13 LAB — VH URINE DRUG SCREEN
Propoxyphene: NEGATIVE
Urine Amphetamine: NEGATIVE
Urine Barbiturates: NEGATIVE
Urine Benzodiazepines: NEGATIVE
Urine Buprenorphine: POSITIVE — AB
Urine Cannabinoids: NEGATIVE
Urine Cocaine: NEGATIVE
Urine Methadone Screen: NEGATIVE
Urine Methamphetamine: NEGATIVE
Urine Opiates: NEGATIVE
Urine Oxycodone: NEGATIVE
Urine Phencyclidine: NEGATIVE
Urine Tricyclics: NEGATIVE

## 2019-09-13 LAB — VH XPERT XPRESS(C) SARS-COV-2 QUALITATIVE PCR
Does patient have symptoms related to condition of interest?: NEGATIVE
Does patient reside in a congregate care setting?: NEGATIVE
Is patient employed in a healthcare setting?: NEGATIVE
Is the patient hospitalized because of this condition?: NEGATIVE
SARS CoV-2 by PCR: NEGATIVE

## 2019-09-13 LAB — MAGNESIUM: Magnesium: 1.8 mg/dL (ref 1.6–2.6)

## 2019-09-13 MED ORDER — NITROFURANTOIN MONOHYD MACRO 100 MG PO CAPS
100.0000 mg | ORAL_CAPSULE | Freq: Two times a day (BID) | ORAL | 0 refills | Status: AC
Start: 2019-09-13 — End: 2019-09-23

## 2019-09-13 MED ORDER — SODIUM CHLORIDE 0.9 % IV BOLUS
1000.0000 mL | Freq: Once | INTRAVENOUS | Status: AC
Start: 2019-09-13 — End: 2019-09-13
  Administered 2019-09-13: 19:00:00 1000 mL via INTRAVENOUS

## 2019-09-13 NOTE — ED Notes (Signed)
Patient is being treated as possible PUI.  Her teenage daughter is in room with her because patient says she needs here there to help her remember what's been going on.  I told Ms. Sermeno that it is our policy that no one be in room with patient's we are concerned may have COVID.  Since she lives with patient, both have their masks on, and distance is maintained, I have allowed her to stay.

## 2019-09-13 NOTE — EDIE (Signed)
COLLECTIVE?NOTIFICATION?09/13/2019 18:00?[name], [name]?MRN: [id number]    [facility]'s patient encounter information:   JXB:?[id number]  Account [id number]  Billing Account [id number]      Criteria Met      Narx Scores Alert    Security and Safety  No recent Security Events currently on file    ED Care Guidelines  There are currently no ED Care Guidelines for this patient. Please check your facility's medical records system.    Flags      Negative COVID-19 Lab Result - [facility] - A specimen collected from this patient was negative for COVID-19 / Attributed By: [place] Department of Health / Attributed On: 08/17/2019       Prescription Monitoring Program  852??- Narcotic Use Score  822??- Sedative Use Score  000??- Stimulant Use Score  710??- Overdose Risk Score  - All Scores range from 000-999 with 75% of the population scoring < 200 and on 1% scoring above 650  - The last digit of the narcotic, sedative, and stimulant score indicates the number of active prescriptions of that type  - Higher Use scores correlate with increased prescribers, pharmacies, mg equiv, and overlapping prescriptions  - Higher Overdose Risk Scores correlate with increased risk of unintentional overdose death   Concerning or unexpectedly high scores should prompt a review of the PMP record; this does not constitute checking PMP for prescribing purposes.      E.D. Visit Count (12 mo.)  Facility Visits   [facility] 1   [facility] 6   [facility] 3   [facility] 3   Total 13   Note: Visits indicate total known visits.     Recent Emergency Department Visit Summary  Showing 10 most recent visits out of 13 in the past 12 months  Date Facility [facility] Type Diagnoses or Chief Complaint   Sep 13, 2019 [name] [place] [facility]      Short term memory loss, headache      May 24, 2019 [name] [place] [facility]      OverDose      Drug Overdose      Poisoning by unspecified drugs, medicaments and biological substances, undetermined, initial encounter      May 12, 2019 [name] [place] [facility]      SHOULDER INJURY      Shoulder Pain      Unspecified sprain of right shoulder joint, initial encounter      May 03, 2019 [facility] [place] [facility]      SBO      Abdominal Pain      Unspecified intestinal obstruction, unspecified as to partial versus complete obstruction      May 03, 2019 [name] [place] [facility]      vomiting, abdominal pain      Abdominal Pain      Emesis      Complete intestinal obstruction, unspecified as to cause      Vomiting of fecal matter      Mar 11, 2019 [facility] [place] [facility]      abd pain      Unspecified intestinal obstruction, unspecified as to partial versus complete obstruction      Mar 11, 2019 [name] [place] [facility]  Abdominal Pain      Unspecified intestinal obstruction, unspecified as to partial versus complete obstruction      Mar 08, 2019 [facility] [place] [place] [facility]      abd pain      Abdominal Pain      Unspecified intestinal obstruction, unspecified as to partial versus complete obstruction      Mar 07, 2019 [name] [place] [facility]      vomiting, diarrhea, stomach pain      Abdominal Pain      Unspecified intestinal obstruction, unspecified as to partial versus complete obstruction      Dec 05, 2018 [facility] [place] [facility]      possible bowel obstruction      Abdominal Pain      Unspecified intestinal obstruction, unspecified as to partial versus complete obstruction          Recent Inpatient Visit Summary  Date Facility [facility] Type Diagnoses or Chief Complaint   May 24, 2019 [facility] [place] [place] [facility]      Poisoning by unspecified drugs, medicaments and biological  substances, accidental (unintentional), initial encounter      Fibromyalgia      May 03, 2019 [facility] [place] [facility]      Unspecified intestinal obstruction, unspecified as to partial versus complete obstruction      Other chronic pain      Unspecified abdominal pain      Other specified functional intestinal disorders      Generalized anxiety disorder      Fibromyalgia      Bipolar disorder, unspecified      Mar 11, 2019 [facility] [place] [place] [facility]      Unspecified intestinal obstruction, unspecified as to partial versus complete obstruction      Mar 08, 2019 [facility] [place] [place] [facility]      Unspecified intestinal obstruction, unspecified as to partial versus complete obstruction      Dec 05, 2018 [facility] [place]  [facility]      Unspecified intestinal obstruction, unspecified as to partial versus complete obstruction          Care Team  Provider Specialty Phone Fax Service Dates   Adline Mango, MD Family Medicine [phone] Jan 24, 2019 - Current    Adline Mango [facility] [phone]  Jan 24, 2019 - Current    [facility]   Current      Collective Portal  This patient has registered at the [facility] Emergency Department   For more information visit: [email or web address]     PLEASE NOTE:     1.   Any care recommendations and other clinical information are provided as guidelines or for historical purposes only, and providers should exercise their own clinical judgment when providing care.    2.   You may only use this information for purposes of treatment, payment or health care operations activities, and subject to the limitations of applicable Collective Policies.    3.   You should consult directly with the organization that provided a care guideline or other clinical history with any questions about additional  information or accuracy or completeness of information provided.    ? 2021 [place] - [email or web address]

## 2019-09-13 NOTE — ED Notes (Signed)
Pt rang requesting blanket.  Warm blanket provided.  She asks if her test results are back yet.  I told her some of them are, but we're still waiting on some.    IV fluid continues.  She has had her arm bent so optimal flow has not been occurring. Reminded her to straighten her arm so fluid will flow well.  She tells me she forgot.

## 2019-09-13 NOTE — ED Notes (Addendum)
Security came to nurses station and told me this patient left thru the back doors of ER and then went thru waiting room and got in a car that was waiting for her.  Therefore she did not receive her discharge instructions, or her work note.

## 2019-09-13 NOTE — ED Notes (Signed)
Pt rang, asks for something to eat.  Provided with meal box and apple juice.  Provided her daughter with a ginger ale.

## 2019-09-13 NOTE — ED Provider Notes (Signed)
History     Chief Complaint   Patient presents with    Generalized Body Aches    Memory Loss     HPI   34 year old female came to emergency room complaining of generalized weakness, tiredness, memory loss.  Patient has had these symptoms for the past 2 weeks.  Patient states sometime she does not know what she is doing and where she has.  Also she becomes hot and cold.  Patient has long history of anxiety and depression, Crohn's disease, COPD, hepatitis C, bipolar disorder, fibromyalgia, insomnia, GERD, seasonal allergy, chronic abdominal pain.  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     No mechanical SBO - atonic bowel with 6 hour SB transit time; No COLON present    Fibromyalgia     Gastroesophageal reflux disease     Insomnia     Seasonal allergic rhinitis        Past Surgical History:   Procedure Laterality Date    APPENDECTOMY      CHOLECYSTECTOMY      Double Barrel Enterostomy  2013    for SBO    EGD, COLONOSCOPY N/A 08/19/2019    Procedure: EGD, COLONOSCOPY;  Surgeon: Gwenith Spitz, MD;  Location[facility];  Service: Gastroenterology;  Laterality: N/A;    Enterostomy reversal  2014    double barrel small bowel ostomies reanastomosed    ILEOSTOMY  2011    for SBO after colectomy    ILEOSTOMY CLOSURE  2012    multiple times    LAPAROTOMY, COLECTOMY, TOTAL  2011    only a rectum remains    OVARIAN CYST SURGERY  2011-2016    several    SALPINGECTOMY, OPEN Right     2011       Family History   Problem Relation Age of Onset    Hypertension Mother     Diabetes Mother     Leukemia Father     Liver disease Maternal Grandfather     Hyperlipidemia Paternal Grandfather        Social  Social History     Tobacco Use    Smoking  status: Current Every Day Smoker     Packs/day: 1.00     Years: 20.00     Pack years: 20.00     Types: Cigarettes    Smokeless tobacco: Never Used   Vaping Use    Vaping Use: Never used   Substance Use Topics    Alcohol use: Never    Drug use: Not Currently     Types: Heroin     Comment: opiates -- last use 2013       .     Allergies   Allergen Reactions    Fentanyl Anaphylaxis    Ketorolac Tromethamine Itching    Morphine Itching    Vancomycin Other (See Comments) and Edema     throat swelling and turns red     Latex Itching    Oxycodone Hives    Oxycodone-Acetaminophen Hives    Reglan [Metoclopramide] Hives    Rocephin [Ceftriaxone] Hives    Tramadol Itching and Nausea And Vomiting    Wal[place]  Nuts] Other (See Comments)     diverticulitis        Home Medications             albuterol sulfate HFA (PROVENTIL) 108 (90 Base) MCG/ACT inhaler     Inhale 2 puffs into the lungs every 4 (four) hours as needed for Wheezing or Shortness of Breath     buprenorphine-naloxone (SUBOXONE) 8-2 MG Film     Place 1 tablet under the tongue 2 (two) times daily     clonazePAM (KlonoPIN) 0.25 MG disintegrating tablet     Take 0.25 mg by mouth as needed     diclofenac sodium 3 % Gel     Apply 1 application topically 4 (four) times daily as needed (shoulder pain)     diphenoxylate-atropine (LOMOTIL) 2.5-0.025 MG per tablet     TAKE 2 TABLETS BY MOUTH 4 TIMES DAILY     docusate sodium (COLACE) 100 MG capsule     Take 1 capsule (100 mg total) by mouth 2 (two) times daily as needed for Constipation     fluticasone-salmeterol (ADVAIR HFA) 230-21 MCG/ACT inhaler     Inhale 2 puffs into the lungs 2 (two) times daily     hydrOXYzine (ATARAX) 25 MG tablet     Take 50 mg by mouth     mirtazapine (REMERON) 15 MG tablet     Take 15 mg by mouth     naloxone (NARCAN) 4 MG/0.1ML nasal spray     1 spray intranasally. If pt does not respond or relapses into respiratory depression call 911. Give additional doses every 2-3 min.      nicotine (NICODERM CQ) 14 MG/24HR     Place 1 patch onto the skin every 24 hours for 28 days     nicotine (NICODERM CQ) 21 MG/24HR     Place 1 patch onto the skin every 24 hours for 28 days     nicotine (NICODERM CQ) 7 MG/24HR     Place 1 patch onto the skin daily for 14 days     ondansetron (Zofran ODT) 4 MG disintegrating tablet     Take 1 tablet (4 mg total) by mouth every 8 (eight) hours as needed for Nausea     pregabalin (LYRICA) 150 MG capsule     Take 150 mg by mouth 3 (three) times daily     propranolol (INDERAL) 20 MG tablet     Take 20 mg by mouth 3 (three) times daily as needed        tiZANidine (ZANAFLEX) 4 MG tablet     Take 1 tablet (4 mg total) by mouth every 8 (eight) hours as needed (muscle spasm)     topiramate (TOPAMAX) 25 MG tablet     Take 25 mg by mouth every 12 (twelve) hours        traZODone (DESYREL) 300 MG tablet     Take 300 mg by mouth nightly     venlafaxine (EFFEXOR-XR) 150 MG 24 hr capsule     Take 150 mg by mouth daily           Review of Systems   Neurological: Positive for weakness.   Psychiatric/Behavioral: Positive for confusion and sleep disturbance.   All other systems reviewed and are negative.      Physical Exam    BP: (!) 129/96, Heart Rate: (!) 106, Temp: 98.8 F (37.1 C), Resp Rate: 14, SpO2: 97 %, Weight: 63.5 kg    Physical Exam  Vitals  and nursing note reviewed.   Constitutional:       General: She is not in acute distress.     Appearance: Normal appearance. She is normal weight. She is not ill-appearing, toxic-appearing or diaphoretic.   HENT:      Head: Normocephalic and atraumatic.      Right Ear: Tympanic membrane, ear canal and external ear normal.      Left Ear: Tympanic membrane, ear canal and external ear normal.      Nose: Nose normal. No congestion or rhinorrhea.      Mouth/Throat:      Mouth: Mucous membranes are moist.      Pharynx: Oropharynx is clear. No oropharyngeal exudate or posterior oropharyngeal erythema.   Neck:      Vascular: No carotid bruit.    Cardiovascular:      Rate and Rhythm: Normal rate and regular rhythm.      Pulses: Normal pulses.      Heart sounds: Normal heart sounds.   Pulmonary:      Effort: Pulmonary effort is normal. No respiratory distress.      Breath sounds: Normal breath sounds. No stridor. No wheezing, rhonchi or rales.   Chest:      Chest wall: No tenderness.   Abdominal:      General: Abdomen is flat. Bowel sounds are normal. There is no distension.      Palpations: Abdomen is soft.      Tenderness: There is no abdominal tenderness. There is no guarding.   Musculoskeletal:         General: No swelling, tenderness, deformity or signs of injury. Normal range of motion.      Cervical back: Normal range of motion and neck supple. No rigidity or tenderness.      Right lower leg: No edema.      Left lower leg: No edema.   Lymphadenopathy:      Cervical: No cervical adenopathy.   Skin:     General: Skin is warm.      Coloration: Skin is not jaundiced or pale.      Findings: No bruising, erythema, lesion or rash.   Neurological:      General: No focal deficit present.      Mental Status: She is alert and oriented to person, place, and time.      Cranial Nerves: No cranial nerve deficit.      Sensory: No sensory deficit.      Motor: No weakness.      Coordination: Coordination normal.      Gait: Gait normal.           MDM and ED Course     ED Medication Orders (From admission, onward)    Start Ordered     Status Ordering Provider    09/13/19 1837 09/13/19 1836  sodium chloride 0.9 % bolus 1,000 mL  Once in ED     Route: Intravenous  Ordered Dose: 1,000 mL     Last MAR action: Allyson Sabal         Results     Procedure Component Value Units Date/Time    Xpert Xpress(R) SARS-CoV-2 Qualitative PCR [[id number]] Collected: 09/13/19 1906    Specimen: Nasopharyngeal Swab Updated: 09/13/19 2015     SARS CoV-2 by PCR Negative     Does patient have symptoms related to condition of interest? N     Is the patient hospitalized because of this  condition? N  Is patient employed in a healthcare setting? N     Does patient reside in a congregate care setting? N     Is the patient pregnant? UNK    Narrative:      Specimen source - Nasopharyngeal Swab       Urine Drug Screen [[id number]]  (Abnormal) Collected: 09/13/19 1906    Specimen: Urine, Random Updated: 09/13/19 1936     Urine Cannabinoids Negative     Urine Phencyclidine Negative     Urine Cocaine Negative     Urine Methamphetamine Negative     Urine Opiates Negative     Urine Amphetamine Negative     Urine Benzodiazepines Negative     Urine Tricyclics Negative     Urine Methadone Screen Negative     Urine Barbiturates Negative     Urine Oxycodone Negative     Propoxyphene Negative     Urine Buprenorphine Positive    Urinalysis w Microscopic and Culture if Indicated [[id number]]  (Abnormal) Collected: 09/13/19 1906    Specimen: Urine, Random Updated: 09/13/19 1933     Color, UA Yellow     Clarity, UA Clear     Urine Specific Gravity 1.025     pH, Urine 6.5 pH      Protein, UR Negative mg/dL      Glucose, UA Negative mg/dL      Ketones UA Negative mg/dL      Bilirubin, UA Negative mg/dL      Blood, UA Negative mg/dL      Nitrite, UA Positive     Urobilinogen, UA 1.0 mg/dL      Leukocyte Esterase, UA Negative Leu/uL      UR Micro Performed     WBC, UA 5-10 /hpf      Bacteria, UA Many /hpf      Squam Epithel, UA 1-5 /lpf     Narrative:      A Urine Culture has been ordered based upon the Positive UA results.    Urine Culture [[id number]] Collected: 09/13/19 1906    Specimen: Urine, Random Updated: 09/13/19 1926    CBC and differential [[id number]]  (Abnormal) Collected: 09/13/19 1849    Specimen: Blood Updated: 09/13/19 1914     WBC 11.9 K/cmm      RBC 4.48 M/cmm      Hemoglobin 14.1 gm/dL      Hematocrit 62.9 %      MCV 99 fL      MCH 31 pg      MCHC 32 gm/dL      RDW 52.8 %      PLT CT 266 K/cmm      MPV 8.7 fL      Neutrophils % 75.0 %      Lymphocytes 13.0 %      Monocytes 8.0 %      Eosinophils % 2.0 %       Basophils % 1.0 %      Bands 1 %      Neutrophils Absolute 9.0 K/cmm      Lymphocytes Absolute 1.5 K/cmm      Monocytes Absolute 1.0 K/cmm      Eosinophils Absolute 0.2 K/cmm      Basophils Absolute 0.1 K/cmm      RBC Morphology Normocytic/Normochromic    Narrative:      Manual differential performed    Magnesium [[id number]] Collected: 09/13/19 1849    Specimen: Plasma Updated: 09/13/19 1912     Magnesium 1.8 mg/dL  Comprehensive metabolic panel [[id number]]  (Abnormal) Collected: 09/13/19 1849    Specimen: Plasma Updated: 09/13/19 1912     Sodium 141 mMol/L      Potassium 4.0 mMol/L      Chloride 111 mMol/L      CO2 22.0 mMol/L      Calcium 9.1 mg/dL      Glucose 621 mg/dL      Creatinine 3.08 mg/dL      BUN 7 mg/dL      Protein, Total 6.9 gm/dL      Albumin 3.6 gm/dL      Alkaline Phosphatase 111 U/L      ALT 16 U/L      AST (SGOT) 17 U/L      Bilirubin, Total 0.3 mg/dL      Albumin/Globulin Ratio 1.09 Ratio      Anion Gap 12.0 mMol/L      BUN / Creatinine Ratio 9.5 Ratio      EGFR 107 mL/min/1.43m2      Osmolality Calculated 280 mOsm/kg      Globulin 3.3 gm/dL         CT Head WO- (Rad read)    Result Date: 09/13/2019  Normal noncontrast head CT. ReadingStation:[email or web address]      MDM  34 year old female came to emergency room complaining of generalized weakness tiredness and memory loss.  Patient states for a long period of time sometimes she does not know where she is.  Also some time she has a feeling of being hot and a few minutes later she she becomes so called.  Patient was advised that memory loss could be due to but not limited to stroke, psychological problem, medication effect, drug.  Also generalized weakness could be due to but not limited to dehydration, viral disease, Covid, pneumonia, diabetes, anemia, vomiting and diarrhea.  Reviewed lab results with patient patient was advised that she has a mild urinary tract infection, we put her on Macrobid to take twice a day.  Patient also was advised to rest,  plenty of liquid, see her family physician for follow-up, and return to emergency room if needed.  Answered patient questions, she understood my explanation and advice.               Procedures    Clinical Impression & Disposition     Clinical Impression  Final diagnoses:   Urinary tract infection without hematuria, site unspecified   Weakness        ED Disposition     ED Disposition Condition Date/Time Comment    Discharge  Sat Sep 13, 2019  9:24 PM [name] discharge to home/self care.    Condition at disposition: Stable           Discharge Medication List as of 09/13/2019  9:26 PM      START taking these medications    Details   nitrofurantoin, macrocrystal-monohydrate, (MACROBID) 100 MG capsule Take 1 capsule (100 mg total) by mouth 2 (two) times daily for 10 days, Starting Sat 09/13/2019, Until Tue 09/23/2019, E-Rx                       Morgan Keinath, Jean Rosenthal, MD  09/14/19 [id number]

## 2019-09-13 NOTE — Discharge Instructions (Signed)
Understanding Urinary Tract Infections (UTIs)   Most UTIs are caused by bacteria, but they may also be caused by viruses or fungi. Bacteria from the bowel are the most common source of infection. The infection may start because of any of the following:   · Sexual activity.  During sex, bacteria can travel from the penis, vagina, or rectum into the urethra.   · Bacteria outside the rectum getting into the urethra.  Bacteria on the skin outside the rectum may travel into the urethra. This is more common in women since the rectum and urethra are closer to each other than in men. Wiping from front to back after using the toilet and keeping the area clean can help prevent germs from getting to the urethra.  · Blocked urine flow through the urinary tract. If urine sits too long, germs may start to grow out of control.  Parts of the urinary tract  The infection can occur in any part of the urinary tract.     · The kidneys. These organs collect and store urine.  · The ureters. These tubes carry urine from the kidneys to the bladder.  · The bladder. This holds urine until you are ready to let it out.  · The urethra. This tube carries urine from the bladder out of the body. It is shorter in women, so bacteria can move through it more easily. The urethra is longer in men, so a UTI is less likely to reach the bladder or kidneys in men.  [employer] last reviewed this educational content on 01/23/2018  © 2000-2021 [employer]. All rights reserved. This information is not intended as a substitute for professional medical care. Always follow your healthcare professional's instructions.

## 2019-09-13 NOTE — ED Notes (Signed)
Pt tells me her ride is waiting for her.  I apologized for delay and explained that the doctor was pulled into an emergent patient.  She tells me she has some antibiotics at home she can use.  I told her that she needs full course. She says she has the full course because she was prescribed them for a bird bite and never took them.  She says she doesn't like to take medications if she doesn't need them, and she decided she didn't need them for a bird bite.

## 2019-09-17 LAB — VH CULTURE, URINE

## 2019-09-17 NOTE — Progress Notes (Signed)
Pt is on macrobid, UTI is susceptible to this, no abx change needed.

## 2019-09-24 ENCOUNTER — Telehealth (RURAL_HEALTH_CENTER): Payer: Self-pay | Admitting: Family

## 2019-09-24 ENCOUNTER — Emergency Department
Admission: EM | Admit: 2019-09-24 | Discharge: 2019-09-24 | Disposition: A | Discharge status: Home / Self Care | Payer: Medicare Managed Care Other | Attending: Internal Medicine | Admitting: Internal Medicine

## 2019-09-24 DIAGNOSIS — J069 Acute upper respiratory infection, unspecified: Secondary | ICD-10-CM | POA: Insufficient documentation

## 2019-09-24 DIAGNOSIS — M791 Myalgia, unspecified site: Secondary | ICD-10-CM | POA: Insufficient documentation

## 2019-09-24 DIAGNOSIS — R05 Cough: Secondary | ICD-10-CM | POA: Insufficient documentation

## 2019-09-24 DIAGNOSIS — R509 Fever, unspecified: Secondary | ICD-10-CM | POA: Insufficient documentation

## 2019-09-24 DIAGNOSIS — B349 Viral infection, unspecified: Secondary | ICD-10-CM | POA: Insufficient documentation

## 2019-09-24 DIAGNOSIS — Z20822 Contact with and (suspected) exposure to covid-19: Secondary | ICD-10-CM | POA: Insufficient documentation

## 2019-09-24 LAB — VH XPERT XPRESS(C) SARS-COV-2 QUALITATIVE PCR - HLAB
Does patient have symptoms related to condition of interest?: NEGATIVE
Does patient reside in a congregate care setting?: NEGATIVE
Is patient employed in a healthcare setting?: NEGATIVE
Is the patient hospitalized because of this condition?: NEGATIVE
SARS CoV-2 by PCR: NEGATIVE

## 2019-09-24 MED ORDER — ONDANSETRON 4 MG PO TBDP
4.0000 mg | ORAL_TABLET | Freq: Three times a day (TID) | ORAL | 0 refills | Status: DC | PRN
Start: 2019-09-24 — End: 2019-11-10

## 2019-09-24 MED ORDER — PREDNISONE 20 MG PO TABS
60.0000 mg | ORAL_TABLET | Freq: Every day | ORAL | 0 refills | Status: AC
Start: 2019-09-24 — End: 2019-09-29

## 2019-09-24 MED ORDER — BENZONATATE 100 MG PO CAPS
100.0000 mg | ORAL_CAPSULE | Freq: Three times a day (TID) | ORAL | 0 refills | Status: DC | PRN
Start: 2019-09-24 — End: 2019-11-10

## 2019-09-24 MED ORDER — ALBUTEROL SULFATE HFA 108 (90 BASE) MCG/ACT IN AERS
1.0000 | INHALATION_SPRAY | RESPIRATORY_TRACT | 0 refills | Status: DC | PRN
Start: 2019-09-24 — End: 2019-11-19

## 2019-09-24 NOTE — Discharge Instructions (Signed)
Viral Syndrome (Adult)  A viral illness may cause a number of symptoms such as fever. Other symptoms depend on the part of the body that the virus affects. If it settles in your nose, throat, and lungs, it may cause cough, sore throat, congestion, runny nose, headache, earache and other ear symptoms, or shortness of breath. If it settles in your stomach and intestinal tract, it may cause nausea, vomiting, cramping, and diarrhea. Sometimes it causes generalized symptoms like "aching all over," feeling tired, loss of energy, or loss of appetite.  A viral illness usually lasts anywhere from several days to several weeks, but sometimes it lasts longer. In some cases, a more serious infection can look like a viral syndrome in the first few days of the illness. You may need another exam and additional tests to know the difference. Watch for the warning signs listed below for when to seek medical advice.  Home care  Follow these guidelines for taking care of yourself at home:   If symptoms are severe, rest at home for the first 2 to 3 days.   Stay away from cigarette smoke - both your smoke and the smoke from others.   You may use over-the-counteracetaminophen or ibuprofen for fever, muscle aching, and headache, unless another medicine was prescribed for this. If you have chronic liver or kidney disease or ever had a stomach ulcer or gastrointestinal bleeding, talk with your healthcare provider before using these medicines. No one who is younger than 18 and ill with a fever should take aspirin. It may cause severe disease or death.   Your appetite may be poor, so a light diet is fine. Avoid dehydration by drinking 8 to 12, 8-ounce glasses of fluids each day. This may include water; orange juice; lemonade; apple, grape, and cranberry juice; clear fruit drinks; electrolyte replacement and sports drinks; and decaffeinated teas and coffee. If you have been diagnosed with a kidney disease, ask your healthcare provider  how much and what types of fluids you should drink to prevent dehydration. If you have kidney disease, drinking too much fluid can cause it build up in the your body and be dangerous to your health.   Over-the-counter remedies won't shorten the length of the illness but may be helpful for symptoms such as cough, sore throat, nasal and sinus congestion, or diarrhea. Don't use decongestants if you have high blood pressure.  Follow-up care  Follow up with your healthcare provider if you do not improve over the next week.  Call 911  Call 911 if any of the following occur:   Convulsion   Feeling weak, dizzy, or like you are going to faint   Chest pain, or more than mild shortness of breath  When to seek medical advice  Call your healthcare provider right away if any of these occur:   Cough with lots of colored sputum (mucus) or blood in your sputum   Chest pain, shortness of breath, wheezing, or trouble breathing   Severe headache; face, neck, or ear pain   Severe, constant pain in the lower right side of your belly (abdominal)   Continued vomiting (can't keep liquids down)   Frequent diarrhea (more than 5 times a day); blood (red or black color) or mucus in diarrhea   Feeling weak, dizzy, or like you are going to faint   Extreme thirst   Fever of 100.4F (38C) or higher, or as directed by your healthcare provider  [employer] last reviewed this educational content on 04/23/2016     2000-2021 [employer]. All rights reserved. This information is not intended as a substitute for professional medical care. Always follow your healthcare professional's instructions.

## 2019-09-24 NOTE — Telephone Encounter (Signed)
Patient called to make appointment, patient complains were cough, chest congestion but her hands were turning gray. Spoke to CR/JD patient was advised to go to the ER

## 2019-09-24 NOTE — ED Provider Notes (Signed)
History     Chief Complaint   Patient presents with    URI     HPI   The patient is a very pleasant patient who presents to the ER c/o COVID symptoms w/ fevers, chills, myalgias, cough, nausea, intermittent nonbloody nonbilious nonprojectile emesis and diarrhea w/o blood or mucus in it. The patient is still eating/drinking/swallowing normally and staying well hydrated. No CP or SOB or pleurisy. No numbness, tingling or unilateral weakness anywhere. No syncope, presyncope, lightheadedness or dizziness. No other c/o. Pt is otherwise in the pt's usual state of health.  The patient confirms that she is not pregnant.    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     No mechanical SBO - atonic bowel with 6 hour SB transit time; No COLON present    Fibromyalgia     Gastroesophageal reflux disease     Insomnia     Seasonal allergic rhinitis        Past Surgical History:   Procedure Laterality Date    APPENDECTOMY      CHOLECYSTECTOMY      Double Barrel Enterostomy  2013    for SBO    EGD, COLONOSCOPY N/A 08/19/2019    Procedure: EGD, COLONOSCOPY;  Surgeon: Gwenith Spitz, MD;  Location: [facility];  Service: Gastroenterology;  Laterality: N/A;    Enterostomy reversal  2014    double barrel small bowel ostomies reanastomosed    ILEOSTOMY  2011    for SBO after colectomy    ILEOSTOMY CLOSURE  2012    multiple times    LAPAROTOMY, COLECTOMY, TOTAL  2011    only a rectum remains    OVARIAN CYST SURGERY  2011-2016    several    SALPINGECTOMY, OPEN Right     2011       Family History   Problem Relation Age of Onset    Hypertension Mother     Diabetes Mother     Leukemia Father     Liver disease Maternal Grandfather     Hyperlipidemia Paternal  Grandfather        Social  Social History     Tobacco Use    Smoking status: Current Every Day Smoker     Packs/day: 1.00     Years: 20.00     Pack years: 20.00     Types: Cigarettes    Smokeless tobacco: Never Used   Vaping Use    Vaping Use: Never used   Substance Use Topics    Alcohol use: Never    Drug use: Not Currently     Types: Heroin     Comment: opiates -- last use 2013       .     Allergies   Allergen Reactions    Fentanyl Anaphylaxis    Ketorolac Tromethamine Itching    Morphine Itching    Vancomycin Other (See Comments) and Edema     throat swelling and turns red     Latex Itching    Oxycodone Hives    Oxycodone-Acetaminophen Hives    Reglan [Metoclopramide] Hives    Rocephin [Ceftriaxone] Hives    Tramadol Itching  and Nausea And Vomiting    Walnuts [place] Nuts] Other (See Comments)     diverticulitis        Home Medications     Med List Status: Complete Set By: Osa Craver, RN at 09/24/2019  9:37 AM                albuterol sulfate HFA (PROVENTIL) 108 (90 Base) MCG/ACT inhaler     Inhale 2 puffs into the lungs every 4 (four) hours as needed for Wheezing or Shortness of Breath     buprenorphine-naloxone (SUBOXONE) 8-2 MG Film     Place 1 tablet under the tongue 2 (two) times daily     clonazePAM (KlonoPIN) 0.25 MG disintegrating tablet     Take 0.25 mg by mouth as needed     diclofenac sodium 3 % Gel     Apply 1 application topically 4 (four) times daily as needed (shoulder pain)     diphenoxylate-atropine (LOMOTIL) 2.5-0.025 MG per tablet     TAKE 2 TABLETS BY MOUTH 4 TIMES DAILY     docusate sodium (COLACE) 100 MG capsule     Take 1 capsule (100 mg total) by mouth 2 (two) times daily as needed for Constipation     fluticasone-salmeterol (ADVAIR HFA) 230-21 MCG/ACT inhaler     Inhale 2 puffs into the lungs 2 (two) times daily     hydrOXYzine (ATARAX) 25 MG tablet     Take 50 mg by mouth     mirtazapine (REMERON) 15 MG tablet     Take 15 mg by mouth     naloxone (NARCAN) 4  MG/0.1ML nasal spray     1 spray intranasally. If pt does not respond or relapses into respiratory depression call 911. Give additional doses every 2-3 min.     nicotine (NICODERM CQ) 14 MG/24HR     Place 1 patch onto the skin every 24 hours for 28 days     nicotine (NICODERM CQ) 21 MG/24HR     Place 1 patch onto the skin every 24 hours for 28 days     nicotine (NICODERM CQ) 7 MG/24HR     Place 1 patch onto the skin daily for 14 days     nitrofurantoin, macrocrystal-monohydrate, (MACROBID) 100 MG capsule (Expired)     Take 1 capsule (100 mg total) by mouth 2 (two) times daily for 10 days     ondansetron (Zofran ODT) 4 MG disintegrating tablet     Take 1 tablet (4 mg total) by mouth every 8 (eight) hours as needed for Nausea     pregabalin (LYRICA) 150 MG capsule     Take 150 mg by mouth 3 (three) times daily     propranolol (INDERAL) 20 MG tablet     Take 20 mg by mouth 3 (three) times daily as needed        tiZANidine (ZANAFLEX) 4 MG tablet     Take 1 tablet (4 mg total) by mouth every 8 (eight) hours as needed (muscle spasm)     topiramate (TOPAMAX) 25 MG tablet     Take 25 mg by mouth every 12 (twelve) hours        traZODone (DESYREL) 300 MG tablet     Take 300 mg by mouth nightly     venlafaxine (EFFEXOR-XR) 150 MG 24 hr capsule     Take 150 mg by mouth daily           Review of Systems  Constitutional: Positive for chills, fatigue and fever.   HENT: Negative for trouble swallowing and voice change.    Eyes: Negative for photophobia and visual disturbance.   Respiratory: Negative.  Negative for apnea, cough, choking, chest tightness, shortness of breath, wheezing and stridor.    Cardiovascular: Negative.  Negative for chest pain, palpitations and leg swelling.   Gastrointestinal: Negative for abdominal pain.   Endocrine: Negative for polyphagia and polyuria.   Genitourinary: Negative.    Musculoskeletal: Positive for myalgias. Negative for neck pain and neck stiffness.   Skin: Negative for pallor.    Allergic/Immunologic: Negative for immunocompromised state.   Neurological: Negative for dizziness, tremors, seizures, syncope, facial asymmetry, speech difficulty, weakness, light-headedness and numbness.   Hematological: Negative for adenopathy.   Psychiatric/Behavioral: Negative for suicidal ideas.       Physical Exam    BP: 109/65, Heart Rate: 88, Temp: 97.6 F (36.4 C), Resp Rate: 18, SpO2: 95 %, Weight: 64.4 kg    Physical Exam  Vitals and nursing note reviewed.   Constitutional:       General: She is not in acute distress.     Appearance: Normal appearance. She is normal weight. She is not ill-appearing, toxic-appearing or diaphoretic.   HENT:      Head: Normocephalic and atraumatic.      Right Ear: Tympanic membrane, ear canal and external ear normal.      Left Ear: Tympanic membrane, ear canal and external ear normal.      Nose: Congestion and rhinorrhea present.      Mouth/Throat:      Mouth: Mucous membranes are moist.      Pharynx: Oropharynx is clear.   Eyes:      General: No scleral icterus.     Extraocular Movements: Extraocular movements intact.      Conjunctiva/sclera: Conjunctivae normal.      Pupils: Pupils are equal, round, and reactive to light.   Neck:      Vascular: No carotid bruit.   Cardiovascular:      Rate and Rhythm: Normal rate and regular rhythm.      Pulses: Normal pulses.      Heart sounds: Normal heart sounds. No gallop.    Pulmonary:      Effort: Pulmonary effort is normal. No respiratory distress.      Breath sounds: Normal breath sounds. No stridor. No wheezing, rhonchi or rales.   Chest:      Chest wall: No tenderness.   Abdominal:      General: Abdomen is flat. Bowel sounds are normal. There is no distension.      Palpations: Abdomen is soft. There is no mass.      Tenderness: There is no abdominal tenderness. There is no right CVA tenderness, left CVA tenderness, guarding or rebound.   Musculoskeletal:         General: No swelling, tenderness, deformity or signs of injury.  Normal range of motion.      Cervical back: Normal range of motion and neck supple. No rigidity or tenderness.      Right lower leg: No edema.      Left lower leg: No edema.   Lymphadenopathy:      Cervical: No cervical adenopathy.   Skin:     General: Skin is warm and dry.      Capillary Refill: Capillary refill takes less than 2 seconds.      Coloration: Skin is not jaundiced or pale.   Neurological:  General: No focal deficit present.      Mental Status: She is alert and oriented to person, place, and time. Mental status is at baseline.      Cranial Nerves: No cranial nerve deficit.      Sensory: No sensory deficit.      Motor: No weakness.      Coordination: Coordination normal.   Psychiatric:         Mood and Affect: Mood normal.         Behavior: Behavior normal.         Thought Content: Thought content normal.         Judgment: Judgment normal.           MDM and ED Course     ED Medication Orders (From admission, onward)    None             MDM    The patient is now resting comfortably, is alert and in no distress. The patient has a normal mental status and is neurologically intact. The patient appears well and is able to tolerate food or fluid by mouth, and there is no significant dehydration. There is no respiratory distress and no signs of systemic toxicity. The history, exam, and current condition do not demonstrate an infectious process such as meningitis, severe pneumonia, retropharyngeal abscess, epiglottitis, sepsis or other serious bacterial infection requiring further testing, treatment, consultation, or admission at this time. The vital signs have been stable. The patient's condition is stable and appropriate for discharge. The patient will pursue further outpatient evaluation with the primary care physician as indicated in the discharge instructions.        Clinical Impression & Disposition     Clinical Impression  Final diagnoses:   Viral illness        ED Disposition     ED Disposition  Condition Date/Time Comment    Discharge  Wed Sep 24, 2019  9:16 AM Makylah Arvilla Meres discharge to home/self care.    Condition at disposition: Stable           Discharge Medication List as of 09/24/2019  9:16 AM      START taking these medications    Details   !! albuterol sulfate HFA (PROVENTIL) 108 (90 Base) MCG/ACT inhaler Inhale 1-2 puffs into the lungs every 4 (four) hours as needed for Wheezing or Shortness of Breath, Starting Wed 09/24/2019, E-Rx      benzonatate (TESSALON) 100 MG capsule Take 1 capsule (100 mg total) by mouth 3 (three) times daily as needed for Cough, Starting Wed 09/24/2019, E-Rx      !! ondansetron (ZOFRAN-ODT) 4 MG disintegrating tablet Take 1 tablet (4 mg total) by mouth every 8 (eight) hours as needed for Nausea, Starting Wed 09/24/2019, E-Rx      predniSONE (DELTASONE) 20 MG tablet Take 3 tablets (60 mg total) by mouth daily for 5 days, Starting Wed 09/24/2019, Until Mon 09/29/2019, E-Rx       !! - Potential duplicate medications found. Please discuss with provider.                    Osa Craver, MD  09/24/19 1011

## 2019-09-24 NOTE — EDIE (Signed)
COLLECTIVE?NOTIFICATION?09/24/2019 09:07?LUVERTA, KORTE M?MRN: [id number]    [facility] - [facility]'s patient encounter information:   VWU:?[id number]  Account [id number]  Billing Account [id number]      Criteria Met      High Utilization (6+ ED Visits/6 Mo.)    Security and Safety  No recent Security Events currently on file    ED Care Guidelines  There are currently no ED Care Guidelines for this patient. Please check your facility's medical records system.        Prescription Monitoring Program  000??- Narcotic Use Score  000??- Sedative Use Score  000??- Stimulant Use Score  000??- Overdose Risk Score  - All Scores range from 000-999 with 75% of the population scoring < 200 and on 1% scoring above 650  - The last digit of the narcotic, sedative, and stimulant score indicates the number of active prescriptions of that type  - Higher Use scores correlate with increased prescribers, pharmacies, mg equiv, and overlapping prescriptions  - Higher Overdose Risk Scores correlate with increased risk of unintentional overdose death   Concerning or unexpectedly high scores should prompt a review of the PMP record; this does not constitute checking PMP for prescribing purposes.      E.D. Visit Count (12 mo.)  Facility Visits   [facility] 1   [facility] 7   [place] [facility] 3   [facility] 3   Total 14   Note: Visits indicate total known visits.     Recent Emergency Department Visit Summary  Showing 10 most recent visits out of 14 in the past 12 months  Date Facility [facility] Type Diagnoses or Chief Complaint   Sep 24, 2019 [name] [place] [facility]      Fever; Headache; Nausea      Sep 13, 2019 [name] [place] [facility]      Short term memory loss, headache      Generalized Body Aches      Memory Loss      Weakness      Urinary tract infection, site not specified      May 24, 2019 [name] [place] [facility]      OverDose      Drug Overdose      Poisoning by unspecified drugs, medicaments and biological substances, undetermined, initial encounter      May 12, 2019 [name] [place] [facility]      SHOULDER INJURY      Shoulder Pain      Unspecified sprain of right shoulder joint, initial encounter      May 03, 2019 [facility] [place] [facility]      SBO      Abdominal Pain      Unspecified intestinal obstruction, unspecified as to partial versus complete obstruction      May 03, 2019 [name] [place] [facility]      vomiting, abdominal pain      Abdominal Pain      Emesis      Complete intestinal obstruction, unspecified as to cause      Vomiting of fecal matter      Mar 11, 2019 [facility] [place] [place] [facility]      abd pain      Unspecified intestinal obstruction, unspecified as to partial  versus complete obstruction      Mar 11, 2019 [name] [place] [facility]      Abdominal Pain      Unspecified intestinal obstruction, unspecified as to partial versus complete obstruction      Mar 08, 2019 [facility] [place] [place] [facility]      abd pain      Abdominal Pain      Unspecified intestinal obstruction, unspecified as to partial versus complete obstruction      Mar 07, 2019 [name] [place] [facility]      vomiting, diarrhea, stomach pain      Abdominal Pain      Unspecified intestinal obstruction, unspecified as to partial versus complete obstruction          Recent Inpatient Visit Summary  Date Facility [facility] Type Diagnoses or Chief Complaint   May 24, 2019 [facility] [place] [place] [facility]      Poisoning by unspecified drugs, medicaments and biological substances, accidental (unintentional), initial encounter      Fibromyalgia      May 03, 2019 [facility] [place] [place] [facility]      Unspecified intestinal obstruction, unspecified as to  partial versus complete obstruction      Other chronic pain      Unspecified abdominal pain      Other specified functional intestinal disorders      Generalized anxiety disorder      Fibromyalgia      Bipolar disorder, unspecified      Mar 11, 2019 [facility] [place] [place] [facility]      Unspecified intestinal obstruction, unspecified as to partial versus complete obstruction      Mar 08, 2019 [facility] [place] [place] [facility]      Unspecified intestinal obstruction, unspecified as to partial versus complete obstruction      Dec 05, 2018 [facility] [place] [place] [facility]      Unspecified intestinal obstruction, unspecified as to partial versus complete obstruction          Care Team  Provider Specialty Phone Fax Service Dates   Adline Mango, MD Family Medicine [phone] Jan 24, 2019 - Current    Adline Mango Primary Care [phone]  Jan 24, 2019 - Current    [facility]   Current      Collective Portal  This patient has registered at the [facility] Emergency Department   For more information visit: [email or web address]     PLEASE NOTE:     1.   Any care recommendations and other clinical information are provided as guidelines or for historical purposes only, and providers should exercise their own clinical judgment when providing care.    2.   You may only use this information for purposes of treatment, payment or health care operations activities, and subject to the limitations of applicable Collect[employer] Policies.    3.   You should consult directly with the organization that provided a care guideline or other clinical history with any questions about additional information or accuracy or completeness of information provided.    ? 2021 [place], [employer] - [email or web address]

## 2019-10-02 ENCOUNTER — Encounter (RURAL_HEALTH_CENTER): Payer: Self-pay | Admitting: Family

## 2019-10-02 ENCOUNTER — Ambulatory Visit: Payer: Medicare Managed Care Other | Attending: Family | Admitting: Family

## 2019-10-02 VITALS — BP 115/74 | HR 111 | Resp 18 | Ht 65.0 in | Wt 142.0 lb

## 2019-10-02 DIAGNOSIS — R110 Nausea: Secondary | ICD-10-CM

## 2019-10-02 MED ORDER — PROMETHAZINE HCL 12.5 MG PO TABS
12.5000 mg | ORAL_TABLET | Freq: Three times a day (TID) | ORAL | 0 refills | Status: DC | PRN
Start: 2019-10-02 — End: 2019-11-10

## 2019-10-02 NOTE — Patient Instructions (Signed)
If nausea/vomiting worsens please go to the ER.

## 2019-10-02 NOTE — Progress Notes (Signed)
PROGRESS NOTE      Patient Name: [name]  Primary Care Physician: Kathi Simpers, NP      History of Presenting Illness:   [name] is a 34 y.o. female who presents to the office with   Chief Complaint   Patient presents with   . Nausea     COVID negative last week.   . Abdominal Pain     Patient presents with nausea and abdominal discomfort that started last week.  Patient states she went to the hospital last week because she was concerned with COVID.  Patient has been vomiting, fevers, diarrhea, body aches.  Patient states she was initially ill, then she got her kids sick.  Patient states that she is always in abdominal pain, but she does not feel that she is obstructed.  She states the ER thinks she likely has a GI bug.  The zofran she has not been effective in helping her nausea.  She has had minimal PO intake.      This visit was conducted with the use of a HIPPA compliant interactive audiovisual telecommunication that permitted real time communication between [employer] and myself, Darlen Round, FNP-C. Consent to participation and receive services was obtained,  the patient was located at home, while I was located at [facility] clinic.  This visit was changed from an in-person visit to a telehealth visit to lower the risk of exposure and / or spread of the current pandemic with the SARS CoV-2 virus. This is based on the guidelines from the [facility] and other health agencies.  Past Medical History:     Past Medical History:   Diagnosis Date   . Anxiety    . Bipolar 1 disorder    . Chronic abdominal pain    . Chronic hepatitis C 05/03/2017   . Chronic obstructive pulmonary disease     not on home oxygen   . Crohn's disease 01/05/2016   . 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 Fibromyalgia    . Gastroesophageal reflux disease    . Insomnia    .  Seasonal allergic rhinitis        Past Surgical History:     Past Surgical History:   Procedure Laterality Date   . APPENDECTOMY     . CHOLECYSTECTOMY     . Double Barrel Enterostomy  2013    for SBO   . EGD, COLONOSCOPY N/A 08/19/2019    Procedure: EGD, COLONOSCOPY;  Surgeon: Gwenith Spitz, MD;  Location: [facility];  Service: Gastroenterology;  Laterality: N/A;   . Enterostomy reversal  2014    double barrel small bowel ostomies reanastomosed   . ILEOSTOMY  2011    for SBO after colectomy   . ILEOSTOMY CLOSURE  2012    multiple times   . LAPAROTOMY, COLECTOMY, TOTAL  2011    only a rectum remains   . OVARIAN CYST SURGERY  2011-2016    several   . SALPINGECTOMY, OPEN Right     2011       Family History:     Family History   Problem Relation Age of Onset   . Hypertension Mother    . Diabetes Mother    . Leukemia Father    . Liver disease Maternal Grandfather    . Hyperlipidemia Paternal Grandfather  Social History:     Social History     Tobacco Use   Smoking Status Current Every Day Smoker   . Packs/day: 1.00   . Years: 20.00   . Pack years: 20.00   . Types: Cigarettes   Smokeless Tobacco Never Used     Social History     Substance and Sexual Activity   Alcohol Use Never     Social History     Substance and Sexual Activity   Drug Use Not Currently   . 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 Fentanyl Anaphylaxis   . Ketorolac Tromethamine Itching   . Morphine Itching   . Vancomycin Other (See Comments) and Edema     throat swelling and turns red    . Latex Itching   . Oxycodone Hives   . Oxycodone-Acetaminophen Hives   . Reglan [Metoclopramide] Hives   . Rocephin [Ceftriaxone] Hives   . Tramadol Itching and Nausea And Vomiting   . Walnuts [Tree Nuts] Other (See Comments)     diverticulitis        Medications:     Prior to Admission medications    Medication Sig Start Date End Date Taking? Authorizing Provider   albuterol sulfate HFA  (PROVENTIL) 108 (90 Base) MCG/ACT inhaler Inhale 2 puffs into the lungs every 4 (four) hours as needed for Wheezing or Shortness of Breath 12/13/18 12/13/19 Yes Sharp, Skyler T, NP   albuterol sulfate HFA (PROVENTIL) 108 (90 Base) MCG/ACT inhaler Inhale 1-2 puffs into the lungs every 4 (four) hours as needed for Wheezing or Shortness of Breath 09/24/19  Yes Fadia, Artis Delay, MD   benzonatate (TESSALON) 100 MG capsule Take 1 capsule (100 mg total) by mouth 3 (three) times daily as needed for Cough 09/24/19  Yes Fadia, Ankur S, MD   buprenorphine-naloxone (SUBOXONE) 8-2 MG Film Place 1 tablet under the tongue 2 (two) times daily   Yes [provider]   clonazePAM (KlonoPIN) 0.25 MG disintegrating tablet Take 0.25 mg by mouth as needed 06/14/19  Yes [provider]   diphenoxylate-atropine (LOMOTIL) 2.5-0.025 MG per tablet TAKE 2 TABLETS BY MOUTH 4 TIMES DAILY 09/07/19  Yes Lambert Mody, Skyler T, NP   docusate sodium (COLACE) 100 MG capsule Take 1 capsule (100 mg total) by mouth 2 (two) times daily as needed for Constipation 05/06/19  Yes Theotis Burrow, NP   fluticasone-salmeterol (ADVAIR HFA) 230-21 MCG/ACT inhaler Inhale 2 puffs into the lungs 2 (two) times daily 12/13/18  Yes Sharp, Skyler T, NP   hydrOXYzine (ATARAX) 25 MG tablet Take 50 mg by mouth 07/18/19  Yes [provider]   mirtazapine (REMERON) 15 MG tablet Take 15 mg by mouth 06/02/19  Yes [provider]   naloxone (NARCAN) 4 MG/0.1ML nasal spray 1 spray intranasally. If pt does not respond or relapses into respiratory depression call 911. Give additional doses every 2-3 min. 03/10/19  Yes Arther Abbott, MD   ondansetron (ZOFRAN-ODT) 4 MG disintegrating tablet Take 1 tablet (4 mg total) by mouth every 8 (eight) hours as needed for Nausea 09/24/19  Yes Osa Craver, MD   pregabalin (LYRICA) 150 MG capsule Take 150 mg by mouth 3 (three) times daily 07/09/19  Yes [provider]   tiZANidine (ZANAFLEX) 4 MG tablet Take 1  tablet (4 mg total) by mouth every 8 (  eight) hours as needed (muscle spasm) 06/02/19  Yes Sharp, Skyler T, NP   topiramate (TOPAMAX) 25 MG tablet Take 25 mg by mouth every 12 (twelve) hours      Yes [provider]   traZODone (DESYREL) 300 MG tablet Take 300 mg by mouth nightly   Yes [provider]   venlafaxine (EFFEXOR-XR) 150 MG 24 hr capsule Take 150 mg by mouth daily   Yes [provider]   diclofenac sodium 3 % Gel Apply 1 application topically 4 (four) times daily as needed (shoulder pain) 05/23/19 10/02/19  Marja Kays T, NP   nicotine (NICODERM CQ) 14 MG/24HR Place 1 patch onto the skin every 24 hours for 28 days 10/10/19 10/02/19  Marja Kays T, NP   nicotine (NICODERM CQ) 21 MG/24HR Place 1 patch onto the skin every 24 hours for 28 days 09/11/19 10/02/19  Marja Kays T, NP   nicotine (NICODERM CQ) 7 MG/24HR Place 1 patch onto the skin daily for 14 days 11/08/19 10/02/19  Marja Kays T, NP   ondansetron (Zofran ODT) 4 MG disintegrating tablet Take 1 tablet (4 mg total) by mouth every 8 (eight) hours as needed for Nausea 07/07/19 10/02/19  Marja Kays T, NP   propranolol (INDERAL) 20 MG tablet Take 20 mg by mouth 3 (three) times daily as needed    04/03/19 10/02/19  [provider]       Review of Systems:      Constitutional: Negative for fever, fatigue or weight changes.   HENT: Negative for ear pain, congestion, rhinorrhea and neck pain.    Eyes: Negative for discharge, redness and itching.   Respiratory: Negative for cough and shortness of breath.    Cardiovascular: Negative for chest pain, palpitations and leg swelling.   Gastrointestinal: Positive for nausea, vomiting, diarrhea.   Genitourinary: Negative for dysuria, urgency and difficulty urinating.   Endocrine: Negative for polyuria, polydipsia, polyphagia, or heat/cold intolerances.  Musculoskeletal: Negative for joint swelling and gait problem.   Neurological: Negative for dizziness, weakness and headaches.   Skin:  Negative for rashes or lesions.  Psychiatric/Behavioral: Negative.      Physical Exam:     Vitals:    10/02/19 1018   BP: 115/74   Pulse: (!) 111   Resp: 18     Body mass index is 23.63 kg/m.    General:  no acute distress. Pleasant and well groomed.  Neck: no JVD, no tracheal deviation  Lungs: Effort normal, no use accessory muscles.  Neuro:  alert and oriented.   Skin: no rashes or lesions noted  Psychiatric/Behavioral: appropriate affect, mood and behavior.    Physical Exam     Assessment:     1. Nausea          Medical Decision Making:  Discussed with patient one time fill of phenergan.  Discussed do not take and drive.    Reinforced follow up with her GI doctor.    Reinforced ER precautions.     I have provided the patient with the plan of care.  The patient denies any further questions, will follow up as needed.    Plan:   Patient Instructions   If nausea/vomiting worsens please go to the ER.           Requested Prescriptions     Signed Prescriptions Disp Refills   . promethazine (PHENERGAN) 12.5 MG tablet 20 tablet 0     Sig: Take 1 tablet (12.5 mg total) by mouth  every 8 (eight) hours as needed for Nausea        Patient was counseled on possible medicine side effects which may include rash, swelling and/or stomach upset.  Patient was instructed to notify me or the ER if they experience problems.    No orders of the defined types were placed in this encounter.       Before leaving the office, the patient was informed of all ordered diagnostic tests and consults.     If you have not heard back from [place] about test results in 14 days, please call the office at [phone].    Signed by: Volney Presser, NP    Supervising Doctor: Rogelio Seen, MD    The patient's electronic medical record was reviewed, any changes in the past medical history, past surgical history, medications, diagnostic tests were noted, and the record was updated accordingly.     Discussed option available for my chart which  provides electronic access to diagnostic results.

## 2019-10-10 ENCOUNTER — Emergency Department: Payer: Medicare Managed Care Other

## 2019-10-10 ENCOUNTER — Emergency Department
Admission: EM | Admit: 2019-10-10 | Discharge: 2019-10-10 | Disposition: A | Discharge status: Home / Self Care | Payer: Medicare Managed Care Other | Attending: Internal Medicine | Admitting: Internal Medicine

## 2019-10-10 ENCOUNTER — Emergency Department
Admission: EM | Admit: 2019-10-10 | Discharge: 2019-10-10 | Disposition: A | Discharge status: Home / Self Care | Payer: Medicare Managed Care Other | Source: Home / Self Care | Attending: Emergency Medicine | Admitting: Emergency Medicine

## 2019-10-10 DIAGNOSIS — M79601 Pain in right arm: Secondary | ICD-10-CM

## 2019-10-10 DIAGNOSIS — I82621 Acute embolism and thrombosis of deep veins of right upper extremity: Secondary | ICD-10-CM

## 2019-10-10 DIAGNOSIS — F1111 Opioid abuse, in remission: Secondary | ICD-10-CM | POA: Insufficient documentation

## 2019-10-10 DIAGNOSIS — W109XXA Fall (on) (from) unspecified stairs and steps, initial encounter: Secondary | ICD-10-CM | POA: Insufficient documentation

## 2019-10-10 DIAGNOSIS — Z765 Malingerer [conscious simulation]: Secondary | ICD-10-CM | POA: Insufficient documentation

## 2019-10-10 MED ORDER — LIDOCAINE 5 % EX PTCH
MEDICATED_PATCH | CUTANEOUS | Status: AC
Start: 2019-10-10 — End: ?
  Filled 2019-10-10: qty 1

## 2019-10-10 MED ORDER — APIXABAN 5 MG PO TABS
ORAL_TABLET | ORAL | Status: AC
Start: 2019-10-10 — End: ?
  Filled 2019-10-10: qty 2

## 2019-10-10 MED ORDER — LIDOCAINE 5 % EX PTCH
1.0000 | MEDICATED_PATCH | CUTANEOUS | 0 refills | Status: DC
Start: 2019-10-10 — End: 2019-10-13

## 2019-10-10 MED ORDER — DIPHENHYDRAMINE HCL 50 MG/ML IJ SOLN
INTRAMUSCULAR | Status: AC
Start: 2019-10-10 — End: ?
  Filled 2019-10-10: qty 1

## 2019-10-10 MED ORDER — ACETAMINOPHEN 325 MG PO TABS
650.0000 mg | ORAL_TABLET | Freq: Four times a day (QID) | ORAL | 0 refills | Status: AC | PRN
Start: 2019-10-10 — End: ?

## 2019-10-10 MED ORDER — IBUPROFEN 800 MG PO TABS
800.0000 mg | ORAL_TABLET | Freq: Three times a day (TID) | ORAL | 0 refills | Status: DC | PRN
Start: 2019-10-10 — End: 2019-10-27

## 2019-10-10 MED ORDER — LIDOCAINE 5 % EX PTCH
1.0000 | MEDICATED_PATCH | Freq: Once | CUTANEOUS | Status: DC
Start: 2019-10-10 — End: 2019-10-10
  Administered 2019-10-10: 17:00:00 1 via TRANSDERMAL

## 2019-10-10 MED ORDER — VH HYDROMORPHONE HCL PF 1 MG/ML CARPUJECT
INTRAMUSCULAR | Status: AC
Start: 2019-10-10 — End: ?
  Filled 2019-10-10: qty 1

## 2019-10-10 MED ORDER — HALOPERIDOL LACTATE 5 MG/ML IJ SOLN
10.0000 mg | Freq: Once | INTRAMUSCULAR | Status: AC
Start: 2019-10-10 — End: 2019-10-10
  Administered 2019-10-10: 18:00:00 10 mg via INTRAMUSCULAR

## 2019-10-10 MED ORDER — APIXABAN 5 MG PO TABS
10.0000 mg | ORAL_TABLET | Freq: Once | ORAL | Status: AC
Start: 2019-10-10 — End: 2019-10-10
  Administered 2019-10-10: 09:00:00 10 mg via ORAL

## 2019-10-10 MED ORDER — HALOPERIDOL LACTATE 5 MG/ML IJ SOLN
INTRAMUSCULAR | Status: AC
Start: 2019-10-10 — End: ?
  Filled 2019-10-10: qty 2

## 2019-10-10 MED ORDER — VH HYDROMORPHONE HCL PF 1 MG/ML CARPUJECT
1.0000 mg | Freq: Once | INTRAMUSCULAR | Status: AC
Start: 2019-10-10 — End: 2019-10-10
  Administered 2019-10-10: 08:00:00 1 mg via INTRAMUSCULAR

## 2019-10-10 MED ORDER — DIPHENHYDRAMINE HCL 50 MG/ML IJ SOLN
50.0000 mg | Freq: Once | INTRAMUSCULAR | Status: AC
Start: 2019-10-10 — End: 2019-10-10
  Administered 2019-10-10: 17:00:00 50 mg via INTRAMUSCULAR

## 2019-10-10 MED ORDER — APIXABAN STARTER PACK 5 MG PO TBPK
5.0000 mg | ORAL_TABLET | Freq: Two times a day (BID) | ORAL | 0 refills | Status: DC
Start: 2019-10-10 — End: 2019-11-10

## 2019-10-10 NOTE — EDIE (Signed)
COLLECTIVE?NOTIFICATION?10/10/2019 07:35?JENNAMARIE, GOINGS M?MRN: [id number]    [facility]'s patient encounter information:   VWU:[id number]  Account [id number]  Billing Account [id number]      Criteria Met      High Utilization (6+ ED Visits/6 Mo.)    Security and Safety  No recent Security Events currently on file    ED Care Guidelines  There are currently no ED Care Guidelines for this patient. Please check your facility's medical records system.    Flags      Negative COVID-19 Lab Result - [facility] - A specimen collected from this patient was negative for COVID-19 / Attributed By: [place] Department of [facility] / Attributed On: 09/25/2019       Prescription Monitoring Program  851??- Narcotic Use Score  832??- Sedative Use Score  000??- Stimulant Use Score  710??- Overdose Risk Score  - All Scores range from 000-999 with 75% of the population scoring < 200 and on 1% scoring above 650  - The last digit of the narcotic, sedative, and stimulant score indicates the number of active prescriptions of that type  - Higher Use scores correlate with increased prescribers, pharmacies, mg equiv, and overlapping prescriptions  - Higher Overdose Risk Scores correlate with increased risk of unintentional overdose death   Concerning or unexpectedly high scores should prompt a review of the PMP record; this does not constitute checking PMP for prescribing purposes.      E.D. Visit Count (12 mo.)  Facility Visits   [facility] 8   [place] [facility] 3   [facility] 3   Total 14   Note: Visits indicate total known visits.     Recent Emergency Department Visit Summary  Showing 10 most recent visits out of 14 in the past 12 months  Date Facility [facility] Type Diagnoses or Chief Complaint   Oct 10, 2019 [name] [place] [facility]      Arm Pain and swelling      Sep 24, 2019 [name] [place] [facility]       Fever; Headache; Nausea      Viral infection, unspecified      Sep 13, 2019 [name] [place] [facility]      Short term memory loss, headache      Generalized Body Aches      Memory Loss      Weakness      Urinary tract infection, site not specified      May 24, 2019 [name] [place] [facility]      OverDose      Drug Overdose      Poisoning by unspecified drugs, medicaments and biological substances, undetermined, initial encounter      May 12, 2019 [name] [place] [facility]      SHOULDER INJURY      Shoulder Pain      Unspecified sprain of right shoulder joint, initial encounter      May 03, 2019 [facility] [place] [facility]      SBO      Abdominal Pain      Unspecified intestinal obstruction, unspecified as to partial versus complete obstruction      May 03, 2019 [name] [place] [facility]      vomiting, abdominal pain      Abdominal Pain  Emesis      Complete intestinal obstruction, unspecified as to cause      Vomiting of fecal matter      Mar 11, 2019 [facility] [place] [place] [facility]      abd pain      Unspecified intestinal obstruction, unspecified as to partial versus complete obstruction      Mar 11, 2019 [name] [place] [facility]      Abdominal Pain      Unspecified intestinal obstruction, unspecified as to partial versus complete obstruction      Mar 08, 2019 [facility] [place] [facility]      abd pain      Abdominal Pain      Unspecified intestinal obstruction, unspecified as to partial versus complete obstruction          Recent Inpatient Visit Summary  Date Facility [facility] Type Diagnoses or Chief Complaint   May 24, 2019 [facility] [place] [place] [facility]      Poisoning by unspecified drugs, medicaments and biological substances, accidental (unintentional), initial encounter      Fibromyalgia      May 03, 2019 [facility] [place] [place]  [facility]      Unspecified intestinal obstruction, unspecified as to partial versus complete obstruction      Other chronic pain      Unspecified abdominal pain      Other specified functional intestinal disorders      Generalized anxiety disorder      Fibromyalgia      Bipolar disorder, unspecified      Mar 11, 2019 [facility] [place] [place] [facility]      Unspecified intestinal obstruction, unspecified as to partial versus complete obstruction      Mar 08, 2019 [facility] [place] [facility]      Unspecified intestinal obstruction, unspecified as to partial versus complete obstruction      Dec 05, 2018 [facility] [place] [place] [facility]      Unspecified intestinal obstruction, unspecified as to partial versus complete obstruction          Care Team  Provider Specialty Phone Fax Service Dates   Adline Mango, MD Family Medicine [phone] Jan 24, 2019 - Current      Collective Portal  This patient has registered at the [facility] Emergency Department   For more information visit: [email or web address]     PLEASE NOTE:     1.   Any care recommendations and other clinical information are provided as guidelines or for historical purposes only, and providers should exercise their own clinical judgment when providing care.    2.   You may only use this information for purposes of treatment, payment or health care operations activities, and subject to the limitations of applicable Collective Policies.    3.   You should consult directly with the organization that provided a care guideline or other clinical history with any questions about additional information or accuracy or completeness of information provided.    ? 2021 [place] - [email or web address]

## 2019-10-10 NOTE — ED Notes (Signed)
Pt verbalized understanding of all discharge instructions and education.    Pt wanted "to go home and just pass out" right after med admin.

## 2019-10-10 NOTE — ED Provider Notes (Signed)
Physician/Midlevel provider first contact with patient: 10/10/19 0741         History     Chief Complaint   Patient presents with    Arm Injury     Patient presents with right shoulder pain and right arm swelling.  Patient has a history of rotator cuff tear that was found on MRI back in May of this year.  Patient about 5 days ago was going up stairs carrying laundry when she slipped and fell.  Patient landed with her right arm above her head outstretched.  Since then she has been having severe pain in her right shoulder.  Will radiate into the chest, right posterior neck and down the right arm.  Is worse with any motion.  Also over the past 4 to 5 days she has been having increasing swelling in her right arm.  Is given a tight discomfort.  The swelling is markedly increased this morning but has decreased some down since she has been up moving around.  She denies any numbness or tingling.  She has no obvious deformities as well.  Patient without any associated temperature, fever, chills, URI symptoms, cough or shortness of breath.           Nursing (triage) note reviewed for the following pertinent information:  Pt here with pain and edema to the R arm. She reports falling while going up stairs 4-5 days ago and her arm was lifted above her head, she felt a pop and has been in pain since. She does report tearing her rotator cuff on that side in May.    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     No mechanical SBO - atonic bowel with 6 hour SB transit time; No COLON present    Fibromyalgia     Gastroesophageal reflux disease     Insomnia     Seasonal allergic rhinitis        Past Surgical History:   Procedure Laterality Date    APPENDECTOMY       CHOLECYSTECTOMY      Double Barrel Enterostomy  2013    for SBO    EGD, COLONOSCOPY N/A 08/19/2019    Procedure: EGD, COLONOSCOPY;  Surgeon: Gwenith Spitz, MD;  Location: [facility];  Service: Gastroenterology;  Laterality: N/A;    Enterostomy reversal  2014    double barrel small bowel ostomies reanastomosed    ILEOSTOMY  2011    for SBO after colectomy    ILEOSTOMY CLOSURE  2012    multiple times    LAPAROTOMY, COLECTOMY, TOTAL  2011    only a rectum remains    OVARIAN CYST SURGERY  2011-2016    several    SALPINGECTOMY, OPEN Right     2011       Family History   Problem Relation Age of Onset    Hypertension Mother     Diabetes Mother     Leukemia Father     Liver disease Maternal Grandfather     Hyperlipidemia Paternal Grandfather        Social  Social History     Tobacco Use    Smoking  status: Current Every Day Smoker     Packs/day: 1.00     Years: 20.00     Pack years: 20.00     Types: Cigarettes    Smokeless tobacco: Never Used   Vaping Use    Vaping Use: Never used   Substance Use Topics    Alcohol use: Never    Drug use: Not Currently     Types: Heroin     Comment: opiates -- last use 2013       .     Allergies   Allergen Reactions    Fentanyl Anaphylaxis    Ketorolac Tromethamine Itching    Morphine Itching    Vancomycin Other (See Comments) and Edema     throat swelling and turns red     Latex Itching    Oxycodone Hives    Oxycodone-Acetaminophen Hives    Reglan [Metoclopramide] Hives    Rocephin [Ceftriaxone] Hives    Tramadol Itching and Nausea And Vomiting    Walnuts [Tree Nuts] Other (See Comments)     diverticulitis        Home Medications             albuterol sulfate HFA (PROVENTIL) 108 (90 Base) MCG/ACT inhaler     Inhale 2 puffs into the lungs every 4 (four) hours as needed for Wheezing or Shortness of Breath     albuterol sulfate HFA (PROVENTIL) 108 (90 Base) MCG/ACT inhaler     Inhale 1-2 puffs into the lungs every 4 (four) hours as needed for Wheezing or  Shortness of Breath     benzonatate (TESSALON) 100 MG capsule     Take 1 capsule (100 mg total) by mouth 3 (three) times daily as needed for Cough     buprenorphine-naloxone (SUBOXONE) 8-2 MG Film     Place 1 tablet under the tongue 2 (two) times daily     clonazePAM (KlonoPIN) 0.25 MG disintegrating tablet     Take 0.25 mg by mouth as needed     diphenoxylate-atropine (LOMOTIL) 2.5-0.025 MG per tablet     TAKE 2 TABLETS BY MOUTH 4 TIMES DAILY     docusate sodium (COLACE) 100 MG capsule     Take 1 capsule (100 mg total) by mouth 2 (two) times daily as needed for Constipation     fluticasone-salmeterol (ADVAIR HFA) 230-21 MCG/ACT inhaler     Inhale 2 puffs into the lungs 2 (two) times daily     hydrOXYzine (ATARAX) 25 MG tablet     Take 50 mg by mouth     mirtazapine (REMERON) 15 MG tablet     Take 15 mg by mouth     naloxone (NARCAN) 4 MG/0.1ML nasal spray     1 spray intranasally. If pt does not respond or relapses into respiratory depression call 911. Give additional doses every 2-3 min.     ondansetron (ZOFRAN-ODT) 4 MG disintegrating tablet     Take 1 tablet (4 mg total) by mouth every 8 (eight) hours as needed for Nausea     pregabalin (LYRICA) 150 MG capsule     Take 150 mg by mouth 3 (three) times daily     promethazine (PHENERGAN) 12.5 MG tablet     Take 1 tablet (12.5 mg total) by mouth every 8 (eight) hours as needed for Nausea     tiZANidine (ZANAFLEX) 4 MG tablet     Take 1 tablet (4 mg total) by mouth every 8 (eight) hours as needed (muscle  spasm)     topiramate (TOPAMAX) 25 MG tablet     Take 25 mg by mouth every 12 (twelve) hours        traZODone (DESYREL) 300 MG tablet     Take 300 mg by mouth nightly     venlafaxine (EFFEXOR-XR) 150 MG 24 hr capsule     Take 150 mg by mouth daily           Review of Systems   Constitutional: Negative for fever.   HENT: Negative for congestion.    Respiratory: Negative for cough and shortness of breath.    Musculoskeletal: Negative for neck pain.   Skin: Negative for  rash and wound.   Neurological: Negative for weakness and numbness.       Physical Exam    BP: 127/89, Temp: 98.4 F (36.9 C), Resp Rate: 18, SpO2: 99 %    Physical Exam  Vitals and nursing note reviewed.   Constitutional:       General: She is not in acute distress.     Appearance: Normal appearance.   Musculoskeletal:         General: Swelling and tenderness present.      Right shoulder: Swelling, tenderness and bony tenderness present. Decreased range of motion. Normal pulse.        Arms:    Skin:     General: Skin is warm and dry.      Capillary Refill: Capillary refill takes less than 2 seconds.      Findings: No erythema or rash.   Neurological:      Mental Status: She is alert.      Sensory: No sensory deficit.      Motor: No weakness.   Psychiatric:         Mood and Affect: Mood normal.         Behavior: Behavior normal.         Labs  Results     ** No results found for the last 24 hours. **          Radiologic Studies  Radiology Results (24 Hour)     Procedure Component Value Units Date/Time    US VENOUS UP Austin Miles UNI RIGHT [[id number]] Collected: 10/10/19 4098    Order Status: Completed Updated: 10/10/19 0844    Narrative:      Clinical History:  Arm DVT suspected    Ordering Comments:   None.      Study Notes:   None.     Examination:  Two-dimensional grayscale, doppler spectral waveform analysis and color flow doppler imaging of the deep veins of the right upper extremity was performed from the forearm to subclavian, including the neck.    Comparison:  None available.    Findings:  Right internal jugular vein demonstrates normal phasic flow and is fully compressible. Right subclavian vein demonstrates normal phasic flow without clot. Right axillary vein demonstrates normal phasic flow in the distal compressible without clot. The   right upper extremity cephalic vein and basilic veins are patent. There is occlusive thrombus within the right brachial vein extending from the level the mid biceps  level and peripherally to the antecubital vein confluence.      Impression:      DVT of the right brachial vein as above, without central venous thrombus. Findings notified to clinical service at at time of study per ultrasound technologist.    ReadingStation:[id number]    XR Shoulder Right 2+ Views [[id number]] Collected:  10/10/19 0809    Order Status: Completed Updated: 10/10/19 0812    Narrative:      EXAMINATION: XR SHOULDER RIGHT 2+ VIEWS, 3 view  CLINICAL HISTORY: pain post fall  STUDY NOTES: Fall few days ago patient has swelling into finger tips   ADDITIONAL HISTORY: None.  COMPARISON: 4/19.    FINDINGS:  No fracture, dislocation, or radiopaque foreign body.   AC joint: Intact.      Impression:      IMPRESSION: No fracture.    ReadingStation:[id number]      .    EKG Results  Last EKG Result     None            MDM and ED Course     ED Medication Orders (From admission, onward)    Start Ordered     Status Ordering Provider    10/10/19 0910 10/10/19 0909  apixaban (ELIQUIS) tablet 10 mg  Once in ED     Route: Oral  Ordered Dose: 10 mg     Acknowledged Osa Craver    10/10/19 0752 10/10/19 0751  HYDROmorphone (DILAUDID) injection 1 mg  Once     Route: Intramuscular  Ordered Dose: 1 mg     Last MAR action: Given ALLEN, GREGORY W           MDM  Patient with right shoulder pain and right arm swelling post fall 5 days ago.  Examination shows patient be afebrile stable vital signs.  Right shoulder is tender anterior and painful with any motion.  There is severe swelling distally.  Pulses are intact and sensation intact.    Differential diagnosis include but not limited to fracture versus strain and DVT.  Plan is to obtain imaging of x-ray as well as ultrasound and then reevaluate patient.    9:00 AM care of patient has been assigned to oncoming team.    I, Dr. Iva Lento, took care of this patient at shift change briefly from 9 AM until 9:10 AM.  I noticed that she has a deep venous thrombosis that is acute in her right  upper extremity.  I have started her on Eliquis.  I informed her to follow-up with her primary care provider immediately.  She voiced understanding.  She understands the risks, benefits, and indication for Eliquis treatment.  She confirms that she has no significant bleeding history.  She has pain medications at home with a narcotic score of 861 according to the [place] prescription monitoring program.             Clinical Impression & Disposition     Clinical Impression  Final diagnoses:   Acute deep vein thrombosis (DVT) of brachial vein of right upper extremity        ED Disposition     ED Disposition Condition Date/Time Comment    Discharge  Fri Oct 10, 2019  9:13 AM [name] discharge to home/self care.    Condition at disposition: Stable           New Prescriptions    APIXABAN STARTER PACK 5 MG TABLET THERAPY PACK    Take 5 mg by mouth 2 (two) times daily Take 10 mg twice daily for 7 days followed by 5 mg twice daily.                 Osa Craver, MD  10/10/19 [phone]

## 2019-10-10 NOTE — Discharge Instructions (Signed)
Discharge Instructions for Deep Vein Thrombosis (DVT)  A blood clot or thrombus that forms in a large, deep vein is called a deep vein thrombosis (DVT). If a DVT is not treated, part of the clot (embolus) can break off and travel to your lungs. This is called a pulmonary embolus (PE). This can cut off the flow of blood to part or all of the lung. PE is a medical emergency and may cause death.   Healthcare providers use the term venous thromboembolism (VTE) to describe these two conditions: DVT and PE. They use the term VTE because the two conditions are very closely related. And because their prevention and treatment are also closely related.   Follow all instructions for taking your medicine, follow-up care, and diet and lifestyle changes.  Medicine  Your healthcare provider will usually prescribe a blood-thinner (anticoagulant) medicine. This medicine helps prevent new blood clots. Blood thinners can be given by mouth (oral), by shot (injection), or into your vein (intravenous or IV). Commonly used blood thinners include warfarin and heparin. Newer blood thinners may also be used. They include rivaroxaban, apixaban, dabigatran, and enoxaparin. Your healthcare provider will give specific instructions on how to take your medicine. You may take more than one type for a period of time.  Take your blood thinner exactly as directed. If you miss a dose, call your healthcare provider to find out what you should do. These medicines increase the chance of bleeding. So it's very important to take them correctly. Be sure to tell all of your healthcare providers, including dentists, that you are taking a blood thinner.  Follow-up monitoring  You’ll need to have your blood tested on a regular schedule. Your healthcare provider will tell you how often you need to have your blood tested. This is to make sure you’re taking the right amount of warfarin. Too much can cause excess bleeding, which can be very serious. Too little may  not prevent blood clots from harming you.  The blood tests check your international normalized ratio (INR) and prothrombin time (PT). These show how quickly your blood clots. Together the test is called PT/INR.  You may need to visit a hospital or clinic to have your blood tested. Or a nurse may come to your home and test your blood. In some cases, you may be able to test your blood at home with a small machine. Talk with your healthcare provider to find out what’s best for you. Don't miss any appointments to get your blood tested. If you have a blood test outside of your healthcare provider’s office, make sure to call him or her as soon as you get your test results.  After the blood test, your healthcare provider may tell you to change your dose of warfarin. Take the medicine exactly as directed. Don’t stop taking it unless your healthcare provider tells you to.  Diet and warfarin  Vitamin K can interact with warfarin and reduce its ability to thin your blood. Vitamin K helps your blood clot. So sudden changes in vitamin K intake can affect the way warfarin works. You don’t need to stop eating foods with vitamin K. Instead, keep the amount you eat about the same each day. Foods high in vitamin K include:  · Leafy green vegetables such as spinach, cabbage, and kale  · Avocado  · Asparagus  · Egg yolks  · Oils like canola, olive, and soybean  When taking warfarin, don't change your diet without first checking with your healthcare provider.  The other blood thinners don't have the same interaction with vitamin K that warfarin does.   Medicines and your anticoagulant  Some medicines may cause problems with blood thinners. Check with your healthcare provider before making any changes to   your medicines. And don't take over-the-counter (OTC) medicines without checking with your provider. Some medicines interact with your blood thinner and make your blood too thin. This increases your risk of bleeding. Others may stop your  blood thinner from doing its job, making your blood too thick. So it's very important to tell your healthcare provider about all of the medicines you take, including OTCs and herbal supplements. Don't start or stop taking any medicine, including OTCs, unless your healthcare provider tells you to.  Medicines that may cause problems with your blood thinner include:  · Some antibiotics  · Some heart medicines  · Cimetidine  · Aspirin or other nonsteroidal anti-inflammatory drugs (NSAIDs) such as ibuprofen or naproxen.  · Some medicines for depression, cancer, HIV infection, diabetes, seizures, gout, high cholesterol, or thyroid disease  · Vitamins with vitamin K  · Some herbal products such as St. John's wort, garlic, coenzyme Q10, turmeric, and ginkgo biloba  Home care  To help prevent blood clots, try the following:  · Wiggle your toes and move your ankles while sitting or lying down.  · When traveling by car, make frequent stops to get up and move around.  · On long airplane rides, get up and move around when possible. If you can’t get up, wiggle your toes, move your ankles and tighten your calves to keep your blood moving.  · If you have to stay in bed, do leg exercises.  · Wear support or compression stockings, if prescribed by your healthcare provider.  · Rest and put your legs up whenever they feel swollen or heavy.  · Raise the foot of your mattress 5 to 6 inches, using a foam wedge.  Lifestyle changes  To help you stay healthy, especially your heart and blood vessels, you should:  · Start an exercise program, if you are not exercising. Ask your healthcare provider how to get started. Try walking, inside or out.  · Stay at a healthy weight. Get help to lose any extra pounds (kilograms).  · Keep blood pressure in a healthy range  · If you smoke, make a plan to quit. Ask your healthcare provider about stop-smoking programs to help you quit.  Call 911  Call 911 right away if you have the following symptoms. They  may mean a blood clot in your lungs:  · Chest pain  · Trouble breathing  · Fast heartbeat  · Sweating  · Fainting  · Coughing (may cough up blood)  · Heavy or uncontrolled bleeding  When to call your healthcare provider  Call your healthcare provider if you have pain, swelling, or redness in your leg, arm, or other area. These symptoms may mean a blood clot.  If you take blood thinners and are bleeding, you may have:  · Blood in the urine   · Bleeding with bowel movements  · Very dark or tar-like stool  · Vomiting with blood  · Coughing with blood  · Bleeding from the nose  · Bleeding from the gums  · A cut that will not stop bleeding  · Bleeding from the vagina  [employer] last reviewed this educational content on 10/23/2017  © 2000-2021 [employer]. All rights reserved. This information is not intended as a substitute for professional medical care. Always follow your healthcare professional's instructions.

## 2019-10-10 NOTE — Discharge Instructions (Signed)
RICE    RICE stands for rest, ice, compression, and elevation. Doing these things helps limit pain and swelling after an injury. RICE also helps injuries heal faster. Use RICE for sprains, strains, and severe bruises or bumps. Follow the tips on this handout and begin RICE as soon as possible after an injury.   Rest  Pain is your body's way of telling you to rest an injured area. Whether you have hurt an elbow, hand, foot, or knee, limiting its use will prevent further injury and help you heal.   Ice  Applying ice right after an injury helps prevent swelling and reduce pain. Don't place ice directly on your skin.    Wrap a cold pack or bag of ice in a thin cloth. Place it over the injured area.   Ice for 10minutes every 3hours. Don't ice for more than 20minutes at a time.  Compression  Putting pressure (compression) on an injury helps prevent swelling and provides support.   Wrap the injured area firmly with an elastic bandage. If your hand or foot tingles, becomes discolored, or feels cold to the touch, the bandage may be too tight. Rewrap it more loosely.   If your bandage becomes too loose, rewrap it.   Do not wear an elastic bandage overnight.  Elevation  Keeping an injury elevated helps reduce swelling, pain, and throbbing. Elevation is most effectivewhen the injury is kept elevated higher than the heart.   Call your healthcare provider if you notice any of the following:   Fingers or toes feel numb, are cold to the touch, or change color.   Skin looks shiny or tight.   Pain, swelling, or bruising worsens and is not improved with elevation.  [employer] last reviewed this educational content on 05/23/2016   2000-2021 [employer]. All rights reserved. This information is not intended as a substitute for professional medical care. Always follow your healthcare professional's instructions.

## 2019-10-10 NOTE — ED Provider Notes (Signed)
History   CC: Right arm pain, "I need something strong for my arm pain."    HPI   The patient is a 34 year old female with a past medical history of heroin abuse, chronic hepatitis C, currently on Suboxone who presents to the emergency department for the second time today wanting pain medications for her right arm pain.  She has allergies to multiple pain medications except for Dilaudid.  She received 1 milligram of Dilaudid by the previous ER provider earlier this morning.  The patient returns to the emergency department complaining of her ongoing right arm pain due to recent diagnosis of DVT.  I am very concerned about drug-seeking behavior. Per EDIE, this ER visit represents the patient's 15th ER visit over the past 12 months.  The patient rates the pain in her right arm as moderate to severe, sharp, nonradiating and exacerbated with palpation and movement.  She has no numbness or tingling or weakness anywhere.  She has no shortness of breath or chest pain.  She is otherwise in her usual state of health and is eating and drinking and swallowing normally.  She was prescribed Eliquis for treatment of this DVT.      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     No mechanical SBO - atonic bowel with 6 hour SB transit time; No COLON present    Fibromyalgia     Gastroesophageal reflux disease     Insomnia     Seasonal allergic rhinitis        Past Surgical History:   Procedure Laterality Date    APPENDECTOMY      CHOLECYSTECTOMY      Double Barrel Enterostomy  2013    for SBO    EGD, COLONOSCOPY N/A 08/19/2019    Procedure: EGD, COLONOSCOPY;  Surgeon: Gwenith Spitz, MD;  Location: [facility];  Service: Gastroenterology;  Laterality: N/A;     Enterostomy reversal  2014    double barrel small bowel ostomies reanastomosed    ILEOSTOMY  2011    for SBO after colectomy    ILEOSTOMY CLOSURE  2012    multiple times    LAPAROTOMY, COLECTOMY, TOTAL  2011    only a rectum remains    OVARIAN CYST SURGERY  2011-2016    several    SALPINGECTOMY, OPEN Right     2011       Family History   Problem Relation Age of Onset    Hypertension Mother     Diabetes Mother     Leukemia Father     Liver disease Maternal Grandfather     Hyperlipidemia Paternal Grandfather        Social  Social History     Tobacco Use    Smoking status: Current Every Day Smoker     Packs/day: 1.00     Years: 20.00     Pack years: 20.00     Types: Cigarettes    Smokeless tobacco: Never Used   Haematologist Use: Never used   Substance Use Topics    Alcohol use: Never    Drug use: Not Currently  Types: Heroin     Comment: opiates -- last use 2013       .     Allergies   Allergen Reactions    Fentanyl Anaphylaxis    Ketorolac Tromethamine Itching    Morphine Itching    Vancomycin Other (See Comments) and Edema     throat swelling and turns red     Latex Itching    Oxycodone Hives    Oxycodone-Acetaminophen Hives    Reglan [Metoclopramide] Hives    Rocephin [Ceftriaxone] Hives    Tramadol Itching and Nausea And Vomiting    Walnuts [Tree Nuts] Other (See Comments)     diverticulitis        Home Medications             albuterol sulfate HFA (PROVENTIL) 108 (90 Base) MCG/ACT inhaler     Inhale 2 puffs into the lungs every 4 (four) hours as needed for Wheezing or Shortness of Breath     albuterol sulfate HFA (PROVENTIL) 108 (90 Base) MCG/ACT inhaler     Inhale 1-2 puffs into the lungs every 4 (four) hours as needed for Wheezing or Shortness of Breath     Apixaban Starter Pack 5 MG Tablet Therapy Pack     Take 5 mg by mouth 2 (two) times daily Take 10 mg twice daily for 7 days followed by 5 mg twice daily.     benzonatate (TESSALON) 100 MG capsule     Take 1 capsule (100 mg  total) by mouth 3 (three) times daily as needed for Cough     buprenorphine-naloxone (SUBOXONE) 8-2 MG Film     Place 1 tablet under the tongue 2 (two) times daily     clonazePAM (KlonoPIN) 0.25 MG disintegrating tablet     Take 0.25 mg by mouth as needed     diphenoxylate-atropine (LOMOTIL) 2.5-0.025 MG per tablet     TAKE 2 TABLETS BY MOUTH 4 TIMES DAILY     docusate sodium (COLACE) 100 MG capsule     Take 1 capsule (100 mg total) by mouth 2 (two) times daily as needed for Constipation     fluticasone-salmeterol (ADVAIR HFA) 230-21 MCG/ACT inhaler     Inhale 2 puffs into the lungs 2 (two) times daily     hydrOXYzine (ATARAX) 25 MG tablet     Take 50 mg by mouth     mirtazapine (REMERON) 15 MG tablet     Take 15 mg by mouth     naloxone (NARCAN) 4 MG/0.1ML nasal spray     1 spray intranasally. If pt does not respond or relapses into respiratory depression call 911. Give additional doses every 2-3 min.     ondansetron (ZOFRAN-ODT) 4 MG disintegrating tablet     Take 1 tablet (4 mg total) by mouth every 8 (eight) hours as needed for Nausea     pregabalin (LYRICA) 150 MG capsule     Take 150 mg by mouth 3 (three) times daily     promethazine (PHENERGAN) 12.5 MG tablet     Take 1 tablet (12.5 mg total) by mouth every 8 (eight) hours as needed for Nausea     tiZANidine (ZANAFLEX) 4 MG tablet     Take 1 tablet (4 mg total) by mouth every 8 (eight) hours as needed (muscle spasm)     topiramate (TOPAMAX) 25 MG tablet     Take 25 mg by mouth every 12 (twelve) hours        traZODone (  DESYREL) 300 MG tablet     Take 300 mg by mouth nightly     venlafaxine (EFFEXOR-XR) 150 MG 24 hr capsule     Take 150 mg by mouth daily           Review of Systems   Constitutional: Negative for chills and fever.   HENT: Negative for trouble swallowing and voice change.    Eyes: Negative for visual disturbance.   Respiratory: Negative for chest tightness and shortness of breath.    Gastrointestinal: Negative for abdominal pain, nausea and  vomiting.   Endocrine: Negative for polyuria.   Genitourinary: Negative for dysuria.   Musculoskeletal: Negative for neck pain and neck stiffness.   Skin: Negative for pallor.   Allergic/Immunologic: Negative for immunocompromised state.   Neurological: Negative for dizziness.   Hematological: Negative for adenopathy.   Psychiatric/Behavioral: Negative for suicidal ideas.   All other systems reviewed and are negative.      Physical Exam    BP: 118/86, Heart Rate: 90, Temp: 98 F (36.7 C), Resp Rate: 17, SpO2: 99 %, Weight: 61.2 kg    Physical Exam  Vitals and nursing note reviewed.   Constitutional:       General: She is not in acute distress.     Appearance: She is not ill-appearing, toxic-appearing or diaphoretic.   HENT:      Head: Normocephalic and atraumatic.      Right Ear: External ear normal.      Left Ear: External ear normal.      Nose: Nose normal.      Mouth/Throat:      Mouth: Mucous membranes are moist.      Pharynx: Oropharynx is clear.   Eyes:      General: No scleral icterus.     Extraocular Movements: Extraocular movements intact.      Conjunctiva/sclera: Conjunctivae normal.      Pupils: Pupils are equal, round, and reactive to light.   Cardiovascular:      Rate and Rhythm: Normal rate and regular rhythm.      Pulses: Normal pulses.      Heart sounds: Normal heart sounds. No gallop.    Pulmonary:      Effort: Pulmonary effort is normal. No respiratory distress.      Breath sounds: Normal breath sounds. No stridor. No wheezing, rhonchi or rales.   Chest:      Chest wall: No tenderness.   Abdominal:      General: Abdomen is flat. Bowel sounds are normal. There is no distension.      Palpations: Abdomen is soft. There is no mass.      Tenderness: There is no abdominal tenderness. There is no right CVA tenderness, left CVA tenderness, guarding or rebound.   Musculoskeletal:         General: Swelling and tenderness present. No deformity. Normal range of motion.      Cervical back: Normal range of  motion and neck supple.      Right lower leg: No edema.      Left lower leg: No edema.   Lymphadenopathy:      Cervical: No cervical adenopathy.   Skin:     General: Skin is warm and dry.      Capillary Refill: Capillary refill takes less than 2 seconds.      Coloration: Skin is not jaundiced or pale.   Neurological:      General: No focal deficit present.  Mental Status: She is alert and oriented to person, place, and time. Mental status is at baseline.      Cranial Nerves: No cranial nerve deficit.      Sensory: No sensory deficit.      Motor: No weakness.      Coordination: Coordination normal.      Gait: Gait normal.      Deep Tendon Reflexes: Reflexes normal.   Psychiatric:      Comments: No suicidal or homicidal ideation.           MDM and ED Course     ED Medication Orders (From admission, onward)    Start Ordered     Status Ordering Provider    10/10/19 1716 10/10/19 1715  haloperidol lactate (HALDOL) injection 10 mg  Once in ED     Route: Intramuscular  Ordered Dose: 10 mg     Windell Hummingbird S    10/10/19 1716 10/10/19 1715  diphenhydrAMINE (BENADRYL) injection 50 mg  Once in ED     Route: Intramuscular  Ordered Dose: 50 mg     Windell Hummingbird S    10/10/19 1716 10/10/19 1715  lidocaine (LIDODERM) 5 % 1 patch  Once in ED     Route: Transdermal  Ordered Dose: 1 patch     Ordered Annmarie Plemmons, Murlean Iba S             MDM    The patient has no evidence of compartment syndrome.  She is fully neurovascularly intact in the right upper extremity.  I will not prescribe any opioid or controlled substance medications.  I am very concerned about drug-seeking behavior because she cites allergies to all different types of pain medications except for Dilaudid.  I advised supportive care and to follow-up with her primary care provider for any pain medications.  I will use alternatives to opioid (ALTO) medications for the treatment of pain in this patient who has serious addiction issues to opioids.         Clinical  Impression & Disposition     Clinical Impression  Final diagnoses:   History of heroin abuse   Right arm pain   Drug-seeking behavior        ED Disposition     ED Disposition Condition Date/Time Comment    Discharge  Fri Oct 10, 2019  5:16 PM Camylle Arvilla Meres discharge to home/self care.    Condition at disposition: Stable           New Prescriptions    ACETAMINOPHEN (TYLENOL) 325 MG TABLET    Take 2 tablets (650 mg total) by mouth every 6 (six) hours as needed for Pain    IBUPROFEN (ADVIL) 800 MG TABLET    Take 1 tablet (800 mg total) by mouth every 8 (eight) hours as needed for Pain    LIDOCAINE (LIDODERM) 5 %    Place 1 patch onto the skin every 24 hours Remove & Discard patch within 12 hours or as directed by MD                 Osa Craver, MD  10/10/19 1718

## 2019-10-10 NOTE — EDIE (Signed)
COLLECTIVE?NOTIFICATION?10/10/2019 16:24?[name], [name]?MRN: [id number]    [facility]'s patient encounter information:   NWG:[id number]  Account [id number]  Billing Account [id number]      Criteria Met      High Utilization (6+ ED Visits/6 Mo.)    Security and Safety  No recent Security Events currently on file    ED Care Guidelines  There are currently no ED Care Guidelines for this patient. Please check your facility's medical records system.    Flags      Negative COVID-19 Lab Result - [facility] - A specimen collected from this patient was negative for COVID-19 / Attributed By: [place] Department of [facility] / Attributed On: 09/25/2019       Prescription Monitoring Program  851??- Narcotic Use Score  832??- Sedative Use Score  000??- Stimulant Use Score  710??- Overdose Risk Score  - All Scores range from 000-999 with 75% of the population scoring < 200 and on 1% scoring above 650  - The last digit of the narcotic, sedative, and stimulant score indicates the number of active prescriptions of that type  - Higher Use scores correlate with increased prescribers, pharmacies, mg equiv, and overlapping prescriptions  - Higher Overdose Risk Scores correlate with increased risk of unintentional overdose death   Concerning or unexpectedly high scores should prompt a review of the PMP record; this does not constitute checking PMP for prescribing purposes.      E.D. Visit Count (12 mo.)  Facility Visits   [facility] [address] - [facility] 3   [facility] - [facility] 3   Total 15   Note: Visits indicate total known visits.     Recent Emergency Department Visit Summary  Showing 10 most recent visits out of 15 in the past 12 months  Date Facility [facility] Type Diagnoses or Chief Complaint   Oct 10, 2019 [name] [place] [facility]      Arm pain      Oct 10, 2019 [name] [place] [facility]      Arm Pain  and swelling      Arm Injury      Acute embolism and thrombosis of deep veins of right upper extremity      Sep 24, 2019 [name] [place] [facility]      Fever; Headache; Nausea      Viral infection, unspecified      Sep 13, 2019 [name] [place] [facility]      Short term memory loss, headache      Generalized Body Aches      Memory Loss      Weakness      Urinary tract infection, site not specified      May 24, 2019 [name] [place] [facility]      OverDose      Drug Overdose      Poisoning by unspecified drugs, medicaments and biological substances, undetermined, initial encounter      May 12, 2019 [name] [place] [facility]      SHOULDER INJURY      Shoulder Pain      Unspecified sprain of right shoulder joint, initial encounter      May 03, 2019 [facility] [place] [place] [facility]      SBO      Abdominal Pain  Unspecified intestinal obstruction, unspecified as to partial versus complete obstruction      May 03, 2019 [name] [place] [facility]      vomiting, abdominal pain      Abdominal Pain      Emesis      Complete intestinal obstruction, unspecified as to cause      Vomiting of fecal matter      Mar 11, 2019 [facility] [place] [place] [facility]      abd pain      Unspecified intestinal obstruction, unspecified as to partial versus complete obstruction      Mar 11, 2019 [name] [place] [facility]      Abdominal Pain      Unspecified intestinal obstruction, unspecified as to partial versus complete obstruction          Recent Inpatient Visit Summary  Date Facility [facility] Type Diagnoses or Chief Complaint   May 24, 2019 [facility] [place] [place] [facility]      Poisoning by unspecified drugs, medicaments and biological substances, accidental (unintentional), initial encounter      Fibromyalgia      May 03, 2019 [facility] [place] [place] [facility]      Unspecified  intestinal obstruction, unspecified as to partial versus complete obstruction      Other chronic pain      Unspecified abdominal pain      Other specified functional intestinal disorders      Generalized anxiety disorder      Fibromyalgia      Bipolar disorder, unspecified      Mar 11, 2019 [facility] [place] [place] [facility]      Unspecified intestinal obstruction, unspecified as to partial versus complete obstruction      Mar 08, 2019 [facility] [place] [place] [facility]      Unspecified intestinal obstruction, unspecified as to partial versus complete obstruction      Dec 05, 2018 [facility] [place] [place] [facility]      Unspecified intestinal obstruction, unspecified as to partial versus complete obstruction          Care Team  Provider Specialty Phone Fax Service Dates   Adline Mango, MD Family Medicine [phone] Jan 24, 2019 - Current      Collective Portal  This patient has registered at the [facility] Emergency Department   For more information visit: [email or web address]     PLEASE NOTE:     1.   Any care recommendations and other clinical information are provided as guidelines or for historical purposes only, and providers should exercise their own clinical judgment when providing care.    2.   You may only use this information for purposes of treatment, payment or health care operations activities, and subject to the limitations of applicable Collective Policies.    3.   You should consult directly with the organization that provided a care guideline or other clinical history with any questions about additional information or accuracy or completeness of information provided.    ? 2021 [place], [employer] - [email or web address]

## 2019-10-13 ENCOUNTER — Emergency Department
Admission: EM | Admit: 2019-10-13 | Discharge: 2019-10-13 | Disposition: A | Discharge status: Home / Self Care | Payer: Medicare Managed Care Other | Attending: General Practice | Admitting: General Practice

## 2019-10-13 ENCOUNTER — Ambulatory Visit: Payer: Medicare Managed Care Other | Attending: Nurse Practitioner | Admitting: Nurse Practitioner

## 2019-10-13 ENCOUNTER — Encounter (RURAL_HEALTH_CENTER): Payer: Self-pay | Admitting: Nurse Practitioner

## 2019-10-13 VITALS — BP 120/75 | HR 96 | Temp 96.1°F | Ht 65.0 in | Wt 144.0 lb

## 2019-10-13 DIAGNOSIS — Z5329 Procedure and treatment not carried out because of patient's decision for other reasons: Secondary | ICD-10-CM | POA: Insufficient documentation

## 2019-10-13 DIAGNOSIS — M75111 Incomplete rotator cuff tear or rupture of right shoulder, not specified as traumatic: Secondary | ICD-10-CM

## 2019-10-13 DIAGNOSIS — I82621 Acute embolism and thrombosis of deep veins of right upper extremity: Secondary | ICD-10-CM

## 2019-10-13 DIAGNOSIS — Z5320 Procedure and treatment not carried out because of patient's decision for unspecified reasons: Secondary | ICD-10-CM

## 2019-10-13 DIAGNOSIS — M542 Cervicalgia: Secondary | ICD-10-CM | POA: Insufficient documentation

## 2019-10-13 MED ORDER — LIDOCAINE 5 % EX PTCH
1.0000 | MEDICATED_PATCH | CUTANEOUS | 0 refills | Status: DC
Start: 2019-10-13 — End: 2019-11-19

## 2019-10-13 NOTE — ED Provider Notes (Signed)
History     Chief Complaint   Patient presents with    Deep Venous Thrombosis    Arm Pain    Neck Pain     HPI   34 year old female was brought to emergency room because of right-sided neck pain.  Patient was diagnosed with DVT right upper extremity about 4 days ago, she was put on Eliquis 10 mg twice a day for a week then 5 mg twice a day.  Patient states that she woke up this morning and having right-sided neck pain.  Patient has history of anxiety and depression, Crohn's disease, bipolar disorder, hepatitis C, COPD, fibromyalgia, GERD, insomnia.  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     No mechanical SBO - atonic bowel with 6 hour SB transit time; No COLON present    Fibromyalgia     Gastroesophageal reflux disease     Insomnia     Seasonal allergic rhinitis        Past Surgical History:   Procedure Laterality Date    APPENDECTOMY      CHOLECYSTECTOMY      Double Barrel Enterostomy  2013    for SBO    EGD, COLONOSCOPY N/A 08/19/2019    Procedure: EGD, COLONOSCOPY;  Surgeon: Gwenith Spitz, MD;  Location: [facility];  Service: Gastroenterology;  Laterality: N/A;    Enterostomy reversal  2014    double barrel small bowel ostomies reanastomosed    ILEOSTOMY  2011    for SBO after colectomy    ILEOSTOMY CLOSURE  2012    multiple times    LAPAROTOMY, COLECTOMY, TOTAL  2011    only a rectum remains    OVARIAN CYST SURGERY  2011-2016    several    SALPINGECTOMY, OPEN Right     2011       Family History   Problem Relation Age of Onset    Hypertension Mother     Diabetes Mother     Leukemia Father     Liver disease Maternal Grandfather     Hyperlipidemia Paternal Grandfather        Social  Social History     Tobacco Use     Smoking status: Current Every Day Smoker     Packs/day: 1.00     Years: 20.00     Pack years: 20.00     Types: Cigarettes    Smokeless tobacco: Never Used   Vaping Use    Vaping Use: Never used   Substance Use Topics    Alcohol use: Never    Drug use: Not Currently     Types: Heroin     Comment: opiates -- last use 2013       .     Allergies   Allergen Reactions    Fentanyl Anaphylaxis    Ketorolac Tromethamine Itching    Morphine Itching    Vancomycin Other (See Comments) and Edema     throat swelling and turns red     Latex Itching    Oxycodone Hives    Oxycodone-Acetaminophen Hives    Reglan [Metoclopramide] Hives    Rocephin [Ceftriaxone] Hives    Tramadol  Itching and Nausea And Vomiting    Walnuts [Tree Nuts] Other (See Comments)     diverticulitis        Home Medications             acetaminophen (TYLENOL) 325 MG tablet     Take 2 tablets (650 mg total) by mouth every 6 (six) hours as needed for Pain     albuterol sulfate HFA (PROVENTIL) 108 (90 Base) MCG/ACT inhaler     Inhale 2 puffs into the lungs every 4 (four) hours as needed for Wheezing or Shortness of Breath     albuterol sulfate HFA (PROVENTIL) 108 (90 Base) MCG/ACT inhaler     Inhale 1-2 puffs into the lungs every 4 (four) hours as needed for Wheezing or Shortness of Breath     Apixaban Starter Pack 5 MG Tablet Therapy Pack     Take 5 mg by mouth 2 (two) times daily Take 10 mg twice daily for 7 days followed by 5 mg twice daily.     benzonatate (TESSALON) 100 MG capsule     Take 1 capsule (100 mg total) by mouth 3 (three) times daily as needed for Cough     buprenorphine-naloxone (SUBOXONE) 8-2 MG Film     Place 1 tablet under the tongue 2 (two) times daily     clonazePAM (KlonoPIN) 0.25 MG disintegrating tablet     Take 0.25 mg by mouth as needed     diphenoxylate-atropine (LOMOTIL) 2.5-0.025 MG per tablet     TAKE 2 TABLETS BY MOUTH 4 TIMES DAILY     docusate sodium (COLACE) 100 MG capsule     Take 1 capsule (100 mg total) by mouth 2  (two) times daily as needed for Constipation     fluticasone-salmeterol (ADVAIR HFA) 230-21 MCG/ACT inhaler     Inhale 2 puffs into the lungs 2 (two) times daily     hydrOXYzine (ATARAX) 25 MG tablet     Take 50 mg by mouth     ibuprofen (ADVIL) 800 MG tablet     Take 1 tablet (800 mg total) by mouth every 8 (eight) hours as needed for Pain     lidocaine (LIDODERM) 5 %     Place 1 patch onto the skin every 24 hours Remove & Discard patch within 12 hours or as directed by MD     mirtazapine (REMERON) 15 MG tablet     Take 15 mg by mouth     naloxone (NARCAN) 4 MG/0.1ML nasal spray     1 spray intranasally. If pt does not respond or relapses into respiratory depression call 911. Give additional doses every 2-3 min.     ondansetron (ZOFRAN-ODT) 4 MG disintegrating tablet     Take 1 tablet (4 mg total) by mouth every 8 (eight) hours as needed for Nausea     pregabalin (LYRICA) 150 MG capsule     Take 150 mg by mouth 3 (three) times daily     promethazine (PHENERGAN) 12.5 MG tablet     Take 1 tablet (12.5 mg total) by mouth every 8 (eight) hours as needed for Nausea     tiZANidine (ZANAFLEX) 4 MG tablet     Take 1 tablet (4 mg total) by mouth every 8 (eight) hours as needed (muscle spasm)     topiramate (TOPAMAX) 25 MG tablet     Take 25 mg by mouth every 12 (twelve) hours        traZODone (DESYREL) 300 MG  tablet     Take 300 mg by mouth nightly     venlafaxine (EFFEXOR-XR) 150 MG 24 hr capsule     Take 150 mg by mouth daily           Review of Systems   Musculoskeletal: Positive for neck pain.   All other systems reviewed and are negative.      Physical Exam    BP: 118/74, Heart Rate: 88, Temp: 98.2 F (36.8 C), Resp Rate: 16, SpO2: 98 %, Weight: 64.4 kg    Physical Exam  Vitals and nursing note reviewed.   Constitutional:       General: She is not in acute distress.     Appearance: Normal appearance. She is normal weight. She is not ill-appearing, toxic-appearing or diaphoretic.   Neck:      Vascular: No carotid  bruit.      Comments: Tender right-sided neck and trapezius.  No deformity or edema.  Cardiovascular:      Rate and Rhythm: Normal rate and regular rhythm.      Pulses: Normal pulses.      Heart sounds: Normal heart sounds.   Pulmonary:      Effort: Pulmonary effort is normal. No respiratory distress.      Breath sounds: Normal breath sounds. No stridor. No wheezing, rhonchi or rales.   Chest:      Chest wall: No tenderness.   Musculoskeletal:      Cervical back: Normal range of motion and neck supple. Tenderness present. No rigidity.   Lymphadenopathy:      Cervical: No cervical adenopathy.   Neurological:      Mental Status: She is alert.           MDM and ED Course     ED Medication Orders (From admission, onward)    None             MDM  34 year old female came to emergency room complaining of pain right side of neck and shoulder.  Patient was diagnosed with DVT of right upper extremities 4 days ago, she has been on Eliquis since 4 days ago.  Differential diagnosis could be due to but not limited to muscle spasm, neck strain, herniated disc, extension of thrombosis,.  Wrote similar and was trying to discuss the case with the radiologist to do a sonogram or CT of the neck.  The nurse informed me that patient walked out of the emergency department.               Procedures    Clinical Impression & Disposition     Clinical Impression  Final diagnoses:   Patient left before treatment completed        ED Disposition     ED Disposition Condition Date/Time Comment    Left prior to treatment complete  Mon Oct 13, 2019 11:25 AM Patient left prior to treatment complete.           Discharge Medication List as of 10/13/2019 11:01 AM                    Rylie Knierim, Jean Rosenthal, MD  10/13/19 1126

## 2019-10-13 NOTE — EDIE (Signed)
COLLECTIVE?NOTIFICATION?10/13/2019 09:42?[name], [name]?MRN: [id number]    [facility]'s patient encounter information:   VWU:[id number]  Account [id number]  Billing Account [id number]      Criteria Met      High Utilization (6+ ED Visits/6 Mo.)    Narx Scores Alert    Security and Safety  No recent Security Events currently on file    ED Care Guidelines  There are currently no ED Care Guidelines for this patient. Please check your facility's medical records system.    Flags      Negative COVID-19 Lab Result - [facility] - A specimen collected from this patient was negative for COVID-19 / Attributed By: [place] Department of [facility] / Attributed On: 09/25/2019       Prescription Monitoring Program  852??- Narcotic Use Score  822??- Sedative Use Score  000??- Stimulant Use Score  700??- Overdose Risk Score  - All Scores range from 000-999 with 75% of the population scoring < 200 and on 1% scoring above 650  - The last digit of the narcotic, sedative, and stimulant score indicates the number of active prescriptions of that type  - Higher Use scores correlate with increased prescribers, pharmacies, mg equiv, and overlapping prescriptions  - Higher Overdose Risk Scores correlate with increased risk of unintentional overdose death   Concerning or unexpectedly high scores should prompt a review of the PMP record; this does not constitute checking PMP for prescribing purposes.      E.D. Visit Count (12 mo.)  Facility Visits   [facility] 10   [facility] 3   [facility] 3   Total 16   Note: Visits indicate total known visits.     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]      Opioid abuse, in remission      Pain in right arm      Oct 10, 2019 [name] [place] [facility]      Arm Pain and swelling      Arm Injury      Acute embolism and thrombosis of deep veins of right upper extremity      Sep 24, 2019 [name] [place] [facility]      Fever; Headache; Nausea      Viral infection, unspecified      Sep 13, 2019 [name] [place] [facility]      Short term memory loss, headache      Generalized Body Aches      Memory Loss      Weakness      Urinary tract infection, site not specified      May 24, 2019 [name] [place] [facility]      OverDose      Drug Overdose      Poisoning by unspecified drugs, medicaments and biological substances, undetermined, initial encounter      May 12, 2019 [name] [place] [facility]  SHOULDER INJURY      Shoulder Pain      Unspecified sprain of right shoulder joint, initial encounter      May 03, 2019 [facility] [place] [facility]      SBO      Abdominal Pain      Unspecified intestinal obstruction, unspecified as to partial versus complete obstruction      May 03, 2019 [name] [place] [facility]      vomiting, abdominal pain      Abdominal Pain      Emesis      Complete intestinal obstruction, unspecified as to cause      Vomiting of fecal matter      Mar 11, 2019 [facility] [place] [place] [facility]      abd pain      Unspecified intestinal obstruction, unspecified as to partial versus complete obstruction          Recent Inpatient Visit Summary  Date Facility [facility] Type Diagnoses or Chief Complaint   May 24, 2019 [facility] [place] [place] [facility]      Poisoning by unspecified drugs, medicaments and biological substances, accidental (unintentional), initial encounter      Fibromyalgia      May 03, 2019 [facility] [place]       Unspecified intestinal obstruction, unspecified as to partial versus complete obstruction      Other chronic pain      Unspecified abdominal pain      Other specified functional intestinal disorders      Generalized anxiety disorder      Fibromyalgia      Bipolar disorder, unspecified      Mar 11, 2019 [facility] [place] [place] [facility]      Unspecified intestinal obstruction, unspecified as to partial versus complete obstruction      Mar 08, 2019 [facility] [place] [place] [facility]      Unspecified intestinal obstruction, unspecified as to partial versus complete obstruction      Dec 05, 2018 [facility] [place] [place] [facility]      Unspecified intestinal obstruction, unspecified as to partial versus complete obstruction          Care Team  Provider Specialty Phone Fax Service Dates   Adline Mango, MD Family Medicine [phone] Jan 24, 2019 - Current      Collective Portal  This patient has registered at the [facility] Emergency Department   For more information visit: [email or web address]     PLEASE NOTE:     1.   Any care recommendations and other clinical information are provided as guidelines or for historical purposes only, and providers should exercise their own clinical judgment when providing care.    2.   You may only use this information for purposes of treatment, payment or health care operations activities, and subject to the limitations of applicable Collective Policies.    3.   You should consult directly with the organization that provided a care guideline or other clinical history with any questions about additional information or accuracy or completeness of information provided.    ? 2021 [place] - [email or web address]

## 2019-10-13 NOTE — ED Notes (Signed)
Patient called to nurse. Patient advised she was just going to leave.  Patient ambulated out without difficulty.  Dr. Radene Gunning notified.

## 2019-10-13 NOTE — Progress Notes (Signed)
e

## 2019-10-14 ENCOUNTER — Ambulatory Visit (RURAL_HEALTH_CENTER): Payer: Self-pay | Admitting: Nurse Practitioner

## 2019-10-15 ENCOUNTER — Telehealth (RURAL_HEALTH_CENTER): Payer: Self-pay

## 2019-10-15 NOTE — Telephone Encounter (Signed)
Pt called and would like the referral to Cardiology, Vascular, and lab work. Can you order. CNR

## 2019-10-16 ENCOUNTER — Emergency Department: Payer: Medicare Managed Care Other

## 2019-10-16 ENCOUNTER — Emergency Department
Admission: EM | Admit: 2019-10-16 | Discharge: 2019-10-16 | Disposition: A | Discharge status: Home / Self Care | Payer: Medicare Managed Care Other | Attending: Emergency Medicine | Admitting: Emergency Medicine

## 2019-10-16 DIAGNOSIS — M79601 Pain in right arm: Secondary | ICD-10-CM | POA: Insufficient documentation

## 2019-10-16 DIAGNOSIS — R0789 Other chest pain: Secondary | ICD-10-CM | POA: Insufficient documentation

## 2019-10-16 LAB — COMPREHENSIVE METABOLIC PANEL
ALT: 10 U/L (ref 0–55)
AST (SGOT): 12 U/L (ref 10–42)
Albumin/Globulin Ratio: 1.16 Ratio (ref 0.80–2.00)
Albumin: 3.6 gm/dL (ref 3.5–5.0)
Alkaline Phosphatase: 83 U/L (ref 40–145)
Anion Gap: 14.3 mMol/L (ref 7.0–18.0)
BUN / Creatinine Ratio: 11.3 Ratio (ref 10.0–30.0)
BUN: 9 mg/dL (ref 7–22)
Bilirubin, Total: 0.2 mg/dL (ref 0.1–1.2)
CO2: 24 mMol/L (ref 20.0–30.0)
Calcium: 9.5 mg/dL (ref 8.5–10.5)
Chloride: 106 mMol/L (ref 98–110)
Creatinine: 0.8 mg/dL (ref 0.60–1.20)
EGFR: 97 mL/min/{1.73_m2} (ref 60–150)
Globulin: 3.1 gm/dL (ref 2.0–4.0)
Glucose: 94 mg/dL (ref 71–99)
Osmolality Calculated: 278 mOsm/kg (ref 275–300)
Potassium: 4.3 mMol/L (ref 3.5–5.3)
Protein, Total: 6.7 gm/dL (ref 6.0–8.3)
Sodium: 140 mMol/L (ref 136–147)

## 2019-10-16 LAB — CBC AND DIFFERENTIAL
Basophils %: 1.1 % (ref 0.0–3.0)
Basophils Absolute: 0.1 10*3/uL (ref 0.0–0.3)
Eosinophils %: 2.4 % (ref 0.0–7.0)
Eosinophils Absolute: 0.3 10*3/uL (ref 0.0–0.8)
Hematocrit: 43.4 % (ref 36.0–48.0)
Hemoglobin: 14.1 gm/dL (ref 12.0–16.0)
Lymphocytes Absolute: 3 10*3/uL (ref 0.6–5.1)
Lymphocytes: 27.3 % (ref 15.0–46.0)
MCH: 32 pg (ref 28–35)
MCHC: 32 gm/dL (ref 31–36)
MCV: 100 fL (ref 80–100)
MPV: 8.4 fL (ref 6.0–10.0)
Monocytes Absolute: 1.3 10*3/uL (ref 0.1–1.7)
Monocytes: 11.2 % (ref 3.0–15.0)
Neutrophils %: 57.9 % (ref 42.0–78.0)
Neutrophils Absolute: 6.5 10*3/uL (ref 1.7–8.6)
PLT CT: 331 10*3/uL (ref 130–440)
RBC: 4.36 10*6/uL (ref 3.80–5.00)
RDW: 13.7 % (ref 10.5–14.5)
WBC: 11.1 10*3/uL — ABNORMAL HIGH (ref 4.0–11.0)

## 2019-10-16 LAB — TROPONIN I: Troponin I: 0 ng/mL (ref 0.00–0.02)

## 2019-10-16 MED ORDER — IOHEXOL 350 MG/ML IV SOLN
70.0000 mL | Freq: Once | INTRAVENOUS | Status: AC | PRN
Start: 2019-10-16 — End: 2019-10-16
  Administered 2019-10-16: 20:00:00 70 mL via INTRAVENOUS

## 2019-10-16 NOTE — ED Provider Notes (Signed)
[facility] EMERGENCY DEPARTMENT  [place], [place]       Voice recognition software was used to generate this report. Every attempt was made at correction, however, word substitution, grammatical, and transcription errors may occur.     Physician/Midlevel provider first contact with patient: 10/16/19 1755         FINAL DIAGNOSIS:  1. Atypical chest pain     2. Diffuse pain in right upper extremity           ASSESSMENT/DIFFERENTIAL DIAGNOSIS:  [name] is a 34 y.o. female with nonspecific extremity and chest pain. No evidence of ischemic, embolic, surgical or bacterial disease.  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  No additional medications necessary and I will not provide her narcotic pain medications.      The differential diagnoses that were considered in the evaluation of this patient include, but were not limited to, coronary syndrome, pneumonia, pneumonitis, pneumothorax, phlebitic syndrome, DVT, pulmonary embolism, secondary gain, among others. -     ED COURSE & MEDICAL DECISION MAKING:    The patient was very sleepy on initial evaluation but insist that she had not taken any sedating medications.  I advised her I cannot provide her any sedating medications, specifically narcotics.  Her work-up was unremarkable otherwise once complete.  D-dimer would be of no utility in this so she went straight to CT angiography to rule out pulmonary embolism and there was none.  No malignant rhythms.  She is comfortable with discharge home. -      Visit Vitals  BP 118/62   Pulse 86   Temp 97.4 F (36.3 C) (Tympanic)   Resp 16   Ht 1.651 m   Wt 65.3 kg   LMP 10/08/2019   SpO2 97%   BMI 23.96 kg/m     ED Course as of Oct 16 1999   Thu Oct 16, 2019   1957 Rechecked.  Advised of results.  She can be safely discharged.  Continue the treatment as before.    [KF]      ED Course User Index  [KF] Vida Rigger, MD      RISK ASSESSMENT: The patient's  records were checked through the [facility] (and other states where applicable and available), used in accordance with official notices posted in the Emergency Department and waiting room in accordance with law. This is to screen for misuse, overuse, doctor-shopping, and/or diversion. This is a patient safety issue.  The patient has received 82 controlled substances prescriptions from 12 providers utilizing 5 pharmacies including both narcotics and benzodiazepines at the same time.  The patient has a known and admitted opiate addiction problem.    RISK ASSESSMENT: The patient's records were checked through the Emergency Department Information Exchange in accordance with federal law. This demonstrates a frequency of ED visits at various hospitals, concerning chief complaints, number of prescriptions, providers and/or pharmacies that surpass a certain threshold. This is to assist in providing safe, rational care for persons who have triggered the EDIE through the system algorithm. This is a patient safety issue. Specifically, 17 visits to the emergency department at 3 institution(s) in the past year.      Patient's evaluation indicates the patient does not appear to have a need for inpatient workup and/or management. No further testing indicated acutely. Prior visits/ charts: reviewed. Nurses' notes reviewed. Pertinent lab and radiology data was reviewed by me and discussed with  patient and family, if present. I had a detailed discussion about the diagnosis, workup and treatment plan, and we are in agreement.    MEDICATIONS ORDERED THIS VISIT:     Medications   iohexol (OMNIPAQUE) 350 MG/ML injection 70 mL (70 mLs Intravenous Imaging Agent Given 10/16/19 1934)       PROCEDURES:   Procedures    CONSULTATION:   None     DISPOSITION:    Discharge from the Emergency Department.      New Prescriptions    No medications on file       CONDITION AT DISPOSITION: Good, unchanged. Stable.          HISTORIAN:  History obtained from the patient.    CHIEF COMPLAINT:  Chief Complaint   Patient presents with    Chest Pain    Arm Pain    Shortness of Breath    Fatigue    " My arm in my chest" according to patient    HISTORY OF PRESENT ILLNESS:    [name] is a very sleepy 34 y.o. female who complains of sharp pain in her right chest radiating down her right upper extremity.  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  She has been seen here multiple times in the past for various pain related syndromes and infectious processes.  She states that she has not been able to sleep because of the pain.  No specific provoking, aggravating, relieving factor.  She is very sleepy and dozes off during the examination.  She insists that she has not had any sedating medications, stating, "you can drug test to me if you want."  I told her I did not need to do that.   HPI -   .  REVIEW OF SYSTEMS:   Review of Systems. At least 10 systems reviewed and negative, unless otherwise specified above.    PAST MEDICAL/SURGICAL HISTORY:  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     No mechanical SBO - atonic bowel with 6 hour SB transit time; No COLON present    Fibromyalgia     Gastroesophageal reflux disease     Insomnia     Seasonal allergic rhinitis    .   Past Surgical History:   Procedure Laterality Date    APPENDECTOMY      CHOLECYSTECTOMY      Double Barrel Enterostomy  2013    for SBO    EGD, COLONOSCOPY N/A 08/19/2019    Procedure: EGD, COLONOSCOPY;  Surgeon: Gwenith Spitz, MD;  [facility];  Service: Gastroenterology;  Laterality: N/A;    Enterostomy reversal  2014    double barrel small bowel ostomies  reanastomosed    ILEOSTOMY  2011    for SBO after colectomy    ILEOSTOMY CLOSURE  2012    multiple times    LAPAROTOMY, COLECTOMY, TOTAL  2011    only a rectum remains    OVARIAN CYST SURGERY  2011-2016    several    SALPINGECTOMY, OPEN Right     2011   .     IMMUNIZATIONS:  Immunization History  Administered Date(s) Administered    INFLUENZA QUAD (FLULAVAL ONLY) 6 MOS & GREATER PRESERVED 11/24/2015    Influenza Vacc QUAD Recombinant PF 10yrs & up 11/15/2018    Influenza quadrivalent (IM) 6 months & up PRESERVED 11/24/2015    Influenza quadrivalent (IM) PF 3 Yrs & greater 11/16/2016      COVID19 Vaccine Status:   []  Unknown    []  Prior documented infection  [x]  None Partial Fully Booster   []  Pfizer/BioNtech []  []  []    []  Moderna []  []  []    []  Johnson&Johnson  []  []    []  Other []  []  []    []  Unknown which []  []  []        FAMILY HISTORY:  Family History   Problem Relation Age of Onset    Hypertension Mother     Diabetes Mother     Leukemia Father     Liver disease Maternal Grandfather     Hyperlipidemia Paternal Grandfather        HOME MEDICATIONS:  Home Medications             acetaminophen (TYLENOL) 325 MG tablet     Take 2 tablets (650 mg total) by mouth every 6 (six) hours as needed for Pain     albuterol sulfate HFA (PROVENTIL) 108 (90 Base) MCG/ACT inhaler     Inhale 2 puffs into the lungs every 4 (four) hours as needed for Wheezing or Shortness of Breath     Apixaban Starter Pack 5 MG Tablet Therapy Pack     Take 5 mg by mouth 2 (two) times daily Take 10 mg twice daily for 7 days followed by 5 mg twice daily.     benzonatate (TESSALON) 100 MG capsule     Take 1 capsule (100 mg total) by mouth 3 (three) times daily as needed for Cough     buprenorphine-naloxone (SUBOXONE) 8-2 MG Film     Place 1 tablet under the tongue 2 (two) times daily     clonazePAM (KlonoPIN) 0.25 MG disintegrating tablet     Take 0.25 mg by mouth as needed     diphenoxylate-atropine (LOMOTIL) 2.5-0.025 MG per tablet     TAKE  2 TABLETS BY MOUTH 4 TIMES DAILY     docusate sodium (COLACE) 100 MG capsule     Take 1 capsule (100 mg total) by mouth 2 (two) times daily as needed for Constipation     fluticasone-salmeterol (ADVAIR HFA) 230-21 MCG/ACT inhaler     Inhale 2 puffs into the lungs 2 (two) times daily     hydrOXYzine (ATARAX) 25 MG tablet     Take 50 mg by mouth     ibuprofen (ADVIL) 800 MG tablet     Take 1 tablet (800 mg total) by mouth every 8 (eight) hours as needed for Pain     lidocaine (LIDODERM) 5 %     Place 1 patch onto the skin every 24 hours Remove & Discard patch after 12 hours use     naloxone (NARCAN) 4 MG/0.1ML nasal spray     1 spray intranasally. If pt does not respond or relapses into respiratory depression call 911. Give additional doses every 2-3 min.     ondansetron (ZOFRAN-ODT) 4 MG disintegrating tablet     Take 1 tablet (4 mg total) by mouth every 8 (eight) hours as needed for Nausea     pregabalin (LYRICA) 150 MG capsule     Take 150 mg by mouth 3 (three) times daily  promethazine (PHENERGAN) 12.5 MG tablet     Take 1 tablet (12.5 mg total) by mouth every 8 (eight) hours as needed for Nausea     tiZANidine (ZANAFLEX) 4 MG tablet     Take 1 tablet (4 mg total) by mouth every 8 (eight) hours as needed (muscle spasm)     topiramate (TOPAMAX) 25 MG tablet     Take 25 mg by mouth every 12 (twelve) hours        traZODone (DESYREL) 300 MG tablet     Take 300 mg by mouth nightly     venlafaxine (EFFEXOR-XR) 150 MG 24 hr capsule     Take 150 mg by mouth daily           Flagged for Removal             albuterol sulfate HFA (PROVENTIL) 108 (90 Base) MCG/ACT inhaler     Inhale 1-2 puffs into the lungs every 4 (four) hours as needed for Wheezing or Shortness of Breath     mirtazapine (REMERON) 15 MG tablet     Take 15 mg by mouth           ALLERGY:  Allergies   Allergen Reactions    Fentanyl Anaphylaxis    Ketorolac Tromethamine Itching    Morphine Itching    Vancomycin Other (See Comments) and Edema     throat  swelling and turns red     Latex Itching    Oxycodone Hives    Oxycodone-Acetaminophen Hives    Reglan [Metoclopramide] Hives    Rocephin [Ceftriaxone] Hives    Tramadol Itching and Nausea And Vomiting    Walnuts [Tree Nuts] Other (See Comments)     diverticulitis        SOCIAL HISTORY:   Social History     Tobacco Use    Smoking status: Current Every Day Smoker     Packs/day: 1.00     Years: 20.00     Pack years: 20.00     Types: Cigarettes    Smokeless tobacco: Never Used   Vaping Use    Vaping Use: Never used   Substance Use Topics    Alcohol use: Never    Drug use: Not Currently     Types: Heroin     Comment: opiates -- last use 2013       NOTE: If the PMH or PSH or social history sections read "Not on file" or are left blank it reflects that the histories were verbally investigated but there is no past medical or surgical history, or tobacco, alcohol or recreational drug use except as noted.    PHYSICAL EXAMINATION:  VITAL SIGNS reviewed by me:   Vitals:    10/16/19 1923   BP:    Pulse: 86   Resp: 16   Temp:    SpO2: 97%      Reviewed by the Emergency Physician  Pulse Ox Interpretation by Emergency Physician: normal   WEIGHT:   Weight Monitoring 10/13/2019 10/13/2019 10/16/2019   Height 165.1 cm 165.1 cm 165.1 cm   Height Method Stated - Stated   Weight 64.411 kg 65.318 kg 65.318 kg   Weight Method Stated - Stated   BMI (calculated) 23.7 kg/m2 24 kg/m2 24 kg/m2       CONSTITUTIONAL: Vital signs reviewed. Patient is chronically unwell-appearing. Patient appears very sleepy. Hydration: normal. Not toxic appearing.   HEAD: Atraumatic. Normocephalic.   EYES: Eyes are normal to inspection. Pupils equal, round,  reactive to light. Discharge from eyes: none. Extraocular muscles: intact.   ENT: No facial swelling. External ears normal. No discharge from ears.   NECK: Range of motion: normal. Tenderness: none. Trachea midline. Jugular venous distention: none.   CARDIOVASCULAR: Rate: normal. Rhythm: Regular.  Murmurs: none. Normal S1 S2.   RESPIRATORY/CHEST: Chest tenderness: Right anterior. Breath sounds: normal. Respiratory distress: none. Accessory muscle use: none. Work of breathing: normal.   BACK: Tenderness: none. Range of motion: normal for age. Visible abnormality: none. Palpable abnormality: none.   ABDOMEN: Soft. Distention: none. Tenderness: none. No guarding. No rebound. Bowel sounds: normal.   RECTAL: Not performed.   GENITALIA: Not examined.   UPPER EXTREMITY: Diffuse tenderness of the right upper extremity.  Distal pulses are full. No edema. No cyanosis. No clubbing. Normal range of motion. Open wound: none.   LOWER EXTREMITY: No edema. No cyanosis. No clubbing. Normal range of motion. No calf tenderness. Open wound: none.   NEUROLOGIC:  Awake. Alert. Strength: normal. Sensation: normal. Cranial nerves: intact. Conversation: fluent.  SKIN: Skin is normal color, warm, and dry. Skin with multiple excoriations and scars. Rash: None.   LYMPHATIC: No pathologic adenopathy. No lymphangitis.   PSYCHIATRIC: Affect: Flat. Insight: Seems fairly normal.  Physical Exam    DATA REVIEWED:   LABS:      Results     Procedure Component Value Units Date/Time    Troponin I [[id number]] Collected: 10/16/19 1845    Specimen: Plasma Updated: 10/16/19 1936     Troponin I 0.00 ng/mL     Comprehensive metabolic panel [[id number]] Collected: 10/16/19 1845    Specimen: Plasma Updated: 10/16/19 1930     Sodium 140 mMol/L      Potassium 4.3 mMol/L      Chloride 106 mMol/L      CO2 24.0 mMol/L      Calcium 9.5 mg/dL      Glucose 94 mg/dL      Creatinine 7.82 mg/dL      BUN 9 mg/dL      Protein, Total 6.7 gm/dL      Albumin 3.6 gm/dL      Alkaline Phosphatase 83 U/L      ALT 10 U/L      AST (SGOT) 12 U/L      Bilirubin, Total 0.2 mg/dL      Albumin/Globulin Ratio 1.16 Ratio      Anion Gap 14.3 mMol/L      BUN / Creatinine Ratio 11.3 Ratio      EGFR 97 mL/min/1.58m2      Osmolality Calculated 278 mOsm/kg      Globulin 3.1 gm/dL     CBC  and differential [[id number]]  (Abnormal) Collected: 10/16/19 1845    Specimen: Blood Updated: 10/16/19 1913     WBC 11.1 K/cmm      RBC 4.36 M/cmm      Hemoglobin 14.1 gm/dL      Hematocrit 57.8 %      MCV 100 fL      MCH 32 pg      MCHC 32 gm/dL      RDW 46.9 %      PLT CT 331 K/cmm      MPV 8.4 fL      Neutrophils % 57.9 %      Lymphocytes 27.3 %      Monocytes 11.2 %      Eosinophils % 2.4 %      Basophils % 1.1 %  Neutrophils Absolute 6.5 K/cmm      Lymphocytes Absolute 3.0 K/cmm      Monocytes Absolute 1.3 K/cmm      Eosinophils Absolute 0.3 K/cmm      Basophils Absolute 0.1 K/cmm            Reviewed and interpreted by the Emergency Physician     RADIOLOGY:   CT ANGIOGRAM CHEST    Result Date: 10/16/2019  1. Negative for PE. 2. No acute airspace disease. ReadingStation:[id number]    XR Chest AP Portable    Result Date: 10/16/2019  No acute process. ReadingStation:[id number]      Reviewed and interpreted by the Emergency Physician     EKG:  Last EKG Result     Procedure Component Value Units Date/Time    ECG 12 lead [[id number]] Collected: 10/16/19 1823     Updated: 10/16/19 1827     Patient Age 2 years      Patient DOB 1985-08-08     Patient Height --     Patient Weight --     Interpretation Text --     Sinus rhythm  Compared to ECG 05/24/2019 14:01:01  No significant changes       Physician Interpreter --     Ventricular Rate 71 //min      QRS Duration 87 ms      P-R Interval 143 ms      Q-T Interval 373 ms      Q-T Interval(Corrected) 406 ms      P Wave Axis 16 deg      QRS Axis 75 deg      T Axis 60 years           Agree with above interpretation.   Interpreted by the Emergency Physician.      CARDIAC RHYTHM INTERPRETATION from monitor: Sinus rhythm   Interpreted by the Emergency Physician     Vida Rigger, MD  10/16/19 2001

## 2019-10-16 NOTE — ED Notes (Signed)
Reort given to Bev RN  Two unsuccessful attempts at IV access by this RN  Bev is now attempting IV

## 2019-10-16 NOTE — EDIE (Signed)
[name], [name]?MRN: [id number]    [facility]'s patient encounter information:   JXB:?[id number]  Account [id number]  Billing Account [id number]      Criteria Met      High Utilization (6+ ED Visits/6 Mo.)    Security and Safety  No recent Security Events currently on file    ED Care Guidelines  There are currently no ED Care Guidelines for this patient. Please check your facility's medical records system.    Flags      Negative COVID-19 Lab Result - [facility] - A specimen collected from this patient was negative for COVID-19 / Attributed By: [place] Department of Health / Attributed On: 09/25/2019       Prescription Monitoring Program  000??- Narcotic Use Score  000??- Sedative Use Score  000??- Stimulant Use Score  000??- Overdose Risk Score  - All Scores range from 000-999 with 75% of the population scoring < 200 and on 1% scoring above 650  - The last digit of the narcotic, sedative, and stimulant score indicates the number of active prescriptions of that type  - Higher Use scores correlate with increased prescribers, pharmacies, mg equiv, and overlapping prescriptions  - Higher Overdose Risk Scores correlate with increased risk of unintentional overdose death   Concerning or unexpectedly high scores should prompt a review of the PMP record; this does not constitute checking PMP for prescribing purposes.      E.D. Visit Count (12 mo.)  Facility Visits   [facility] [address] - [facility] 3   [facility] - [facility] 3   Total 17   Note: Visits indicate total known visits.     Recent Emergency Department Visit Summary  Showing 10 most recent visits out of 17 in the past 12 months  Date Facility [facility] Type Diagnoses or Chief Complaint   Oct 16, 2019 [name] [place] [facility]      chest pain      Oct 13, 2019 [name] [place] [facility]      neck  pain      Arm Pain      Deep Venous Thrombosis      Patient's noncompliance with other medical treatment and regimen      Oct 10, 2019 [name]  [place] [facility]      Arm pain      Malingerer [conscious simulation]      Opioid abuse, in remission      Pain in right arm      Oct 10, 2019 [name] [place] [facility]      Arm Pain and swelling      Arm Injury      Acute embolism and thrombosis of deep veins of right upper extremity      Sep 24, 2019 [name] [place] [facility]      Fever; Headache; Nausea      Viral infection, unspecified      Sep 13, 2019 [name] [place] [facility]      Short term memory loss, headache      Generalized Body Aches      Memory Loss      Weakness      Urinary tract infection, site not specified      May 24, 2019 [name] [place] [facility]  OverDose      Drug Overdose      Poisoning by unspecified drugs, medicaments and biological substances, undetermined, initial encounter      May 12, 2019 [name] [place] [facility]      SHOULDER INJURY      Shoulder Pain      Unspecified sprain of right shoulder joint, initial encounter      May 03, 2019 [facility] [place] [place] [facility]      SBO      Abdominal Pain      Unspecified intestinal obstruction, unspecified as to partial versus complete obstruction      May 03, 2019 [name] [place] [facility]      vomiting, abdominal pain      Abdominal Pain      Emesis      Complete intestinal obstruction, unspecified as to cause      Vomiting of fecal matter          Recent Inpatient Visit Summary  Date Facility [facility] Type Diagnoses or Chief Complaint   May 24, 2019 [facility] [place] [place] [facility]      Poisoning by unspecified drugs, medicaments and biological substances, accidental (unintentional), initial encounter      Fibromyalgia      May 03, 2019 [facility] [place] [place] [facility]       Unspecified intestinal obstruction, unspecified as to partial versus complete obstruction      Other chronic pain      Unspecified abdominal pain      Other specified functional intestinal disorders      Generalized anxiety disorder      Fibromyalgia      Bipolar disorder, unspecified      Mar 11, 2019 [facility] [place] [place]      Unspecified intestinal obstruction, unspecified as to partial versus complete obstruction      Mar 08, 2019 [facility] [place] [place] [facility]      Unspecified intestinal obstruction, unspecified as to partial versus complete obstruction      Dec 05, 2018 [facility] [place]  [facility]      Unspecified intestinal obstruction, unspecified as to partial versus complete obstruction          Care Team  Provider Specialty Phone Fax Service Dates   Adline Mango, MD Family Medicine [phone] Jan 24, 2019 - Current      Collective Portal  This patient has registered at the [facility] Emergency Department   For more information visit: [email or web address]     PLEASE NOTE:     1.   Any care recommendations and other clinical information are provided as guidelines or for historical purposes only, and providers should exercise their own clinical judgment when providing care.    2.   You may only use this information for purposes of treatment, payment or health care operations activities, and subject to the limitations of applicable Collective Policies.    3.   You should consult directly with the organization that provided a care guideline or other clinical history with any questions about additional information or accuracy or completeness of information provided.    ? 2021 [place], [employer]. - [email or web address]

## 2019-10-17 ENCOUNTER — Other Ambulatory Visit (RURAL_HEALTH_CENTER): Payer: Self-pay | Admitting: Nurse Practitioner

## 2019-10-17 DIAGNOSIS — I82621 Acute embolism and thrombosis of deep veins of right upper extremity: Secondary | ICD-10-CM

## 2019-10-19 LAB — ECG 12-LEAD
P Wave Axis: 16 deg
P-R Interval: 143 ms
Patient Age: 34 years
Q-T Interval(Corrected): 406 ms
Q-T Interval: 373 ms
QRS Axis: 75 deg
QRS Duration: 87 ms
T Axis: 60 years
Ventricular Rate: 71 //min

## 2019-10-27 ENCOUNTER — Emergency Department
Admission: EM | Admit: 2019-10-27 | Discharge: 2019-10-27 | Discharge status: Against Medical Advice | Payer: Medicare Managed Care Other | Attending: Family Medicine | Admitting: Family Medicine

## 2019-10-27 ENCOUNTER — Emergency Department: Payer: Medicare Managed Care Other

## 2019-10-27 DIAGNOSIS — I82621 Acute embolism and thrombosis of deep veins of right upper extremity: Secondary | ICD-10-CM | POA: Insufficient documentation

## 2019-10-27 DIAGNOSIS — Z5321 Procedure and treatment not carried out due to patient leaving prior to being seen by health care provider: Secondary | ICD-10-CM | POA: Insufficient documentation

## 2019-10-27 DIAGNOSIS — Z765 Malingerer [conscious simulation]: Secondary | ICD-10-CM | POA: Insufficient documentation

## 2019-10-27 DIAGNOSIS — Z20822 Contact with and (suspected) exposure to covid-19: Secondary | ICD-10-CM | POA: Insufficient documentation

## 2019-10-27 LAB — VH XPERT XPRESS(C) SARS-COV-2 QUALITATIVE PCR
Does patient have symptoms related to condition of interest?: NEGATIVE
Does patient reside in a congregate care setting?: NEGATIVE
Is patient employed in a healthcare setting?: NEGATIVE
Is the patient hospitalized because of this condition?: NEGATIVE
SARS CoV-2 by PCR: NEGATIVE

## 2019-10-27 NOTE — ED Notes (Signed)
Patient complained of chest discomfort,EKG was done.

## 2019-10-27 NOTE — ED Notes (Signed)
Exam by MD,CT angio ordered. Stuck patient twice with #22,unable to obtain IV access.Patient has scar tissue. Patient was angry. She said a few curse words and walked out.

## 2019-10-27 NOTE — ED Triage Notes (Signed)
Severe right arm pain,has an embolus in that arm. Also having body aches,sore throat.

## 2019-10-27 NOTE — ED Notes (Signed)
Pt rang call light, in to assess. Pt found to be tearful stating "I'm frustrated my arm hurts my chest hurts and no one will listen to me." This RN attempted therapeutic communication with pt who is agreeable to having ECG done due to chest pain.

## 2019-10-27 NOTE — ED Notes (Signed)
MD made aware of pt c/o chest pain and ECG was given to MD

## 2019-10-27 NOTE — ED Notes (Signed)
This RN & security witnessed patient stomp her feet and punch the walls with her right arm while exiting the building.  Making multiple statements that "this place f**king sucks" and "get the f**k out of my way".  Other RNs made effort to console patient without success.

## 2019-10-27 NOTE — EDIE (Signed)
COLLECTIVE?NOTIFICATION?10/27/2019 20:13?[name], [name]?MRN: [id number]    [facility] [place] [facility]'s patient encounter information:   GUY:?[id number]  Account [id number]  Billing Account [id number]      Criteria Met      Narx Scores Alert    High Utilization (6+ ED Visits/6 Mo.)    Security and Safety  No recent Security Events currently on file    ED Care Guidelines  There are currently no ED Care Guidelines for this patient. Please check your facility's medical records system.    Flags      Negative COVID-19 Lab Result - [facility] - A specimen collected from this patient was negative for COVID-19 / Attributed By: [place] Department of Health / Attributed On: 09/25/2019       Prescription Monitoring Program  840??- Narcotic Use Score  822??- Sedative Use Score  000??- Stimulant Use Score  690??- Overdose Risk Score  - All Scores range from 000-999 with 75% of the population scoring < 200 and on 1% scoring above 650  - The last digit of the narcotic, sedative, and stimulant score indicates the number of active prescriptions of that type  - Higher Use scores correlate with increased prescribers, pharmacies, mg equiv, and overlapping prescriptions  - Higher Overdose Risk Scores correlate with increased risk of unintentional overdose death   Concerning or unexpectedly high scores should prompt a review of the PMP record; this does not constitute checking PMP for prescribing purposes.      E.D. Visit Count (12 mo.)  Facility Visits   [facility] [address] - [facility] 3   [facility] - [facility] 2   Total 17   Note: Visits indicate total known visits.     Recent Emergency Department Visit Summary  Showing 10 most recent visits out of 17 in the past 12 months  Date Facility [facility] Type Diagnoses or Chief Complaint   Oct 27, 2019 [name]      SOB/COVID SYMPTOMS      Oct 16, 2019 [name]      chest pain      Arm Pain      Fatigue      Shortness of Breath      Pain in right arm      Other chest pain      Oct 13, 2019 [name] [place] [facility]      neck pain      Arm Pain      Deep Venous Thrombosis      Patient's noncompliance with other medical treatment and regimen      Oct 10, 2019 [name] [place] [facility]      Arm pain      Malingerer [conscious simulation]      Opioid abuse, in remission      Pain in right arm      Oct 10, 2019 [name] [place] [facility]      Arm Pain and swelling      Arm Injury      Acute embolism and thrombosis of deep veins of right upper extremity      Sep 24, 2019 [name] [place] [facility]      Fever; Headache; Nausea      Viral infection, unspecified      Sep 13, 2019 [name] [place] [facility]      Short term memory loss, headache      Generalized Body Aches      Memory Loss      Weakness      Urinary tract infection, site not specified      May 24, 2019 [name] [place] [facility]      OverDose      Drug Overdose      Poisoning by unspecified drugs, medicaments and biological substances, undetermined, initial encounter      May 12, 2019 [name] [place] [facility]      SHOULDER INJURY      Shoulder Pain      Unspecified sprain of right shoulder joint, initial encounter      May 03, 2019 [facility] [place] [place] [facility]      SBO      Abdominal Pain      Unspecified intestinal obstruction, unspecified as to partial versus complete obstruction          Recent Inpatient Visit Summary  Date Facility [facility] Type Diagnoses or Chief Complaint   May 24, 2019 [facility] [place] [place] [facility]      Poisoning by unspecified drugs, medicaments and biological substances, accidental (unintentional), initial encounter      Fibromyalgia      May 03, 2019 [facility] [place] [place] [facility]       Unspecified intestinal obstruction, unspecified as to partial versus complete obstruction      Other chronic pain      Unspecified abdominal pain      Other specified functional intestinal disorders      Generalized anxiety disorder      Fibromyalgia      Bipolar disorder, unspecified      Mar 11, 2019 [facility] [place] [place] [facility]      Unspecified intestinal obstruction, unspecified as to partial versus complete obstruction      Mar 08, 2019 [facility] [place] [place] [facility]      Unspecified intestinal obstruction, unspecified as to partial versus complete obstruction      Dec 05, 2018 [facility] [place] [facility]      Unspecified intestinal obstruction, unspecified as to partial versus complete obstruction          Care Team  Provider Specialty Phone Fax Service Dates   Adline Mango, MD Family Medicine [phone] Jan 24, 2019 - Current      Collective Portal  This patient has registered at the [facility] Emergency Department   For more information visit: [email or web address]     PLEASE NOTE:     1.   Any care recommendations and other clinical information are provided as guidelines or for historical purposes only, and providers should exercise their own clinical judgment when providing care.    2.   You may only use this information for purposes of treatment, payment or health care operations activities, and subject to the limitations of applicable Collective Policies.    3.   You should consult directly with the organization that provided a care guideline or other clinical history with any questions about additional information or accuracy or completeness of information provided.    ? 2021 [place] - [email or web address]

## 2019-10-27 NOTE — ED Notes (Signed)
Patient came in with possible covid symptoms and painful right arm,she said she needed something for pain . Tested for possible covid. Asked for pain meds and explained that MD would be in to examine her,that RN is unable to give her pain meds until MD examines and possibly orders them. Patient upset.

## 2019-10-28 LAB — ECG 12-LEAD
P Wave Axis: 78 deg
P-R Interval: 125 ms
Patient Age: 34 years
Q-T Interval(Corrected): 428 ms
Q-T Interval: 366 ms
QRS Axis: 85 deg
QRS Duration: 88 ms
T Axis: 56 years
Ventricular Rate: 82 //min

## 2019-10-28 NOTE — ED Provider Notes (Signed)
History     Chief Complaint   Patient presents with    Arm Pain     right arm pain,has embolus in arm,taking eliquis    Headache    Fever    Sore Throat     HPI   Presents with complaints of worsening RUE pain radiating to her right anterior chest. On 10/10/2019 she was diagnosed with an unprovoked RUE DVT confined to her brachial vein without central venous thrombus, and she was prescribed Eliquis 10 mg BID x 7 days followed by 5 mg BID. She reports compliance with taking this exactly as prescribed. She came back to the ER on the same day requesting pain meds and then returned again on 9/20 with neck pain but she eloped before her evaluation was completed. She came back to the ER on 9/23 with worsening RUE pain radiating to her right chest and a CTA ruled out a PE. She states today that her RUE and right anterior chest pain have worsened. She denies shortness of breath or cough. She endorses a dull generalized headache, subjective fever, and a sore throat.    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     No mechanical SBO - atonic bowel with 6 hour SB transit time; No COLON present    Fibromyalgia     Gastroesophageal reflux disease     Insomnia     Seasonal allergic rhinitis        Past Surgical History:   Procedure Laterality Date    APPENDECTOMY      CHOLECYSTECTOMY      Double Barrel Enterostomy  2013    for SBO    EGD, COLONOSCOPY N/A 08/19/2019    Procedure: EGD, COLONOSCOPY;  Surgeon: Gwenith Spitz, MD;  [facility];  Service: Gastroenterology;  Laterality: N/A;    Enterostomy reversal  2014    double barrel small bowel ostomies reanastomosed    ILEOSTOMY  2011    for SBO after colectomy    ILEOSTOMY CLOSURE  2012     multiple times    LAPAROTOMY, COLECTOMY, TOTAL  2011    only a rectum remains    OVARIAN CYST SURGERY  2011-2016    several    SALPINGECTOMY, OPEN Right     2011       Family History   Problem Relation Age of Onset    Hypertension Mother     Diabetes Mother     Leukemia Father     Liver disease Maternal Grandfather     Hyperlipidemia Paternal Grandfather        Social  Social History     Tobacco Use    Smoking status: Current Every Day Smoker     Packs/day: 1.00     Years: 20.00     Pack years: 20.00     Types: Cigarettes    Smokeless tobacco: Never Used   Vaping Use    Vaping Use: Never used   Substance Use Topics    Alcohol use: Never    Drug use: Not Currently     Types: Heroin     Comment: opiates -- last use 2013       .  Allergies   Allergen Reactions    Fentanyl Anaphylaxis    Ketorolac Tromethamine Itching    Morphine Itching    Vancomycin Other (See Comments) and Edema     throat swelling and turns red     Latex Itching    Oxycodone Hives    Oxycodone-Acetaminophen Hives    Reglan [Metoclopramide] Hives    Rocephin [Ceftriaxone] Hives    Tramadol Itching and Nausea And Vomiting    Walnuts [Tree Nuts] Other (See Comments)     diverticulitis        Home Medications     Med List Status: In Progress Set By: Lorane Gell, RN at 10/27/2019  8:41 PM                acetaminophen (TYLENOL) 325 MG tablet     Take 2 tablets (650 mg total) by mouth every 6 (six) hours as needed for Pain     albuterol sulfate HFA (PROVENTIL) 108 (90 Base) MCG/ACT inhaler     Inhale 2 puffs into the lungs every 4 (four) hours as needed for Wheezing or Shortness of Breath     albuterol sulfate HFA (PROVENTIL) 108 (90 Base) MCG/ACT inhaler     Inhale 1-2 puffs into the lungs every 4 (four) hours as needed for Wheezing or Shortness of Breath     Apixaban Starter Pack 5 MG Tablet Therapy Pack     Take 5 mg by mouth 2 (two) times daily Take 10 mg twice daily for 7 days followed by 5 mg twice daily.      benzonatate (TESSALON) 100 MG capsule     Take 1 capsule (100 mg total) by mouth 3 (three) times daily as needed for Cough     buprenorphine-naloxone (SUBOXONE) 8-2 MG Film     Place 1 tablet under the tongue 2 (two) times daily     clonazePAM (KlonoPIN) 0.25 MG disintegrating tablet     Take 0.25 mg by mouth as needed     diphenoxylate-atropine (LOMOTIL) 2.5-0.025 MG per tablet     TAKE 2 TABLETS BY MOUTH 4 TIMES DAILY     docusate sodium (COLACE) 100 MG capsule     Take 1 capsule (100 mg total) by mouth 2 (two) times daily as needed for Constipation     fluticasone-salmeterol (ADVAIR HFA) 230-21 MCG/ACT inhaler     Inhale 2 puffs into the lungs 2 (two) times daily     lidocaine (LIDODERM) 5 %     Place 1 patch onto the skin every 24 hours Remove & Discard patch after 12 hours use     naloxone (NARCAN) 4 MG/0.1ML nasal spray     1 spray intranasally. If pt does not respond or relapses into respiratory depression call 911. Give additional doses every 2-3 min.     pregabalin (LYRICA) 150 MG capsule     Take 150 mg by mouth 3 (three) times daily     tiZANidine (ZANAFLEX) 4 MG tablet     Take 1 tablet (4 mg total) by mouth every 8 (eight) hours as needed (muscle spasm)     topiramate (TOPAMAX) 25 MG tablet     Take 25 mg by mouth every 12 (twelve) hours        traZODone (DESYREL) 300 MG tablet     Take 300 mg by mouth nightly     venlafaxine (EFFEXOR-XR) 150 MG 24 hr capsule     Take 150 mg by mouth daily  Flagged for Removal             hydrOXYzine (ATARAX) 25 MG tablet     Take 50 mg by mouth     mirtazapine (REMERON) 15 MG tablet     Take 15 mg by mouth     ondansetron (ZOFRAN-ODT) 4 MG disintegrating tablet     Take 1 tablet (4 mg total) by mouth every 8 (eight) hours as needed for Nausea     promethazine (PHENERGAN) 12.5 MG tablet     Take 1 tablet (12.5 mg total) by mouth every 8 (eight) hours as needed for Nausea           Review of Systems   Constitutional: Positive for chills and fever (   Subjective).   HENT: Positive for sore throat.    Respiratory: Negative for cough and shortness of breath.    Cardiovascular: Positive for chest pain ( Right anterior dull constant chest pain without waxing or waning). Negative for palpitations.   Musculoskeletal: Negative for neck pain and neck stiffness.   Skin: Negative for rash.   Neurological: Positive for headaches ( Dull and generalized).       Physical Exam    BP: 123/90, Heart Rate: 93, Temp: 99.2 F (37.3 C), Resp Rate: 18, SpO2: 98 %, Weight: 64.4 kg    Physical Exam  Vitals and nursing note reviewed.   Constitutional:       General: She is in acute distress ( Tearful).   HENT:      Head: Normocephalic and atraumatic.      Right Ear: External ear normal.      Left Ear: External ear normal.      Nose: Nose normal.      Mouth/Throat:      Mouth: Mucous membranes are moist.      Pharynx: Oropharynx is clear.   Eyes:      General: No scleral icterus.     Conjunctiva/sclera: Conjunctivae normal.   Neck:      Vascular: No JVD.   Cardiovascular:      Rate and Rhythm: Normal rate.      Heart sounds: S1 normal and S2 normal.   Pulmonary:      Effort: Pulmonary effort is normal.      Breath sounds: Normal breath sounds.   Chest:      Chest wall: No deformity, tenderness or crepitus.   Musculoskeletal:        Arms:       Cervical back: Neck supple. No rigidity.   Neurological:      Mental Status: She is alert.   Psychiatric:         Mood and Affect: Affect is labile and angry.         Behavior: Behavior is uncooperative.       Results     Procedure Component Value Units Date/Time    Xpert Xpress(R) SARS-CoV-2 Qualitative PCR [[id number]] Collected: 10/27/19 2045    Specimen: Nasopharyngeal Swab Updated: 10/27/19 2151     SARS CoV-2 by PCR Negative     Does patient have symptoms related to condition of interest? N     Is the patient hospitalized because of this condition? N     Is patient employed in a healthcare setting? N     Does patient reside in a congregate  care setting? N     Is the patient pregnant? UNK    Narrative:      Specimen source -  Nasopharyngeal Swab               MDM and ED Course     ED Medication Orders (From admission, onward)    None             MDM  Number of Diagnoses or Management Options  Acute deep vein thrombosis (DVT) of brachial vein of right upper extremity  Drug-seeking behavior  Eloped from emergency department  Diagnosis management comments: Patient eloped from the ER in a theatrical manner when I did not prescribe opioids for her pain. She did not stay for the CTA that I ordered. It was reported to me that she was seen punching the wall with her RUE although she told me that her RUE hurt so badly she couldn't move it. She was cursing and verbally abusive as she left the department.    Risk of Complications, Morbidity, and/or Mortality  Presenting problems: low  Diagnostic procedures: low  Management options: low                     Procedures    Clinical Impression & Disposition     Clinical Impression  Final diagnoses:   Eloped from emergency department   Acute deep vein thrombosis (DVT) of brachial vein of right upper extremity   Drug-seeking behavior        ED Disposition     ED Disposition Condition Date/Time Comment    Eloped  Mon Oct 27, 2019 11:22 PM            Discharge Medication List as of 10/27/2019 11:22 PM                    Elenor Quinones, MD  10/28/19 [phone]

## 2019-11-06 LAB — ECG 12-LEAD
P Wave Axis: 69 deg
P-R Interval: 151 ms
Patient Age: 34 years
Q-T Interval(Corrected): 422 ms
Q-T Interval: 415 ms
QRS Axis: 35 deg
QRS Duration: 102 ms
T Axis: 43 years
Ventricular Rate: 62 //min

## 2019-11-07 ENCOUNTER — Other Ambulatory Visit (RURAL_HEALTH_CENTER): Payer: Self-pay | Admitting: Nurse Practitioner

## 2019-11-10 ENCOUNTER — Telehealth (RURAL_HEALTH_CENTER): Payer: Self-pay | Admitting: Nurse Practitioner

## 2019-11-10 ENCOUNTER — Ambulatory Visit: Payer: Medicare Managed Care Other | Attending: Nurse Practitioner | Admitting: Nurse Practitioner

## 2019-11-10 ENCOUNTER — Ambulatory Visit (RURAL_HEALTH_CENTER): Payer: Self-pay | Admitting: Nurse Practitioner

## 2019-11-10 ENCOUNTER — Other Ambulatory Visit (RURAL_HEALTH_CENTER): Payer: Self-pay

## 2019-11-10 ENCOUNTER — Encounter (RURAL_HEALTH_CENTER): Payer: Self-pay | Admitting: Nurse Practitioner

## 2019-11-10 VITALS — BP 120/68 | HR 100 | Temp 98.5°F | Resp 18 | Ht 65.0 in | Wt 139.2 lb

## 2019-11-10 DIAGNOSIS — R110 Nausea: Secondary | ICD-10-CM

## 2019-11-10 DIAGNOSIS — I8290 Acute embolism and thrombosis of unspecified vein: Secondary | ICD-10-CM

## 2019-11-10 DIAGNOSIS — Z23 Encounter for immunization: Secondary | ICD-10-CM

## 2019-11-10 MED ORDER — APIXABAN 5 MG PO TABS
5.0000 mg | ORAL_TABLET | Freq: Two times a day (BID) | ORAL | 5 refills | Status: DC
Start: 2019-11-10 — End: 2019-12-24

## 2019-11-10 MED ORDER — PROMETHAZINE HCL 12.5 MG PO TABS
12.5000 mg | ORAL_TABLET | Freq: Three times a day (TID) | ORAL | 0 refills | Status: DC | PRN
Start: 2019-11-10 — End: 2020-02-17

## 2019-11-10 NOTE — Telephone Encounter (Signed)
11/10/2019 - patient came to check out stated she forgot to mention pain management to you. She would like to know who she could see for pain management.

## 2019-11-11 NOTE — Telephone Encounter (Signed)
She is already in a program for pain management. Did she stop seeing them? If she has there is [facility] in [place] that she could contact to establish.     Kathi Simpers, NP

## 2019-11-12 ENCOUNTER — Ambulatory Visit (INDEPENDENT_AMBULATORY_CARE_PROVIDER_SITE_OTHER): Payer: Medicare Managed Care Other | Admitting: Vascular Surgery

## 2019-11-12 NOTE — Telephone Encounter (Signed)
Left detailed message on voicemail.  

## 2019-11-19 ENCOUNTER — Telehealth (INDEPENDENT_AMBULATORY_CARE_PROVIDER_SITE_OTHER): Payer: Self-pay

## 2019-11-19 ENCOUNTER — Encounter (INDEPENDENT_AMBULATORY_CARE_PROVIDER_SITE_OTHER): Payer: Self-pay | Admitting: Vascular Surgery

## 2019-11-19 ENCOUNTER — Ambulatory Visit (INDEPENDENT_AMBULATORY_CARE_PROVIDER_SITE_OTHER): Payer: Medicare Managed Care Other | Admitting: Vascular Surgery

## 2019-11-19 VITALS — BP 120/68 | HR 114 | Ht 65.0 in | Wt 140.1 lb

## 2019-11-19 DIAGNOSIS — I82621 Acute embolism and thrombosis of deep veins of right upper extremity: Secondary | ICD-10-CM

## 2019-11-19 NOTE — Telephone Encounter (Addendum)
Referral needed

## 2019-11-19 NOTE — Progress Notes (Signed)
Right upper ext swelling    PROGRESS NOTE    Date Time: 11/19/2019 10:20 AM  Patient Name: [name],[name]  Attending Physician: No att. providers found  Primary Care Physician: Kathi Simpers, NP    History of Presenting Illness:   [name] is a 34 y.o. female who was referred by Doctor Lambert Mody for evaluation of her right upper extremity swelling.  The swelling is also associated with pain in the arm as well as neck.  Patient complained of swelling in the right upper extremity for a while and recently had an upper extremity duplex which demonstrated right brachial vein thrombosis. She denies prior history ofDVT or PE.    Risk factors: CAD - NO; DM - no; HTN - yes; HLD - yes; Stroke/TIA - No; Smoking - 1PPD     The following portions of the patient history were reviewed and updated as appropriate: allergies, current medications, past family history, past medical history, past social history, past surgical history and problem list.    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; No COLON present    Fibromyalgia     Gastroesophageal reflux disease     Insomnia     Seasonal allergic rhinitis        Past Surgical History:     Past Surgical History:   Procedure Laterality Date    APPENDECTOMY      CHOLECYSTECTOMY      Double Barrel Enterostomy  2013    for SBO    EGD, COLONOSCOPY N/A 08/19/2019    Procedure: EGD, COLONOSCOPY;  Surgeon: Gwenith Spitz, MD;  [facility];  Service: Gastroenterology;  Laterality: N/A;    Enterostomy reversal  2014    double barrel small bowel ostomies reanastomosed    ILEOSTOMY  2011    for SBO after  colectomy    ILEOSTOMY CLOSURE  2012    multiple times    LAPAROTOMY, COLECTOMY, TOTAL  2011    only a rectum remains    OVARIAN CYST SURGERY  2011-2016    several    SALPINGECTOMY, OPEN Right     2011       Family History:     Family History   Problem Relation Age of Onset    Hypertension Mother     Diabetes Mother     Leukemia Father     Liver disease Maternal Grandfather     Hyperlipidemia Paternal Grandfather        Social History:     Social History     Tobacco Use   Smoking Status Current Every Day Smoker    Packs/day: 1.00    Years: 20.00    Pack years: 20.00    Types: Cigarettes   Smokeless Tobacco Never Used     Social History     Substance and Sexual Activity   Alcohol Use Never     Social History     Substance and Sexual Activity   Drug Use Not Currently    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 Authorizing Provider   acetaminophen (TYLENOL) 325 MG tablet Take 2 tablets (650 mg total) by mouth every 6 (six) hours as needed for Pain 10/10/19  Yes Fadia, Ankur S, MD   albuterol sulfate HFA (PROVENTIL) 108 (90 Base) MCG/ACT inhaler Inhale 2 puffs into the lungs every 4 (four) hours as needed for Wheezing or Shortness of Breath 12/13/18 12/13/19 Yes Sharp, Skyler T, NP   apixaban (Eliquis) 5 MG Take 1 tablet (5 mg total) by mouth every 12 (twelve) hours 11/10/19  Yes Sharp, Skyler T, NP   buprenorphine-naloxone (SUBOXONE) 8-2 MG  Film Place 1 tablet under the tongue 2 (two) times daily   Yes [provider]   clonazePAM (KlonoPIN) 0.5 MG tablet Take 0.25 mg by mouth daily    11/07/19  Yes [provider]   fluticasone-salmeterol (ADVAIR HFA) 230-21 MCG/ACT inhaler Inhale 2 puffs into the lungs 2 (two) times daily 12/13/18  Yes Sharp, Skyler T, NP   hydrOXYzine (ATARAX) 25 MG tablet Take 50 mg by mouth as needed    07/18/19  Yes [provider]   naloxone (NARCAN) 4 MG/0.1ML nasal spray 1 spray intranasally. If pt does not respond or relapses into respiratory depression call 911. Give additional doses every 2-3 min. 03/10/19  Yes Arther Abbott, MD   pregabalin (LYRICA) 300 MG capsule Take 300 mg by mouth 2 (two) times daily 11/10/19  Yes [provider]   promethazine (PHENERGAN) 12.5 MG tablet Take 1 tablet (12.5 mg total) by mouth every 8 (eight) hours as needed for Nausea 11/10/19  Yes Sharp, Skyler T, NP   tiZANidine (ZANAFLEX) 4 MG tablet Take 1 tablet (4 mg total) by mouth every 8 (eight) hours as needed (muscle spasm) 06/02/19  Yes Sharp, Skyler T, NP   topiramate (TOPAMAX) 25 MG tablet Take 25 mg by mouth every 12 (twelve) hours      Yes [provider]   traZODone (DESYREL) 300 MG tablet Take 300 mg by mouth nightly   Yes [provider]   venlafaxine (EFFEXOR-XR) 150 MG 24 hr capsule Take 150 mg by mouth daily   Yes [provider]   pregabalin (LYRICA) 150 MG capsule Take 300 mg by mouth 2 (two) times daily    07/09/19 11/19/19 Yes [provider]   albuterol sulfate HFA (PROVENTIL) 108 (90 Base) MCG/ACT inhaler Inhale 1-2 puffs into the lungs every 4 (four) hours as needed for Wheezing or Shortness of Breath 09/24/19 11/19/19  Osa Craver, MD   diphenoxylate-atropine (LOMOTIL) 2.5-0.025 MG per tablet TAKE 2 TABLETS BY MOUTH 4 TIMES DAILY 11/07/19 11/19/19  Marja Kays T, NP   lidocaine (LIDODERM) 5 % Place 1 patch onto the skin every  24 hours Remove & Discard  patch after 12 hours use 10/13/19 11/19/19  Marja Kays T, NP   mirtazapine (REMERON) 15 MG tablet Take 15 mg by mouth 06/02/19 11/19/19  [provider]     Review of Systems:     General ROS: negative  Psychological ROS: negative  Ophthalmic ROS: negative  ENT ROS: negative  Allergy and Immunology ROS: negative  Hematological and Lymphatic ROS: negative  Endocrine ROS: negative  Respiratory ROS: negative  Cardiovascular ROS: negative  Gastrointestinal ROS: negative  Genito-Urinary ROS: negative  Musculoskeletal ROS: negative  Neurological ROS: negative    Physical Exam:     Vitals:    11/19/19 1007   BP: 120/68   Pulse: (!) 114   SpO2: 97%     Height: 165.1 cm (5\' 5" ) Weight: 63.5 kg (140 lb 1.6 oz) Body mass index is 23.31 kg/m.     General: awake, alert, oriented x 3; no acute distress.  Neurological: Cranial nerves II-XII grossly intact. Motor intact in four extremities.  Extremities:  Warm well perfused  Vascular: palpable bilateral radial and pedal pulse. No carotid bruit bilaterally.    Assessment:   Right upper extremity deep vein thrombosis.  Plan:   Patient was advised to continue taking anticoagulation medication.  She was referred to hematologist for evaluation for hypercoagulable state.  Patient follow-up with me in 6 months after repeat upper extremity venous duplex.    Signed by: Tommas Olp, MD, MD

## 2019-11-19 NOTE — Progress Notes (Signed)
I have reviewed and agree with the documentation of the LPN / MA below.  ______________________________________________________________________   Review of Systems (all are asked and negative if not checked, positives are checked)    CONSTITUTIONAL  []  weight loss   []  changes in appetite    CARDIOVASCULAR  []  chest pain       []  orthopnea     []  palpitations  [x]  edema, RIGHT ARM   []  not able to climb a flight of stairs  []  history of varicose veins  []  claudication   []  rest pain    INTEGUMENTARY  []  rash  []  ulceration  []  skin changes  []  Gangrene    ENDOCRINE   []  diabetes     []  thyroid disorder   GI  [x]  nausea   [x]  vomiting  []  abdominal pain  [x]  diarrhea    [x]  constipation  []  history of GI bleed    GU  []  hematuria  []  incontinence         HEENT  [x]  visual change   []  loss of vision   []  amaurosis          []  hearing loss    RESPIRATORY  []  cough   []  hemoptysis   []  dyspnea   []  Home oxygen use HEMATOLOGIC / LYMPHATIC  []  bleeding or clotting disorder  [x]  history of DVT             []  history of lymphedema     ALLERGIC  []  dye allergy  []  shellfish allergy  []  history of anaphylaxis    NEUROLOGIC   []  CVA    []  TIA    []  syncope    []  memory loss    []  Confusion    PSYCHIATRIC  [x]  depression     [x]  anxiety   []  under psychiatric care     Phylis Bougie, LPN  16/10/9602 10:07 AM

## 2019-11-25 ENCOUNTER — Ambulatory Visit (RURAL_HEALTH_CENTER): Payer: Self-pay

## 2019-11-25 ENCOUNTER — Other Ambulatory Visit (RURAL_HEALTH_CENTER): Payer: Self-pay | Admitting: Nurse Practitioner

## 2019-11-25 ENCOUNTER — Telehealth (RURAL_HEALTH_CENTER): Payer: Self-pay | Admitting: Nurse Practitioner

## 2019-11-25 DIAGNOSIS — G894 Chronic pain syndrome: Secondary | ICD-10-CM

## 2019-11-25 NOTE — Telephone Encounter (Signed)
11/25/2019 - I called patient to explain to her that she is now a patient at [facility] in [facility] for her pain management and she was not happy. I told her I needed to cancel her apt today and she said "just forget it I will find new damn doctors"

## 2019-11-28 ENCOUNTER — Emergency Department
Admission: EM | Admit: 2019-11-28 | Discharge: 2019-11-28 | Disposition: A | Discharge status: Home / Self Care | Payer: Medicare Managed Care Other | Attending: Emergency Medicine | Admitting: Emergency Medicine

## 2019-11-28 ENCOUNTER — Telehealth (RURAL_HEALTH_CENTER): Payer: Self-pay | Admitting: Nurse Practitioner

## 2019-11-28 ENCOUNTER — Emergency Department: Payer: Medicare Managed Care Other

## 2019-11-28 DIAGNOSIS — J189 Pneumonia, unspecified organism: Secondary | ICD-10-CM

## 2019-11-28 DIAGNOSIS — J168 Pneumonia due to other specified infectious organisms: Secondary | ICD-10-CM | POA: Insufficient documentation

## 2019-11-28 DIAGNOSIS — Z20822 Contact with and (suspected) exposure to covid-19: Secondary | ICD-10-CM | POA: Insufficient documentation

## 2019-11-28 DIAGNOSIS — R197 Diarrhea, unspecified: Secondary | ICD-10-CM | POA: Insufficient documentation

## 2019-11-28 DIAGNOSIS — R5383 Other fatigue: Secondary | ICD-10-CM | POA: Insufficient documentation

## 2019-11-28 DIAGNOSIS — R42 Dizziness and giddiness: Secondary | ICD-10-CM | POA: Insufficient documentation

## 2019-11-28 DIAGNOSIS — Z86718 Personal history of other venous thrombosis and embolism: Secondary | ICD-10-CM | POA: Insufficient documentation

## 2019-11-28 LAB — COMPREHENSIVE METABOLIC PANEL
ALT: 13 U/L (ref 0–55)
AST (SGOT): 14 U/L (ref 10–42)
Albumin/Globulin Ratio: 1.23 Ratio (ref 0.80–2.00)
Albumin: 3.8 gm/dL (ref 3.5–5.0)
Alkaline Phosphatase: 97 U/L (ref 40–145)
Anion Gap: 11.2 mMol/L (ref 7.0–18.0)
BUN / Creatinine Ratio: 12.6 Ratio (ref 10.0–30.0)
BUN: 12 mg/dL (ref 7–22)
Bilirubin, Total: 0.5 mg/dL (ref 0.1–1.2)
CO2: 26 mMol/L (ref 20.0–30.0)
Calcium: 9.1 mg/dL (ref 8.5–10.5)
Chloride: 108 mMol/L (ref 98–110)
Creatinine: 0.95 mg/dL (ref 0.60–1.20)
EGFR: 79 mL/min/{1.73_m2} (ref 60–150)
Globulin: 3.1 gm/dL (ref 2.0–4.0)
Glucose: 98 mg/dL (ref 71–99)
Osmolality Calculated: 281 mOsm/kg (ref 275–300)
Potassium: 4.2 mMol/L (ref 3.5–5.3)
Protein, Total: 6.9 gm/dL (ref 6.0–8.3)
Sodium: 141 mMol/L (ref 136–147)

## 2019-11-28 LAB — VH XPERT XPRESS(C) SARS-COV-2 QUALITATIVE PCR
Does patient reside in a congregate care setting?: NEGATIVE
Is patient employed in a healthcare setting?: NEGATIVE
SARS CoV-2 by PCR: NEGATIVE

## 2019-11-28 LAB — CBC AND DIFFERENTIAL
Bands: 4 % (ref 0–10)
Basophils %: 1 % (ref 0.0–3.0)
Basophils Absolute: 0.2 10*3/uL (ref 0.0–0.3)
Eosinophils %: 1 % (ref 0.0–7.0)
Eosinophils Absolute: 0.2 10*3/uL (ref 0.0–0.8)
Hematocrit: 43.3 % (ref 36.0–48.0)
Hemoglobin: 14 gm/dL (ref 12.0–16.0)
Lymphocytes Absolute: 1.4 10*3/uL (ref 0.6–5.1)
Lymphocytes: 8 % — ABNORMAL LOW (ref 15.0–46.0)
MCH: 34 pg (ref 28–35)
MCHC: 32 gm/dL (ref 31–36)
MCV: 104 fL — ABNORMAL HIGH (ref 80–100)
MPV: 7.6 fL (ref 6.0–10.0)
Monocytes Absolute: 0.5 10*3/uL (ref 0.1–1.7)
Monocytes: 3 % (ref 3.0–15.0)
Neutrophils %: 83 % — ABNORMAL HIGH (ref 42.0–78.0)
Neutrophils Absolute: 15.7 10*3/uL — ABNORMAL HIGH (ref 1.7–8.6)
PLT CT: 362 10*3/uL (ref 130–440)
RBC: 4.16 10*6/uL (ref 3.80–5.00)
RDW: 15.4 % — ABNORMAL HIGH (ref 10.5–14.5)
WBC: 18 10*3/uL — ABNORMAL HIGH (ref 4.0–11.0)

## 2019-11-28 LAB — HCG QUANTITATIVE: BHCG Quant.: <1.2 m[IU]/mL

## 2019-11-28 LAB — TROPONIN I: Troponin I: 0.04 ng/mL — ABNORMAL HIGH (ref 0.00–0.02)

## 2019-11-28 MED ORDER — IOHEXOL 350 MG/ML IV SOLN
70.0000 mL | Freq: Once | INTRAVENOUS | Status: AC | PRN
Start: 2019-11-28 — End: 2019-11-28
  Administered 2019-11-28: 12:00:00 70 mL via INTRAVENOUS

## 2019-11-28 MED ORDER — AZITHROMYCIN 250 MG PO TABS
ORAL_TABLET | ORAL | 0 refills | Status: AC
Start: 2019-11-28 — End: 2019-12-03

## 2019-11-28 MED ORDER — BENZONATATE 200 MG PO CAPS
200.0000 mg | ORAL_CAPSULE | Freq: Three times a day (TID) | ORAL | 0 refills | Status: DC | PRN
Start: 2019-11-28 — End: 2019-12-30

## 2019-11-28 MED ORDER — ONDANSETRON HCL 4 MG/2ML IJ SOLN
INTRAMUSCULAR | Status: AC
Start: 2019-11-28 — End: ?
  Filled 2019-11-28: qty 2

## 2019-11-28 MED ORDER — ONDANSETRON HCL 4 MG/2ML IJ SOLN
4.0000 mg | Freq: Once | INTRAMUSCULAR | Status: AC
Start: 2019-11-28 — End: 2019-11-28
  Administered 2019-11-28: 11:00:00 4 mg via INTRAVENOUS

## 2019-11-28 MED ORDER — SODIUM CHLORIDE 0.9 % IV BOLUS
1000.0000 mL | Freq: Once | INTRAVENOUS | Status: AC
Start: 2019-11-28 — End: 2019-11-28
  Administered 2019-11-28: 11:00:00 1000 mL via INTRAVENOUS

## 2019-11-28 NOTE — Discharge Instructions (Signed)
Pneumonia (Adult)  Pneumonia is an infection deep in the lungs. It is in the small air sacs (alveoli). It may be caused by a virus, fungus, or bacteria. Pneumonia caused by bacteriais often treated with an antibiotic. Severe cases may need to be treated in the hospital. Milder cases can be treated at home. Pneumonia symptoms are a lot like flu symptoms. They include fever, cough (dry or with phlegm), headache, muscle weakness, and pain. These symptoms often get worse in the first 2 days. But they often start to get better in the first week of treatment.    Home care  Follow these guidelines when caring for yourself at home:   Get plenty of rest. Don't let yourself get overly tired when you go back to your activities. Participate in activities as directed by your healthcare provider.   Stop smoking. This is the most important step you can take to help treat pneumonia. If you need help stopping smoking, talk with your healthcare provider.   Stay away from smoke and other irritants. Stay away from secondhand smoke. Don't let anyone smoke in your home.   Prevent lung infections. Ask your healthcare provider about the flu and pneumonia vaccines. Take steps to prevent colds and other lung infections.   Practice correct handwashing. Wash your hands often with soap and water. Use hand sanitizer when you can't wash your hands. Stay away from crowds during cold and flu season.   Use pain medicine as directed. You may use acetaminophen or ibuprofen to control fever or pain, unless another medicine was prescribed. If you have chronic liver or kidney disease, talk with your healthcare provider before using these medicines. Also talk with your provider if you've had a stomach ulcer or GI (gastrointestinal) bleeding. Don't give aspirin to a child younger than age 19 unless directed by the provider. Taking aspirin can put a child at risk for Reye syndrome. This is a rare but very serious disorder. It most often affects the  brain and the liver.   Eat a light diet as needed. You may not feel like eating, so a light diet is fine. Follow the treatment plan as advised by your healthcare provider.   Drink plenty of water and fluids. This can make mucus thinner and easier to cough up. Ask your healthcare provider how much water you should drink. For many people, 6 to 8 glasses (8 ounces each) a day is a good goal. Other fluids include sport drinks, sodas without caffeine, juices, tea, or soup. If you also have heart or kidney disease, check with your provider before you drink extra fluids.   Finish all prescription medicine. Take antibiotic or antiviral medicine as prescribed by your healthcare provider, even if you are feeling better after a few days. Take the medicine until it is all gone.   Try to stay away from air pollution. If you live in an area with air pollution, track the Air Quality Index (AQI) reports and plan your outdoor activities with the AQI recommendations in mind  Follow-up care  Follow up with your healthcare provider in the next 2 to 3 days, or as advised. This is to be sure the medicine is helping you get better.  If you are 65 or older, you should get a pneumococcal vaccine and a yearlyflu (influenza)shot. You should also get these vaccines if you have chronic lung disease such as asthma, emphysema, or COPD. A second type of pneumonia vaccine is also available for people over age 65   and those younger than 65 with certain health conditions. Talk with your healthcare provider about which pneumococcal vaccine is best for you.  Call 911  Call 911 if any of these occur:   Unable to speak or swallow   Lips or skin looks blue, purple, or gray   Feeling dizzy or faint   Unable to wake up or loss of consciousness   Feeling of doom   Trouble breathing or wheezing   Shortness of breath gets worse or doesn't get better with treatment   Rapid breathing (more than 25 breaths per minute)   Coughing up blood   Chest  pain gets worse with breathing or doesn't get better with treatment  When to get medical advice  Call your healthcare provider right away if any of these occur:   You don't get better in the first 2 days of treatment   Fever of100.4F (38C) or higher, or as directed by your healthcare provider   Shaking chills   Cough with phlegm that doesn't get better, or get worse   Shortness of breath with activities   Weakness, dizziness, or fainting that gets worse   Thirst or dry mouth that gets worse   Sinus pain, headache, or a stiff neck   Chest pain with breathing or coughing   Symptoms that get worse or not improving  [employer] last reviewed this educational content on 11/23/2017   2000-2021 [employer]. All rights reserved. This information is not intended as a substitute for professional medical care. Always follow your healthcare professional's instructions.

## 2019-11-28 NOTE — Telephone Encounter (Signed)
Patient called wanting a telehealth appointment her symptoms were BP 86/48 pulse of 117 and patient reported she had not urinated in about 20 couple hours. Spoke with clinical staff and advised patient to go to the ER.

## 2019-11-28 NOTE — ED Notes (Signed)
Pts O2 Sat dropped to 85% with a good wave form on the monitor  She then c/o chest discomfort.  Put on 2L NC and sat came up to 96% immediately  Placed on monitor SR without any ectopy Dr Freida Busman aware of complaints

## 2019-11-28 NOTE — ED Notes (Signed)
I went into pts room to discharge her and she was sitting up on the stretcher and had just removed her own IV and threw it on the floor  I instructed her not to fall in the IVF on the floor as she gets dressed  Instructions reviewed with her and she verbalized understanding.

## 2019-11-28 NOTE — EDIE (Signed)
COLLECTIVE?NOTIFICATION?11/28/2019 09:26?[name], URQUIDI M?MRN: [id number]    [facility]'s patient encounter information:   JXB:?[id number]  Account [id number]  Billing Account [id number]      Criteria Met      High Utilization (6+ ED Visits/6 Mo.)    Security and Safety  No recent Security Events currently on file    ED Care Guidelines  There are currently no ED Care Guidelines for this patient. Please check your facility's medical records system.        Prescription Monitoring Program  000??- Narcotic Use Score  000??- Sedative Use Score  000??- Stimulant Use Score  000??- Overdose Risk Score  - All Scores range from 000-999 with 75% of the population scoring < 200 and on 1% scoring above 650  - The last digit of the narcotic, sedative, and stimulant score indicates the number of active prescriptions of that type  - Higher Use scores correlate with increased prescribers, pharmacies, mg equiv, and overlapping prescriptions  - Higher Overdose Risk Scores correlate with increased risk of unintentional overdose death   Concerning or unexpectedly high scores should prompt a review of the PMP record; this does not constitute checking PMP for prescribing purposes.      E.D. Visit Count (12 mo.)  Facility Visits   [facility] [address] - [facility] 3   [facility] 2   Total 18   Note: Visits indicate total known visits.     Recent Emergency Department Visit Summary  Showing 10 most recent visits out of 18 in the past 12 months  Date Facility [facility] Type Diagnoses or Chief Complaint   Nov 28, 2019 [name] [place] [facility]      low blood pressure, trouble urinating, cough, weakness      Oct 27, 2019 [name] [place] [facility]      SOB/COVID SYMPTOMS      Arm Pain      Headache      Fever      Sore Throat      Malingerer [conscious simulation]      Procedure and treatment not carried  out due to patient leaving prior to being seen by health care provider      Acute embolism and thrombosis of deep veins of right upper extremity      Oct 16, 2019 [name] [place] [facility]      chest pain      Arm Pain      Fatigue      Shortness of Breath      Pain in right arm      Other chest pain      Oct 13, 2019 [name] [place] [facility]      neck pain      Arm Pain      Deep Venous Thrombosis      Patient's noncompliance with other medical treatment and regimen      Oct 10, 2019 [name] [place] [facility]      Arm pain      Malingerer [conscious simulation]      Opioid abuse, in remission      Pain in right arm      Oct 10, 2019 [name]      Arm Pain and swelling  Arm Injury      Acute embolism and thrombosis of deep veins of right upper extremity      Sep 24, 2019 [name] [place] [facility]      Fever; Headache; Nausea      Viral infection, unspecified      Sep 13, 2019 [name] [place] [facility]      Short term memory loss, headache      Generalized Body Aches      Memory Loss      Weakness      Urinary tract infection, site not specified      May 24, 2019 [name] [place] [facility]      OverDose      Drug Overdose      Poisoning by unspecified drugs, medicaments and biological substances, undetermined, initial encounter      May 12, 2019 [name] [place] [facility]      SHOULDER INJURY      Shoulder Pain      Unspecified sprain of right shoulder joint, initial encounter          Recent Inpatient Visit Summary  Date Facility [facility] Type Diagnoses or Chief Complaint   May 24, 2019 [facility] [place] [place] [facility]      Poisoning by unspecified drugs, medicaments and biological substances, accidental (unintentional), initial encounter      Fibromyalgia      May 03, 2019 [facility] [place] [place] [facility]       Unspecified intestinal obstruction, unspecified as to partial versus complete obstruction      Other chronic pain      Unspecified abdominal pain      Other specified functional intestinal disorders      Generalized anxiety disorder      Fibromyalgia      Bipolar disorder, unspecified      Mar 11, 2019 [facility] [place] [place] [facility]      Unspecified intestinal obstruction, unspecified as to partial versus complete obstruction      Mar 08, 2019 [facility] [place] [place] [facility]      Unspecified intestinal obstruction, unspecified as to partial versus complete obstruction      Dec 05, 2018 [facility] [place]  [facility]      Unspecified intestinal obstruction, unspecified as to partial versus complete obstruction          Care Team  Provider Specialty Phone Fax Service Dates   Adline Mango, MD Family Medicine [phone] Jan 24, 2019 - Current      Collective Portal  This patient has registered at the [facility] Emergency Department   For more information visit: [email or web address]     PLEASE NOTE:     1.   Any care recommendations and other clinical information are provided as guidelines or for historical purposes only, and providers should exercise their own clinical judgment when providing care.    2.   You may only use this information for purposes of treatment, payment or health care operations activities, and subject to the limitations of applicable [employer].    3.   You should consult directly with the organization that provided a care guideline or other clinical history with any questions about additional information or accuracy or completeness of information provided.    ? 2021  [employer] - [email or web address]

## 2019-11-28 NOTE — ED Provider Notes (Signed)
Physician/Midlevel provider first contact with patient: 11/28/19 1610         History     Chief Complaint   Patient presents with    Hypotension    Dizziness     Patient presents for evaluation of low blood pressure. 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 She is currently on Eliquis for this. She notes that the arm will occasionally swell and then resolve on down. She noted yesterday that the pain in the arm seem to be increased and would cause sharp pain up in her right anterior chest wall. She did have some swelling that has decreased down today. However yesterday also she was complaining of increased fatigue. She noted to just be generally feeling poorly. She did complain of some lightheaded dizziness of a near syncope type feeling but did not have any syncope. Patient also with some mild nasal congestion as well. Patient is a still was feeling poor. She took her blood pressure and noted it to be 86/47 at home which is low for her. Heart rate also was 114. Patient contacted her primary care provider was referred to emergency department. Patient denies associated temperature, fever. She does have some nasal congestion. Denies any sore throat. Patient denies any cough. She does have some shortness of breath. Her chest pain is located on the right anterior chest wall it is a sharp pain that is increased with motion of respiration. There is no radiation. Patient initially denies any nausea but did have some mild nausea yesterday and now notes that has returned to the emergency department.. She denies any change in her chronic diarrhea. Denies any urinary symptoms. Patient without complaint of leg pain or leg swelling.               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     No mechanical SBO - atonic bowel with 6 hour SB transit time; No COLON present    Fibromyalgia     Gastroesophageal reflux disease     Insomnia     Seasonal allergic rhinitis        Past Surgical History:   Procedure Laterality Date    APPENDECTOMY      CHOLECYSTECTOMY      Double Barrel Enterostomy  2013    for SBO    EGD, COLONOSCOPY N/A 08/19/2019    Procedure: EGD, COLONOSCOPY;  Surgeon: Gwenith Spitz, MD;  Location: [facility];  Service: Gastroenterology;  Laterality: N/A;    Enterostomy reversal  2014    double barrel small bowel ostomies reanastomosed    ILEOSTOMY  2011    for SBO after colectomy    ILEOSTOMY CLOSURE  2012    multiple times    LAPAROTOMY, COLECTOMY, TOTAL  2011    only a rectum remains    OVARIAN CYST SURGERY  2011-2016    several    SALPINGECTOMY, OPEN Right     2011       Family History   Problem Relation Age of Onset    Hypertension Mother     Diabetes Mother     Leukemia Father  Liver disease Maternal Grandfather     Hyperlipidemia Paternal Grandfather        Social  Social History     Tobacco Use    Smoking status: Current Every Day Smoker     Packs/day: 1.00     Years: 20.00     Pack years: 20.00     Types: Cigarettes    Smokeless tobacco: Never Used   Vaping Use    Vaping Use: Never used   Substance Use Topics    Alcohol use: Never    Drug use: Not Currently     Types: Heroin     Comment: opiates -- last use 2013       .     Allergies   Allergen Reactions    Fentanyl Anaphylaxis    Ketorolac Tromethamine Itching    Morphine Itching    Vancomycin Other (See Comments) and Edema     throat swelling and turns red     Latex Itching    Oxycodone Hives    Oxycodone-Acetaminophen Hives    Reglan [Metoclopramide] Hives    Rocephin [Ceftriaxone] Hives    Tramadol Itching and Nausea And Vomiting    Walnuts [Tree Nuts] Other (See Comments)     diverticulitis        Home Medications             acetaminophen (TYLENOL) 325 MG  tablet     Take 2 tablets (650 mg total) by mouth every 6 (six) hours as needed for Pain     albuterol sulfate HFA (PROVENTIL) 108 (90 Base) MCG/ACT inhaler     Inhale 2 puffs into the lungs every 4 (four) hours as needed for Wheezing or Shortness of Breath     apixaban (Eliquis) 5 MG     Take 1 tablet (5 mg total) by mouth every 12 (twelve) hours     buprenorphine-naloxone (SUBOXONE) 8-2 MG Film     Place 1 tablet under the tongue 2 (two) times daily     clonazePAM (KlonoPIN) 0.5 MG tablet     Take 0.25 mg by mouth daily        fluticasone-salmeterol (ADVAIR HFA) 230-21 MCG/ACT inhaler     Inhale 2 puffs into the lungs 2 (two) times daily     hydrOXYzine (ATARAX) 25 MG tablet     Take 50 mg by mouth as needed        naloxone (NARCAN) 4 MG/0.1ML nasal spray     1 spray intranasally. If pt does not respond or relapses into respiratory depression call 911. Give additional doses every 2-3 min.     pregabalin (LYRICA) 300 MG capsule     Take 300 mg by mouth 2 (two) times daily     promethazine (PHENERGAN) 12.5 MG tablet     Take 1 tablet (12.5 mg total) by mouth every 8 (eight) hours as needed for Nausea     tiZANidine (ZANAFLEX) 4 MG tablet     Take 1 tablet (4 mg total) by mouth every 8 (eight) hours as needed (muscle spasm)     topiramate (TOPAMAX) 25 MG tablet     Take 25 mg by mouth every 12 (twelve) hours        traZODone (DESYREL) 300 MG tablet     Take 300 mg by mouth nightly     venlafaxine (EFFEXOR-XR) 150 MG 24 hr capsule     Take 150 mg by mouth daily  Review of Systems   Constitutional: Positive for fatigue. Negative for activity change, appetite change, chills and fever.   HENT: Positive for congestion. Negative for rhinorrhea and sore throat.    Eyes: Negative for redness and visual disturbance.   Respiratory: Positive for shortness of breath. Negative for cough.    Cardiovascular: Positive for chest pain.   Gastrointestinal: Positive for diarrhea and nausea (has returned in the ED). Negative for  abdominal pain and vomiting.   Genitourinary: Negative for difficulty urinating and dysuria.   Musculoskeletal: Positive for myalgias. Negative for arthralgias.   Skin: Negative for rash and wound.   Neurological: Positive for dizziness and light-headedness. Negative for weakness, numbness and headaches.   Psychiatric/Behavioral: Negative for confusion.       Physical Exam    BP: (!) 86/46, Heart Rate: 88, Temp: 98.2 F (36.8 C), Resp Rate: 18, SpO2: 96 %, Weight: 64.3 kg    Physical Exam  Vitals and nursing note reviewed.   Constitutional:       General: She is not in acute distress.     Appearance: Normal appearance.   HENT:      Head: Normocephalic and atraumatic.      Right Ear: Tympanic membrane, ear canal and external ear normal.      Left Ear: Tympanic membrane, ear canal and external ear normal.      Nose: Nose normal. No congestion or rhinorrhea.      Mouth/Throat:      Mouth: Mucous membranes are moist.      Pharynx: Oropharynx is clear. No oropharyngeal exudate or posterior oropharyngeal erythema.   Eyes:      General: No scleral icterus.        Right eye: No discharge.         Left eye: No discharge.      Conjunctiva/sclera: Conjunctivae normal.      Pupils: Pupils are equal, round, and reactive to light.   Cardiovascular:      Rate and Rhythm: Normal rate and regular rhythm.      Pulses: Normal pulses.      Heart sounds: Normal heart sounds.   Pulmonary:      Effort: Pulmonary effort is normal.      Breath sounds: Normal breath sounds. No wheezing, rhonchi or rales.   Chest:      Chest wall: Tenderness (Tender right anterior upper chest wall. No crepitus.) present.   Abdominal:      General: Abdomen is flat.      Palpations: Abdomen is soft.      Tenderness: There is no abdominal tenderness.   Musculoskeletal:         General: Swelling (Right arm swollen with mild tenderness.) and tenderness (Right arm) present.      Cervical back: Normal range of motion and neck supple. No rigidity or tenderness.    Lymphadenopathy:      Cervical: No cervical adenopathy.   Skin:     General: Skin is warm and dry.      Findings: No erythema or rash.   Neurological:      General: No focal deficit present.      Mental Status: She is alert.   Psychiatric:         Mood and Affect: Mood normal.         Behavior: Behavior normal.         Labs  Results     Procedure Component Value Units Date/Time    CBC and  differential [[id number]]  (Abnormal) Collected: 11/28/19 1120    Specimen: Blood Updated: 11/28/19 1204     WBC 18.0 K/cmm      RBC 4.16 M/cmm      Hemoglobin 14.0 gm/dL      Hematocrit 40.9 %      MCV 104 fL      MCH 34 pg      MCHC 32 gm/dL      RDW 81.1 %      PLT CT 362 K/cmm      MPV 7.6 fL      Neutrophils % 83.0 %      Lymphocytes 8.0 %      Monocytes 3.0 %      Eosinophils % 1.0 %      Basophils % 1.0 %      Bands 4 %      Atypical Lymph % Few     Neutrophils Absolute 15.7 K/cmm      Lymphocytes Absolute 1.4 K/cmm      Monocytes Absolute 0.5 K/cmm      Eosinophils Absolute 0.2 K/cmm      Basophils Absolute 0.2 K/cmm      RBC Morphology RBC Morphology Reviewed     Macrocytic 1+     Microcytic 1+     Anisocytosis 1+    Narrative:      Manual differential performed    Xpert Xpress(R) SARS-CoV-2 Qualitative PCR [[id number]] Collected: 11/28/19 1100    Specimen: Nasopharyngeal Swab Updated: 11/28/19 1200     SARS CoV-2 by PCR Negative     Does patient have symptoms related to condition of interest? Y     Is patient employed in a healthcare setting? N     Does patient reside in a congregate care setting? N     Is the patient pregnant? UNK    Narrative:      Specimen source - Nasopharyngeal Swab       Beta HCG, Quant, Serum [[id number]] Collected: 11/28/19 1120    Specimen: Plasma Updated: 11/28/19 1157     BHCG Quant. <1.2 mIU/mL     Troponin I [[id number]]  (Abnormal) Collected: 11/28/19 1120    Specimen: Plasma Updated: 11/28/19 1150     Troponin I 0.04 ng/mL     Comprehensive metabolic panel [[id number]] Collected: 11/28/19 1120     Specimen: Plasma Updated: 11/28/19 1145     Sodium 141 mMol/L      Potassium 4.2 mMol/L      Chloride 108 mMol/L      CO2 26.0 mMol/L      Calcium 9.1 mg/dL      Glucose 98 mg/dL      Creatinine 2.72 mg/dL      BUN 12 mg/dL      Protein, Total 6.9 gm/dL      Albumin 3.8 gm/dL      Alkaline Phosphatase 97 U/L      ALT 13 U/L      AST (SGOT) 14 U/L      Bilirubin, Total 0.5 mg/dL      Albumin/Globulin Ratio 1.23 Ratio      Anion Gap 11.2 mMol/L      BUN / Creatinine Ratio 12.6 Ratio      EGFR 79 mL/min/1.26m2      Osmolality Calculated 281 mOsm/kg      Globulin 3.1 gm/dL           Radiologic Studies  Radiology Results (24 Hour)     Procedure  Component Value Units Date/Time    CT ANGIOGRAM CHEST [[id number]] Collected: 11/28/19 1220    Order Status: Completed Updated: 11/28/19 1225    Narrative:      Clinical History:  PE suspected, high pretest prob    Examination:  Serial axial images were obtained through the thorax with unenhanced timing bolus evaluation of the pulmonary artery, this was then followed by rapid infusion of IV contrast. Post enhanced images were sent to workstation for 3-D reconstructions including   sagittal and coronal MIP acquisitions used to confirm axial data findings.    CT images were acquired utilizing Automated Exposure Control for dose reduction.     Contrast:  IOHEXOL 350 MG/ML IV SOLN/70 mL    Comparison:  CT angiography the chest performed on 10/16/2019.      Findings:  CT angiography of the chest was performed after rapid infusion of nonionic intravenous contrast. Sagittal and coronal reconstructions were performed. The examination demonstrates subtle patchy infiltrate involving the posterior segment of the right upper   lobe and the superior basilar segment of the right lower lobe. The left lung is clear. No evidence for pulmonary emboli or aortic dissection is seen. The cardiac silhouette is within normal limits.      Impression:      There is subtle patchy infiltrate involving the  posterior segment of the right upper lobe and the superior basilar segment of the right lower lobe. Left lung is clear. The cardiac silhouette is within normal limits. No evidence for pulmonary emboli or   aortic dissection is seen.    ReadingStation:[id number]      .    EKG Results  Last EKG Result     None            MDM and ED Course     ED Medication Orders (From admission, onward)    Start Ordered     Status Ordering Provider    11/28/19 1210 11/28/19 1217  iohexol (OMNIPAQUE) 350 MG/ML injection 70 mL  IMG once as needed        Route: Intravenous  Ordered Dose: 70 mL     Last MAR action: Imaging Agent Given Chucky May    11/28/19 1006 11/28/19 1005  sodium chloride 0.9 % bolus 1,000 mL  Once in ED        Route: Intravenous  Ordered Dose: 1,000 mL     Last MAR action: New Bag Ammon Muscatello W    11/28/19 1006 11/28/19 1005  ondansetron (ZOFRAN) injection 4 mg  Once in ED        Route: Intravenous  Ordered Dose: 4 mg     Last MAR action: Given Rashawna Scoles W               MDM  Patient with low blood pressure. Patient since yesterday has been feeling poorly with general malaise and fatigue. Also with lightheaded dizziness. Patient has a blood clot in her right arm. Patient also with complaint of chest pain as well. Examination shows patient blood pressure of 86/46 with heart rate of 88. Lungs are clear to auscultation. Chest wall is tender in the right upper area. Heart is regular rate and rhythm. Right arm is swollen and tender. Rest exam does not show any acute focal changes.    With patient having a DVT in her arm and now having chest discomfort and low blood pressure will investigate for pulmonary emboli. Also considering viral syndrome. Other etiologies are also considered  as well but are felt to be less likely at this time.     12:58 PM laboratory and imaging results returned.  Patient with elevated white count which be consistent with the infiltrate noted on CT scan.  Feel that patient is most  consistent with pneumonia.  There is a mild elevation in her troponin however I do not feel that patient is having acute coronary event.  This most likely is due to the pneumonia.  I discussed these results with patient she is agreement with this.  Therefore the plan is to discharge patient home on oral antibiotics.  Also to follow-up with her primary care provider.  She can also return to emergency department for any concerns or issues.  Patient voices understanding, agrees with plan and questions were answered.          Procedures    Clinical Impression & Disposition     Clinical Impression  Final diagnoses:   Pneumonia of right upper lobe due to infectious organism        ED Disposition     ED Disposition Condition Date/Time Comment    Discharge  Fri Nov 28, 2019 12:58 PM Anedra Arvilla Meres discharge to home/self care.    Condition at disposition: Stable           New Prescriptions    AZITHROMYCIN (ZITHROMAX) 250 MG TABLET    Take 500 mg (2 tablets) the first day and then 250 mg (1 tablet) every day until completed.    BENZONATATE (TESSALON) 200 MG CAPSULE    Take 1 capsule (200 mg total) by mouth 3 (three) times daily as needed for Cough                 Chucky May, MD  11/28/19 1259

## 2019-12-01 ENCOUNTER — Ambulatory Visit: Payer: Medicare Managed Care Other | Attending: Family Medicine | Admitting: Family Medicine

## 2019-12-02 ENCOUNTER — Ambulatory Visit: Payer: Medicare Managed Care Other | Attending: Family Medicine | Admitting: Family Medicine

## 2019-12-02 ENCOUNTER — Encounter (RURAL_HEALTH_CENTER): Payer: Self-pay | Admitting: Family Medicine

## 2019-12-02 VITALS — BP 112/66 | HR 85 | Temp 96.7°F | Resp 18 | Ht 65.0 in | Wt 138.8 lb

## 2019-12-02 DIAGNOSIS — D72829 Elevated white blood cell count, unspecified: Secondary | ICD-10-CM

## 2019-12-02 DIAGNOSIS — J189 Pneumonia, unspecified organism: Secondary | ICD-10-CM

## 2019-12-02 LAB — ECG 12-LEAD
P Wave Axis: 10 deg
P-R Interval: 104 ms
Patient Age: 34 years
Q-T Interval(Corrected): 412 ms
Q-T Interval: 350 ms
QRS Axis: 78 deg
QRS Duration: 85 ms
T Axis: 35 years
Ventricular Rate: 83 //min

## 2019-12-02 NOTE — Progress Notes (Signed)
Subjective:    Patient ID: [name] is a 34 y.o. female.    Chief Complaint   Patient presents with    Pneumonia     f/u       HPI  Pneumonia:  The patient was seen and treated in the ED for pneumonia.  Her blood pressure was low and she had a complaint of fatigue.  She had pain to the R side of her chest.  She was complaining of dizziness and near syncope without loss of consciousness.  She had no fever or chills and no cough but she did have shortness of breath.  She had nasal congestion.  She has been compliant with antibiotics and she does feel that her symptoms are improving.    The following portions of the patient's history were reviewed and updated as appropriate: allergies, current medications, past family history, past medical history, past social history, past surgical history and problem list.    Patient Active Problem List   Diagnosis    PCOS (polycystic ovarian syndrome)    Bipolar disorder    Chronic hepatitis C    Crohn's disease    Fibromyalgia    Grand mal status    Human papilloma virus infection    Insomnia    Encounter for long-term (current) use of high-risk medication    Bipolar 2 disorder, major depressive episode    GAD (generalized anxiety disorder)    Moderate episode of recurrent major depressive disorder    Fatigue    Gastroesophageal reflux disease    Migraine    Seizure    Diffuse dysfunction of smooth muscle of gastrointestinal tract    Chronic abdominal pain    Chronic obstructive pulmonary disease    Overdose    Abnormal cervical Papanicolaou smear with positive human papilloma virus (HPV) DNA test    Cyst of ovary    Peritoneal adhesion       Current Outpatient Medications:     acetaminophen (TYLENOL) 325 MG tablet, Take 2 tablets (650 mg total) by mouth every 6 (six) hours as needed for Pain, Disp: 20 tablet, Rfl: 0    albuterol sulfate HFA (PROVENTIL) 108 (90 Base) MCG/ACT inhaler, Inhale 2 puffs into the lungs every 4 (four) hours as  needed for Wheezing or Shortness of Breath, Disp: 1 Inhaler, Rfl: 2    apixaban (Eliquis) 5 MG, Take 1 tablet (5 mg total) by mouth every 12 (twelve) hours, Disp: 60 tablet, Rfl: 5    benzonatate (TESSALON) 200 MG capsule, Take 1 capsule (200 mg total) by mouth 3 (three) times daily as needed for Cough, Disp: 20 capsule, Rfl: 0    buprenorphine-naloxone (SUBOXONE) 8-2 MG Film, Place 1 tablet under the tongue 2 (two) times daily, Disp: , Rfl:     clonazePAM (KlonoPIN) 0.5 MG tablet, Take 0.25 mg by mouth daily  , Disp: , Rfl:     fluticasone-salmeterol (ADVAIR HFA) 230-21 MCG/ACT inhaler, Inhale 2 puffs into the lungs 2 (two) times daily, Disp: 12 g, Rfl: 2    hydrOXYzine (ATARAX) 25 MG tablet, Take 50 mg by mouth as needed  , Disp: , Rfl:     naloxone (NARCAN) 4 MG/0.1ML nasal spray, 1 spray intranasally. If pt does not respond or relapses into respiratory depression call 911. Give additional doses every 2-3 min., Disp: 2 each, Rfl: 0    pregabalin (LYRICA) 300 MG capsule, Take 300 mg by mouth 2 (two) times daily, Disp: , Rfl:  promethazine (PHENERGAN) 12.5 MG tablet, Take 1 tablet (12.5 mg total) by mouth every 8 (eight) hours as needed for Nausea, Disp: 20 tablet, Rfl: 0    tiZANidine (ZANAFLEX) 4 MG tablet, Take 1 tablet (4 mg total) by mouth every 8 (eight) hours as needed (muscle spasm), Disp: 21 tablet, Rfl: 0    topiramate (TOPAMAX) 25 MG tablet, Take 25 mg by mouth every 12 (twelve) hours  , Disp: , Rfl:     traZODone (DESYREL) 300 MG tablet, Take 300 mg by mouth nightly, Disp: , Rfl:     venlafaxine (EFFEXOR-XR) 150 MG 24 hr capsule, Take 150 mg by mouth daily, Disp: , Rfl:     Allergies   Allergen Reactions    Fentanyl Anaphylaxis    Ketorolac Tromethamine Itching    Morphine Itching    Vancomycin Other (See Comments) and Edema     throat swelling and turns red     Latex Itching    Oxycodone Hives    Oxycodone-Acetaminophen Hives    Reglan [Metoclopramide] Hives    Rocephin  [Ceftriaxone] Hives    Tramadol Itching and Nausea And Vomiting    Walnuts [Tree Nuts] Other (See Comments)     diverticulitis        Review of Systems 14 System Review negative except as noted in the history of present illness          Objective:    Physical Exam  Vitals reviewed.   Constitutional:       General: She is not in acute distress.     Appearance: Normal appearance. She is not ill-appearing.   HENT:      Head: Normocephalic and atraumatic.   Eyes:      General: No scleral icterus.        Right eye: No discharge.         Left eye: No discharge.      Conjunctiva/sclera: Conjunctivae normal.   Neck:      Thyroid: No thyroid mass, thyromegaly or thyroid tenderness.      Vascular: No carotid bruit.   Cardiovascular:      Rate and Rhythm: Normal rate and regular rhythm.      Heart sounds: Normal heart sounds. No murmur heard.  No friction rub. No gallop.    Pulmonary:      Effort: Pulmonary effort is normal.      Breath sounds: No stridor. Rales (faint R) present. No rhonchi.   Musculoskeletal:      Cervical back: Neck supple. No tenderness.   Lymphadenopathy:      Cervical: No cervical adenopathy.   Skin:     General: Skin is warm and dry.      Capillary Refill: Capillary refill takes less than 2 seconds.   Neurological:      General: No focal deficit present.      Mental Status: She is alert and oriented to person, place, and time.   Psychiatric:         Mood and Affect: Mood normal.         Behavior: Behavior normal.         Thought Content: Thought content normal.         Judgment: Judgment normal.       Visit Vitals  BP 112/66 (BP Site: Left arm, Patient Position: Sitting, Cuff Size: Large)   Pulse 85   Temp (!) 96.7 F (35.9 C) (Temporal)   Resp 18   Ht 1.651 m (5'  5")   Wt 63 kg (138 lb 12.8 oz)   LMP 11/21/2019   SpO2 98%   BMI 23.10 kg/m     Recent Results (from the past 1008 hour(s))   Xpert Xpress(R) SARS-CoV-2 Qualitative PCR    Collection Time: 10/27/19  8:45 PM    Specimen: Nasopharyngeal  Swab   Result Value Ref Range    SARS CoV-2 by PCR Negative Negative    Does patient have symptoms related to condition of interest? N     Is the patient hospitalized because of this condition? N     Is patient employed in a healthcare setting? N     Does patient reside in a congregate care setting? N     Is the patient pregnant? UNK    ECG 12 lead    Collection Time: 10/27/19 10:14 PM   Result Value Ref Range    Patient Age 45 years    Patient DOB 1985/10/09     Patient Height      Patient Weight      Interpretation Text       Sinus rhythm  Probable left atrial enlargement  Compared to ECG 10/17/2019 03:52:00  T-wave abnormality no longer present  Electronically Signed On 10-28-2019 8:39:44 EDT by Graciela Husbands      Physician Interpreter Graciela Husbands     Ventricular Rate 82 //min    QRS Duration 88 ms    P-R Interval 125 ms    Q-T Interval 366 ms    Q-T Interval(Corrected) 428 ms    P Wave Axis 78 deg    QRS Axis 85 deg    T Axis 56 years   ECG 12 lead    Collection Time: 11/28/19 10:16 AM   Result Value Ref Range    Patient Age 45 years    Patient DOB 05-15-1985     Patient Height      Patient Weight      Interpretation Text       Sinus rhythm  Short PR interval  Compared to ECG 10/27/2019 22:14:21  Short PR interval now present  Electronically Signed On 12-02-2019 19:19:36 EST by Jarrett Soho      Physician Interpreter [facility]     Ventricular Rate 83 //min    QRS Duration 85 ms    P-R Interval 104 ms    Q-T Interval 350 ms    Q-T Interval(Corrected) 412 ms    P Wave Axis 10 deg    QRS Axis 78 deg    T Axis 35 years   Xpert Xpress(R) SARS-CoV-2 Qualitative PCR    Collection Time: 11/28/19 11:00 AM    Specimen: Nasopharyngeal Swab   Result Value Ref Range    SARS CoV-2 by PCR Negative Negative    Does patient have symptoms related to condition of interest? Y     Is patient employed in a healthcare setting? N     Does patient reside in a congregate care setting? N     Is the patient pregnant? UNK    CBC and  differential    Collection Time: 11/28/19 11:20 AM   Result Value Ref Range    WBC 18.0 (H) 4.0 - 11.0 K/cmm    RBC 4.16 3.80 - 5.00 M/cmm    Hemoglobin 14.0 12.0 - 16.0 gm/dL    Hematocrit 16.1 09.6 - 48.0 %    MCV 104 (H) 80 - 100 fL    MCH 34 28 - 35 pg  MCHC 32 31 - 36 gm/dL    RDW 16.1 (H) 09.6 - 14.5 %    PLT CT 362 130 - 440 K/cmm    MPV 7.6 6.0 - 10.0 fL    Neutrophils % 83.0 (H) 42.0 - 78.0 %    Lymphocytes 8.0 (L) 15.0 - 46.0 %    Monocytes 3.0 3.0 - 15.0 %    Eosinophils % 1.0 0.0 - 7.0 %    Basophils % 1.0 0.0 - 3.0 %    Bands 4 0 - 10 %    Atypical Lymph % Few     Neutrophils Absolute 15.7 (H) 1.7 - 8.6 K/cmm    Lymphocytes Absolute 1.4 0.6 - 5.1 K/cmm    Monocytes Absolute 0.5 0.1 - 1.7 K/cmm    Eosinophils Absolute 0.2 0.0 - 0.8 K/cmm    Basophils Absolute 0.2 0.0 - 0.3 K/cmm    RBC Morphology RBC Morphology Reviewed     Macrocytic 1+     Microcytic 1+     Anisocytosis 1+    Comprehensive metabolic panel    Collection Time: 11/28/19 11:20 AM   Result Value Ref Range    Sodium 141 136 - 147 mMol/L    Potassium 4.2 3.5 - 5.3 mMol/L    Chloride 108 98 - 110 mMol/L    CO2 26.0 20.0 - 30.0 mMol/L    Calcium 9.1 8.5 - 10.5 mg/dL    Glucose 98 71 - 99 mg/dL    Creatinine 0.45 4.09 - 1.20 mg/dL    BUN 12 7 - 22 mg/dL    Protein, Total 6.9 6.0 - 8.3 gm/dL    Albumin 3.8 3.5 - 5.0 gm/dL    Alkaline Phosphatase 97 40 - 145 U/L    ALT 13 0 - 55 U/L    AST (SGOT) 14 10 - 42 U/L    Bilirubin, Total 0.5 0.1 - 1.2 mg/dL    Albumin/Globulin Ratio 1.23 0.80 - 2.00 Ratio    Anion Gap 11.2 7.0 - 18.0 mMol/L    BUN / Creatinine Ratio 12.6 10.0 - 30.0 Ratio    EGFR 79 60 - 150 mL/min/1.22m2    Osmolality Calculated 281 275 - 300 mOsm/kg    Globulin 3.1 2.0 - 4.0 gm/dL   Troponin I    Collection Time: 11/28/19 11:20 AM   Result Value Ref Range    Troponin I 0.04 (H) 0.00 - 0.02 ng/mL   Beta HCG, Quant, Serum    Collection Time: 11/28/19 11:20 AM   Result Value Ref Range    BHCG Quant. <1.2 mIU/mL     CT ANGIOGRAM  CHEST    Result Date: 11/28/2019  There is subtle patchy infiltrate involving the posterior segment of the right upper lobe and the superior basilar segment of the right lower lobe. Left lung is clear. The cardiac silhouette is within normal limits. No evidence for pulmonary emboli or aortic dissection is seen. ReadingStation:[id number]      Assessment:     1. Pneumonia of right lung due to infectious organism, unspecified part of lung    2. Leukocytosis, unspecified type         Plan:     Orders Placed This Encounter   Procedures    CBC and differential     Standing Status:   Future     Standing Expiration Date:   12/01/2020     Order Specific Question:   Release to patient     Answer:  Immediate        Requested Prescriptions      No prescriptions requested or ordered in this encounter       Return if symptoms worsen or fail to improve.    Patient Instructions   1.  I would like for you to rest for about 2 weeks until your lungs heal.  The more that you do the longer it will take for your lungs to heal  2. Azithromycin - the antibiotic will stay in your system for a total of 10 days  3. Increase your fluid intake  4. Stop smoking that way your lungs can recover   5.  We will check a CBC today and if the WBC count is elevated then we will prolong the course of antibiotics  6. Honey and whiskey with a little lemon       Patient was counseled on possible medicine side effects which may include rash, swelling and/or stomach upset.  Patient was instructed to notify me or the ER if they experience problems.    Discussed with patient all diagnostics and consults ordered and addressed patient concerns prior to the completion of the visit.  Patient will be notified of results when they become available.  Encouraged to call for results if no contact within 14 days.     The patient's electronic medical record was reviewed, any changes in the past medical history, past surgical history, medications, diagnostic tests were noted,  and the record was updated accordingly.    Discussed option available for my chart which provides electronic access to diagnostic results.      EHR -  Progress note was generated using [employer].  Random word insertion and deletion, inappropriate gender pronouns, and word inaccuracies may result from software deficiency.  If there any any questions regarding the accuracy of this note, please feel free to contact me for clarification.     Signed by: Harley Hallmark, MD

## 2019-12-02 NOTE — Patient Instructions (Addendum)
1.  I would like for you to rest for about 2 weeks until your lungs heal.  The more that you do the longer it will take for your lungs to heal  2. Azithromycin - the antibiotic will stay in your system for a total of 10 days  3. Increase your fluid intake  4. Stop smoking that way your lungs can recover   5.  We will check a CBC today and if the WBC count is elevated then we will prolong the course of antibiotics  6. Honey and whiskey with a little lemon

## 2019-12-05 ENCOUNTER — Encounter (RURAL_HEALTH_CENTER): Payer: Self-pay | Admitting: Family Medicine

## 2019-12-10 ENCOUNTER — Encounter (INDEPENDENT_AMBULATORY_CARE_PROVIDER_SITE_OTHER): Payer: Self-pay | Admitting: Hematology & Oncology

## 2019-12-10 DIAGNOSIS — M79601 Pain in right arm: Secondary | ICD-10-CM

## 2019-12-10 DIAGNOSIS — I82621 Acute embolism and thrombosis of deep veins of right upper extremity: Secondary | ICD-10-CM

## 2019-12-11 ENCOUNTER — Ambulatory Visit
Admission: RE | Admit: 2019-12-11 | Discharge: 2019-12-11 | Disposition: A | Discharge status: Home / Self Care | Payer: Medicare Managed Care Other | Source: Ambulatory Visit | Attending: Hematology & Oncology | Admitting: Hematology & Oncology

## 2019-12-11 DIAGNOSIS — M79601 Pain in right arm: Secondary | ICD-10-CM | POA: Insufficient documentation

## 2019-12-11 DIAGNOSIS — I82621 Acute embolism and thrombosis of deep veins of right upper extremity: Secondary | ICD-10-CM | POA: Insufficient documentation

## 2019-12-12 ENCOUNTER — Encounter (RURAL_HEALTH_CENTER): Payer: Self-pay | Admitting: Nurse Practitioner

## 2019-12-12 NOTE — Progress Notes (Signed)
PROGRESS NOTE      Patient Name: [name],[name]  Primary Care Physician: Kathi Simpers, NP      History of Presenting Illness:   [name] is a 34 y.o. female who presents to the office with   Chief Complaint   Patient presents with    ER Follow-up     dvt     DVT:  Presents today for an ER follow-up on 10/10/2014 for right upper arm pain.  She was found to have a DVT in her right upper arm.  She denies any IV drug use or trauma.  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  She reports that she is in severe pain and is going to need something to control her pain.  Discussed how she is currently on a pain contract with her Suboxone therapy and this is not an appropriate treatment even if she was not on a pain contract.  She is currently prescribed Eliquis for treatment of her DVT but she is requesting referral to orthopedics.  Again told her this is not appropriate therapy for DVT but she is insistent.    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; No COLON present    Fibromyalgia     Gastroesophageal reflux disease     Insomnia     Seasonal allergic rhinitis        Past Surgical History:     Past Surgical History:   Procedure Laterality Date    APPENDECTOMY      CHOLECYSTECTOMY      Double Barrel Enterostomy  2013    for SBO    EGD, COLONOSCOPY N/A 08/19/2019    Procedure: EGD, COLONOSCOPY;  Surgeon: Gwenith Spitz, MD;  Location: [facility];  Service: Gastroenterology;  Laterality: N/A;    Enterostomy reversal  2014    double barrel small bowel ostomies reanastomosed    ILEOSTOMY  2011     for SBO after colectomy    ILEOSTOMY CLOSURE  2012    multiple times    LAPAROTOMY, COLECTOMY, TOTAL  2011    only a rectum remains    OVARIAN CYST SURGERY  2011-2016    several    SALPINGECTOMY, OPEN Right     2011       Family History:     Family History   Problem Relation Age of Onset    Hypertension Mother     Diabetes Mother     Leukemia Father     Liver disease Maternal Grandfather     Hyperlipidemia Paternal Grandfather        Social History:     Social History     Tobacco Use   Smoking Status Current Every Day Smoker    Packs/day: 1.00    Years: 20.00    Pack years: 20.00    Types: Cigarettes   Smokeless Tobacco Never Used     Social History     Substance and Sexual Activity  Alcohol Use Never     Social History     Substance and Sexual Activity   Drug Use Not Currently    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 Authorizing Provider   acetaminophen (TYLENOL) 325 MG tablet Take 2 tablets (650 mg total) by mouth every 6 (six) hours as needed for Pain 10/10/19  Yes Fadia, Ankur S, MD   albuterol sulfate HFA (PROVENTIL) 108 (90 Base) MCG/ACT inhaler Inhale 2 puffs into the lungs every 4 (four) hours as needed for Wheezing or Shortness of Breath 12/13/18 12/13/19 Yes Delisha Peaden T, NP   buprenorphine-naloxone (SUBOXONE) 8-2 MG Film Place 1 tablet under the tongue 2 (two) times daily   Yes [provider]    fluticasone-salmeterol (ADVAIR HFA) 230-21 MCG/ACT inhaler Inhale 2 puffs into the lungs 2 (two) times daily 12/13/18  Yes Matheus Spiker T, NP   hydrOXYzine (ATARAX) 25 MG tablet Take 50 mg by mouth as needed    07/18/19  Yes [provider]   naloxone (NARCAN) 4 MG/0.1ML nasal spray 1 spray intranasally. If pt does not respond or relapses into respiratory depression call 911. Give additional doses every 2-3 min. 03/10/19  Yes Arther Abbott, MD   tiZANidine (ZANAFLEX) 4 MG tablet Take 1 tablet (4 mg total) by mouth every 8 (eight) hours as needed (muscle spasm) 06/02/19  Yes Mylynn Dinh T, NP   topiramate (TOPAMAX) 25 MG tablet Take 25 mg by mouth every 12 (twelve) hours      Yes [provider]   traZODone (DESYREL) 300 MG tablet Take 300 mg by mouth nightly   Yes [provider]   venlafaxine (EFFEXOR-XR) 150 MG 24 hr capsule Take 150 mg by mouth daily   Yes [provider]   apixaban (Eliquis) 5 MG Take 1 tablet (5 mg total) by mouth every 12 (twelve) hours 11/10/19   Marja Kays T, NP   benzonatate (TESSALON) 200 MG capsule Take 1 capsule (200 mg total) by mouth 3 (three) times daily as needed for Cough 11/28/19   Chucky May, MD   clonazePAM (KlonoPIN) 0.5 MG tablet Take 0.25 mg by mouth daily    11/07/19   [provider]   pregabalin (LYRICA) 300 MG capsule Take 300 mg by mouth 2 (two) times daily 11/10/19   [provider]   promethazine (PHENERGAN) 12.5 MG tablet Take 1 tablet (12.5 mg total) by mouth every 8 (eight) hours as needed for Nausea 11/10/19   Marja Kays T, NP       Review of Systems:      14 system review negative except as noted in the HPI.    Physical Exam:     Vitals:  10/13/19 1442   BP: 120/75   Pulse: 96   Temp: (!) 96.1 F (35.6 C)   SpO2: 95%     Body mass index is 23.96 kg/m.    General:  no acute distress. Pleasant and well groomed.  HEENT: Pupils equal and round, sclera anicteric, normocephalic, edentulous   Neck:  supple, no lymphadenopathy, no thyromegaly, no JVD  Cardiovascular: regular rate and rhythm, no murmurs  Lungs: clear to auscultation bilaterally, without wheezing, rhonchi, or rales  Extremities: 2+ edema right upper arm  Musculoskeletal: Gait WNL. No swelling, redness of joints.   Neuro:  alert and oriented.   Skin: no rashes or lesions noted.  Psychiatric/Behavioral: appropriate affect, mood and behavior.    Assessment:     1. Acute deep vein thrombosis (DVT) of brachial vein of right upper extremity    2. Nontraumatic incomplete tear of right rotator cuff        Plan:   Patient Instructions   Referral placed to orthopedics per your request.   Continue Eliquis as prescribed.      Deep Vein Thrombosis (DVT)  Deep vein thrombosis (DVT) occurs when a blood clot (thrombus) forms in a deep vein. This happens most often in the leg. It can also happen in the arms or other parts of the body. A part of the clot called an embolus can break off and travel to the lungs. When this happens, its called a pulmonary embolism (PE). PE is a medical emergency. It can cut off blood flow and lead to death. Both DVT and PE are closely related. Together, they are often referred to by the term venous thromboembolism (VTE).     Risk factors for DVT  Anything that slows blood flow, injures the lining of a vein, or increases blood clotting can make you more prone to having DVT. This includes the following:    Long periods without movement (such as when sitting for many hours at a time or when recovering from major surgery or illness)   Estrogen (female hormone) therapy, such as hormone replacement therapy (HRT) or birth control pills   Fractured hip or leg   Major surgery or joint replacement   Major trauma or spinal cord injury   Cancer   Family history   Excess weight or obesity   Smoking   Older age  Symptoms  DVT does not always cause symptoms. When symptoms do occur, they may appear around the site of the DVT, such as in the  leg. Possible symptoms include:    Swelling   Pain   Warmth   Redness   Tenderness  Home care   You were likely prescribed blood thinners (anticoagulants). They may be given as pills (oral) or shots (injections). Follow all instructions when using these medicines. Note: Don't take blood thinners with other medicines, herbal remedies, or supplements without talking to your provider first. Certain medicines or products can affect how blood thinners work.   Follow your providers instructions about activity and rest.   If support or compression stockings are prescribed, wear them as directed. These may help improve blood flow in the legs.   When sitting or lying down, move your ankles, toes and knees often. This may also help improve blood flow in the legs.    Follow-up care  Follow up with your healthcare provider, or as advised. If imaging tests were done, they may need further review by a doctor. You will be told of any new findings that  may affect your care.   When to seek medical advice  Call your healthcare provider right away if any of these occur:   New or increased swelling, pain, tenderness, warmth, or redness, in the leg, arm, or other area   Blood in the urine   Bleeding with bowel movements  Call 911  Call 911if any of these occur:    Bleeding from the nose, gums, a cut, or vagina   Heavy or uncontrolled bleeding   Trouble breathing   Chest pain or discomfort that worsens with deep breathing or coughing   Coughing (may cough up blood)   Fast heartbeat   Sweating   Anxiety   Lightheadedness, dizziness, or fainting  [employer] last reviewed this educational content on 04/23/2016   2000-2021 [employer], [place]. All rights reserved. This information is not intended as a substitute for professional medical care. Always follow your healthcare professional's instructions.             Requested Prescriptions      No prescriptions requested or ordered in this encounter        Patient was  counseled on possible medicine side effects which may include rash, swelling and/or stomach upset.  Patient was instructed to notify me or the ER if they experience problems.    Orders Placed This Encounter   Procedures    Ambulatory referral to Orthopedic Surgery     Referral Priority:   Routine     Referral Type:   Consultation     Referral Reason:   Specialty Services Required     Requested Specialty:   Orthopaedic Surgery     Number of Visits Requested:   1        Before leaving the office, the patient was informed of all ordered diagnostic tests and consults.     If you have not heard back from [place] about test results in 14 days, please call the office at [phone].    Signed by: Kathi Simpers, NP, NP    Supervising Doctor: Rogelio Seen, MD    The patient's electronic medical record was reviewed, any changes in the past medical history, past surgical history, medications, diagnostic tests were noted, and the record was updated accordingly.     Discussed option available for my chart which provides electronic access to diagnostic results.

## 2019-12-12 NOTE — Patient Instructions (Signed)
Referral placed to orthopedics per your request.   Continue Eliquis as prescribed.      Deep Vein Thrombosis (DVT)  Deep vein thrombosis (DVT) occurs when a blood clot (thrombus) forms in a deep vein. This happens most often in the leg. It can also happen in the arms or other parts of the body. A part of the clot called an embolus can break off and travel to the lungs. When this happens, its called a pulmonary embolism (PE). PE is a medical emergency. It can cut off blood flow and lead to death. Both DVT and PE are closely related. Together, they are often referred to by the term venous thromboembolism (VTE).     Risk factors for DVT  Anything that slows blood flow, injures the lining of a vein, or increases blood clotting can make you more prone to having DVT. This includes the following:    Long periods without movement (such as when sitting for many hours at a time or when recovering from major surgery or illness)   Estrogen (female hormone) therapy, such as hormone replacement therapy (HRT) or birth control pills   Fractured hip or leg   Major surgery or joint replacement   Major trauma or spinal cord injury   Cancer   Family history   Excess weight or obesity   Smoking   Older age  Symptoms  DVT does not always cause symptoms. When symptoms do occur, they may appear around the site of the DVT, such as in the leg. Possible symptoms include:    Swelling   Pain   Warmth   Redness   Tenderness  Home care   You were likely prescribed blood thinners (anticoagulants). They may be given as pills (oral) or shots (injections). Follow all instructions when using these medicines. Note: Don't take blood thinners with other medicines, herbal remedies, or supplements without talking to your provider first. Certain medicines or products can affect how blood thinners work.   Follow your providers instructions about activity and rest.   If support or compression stockings are prescribed, wear them as  directed. These may help improve blood flow in the legs.   When sitting or lying down, move your ankles, toes and knees often. This may also help improve blood flow in the legs.    Follow-up care  Follow up with your healthcare provider, or as advised. If imaging tests were done, they may need further review by a doctor. You will be told of any new findings that may affect your care.   When to seek medical advice  Call your healthcare provider right away if any of these occur:   New or increased swelling, pain, tenderness, warmth, or redness, in the leg, arm, or other area   Blood in the urine   Bleeding with bowel movements  Call 911  Call 911if any of these occur:    Bleeding from the nose, gums, a cut, or vagina   Heavy or uncontrolled bleeding   Trouble breathing   Chest pain or discomfort that worsens with deep breathing or coughing   Coughing (may cough up blood)   Fast heartbeat   Sweating   Anxiety   Lightheadedness, dizziness, or fainting  StayWell last reviewed this educational content on 04/23/2016   2000-2021 [employer], [place]. All rights reserved. This information is not intended as a substitute for professional medical care. Always follow your healthcare professional's instructions.

## 2019-12-13 ENCOUNTER — Encounter (RURAL_HEALTH_CENTER): Payer: Self-pay | Admitting: Nurse Practitioner

## 2019-12-13 NOTE — Patient Instructions (Signed)
Complications of Deep Vein Thrombosis   Deep vein thrombosis (DVT) is a condition involving the formation of a blood clot orthrombusin a deep vein. One may develop in a large vein deep inside the leg, arm, or other part of the body. Complicationsfromdeep vein thrombosiscan be very serious.They caninclude pulmonary embolism (PE),chronic venous insufficiency, and post-thrombotic syndrome.   You may hear healthcare providers use the termvenous thromboembolism (VTE)to describe DVT and PE.They use the term VTE because the two conditions are very closely related. And, because their prevention and treatment areclosely related.   Pulmonary embolism   Pulmonary embolism (PE) happens whenpart of the clot, called anembolus,separates from the vein. It travels to the lungs and cuts off theflow of blood. A PE may develop quickly. It's a medical emergency and may cause death.        Call 911  Call 911 if you have symptoms of a blood clot in the lungs. Symptoms may include:    Chest pain   Trouble breathing or sudden shortness of breath   Coughing (may cough up blood)   Fainting   Fast heartbeat   Sweating  You may have bleeding if you take medicine to help prevent blood clots. Call 911 if you have heavy or uncontrolled bleeding.   When to call your healthcare provider  Call your healthcare provider if you have symptoms of a blood clot. The symptoms include:    Swelling   Pain   Rednessin your leg, arm, or other area  Call your healthcare provider if you have signs or symptoms of bleeding, like blood in the urine, bleeding with bowel movements, or bleeding from the nose, gums, a cut, or vagina.   Chronic venous insufficiency and post-thrombotic syndrome   Two other complications of deep vein thrombosis are chronic venous insufficiency and post-thrombotic syndrome.   Chronic venous insufficiency may happen following deep vein thrombosis of a leg vein. It means that a vein no longer works as well. It's  a long-term condition where blood stays in the vein instead of flowing back to the heart. Pain and swelling in the leg are common symptoms.   Post-thrombotic syndromemay also happen following deep vein thrombosis of a leg vein. It's a long-term problem with pain, swelling, and redness. Ulcers and sores can also happen if the condition is not treated early. These complications and associated symptoms may make it hard to walk and take part in daily activities.   StayWell last reviewed this educational content on 10/23/2017   2000-2021 [employer] All rights reserved. This information is not intended as a substitute for professional medical care. Always follow your healthcare professional's instructions.

## 2019-12-13 NOTE — Progress Notes (Signed)
PROGRESS NOTE      Patient Name: [name],[name]  Primary Care Physician: Kathi Simpers, NP      History of Presenting Illness:   [name] is a 34 y.o. female who presents to the office with   Chief Complaint   Patient presents with    Blood Clots     DVT:  Presents today for a follow-up on 10/10/2014 for a DVT of the right upper arm pain. She denies any IV drug use or trauma.  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  She reports that she is in severe pain and is going to need something to control her pain.  Discussed how she is currently on a pain contract with her Suboxone therapy and this is not an appropriate treatment even if she was not on a pain contract.  She is currently prescribed Eliquis for treatment of her DVT and is requesting a refill of this medication today.  Discussed in length that DVTs take a long time to resolve and ways to prevent further DVTs.    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; No COLON present    Fibromyalgia     Gastroesophageal reflux disease     Insomnia     Seasonal allergic rhinitis        Past Surgical History:     Past Surgical History:   Procedure Laterality Date    APPENDECTOMY      CHOLECYSTECTOMY      Double Barrel Enterostomy  2013    for SBO    EGD, COLONOSCOPY N/A 08/19/2019    Procedure: EGD, COLONOSCOPY;  Surgeon: Gwenith Spitz, MD;  Location: [facility];  Service: Gastroenterology;  Laterality: N/A;    Enterostomy reversal  2014    double barrel small bowel ostomies reanastomosed    ILEOSTOMY  2011    for SBO after colectomy     ILEOSTOMY CLOSURE  2012    multiple times    LAPAROTOMY, COLECTOMY, TOTAL  2011    only a rectum remains    OVARIAN CYST SURGERY  2011-2016    several    SALPINGECTOMY, OPEN Right     2011       Family History:     Family History   Problem Relation Age of Onset    Hypertension Mother     Diabetes Mother     Leukemia Father     Liver disease Maternal Grandfather     Hyperlipidemia Paternal Grandfather        Social History:     Social History     Tobacco Use   Smoking Status Current Every Day Smoker    Packs/day: 1.00    Years: 20.00    Pack years: 20.00    Types: Cigarettes   Smokeless Tobacco Never Used     Social History     Substance and Sexual Activity   Alcohol Use Never     Social History  Substance and Sexual Activity   Drug Use Not Currently    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 Authorizing Provider   acetaminophen (TYLENOL) 325 MG tablet Take 2 tablets (650 mg total) by mouth every 6 (six) hours as needed for Pain 10/10/19  Yes Fadia, Ankur S, MD   albuterol sulfate HFA (PROVENTIL) 108 (90 Base) MCG/ACT inhaler Inhale 2 puffs into the lungs every 4 (four) hours as needed for Wheezing or Shortness of Breath 12/13/18 12/13/19 Yes Maritza Goldsborough T, NP   buprenorphine-naloxone (SUBOXONE) 8-2 MG Film Place 1 tablet under the tongue 2 (two) times daily   Yes [provider]   clonazePAM (KlonoPIN) 0.5 MG tablet Take 0.25  mg by mouth daily    11/07/19  Yes [provider]   fluticasone-salmeterol (ADVAIR HFA) 230-21 MCG/ACT inhaler Inhale 2 puffs into the lungs 2 (two) times daily 12/13/18  Yes Areesha Dehaven T, NP   naloxone (NARCAN) 4 MG/0.1ML nasal spray 1 spray intranasally. If pt does not respond or relapses into respiratory depression call 911. Give additional doses every 2-3 min. 03/10/19  Yes Arther Abbott, MD   promethazine (PHENERGAN) 12.5 MG tablet Take 1 tablet (12.5 mg total) by mouth every 8 (eight) hours as needed for Nausea 11/10/19  Yes Charyl Minervini T, NP   tiZANidine (ZANAFLEX) 4 MG tablet Take 1 tablet (4 mg total) by mouth every 8 (eight) hours as needed (muscle spasm) 06/02/19  Yes Reata Petrov T, NP   topiramate (TOPAMAX) 25 MG tablet Take 25 mg by mouth every 12 (twelve) hours      Yes [provider]   traZODone (DESYREL) 300 MG tablet Take 300 mg by mouth nightly   Yes [provider]   venlafaxine (EFFEXOR-XR) 150 MG 24 hr capsule Take 150 mg by mouth daily   Yes [provider]   apixaban (Eliquis) 5 MG Take 1 tablet (5 mg total) by mouth every 12 (twelve) hours 11/10/19   Kye Hedden T, NP   benzonatate (TESSALON) 200 MG capsule Take 1 capsule (200 mg total) by mouth 3 (three) times daily as needed for Cough 11/28/19   Chucky May, MD   hydrOXYzine (ATARAX) 25 MG tablet Take 50 mg by mouth as needed    07/18/19   [provider]   pregabalin (LYRICA) 300 MG capsule Take 300 mg by mouth 2 (two) times daily 11/10/19   [provider]       Review of Systems:      14 system review negative except as noted in the HPI.    Physical Exam:     Vitals:    11/10/19 1353   BP: 120/68   Pulse: 100  Resp: 18   Temp: 98.5 F (36.9 C)   SpO2: 97%     Body mass index is 23.16 kg/m.    General:  no acute distress. Pleasant and well groomed.  HEENT: Pupils equal and round, sclera anicteric, normocephalic, edentulous   Neck: supple, no lymphadenopathy, no  thyromegaly, no JVD  Cardiovascular: regular rate and rhythm, no murmurs  Lungs: clear to auscultation bilaterally, without wheezing, rhonchi, or rales  Extremities: 1+ edema right upper arm  Musculoskeletal: Gait WNL. No swelling, redness of joints.   Neuro:  alert and oriented.   Skin: no rashes or lesions noted.  Psychiatric/Behavioral: appropriate affect, mood and behavior.       Assessment:     1. Blood clot in vein    2. Nausea    3. Need for prophylactic vaccination and inoculation against influenza        Plan:   Patient Instructions     Complications of Deep Vein Thrombosis   Deep vein thrombosis (DVT) is a condition involving the formation of a blood clot orthrombusin a deep vein. One may develop in a large vein deep inside the leg, arm, or other part of the body. Complicationsfromdeep vein thrombosiscan be very serious.They caninclude pulmonary embolism (PE),chronic venous insufficiency, and post-thrombotic syndrome.   You may hear healthcare providers use the termvenous thromboembolism (VTE)to describe DVT and PE.They use the term VTE because the two conditions are very closely related. And, because their prevention and treatment areclosely related.   Pulmonary embolism   Pulmonary embolism (PE) happens whenpart of the clot, called anembolus,separates from the vein. It travels to the lungs and cuts off theflow of blood. A PE may develop quickly. It's a medical emergency and may cause death.        Call 911  Call 911 if you have symptoms of a blood clot in the lungs. Symptoms may include:    Chest pain   Trouble breathing or sudden shortness of breath   Coughing (may cough up blood)   Fainting   Fast heartbeat   Sweating  You may have bleeding if you take medicine to help prevent blood clots. Call 911 if you have heavy or uncontrolled bleeding.   When to call your healthcare provider  Call your healthcare provider if you have symptoms of a blood clot. The symptoms include:     Swelling   Pain   Rednessin your leg, arm, or other area  Call your healthcare provider if you have signs or symptoms of bleeding, like blood in the urine, bleeding with bowel movements, or bleeding from the nose, gums, a cut, or vagina.   Chronic venous insufficiency and post-thrombotic syndrome   Two other complications of deep vein thrombosis are chronic venous insufficiency and post-thrombotic syndrome.   Chronic venous insufficiency may happen following deep vein thrombosis of a leg vein. It means that a vein no longer works as well. It's a long-term condition where blood stays in the vein instead of flowing back to the heart. Pain and swelling in the leg are common symptoms.   Post-thrombotic syndromemay also happen following deep vein thrombosis of a leg vein. It's a long-term problem with pain, swelling, and redness. Ulcers and sores can also happen if the condition is not treated early. These complications and associated symptoms may make it hard to walk and take part in daily activities.   StayWell last reviewed this educational content on 10/23/2017   2000-2021 [employer], [place]. All rights reserved.  This information is not intended as a substitute for professional medical care. Always follow your healthcare professional's instructions.             Requested Prescriptions     Signed Prescriptions Disp Refills    promethazine (PHENERGAN) 12.5 MG tablet 20 tablet 0     Sig: Take 1 tablet (12.5 mg total) by mouth every 8 (eight) hours as needed for Nausea    apixaban (Eliquis) 5 MG 60 tablet 5     Sig: Take 1 tablet (5 mg total) by mouth every 12 (twelve) hours        Patient was counseled on possible medicine side effects which may include rash, swelling and/or stomach upset.  Patient was instructed to notify me or the ER if they experience problems.    Orders Placed This Encounter   Procedures    Flu vacc QUAD PRES FREE 3 YRS & GREATER     Dose=0.31ml, Route = IM    Factor V Leiden      Standing Status:   Future     Standing Expiration Date:   11/09/2020     Order Specific Question:   Release to patient     Answer:   Immediate        Before leaving the office, the patient was informed of all ordered diagnostic tests and consults.     If you have not heard back from [place] about test results in 14 days, please call the office at [phone].    Signed by: Kathi Simpers, NP, NP    Supervising Doctor: Rogelio Seen, MD    The patient's electronic medical record was reviewed, any changes in the past medical history, past surgical history, medications, diagnostic tests were noted, and the record was updated accordingly.     Discussed option available for my chart which provides electronic access to diagnostic results.

## 2019-12-15 ENCOUNTER — Ambulatory Visit
Admission: RE | Admit: 2019-12-15 | Discharge: 2019-12-15 | Disposition: A | Discharge status: Home / Self Care | Payer: Medicare Managed Care Other | Source: Ambulatory Visit | Attending: Hematology & Oncology | Admitting: Hematology & Oncology

## 2019-12-15 ENCOUNTER — Ambulatory Visit
Admission: RE | Admit: 2019-12-15 | Discharge: 2019-12-15 | Disposition: A | Discharge status: Home / Self Care | Payer: Medicare Managed Care Other | Source: Ambulatory Visit | Attending: Family Medicine | Admitting: Family Medicine

## 2019-12-15 DIAGNOSIS — D72829 Elevated white blood cell count, unspecified: Secondary | ICD-10-CM

## 2019-12-15 DIAGNOSIS — J189 Pneumonia, unspecified organism: Secondary | ICD-10-CM | POA: Insufficient documentation

## 2019-12-15 DIAGNOSIS — R6889 Other general symptoms and signs: Secondary | ICD-10-CM | POA: Insufficient documentation

## 2019-12-15 LAB — CBC AND DIFFERENTIAL
Basophils %: 1.1 % (ref 0.0–3.0)
Basophils Absolute: 0.1 10*3/uL (ref 0.0–0.3)
Eosinophils %: 2.6 % (ref 0.0–7.0)
Eosinophils Absolute: 0.3 10*3/uL (ref 0.0–0.8)
Hematocrit: 42.6 % (ref 36.0–48.0)
Hemoglobin: 14.3 gm/dL (ref 12.0–16.0)
Lymphocytes Absolute: 2.4 10*3/uL (ref 0.6–5.1)
Lymphocytes: 21.9 % (ref 15.0–46.0)
MCH: 34 pg (ref 28–35)
MCHC: 34 gm/dL (ref 31–36)
MCV: 102 fL — ABNORMAL HIGH (ref 80–100)
MPV: 7.6 fL (ref 6.0–10.0)
Monocytes Absolute: 1 10*3/uL (ref 0.1–1.7)
Monocytes: 9.4 % (ref 3.0–15.0)
Neutrophils %: 65 % (ref 42.0–78.0)
Neutrophils Absolute: 7.2 10*3/uL (ref 1.7–8.6)
PLT CT: 319 10*3/uL (ref 130–440)
RBC: 4.17 10*6/uL (ref 3.80–5.00)
RDW: 13.9 % (ref 10.5–14.5)
WBC: 11 10*3/uL (ref 4.0–11.0)

## 2019-12-15 LAB — ANTITHROMBIN III LEVEL: Antithrombin 3: 114.1 % (ref 80.0–120.0)

## 2019-12-16 LAB — BETA-2 GLYCOPROTEIN 1 AB,G/M
Beta 2 GP1 Ab IgG, S: <9.4 U/mL
Beta 2 GP1 Ab IgM, S: <9.4 U/mL

## 2019-12-16 LAB — PROTEIN C-FUNCTIONAL, PLASMA: Protein C Activity: 131 % (ref 70–180)

## 2019-12-17 LAB — CARDIOLIPIN ANTIBODIES (IGG,M)
Cardiolipin IgG Antibody: <2 GPL-U/mL (ref <20.0)
Cardiolipin IgM Antibody: <2 MPL-U/mL (ref <20.0)

## 2019-12-17 LAB — PROTEIN S, TOTAL AND FREE
Protein S Activity: 66 % (ref 60–140)
Protein S Antigen Free: 78 % normal (ref 50–147)
Protein S Antigen Total: 90 % normal (ref 70–140)

## 2019-12-17 LAB — PROTEIN C ANTIGEN: Protein C Antigen: 105 % (ref 70–140)

## 2019-12-18 LAB — REFLEX - DRVVT CONFIRMATION: dRVVT Confirmation: NEGATIVE

## 2019-12-18 LAB — LUPUS ANTICOAGULANT EVAL W-RFL

## 2019-12-19 ENCOUNTER — Encounter: Payer: Self-pay | Admitting: Family

## 2019-12-19 ENCOUNTER — Ambulatory Visit: Admission: RE | Admit: 2019-12-19 | Payer: Medicare Managed Care Other | Source: Ambulatory Visit

## 2019-12-19 DIAGNOSIS — I82611 Acute embolism and thrombosis of superficial veins of right upper extremity: Secondary | ICD-10-CM

## 2019-12-20 LAB — PROTHROMBIN GENE MUTATION: Prothrombin (Factor II): NEGATIVE

## 2019-12-20 LAB — FACTOR V LEIDEN: Factor V (Leiden) Mutation: NEGATIVE

## 2019-12-24 ENCOUNTER — Ambulatory Visit (RURAL_HEALTH_CENTER): Payer: Medicare Managed Care Other

## 2019-12-24 ENCOUNTER — Encounter (RURAL_HEALTH_CENTER): Payer: Self-pay | Admitting: Nurse Practitioner

## 2019-12-24 ENCOUNTER — Ambulatory Visit: Payer: Medicare Managed Care Other | Attending: Nurse Practitioner | Admitting: Nurse Practitioner

## 2019-12-24 VITALS — Resp 20 | Ht 65.0 in | Wt 138.0 lb

## 2019-12-24 DIAGNOSIS — R6889 Other general symptoms and signs: Secondary | ICD-10-CM

## 2019-12-24 MED ORDER — ONDANSETRON HCL 4 MG PO TABS
4.0000 mg | ORAL_TABLET | Freq: Every day | ORAL | 0 refills | Status: DC | PRN
Start: 2019-12-24 — End: 2020-05-27

## 2019-12-24 NOTE — Patient Instructions (Addendum)
Covid and Influenza test ordered. Will call when the results are available.  Quarantine until results are available.  Zofran ordered for nausea. Take as directed.  Follow up as needed.

## 2019-12-24 NOTE — Progress Notes (Signed)
PROGRESS NOTE      Patient Name: Poulton,Fran MARIE  Primary Care Physician: Kathi Simpers, NP      History of Presenting Illness:   Yatzil Kylii Ennis is a 34 y.o. female who presents to the office with flu like symptoms. She has headache, abdominal pain. Body aches, nausea, emesis, diarrhea, and "just not felling well."  Chief Complaint   Patient presents with    Headache     since last Friday    Abdominal Pain    Nausea    Emesis    Diarrhea   COVID vs. Influenza  Presents for evaluation of influenza like symptoms. Symptoms include chills, headache, myalgias, productive cough, nausea, vomiting and fever and have been present for 5 days. She has tried to alleviate the symptoms with acetaminophen with no relief. High risk factors for influenza complications: none.    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; No COLON present    Fibromyalgia     Gastroesophageal reflux disease     Insomnia     Seasonal allergic rhinitis        Past Surgical History:     Past Surgical History:   Procedure Laterality Date    APPENDECTOMY      CHOLECYSTECTOMY      Double Barrel Enterostomy  2013    for SBO    EGD, COLONOSCOPY N/A 08/19/2019    Procedure: EGD, COLONOSCOPY;  Surgeon: Gwenith Spitz, MD;  Location: [facility];  Service: Gastroenterology;  Laterality: N/A;    Enterostomy reversal  2014    double barrel small bowel ostomies reanastomosed    ILEOSTOMY  2011    for SBO after colectomy    ILEOSTOMY CLOSURE  2012    multiple times    LAPAROTOMY, COLECTOMY, TOTAL  2011    only a rectum remains    OVARIAN CYST SURGERY  2011-2016    several     SALPINGECTOMY, OPEN Right     2011       Family History:     Family History   Problem Relation Age of Onset    Hypertension Mother     Diabetes Mother     Leukemia Father     Liver disease Maternal Grandfather     Hyperlipidemia Paternal Grandfather        Social History:     Social History     Tobacco Use   Smoking Status Current Every Day Smoker    Packs/day: 1.00    Years: 20.00    Pack years: 20.00    Types: Cigarettes   Smokeless Tobacco Never Used     Social History     Substance and Sexual Activity   Alcohol Use Never     Social History     Substance and Sexual Activity   Drug Use Not Currently    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 Authorizing Provider   acetaminophen (TYLENOL) 325 MG tablet Take 2 tablets (650 mg total) by mouth every 6 (six) hours as needed for Pain 10/10/19  Yes Fadia, Ankur S, MD   benzonatate (TESSALON) 200 MG capsule Take 1 capsule (200 mg total) by mouth 3 (three) times daily as needed for Cough 11/28/19  Yes Chucky May, MD   buprenorphine-naloxone (SUBOXONE) 8-2 MG Film Place 1 tablet under the tongue 2 (two) times daily   Yes [provider]   clonazePAM (KlonoPIN) 0.5 MG tablet Take 0.25 mg by mouth daily    11/07/19  Yes [provider]   fluticasone-salmeterol (ADVAIR HFA) 230-21 MCG/ACT inhaler Inhale 2 puffs into the lungs 2 (two) times daily 12/13/18  Yes Sharp, Skyler T, NP    hydrOXYzine (ATARAX) 25 MG tablet Take 50 mg by mouth as needed    07/18/19  Yes [provider]   naloxone (NARCAN) 4 MG/0.1ML nasal spray 1 spray intranasally. If pt does not respond or relapses into respiratory depression call 911. Give additional doses every 2-3 min. 03/10/19  Yes Arther Abbott, MD   pregabalin (LYRICA) 300 MG capsule Take 300 mg by mouth 2 (two) times daily 11/10/19  Yes [provider]   promethazine (PHENERGAN) 12.5 MG tablet Take 1 tablet (12.5 mg total) by mouth every 8 (eight) hours as needed for Nausea 11/10/19  Yes Sharp, Skyler T, NP   tiZANidine (ZANAFLEX) 4 MG tablet Take 1 tablet (4 mg total) by mouth every 8 (eight) hours as needed (muscle spasm) 06/02/19  Yes Sharp, Skyler T, NP   topiramate (TOPAMAX) 25 MG tablet Take 25 mg by mouth every 12 (twelve) hours      Yes [provider]   traZODone (DESYREL) 300 MG tablet Take 300 mg by mouth nightly   Yes [provider]   venlafaxine (EFFEXOR-XR) 150 MG 24 hr capsule Take 150 mg by mouth daily   Yes [provider]   Xarelto 20 MG Tab Take 10 tablets by mouth daily 12/09/19  Yes [provider]   apixaban (Eliquis) 5 MG Take 1 tablet (5 mg total) by mouth every 12 (twelve) hours 11/10/19 12/24/19  Marja Kays T, NP       Review of Systems:    14 point review of systems was negative except for what was noted in the HPI.     Physical Exam:     Vitals:    12/24/19 1059   Resp: 20     Body mass index is 22.96 kg/m.  General:  no acute distress. Pleasant and well groomed.  Lungs: Effort normal, no use accessory muscles.  Neuro:  alert and oriented.   Psychiatric/Behavioral: appropriate affect, mood and behavior..     Assessment:     1. Flu-like symptoms        Plan:  Patient Instructions   Covid and Influenza test ordered. Will call when the results are available.  Quarantine until results are available.  Zofran ordered for nausea. Take as directed.  Follow up as needed.        Requested Prescriptions     Signed Prescriptions Disp Refills    ondansetron (Zofran) 4 MG tablet 10 tablet 0     Sig: Take 1 tablet (4 mg total) by mouth daily as needed for Nausea        Patient was counseled on possible medicine side effects which may include rash, swelling and/or stomach upset.  Patient was instructed to notify me or the ER if they experience problems.    Orders Placed This Encounter   Procedures    SARS-CoV-2 Assay ([employer] System(TM))     Order Specific Question:   Specimen     Answer:   Nasopharyngeal Swab     Order Specific Question:   Does patient have symptoms related to condition of interest?     Answer:   Y     Order Specific Question:   Symptom Date of Onset if Known     Answer:   Date of Onset Known     Order Specific Question:   Date of Onset     Answer:   12/19/2019     Order Specific Question:   Does patient reside in congregate care setting?     Answer:   N     Order Specific Question:   Is patient employed in a healthcare setting?     Answer:   N     Order Specific Question:   Release to patient     Answer:   Immediate    [facility] Influenza A/B POCT     Order Specific Question:   Release to patient     Answer:   Immediate      This visit was conducted with the use of interactive audio/video telecommunications that permitted real-time communication between [employer] and myself. [name] consented to practice patient and received services at home, while I was located at [facility]. [facility].  Before leaving the office, the patient was informed of all ordered diagnostic tests and consults.     If you have not heard back from [place] about test results in 14 days, please call the office at [phone].    Signed by: York Cerise, FNP, NP    Supervising Doctor: Rogelio Seen, MD    The patient's electronic medical record was reviewed, any changes in the past medical history, past surgical history, medications, diagnostic tests were noted, and the  record was updated accordingly.     Discussed option available for my chart which provides electronic access to diagnostic results.

## 2019-12-30 ENCOUNTER — Ambulatory Visit: Payer: Medicare Managed Care Other | Attending: Nurse Practitioner | Admitting: Nurse Practitioner

## 2019-12-30 ENCOUNTER — Encounter (RURAL_HEALTH_CENTER): Payer: Self-pay | Admitting: Nurse Practitioner

## 2019-12-30 VITALS — BP 135/87 | HR 80 | Ht 65.0 in | Wt 138.0 lb

## 2019-12-30 DIAGNOSIS — R0602 Shortness of breath: Secondary | ICD-10-CM

## 2019-12-30 DIAGNOSIS — R059 Cough, unspecified: Secondary | ICD-10-CM

## 2019-12-30 DIAGNOSIS — M7541 Impingement syndrome of right shoulder: Secondary | ICD-10-CM

## 2019-12-30 DIAGNOSIS — J189 Pneumonia, unspecified organism: Secondary | ICD-10-CM

## 2019-12-30 DIAGNOSIS — G894 Chronic pain syndrome: Secondary | ICD-10-CM

## 2019-12-30 MED ORDER — TIZANIDINE HCL 4 MG PO TABS
4.0000 mg | ORAL_TABLET | Freq: Three times a day (TID) | ORAL | 0 refills | Status: DC | PRN
Start: 2019-12-30 — End: 2020-01-22

## 2019-12-30 MED ORDER — DOXYCYCLINE HYCLATE 100 MG PO CAPS
100.0000 mg | ORAL_CAPSULE | Freq: Two times a day (BID) | ORAL | 0 refills | Status: AC
Start: 2019-12-30 — End: 2020-01-09

## 2019-12-30 MED ORDER — PREGABALIN 300 MG PO CAPS
300.0000 mg | ORAL_CAPSULE | Freq: Every day | ORAL | 0 refills | Status: DC
Start: 2019-12-30 — End: 2020-01-26

## 2019-12-30 MED ORDER — PREGABALIN 150 MG PO CAPS
150.0000 mg | ORAL_CAPSULE | Freq: Every day | ORAL | 0 refills | Status: DC
Start: 2019-12-30 — End: 2020-01-26

## 2019-12-30 MED ORDER — GUAIFENESIN 100 MG/5ML PO LIQD
200.0000 mg | Freq: Three times a day (TID) | ORAL | 0 refills | Status: DC | PRN
Start: 2019-12-30 — End: 2020-01-26

## 2019-12-30 NOTE — Progress Notes (Signed)
PROGRESS NOTE      Patient Name: [name],[name]  Primary Care Physician: Kathi Simpers, NP      History of Presenting Illness:   [name] is a 34 y.o. female who presents to the office with   Chief Complaint   Patient presents with    Cough     Non-productive, S/P PNA  - ED on 11/28/19    Chest congestion     Cough  Patient complains of dyspnea, nasal congestion, nonproductive cough and wheezing. Symptoms began 3 weeks ago. Symptoms have been gradually worsening since that time.The cough is raspy and is aggravated by cold air, fumes and reclining position. Associated symptoms include: change in voice, postnasal drip, shortness of breath and wheezing. Patient does have a history of smoking. Patient has not had a previous chest x-ray.  She reports you presented to the emergency department and was told to follow-up with her primary care provider without any testing completed in the emergency department.  She reports she recently was diagnosed with pneumonia in November and does not feel that she has fully recovered.    This visit was conducted with the use of interactive audio/video telecommunications that permitted real-time communication between [employer] and myself. She consented to practice patient and received services at home, while I was located at [facility]. [facility]    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; No COLON present    Fibromyalgia     Gastroesophageal reflux disease     Insomnia     Seasonal allergic rhinitis        Past Surgical History:     Past Surgical History:   Procedure Laterality Date     APPENDECTOMY      CHOLECYSTECTOMY      Double Barrel Enterostomy  2013    for SBO    EGD, COLONOSCOPY N/A 08/19/2019    Procedure: EGD, COLONOSCOPY;  Surgeon: Gwenith Spitz, MD;  Location: [facility];  Service: Gastroenterology;  Laterality: N/A;    Enterostomy reversal  2014    double barrel small bowel ostomies reanastomosed    ILEOSTOMY  2011    for SBO after colectomy    ILEOSTOMY CLOSURE  2012    multiple times    LAPAROTOMY, COLECTOMY, TOTAL  2011    only a rectum remains    OVARIAN CYST SURGERY  2011-2016    several    SALPINGECTOMY, OPEN Right     2011       Family History:     Family History   Problem Relation Age of Onset    Hypertension Mother     Diabetes Mother     Leukemia Father     Liver disease Maternal Grandfather     Hyperlipidemia Paternal Grandfather        Social History:     Social History     Tobacco Use   Smoking Status Current Every Day Smoker    Packs/day: 1.00    Years: 20.00  Pack years: 20.00    Types: Cigarettes   Smokeless Tobacco Never Used     Social History     Substance and Sexual Activity   Alcohol Use Never     Social History     Substance and Sexual Activity   Drug Use Not Currently    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 Authorizing Provider   acetaminophen (TYLENOL) 325 MG tablet Take 2 tablets (650 mg total) by mouth every 6 (six) hours as needed for Pain 10/10/19  Yes Fadia,  Artis Delay, MD   clonazePAM (KlonoPIN) 0.5 MG tablet Take 0.25 mg by mouth daily    11/07/19  Yes [provider]   fluticasone-salmeterol (ADVAIR HFA) 230-21 MCG/ACT inhaler Inhale 2 puffs into the lungs 2 (two) times daily 12/13/18  Yes Brinnley Lacap T, NP   hydrOXYzine (ATARAX) 25 MG tablet Take 50 mg by mouth as needed    07/18/19  Yes [provider]   naloxone (NARCAN) 4 MG/0.1ML nasal spray 1 spray intranasally. If pt does not respond or relapses into respiratory depression call 911. Give additional doses every 2-3 min. 03/10/19  Yes Arther Abbott, MD   ondansetron (Zofran) 4 MG tablet Take 1 tablet (4 mg total) by mouth daily as needed for Nausea 12/24/19 12/23/20 Yes York Cerise, FNP   pregabalin (LYRICA) 150 MG capsule Take 1 capsule (150 mg total) by mouth daily 12/30/19  Yes Marja Kays T, NP   pregabalin (LYRICA) 150 MG capsule  12/24/19 12/30/19 Yes [provider]   doxycycline (VIBRAMYCIN) 100 MG capsule Take 1 capsule (100 mg total) by mouth 2 (two) times daily for 10 days 12/30/19 01/09/20  Marja Kays T, NP   guaifenesin (ROBITUSSIN) 100 MG/5ML liquid Take 10 mLs (200 mg total) by mouth 3 (three) times daily as needed for Cough 12/30/19   Jowanda Heeg T, NP   pregabalin (LYRICA) 300 MG capsule Take 1 capsule (300 mg total) by mouth daily 12/30/19   Marja Kays T, NP   promethazine (PHENERGAN) 12.5 MG tablet Take 1 tablet (12.5 mg total) by mouth every 8 (eight) hours as needed for Nausea 11/10/19   Marja Kays T, NP   tiZANidine (ZANAFLEX) 4 MG tablet Take 1 tablet (4 mg total) by mouth every 8 (eight) hours as needed (muscle spasm) 12/30/19   Marja Kays T, NP   topiramate (TOPAMAX) 25 MG tablet Take 25 mg by mouth every 12 (twelve) hours       [provider]   traZODone (DESYREL) 300 MG tablet Take 300 mg by mouth nightly    [provider]   venlafaxine (EFFEXOR-XR) 150 MG 24 hr capsule Take 150 mg by mouth daily  [provider]    Xarelto 20 MG Tab Take 10 tablets by mouth daily 12/09/19   [provider]   benzonatate (TESSALON) 200 MG capsule Take 1 capsule (200 mg total) by mouth 3 (three) times daily as needed for Cough 11/28/19 12/30/19  Chucky May, MD   buprenorphine-naloxone (SUBOXONE) 8-2 MG Film Place 1 tablet under the tongue 2 (two) times daily  12/30/19  [provider]   pregabalin (LYRICA) 300 MG capsule Take 300 mg by mouth 2 (two) times daily 11/10/19 12/30/19  [provider]   tiZANidine (ZANAFLEX) 4 MG tablet Take 1 tablet (4 mg total) by mouth every 8 (eight) hours as needed (muscle spasm) 06/02/19 12/30/19  Marja Kays T, NP       Review of Systems:      14 system review negative except as noted in the HPI.    Physical Exam:     Vitals:    12/30/19 1117   BP: 135/87   Pulse: 80     Body mass index is 22.96 kg/m.    General:  no acute distress. Pleasant and well groomed.  Neck: no JVD, no tracheal deviation  Lungs: Effort normal, no use accessory muscles.  Neuro:  alert and oriented.   Skin: no rashes or lesions noted  Psychiatric/Behavioral: appropriate affect, mood and behavior.    Assessment:     1. Pneumonia due to infectious organism, unspecified laterality, unspecified part of lung    2. Shortness of breath    3. Cough    4. Impingement syndrome of right shoulder    5. Chronic pain syndrome        Plan:   Patient Instructions   Pneumonia:   Continue to rest as much as possible until you feel stronger. Dont let yourself get overly tired when you go back to your activities.  Avoid cigarette smoke or other irritants to the lungs. Consider smoking cessation if you smoke as it lowers your ability to fight infection.  Continue to use Tylenol or Ibuprofen for pain/fever.  Drink plenty of fluids (water).  Elevate head of bed.  Continue all of your antibiotic medicine prescribed until it is all gone, even if you are feeling better.  Doxycycline 100 mg twice daily x 10 days.   Chest x-ray ASAP  - I will call you with your results.     Avoid acidic food and fluids, like orange juice or tomatoes.  Avoid milk while you have congestion.  Salt water gargle (1/2 teaspoon salt in 1 cup of warm water)or honey to soothe throat.  May use a humidifier.   Guaifenesin 5 mls three times a day as needed for cough. This medication will make you sleepy. Do not operate a vehicle or make decisions while on this medication. Keep locked up and out of reach of children.  Wash your hands frequently! This helps prevent the spread of germs.    Pain:  Refill of Lyrica and zanaflex completed today. Will follow-up with nancy in 3 weeks. This is not a guarantee that we will continue these medication we may need to refer you to pain management for higher level of care.             Requested Prescriptions     Signed Prescriptions Disp Refills    doxycycline (VIBRAMYCIN) 100 MG capsule 20 capsule 0     Sig: Take 1 capsule (100 mg total) by mouth 2 (two) times daily for 10 days  guaifenesin (ROBITUSSIN) 100 MG/5ML liquid 118 mL 0     Sig: Take 10 mLs (200 mg total) by mouth 3 (three) times daily as needed for Cough    pregabalin (LYRICA) 150 MG capsule 30 capsule 0     Sig: Take 1 capsule (150 mg total) by mouth daily    pregabalin (LYRICA) 300 MG capsule 30 capsule 0     Sig: Take 1 capsule (300 mg total) by mouth daily    tiZANidine (ZANAFLEX) 4 MG tablet 60 tablet 0     Sig: Take 1 tablet (4 mg total) by mouth every 8 (eight) hours as needed (muscle spasm)        Patient was counseled on possible medicine side effects which may include rash, swelling and/or stomach upset.  Patient was instructed to notify me or the ER if they experience problems.    Orders Placed This Encounter   Procedures    XR Chest PA and lateral     Standing Status:   Future     Standing Expiration Date:   12/29/2020     Order Specific Question:   Is the patient pregnant?     Answer:   No     Order Specific Question:   Reason for Exam:     Answer:    shortness of breath     Order Specific Question:   Release to patient     Answer:   Immediate        Before leaving the office, the patient was informed of all ordered diagnostic tests and consults.     If you have not heard back from [place] about test results in 14 days, please call the office at [phone].    Signed by: Kathi Simpers, NP, NP    Supervising Doctor: Rogelio Seen, MD    The patient's electronic medical record was reviewed, any changes in the past medical history, past surgical history, medications, diagnostic tests were noted, and the record was updated accordingly.     Discussed option available for my chart which provides electronic access to diagnostic results.

## 2019-12-30 NOTE — Patient Instructions (Signed)
Pneumonia:   Continue to rest as much as possible until you feel stronger. Dont let yourself get overly tired when you go back to your activities.  Avoid cigarette smoke or other irritants to the lungs. Consider smoking cessation if you smoke as it lowers your ability to fight infection.  Continue to use Tylenol or Ibuprofen for pain/fever.  Drink plenty of fluids (water).  Elevate head of bed.  Continue all of your antibiotic medicine prescribed until it is all gone, even if you are feeling better.  Doxycycline 100 mg twice daily x 10 days.   Chest x-ray ASAP - I will call you with your results.     Avoid acidic food and fluids, like orange juice or tomatoes.  Avoid milk while you have congestion.  Salt water gargle (1/2 teaspoon salt in 1 cup of warm water)or honey to soothe throat.  May use a humidifier.   Guaifenesin 5 mls three times a day as needed for cough. This medication will make you sleepy. Do not operate a vehicle or make decisions while on this medication. Keep locked up and out of reach of children.  Wash your hands frequently! This helps prevent the spread of germs.    Pain:  Refill of Lyrica and zanaflex completed today. Will follow-up with nancy in 3 weeks. This is not a guarantee that we will continue these medication we may need to refer you to pain management for higher level of care.

## 2019-12-31 ENCOUNTER — Other Ambulatory Visit (RURAL_HEALTH_CENTER): Payer: Self-pay | Admitting: Nurse Practitioner

## 2019-12-31 DIAGNOSIS — K912 Postsurgical malabsorption, not elsewhere classified: Secondary | ICD-10-CM

## 2020-01-02 ENCOUNTER — Telehealth (RURAL_HEALTH_CENTER): Payer: Self-pay

## 2020-01-02 NOTE — Telephone Encounter (Signed)
Patient called today w/ C/O severe Nausea and Vomiting for the past several days. Patient was seen via Telehealth on 12/30/19 and treated with Doxycycline 100mg  BID x10 days. Patient has been unable to obtain CXR due to transportation issues. Advised the patient since she is unable to keep anything down and with increase N&V to go to ED to be eval and treated. Patient verbalized understanding. Offered to call EMS for patient since she was having transportation issues. Patient advised me that she would find transportation and if it would be a problem she would call 911.

## 2020-01-08 ENCOUNTER — Telehealth (RURAL_HEALTH_CENTER): Payer: Self-pay

## 2020-01-08 NOTE — Telephone Encounter (Signed)
A user error has taken place: encounter opened in error, closed for administrative reasons.

## 2020-01-09 ENCOUNTER — Telehealth (RURAL_HEALTH_CENTER): Payer: Self-pay | Admitting: Nurse Practitioner

## 2020-01-12 ENCOUNTER — Ambulatory Visit: Payer: Medicare Managed Care Other | Attending: Nurse Practitioner | Admitting: Nurse Practitioner

## 2020-01-12 ENCOUNTER — Encounter (RURAL_HEALTH_CENTER): Payer: Self-pay | Admitting: Nurse Practitioner

## 2020-01-12 DIAGNOSIS — R42 Dizziness and giddiness: Secondary | ICD-10-CM

## 2020-01-12 MED ORDER — MECLIZINE HCL 25 MG PO TABS
25.0000 mg | ORAL_TABLET | Freq: Three times a day (TID) | ORAL | 1 refills | Status: DC | PRN
Start: 2020-01-12 — End: 2020-06-03

## 2020-01-12 NOTE — Progress Notes (Signed)
Patient Name: [name],[name]    Subjective   History of Present Illness:   [name] is a 34 y.o. female who presents with:  Chief Complaint   Patient presents with    Cough    Chest congestion    Dizziness    Fall     Cough and Chest Congestion:  Ms. Dorado reports today via telehealth with a complaint of a cough and chest congestion that she has been experiencing for about a month. She had a telehealth appointment with me on 12/30/2019 where she was prescribed doxycycline hyclate 100 MG twice a day and guaifenesin 200 MG three times a day as needed. She does report that these symptoms are improving.    Dizziness:  The patient reports symptoms of dizziness that she has been experiencing since she stopped taking her antiobiotics.. She states that this dizziness every time she wakes up, and if she turns her head a certain way it gets worse. She reports that the dizziness will last a couple of seconds, and she has tried taking dramamine to help with her dizziness, but it is still present.    The patient reports that she has been falling asleep very easily.    Ms. Arrambide reports that she has experienced some vision and hearing loss, and that her vision looks like "when you stare at a screen for a long time and it gets blurry."    This visit was conducted with the use of interactive audio / video telecommunication that permitted real time communication between [employer], and myself. She consented to participation and received services through videoconferencing while I was located at [facility].      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     No mechanical SBO -  atonic bowel with 6 hour SB transit time; No COLON present    Fibromyalgia     Gastroesophageal reflux disease     Insomnia     Seasonal allergic rhinitis        Past Surgical History:     Past Surgical History:   Procedure Laterality Date    APPENDECTOMY (OPEN)      CHOLECYSTECTOMY      Double Barrel Enterostomy  2013    for SBO    EGD, COLONOSCOPY N/A 08/19/2019    Procedure: EGD, COLONOSCOPY;  Surgeon: Gwenith Spitz, MD;  Location: [facility];  Service: Gastroenterology;  Laterality: N/A;    Enterostomy reversal  2014    double barrel small bowel ostomies reanastomosed    ILEOSTOMY  2011    for SBO after colectomy    ILEOSTOMY CLOSURE  2012    multiple times    LAPAROTOMY, COLECTOMY, TOTAL  2011    only a rectum remains    OVARIAN CYST SURGERY  2011-2016    several    SALPINGECTOMY, OPEN Right     2011       Family History:     Family History   Problem Relation Age of Onset    Hypertension Mother     Diabetes Mother  Leukemia Father     Liver disease Maternal Grandfather     Hyperlipidemia Paternal Grandfather        Social History:     Social History     Tobacco Use   Smoking Status Current Every Day Smoker    Packs/day: 1.00    Years: 20.00    Pack years: 20.00    Types: Cigarettes   Smokeless Tobacco Never Used     Social History     Substance and Sexual Activity   Alcohol Use Never     Social History     Substance and Sexual Activity   Drug Use Not Currently    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 Authorizing Provider   acetaminophen (TYLENOL) 325 MG tablet Take 2 tablets (650 mg total) by mouth every 6 (six) hours as needed for Pain 10/10/19  Yes Fadia, Artis Delay, MD   clonazePAM (KlonoPIN) 0.5 MG tablet Take 0.25 mg by mouth daily    11/07/19  Yes [provider]   diphenoxylate-atropine (LOMOTIL) 2.5-0.025 MG per tablet Take 2 tablets by mouth 4 (four) times daily as needed for Diarrhea 01/01/20  Yes Marja Kays T, NP   fluticasone-salmeterol (ADVAIR HFA) 230-21 MCG/ACT inhaler Inhale 2 puffs into the lungs 2 (two) times daily 12/13/18  Yes Ahjanae Cassel T, NP   guaifenesin (ROBITUSSIN) 100 MG/5ML liquid Take 10 mLs (200 mg total) by mouth 3 (three) times daily as needed for Cough 12/30/19  Yes Kitti Mcclish T, NP   hydrOXYzine (ATARAX) 25 MG tablet Take 50 mg by mouth as needed    07/18/19  Yes [provider]   naloxone (NARCAN) 4 MG/0.1ML nasal spray 1 spray intranasally. If pt does not respond or relapses into respiratory depression call 911. Give additional doses every 2-3 min. 03/10/19  Yes Arther Abbott, MD   ondansetron (Zofran) 4 MG tablet Take 1 tablet (4 mg total) by mouth daily as needed for Nausea 12/24/19 12/23/20 Yes York Cerise, FNP   pregabalin (LYRICA) 150 MG capsule Take 1 capsule (150 mg total) by mouth daily 12/30/19  Yes Kacy Conely T, NP   pregabalin (LYRICA) 300 MG capsule Take 1 capsule (300 mg total) by mouth daily 12/30/19  Yes Zoie Sarin T, NP   promethazine (PHENERGAN) 12.5 MG tablet Take 1 tablet (12.5 mg total) by mouth every 8 (eight) hours as needed for Nausea 11/10/19  Yes Cosette Prindle T, NP   tiZANidine (ZANAFLEX) 4 MG tablet Take 1 tablet (4 mg total) by mouth every 8 (eight) hours as needed (muscle spasm) 12/30/19  Yes Hyland Mollenkopf T, NP   topiramate (TOPAMAX) 25 MG tablet Take 25 mg by mouth every 12 (twelve) hours  Yes [provider]    traZODone (DESYREL) 300 MG tablet Take 300 mg by mouth nightly   Yes [provider]   venlafaxine (EFFEXOR-XR) 150 MG 24 hr capsule Take 150 mg by mouth daily   Yes [provider]   Xarelto 20 MG Tab Take 10 tablets by mouth daily 12/09/19  Yes [provider]   ondansetron (ZOFRAN-ODT) 4 MG disintegrating tablet  01/02/20 01/12/20  [provider]        Review of Systems:     14 system reiew negative except as noted in the HPI.    Physical Exam:      No vitals due to telephonic communication.     General:  no acute distress. Pleasant and well groomed.  Neck: no JVD, no tracheal deviation  Lungs: Effort normal, no use accessory muscles.  Neuro:  alert and oriented.   Skin: no rashes or lesions noted  Psychiatric/Behavioral: appropriate affect, mood and behavior.       Assessment:     1. Vertigo        Plan:     Requested Prescriptions     Signed Prescriptions Disp Refills    meclizine (ANTIVERT) 25 MG tablet 90 tablet 1     Sig: Take 1 tablet (25 mg total) by mouth 3 (three) times daily as needed for Dizziness       Patient Instructions   We make every attempt to respond to your calls and emails within 72 hours.  Please note that this is only during business hours.  If you have a question or a problem that needs attention sooner, and it is after hours please call the physician on-call at [phone], these services begin after 5PM on weekdays.  If you have an emergency, call 911.     Meclizine 25 mg three times a day as needed for dizziness.    Try Epley maneuver to treat vertigo:    1. Sit on a bed and place a pillow behind you.   2. Turn your head 45 degrees to the right.   3. Quickly lie back with your shoulders of the pillow and head reclined on the the bed for about 30 seconds.   4. Next, without raising your head, turn it 90 degrees to the left and wait another 30 seconds.  5. Now, quickly turn your whole body another 90 degrees to the left and stay in this position  for 30 seconds.   6. Slowly sit up, and bring your head to the center.   7. Repeat this procedure 3 times daily until you get relief.      Dizziness (Uncertain Cause)  Dizziness is a common symptom. It may be described as lightheadedness, spinning, or feeling like you are going to faint. Dizziness can have many causes.  Be sure to tell the healthcare provider about:   All medicines you take, including prescription, over-the-counter, herbs, and supplements   Any other symptoms you have   Any health problems you are being treated for   Anything that causes the dizziness to get worse or better  Today's exam did not show an exact cause for your dizziness.Other tests may be needed. Follow up with your healthcare provider.  Home care   Dizziness that occurs with sudden standing may be a sign of mild dehydration. Drink extra fluids for the next few days.   If you recently started a new medicine, stopped a medicine, or had the dose of a current  medicine changed,talk with the prescribing healthcare provider. Your medicine plan may need adjustment.   If dizziness lasts more than a few seconds, sit or lie down until it passes. This may help prevent injury in case you pass out.   Do not drive or use power tools or dangerous equipment until you have had no dizziness for at least 48 hours.  Follow-up care  Follow up with your healthcare provider for further evaluation within the next 7 days or as advised.  When to seek medical advice  Call your healthcare provider for any of the following:   Worsening of symptoms or new symptoms   Passing out or seizure   Repeated vomiting   Headache   Palpitations (the sense that your heart is fluttering or beating fast or hard)   Shortness of breath   Blood in vomit or stool (black or red color)   Weakness of an arm or leg or one side of the face   Vision or hearing changes   Trouble walking or speaking   Chest, arm, neck, back, or jaw pain  Date Last Reviewed: 09/14/2013    2000-2016 [employer] [address] [place], [place] [address]. All rights reserved. This information is not intended as a substitute for professional medical care. Always follow your healthcare professional's instructions.           Return if symptoms worsen or fail to improve.    Signed by: Kathi Simpers, NP     Supervising Doctor: Rogelio Seen, MD    By signing my name below, I, Westley Hummer, attest that this documentation has been prepared under the direction and in the presence of Arpan Eskelson, FNP-C.  Electronically Signed: Westley Hummer, Scribe.     I, Marja Kays, FNP-C, personally performed the services described in this documentation. All medical record entries made by the scribe were at my direction and in my presence. I have reviewed the chart and plan instructions and agree that the record reflects my personal performance and is accurate and complete.

## 2020-01-12 NOTE — Patient Instructions (Addendum)
We make every attempt to respond to your calls and emails within 72 hours.  Please note that this is only during business hours.  If you have a question or a problem that needs attention sooner, and it is after hours please call the physician on-call at [phone], these services begin after 5PM on weekdays.  If you have an emergency, call 911.     Meclizine 25 mg three times a day as needed for dizziness.    Try Epley maneuver to treat vertigo:    1. Sit on a bed and place a pillow behind you.   2. Turn your head 45 degrees to the right.   3. Quickly lie back with your shoulders of the pillow and head reclined on the the bed for about 30 seconds.   4. Next, without raising your head, turn it 90 degrees to the left and wait another 30 seconds.  5. Now, quickly turn your whole body another 90 degrees to the left and stay in this position for 30 seconds.   6. Slowly sit up, and bring your head to the center.   7. Repeat this procedure 3 times daily until you get relief.      Dizziness (Uncertain Cause)  Dizziness is a common symptom. It may be described as lightheadedness, spinning, or feeling like you are going to faint. Dizziness can have many causes.  Be sure to tell the healthcare provider about:  All medicines you take, including prescription, over-the-counter, herbs, and supplements  Any other symptoms you have  Any health problems you are being treated for  Anything that causes the dizziness to get worse or better  Today's exam did not show an exact cause for your dizziness. Other tests may be needed. Follow up with your healthcare provider.  Home care  Dizziness that occurs with sudden standing may be a sign of mild dehydration. Drink extra fluids for the next few days.  If you recently started a new medicine, stopped a medicine, or had the dose of a current medicine changed, talk with the prescribing healthcare provider. Your medicine plan may need adjustment.  If dizziness lasts more than a few seconds,  sit or lie down until it passes. This may help prevent injury in case you pass out.  Do not drive or use power tools or dangerous equipment until you have had no dizziness for at least 48 hours.  Follow-up care  Follow up with your healthcare provider for further evaluation within the next 7 days or as advised.  When to seek medical advice  Call your healthcare provider for any of the following:  Worsening of symptoms or new symptoms  Passing out or seizure  Repeated vomiting  Headache  Palpitations (the sense that your heart is fluttering or beating fast or hard)  Shortness of breath  Blood in vomit or stool (black or red color)  Weakness of an arm or leg or one side of the face  Vision or hearing changes  Trouble walking or speaking  Chest, arm, neck, back, or jaw pain  Date Last Reviewed: 09/14/2013  © 2000-2016 [employer]. [address], [place] [address]. All rights reserved. This information is not intended as a substitute for professional medical care. Always follow your healthcare professional's instructions.

## 2020-01-15 ENCOUNTER — Other Ambulatory Visit (RURAL_HEALTH_CENTER): Payer: Self-pay | Admitting: Nurse Practitioner

## 2020-01-15 DIAGNOSIS — M7541 Impingement syndrome of right shoulder: Secondary | ICD-10-CM

## 2020-01-22 ENCOUNTER — Telehealth (RURAL_HEALTH_CENTER): Payer: Self-pay

## 2020-01-22 ENCOUNTER — Other Ambulatory Visit (RURAL_HEALTH_CENTER): Payer: Self-pay | Admitting: Nurse Practitioner

## 2020-01-22 DIAGNOSIS — M7541 Impingement syndrome of right shoulder: Secondary | ICD-10-CM

## 2020-01-22 MED ORDER — TIZANIDINE HCL 4 MG PO TABS
4.0000 mg | ORAL_TABLET | Freq: Three times a day (TID) | ORAL | 0 refills | Status: DC | PRN
Start: 2020-01-22 — End: 2020-02-06

## 2020-01-22 NOTE — Telephone Encounter (Signed)
Medication has been sent to her local pharmacy.    Marja Kays, FNP-C

## 2020-01-22 NOTE — Telephone Encounter (Signed)
Patient has an appointment with Harriett Sine on Monday but is out of her tizanidine 4 mg , and wants to know if an RX can be sent in to get her through the weekend.-AN

## 2020-01-26 ENCOUNTER — Telehealth (RURAL_HEALTH_CENTER): Payer: Self-pay

## 2020-01-26 ENCOUNTER — Encounter (RURAL_HEALTH_CENTER): Payer: Self-pay | Admitting: Nurse Practitioner

## 2020-01-26 ENCOUNTER — Ambulatory Visit: Payer: Medicare Managed Care Other | Attending: Nurse Practitioner | Admitting: Nurse Practitioner

## 2020-01-26 VITALS — BP 126/62 | HR 83 | Temp 96.3°F | Resp 18 | Ht 65.0 in | Wt 142.4 lb

## 2020-01-26 DIAGNOSIS — M7541 Impingement syndrome of right shoulder: Secondary | ICD-10-CM

## 2020-01-26 DIAGNOSIS — G894 Chronic pain syndrome: Secondary | ICD-10-CM

## 2020-01-26 MED ORDER — PREGABALIN 150 MG PO CAPS
150.0000 mg | ORAL_CAPSULE | Freq: Three times a day (TID) | ORAL | 2 refills | Status: AC
Start: 2020-01-26 — End: 2021-01-25

## 2020-01-26 NOTE — Telephone Encounter (Signed)
[facility] called stated they were wanting to know what dose of the patients Lyrica is correct. A RX was sent if by a provider named Heather for 150 mg then a order for 300 mg was sent in by [facility] pharmacy wants to know what is correct, patient is requesting a refill.  [name]

## 2020-01-26 NOTE — Patient Instructions (Signed)
Medication Recommend Plan: Recommend to stay on tizanidine 4 mg every 9 hours as needed and continue Lyrica 450 mg daily. She would benefit from seeing higher level of care for pain management. She is agreeable and will back to Pain management in [place], [place] to seek treatment.     We make every attempt to respond to your calls and emails within 72 hours.  Please note that this is only during business hours.  If you have a question or a problem that needs attention sooner, and it is after hours please call the physician on-call at [phone], these services begin after 5PM on weekdays.  If you have an emergency, call 911.

## 2020-01-26 NOTE — Progress Notes (Signed)
Clinic Note    Date Time: 01/26/2020 2:55 PM  Patient Name: [name], [name]  DoB: [birth year]    History of Present Illness:   This a 35 y.o. female with a past medical history of. He presents with chronic stable pain. The patient is here today for medication management. History details were provided by the patient and gleaned from historical documented chart data. The patient is judged to be poor a reliable historian.     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     No mechanical SBO - atonic bowel with 6 hour SB transit time; No COLON present    Fibromyalgia     Gastroesophageal reflux disease     Insomnia     Seasonal allergic rhinitis      Past Surgical History:   Procedure Laterality Date    APPENDECTOMY (OPEN)      CHOLECYSTECTOMY      Double Barrel Enterostomy  2013    for SBO    EGD, COLONOSCOPY N/A 08/19/2019    Procedure: EGD, COLONOSCOPY;  Surgeon: Gwenith Spitz, MD;  Location: [facility];  Service: Gastroenterology;  Laterality: N/A;    Enterostomy reversal  2014    double barrel small bowel ostomies reanastomosed    ILEOSTOMY  2011    for SBO after colectomy    ILEOSTOMY CLOSURE  2012    multiple times    LAPAROTOMY, COLECTOMY, TOTAL  2011    only a rectum remains    OVARIAN CYST SURGERY  2011-2016    several    SALPINGECTOMY, OPEN Right     2011     Social History     Tobacco Use   Smoking Status Current Every Day Smoker    Packs/day: 1.00    Years: 20.00    Pack years: 20.00    Types: Cigarettes   Smokeless Tobacco Never Used     Social History     Substance and Sexual Activity   Alcohol Use Never     Social History     Substance and Sexual Activity   Drug Use Not Currently    Types: Heroin    Comment: opiates --  last use 2013         Positive Pathology (Imagining and Scans):US Venous Up Extrem Duplex Dopp Uni Right [VAS06] (Order [id number])    Status: Final result       Study Result    Narrative & Impression   Clinical History:  DVT; compare for change; right arm pain    Ordering Comments:   None.      Study Notes:   None.     Examination:  Two-dimensional grayscale, doppler spectral waveform analysis and color flow doppler imaging of the deep veins of the right upper extremity was performed from the forearm to subclavian, including the neck.    Comparison:  10/10/2019    Findings:  DVT in the mid to distal right brachial vein with no extension into the axillary or subclavian vein. No other thrombus identified.    IMPRESSION:   DVT involving the mid to distal right brachial vein with no extension into the axillary or subclavian  vein. No definite progression from 10/10/2019.         Current treatments for pain: Lyrica 600 mg daily and Zanaflex 8 mg three times a day, subuxone   Compliant with current treatment: no   Side effects of opioid therapy: n/a    Last pill was taken on 01/25/2020  Patient reports taking the following OTC medications or new prescriptions within the last 14 days: Tylenol    Conditions which exacerbate pain: weather, exercise to right shoulder    Conditions and treatments which ease pain: Taking Xarelto and medication, rest    Modalities which have been stopped because of failure: Suboxone    Quality of Life and Activities of Daily Living that are affected by this condition include: can't write or use right arm, decease range of motion, getting dressed, driving, shopping    Medication Efficacy:  The amount of pain relief with the current medication regimen in the patients own words:  "About 75-80%" "The muscle relaxer pretty much helps it".    The patient reports that they receive 15-16 hours of pain control from lyrica and zantaflex.      Overall personal goal for medication management: Would like to  have pain level from an 8/10 to 3-4/10.  (set realistic expectations)      Ashby Dawes of Pain - Pain today is described as...  Pain location: right arm    Pain Quality:    ___ Aching   _X__ Burning   ___ Shooting   ___ Stabbing   __X_ Other: "constant charlie horse" radiates into back and chest, dull cramping pain.    Pain Course:   _X__ Constant   ___ Intermittent   ___ Fluctuating   ___ Improving   ___ Worsening     Associated Symptoms:   _X__  Numbness - down to fingertips, relates to fibromyalgia  _X__  Weakness   __X_  Decreased Range of Motion   ___  Stiffness   __X_  Pain with Activity   _X__  Night Pain   _X__  Locking and Catching   ___  Other:      Pain level on worst day:10 out of 10  Pain level on best day:2-3 out of 10  Average Pain: 5-6 out of 10  Current pain level today:8 out of 10  How much relief with current analgesics: 70-80%    How much has pain interfered with:  General activity = 5/10  Mood = 7/10  Walking ability = 0/10  Normal work = 0/10  Relations with other people = 8/10  Sleep = 10/10  Enjoyment of life = 10/10  Ability to concentrate = 7/10    Review of Systems:     14 system review negative except as noted in the HPI.    Physical Exam:     Vitals:    01/26/20 1338   BP: 126/62   Pulse: 83   Resp: 18   Temp: (!) 96.3 F (35.7 C)   SpO2: 96%     General:  no acute distress. Pleasant and well groomed.  HEENT: pupils equal and round, sclera anicteric, normocephalic   Neck: supple, no lymphadenopathy, no thyromegaly, no JVD  Cardiovascular: regular rate and rhythm, no murmurs  Lungs: clear to auscultation bilaterally, without wheezing, rhonchi, or rales  Extremities: no edema  Musculoskeletal: Gait WNL. No swelling, redness of joints.   Neuro:  alert and oriented.   Skin: no rashes or lesions noted.  Psychiatric/Behavioral: appropriate affect, mood and behavior.  Assessment/Plan:     1. Chronic pain syndrome  pregabalin (LYRICA) 150 MG capsule   2. Impingement syndrome of right shoulder   pregabalin (LYRICA) 150 MG capsule       Assessment of patient's risk of substance abuse:     Opioid Risk Tool:   Female Female Score   Family history of alcohol abuse 1 3 3    Family history of illegal drug abuse 2 3 3    Family history of Rx drug abuse 4 4 3    Personal history alcohol abuse 3 3 0   Personal history illegal drug abuse 4 4 4    Personal history Rx drug abuse 5 5 4    Age 60-45 1 1 2    History preadolescent sex abuse 3 0 0   Personal History ADD, OCD, bipolar, schizophrenia 2 2 0   Personal history of Depression 1 1 1    Scoring Total   20   3 or lower = low risk for opioid abuse  4-7 = moderate risk for opioid abuse  8 or higher = high risk for opioid abuse    Potential Aberrant Drug-Related Behavior:      CMP:   Last Resulted CBC or CMP Components:     Date/Time Component Value Flag Units Reference Range Lab Status    12/15/19 0918 WBC 11.0 -- K/cmm 4.0 - 11.0 K/cmm Final result    12/15/19 0918 RBC 4.17 -- M/cmm 3.80 - 5.00 M/cmm Final result    12/15/19 0918 HGB 14.3 -- gm/dL 16.1 - 09.6 gm/dL Final result    04/54/09 0918 HCT 42.6 -- % 36.0 - 48.0 % Final result    12/15/19 0918 PLT 319 -- K/cmm 130 - 440 K/cmm Final result    11/28/19 1120 NA 141 -- mMol/L 136 - 147 mMol/L Final result    11/28/19 1120 K 4.2 -- mMol/L 3.5 - 5.3 mMol/L Final result    11/28/19 1120 CL 108 -- mMol/L 98 - 110 mMol/L Final result    11/28/19 1120 CO2 26.0 -- mMol/L 20.0 - 30.0 mMol/L Final result    11/28/19 1120 CREAT 0.95 -- mg/dL 8.11 - 9.14 mg/dL Final result    78/29/56 1120 BUN 12 -- mg/dL 7 - 22 mg/dL Final result    21/30/86 1120 ALB 3.8 -- gm/dL 3.5 - 5.0 gm/dL Final result    57/84/69 1120 CA 9.1 -- mg/dL 8.5 - 62.9 mg/dL Final result    52/84/13 1555 PHOS 2.3 -- mg/dL 2.3 - 4.7 mg/dL Final result    24/40/10 1849 MG 1.8 -- mg/dL 1.6 - 2.6 mg/dL Final result    27/25/36 1120 AST 14 -- U/L 10 - 42 U/L Final result    11/28/19 1120 ALT 13 -- U/L 0 - 55 U/L Final result    11/28/19 1120 ALKPHOS 97 -- U/L 40 - 145  U/L Final result    11/28/19 1120 BILITOTAL 0.5 -- mg/dL 0.1 - 1.2 mg/dL Final result        Renal and liver function tests are WNLand no adjustments need to be made based on clearance.     Signed pain management contract and informed consent present on the chart: Date last updated: n/a    Date of most recent urine drug screen: n/a  Date of most recent pill count:n/a    Prescription Monitoring Program was accessed on today and displays appropriate filling. Medications match prescriber and appropriate date written.  The report does not show any blatant irregularities such as  doctor shopping or refills being filled to early.    The NARX Scores from today:     Narcotic Score: 781    Sedative Score:802    Stimulant: 000    Overdose Risk Score: 590    Total MME: n/a  Naloxone prescribed: none    Extenuating circumstances for the concomitant use of benzodiazepines, sedative hypnotics, carisoprodol, or tramadol: none, currently taking benzodiazepines  Extenuating circumstances to exceed 120 MME/day: none  Tapering plan to achieve the lowest possible effective doses: yes  Referral to pain management specialist recommended (if MME/day exceed 30)? yes    Treatment plan and rationale for continuing opioid therapy:   Adequate pain relief has been achieved: no  Current therapy has improved physical and psychosocial function: yes  Current therapy has improved quality of life: no  Current therapy has improved ability to engage in daily activities: yes  Current treatment has shown benefit: yes      Refills prescribed: tramadol 150 TID.      Assessment and Consultation recommendation:   [name],[name] presents today for medication management. Their pathology for pain is listed above in HPI.     PMP was accessed on today and is appropriate. There are no blatant irregularities such as doctor shopping or refills being filled to early.   UDS has been obtained within last 3 months and is appropriate. Medications have shown  positive benefits with activities of daily living and it is reasonable to continue with current drug regimen. I discussed the risks and benefits of long term opioid use which included potential side effects such as constipation, decreased respirations, decreased reflexes, physical dependence, tolerance, depression, and the unlikely hood that the medications will completely eliminate pain. I also discussed the patient's responsibilities  to include securely storing the drug and properly disposing of any unwanted or unused drugs. I have also discussed with the patient an exit strategy for the discontinuation of opioids in the event they are not effective.     [name],[name] can currently be controlled with this current regimen. I provided guidance on realistic expectations. I risk stratify this patient to be a low risk for abuse, low risk for overutilization, and low risk for diversion.     Medication Recommend Plan: Recommend to stay on tizanidine 4 mg every 9 hours as needed and continue Lyrica 450 mg daily. She would benefit from seeing higher level of care for pain management. She is agreeable and will back to Pain management in [place], [place] to seek treatment.

## 2020-01-26 NOTE — Telephone Encounter (Signed)
New rx was sent in for 150 three times a day. Patient will return to pain management in [place] for continued prescriptions of this medication.     Kathi Simpers, NP

## 2020-01-27 ENCOUNTER — Encounter: Payer: Self-pay | Admitting: Emergency Medicine

## 2020-01-27 ENCOUNTER — Emergency Department
Admission: EM | Admit: 2020-01-27 | Discharge: 2020-01-27 | Disposition: A | Discharge status: Home / Self Care | Payer: Medicare Managed Care Other | Attending: Emergency Medicine | Admitting: Emergency Medicine

## 2020-01-27 ENCOUNTER — Telehealth (RURAL_HEALTH_CENTER): Payer: Self-pay | Admitting: Family Medicine

## 2020-01-27 DIAGNOSIS — R531 Weakness: Secondary | ICD-10-CM | POA: Insufficient documentation

## 2020-01-27 DIAGNOSIS — U071 COVID-19: Secondary | ICD-10-CM | POA: Insufficient documentation

## 2020-01-27 LAB — VH XPERT XPRESS © COV-2/FLU/RSV PLUS
Date of Onset: 20220101
Does patient reside in a congregate care setting?: NEGATIVE
Influenza A RNA: NEGATIVE
Influenza B RNA: NEGATIVE
Is patient employed in a healthcare setting?: NEGATIVE
RSV RNA: NEGATIVE
SARS-CoV-2 RNA: POSITIVE — CR

## 2020-01-27 NOTE — Telephone Encounter (Signed)
FYI:  Called by patient who reports ongoing COVID sxs. Had been discharged from the ER about 1 hour prior to the call. Pulse Ox was 96%. No change in sxs from ER. I discussed ER precautions for worsening symptoms pulse ox less than 92.       Levie Heritage, MD  On-call physician

## 2020-01-27 NOTE — EDIE (Signed)
COLLECTIVE?NOTIFICATION?01/27/2020 17:36?JAMEY, DEMCHAK M?MRN: [id number]    [facility]'s patient encounter information:   VWU:?[id number]  Account [id number]  Billing Account [id number]      Criteria Met      High Utilization (6+ ED Visits/6 Mo.)    Security and Safety  No recent Security Events currently on file    ED Care Guidelines  There are currently no ED Care Guidelines for this patient. Please check your facility's medical records system.        Prescription Monitoring Program  781??- Narcotic Use Score  803??- Sedative Use Score  000??- Stimulant Use Score  590??- Overdose Risk Score  - All Scores range from 000-999 with 75% of the population scoring < 200 and on 1% scoring above 650  - The last digit of the narcotic, sedative, and stimulant score indicates the number of active prescriptions of that type  - Higher Use scores correlate with increased prescribers, pharmacies, mg equiv, and overlapping prescriptions  - Higher Overdose Risk Scores correlate with increased risk of unintentional overdose death   Concerning or unexpectedly high scores should prompt a review of the PMP record; this does not constitute checking PMP for prescribing purposes.      E.D. Visit Count (12 mo.)  Facility Visits   [facility] [address] - [facility] 3   Total 17   Note: Visits indicate total known visits.     Recent Emergency Department Visit Summary  Showing 10 most recent visits out of 17 in the past 12 months  Date Facility [facility] Type Diagnoses or Chief Complaint   Jan 27, 2020 [name] [place] [facility]      weakness      Nov 28, 2019 [name] [place] [facility]      low blood pressure, trouble urinating, cough, weakness      Dizziness      Hypotension      Pneumonia, unspecified organism      Oct 27, 2019 [name] [place] [facility]      SOB/COVID SYMPTOMS      Arm Pain      Headache       Fever      Sore Throat      Malingerer [conscious simulation]      Procedure and treatment not carried out due to patient leaving prior to being seen by health care provider      Acute embolism and thrombosis of deep veins of right upper extremity      Oct 16, 2019 [name] [place] [facility]      chest pain      Arm Pain      Fatigue      Shortness of Breath      Pain in right arm      Other chest pain      Oct 13, 2019 [name] [place] [facility]      neck pain      Arm Pain      Deep Venous Thrombosis      Patient's noncompliance with other medical treatment and regimen      Oct 10, 2019 [name]      Arm pain      Malingerer [conscious simulation]      Opioid abuse, in remission  Pain in right arm      Oct 10, 2019 [name] [place] [facility]      Arm Pain and swelling      Arm Injury      Acute embolism and thrombosis of deep veins of right upper extremity      Sep 24, 2019 [name] [place] [facility]      Fever; Headache; Nausea      Viral infection, unspecified      Sep 13, 2019 [name] [place] [facility]      Short term memory loss, headache      Generalized Body Aches      Memory Loss      Weakness      Urinary tract infection, site not specified      May 24, 2019 [name] [place] [facility]      OverDose      Drug Overdose      Poisoning by unspecified drugs, medicaments and biological substances, undetermined, initial encounter          Recent Inpatient Visit Summary  Date Facility [facility] Type Diagnoses or Chief Complaint   May 24, 2019 [facility]      Poisoning by unspecified drugs, medicaments and biological substances, accidental (unintentional), initial encounter      Fibromyalgia      May 03, 2019 [facility]      Unspecified intestinal obstruction, unspecified as to partial versus  complete obstruction      Other chronic pain      Unspecified abdominal pain      Other specified functional intestinal disorders      Generalized anxiety disorder      Fibromyalgia      Bipolar disorder, unspecified      Mar 11, 2019 [facility] [place] [facility]      Unspecified intestinal obstruction, unspecified as to partial versus complete obstruction      Mar 08, 2019 [facility] [place] [facility]      Unspecified intestinal obstruction, unspecified as to partial versus complete obstruction          Care Team  Provider Specialty Phone Fax Service Dates   Adline Mango, MD Family Medicine [phone] Jan 24, 2019 - Current      Collective Portal  This patient has registered at the [facility] Emergency Department   For more information visit: [email or web address]     PLEASE NOTE:     1.   Any care recommendations and other clinical information are provided as guidelines or for historical purposes only, and providers should exercise their own clinical judgment when providing care.    2.   You may only use this information for purposes of treatment, payment or health care operations activities, and subject to the limitations of applicable Collective Policies.    3.   You should consult directly with the organization that provided a care guideline or other clinical history with any questions about additional information or accuracy or completeness of information provided.    ? 2022 [place], [employer] - [email or web address]

## 2020-01-27 NOTE — ED Provider Notes (Signed)
[facility] EMERGENCY DEPARTMENT  [place], [place]       Voice recognition software was used to generate this report. Every attempt was made at correction, however, word substitution, grammatical, and transcription errors may occur.     Physician/Midlevel provider first contact with patient: 01/27/20 1745         FINAL DIAGNOSIS:  1. COVID-19 virus infection     2. Generalized weakness           ASSESSMENT/DIFFERENTIAL DIAGNOSIS:  [name] is a 35 y.o. female with nonspecific generalized weakness. No definitive evidence of ischemic, embolic, surgical, bacterial disease.      The differential diagnoses that were considered in the evaluation of this patient include, but were not limited to, COVID-19, influenza, overdose, substance abuse, viral syndrome, dehydration, among others. -     ED COURSE & MEDICAL DECISION MAKING:    Respiratory swab was sent.  Discharged home awaiting results.  She was discharged with someone who can look after her. I spoke with her by phone and gave her the current CDC advice for isolation.-      Visit Vitals  BP (!) 131/97   Pulse 82   Temp 98.2 F (36.8 C) (Tympanic)   Resp 16   Ht 1.656 m   Wt 64.6 kg   LMP 01/09/2020 (Exact Date)   SpO2 96%   BMI 23.56 kg/m     ED Course as of 01/27/20 1851   Tue Jan 27, 2020   1850 SARS-CoV-2 RNA(!!): Positive  Advise patient by telephone of her positive test and the need to self isolate. [KF]      ED Course User Index  [KF] Vida Rigger, MD      RISK ASSESSMENT: The patient's records were checked through the [facility] (and other states where applicable and available), used in accordance with official notices posted in the Emergency Department and waiting room in accordance with law. This demonstrates a high number of prescriptions, providers and/or pharmacies. This is monitored to aid in safety assessment for misuse, overuse, doctor-shopping, and/or diversion. The use of narcotic medication  should be limited to only the amount absolutely necessary to treat the patient and only in situations where narcotics are clearly indicated. This is a patient safety issue. Specifically 77 controlled substances prescriptions from 7 provider(s) utilizing 3 pharmacies.      RISK ASSESSMENT: The patient's records were checked through the Emergency Department Information Exchange in accordance with federal law. This demonstrates a frequency of ED visits at various hospitals, concerning chief complaints, number of prescriptions, providers and/or pharmacies that surpass a certain threshold. This is to assist in providing safe, rational care for persons who have triggered the EDIE through the system algorithm. This is a patient safety issue. Specifically, 17 visits to the emergency department at 2 institution(s) in the past year.      Patient's evaluation indicates the patient does not appear to have a need for inpatient workup and/or management. No further testing indicated acutely. Prior visits/ charts: reviewed. Nurses' notes reviewed. Pertinent lab and radiology data was reviewed by me and discussed with patient and family, if present. I had a detailed discussion about the diagnosis, workup and treatment plan, and we are in agreement.    MEDICATIONS ORDERED THIS VISIT:   Medications - No data to display    PROCEDURES:   Procedures    CONSULTATION:   None     DISPOSITION:    Discharge from the  Emergency Department.      Discharge Medication List as of 01/27/2020  5:57 PM          CONDITION AT DISPOSITION: Good, unchanged. Stable.         HISTORIAN:  History obtained from the patient and EMS.    CHIEF COMPLAINT:  Chief Complaint   Patient presents with    Generalized weakness    " Just do not feel well" according to patient    HISTORY OF PRESENT ILLNESS:    [name] is a  35 y.o. female had to be awakened to talk with me, and who complains of not feeling well.  Nothing very specific.  She states she was  exposed to somebody who was exposed to COVID-19.  She did not have any direct exposure herself.  She is where she is not using any narcotics and has not taken anything extra.  However, she falls asleep during the examination.   HPI -   .  REVIEW OF SYSTEMS:   Review of Systems. At least 10 systems reviewed and negative, unless otherwise specified above.    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   Past Surgical History:   Procedure Laterality Date    APPENDECTOMY (OPEN)      CHOLECYSTECTOMY      Double Barrel Enterostomy  2013    for SBO    EGD, COLONOSCOPY N/A 08/19/2019    Procedure: EGD, COLONOSCOPY;  Surgeon: Gwenith Spitz, MD;  Location: [facility];  Service: Gastroenterology;  Laterality: N/A;    Enterostomy reversal  2014    double barrel small bowel ostomies reanastomosed    ILEOSTOMY  2011    for SBO after colectomy    ILEOSTOMY CLOSURE  2012    multiple times    LAPAROTOMY, COLECTOMY, TOTAL  2011    only a rectum remains    OVARIAN CYST SURGERY  2011-2016    several    SALPINGECTOMY, OPEN Right     2011   .     IMMUNIZATIONS:  Immunization History   Administered Date(s) Administered    INFLUENZA QUAD (FLULAVAL ONLY) 6 MOS & GREATER PRESERVED 11/24/2015    Influenza Vacc QUAD Recombinant PF 53yrs & up 11/15/2018    Influenza quadrivalent (IM) 6 months & up PRESERVED (Afluria/Fluzone) 11/24/2015    Influenza quadrivalent (IM) PF 3 Yrs & greater 11/16/2016, 11/10/2019      COVID19 Vaccine  Status:   []  Unknown    [x]  Documented infection: 01/27/2020  [x]  None Partial Fully Booster   []  Pfizer/BioNtech []  []  []    []  Moderna []  []  []    []  Johnson&Johnson  []  []    []  Other []  []  []    []  Unknown which []  []  []        FAMILY HISTORY:  Family History  Problem Relation Age of Onset    Hypertension Mother     Diabetes Mother     Leukemia Father     Liver disease Maternal Grandfather     Hyperlipidemia Paternal Grandfather        HOME MEDICATIONS:  Home Medications             acetaminophen (TYLENOL) 325 MG tablet     Take 2 tablets (650 mg total) by mouth every 6 (six) hours as needed for Pain     clonazePAM (KlonoPIN) 0.5 MG tablet     Take 0.25 mg by mouth daily        diphenoxylate-atropine (LOMOTIL) 2.5-0.025 MG per tablet     Take 2 tablets by mouth 4 (four) times daily as needed for Diarrhea     fluticasone-salmeterol (ADVAIR HFA) 230-21 MCG/ACT inhaler     Inhale 2 puffs into the lungs 2 (two) times daily     hydrOXYzine (ATARAX) 25 MG tablet     Take 50 mg by mouth as needed        meclizine (ANTIVERT) 25 MG tablet     Take 1 tablet (25 mg total) by mouth 3 (three) times daily as needed for Dizziness     naloxone (NARCAN) 4 MG/0.1ML nasal spray     1 spray intranasally. If pt does not respond or relapses into respiratory depression call 911. Give additional doses every 2-3 min.     ondansetron (Zofran) 4 MG tablet     Take 1 tablet (4 mg total) by mouth daily as needed for Nausea     pregabalin (LYRICA) 150 MG capsule     Take 1 capsule (150 mg total) by mouth 3 (three) times daily     promethazine (PHENERGAN) 12.5 MG tablet     Take 1 tablet (12.5 mg total) by mouth every 8 (eight) hours as needed for Nausea     tiZANidine (ZANAFLEX) 4 MG tablet     Take 1 tablet (4 mg total) by mouth every 8 (eight) hours as needed (muscle spasm)     Patient taking differently: Take 8 mg by mouth every 8 (eight) hours as needed (muscle spasm) Taking  two 4mg  tabs every 8 hrs       topiramate (TOPAMAX) 25 MG  tablet     Take 25 mg by mouth every 12 (twelve) hours        traZODone (DESYREL) 300 MG tablet     Take 300 mg by mouth nightly     venlafaxine (EFFEXOR-XR) 150 MG 24 hr capsule     Take 150 mg by mouth daily     Xarelto 20 MG Tab     Take 10 tablets by mouth daily           ALLERGY:  Allergies   Allergen Reactions    Fentanyl Anaphylaxis    Ketorolac Tromethamine Itching    Morphine Itching    Vancomycin Other (See Comments) and Edema     throat swelling and turns red     Latex Itching    Oxycodone Hives    Oxycodone-Acetaminophen Hives    Reglan [Metoclopramide] Hives    Rocephin [Ceftriaxone] Hives    Tramadol Itching and Nausea And Vomiting    Walnuts [Tree Nuts] Other (See Comments)     diverticulitis        SOCIAL HISTORY:   Social History     Tobacco Use    Smoking status: Current Every Day Smoker  Packs/day: 1.00     Years: 20.00     Pack years: 20.00     Types: Cigarettes    Smokeless tobacco: Never Used   Vaping Use    Vaping Use: Never used   Substance Use Topics    Alcohol use: Never    Drug use: Not Currently     Types: Heroin     Comment: opiates -- last use 2013       NOTE: If the PMH or PSH or social history sections read "Not on file" or are left blank it reflects that the histories were verbally investigated but there is no past medical or surgical history, or tobacco, alcohol or recreational drug use except as noted.    PHYSICAL EXAMINATION:  VITAL SIGNS reviewed by me:   Vitals:    01/27/20 1748   BP: (!) 131/97   Pulse: 82   Resp: 16   Temp: 98.2 F (36.8 C)   SpO2: 96%      Reviewed by the Emergency Physician  Pulse Ox Interpretation by Emergency Physician: normal   WEIGHT:   Weight Monitoring 12/30/2019 01/26/2020 01/27/2020   Height 165.1 cm 165.1 cm 165.6 cm   Height Method - - -   Weight 62.596 kg 64.592 kg 64.6 kg   Weight Method - - Stated   BMI (calculated) 23 kg/m2 23.7 kg/m2 23.6 kg/m2       CONSTITUTIONAL: Vital signs reviewed. Patient is chronically unwell-appearing.  Patient appears very sleepy. Hydration: normal. Not toxic appearing.   HEAD: Atraumatic. Normocephalic.   EYES: Eyes are normal to inspection. Pupils very small, equal, round, reactive to light. Discharge from eyes: none. Extraocular muscles: intact.   ENT: No facial swelling. External ears normal. No discharge from ears.  Edentulous.  Mucosa is mildly dry.  NECK: Range of motion: normal. Tenderness: none. Trachea midline. Jugular venous distention: none.   CARDIOVASCULAR: Rate: normal. Rhythm: Regular. Murmurs: none. Normal S1 S2.   RESPIRATORY/CHEST: Chest tenderness: none. Breath sounds: normal. Respiratory distress: none. Accessory muscle use: none. Work of breathing: normal.   BACK: Tenderness: none. Range of motion: normal for age. Visible abnormality: none. Palpable abnormality: none.  ABDOMEN: Soft. Distention: none. Tenderness: none. No guarding. No rebound. Bowel sounds: normal.   RECTAL: Not performed.   GENITALIA: Not examined.   UPPER EXTREMITY: Chronic right upper extremity edema. No cyanosis. No clubbing. Normal range of motion.    LOWER EXTREMITY: No edema. No cyanosis. No clubbing. Normal range of motion. No calf tenderness.    NEUROLOGIC: Very sleepy.  Awakens to verbal stimulus. Strength: normal. Sensation: normal. Cranial nerves: intact. Conversation: Slurred speech.  Once awakened, however, she is able to ambulate under her own power without difficulty.  SKIN: Skin is normal color, warm, and dry. Skin intact. Rash: None.   LYMPHATIC: No pathologic adenopathy. No lymphangitis.   PSYCHIATRIC: Affect: Flat. Insight: Undetermined.  Physical Exam    DATA REVIEWED:   LABS:      Results     Procedure Component Value Units Date/Time    Respiratory Specimen Xpert Xpress  CoV-2 / Flu / RSV Plus [[id number]]  (Abnormal) Collected: 01/27/20 1745    Specimen: Nasopharyngeal Swab Updated: 01/27/20 1833     Influenza A RNA Negative     Influenza B RNA Negative     RSV RNA Negative     SARS-CoV-2 RNA Positive      Does patient have symptoms related to condition of interest? Y     Date  of Onset [id number]     Is patient employed in a healthcare setting? N     Does patient reside in a congregate care setting? N     Is the patient pregnant? UNK    Narrative:      Specimen source - Nasopharyngeal Swab     Influenza A tests are unable to distinguish between novel and seasonal influenza A.     A negative result for either Influenza A or B does not exclude influenza virus infection. Clinical correlation required.     All positive influenza tests (A or B) require placement of patient on droplet precaution isolation.          Reviewed and interpreted by the Emergency Physician       Vida Rigger, MD  01/27/20 [phone]

## 2020-01-28 ENCOUNTER — Telehealth (RURAL_HEALTH_CENTER): Payer: Self-pay

## 2020-01-28 NOTE — Telephone Encounter (Signed)
Patient called today, she tested positive for COVID on 01/27/19.plained to the patient to treat her Sx with OTC medications, stay hydrated and rest. Advised if patients Sx were to worsen to callus back. Patient aware and understands.

## 2020-02-05 ENCOUNTER — Other Ambulatory Visit (RURAL_HEALTH_CENTER): Payer: Self-pay | Admitting: Nurse Practitioner

## 2020-02-05 DIAGNOSIS — M7541 Impingement syndrome of right shoulder: Secondary | ICD-10-CM

## 2020-02-11 ENCOUNTER — Ambulatory Visit (RURAL_HEALTH_CENTER): Payer: Medicare Managed Care Other | Admitting: Nurse Practitioner

## 2020-02-11 ENCOUNTER — Emergency Department: Payer: Medicare Managed Care Other

## 2020-02-11 ENCOUNTER — Emergency Department
Admission: EM | Admit: 2020-02-11 | Discharge: 2020-02-11 | Disposition: A | Discharge status: Home / Self Care | Payer: Medicare Managed Care Other | Attending: Emergency Medicine | Admitting: Emergency Medicine

## 2020-02-11 DIAGNOSIS — R197 Diarrhea, unspecified: Secondary | ICD-10-CM | POA: Insufficient documentation

## 2020-02-11 DIAGNOSIS — R1110 Vomiting, unspecified: Secondary | ICD-10-CM | POA: Insufficient documentation

## 2020-02-11 DIAGNOSIS — N390 Urinary tract infection, site not specified: Secondary | ICD-10-CM

## 2020-02-11 DIAGNOSIS — N179 Acute kidney failure, unspecified: Secondary | ICD-10-CM | POA: Insufficient documentation

## 2020-02-11 DIAGNOSIS — U071 COVID-19: Secondary | ICD-10-CM | POA: Insufficient documentation

## 2020-02-11 DIAGNOSIS — K219 Gastro-esophageal reflux disease without esophagitis: Secondary | ICD-10-CM | POA: Insufficient documentation

## 2020-02-11 DIAGNOSIS — E860 Dehydration: Secondary | ICD-10-CM | POA: Insufficient documentation

## 2020-02-11 LAB — VH URINALYSIS WITH MICROSCOPIC AND CULTURE IF INDICATED
Bilirubin, UA: NEGATIVE mg/dL
Glucose, UA: NEGATIVE mg/dL
Ketones UA: NEGATIVE mg/dL
Leukocyte Esterase, UA: NEGATIVE Leu/uL
Nitrite, UA: POSITIVE — AB
Urine Specific Gravity: 1.01 (ref 1.001–1.040)
Urobilinogen, UA: 0.2 mg/dL
pH, Urine: 6 pH (ref 5.0–8.0)

## 2020-02-11 LAB — CBC AND DIFFERENTIAL
Basophils %: 1 % (ref 0.0–3.0)
Basophils Absolute: 0.2 10*3/uL (ref 0.0–0.3)
Eosinophils Absolute: 0 10*3/uL (ref 0.0–0.8)
Hematocrit: 43.4 % (ref 36.0–48.0)
Hemoglobin: 14.3 gm/dL (ref 12.0–16.0)
Lymphocytes Absolute: 1.1 10*3/uL (ref 0.6–5.1)
Lymphocytes: 5 % — ABNORMAL LOW (ref 15.0–46.0)
MCH: 33 pg (ref 28–35)
MCHC: 33 gm/dL (ref 31–36)
MCV: 99 fL (ref 80–100)
MPV: 9.5 fL (ref 6.0–10.0)
Monocytes Absolute: 2.8 10*3/uL — ABNORMAL HIGH (ref 0.1–1.7)
Monocytes: 13 % (ref 3.0–15.0)
Neutrophils %: 81 % — ABNORMAL HIGH (ref 42.0–78.0)
Neutrophils Absolute: 17.3 10*3/uL — ABNORMAL HIGH (ref 1.7–8.6)
PLT CT: 277 10*3/uL (ref 130–440)
RBC: 4.36 10*6/uL (ref 3.80–5.00)
RDW: 11.9 % (ref 10.5–14.5)
WBC: 21.3 10*3/uL — ABNORMAL HIGH (ref 4.0–11.0)

## 2020-02-11 LAB — COMPREHENSIVE METABOLIC PANEL
ALT: 12 U/L (ref 0–55)
AST (SGOT): 15 U/L (ref 10–42)
Albumin/Globulin Ratio: 1.22 Ratio (ref 0.80–2.00)
Albumin: 3.9 gm/dL (ref 3.5–5.0)
Alkaline Phosphatase: 87 U/L (ref 40–145)
Anion Gap: 13.7 mMol/L (ref 7.0–18.0)
BUN / Creatinine Ratio: 9.3 Ratio — ABNORMAL LOW (ref 10.0–30.0)
BUN: 21 mg/dL (ref 7–22)
Bilirubin, Total: 0.4 mg/dL (ref 0.1–1.2)
CO2: 21 mMol/L (ref 20.0–30.0)
Calcium: 8.7 mg/dL (ref 8.5–10.5)
Chloride: 106 mMol/L (ref 98–110)
Creatinine: 2.27 mg/dL — ABNORMAL HIGH (ref 0.60–1.20)
EGFR: 28 mL/min/{1.73_m2} — ABNORMAL LOW (ref 60–150)
Globulin: 3.2 gm/dL (ref 2.0–4.0)
Glucose: 106 mg/dL — ABNORMAL HIGH (ref 71–99)
Osmolality Calculated: 277 mOsm/kg (ref 275–300)
Potassium: 3.7 mMol/L (ref 3.5–5.3)
Protein, Total: 7.1 gm/dL (ref 6.0–8.3)
Sodium: 137 mMol/L (ref 136–147)

## 2020-02-11 LAB — BASIC METABOLIC PANEL
Anion Gap: 11 mMol/L (ref 7.0–18.0)
BUN / Creatinine Ratio: 10.9 Ratio (ref 10.0–30.0)
BUN: 16 mg/dL (ref 7–22)
CO2: 19 mMol/L — ABNORMAL LOW (ref 20.0–30.0)
Calcium: 7.6 mg/dL — ABNORMAL LOW (ref 8.5–10.5)
Chloride: 113 mMol/L — ABNORMAL HIGH (ref 98–110)
Creatinine: 1.47 mg/dL — ABNORMAL HIGH (ref 0.60–1.20)
EGFR: 47 mL/min/{1.73_m2} — ABNORMAL LOW (ref 60–150)
Glucose: 101 mg/dL — ABNORMAL HIGH (ref 71–99)
Osmolality Calculated: 279 mOsm/kg (ref 275–300)
Potassium: 4 mMol/L (ref 3.5–5.3)
Sodium: 139 mMol/L (ref 136–147)

## 2020-02-11 LAB — TROPONIN I: Troponin I: 0.02 ng/mL (ref 0.00–0.02)

## 2020-02-11 LAB — D-DIMER, QUANTITATIVE: D-Dimer: 0.22 mg/L FEU (ref 0.19–0.52)

## 2020-02-11 LAB — LACTIC ACID, PLASMA: Lactic Acid: 1.4 mMol/L (ref 0.5–1.9)

## 2020-02-11 MED ORDER — SODIUM CHLORIDE 0.9 % IV BOLUS
1000.0000 mL | Freq: Once | INTRAVENOUS | Status: AC
Start: 2020-02-11 — End: 2020-02-11
  Administered 2020-02-11: 15:00:00 1000 mL via INTRAVENOUS

## 2020-02-11 MED ORDER — ACETAMINOPHEN 10 MG/ML IV SOLN
INTRAVENOUS | Status: AC
Start: 2020-02-11 — End: ?
  Filled 2020-02-11: qty 100

## 2020-02-11 MED ORDER — SODIUM CHLORIDE 0.9 % IV BOLUS
1000.0000 mL | Freq: Once | INTRAVENOUS | Status: AC
Start: 2020-02-11 — End: 2020-02-11
  Administered 2020-02-11: 17:00:00 1000 mL via INTRAVENOUS

## 2020-02-11 MED ORDER — NITROFURANTOIN MONOHYD MACRO 100 MG PO CAPS
100.0000 mg | ORAL_CAPSULE | Freq: Two times a day (BID) | ORAL | 0 refills | Status: DC
Start: 2020-02-11 — End: 2020-02-17

## 2020-02-11 MED ORDER — SODIUM CHLORIDE 0.9 % IV BOLUS
1000.0000 mL | Freq: Once | INTRAVENOUS | Status: AC
Start: 2020-02-11 — End: 2020-02-11
  Administered 2020-02-11: 16:00:00 1000 mL via INTRAVENOUS

## 2020-02-11 MED ORDER — ACETAMINOPHEN 10 MG/ML IV SOLN
1000.0000 mg | Freq: Once | INTRAVENOUS | Status: AC
Start: 2020-02-11 — End: 2020-02-11
  Administered 2020-02-11: 16:00:00 1000 mg via INTRAVENOUS

## 2020-02-11 NOTE — ED Notes (Signed)
Pt reported that she had N/V/D the last 2 days.

## 2020-02-11 NOTE — Discharge Instructions (Signed)
Increase your fluid intake.  Your kidney function test was not normal today.  It did improve with IV fluids.    It is very important to get your kidney function test repeated within the next 2 or 3 days.    Return to the ER immediately if you feel more ill, if you have persistent vomiting or diarrhea, weakness, any fainting episodes or other changes in your condition.

## 2020-02-11 NOTE — ED Provider Notes (Signed)
[facility] EMERGENCY DEPARTMENT  [place], [place]          Physician/Midlevel provider first contact with patient: 02/11/20 1333         FINAL DIAGNOSIS:  1. Acute kidney injury     2. Dehydration     3. COVID-19     4. Urinary tract infection without hematuria, site unspecified       Critical care time: 45 minutes, excluding any separate procedures.    ASSESSMENT/DIFFERENTIAL DIAGNOSIS:  [name] is a 35 y.o. female with generalized weakness and transient hypotension.  Laboratory findings are consistent with acute kidney injury, most likely due to dehydration.  She was given a total of 3 L of IV saline.  Her creatinine did significantly improved.  She is feeling much better and is actually asymptomatic now.  She wants to go home.    We discussed the significance of her laboratory findings.  She needs close outpatient follow-up.  I recommended increase p.o. fluid intake as well as repeat laboratory testing within the next 48 hours.  If she has any new changes in her condition, she is to return to the ER for immediate recheck.   -     ED COURSE & MEDICAL DECISION MAKING:         Visit Vitals  BP 118/71   Pulse (!) 112   Temp 98 F (36.7 C) (Tympanic)   Resp 16   Ht 1.651 m   Wt 67.6 kg   LMP 02/04/2020 (Approximate)   SpO2 98%   BMI 24.79 kg/m     ED Course as of 02/11/20 1801   Wed Feb 11, 2020   1444 I placed a 20-gauge peripheral IV in the right internal jugular vein under bedside ultrasound guidance.  I was able to visualize the IV catheter enter the IJ.  The carotid was not catheterized.  There is good blood return.  It is nonpulsatile. [BM]   1659 The patient is feeling significantly improved. She is hungry.She received 2 L of IV fluids. The creatinine level is elevated. We will recheck a creatinine level now and administer third liter of fluid. [BM]   1700 She is anticoagulated. D-dimer is negative. This makes pulmonary embolus extremely unlikely. [BM]   1700 Urinalysis findings  are consistent with possible UTI. The urine is cloudy with positive nitrite as well as multiple white cells and red cells. She will be treated empirically with antibiotics. [BM]   1700 She would like to go home after her results are returned. [BM]      ED Course User Index  [BM] Loyal Jacobson, MD                Patient's evaluation indicates the patient does not appear to have a need for inpatient workup and/or management. No further testing indicated acutely. Prior visits/ charts: reviewed. Nurses' notes reviewed. Pertinent lab and radiology data was reviewed by me and discussed with patient and family, if present. I had a detailed discussion about the diagnosis, workup and treatment plan, and we are in agreement.    MEDICATIONS ORDERED THIS VISIT:     Medications   sodium chloride 0.9 % bolus 1,000 mL (1,000 mLs Intravenous New Bag 02/11/20 1710)   sodium chloride 0.9 % bolus 1,000 mL (0 mLs Intravenous Stopped 02/11/20 1532)   acetaminophen (TYLENOL/OFIRMEV) injection 1,000 mg (0 mg Intravenous Stopped 02/11/20 1629)   sodium chloride 0.9 % bolus 1,000 mL (0 mLs Intravenous Stopped 02/11/20  1708)       PROCEDURES:   Procedures        CONDITION AT DISPOSITION: . Stable.               HISTORIAN:  History obtained from the patient.    CHIEF COMPLAINT:  Chief Complaint   Patient presents with    Hypotension        HISTORY OF PRESENT ILLNESS:    [name] is a 35 y.o. female who complains of weakness, lightheadedness, shortness of breath.  She states that she was diagnosed with COVID approximately 2 weeks ago.  She had a cough at that time but was not short of breath.  Over the past few days she has been feeling worse.  She has had vomiting and loose stools.  She estimates 4 or 5 episodes of vomiting yesterday.  Her chest feels tight.  No productive cough.  No fever.    She states that she has had a DVT in the right upper extremity.  Over the past day or so, her right arm swelling has  significantly improved.  She states that no specific etiology for this blood clot was ever identified as a precipitating event.   HPI -   .  REVIEW OF SYSTEMS:   Review of Systems. At least 10 systems reviewed and negative, unless otherwise specified above.    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   Past Surgical History:   Procedure Laterality Date    APPENDECTOMY (OPEN)      CHOLECYSTECTOMY      Double Barrel Enterostomy  2013    for SBO    EGD, COLONOSCOPY N/A 08/19/2019    Procedure: EGD, COLONOSCOPY;  Surgeon: Gwenith Spitz, MD;  Location: [facility];  Service: Gastroenterology;  Laterality: N/A;    Enterostomy reversal  2014    double barrel small bowel ostomies reanastomosed    ILEOSTOMY  2011    for SBO after colectomy    ILEOSTOMY CLOSURE  2012    multiple times    LAPAROTOMY, COLECTOMY, TOTAL  2011    only a rectum remains    OVARIAN CYST SURGERY  2011-2016    several    SALPINGECTOMY, OPEN Right     2011   .     IMMUNIZATIONS:  Immunization History   Administered Date(s) Administered    INFLUENZA QUAD (FLULAVAL ONLY) 6 MOS & GREATER PRESERVED 11/24/2015    Influenza Vacc QUAD Recombinant PF 24yrs & up 11/15/2018    Influenza quadrivalent (IM) 6 months & up PRESERVED (Afluria/Fluzone) 11/24/2015    Influenza quadrivalent (IM) PF 3 Yrs & greater 11/16/2016, 11/10/2019       FAMILY HISTORY:  Family History   Problem Relation Age of Onset    Hypertension Mother     Diabetes Mother  Leukemia Father     Liver disease Maternal Grandfather     Hyperlipidemia Paternal Grandfather        HOME MEDICATIONS:  Home Medications     Med List Status: Complete Set By: Bennie Dallas, RN at 02/11/2020  1:22 PM                acetaminophen (TYLENOL) 325 MG tablet     Take 2 tablets (650 mg total) by mouth every 6 (six) hours as needed for Pain     clonazePAM (KlonoPIN) 0.5 MG tablet     Take 0.25 mg by mouth daily        diphenoxylate-atropine (LOMOTIL) 2.5-0.025 MG per tablet     Take 2 tablets by mouth 4 (four) times daily as needed for Diarrhea     fluticasone-salmeterol (ADVAIR HFA) 230-21 MCG/ACT inhaler     Inhale 2 puffs into the lungs 2 (two) times daily     hydrOXYzine (ATARAX) 25 MG tablet     Take 50 mg by mouth as needed        meclizine (ANTIVERT) 25 MG tablet     Take 1 tablet (25 mg total) by mouth 3 (three) times daily as needed for Dizziness     naloxone (NARCAN) 4 MG/0.1ML nasal spray     1 spray intranasally. If pt does not respond or relapses into respiratory depression call 911. Give additional doses every 2-3 min.     ondansetron (Zofran) 4 MG tablet     Take 1 tablet (4 mg total) by mouth daily as needed for Nausea     pregabalin (LYRICA) 150 MG capsule     Take 1 capsule (150 mg total) by mouth 3 (three) times daily     tiZANidine (ZANAFLEX) 4 MG tablet     TAKE 1 TABLET BY MOUTH EVERY 8 HOURS AS NEEDED FOR MUSCLE SPASM     topiramate (TOPAMAX) 25 MG tablet     Take 25 mg by mouth every 12 (twelve) hours        traZODone (DESYREL) 300 MG tablet     Take 300 mg by mouth nightly     venlafaxine (EFFEXOR-XR) 150 MG 24 hr capsule     Take 150 mg by mouth daily     Xarelto 20 MG Tab     Take 10 tablets by mouth daily           Flagged for Removal             promethazine (PHENERGAN) 12.5 MG tablet     Take 1 tablet (12.5 mg total) by mouth every 8 (eight) hours as needed for Nausea           ALLERGY:  Allergies   Allergen Reactions    Fentanyl Anaphylaxis    Ketorolac Tromethamine  Itching    Morphine Itching    Vancomycin Other (See Comments) and Edema     throat swelling and turns red     Latex Itching    Oxycodone Hives    Oxycodone-Acetaminophen Hives    Reglan [Metoclopramide] Hives    Rocephin [Ceftriaxone] Hives    Tramadol Itching and Nausea And Vomiting    Walnuts [Tree Nuts] Other (See Comments)     diverticulitis        SOCIAL HISTORY:   Social History     Tobacco Use    Smoking status: Current Every Day Smoker     Packs/day: 1.00     Years:  20.00     Pack years: 20.00     Types: Cigarettes    Smokeless tobacco: Never Used   Vaping Use    Vaping Use: Never used   Substance Use Topics    Alcohol use: Never    Drug use: Not Currently     Types: Heroin     Comment: opiates -- last use 2013           PHYSICAL EXAMINATION:  VITAL SIGNS reviewed by me:   Vitals:    02/11/20 1710   BP:    Pulse: (!) 112   Resp: 16   Temp:    SpO2: 98%      Reviewed by the Emergency Physician  Pulse Ox Interpretation by Emergency Physician: normal   WEIGHT:   Weight Monitoring 01/26/2020 01/27/2020 02/11/2020   Height 165.1 cm 165.6 cm 165.1 cm   Height Method - - Stated   Weight 64.592 kg 64.6 kg 67.586 kg   Weight Method - Stated Stated   BMI (calculated) 23.7 kg/m2 23.6 kg/m2 24.8 kg/m2       CONSTITUTIONAL: Vital signs reviewed. Patient is nontoxic  HEAD: Atraumatic. Normocephalic.   EYES: Eyes are normal to inspection.  Discharge from eyes: none. Extraocular muscles: intact.   ENT: No facial swelling. External ears normal.    NECK: Range of motion: normal. Tenderness: none. Trachea midline. Jugular venous distention: none.   CARDIOVASCULAR: Resting tachycardia.  No murmur RESPIRATORY/CHEST: Mild tachypnea.  No stridor.  No retractions.  There are a few crackles at the right lung base.  BACK: Tenderness: none. Range of motion: normal for age. Visible abnormality: none. Palpable abnormality: none.   ABDOMEN: Soft. Distention: none. Tenderness: none. No guarding. No rebound.    RECTAL: Not  performed.   GENITALIA: Not examined.   UPPER EXTREMITY: No significant asymmetry.  No cyanosis.  LOWER EXTREMITY: Inspection normal. No edema. No cyanosis. No clubbing. Normal range of motion. No calf tenderness. Open wound: none.   NEUROLOGIC:  Awake. Alert. Strength: normal. Sensation: normal. Cranial nerves: intact. Conversation: fluent.  SKIN: Skin is normal color, warm, and dry. Skin intact. Rash: None.   LYMPHATIC: No pathologic adenopathy. No lymphangitis.   PSYCHIATRIC: Affect: anxious. Insight: normal.       Physical Exam    DATA REVIEWED:   LABS:      Results     Procedure Component Value Units Date/Time    Urine Culture [[id number]] Collected: 02/11/20 1622    Specimen: Urine, Random Updated: 02/11/20 1752    Basic Metabolic Panel [[id number]]  (Abnormal) Collected: 02/11/20 1710    Specimen: Plasma Updated: 02/11/20 1752     Sodium 139 mMol/L      Potassium 4.0 mMol/L      Chloride 113 mMol/L      CO2 19.0 mMol/L      Calcium 7.6 mg/dL      Glucose 086 mg/dL      Creatinine 5.78 mg/dL      BUN 16 mg/dL      Anion Gap 46.9 mMol/L      BUN / Creatinine Ratio 10.9 Ratio      EGFR 47 mL/min/1.18m2      Osmolality Calculated 279 mOsm/kg     Urinalysis w Microscopic and Culture if Indicated [[id number]]  (Abnormal) Collected: 02/11/20 1622    Specimen: Urine, Random Updated: 02/11/20 1655     Color, UA Yellow     Clarity, UA Cloudy     Urine Specific Gravity 1.010  pH, Urine 6.0 pH      Protein, UR Trace mg/dL      Glucose, UA Negative mg/dL      Ketones UA Negative mg/dL      Bilirubin, UA Negative mg/dL      Blood, UA 2+ mg/dL      Nitrite, UA Positive     Urobilinogen, UA 0.2 mg/dL      Leukocyte Esterase, UA Negative Leu/uL      UR Micro Performed     WBC, UA 15-20 /hpf      RBC, UA 15-20 /hpf      Bacteria, UA TNTC /hpf      Squam Epithel, UA TNTC /lpf     Narrative:      A Urine Culture has been ordered based upon the Positive UA results.    CBC and differential [[id number]]  (Abnormal) Collected:  02/11/20 1426    Specimen: Blood Updated: 02/11/20 1610     WBC 21.3 K/cmm      RBC 4.36 M/cmm      Hemoglobin 14.3 gm/dL      Hematocrit 40.9 %      MCV 99 fL      MCH 33 pg      MCHC 33 gm/dL      RDW 81.1 %      PLT CT 277 K/cmm      MPV 9.5 fL      Neutrophils % 81.0 %      Lymphocytes 5.0 %      Monocytes 13.0 %      Basophils % 1.0 %      Neutrophils Absolute 17.3 K/cmm      Lymphocytes Absolute 1.1 K/cmm      Monocytes Absolute 2.8 K/cmm      Eosinophils Absolute 0.0 K/cmm      Basophils Absolute 0.2 K/cmm      RBC Morphology RBC Morphology Reviewed     Macrocytic 1+    Narrative:      Manual differential performed    D-dimer, quantitative [[id number]] Collected: 02/11/20 1426    Specimen: Blood Updated: 02/11/20 1521     D-Dimer 0.22 mg/L FEU     Troponin I [[id number]] Collected: 02/11/20 1426    Specimen: Plasma Updated: 02/11/20 1512     Troponin I 0.02 ng/mL     Comprehensive metabolic panel [[id number]]  (Abnormal) Collected: 02/11/20 1426    Specimen: Plasma Updated: 02/11/20 1508     Sodium 137 mMol/L      Potassium 3.7 mMol/L      Chloride 106 mMol/L      CO2 21.0 mMol/L      Calcium 8.7 mg/dL      Glucose 413 mg/dL      Creatinine 2.44 mg/dL      BUN 21 mg/dL      Protein, Total 7.1 gm/dL      Albumin 3.9 gm/dL      Alkaline Phosphatase 87 U/L      ALT 12 U/L      AST (SGOT) 15 U/L      Bilirubin, Total 0.4 mg/dL      Albumin/Globulin Ratio 1.22 Ratio      Anion Gap 13.7 mMol/L      BUN / Creatinine Ratio 9.3 Ratio      EGFR 28 mL/min/1.31m2      Osmolality Calculated 277 mOsm/kg      Globulin 3.2 gm/dL     Lactic Acid [[id number]] Collected: 02/11/20 1426  Specimen: Blood Updated: 02/11/20 1454     Lactic Acid 1.4 mMol/L            Reviewed and interpreted by the Emergency Physician     RADIOLOGY:   XR Chest AP Portable    Result Date: 02/11/2020  Normal Chest. Unchanged since previous. ReadingStation:[id number]      Reviewed and interpreted by the Emergency Physician     EKG:     Interpreted by the  Emergency Physician.      CARDIAC RHYTHM INTERPRETATION from monitor:    Interpreted by the Emergency Physician       Loyal Jacobson, MD  02/11/20 1801

## 2020-02-11 NOTE — ED Notes (Signed)
Pt verbalized understanding of all discharge instructions and education.    At time of discharge, pt was A/Ox4, able to ambulate without assistance, in less pain, and her color had significantly improved. Energy level increased as well.

## 2020-02-11 NOTE — ED Notes (Addendum)
Pt reports hx of DVT in the right arm, which is normally "pretty swollen". Pt states she noticed 2 days ago that the edema was nearly gone.  Pt presents with hypotension, tachycardia, and anxiety.

## 2020-02-11 NOTE — ED Notes (Signed)
Pt up and pacing in room. Provided with IV pole so she can finish 3rd liter of fluid.

## 2020-02-11 NOTE — ED Notes (Signed)
IV attempted x 3, 2 without and 1 with U/S unsuccessful. Dr.Moyer notified.

## 2020-02-11 NOTE — ED Notes (Signed)
Pt reports feeling much better and inquiring about going home. Also requesting to eat. Explained she should not eat until after MD had decided if she was having a CT scan or not.

## 2020-02-11 NOTE — ED Notes (Signed)
Pt provided with dinner tray.

## 2020-02-11 NOTE — ED Notes (Signed)
PIV access not able to be obtained. Dr. Lajuana Ripple at bedside to look for IJ access.

## 2020-02-11 NOTE — ED Notes (Signed)
Pressure held at EJ site for an extended period of time. Tergaderm applied. No active bleeding. Pt advised not to lift anything and to not exert herself the rest of the day.

## 2020-02-11 NOTE — EDIE (Signed)
COLLECTIVE?NOTIFICATION?02/11/2020 13:06?JERRIE, SCHUSSLER M?MRN: [id number]    [facility]'s patient encounter information:   VWU:?[id number]  Account [id number]  Billing Account [id number]      Criteria Met      COVID-19 Positive Lab Results    High Utilization (6+ ED Visits/6 Mo.)    Security and Safety  No recent Security Events currently on file    ED Care Guidelines  There are currently no ED Care Guidelines for this patient. Please check your facility's medical records system.    Flags      Positive COVID-19 Lab Result - [facility] - A specimen collected from this patient was positive for COVID-19 / Attributed By: [place] Department [facility] / Attributed On: 01/29/2020       Prescription Monitoring Program  772??- Narcotic Use Score  802??- Sedative Use Score  000??- Stimulant Use Score  590??- Overdose Risk Score  - All Scores range from 000-999 with 75% of the population scoring < 200 and on 1% scoring above 650  - The last digit of the narcotic, sedative, and stimulant score indicates the number of active prescriptions of that type  - Higher Use scores correlate with increased prescribers, pharmacies, mg equiv, and overlapping prescriptions  - Higher Overdose Risk Scores correlate with increased risk of unintentional overdose death   Concerning or unexpectedly high scores should prompt a review of the PMP record; this does not constitute checking PMP for prescribing purposes.      E.D. Visit Count (12 mo.)  Facility Visits   [facility] [address] - [facility] 3   Total 18   Note: Visits indicate total known visits.     Recent Emergency Department Visit Summary  Showing 10 most recent visits out of 18 in the past 12 months  Date Facility [facility] Type Diagnoses or Chief Complaint   Feb 11, 2020 [name] [place] [facility]      low bp/weak/ bodyaches      Jan 27, 2020 [name] [place] [facility]       weakness      Weakness      COVID-19      Nov 28, 2019 [name] [place] [facility]      low blood pressure, trouble urinating, cough, weakness      Dizziness      Hypotension      Pneumonia, unspecified organism      Oct 27, 2019 [name] [place] [facility]      SOB/COVID SYMPTOMS      Arm Pain      Headache      Fever      Sore Throat      Malingerer [conscious simulation]      Procedure and treatment not carried out due to patient leaving prior to being seen by health care provider      Acute embolism and thrombosis of deep veins of right upper extremity      Oct 16, 2019 [name] [place] [facility]      chest pain      Arm Pain      Fatigue      Shortness of Breath      Pain in right arm      Other chest pain      Oct 13, 2019 [name] [place]  [facility]      neck pain      Arm Pain      Deep Venous Thrombosis      Patient's noncompliance with other medical treatment and regimen      Oct 10, 2019 [name] [place] [facility]      Arm pain      Malingerer [conscious simulation]      Opioid abuse, in remission      Pain in right arm      Oct 10, 2019 [name] [place] [facility]      Arm Pain and swelling      Arm Injury      Acute embolism and thrombosis of deep veins of right upper extremity      Sep 24, 2019 [name] [place] [facility]      Fever; Headache; Nausea      Viral infection, unspecified      Sep 13, 2019 [name] [place] [facility]      Short term memory loss, headache      Generalized Body Aches      Memory Loss      Weakness      Urinary tract infection, site not specified          Recent Inpatient Visit Summary  Date Facility [facility] Type Diagnoses or Chief Complaint   May 24, 2019 [facility] [place] [place] [facility]      Poisoning by unspecified drugs, medicaments and biological substances, accidental (unintentional), initial encounter       Fibromyalgia      May 03, 2019 [facility] [place] [place] [facility]      Unspecified intestinal obstruction, unspecified as to partial versus complete obstruction      Other chronic pain      Unspecified abdominal pain      Other specified functional intestinal disorders      Generalized anxiety disorder      Fibromyalgia      Bipolar disorder, unspecified      Mar 11, 2019 [facility] [place] [place] [facility]      Unspecified intestinal obstruction, unspecified as to partial versus complete obstruction      Mar 08, 2019 [facility]. [place]      Unspecified intestinal obstruction, unspecified as to partial versus complete obstruction          Care Team  Provider Specialty Phone Fax Service Dates   Adline Mango, MD Family Medicine [phone] Jan 24, 2019 - Current      Collective Portal  This patient has registered at the [facility] Emergency Department   For more information visit: [email or web address]     PLEASE NOTE:     1.   Any care recommendations and other clinical information are provided as guidelines or for historical purposes only, and providers should exercise their own clinical judgment when providing care.    2.   You may only use this information for purposes of treatment, payment or health care operations activities, and subject to the limitations of applicable Collective Policies.    3.   You should consult directly with the organization that provided a care guideline or other clinical history with any questions about additional information or accuracy or completeness of information provided.    ? 2022 [place], [employer]. -  [email or web address]

## 2020-02-12 LAB — ECG 12-LEAD
P Wave Axis: 86 deg
P-R Interval: 116 ms
Patient Age: 34 years
Q-T Interval(Corrected): 428 ms
Q-T Interval: 316 ms
QRS Axis: 84 deg
QRS Duration: 87 ms
T Axis: 26 years
Ventricular Rate: 110 //min

## 2020-02-15 LAB — VH CULTURE, URINE

## 2020-02-15 NOTE — Progress Notes (Signed)
Multiply resistant E. 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

## 2020-02-17 ENCOUNTER — Ambulatory Visit: Payer: Medicare Managed Care Other | Attending: Nurse Practitioner | Admitting: Nurse Practitioner

## 2020-02-17 ENCOUNTER — Telehealth (RURAL_HEALTH_CENTER): Payer: Self-pay | Admitting: Nurse Practitioner

## 2020-02-17 ENCOUNTER — Encounter (RURAL_HEALTH_CENTER): Payer: Self-pay | Admitting: Nurse Practitioner

## 2020-02-17 VITALS — BP 104/54 | HR 89 | Ht 65.0 in | Wt 140.0 lb

## 2020-02-17 DIAGNOSIS — N179 Acute kidney failure, unspecified: Secondary | ICD-10-CM

## 2020-02-17 DIAGNOSIS — N3000 Acute cystitis without hematuria: Secondary | ICD-10-CM

## 2020-02-17 DIAGNOSIS — Z09 Encounter for follow-up examination after completed treatment for conditions other than malignant neoplasm: Secondary | ICD-10-CM

## 2020-02-17 MED ORDER — SULFAMETHOXAZOLE-TRIMETHOPRIM 800-160 MG PO TABS
1.0000 | ORAL_TABLET | Freq: Two times a day (BID) | ORAL | 0 refills | Status: AC
Start: 2020-02-17 — End: 2020-02-24

## 2020-02-17 NOTE — Telephone Encounter (Signed)
Patient called stating she could not take the antibiotic that was prescribed as it is making her very sick and was wondering if she could get something else instead.

## 2020-02-18 ENCOUNTER — Ambulatory Visit (INDEPENDENT_AMBULATORY_CARE_PROVIDER_SITE_OTHER): Payer: Medicare Managed Care Other | Admitting: Vascular Surgery

## 2020-02-18 ENCOUNTER — Ambulatory Visit: Admission: RE | Admit: 2020-02-18 | Source: Ambulatory Visit | Attending: Vascular Surgery | Admitting: Vascular Surgery

## 2020-02-18 ENCOUNTER — Encounter (INDEPENDENT_AMBULATORY_CARE_PROVIDER_SITE_OTHER): Payer: Self-pay

## 2020-02-20 ENCOUNTER — Encounter (RURAL_HEALTH_CENTER): Payer: Self-pay | Admitting: Nurse Practitioner

## 2020-02-20 NOTE — Patient Instructions (Signed)
Antibiotics are used to kill bacteria that cause infections. The most important thing is to finish all of your medications even if you feel better.    Activity: Rest; avoid strenuous activity. Avoid sexual activity until you finish the antibiotic.     Diet: Increase fluids, drink at least one large glass of liquid every hour. Avoid foods that irritate the bladder: caffeine, alcohol, and spicy foods.     Medications: Your antibiotic is bactrim 1 tablet twice daily x 7 days, make sure you take all of your antibiotic, not just when you feel better.  Take acetaminophen (Tylenol) for fever.   You may be prescribed a medication to prevent bladder spasms and pain while urinating. phenazopyridine (Pyridium) 100 mg three times a day for 2 days. This medication will change the color of your urine to urine, do not be alarmed.     You need to notify the office if you have:   Worsening symptoms or symptoms not improving during treatment.  Fever higher than 100.4.  Blood in urine; your urine color will be red-orange with the medication for your bladder discomfort.   Recurring symptoms after finishing all of your mediations: painful urination, back pain, fever, chills, or nausea.

## 2020-02-20 NOTE — Progress Notes (Signed)
PROGRESS NOTE      Patient Name: [name],[name]  Primary Care Physician: Kathi Simpers, NP      History of Presenting Illness:   [name] is a 35 y.o. female who presents to the office with   Chief Complaint   Patient presents with    acute kidney injury     [facility] ER F/U 02/11/2020    Urinary Tract Infection Symptoms    Dehydration     ER follow-up:  Patient presents today for a ER follow-up from [facility] on 02/11/2019.  She was experiencing generalized weakness and hypotension. It was found that she had an acute kidney injury and a urinary tract infection. She was given several liters of fluids and her kidney function improved. She reports they prescribed her an antibiotic but she became ill with taking this medication and requests another medication.     This visit was conducted with the use of interactive audio/video telecommunications that permitted real-time communication between [employer] and myself. She consented to practice patient and received services at home, while I was located at [facility]. [facility].    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; No COLON present    Fibromyalgia     Gastroesophageal reflux disease     Insomnia     Opiate abuse, episodic     Seasonal allergic rhinitis        Past Surgical History:     Past Surgical History:   Procedure Laterality Date    APPENDECTOMY (OPEN)      CHOLECYSTECTOMY      Double Barrel Enterostomy  2013    for SBO    EGD, COLONOSCOPY N/A 08/19/2019    Procedure: EGD, COLONOSCOPY;  Surgeon: Gwenith Spitz, MD;  Location: [facility];  Service:  Gastroenterology;  Laterality: N/A;    Enterostomy reversal  2014    double barrel small bowel ostomies reanastomosed    ILEOSTOMY  2011    for SBO after colectomy    ILEOSTOMY CLOSURE  2012    multiple times    LAPAROTOMY, COLECTOMY, TOTAL  2011    only a rectum remains    OVARIAN CYST SURGERY  2011-2016    several    SALPINGECTOMY, OPEN Right     2011       Family History:     Family History   Problem Relation Age of Onset    Hypertension Mother     Diabetes Mother     Leukemia Father     Liver disease Maternal Grandfather     Hyperlipidemia Paternal Grandfather        Social History:     Social History     Tobacco Use   Smoking Status Current Every Day Smoker    Packs/day: 1.00    Years: 20.00    Pack years: 20.00    Types: Cigarettes   Smokeless Tobacco Never Used     Social History     Substance and Sexual Activity  Alcohol Use Never     Social History     Substance and Sexual Activity   Drug Use Not Currently    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 Authorizing Provider   acetaminophen (TYLENOL) 325 MG tablet Take 2 tablets (650 mg total) by mouth every 6 (six) hours as needed for Pain 10/10/19  Yes Fadia, Artis Delay, MD   clonazePAM (KlonoPIN) 0.5 MG tablet Take 0.25 mg by mouth daily    11/07/19  Yes [provider]   diphenoxylate-atropine (LOMOTIL) 2.5-0.025 MG per tablet Take 2 tablets by mouth 4 (four) times daily as  needed for Diarrhea 01/01/20  Yes Marja Kays T, NP   fluticasone-salmeterol (ADVAIR HFA) 230-21 MCG/ACT inhaler Inhale 2 puffs into the lungs 2 (two) times daily 12/13/18  Yes Yvett Rossel T, NP   hydrOXYzine (ATARAX) 25 MG tablet Take 50 mg by mouth as needed    07/18/19  Yes [provider]   meclizine (ANTIVERT) 25 MG tablet Take 1 tablet (25 mg total) by mouth 3 (three) times daily as needed for Dizziness 01/12/20  Yes Lenny Bouchillon T, NP   ondansetron (Zofran) 4 MG tablet Take 1 tablet (4 mg total) by mouth daily as needed for Nausea 12/24/19 12/23/20 Yes York Cerise, FNP   pregabalin (LYRICA) 150 MG capsule Take 1 capsule (150 mg total) by mouth 3 (three) times daily 01/26/20 01/25/21 Yes Brittiany Wiehe T, NP   tiZANidine (ZANAFLEX) 4 MG tablet TAKE 1 TABLET BY MOUTH EVERY 8 HOURS AS NEEDED FOR MUSCLE SPASM 02/06/20  Yes Taneika Choi T, NP   topiramate (TOPAMAX) 25 MG tablet Take 25 mg by mouth every 12 (twelve) hours      Yes [provider]   traZODone (DESYREL) 300 MG tablet Take 300 mg by mouth nightly   Yes [provider]   venlafaxine (EFFEXOR-XR) 150 MG 24 hr capsule Take 150 mg by mouth daily   Yes [provider]   Xarelto 20 MG Tab Take 10 tablets by mouth daily 12/09/19  Yes [provider]   naloxone (NARCAN) 4 MG/0.1ML nasal spray 1 spray intranasally. If pt does not respond or relapses into respiratory depression call 911. Give additional doses every 2-3 min. 03/10/19   Arther Abbott, MD   sulfamethoxazole-trimethoprim (Bactrim DS) 800-160 MG per tablet Take 1 tablet by mouth 2 (two) times daily for 7 days 02/17/20 02/24/20  Marja Kays T, NP       Review of Systems:      14 system review negative except as noted in the HPI.    Physical Exam:     Vitals:    02/17/20 1320   BP: 104/54   Pulse: 89  SpO2: 99%     Body mass index is 23.3 kg/m.    General:  no acute distress. Pleasant and well groomed.  Neck: no JVD, no tracheal deviation  Lungs: Effort  normal, no use accessory muscles.  Neuro:  alert and oriented.   Skin: no rashes or lesions noted  Psychiatric/Behavioral: appropriate affect, mood and behavior.    Assessment:     1. Acute renal failure, unspecified acute renal failure type    2. Acute cystitis without hematuria        Plan:   Patient Instructions   Antibiotics are used to kill bacteria that cause infections. The most important thing is to finish all of your medications even if you feel better.    Activity: Rest; avoid strenuous activity. Avoid sexual activity until you finish the antibiotic.     Diet: Increase fluids, drink at least one large glass of liquid every hour. Avoid foods that irritate the bladder: caffeine, alcohol, and spicy foods.     Medications: Your antibiotic is bactrim 1 tablet twice daily x 7 days, make sure you take all of your antibiotic, not just when you feel better.  Take acetaminophen (Tylenol) for fever.   You may be prescribed a medication to prevent bladder spasms and pain while urinating. phenazopyridine (Pyridium) 100 mg three times a day for 2 days. This medication will change the color of your urine to urine, do not be alarmed.     You need to notify the office if you have:   Worsening symptoms or symptoms not improving during treatment.  Fever higher than 100.4.  Blood in urine; your urine color will be red-orange with the medication for your bladder discomfort.   Recurring symptoms after finishing all of your mediations: painful urination, back pain, fever, chills, or nausea.          Requested Prescriptions     Signed Prescriptions Disp Refills    sulfamethoxazole-trimethoprim (Bactrim DS) 800-160 MG per tablet 14 tablet 0     Sig: Take 1 tablet by mouth 2 (two) times daily for 7 days        Patient was counseled on possible medicine side effects which may include rash, swelling and/or stomach upset.  Patient was instructed to notify me or the ER if they experience problems.    Orders Placed This Encounter    Procedures    Basic Metabolic Panel     Standing Status:   Future     Standing Expiration Date:   02/16/2021     Order Specific Question:   Has the patient fasted?     Answer:   Yes     Order Specific Question:   Release to patient     Answer:   Immediate        Before leaving the office, the patient was informed of all ordered diagnostic tests and consults.     If you have not heard back from [place] about test results in 14 days, please call the office at [phone].    Signed by: Kathi Simpers, NP, NP    Supervising Doctor: Rogelio Seen, MD    The patient's electronic medical record was reviewed, any changes in the past medical history, past surgical history, medications, diagnostic tests were noted, and the record was updated accordingly.     Discussed option available for my chart which provides electronic access to diagnostic results.

## 2020-03-01 ENCOUNTER — Encounter (INDEPENDENT_AMBULATORY_CARE_PROVIDER_SITE_OTHER): Payer: Self-pay

## 2020-03-05 ENCOUNTER — Other Ambulatory Visit (RURAL_HEALTH_CENTER): Payer: Self-pay | Admitting: Nurse Practitioner

## 2020-03-05 DIAGNOSIS — K912 Postsurgical malabsorption, not elsewhere classified: Secondary | ICD-10-CM

## 2020-03-09 ENCOUNTER — Ambulatory Visit (INDEPENDENT_AMBULATORY_CARE_PROVIDER_SITE_OTHER): Payer: Medicare Managed Care Other | Admitting: Vascular Surgery

## 2020-03-14 ENCOUNTER — Other Ambulatory Visit (RURAL_HEALTH_CENTER): Payer: Self-pay | Admitting: Nurse Practitioner

## 2020-03-14 DIAGNOSIS — M7541 Impingement syndrome of right shoulder: Secondary | ICD-10-CM

## 2020-03-16 ENCOUNTER — Other Ambulatory Visit (RURAL_HEALTH_CENTER): Payer: Self-pay | Admitting: Nurse Practitioner

## 2020-03-23 ENCOUNTER — Encounter (RURAL_HEALTH_CENTER): Payer: Self-pay | Admitting: Nurse Practitioner

## 2020-03-23 ENCOUNTER — Ambulatory Visit: Payer: Medicare Managed Care Other | Attending: Nurse Practitioner | Admitting: Nurse Practitioner

## 2020-03-23 VITALS — Resp 20 | Ht 65.0 in | Wt 140.0 lb

## 2020-03-23 DIAGNOSIS — J309 Allergic rhinitis, unspecified: Secondary | ICD-10-CM

## 2020-03-23 DIAGNOSIS — J069 Acute upper respiratory infection, unspecified: Secondary | ICD-10-CM

## 2020-03-23 MED ORDER — FLUTICASONE PROPIONATE 50 MCG/ACT NA SUSP
1.0000 | Freq: Every day | NASAL | 2 refills | Status: AC
Start: 2020-03-23 — End: ?

## 2020-03-23 MED ORDER — CLINDAMYCIN HCL 300 MG PO CAPS
300.0000 mg | ORAL_CAPSULE | Freq: Three times a day (TID) | ORAL | 0 refills | Status: AC
Start: 2020-03-23 — End: 2020-03-30

## 2020-03-23 MED ORDER — CETIRIZINE HCL 10 MG PO TABS
10.0000 mg | ORAL_TABLET | Freq: Every day | ORAL | 3 refills | Status: DC
Start: 2020-03-23 — End: 2020-06-03

## 2020-03-23 NOTE — Patient Instructions (Signed)
URI:  Most likely allergic/viral in nature.   I would recommend that you wait a day or two before filling your antibiotic to trial the Zyrtec and Flonase to see if this improves your symptoms.     Continue to rest as much as possible until you feel stronger. Dont let yourself get overly tired when you go back to your activities.  Avoid cigarette smoke or other irritants to the lungs. Consider smoking cessation if you smoke as it lowers your ability to fight infection.  Continue to use Tylenol or Ibuprofen for pain/fever.  Drink plenty of fluids (water).  Elevate head of bed.  Do Nasal Saline Irrigation twice daily until symptoms resolve.    To perform nasal saline irrigation:   A.   Use city water or bottled drinking water     NOT WELL WATER OR DISTILLED WATER.   B.   Use WARM water.      Hot water will burn your nose and cold water will give you a bad "ice cream headache.   C.   Mix one container of water and one packet of salts    (salt packets come with neti pot or squeeze bottle and can be purchased separately).    If you do not have salt packets, you may make your own solution by mixing     1 cup water + 1/4 tsp salt + 1/4 tsp baking soda    Neti Pot:      Stand over sink with head tilted to the side.  The tip of the neti pot goes in one nostril and the liquid is poured in one nostril and comes out the other       Nostril.  Repeat on the other side.    Squeeze Bottle:   Stand over sink with head bent slightly forward, mouth open and throat relaxed.  The tip of the bottle goes in one nostril and the bottle is gently squeezed.   The liquid goes in one nostril and comes out the mouth.    Repeat on the other side.     Regardless of which technique you use -   It may make you cough or gag briefly and may burn or sting behind your eyes for a few moments.  This should pass rapidly.   If you swallow some of the liquid, it will NOT harm you.   If you are prescribed a nasal spray to use, use the spray AFTER the sinus  rinse,  NOT before (as you will wash out the medicine).    You do not need to do sinus irrigation if you are NOT having symptoms.  Do sinus irrigation once daily for mild symptoms.  Do sinus irrigation twice daily for moderate or severe symptoms.    Avoid acidic food and fluids, like orange juice or tomatoes.  Avoid milk while you have congestion.  Salt water gargle (1/2 teaspoon salt in 1 cup of warm water)or honey to soothe throat.  May use a humidifier.   Wash your hands frequently! This helps prevent the spread of germs.  Follow-up as needed.

## 2020-03-23 NOTE — Progress Notes (Signed)
PROGRESS NOTE      Patient Name: [name],[name]  Primary Care Physician: Kathi Simpers, NP      History of Presenting Illness:   [name] is a 35 y.o. female who presents to the office with   Chief Complaint   Patient presents with    Cough    Headache     yellow green phlegm     Cough  Patient complains of chills, dyspnea, facial pain frontal, headache frontal, nasal congestion and productive cough with sputum described as yellow and green. She feels like her lungs are "rattely" and wonders if she has pneumonia. She has also noticed dizziness and nose bleeds. Symptoms began 2 weeks ago. Symptoms have been gradually worsening since that time. Sick contacts include her neighbor whom she spends a lot of time with. The cough is productive and is aggravated by cold air, stress and reclining position. Associated symptoms include: postnasal drip. Patient reports she is down to 5 cigarettes a day. Denies fever. She reports she took a home covid test which was negative.     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; No COLON present    Fibromyalgia     Gastroesophageal reflux disease     Insomnia     Opiate abuse, episodic     Seasonal allergic rhinitis        Past Surgical History:     Past Surgical History:   Procedure Laterality Date    APPENDECTOMY (OPEN)      CHOLECYSTECTOMY      Double Barrel Enterostomy  2013    for SBO    EGD, COLONOSCOPY N/A 08/19/2019    Procedure: EGD, COLONOSCOPY;  Surgeon: Gwenith Spitz, MD;  Location: [facility];  Service: Gastroenterology;  Laterality: N/A;    Enterostomy reversal  2014    double  barrel small bowel ostomies reanastomosed    ILEOSTOMY  2011    for SBO after colectomy    ILEOSTOMY CLOSURE  2012    multiple times    LAPAROTOMY, COLECTOMY, TOTAL  2011    only a rectum remains    OVARIAN CYST SURGERY  2011-2016    several    SALPINGECTOMY, OPEN Right     2011       Family History:     Family History   Problem Relation Age of Onset    Hypertension Mother     Diabetes Mother     Leukemia Father     Liver disease Maternal Grandfather     Hyperlipidemia Paternal Grandfather        Social History:     Social History     Tobacco Use   Smoking Status Current Every Day Smoker    Packs/day: 1.00    Years: 20.00    Pack years: 20.00    Types: Cigarettes   Smokeless Tobacco Never Used     Social History     Substance and Sexual Activity   Alcohol Use Never     Social  History     Substance and Sexual Activity   Drug Use Not Currently    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 Authorizing Provider   acetaminophen (TYLENOL) 325 MG tablet Take 2 tablets (650 mg total) by mouth every 6 (six) hours as needed for Pain 10/10/19  Yes Fadia, Ankur S, MD   clonazePAM (KlonoPIN) 0.5 MG tablet Take 0.25 mg by mouth daily    11/07/19  Yes [provider]   diphenoxylate-atropine (LOMOTIL) 2.5-0.025 MG per tablet TAKE 2 TABLETS BY MOUTH 4 TIMES DAILY 03/09/20  Yes Marja Kays T, NP   fluticasone-salmeterol (ADVAIR HFA) 230-21 MCG/ACT  inhaler Inhale 2 puffs into the lungs 2 (two) times daily 12/13/18  Yes Chelsy Parrales T, NP   meclizine (ANTIVERT) 25 MG tablet Take 1 tablet (25 mg total) by mouth 3 (three) times daily as needed for Dizziness 01/12/20  Yes Ilanna Deihl T, NP   naloxone (NARCAN) 4 MG/0.1ML nasal spray 1 spray intranasally. If pt does not respond or relapses into respiratory depression call 911. Give additional doses every 2-3 min. 03/10/19  Yes Arther Abbott, MD   ondansetron (Zofran) 4 MG tablet Take 1 tablet (4 mg total) by mouth daily as needed for Nausea 12/24/19 12/23/20 Yes York Cerise, FNP   pregabalin (LYRICA) 150 MG capsule Take 1 capsule (150 mg total) by mouth 3 (three) times daily 01/26/20 01/25/21 Yes Keora Eccleston T, NP   tiZANidine (ZANAFLEX) 4 MG tablet TAKE 1 TABLET BY MOUTH EVERY 8 HOURS AS NEEDED FOR MUSCLE SPASM 03/15/20  Yes Caison Hearn T, NP   topiramate (TOPAMAX) 25 MG tablet Take 25 mg by mouth every 12 (twelve) hours      Yes [provider]   traZODone (DESYREL) 300 MG tablet Take 300 mg by mouth nightly   Yes [provider]   venlafaxine (EFFEXOR-XR) 150 MG 24 hr capsule Take 150 mg by mouth daily   Yes [provider]   Xarelto 20 MG Tab Take 10 tablets by mouth daily 12/09/19  Yes [provider]   hydrOXYzine (ATARAX) 25 MG tablet Take 50 mg by mouth as needed    07/18/19 03/23/20  [provider]       Review of Systems:      14 system review negative except as noted in the HPI.      Physical Exam:     Vitals:    03/23/20 1024   Resp: 20     Body mass index is 23.3 kg/m.    General:  no acute distress. Pleasant and well groomed.  Neck: no JVD, no tracheal deviation  Lungs: Effort normal, no use accessory muscles. No dyspnea noted during ambulation.  Neuro:  alert and oriented.   Musculoskeletal: able to ambulate without difficulty  Skin: no rashes or lesions noted  Psychiatric/Behavioral: appropriate affect, mood and behavior.    Assessment:     1. Upper  respiratory tract infection, unspecified type    2. Allergic rhinitis, unspecified seasonality, unspecified trigger        Plan:   Patient Instructions   URI:  Most likely allergic/viral in nature.   I would recommend that you wait a day or two before filling your antibiotic to trial the Zyrtec and Flonase to see if this improves your symptoms.     Continue to rest as much as possible until you feel stronger. Dont let yourself get overly tired when you go back to your activities.  Avoid cigarette smoke or other irritants to the lungs. Consider smoking cessation if you smoke as it lowers your ability to fight infection.  Continue to use Tylenol or Ibuprofen for pain/fever.  Drink plenty of fluids (water).  Elevate head of bed.  Do Nasal Saline Irrigation twice daily until symptoms resolve.    To perform nasal saline irrigation:   A.   Use city water or bottled drinking water     NOT WELL WATER OR DISTILLED WATER.   B.   Use WARM water.      Hot water will burn your nose and cold water will give you a bad "ice cream headache.   C.   Mix one container of water and one packet of salts    (salt packets come with neti pot or squeeze bottle and can be purchased separately).    If you do not have salt packets, you may make your own solution by mixing     1 cup water + 1/4 tsp salt + 1/4 tsp baking soda    Neti Pot:      Stand over sink with head tilted to the side.  The tip of the neti pot goes in one nostril and the liquid is poured in one nostril and comes out the other       Nostril.  Repeat on the other side.    Squeeze Bottle:   Stand over sink with head bent slightly forward, mouth open and throat relaxed.  The tip of the bottle goes in one nostril and the bottle is gently squeezed.   The liquid goes in one nostril and comes out the mouth.    Repeat on the other side.     Regardless of which technique you use -   It may make you cough or gag briefly and may burn or sting behind your eyes for a few moments.  This  should pass rapidly.   If you swallow some of the liquid, it will NOT harm you.   If you are prescribed a nasal spray to use, use the spray AFTER the sinus rinse,  NOT before (as you will wash out the medicine).    You do not need to do sinus irrigation if you are NOT having symptoms.  Do sinus irrigation once daily for mild symptoms.  Do sinus irrigation twice daily for moderate or severe symptoms.    Avoid acidic food and fluids, like orange juice or tomatoes.  Avoid milk while you have congestion.  Salt water gargle (1/2 teaspoon salt in 1 cup of warm water)or honey to soothe throat.  May use a humidifier.   Wash your hands frequently! This helps prevent the spread of germs.  Follow-up as needed.  Requested Prescriptions     Signed Prescriptions Disp Refills    fluticasone (FLONASE) 50 MCG/ACT nasal spray 18.2 mL 2     Sig: 1 spray by Nasal route daily    cetirizine (ZyrTEC Allergy) 10 MG tablet 90 tablet 3     Sig: Take 1 tablet (10 mg total) by mouth daily    clindamycin (Cleocin) 300 MG capsule 21 capsule 0     Sig: Take 1 capsule (300 mg total) by mouth 3 (three) times daily for 7 days        Patient was counseled on possible medicine side effects which may include rash, swelling and/or stomach upset.  Patient was instructed to notify me or the ER if they experience problems.    No orders of the defined types were placed in this encounter.       Before leaving the office, the patient was informed of all ordered diagnostic tests and consults.     If you have not heard back from [place] about test results in 14 days, please call the office at [phone].    Signed by: Kathi Simpers, NP, NP    Supervising Doctor: Rogelio Seen, MD    The patient's electronic medical record was reviewed, any changes in the past medical history, past surgical history, medications, diagnostic tests were noted, and the record was updated accordingly.     Discussed option available for my chart which provides  electronic access to diagnostic results.

## 2020-03-25 ENCOUNTER — Ambulatory Visit (RURAL_HEALTH_CENTER): Payer: Medicare Managed Care Other | Admitting: Nurse Practitioner

## 2020-04-12 ENCOUNTER — Telehealth (RURAL_HEALTH_CENTER): Payer: Self-pay

## 2020-04-12 NOTE — Telephone Encounter (Signed)
Fax received from pharmacy for tizanidine.     Fill Dates:  02/06/20-30 day supply used insurance  02/16/2020-4 days-cash, said left at moms and was being mailed.  03/03/20-26 days filled used insurance  03/16/20-2 days- paid cash  03/23/20-90 days insurance  04/07/2020- 4 days filled-says kicked out of the house.    In the last 62 days, patient has filled 156 days worth of medication. Picked up 3/16 and states she just wanted 2 more days worth because she has cash- asked about 90 day fill and says she got kicked out of her home and couldn't get her meds. Was told no more until we discuss w/ provider.     Discussed with provider and patient will be discharged due to drug seeking behavior. [facility] Manager aware. CNR

## 2020-04-23 ENCOUNTER — Ambulatory Visit (RURAL_HEALTH_CENTER): Payer: Medicare Managed Care Other | Admitting: Nurse Practitioner

## 2020-05-27 ENCOUNTER — Emergency Department
Admission: EM | Admit: 2020-05-27 | Discharge: 2020-05-27 | Disposition: A | Discharge status: Home / Self Care | Payer: Medicare Managed Care Other | Attending: Emergency Medicine | Admitting: Emergency Medicine

## 2020-05-27 DIAGNOSIS — L989 Disorder of the skin and subcutaneous tissue, unspecified: Secondary | ICD-10-CM | POA: Insufficient documentation

## 2020-05-27 MED ORDER — SILVER SULFADIAZINE 1 % EX CREA
TOPICAL_CREAM | Freq: Every day | CUTANEOUS | Status: DC
Start: 2020-05-27 — End: 2020-05-27

## 2020-05-27 MED ORDER — SILVER SULFADIAZINE 1 % EX CREA
TOPICAL_CREAM | CUTANEOUS | Status: AC
Start: 2020-05-27 — End: ?
  Filled 2020-05-27: qty 50
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
## 2020-05-27 NOTE — EDIE (Signed)
COLLECTIVE?NOTIFICATION?05/27/2020 15:10?[name], [name]?MRN: [id number]    [facility]'s patient encounter information:   VWU:[id number]  Account [id number]  Billing Account [id number]      Criteria Met      Narx Scores Alert    Security and Safety  No recent Security Events currently on file    ED Care Guidelines  There are currently no ED Care Guidelines for this patient. Please check your facility's medical records system.        Prescription Monitoring Program  600??- Narcotic Use Score  742??- Sedative Use Score  000??- Stimulant Use Score  530??- Overdose Risk Score  - All Scores range from 000-999 with 75% of the population scoring < 200 and on 1% scoring above 650  - The last digit of the narcotic, sedative, and stimulant score indicates the number of active prescriptions of that type  - Higher Use scores correlate with increased prescribers, pharmacies, mg equiv, and overlapping prescriptions  - Higher Overdose Risk Scores correlate with increased risk of unintentional overdose death   Concerning or unexpectedly high scores should prompt a review of the PMP record; this does not constitute checking PMP for prescribing purposes.      E.D. Visit Count (12 mo.)  Facility Visits   [id number] [place] [place] [facility] 11   Total 11   Note: Visits indicate total known visits.     Recent Emergency Department Visit Summary  Showing 10 most recent visits out of 11 in the past 12 months  Date Facility [facility] Type Diagnoses or Chief Complaint   May 27, 2020 [name] [place] [facility]      Insect Bite      Feb 11, 2020 [name] [place] [facility]      low bp/weak/ bodyaches      Hypotension      Dehydration      COVID-19      Acute kidney failure, unspecified      Urinary tract infection, site not specified      Jan 27, 2020 [name] [place] [facility]      weakness      Weakness      COVID-19      Nov 28, 2019 [name] [place] [facility]      low blood pressure, trouble urinating, cough, weakness      Dizziness      Hypotension      Pneumonia, unspecified organism      Oct 27, 2019 [name] [place] [facility]      SOB/COVID SYMPTOMS      Arm Pain      Headache      Fever      Sore Throat      Malingerer [conscious simulation]      Procedure and treatment not carried out due to patient leaving prior to being seen by health care provider      Acute embolism and thrombosis of deep veins of right upper extremity      Oct 16, 2019 [name]      chest pain      Arm Pain      Fatigue      Shortness of Breath      Pain in right arm      Other chest pain      Oct 13, 2019  [name] [place] [facility]      neck pain      Arm Pain      Deep Venous Thrombosis      Patient's noncompliance with other medical treatment and regimen      Oct 10, 2019 [name] [place] [facility]      Arm pain      Malingerer [conscious simulation]      Opioid abuse, in remission      Pain in right arm      Oct 10, 2019 [name] [place] [facility]      Arm Pain and swelling      Arm Injury      Acute embolism and thrombosis of deep veins of right upper extremity      Sep 24, 2019 [name] [place] [facility]      Fever; Headache; Nausea      Viral infection, unspecified          Recent Inpatient Visit Summary  No recorded inpatient visits.     Care Team  Provider Specialty Phone Fax Service Dates   Adline Mango, MD Family Medicine [phone] Jan 24, 2019 - Current      Collective Portal  This patient has registered at the [facility] Emergency Department   For more information visit: [email or web address]     PLEASE NOTE:     1.   Any care recommendations and other clinical information are provided as guidelines or for historical purposes  only, and providers should exercise their own clinical judgment when providing care.    2.   You may only use this information for purposes of treatment, payment or health care operations activities, and subject to the limitations of applicable Collective Policies.    3.   You should consult directly with the organization that provided a care guideline or other clinical history with any questions about additional information or accuracy or completeness of information provided.    ? 2022 [place], [employer] - [email or web address]

## 2020-05-27 NOTE — ED Student (Signed)
History     Chief Complaint   Patient presents with    Skin Ulcer     [name] is 35 y.o. female who presents for evaluation of a skin ulcer on her right central upper back. Pt reports three nights ago she felt a sharp pain while laying in bed that formed into a painful blister on the following morning. She had her partner pop the blister which drained clear fluid and became more erythematous and painful. Pt denies SOB, fever, chills, leg swelling, or new rashes. She also denies any trauma or burn to the affected area.       The history is provided by the patient. No language interpreter was used.       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     No mechanical SBO - atonic bowel with 6 hour SB transit time; No COLON present    Fibromyalgia     Gastroesophageal reflux disease     Insomnia     Opiate abuse, episodic     Seasonal allergic rhinitis        Past Surgical History:   Procedure Laterality Date    APPENDECTOMY (OPEN)      CHOLECYSTECTOMY      Double Barrel Enterostomy  2013    for SBO    EGD, COLONOSCOPY N/A 08/19/2019    Procedure: EGD, COLONOSCOPY;  Surgeon: Gwenith Spitz, MD;  Location: [facility];  Service: Gastroenterology;  Laterality: N/A;    Enterostomy reversal  2014    double barrel small bowel ostomies reanastomosed    ILEOSTOMY  2011    for SBO after colectomy    ILEOSTOMY CLOSURE  2012    multiple times    LAPAROTOMY, COLECTOMY, TOTAL  2011    only a rectum remains    OVARIAN CYST SURGERY  2011-2016    several    SALPINGECTOMY, OPEN Right     2011       Family History   Problem Relation Age of Onset    Hypertension Mother     Diabetes Mother     Leukemia Father     Liver disease Maternal  Grandfather     Hyperlipidemia Paternal Grandfather        Social History     Tobacco Use    Smoking status: Current Every Day Smoker     Packs/day: 1.00     Years: 20.00     Pack years: 20.00     Types: Cigarettes    Smokeless tobacco: Never Used   Vaping Use    Vaping Use: Never used   Substance Use Topics    Alcohol use: Never    Drug use: Not Currently     Types: Heroin     Comment: opiates -- last use 2013       Review of Systems   Constitutional: Positive for fatigue. Negative for chills and fever.   Cardiovascular: Negative for leg swelling.   Skin: Positive for wound.   Psychiatric/Behavioral: Positive for agitation.       Physical Exam   BP 109/84    Pulse 99    Temp  98.4 F (36.9 C)    Resp 18    Ht 1.651 m    Wt 65 kg    LMP 05/23/2020    SpO2 97%    BMI 23.85 kg/m     Physical Exam  Constitutional:       General: She is not in acute distress.     Comments: Pt presented with increased psychomotor agitation.    Skin:     General: Skin is dry.      Coloration: Skin is pale.      Findings: Lesion present.         ED Course

## 2020-05-29 ENCOUNTER — Emergency Department
Admission: EM | Admit: 2020-05-29 | Discharge: 2020-05-29 | Disposition: A | Discharge status: Other Acute Inpatient Hospital | Payer: Medicare Managed Care Other | Attending: Emergency Medicine | Admitting: Emergency Medicine

## 2020-05-29 ENCOUNTER — Emergency Department: Payer: Medicare Managed Care Other

## 2020-05-29 ENCOUNTER — Inpatient Hospital Stay
Admission: EM | Admit: 2020-05-29 | Discharge: 2020-06-03 | DRG: 885 | Disposition: A | Discharge status: Home / Self Care | Payer: Medicare Managed Care Other | Source: Other Acute Inpatient Hospital | Attending: Psychiatry | Admitting: Psychiatry

## 2020-05-29 DIAGNOSIS — Z83438 Family history of other disorder of lipoprotein metabolism and other lipidemia: Secondary | ICD-10-CM

## 2020-05-29 DIAGNOSIS — Z79899 Other long term (current) drug therapy: Secondary | ICD-10-CM

## 2020-05-29 DIAGNOSIS — F319 Bipolar disorder, unspecified: Secondary | ICD-10-CM

## 2020-05-29 DIAGNOSIS — F411 Generalized anxiety disorder: Secondary | ICD-10-CM | POA: Diagnosis present

## 2020-05-29 DIAGNOSIS — Z91018 Allergy to other foods: Secondary | ICD-10-CM

## 2020-05-29 DIAGNOSIS — Z885 Allergy status to narcotic agent status: Secondary | ICD-10-CM

## 2020-05-29 DIAGNOSIS — F1110 Opioid abuse, uncomplicated: Secondary | ICD-10-CM | POA: Diagnosis present

## 2020-05-29 DIAGNOSIS — Z818 Family history of other mental and behavioral disorders: Secondary | ICD-10-CM

## 2020-05-29 DIAGNOSIS — R45851 Suicidal ideations: Secondary | ICD-10-CM | POA: Insufficient documentation

## 2020-05-29 DIAGNOSIS — J302 Other seasonal allergic rhinitis: Secondary | ICD-10-CM | POA: Diagnosis present

## 2020-05-29 DIAGNOSIS — M549 Dorsalgia, unspecified: Secondary | ICD-10-CM | POA: Diagnosis present

## 2020-05-29 DIAGNOSIS — R441 Visual hallucinations: Secondary | ICD-10-CM | POA: Diagnosis present

## 2020-05-29 DIAGNOSIS — Z881 Allergy status to other antibiotic agents status: Secondary | ICD-10-CM

## 2020-05-29 DIAGNOSIS — Z9152 Personal history of nonsuicidal self-harm: Secondary | ICD-10-CM

## 2020-05-29 DIAGNOSIS — Z833 Family history of diabetes mellitus: Secondary | ICD-10-CM

## 2020-05-29 DIAGNOSIS — F32A Depression, unspecified: Secondary | ICD-10-CM | POA: Insufficient documentation

## 2020-05-29 DIAGNOSIS — M797 Fibromyalgia: Secondary | ICD-10-CM | POA: Diagnosis present

## 2020-05-29 DIAGNOSIS — B182 Chronic viral hepatitis C: Secondary | ICD-10-CM | POA: Diagnosis present

## 2020-05-29 DIAGNOSIS — Z5900 Homelessness unspecified: Secondary | ICD-10-CM

## 2020-05-29 DIAGNOSIS — R440 Auditory hallucinations: Secondary | ICD-10-CM | POA: Diagnosis present

## 2020-05-29 DIAGNOSIS — K5090 Crohn's disease, unspecified, without complications: Secondary | ICD-10-CM | POA: Diagnosis present

## 2020-05-29 DIAGNOSIS — J449 Chronic obstructive pulmonary disease, unspecified: Secondary | ICD-10-CM | POA: Diagnosis present

## 2020-05-29 DIAGNOSIS — F314 Bipolar disorder, current episode depressed, severe, without psychotic features: Principal | ICD-10-CM | POA: Diagnosis present

## 2020-05-29 DIAGNOSIS — F17200 Nicotine dependence, unspecified, uncomplicated: Principal | ICD-10-CM

## 2020-05-29 DIAGNOSIS — Z8719 Personal history of other diseases of the digestive system: Secondary | ICD-10-CM

## 2020-05-29 DIAGNOSIS — E282 Polycystic ovarian syndrome: Secondary | ICD-10-CM | POA: Diagnosis present

## 2020-05-29 DIAGNOSIS — Z86718 Personal history of other venous thrombosis and embolism: Secondary | ICD-10-CM

## 2020-05-29 DIAGNOSIS — Z8249 Family history of ischemic heart disease and other diseases of the circulatory system: Secondary | ICD-10-CM

## 2020-05-29 DIAGNOSIS — Z806 Family history of leukemia: Secondary | ICD-10-CM

## 2020-05-29 DIAGNOSIS — T300 Burn of unspecified body region, unspecified degree: Secondary | ICD-10-CM

## 2020-05-29 DIAGNOSIS — G4700 Insomnia, unspecified: Secondary | ICD-10-CM | POA: Diagnosis present

## 2020-05-29 DIAGNOSIS — Z20822 Contact with and (suspected) exposure to covid-19: Secondary | ICD-10-CM | POA: Insufficient documentation

## 2020-05-29 DIAGNOSIS — Z888 Allergy status to other drugs, medicaments and biological substances status: Secondary | ICD-10-CM

## 2020-05-29 DIAGNOSIS — K219 Gastro-esophageal reflux disease without esophagitis: Secondary | ICD-10-CM | POA: Diagnosis present

## 2020-05-29 DIAGNOSIS — Z91040 Latex allergy status: Secondary | ICD-10-CM

## 2020-05-29 LAB — CBC AND DIFFERENTIAL
Basophils %: 0.6 % (ref 0.0–3.0)
Basophils Absolute: 0.1 10*3/uL (ref 0.0–0.3)
Eosinophils %: 0.4 % (ref 0.0–7.0)
Eosinophils Absolute: 0 10*3/uL (ref 0.0–0.8)
Hematocrit: 44.6 % (ref 36.0–48.0)
Hemoglobin: 14.8 gm/dL (ref 12.0–16.0)
Lymphocytes Absolute: 2 10*3/uL (ref 0.6–5.1)
Lymphocytes: 15.3 % (ref 15.0–46.0)
MCH: 32 pg (ref 28–35)
MCHC: 33 gm/dL (ref 31–36)
MCV: 97 fL (ref 80–100)
MPV: 8.4 fL (ref 6.0–10.0)
Monocytes Absolute: 1 10*3/uL (ref 0.1–1.7)
Monocytes: 7.6 % (ref 3.0–15.0)
Neutrophils %: 76.1 % (ref 42.0–78.0)
Neutrophils Absolute: 9.8 10*3/uL — ABNORMAL HIGH (ref 1.7–8.6)
PLT CT: 311 10*3/uL (ref 130–440)
RBC: 4.59 10*6/uL (ref 3.80–5.00)
RDW: 12.8 % (ref 10.5–14.5)
WBC: 12.8 10*3/uL — ABNORMAL HIGH (ref 4.0–11.0)

## 2020-05-29 LAB — VH URINE DRUG SCREEN
Propoxyphene: NEGATIVE
Urine Amphetamine: NEGATIVE
Urine Barbiturates: NEGATIVE
Urine Benzodiazepines: NEGATIVE
Urine Buprenorphine: NEGATIVE
Urine Cannabinoids: NEGATIVE
Urine Cocaine: NEGATIVE
Urine Methadone Screen: NEGATIVE
Urine Methamphetamine: NEGATIVE
Urine Opiates: NEGATIVE
Urine Oxycodone: NEGATIVE
Urine Phencyclidine: NEGATIVE
Urine Tricyclics: NEGATIVE

## 2020-05-29 LAB — VH URINALYSIS WITH MICROSCOPIC AND CULTURE IF INDICATED
Bilirubin, UA: NEGATIVE mg/dL
Glucose, UA: NEGATIVE mg/dL
Ketones UA: NEGATIVE mg/dL
Leukocyte Esterase, UA: NEGATIVE Leu/uL
Nitrite, UA: POSITIVE — AB
Protein, UR: NEGATIVE mg/dL
Urine Specific Gravity: 1.01 (ref 1.001–1.040)
Urobilinogen, UA: 1 mg/dL — AB
WBC, UA: NONE SEEN /hpf
pH, Urine: 7.5 pH (ref 5.0–8.0)

## 2020-05-29 LAB — ECG 12-LEAD
Patient Age: 34 years
Q-T Interval(Corrected): 423 ms
Q-T Interval: 442 ms
Ventricular Rate: 55 //min

## 2020-05-29 LAB — COMPREHENSIVE METABOLIC PANEL
ALT: 16 U/L (ref 0–55)
AST (SGOT): 16 U/L (ref 10–42)
Albumin/Globulin Ratio: 1.17 Ratio (ref 0.80–2.00)
Albumin: 4.1 gm/dL (ref 3.5–5.0)
Alkaline Phosphatase: 93 U/L (ref 40–145)
Anion Gap: 15.7 mMol/L (ref 7.0–18.0)
BUN / Creatinine Ratio: 10.5 Ratio (ref 10.0–30.0)
BUN: 8 mg/dL (ref 7–22)
Bilirubin, Total: 0.6 mg/dL (ref 0.1–1.2)
CO2: 20 mMol/L (ref 20.0–30.0)
Calcium: 9.1 mg/dL (ref 8.5–10.5)
Chloride: 108 mMol/L (ref 98–110)
Creatinine: 0.76 mg/dL (ref 0.60–1.20)
EGFR: 106 mL/min/{1.73_m2} (ref 60–150)
Globulin: 3.5 gm/dL (ref 2.0–4.0)
Glucose: 100 mg/dL — ABNORMAL HIGH (ref 71–99)
Osmolality Calculated: 276 mOsm/kg (ref 275–300)
Potassium: 4.7 mMol/L (ref 3.5–5.3)
Protein, Total: 7.6 gm/dL (ref 6.0–8.3)
Sodium: 139 mMol/L (ref 136–147)

## 2020-05-29 LAB — VH XPERT XPRESS © SARS-COV-2 PLUS
Does patient have symptoms related to condition of interest?: NEGATIVE
Does patient reside in a congregate care setting?: NEGATIVE
Is patient employed in a healthcare setting?: NEGATIVE
Is the patient hospitalized because of this condition?: NEGATIVE
SARS-CoV-2 RNA: NEGATIVE

## 2020-05-29 LAB — URINE HCG QUALITATIVE: Urine HCG Qualitative: NEGATIVE

## 2020-05-29 LAB — TSH: TSH: 0.13 u[IU]/mL — ABNORMAL LOW (ref 0.40–4.20)

## 2020-05-29 LAB — SALICYLATE LEVEL: Salicylate Level: <5 mg/dL (ref 0.0–30.0)

## 2020-05-29 LAB — ETHANOL: Alcohol: <10 mg/dL (ref 0–9)

## 2020-05-29 LAB — ACETAMINOPHEN LEVEL: Acetaminophen Level: 5.4 ug/mL (ref 0.0–30.0)

## 2020-05-29 MED ORDER — LORAZEPAM 1 MG PO TABS
1.0000 mg | ORAL_TABLET | ORAL | Status: DC | PRN
Start: 2020-05-29 — End: 2020-05-29
  Administered 2020-05-29: 1 mg via ORAL

## 2020-05-29 MED ORDER — RIVAROXABAN 20 MG PO TABS
20.0000 mg | ORAL_TABLET | Freq: Every day | ORAL | Status: DC
Start: 2020-05-30 — End: 2020-05-31
  Administered 2020-05-30 – 2020-05-31 (×2): 20 mg via ORAL
  Filled 2020-05-29 (×2): qty 1

## 2020-05-29 MED ORDER — VH DIPHENHYDRAMINE HCL 50 MG/ML IJ SOLN WITH NO DEFAULT PRN REASON
50.0000 mg | INTRAMUSCULAR | Status: DC | PRN
Start: 2020-05-29 — End: 2020-05-31

## 2020-05-29 MED ORDER — DIPHENOXYLATE-ATROPINE 2.5-0.025 MG PO TABS
2.0000 | ORAL_TABLET | Freq: Four times a day (QID) | ORAL | Status: DC | PRN
Start: 2020-05-29 — End: 2020-05-30
  Administered 2020-05-30: 2 via ORAL
  Filled 2020-05-29 (×2): qty 2

## 2020-05-29 MED ORDER — DIPHENHYDRAMINE HCL 50 MG/ML IJ SOLN
6.2500 mg | Freq: Once | INTRAMUSCULAR | Status: AC
Start: 2020-05-29 — End: 2020-05-29
  Administered 2020-05-29: 6.25 mg via INTRAMUSCULAR

## 2020-05-29 MED ORDER — HYDROXYZINE HCL 25 MG PO TABS
25.0000 mg | ORAL_TABLET | Freq: Four times a day (QID) | ORAL | Status: DC | PRN
Start: 2020-05-29 — End: 2020-05-30
  Administered 2020-05-29 – 2020-05-30 (×2): 25 mg via ORAL
  Filled 2020-05-29 (×2): qty 1

## 2020-05-29 MED ORDER — LORAZEPAM 1 MG PO TABS
ORAL_TABLET | ORAL | Status: AC
Start: 2020-05-29 — End: ?
  Filled 2020-05-29: qty 1

## 2020-05-29 MED ORDER — NICOTINE 21 MG/24HR TD PT24
1.0000 | MEDICATED_PATCH | Freq: Every day | TRANSDERMAL | Status: DC
Start: 2020-05-30 — End: 2020-06-03
  Administered 2020-05-30: 1 via TRANSDERMAL
  Filled 2020-05-29 (×5): qty 1

## 2020-05-29 MED ORDER — DIPHENHYDRAMINE HCL 50 MG/ML IJ SOLN
INTRAMUSCULAR | Status: AC
Start: 2020-05-29 — End: ?
  Filled 2020-05-29: qty 1

## 2020-05-29 MED ORDER — HALOPERIDOL LACTATE 5 MG/ML IJ SOLN
5.0000 mg | Freq: Once | INTRAMUSCULAR | Status: AC
Start: 2020-05-29 — End: 2020-05-29
  Administered 2020-05-29: 5 mg via INTRAMUSCULAR

## 2020-05-29 MED ORDER — SILVER SULFADIAZINE 1 % EX CREA
TOPICAL_CREAM | CUTANEOUS | Status: AC
Start: 2020-05-29 — End: ?
  Filled 2020-05-29: qty 50

## 2020-05-29 MED ORDER — HALOPERIDOL 5 MG PO TABS
5.0000 mg | ORAL_TABLET | ORAL | Status: DC | PRN
Start: 2020-05-29 — End: 2020-05-31
  Administered 2020-05-30 – 2020-05-31 (×4): 5 mg via ORAL
  Filled 2020-05-29 (×4): qty 1

## 2020-05-29 MED ORDER — SILVER SULFADIAZINE 1 % EX CREA
TOPICAL_CREAM | Freq: Once | CUTANEOUS | Status: AC
Start: 2020-05-29 — End: 2020-05-29

## 2020-05-29 MED ORDER — ACETAMINOPHEN 325 MG PO TABS
650.0000 mg | ORAL_TABLET | Freq: Four times a day (QID) | ORAL | Status: DC | PRN
Start: 2020-05-29 — End: 2020-05-30
  Administered 2020-05-30: 650 mg via ORAL
  Filled 2020-05-29: qty 2

## 2020-05-29 MED ORDER — ACETAMINOPHEN 325 MG PO TABS
ORAL_TABLET | ORAL | Status: AC
Start: 2020-05-29 — End: ?
  Filled 2020-05-29: qty 2

## 2020-05-29 MED ORDER — HALOPERIDOL LACTATE 5 MG/ML IJ SOLN
5.0000 mg | INTRAMUSCULAR | Status: DC | PRN
Start: 2020-05-29 — End: 2020-05-31

## 2020-05-29 MED ORDER — LORAZEPAM 1 MG PO TABS
1.0000 mg | ORAL_TABLET | ORAL | Status: DC | PRN
Start: 2020-05-29 — End: 2020-05-31
  Administered 2020-05-30 – 2020-05-31 (×4): 1 mg via ORAL
  Filled 2020-05-29 (×4): qty 1

## 2020-05-29 MED ORDER — LORAZEPAM 1 MG PO TABS
1.0000 mg | ORAL_TABLET | Freq: Once | ORAL | Status: AC
Start: 2020-05-29 — End: 2020-05-29
  Administered 2020-05-29: 1 mg via ORAL

## 2020-05-29 MED ORDER — TRAZODONE HCL 50 MG PO TABS
50.0000 mg | ORAL_TABLET | Freq: Every evening | ORAL | Status: DC | PRN
Start: 2020-05-29 — End: 2020-05-30
  Administered 2020-05-29: 50 mg via ORAL
  Filled 2020-05-29: qty 1

## 2020-05-29 MED ORDER — DIPHENHYDRAMINE HCL 25 MG PO CAPS
25.0000 mg | ORAL_CAPSULE | ORAL | Status: DC | PRN
Start: 2020-05-29 — End: 2020-05-31
  Administered 2020-05-30 – 2020-05-31 (×4): 25 mg via ORAL
  Filled 2020-05-29 (×4): qty 1

## 2020-05-29 MED ORDER — FLUTICASONE FUROATE-VILANTEROL 100-25 MCG/INH IN AEPB
1.0000 | INHALATION_SPRAY | Freq: Every morning | RESPIRATORY_TRACT | Status: DC
Start: 2020-05-30 — End: 2020-06-03
  Administered 2020-05-30 – 2020-06-03 (×3): 1 via RESPIRATORY_TRACT
  Filled 2020-05-29: qty 14

## 2020-05-29 MED ORDER — HALOPERIDOL LACTATE 5 MG/ML IJ SOLN
INTRAMUSCULAR | Status: AC
Start: 2020-05-29 — End: ?
  Filled 2020-05-29: qty 1

## 2020-05-29 MED ORDER — ACETAMINOPHEN 325 MG PO TABS
650.0000 mg | ORAL_TABLET | Freq: Once | ORAL | Status: AC
Start: 2020-05-29 — End: 2020-05-29
  Administered 2020-05-29: 650 mg via ORAL

## 2020-05-29 MED ORDER — VH LORAZEPAM 2 MG/ML IJ SOLN WITH NO DEFAULT PRN REASON
2.0000 mg | INTRAMUSCULAR | Status: DC | PRN
Start: 2020-05-29 — End: 2020-05-31
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
## 2020-05-29 NOTE — ED Notes (Signed)
Pt's belongings are in the med room. Aldean Ast., RN aware.

## 2020-05-29 NOTE — ED Notes (Signed)
Officer Hammer with [facility] at bedside.

## 2020-05-29 NOTE — ED Notes (Signed)
Per Dr. Freida Busman, ok to try right arm for venipuncture despite prior hx of DVT.
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
## 2020-05-29 NOTE — ED Notes (Signed)
Patient accepted to Adult psyc by Dr.Sharma - room 116B at [facility]- please call report to [phone]. Please set up transport
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
## 2020-05-29 NOTE — ED Notes (Signed)
Called Jamie at [facility] and gave her the patients insurances authorization number.
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
## 2020-05-29 NOTE — ED Notes (Signed)
Patient on Polycon with [facility].
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
## 2020-05-29 NOTE — ED Notes (Signed)
Pt discharged via stretcher in care of [facility] for transport to [facility], report has been called to receiving facility, all paperwork completed, distributed, and filed. Pt is alert and calm, in no acute distress.
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
## 2020-05-29 NOTE — ED Notes (Signed)
Pt reported chest pain and anxiety to [facility]. EKG and ativan ordered by ED MD.

## 2020-05-29 NOTE — ED Notes (Signed)
Called patients insurance and spoke with Shirene. Was given conformation number #[id number].

## 2020-05-30 DIAGNOSIS — F32A Depression, unspecified: Secondary | ICD-10-CM

## 2020-05-30 DIAGNOSIS — R45851 Suicidal ideations: Secondary | ICD-10-CM

## 2020-05-30 LAB — CBC AND DIFFERENTIAL
Basophils %: 0.5 % (ref 0.0–3.0)
Basophils Absolute: 0.1 10*3/uL (ref 0.0–0.3)
Eosinophils %: 1.2 % (ref 0.0–7.0)
Eosinophils Absolute: 0.1 10*3/uL (ref 0.0–0.8)
Hematocrit: 41.5 % (ref 36.0–48.0)
Hemoglobin: 13.4 gm/dL (ref 12.0–16.0)
Lymphocytes Absolute: 2.4 10*3/uL (ref 0.6–5.1)
Lymphocytes: 20.4 % (ref 15.0–46.0)
MCH: 32 pg (ref 28–35)
MCHC: 32 gm/dL (ref 32–36)
MCV: 100 fL (ref 80–100)
MPV: 8.7 fL (ref 6.0–10.0)
Monocytes Absolute: 1.1 10*3/uL (ref 0.1–1.7)
Monocytes: 9.3 % (ref 3.0–15.0)
Neutrophils %: 68.6 % (ref 42.0–78.0)
Neutrophils Absolute: 8 10*3/uL (ref 1.7–8.6)
PLT CT: 286 10*3/uL (ref 130–440)
RBC: 4.17 10*6/uL (ref 3.80–5.00)
RDW: 12.5 % (ref 11.0–14.0)
WBC: 11.7 10*3/uL — ABNORMAL HIGH (ref 4.0–11.0)

## 2020-05-30 LAB — THYROID STIMULATING HORMONE (TSH), REFLEX ON ABNORMAL TO FREE T4, SERUM: TSH: 1.4 u[IU]/mL (ref 0.40–4.20)

## 2020-05-30 MED ORDER — DIPHENOXYLATE-ATROPINE 2.5-0.025 MG PO TABS
2.0000 | ORAL_TABLET | ORAL | Status: DC | PRN
Start: 2020-05-30 — End: 2020-06-03
  Administered 2020-05-31 – 2020-06-02 (×2): 2 via ORAL
  Filled 2020-05-30 (×4): qty 2

## 2020-05-30 MED ORDER — SILVER SULFADIAZINE 1 % EX CREA
TOPICAL_CREAM | Freq: Every day | CUTANEOUS | Status: DC
Start: 2020-05-30 — End: 2020-06-03
  Filled 2020-05-30: qty 50

## 2020-05-30 MED ORDER — HYDROXYZINE HCL 50 MG PO TABS
50.0000 mg | ORAL_TABLET | Freq: Four times a day (QID) | ORAL | Status: DC | PRN
Start: 2020-05-30 — End: 2020-06-03
  Administered 2020-05-31 – 2020-06-02 (×3): 50 mg via ORAL
  Filled 2020-05-30 (×3): qty 1

## 2020-05-30 MED ORDER — TOPIRAMATE 25 MG PO TABS
50.0000 mg | ORAL_TABLET | Freq: Two times a day (BID) | ORAL | Status: DC
Start: 2020-05-30 — End: 2020-06-03
  Administered 2020-05-30 – 2020-06-03 (×9): 50 mg via ORAL
  Filled 2020-05-30 (×10): qty 2

## 2020-05-30 MED ORDER — DIVALPROEX SODIUM ER 500 MG PO TB24
750.0000 mg | ORAL_TABLET | Freq: Every evening | ORAL | Status: DC
Start: 2020-05-30 — End: 2020-05-31
  Administered 2020-05-30: 750 mg via ORAL
  Filled 2020-05-30: qty 1

## 2020-05-30 MED ORDER — TIZANIDINE HCL 4 MG PO TABS
4.0000 mg | ORAL_TABLET | Freq: Three times a day (TID) | ORAL | Status: DC | PRN
Start: 2020-05-30 — End: 2020-05-30
  Filled 2020-05-30: qty 1

## 2020-05-30 MED ORDER — TRAZODONE HCL 100 MG PO TABS
200.0000 mg | ORAL_TABLET | Freq: Every evening | ORAL | Status: DC | PRN
Start: 2020-05-30 — End: 2020-06-03
  Administered 2020-05-30 – 2020-06-02 (×4): 200 mg via ORAL
  Filled 2020-05-30 (×4): qty 2

## 2020-05-30 MED ORDER — ACETAMINOPHEN 500 MG PO TABS
500.0000 mg | ORAL_TABLET | ORAL | Status: DC | PRN
Start: 2020-05-30 — End: 2020-06-03
  Administered 2020-05-31 – 2020-06-03 (×10): 500 mg via ORAL
  Filled 2020-05-30 (×10): qty 1

## 2020-05-30 MED ORDER — PROMETHAZINE HCL 25 MG PO TABS
12.5000 mg | ORAL_TABLET | Freq: Four times a day (QID) | ORAL | Status: DC | PRN
Start: 2020-05-30 — End: 2020-06-03
  Administered 2020-05-30 – 2020-06-02 (×3): 12.5 mg via ORAL
  Filled 2020-05-30 (×3): qty 1

## 2020-05-30 MED ORDER — VENLAFAXINE HCL ER 37.5 MG PO CP24
37.5000 mg | ORAL_CAPSULE | Freq: Every morning | ORAL | Status: DC
Start: 2020-05-30 — End: 2020-05-31
  Administered 2020-05-30 – 2020-05-31 (×2): 37.5 mg via ORAL
  Filled 2020-05-30 (×2): qty 1

## 2020-05-30 MED ORDER — PREGABALIN 150 MG PO CAPS
150.0000 mg | ORAL_CAPSULE | Freq: Three times a day (TID) | ORAL | Status: DC
Start: 2020-05-30 — End: 2020-06-03
  Administered 2020-05-30 – 2020-06-03 (×12): 150 mg via ORAL
  Filled 2020-05-30 (×12): qty 1

## 2020-05-30 MED ORDER — CLONAZEPAM 0.5 MG PO TABS
0.5000 mg | ORAL_TABLET | Freq: Every day | ORAL | Status: DC
Start: 2020-05-30 — End: 2020-06-01
  Administered 2020-05-30 – 2020-06-01 (×3): 0.5 mg via ORAL
  Filled 2020-05-30 (×3): qty 1

## 2020-05-30 MED ORDER — TIZANIDINE HCL 2 MG PO TABS
4.0000 mg | ORAL_TABLET | Freq: Three times a day (TID) | ORAL | Status: DC | PRN
Start: 2020-05-30 — End: 2020-06-03
  Administered 2020-05-30 – 2020-06-03 (×7): 4 mg via ORAL
  Filled 2020-05-30 (×7): qty 2

## 2020-05-30 NOTE — Plan of Care (Signed)
Problem: At Risk for Suicide AS EVIDENCED BY: patient reports thoughts of suicide.  Goal: Identify and participate in supportive program therapies  05/30/2020 1633 by Esmeralda Arthur  Outcome: Progressing  Flowsheets (Taken 05/30/2020 1633)  Identify and participate in supportive program therapies: Encourage attendance and reinforce small successes in participation  Note: Daily Group Attendance Note:      OrientationandGoalsGroup:AttendedNo     Specialty Group #1:Attended No     Group Therapy:Attended No     Specialty Group #2 :Attended Yes     Specialty Group  #3:Attended N/A      Summary: Patient elected to rest and observe during the morning and participated more in the afternoon activities. Patient interacted pleasantly with others by for example requesting a stress ball for a peer who was anxious.

## 2020-05-30 NOTE — Plan of Care (Signed)
Problem: At Risk for Suicide AS EVIDENCED BY: patient reports thoughts of suicide.  Goal: Identify and participate in supportive program therapies  Outcome: Not Progressing  Flowsheets (Taken 05/30/2020 1524)  Identify and participate in supportive program therapies: Encourage attendance and reinforce small successes in participation  Note: GROUP THERAPY  NOTE      Time:  1100AM-1150AM  Duration: 50 minutes      LEARNING INTERVENTIONS: Today's topic was rationalizing negative perceptions. Patients were provided material called "Challenging Negative Thoughts" from https://www.jensen-hernandez.com/. They worked on how to change unhelpful thinking styles by implementing DBT therapuetic skills to achieve helpful thinking styles in uncomfortable situations. Patients were provided the opportunity to discuss their challenges regarding this subject.    Addresses paitent goal: emotion regulation, identifying new coping skills, challenging irrational thoughts    COMMENTS:  Patient was offered opportunity to participate in group, and elected to rest. Materials for independent leisure and coping skills made available to patient. Will continue to encourage group participation.

## 2020-05-30 NOTE — Progress Notes (Signed)
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

## 2020-05-30 NOTE — Plan of Care (Signed)
Problem: At Risk for Suicide AS EVIDENCED BY: patient reports thoughts of suicide.  Goal: Will identify long and short term goals based on individual needs and strengths  Outcome: Progressing  Flowsheets (Taken 05/30/2020 0045)  Pt will identify long and short term treatment goals based on individual needs and strengths: Assist to identify goals for treatment based on individual needs and strengths  Goal: Patient will remain safe during hospitalization  Outcome: Progressing  Goal: Suicide Alert Level Moderate  Outcome: Progressing  Flowsheets (Taken 05/30/2020 0045)  Suicide Alert Level Moderate:   Every 15 minute checks   Educate patient to ask for help  Goal: Identifies triggers and protective strategies  Outcome: Progressing  Flowsheets (Taken 05/30/2020 0045)  Identifies triggers and protecive strategies:   Assist to identify safe, positive strategies to cope   Assist to identify triggers to aggressive/violent thoughts   Reinforce patient's efforts to manage anger and aggression  Goal: Verbalizes understanding of medication, benefits, and side effects  Outcome: Progressing  Flowsheets (Taken 05/30/2020 0045)  Verbalizes understanding of medication, benefits, and side effects:   Provide medication teaching including name, dosage, benefits, action, effect and side effects   Encourage to report response to medications including side effects     Problem: Tobacco/Nicotine use/dependence: AS EVIDENCED BY: patient is a smoker.  Goal: Utilizes cessation medication to decrease tobacco/nicotine cravings and/or withdrawal  Outcome: Progressing  Flowsheets (Taken 05/30/2020 0045)  Utilizes cessation medication to decrease tobacco/nicotine cravings and/ or withdrawal:   Assess patient's motivation and educate patient regarding smoking cessation and use of NRT during hospitalization   Inform patient about types and options of available NRT

## 2020-05-30 NOTE — Plan of Care (Signed)
Problem: At Risk for Suicide AS EVIDENCED BY: patient reports thoughts of suicide.  Goal: Identify and participate in supportive program therapies  05/30/2020 1549 by Esmeralda Arthur  Outcome: Progressing  Note: ACTIVITIES/SPECIALTY GROUP NOTE    TIME: 1300  DURATION: 2 hours    Affect/Mood:  Appropriate    Thought Process:  Logical and Goal Directed    Thought Content:  Within Normal Limits    Interpersonal:  Attentive and Provided Feedback    LEARNING INTERVENTIONS:     Games: Patients played Bowling and Greggory Stallion on the [employer] and/or [employer] together while listening to music. Corn Thayer Jew is a physically active game. The patients one at a time toss bean bags to a target with 3 holes each one bigger than the next. This game can be played individually or on teams. Score can be kept. When not playing the games, patients sang and socialized with music playing their song requests. This provided a healthy setting for socialization, increased expression, and being able to reduce stress and have fun.    Addresses patient goal: Coping skills, following structured tasks, social skills, improved affect, cognitive stimulation, organization    Identification of Feelings  /Problem Solving Skills :   Fully Achieved    COPING STRATEGIES: games, music, socialization    COMMENTS: Patient participated actively by making song requests, listening to music, and socializing pleasantly.

## 2020-05-30 NOTE — Plan of Care (Signed)
Problem: At Risk for Suicide AS EVIDENCED BY: patient reports thoughts of suicide.  Goal: Identify and participate in supportive program therapies  Outcome: Progressing  Flowsheets (Taken 05/30/2020 1154)  Identify and participate in supportive program therapies: Encourage attendance and reinforce small successes in participation  Note: ACTIVITIES/SPECIALTY GROUP NOTE    TIME:0900  DURATION:45 minutes      LEARNING INTERVENTIONS:   GOALS GROUP:  Patients are informed of rules and policies of their stay on [facility].  Questions are taken and answered.  The group includes a stated goal by each patient for the day to review at the end of the day in the Nurse Wrap Up Group.    COMMENTS: Patient was offered opportunity to participate in group, and elected to rest. Materials for independent leisure and coping skills made available to patient. Will continue to encourage group participation.
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
## 2020-05-30 NOTE — Plan of Care (Signed)
Patient presents as anxious, restless, and irritable with good eye contact.  Patient is somatic and med focused throughout shift.  Patient is alert and oriented x 4.  Patient is awake and visible on the unit.  Patient denies SI, HI, and AVH.  Pt reported pain, nausea, diarrhea, muscle cramps, agitation, and anxiety.  Pt was given multiple PRN medications for concerns but most were ineffective or barely effective per pt self report.  Patient is selectively social and interactive with peers.  Patient is polite, pleasant and cooperative with staff.   Patient is attending groups and participating appropriately.  All 0700-1900 scheduled, PRN and first dose medications given with no adverse reactions or side effects observed or reported.  No management problem.         Problem: At Risk for Suicide AS EVIDENCED BY: patient reports thoughts of suicide.  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     Problem: At Risk for Suicide AS EVIDENCED BY: patient reports thoughts of suicide.  Goal: Will identify long and short term goals based on individual needs and strengths  Outcome: Progressing  Goal: Patient will remain safe during hospitalization  Outcome: Progressing  Flowsheets (Taken 05/30/2020 2013)  Patient will remain safe during hospitalization:   Assess Suicide Risk each shift using Suicide Assessment Tool (SAT)   Initiate Suicide Alert Level and appropriate interventions based on SAT  Note: All assessments and safety checks completed and patient has remained safe on low level alert.   Goal: Suicide Alert Level Moderate  Outcome: Progressing  Flowsheets (Taken 05/30/2020 2013)  Suicide Alert Level Moderate:   Every 15 minute checks   Reassess patient every shift using Suicide Assessment Tool (SAT)  Note: All  assessments and safety checks completed and patient has remained safe on low level alert.   Goal: Identifies triggers and protective strategies  Outcome: Progressing  Goal: Verbalizes understanding of medication, benefits, and side effects  Outcome: Progressing  Flowsheets (Taken 05/30/2020 2013)  Verbalizes understanding of medication, benefits, and side effects: Encourage to report response to medications including side effects  Note: Pt is somatic and med focused throughout shift.  Patient states they have questions and concerns related to their medications.  No side effects or adverse reactions observed or reported.   Goal: Identify and participate in supportive program therapies  Outcome: Progressing  Flowsheets (Taken 05/30/2020 2013)  Identify and participate in supportive program therapies: Encourage attendance and reinforce small successes in participation  Note: Patient is attending only rare groups but participating appropriately.      Problem: Tobacco/Nicotine use/dependence: AS EVIDENCED BY: patient is a smoker.  Goal: Utilizes cessation medication to decrease tobacco/nicotine cravings and/or withdrawal  Outcome: Progressing  Goal: Completes discharge plan for continued tobacco/nicotine cessation  Outcome: Progressing

## 2020-05-30 NOTE — H&P (Signed)
[facility] ADMISSION NOTE    [name], [name], 35 y.o. female  Date of Birth:  1985/09/29    CC: "I feel like Sh**".     HPI: Patient is a 35 year old female who came to ED with multiple complaints of pain in face and back injuries.She reported to staff she was feeling paranoid in ED about staff.      The Clinical research associate met with patient in unit today.    She was noted to be very irritable and angry.  Gave brief replies.    Reports she has been feeling suicidal since last 4 days .Reports she is facing a lot of stress.  Reports she has Bipolar disorder, IED, anxiety, depression,  Reports she has been off her her meds few months back. Reports she was on klonopin, Effexor, Trazodone, topamax. Reports her meds were not working for her.    Sleep reported as poor  Appetite reported as poor  Reports she has  auditory hallucinations-hears her ex-GF's voices  Reports she has  visual hallucinations-sees shadows  Reports she has mood swings- reports her moods change rapidly, gets very angry very fast  Denies any episode suggestive of a manic episode.  Depression symptoms- sadness, crying episodes, irritability, difficulty to concentrate,  Passive SI,  guilty feelings, anhedonia  Anxiety-"all the time"       -Reports she has passive suicidal ideations with no intent or plan. Feels safe in hospital.  -Patient denies any homicidal ideations    Past Psychiatry History:   Previous Diagnosis:   Inpatient Admissions:   Suicide attemptsSelf-Injury:   OP follow up/medications (compliance):   ECT history:    Substance History:   Recreational drugs: denies  Nicotine: yes, 1pk a day   Use of alcohol: yes, a shot once a month      Past Medical History:   Past Medical History:   Diagnosis Date   . Anxiety    . Bipolar 1 disorder    . Chronic abdominal pain    . Chronic hepatitis C 05/03/2017   . Chronic obstructive pulmonary disease     not on home oxygen   . Crohn's disease 01/05/2016   . 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 Fibromyalgia    . Gastroesophageal reflux disease    . Insomnia    . Opiate abuse, episodic    . Seasonal allergic rhinitis         Allergies:   Fentanyl, Ketorolac tromethamine, Morphine, Vancomycin, Latex, Oxycodone, Oxycodone-acetaminophen, Reglan [metoclopramide], Rocephin [ceftriaxone], Tramadol, and Tree nuts    Current medication list:   Scheduled Meds:  Current Facility-Administered Medications   Medication Dose Route Frequency   . fluticasone furoate-vilanterol  1 puff Inhalation QAM   . nicotine  1 patch Transdermal Daily   . rivaroxaban  20 mg Oral Daily     Continuous Infusions:  PRN Meds:.acetaminophen, LORazepam **AND** haloperidol **AND** diphenhydrAMINE, LORazepam **AND** haloperidol lactate **AND** diphenhydrAMINE, diphenoxylate-atropine, hydrOXYzine, traZODone      Family Psychiatric History:   grandmother and mom- bipolar depression    Social History:   Born in [place]  Childhood reported as "shitty"  Raised by Mother  Educated till IXth grade  Single  Homeless  Currently not working  On [employer]  .  Strengths:   Patient currently unable to list strengths. Will continue to explore.Marland Kitchen  MENTAL STATUS EXAM:                Appearance: appears older than stated age, poor eye contact noted, poor hygiene, no psychomotor retardation and no psychomotor agitation  Mood:  "angry"  Affect:  Irritable/angry  Thought Process:  Perseverating/Circumstantial  Thought Content:  No Homicidal thoughts, intent or plan, No delusional thought content and Passive SI, no plan or intent  Perceptual Disturbance:  Auditory Hallucination and Visual Hallucination  Insight:  Poor  Judgment: Poor    Cognition:  Alert and Oriented to time, place and person   Neuro: speech is fluent, comprehensible, no aphasia noted  and No gait abnormalities noted      ASSESSMENT:   Patient is reporting to [facility] symptomatic for suicidal  ideations and a current mental status exam is remarkable for above.  Patient reports change from previous level of functioning for worse. Patient meets criteria for admission based on these findings.      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 Appreciate pharmacy/intake assistance with verifying home medications.     Medications adjustments:   Retstart effexor Er 37.5 MG Po daily and Toapamax 50 mg PO BID  Start Depakote ER 750 mg PO HS for mood stabilization.  Restart Home meds confirmed from her pharmacy.    Will have PRN medications for agitation, EPS, sleep, anxiety, and mild/moderate pain as per hospital protocol. Suicide precautions started, as per unit policy, with room checks by technicians.     Medical comorbidities:   Full assessment by ED physician, has been medically cleared for [facility] admission. No acute medical issues identified.     Collateral information from friends/family or providers would be helpful. Questions answered regarding substance use, co-morbid risk with mental illness and local resources. Will continue to explore need for substance use consult/SBIRT.     Treatment team meeting will be within 24-72 hours to further r/o underlying personality disorders (if applicable), establish goals for hospitalization, discuss outpatient follow-up and to finalize medication management prior to discharge.     Patient understands and agrees to above treatment plan. Understands and appreciates the benefits versus risks of implementing treatment. Discussed options within medication classes. Estimated length of stay is 5-7 days.     Goals of treatment include: Reduction of presenting symptoms, improving patients level of safety, returning to previous level of functioning, and ensuring behavioral health follow-up upon discharge.    I hereby certify this patient for hospitalization based  upon medical necessity as noted above.    Debra Kuba, MD  05/30/2020 10:33 AM
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
## 2020-05-30 NOTE — Plan of Care (Signed)
Problem: At Risk for Suicide AS EVIDENCED BY: patient reports thoughts of suicide.  Goal: Identify and participate in supportive program therapies  05/30/2020 1229 by Esmeralda Arthur  Outcome: Progressing  Flowsheets (Taken 05/30/2020 1229)  Identify and participate in supportive program therapies: Encourage attendance and reinforce small successes in participation  Note:   ACTIVITIES/SPECIALTY GROUP NOTE    TIME:1000  DURATION: 45 minutes    LEARNING INTERVENTIONS:   Mindfulness: Stretching/Breathing:  This is a mindfulness group that uses stretching, light movement, and deep breath to stimulate presence in the "here and now" and encourage grounding and relaxation. With guided breathing, patients mirror the therapist doing light exercises while sitting and standing. Patients are encouraged to work at their own pace and ability.  Correct body alignment is stressed to do the exercises properly and without strain.  Relaxing music is played during the group. Patients discuss healthy coping skills.     Addresses patient goal: Coping skills, body wellness, following structured tasks, focus, relaxation/self-care      COMMENTS:  Patient was offered opportunity to participate in group, and elected to observe from the periphery. Patient stated just wanting to watch. Materials for independent leisure and coping skills made available to patient. Will continue to encourage group participation.

## 2020-05-31 ENCOUNTER — Inpatient Hospital Stay: Payer: Medicare Managed Care Other

## 2020-05-31 DIAGNOSIS — F314 Bipolar disorder, current episode depressed, severe, without psychotic features: Principal | ICD-10-CM

## 2020-05-31 DIAGNOSIS — F17200 Nicotine dependence, unspecified, uncomplicated: Secondary | ICD-10-CM

## 2020-05-31 DIAGNOSIS — R45851 Suicidal ideations: Secondary | ICD-10-CM

## 2020-05-31 DIAGNOSIS — Z8719 Personal history of other diseases of the digestive system: Secondary | ICD-10-CM

## 2020-05-31 DIAGNOSIS — F319 Bipolar disorder, unspecified: Secondary | ICD-10-CM

## 2020-05-31 LAB — ECG 12-LEAD
P Wave Axis: 16 deg
P-R Interval: 104 ms
QRS Axis: 82 deg
QRS Duration: 90 ms
T Axis: 74 years

## 2020-05-31 MED ORDER — OLANZAPINE 10 MG PO TABS
10.0000 mg | ORAL_TABLET | ORAL | Status: DC | PRN
Start: 2020-05-31 — End: 2020-06-03
  Administered 2020-05-31 – 2020-06-02 (×4): 10 mg via ORAL
  Filled 2020-05-31 (×4): qty 1

## 2020-05-31 MED ORDER — DIVALPROEX SODIUM 500 MG PO TBEC
500.0000 mg | DELAYED_RELEASE_TABLET | Freq: Two times a day (BID) | ORAL | Status: DC
Start: 2020-05-31 — End: 2020-06-03
  Administered 2020-05-31 – 2020-06-03 (×7): 500 mg via ORAL
  Filled 2020-05-31 (×7): qty 1

## 2020-05-31 MED ORDER — STERILE WATER FOR INJECTION IJ SOLN
10.0000 mg | INTRAMUSCULAR | Status: DC | PRN
Start: 2020-05-31 — End: 2020-06-03

## 2020-05-31 MED ORDER — VENLAFAXINE HCL ER 75 MG PO CP24
75.0000 mg | ORAL_CAPSULE | Freq: Every morning | ORAL | Status: DC
Start: 2020-06-01 — End: 2020-06-01
  Administered 2020-06-01: 75 mg via ORAL
  Filled 2020-05-31: qty 1
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
## 2020-05-31 NOTE — Progress Notes (Signed)
Assumed care this morning, introduced self per AIDET guidelines. Patient denies SI/HI/AVH . Visible on unit . Complaints of back ache received tylenol and Zanaflex prn. A*&O*3.

## 2020-05-31 NOTE — Plan of Care (Signed)
Problem: At Risk for Suicide AS EVIDENCED BY: patient reports thoughts of suicide.  Goal: Identify and participate in supportive program therapies  05/31/2020 1701 by Esmeralda Arthur  Outcome: Progressing  Flowsheets (Taken 05/31/2020 1701)  Identify and participate in supportive program therapies: Encourage attendance and reinforce small successes in participation  Note: Daily Group Attendance Note:      OrientationandGoalsGroup:AttendedNo     Specialty Group #1:Attended Yes     Group Therapy:Attended No     Specialty Group #2 :Attended Yes     Specialty Group  #3:Attended Yes      Summary: Patient participated actively in three groups and socialized with others.
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
## 2020-05-31 NOTE — Treatment Plan (Signed)
Collaborative Rounding and Therapist Daily Progress Note    05/31/2020    Participants:  Patient:  [name]  Attending Physician: Bishnu P. Joseph Art, MD  Mental Health Therapist: Pricilla Loveless. Minna Antis, MS, LPC  Pharm:  Geoffry Paradise, PharmD    Summary of Patient Progress on Treatment Plan Goals:  Notes reflect Patient is cooperative.  Patient presents as restless, depressed, irritable, and anxious.  Patient has been present in the dayroom, and social with others.  Patient has been attending groups. Patient provided psychosocial, psychiatric, and medical history. She also provided information on events that occurred just prior to her [facility] admission. Symptoms and safety were assessed.  SI/HI/AVH were denied.  Medications were reviewed. Discharge planning was discussed. Patient is established with [facility].      Affect/Mood:  Anxious, Blunted and Depressed; Restless/Fidgety    Thought Process:  Logical and Goal Directed    Thought Content:  Within Normal Limits    Interpersonal:  Discussed Issues, Attentive and Provided Feedback    Level of Patient Involvement:  Actively engaged/contributing    Intervention Used and Patient Response: Patient is receptive to support and information provided by the treatment team.  Patient was encouraged to continue to attend groups.

## 2020-05-31 NOTE — Plan of Care (Signed)
Patient did not attend 1100 group therapy May 31, 2020.  Patient was invited to attend group, and instead remained in room.     Missed Topic:  Patients had an open format to discuss therapeutic topics.  Patients discussed good sleep hygiene habits.  They also brought up grief/loss issues.  Patients discussed their healthy and unhealthy coping skills, and offered encouragement to each other.  Patients read though and discussed a handout with suggestions for Ways to Reduce Stress.     Alternative Activity:  Patients are provided with therapeutic resource material to review independently (or individually with staff).      Problem: At Risk for Suicide AS EVIDENCED BY: patient reports thoughts of suicide.  Goal: Identify and participate in supportive program therapies  Outcome: Not Progressing

## 2020-05-31 NOTE — Plan of Care (Signed)
Problem: At Risk for Suicide AS EVIDENCED BY: patient reports thoughts of suicide.  Goal: Identify and participate in supportive program therapies  05/31/2020 1157 by Esmeralda Arthur  Outcome: Progressing  Flowsheets (Taken 05/31/2020 1157)  Identify and participate in supportive program therapies: Encourage attendance and reinforce small successes in participation  Note: ACTIVITIES/SPECIALTY GROUP NOTE    TIME: 1000  DURATION: 45 minutes    Affect/Mood:  Appropriate    Thought Process:  Logical and Goal Directed    Thought Content:  Within Normal Limits    Interpersonal:  Attentive and Provided Feedback    LEARNING INTERVENTIONS:   Mindfulness: Stretching/Breathing:  This is a mindfulness group that uses stretching, light movement, and deep breath to stimulate presence in the "here and now" and encourage grounding and relaxation. With guided breathing, patients mirror the therapist doing light exercises while sitting and standing. Patients are encouraged to work at their own pace and ability.  Correct body alignment is stressed to do the exercises properly and without strain.  Relaxing music is played during the group. Patients discuss healthy coping skills.     Addresses patient goal: Coping skills, body wellness, following structured tasks, focus, relaxation/self-care    Identification of Feelings  /Problem Solving Skills :   Fully Achieved    COPING STRATEGIES: stretching/breathing exercises, discussion, music listening      COMMENTS:  Patient participated actively until being called to meet with the doctor. Patient interacted pleasantly and cooperatively with others.

## 2020-05-31 NOTE — Plan of Care (Signed)
Problem: At Risk for Suicide AS EVIDENCED BY: patient reports thoughts of suicide.  Goal: Identify and participate in supportive program therapies  Outcome: Progressing  Flowsheets (Taken 05/31/2020 1146)  Identify and participate in supportive program therapies: Encourage attendance and reinforce small successes in participation  Note: ACTIVITIES/SPECIALTY GROUP NOTE    TIME:0900  DURATION: 20 minutes    LEARNING INTERVENTIONS:   GOALS GROUP:  Patients are informed of rules and policies of their stay on [facility].  Questions are taken and answered.  The group includes a stated goal by each patient for the day to review at the end of the day in the Nurse Wrap Up Group.      COMMENTS: Patient was offered opportunity to participate in group, and elected to rest. Materials for independent leisure and coping skills made available to patient. Will continue to encourage group participation.

## 2020-05-31 NOTE — Plan of Care (Signed)
Problem: At Risk for Suicide AS EVIDENCED BY: patient reports thoughts of suicide.  Goal: Identify and participate in supportive program therapies  05/31/2020 1624 by Esmeralda Arthur  Outcome: Progressing  Flowsheets (Taken 05/31/2020 1624)  Identify and participate in supportive program therapies: Encourage attendance and reinforce small successes in participation  Note:   ACTIVITIES/SPECIALTY GROUP NOTE     TIME: 1300  DURATION: 1 hour 10 minutes     Affect/Mood:  Appropriate     Thought Process:  Logical and Goal Directed     Thought Content:  Within Normal Limits     Interpersonal:  Provided Feedback     LEARNING INTERVENTIONS:      Creative Writing: Music and Emotions: Patients begin the group with a check-in that is related to the emotions they are feeling in that moment and asked to share a song with the group that has been helpful to them in some way. Each patient shares why they selected the song before each song is played. After each song, patients choose a line from their chosen song that stands out to them the most and it is then written onto the white board to create a group poem. At the end of listening to all of the songs, the patients have a single poem that they all wrote together. Relevant topics to their life struggles often come up and are discussed together. This group works on self-expression, coping skills, emotional regulation, group process, listening skills, and social skills.     Addresses patient goal: Coping skills, socialization, self expression     Identification of Feelings  /Problem Solving Skills :   Fully Achieved     COPING STRATEGIES: music, poetry, discussion     COMMENTS: Patient participated actively and contributed to the group discussion and poem. The group poem was about one love and peace for all.

## 2020-05-31 NOTE — Plan of Care (Signed)
Problem: At Risk for Suicide AS EVIDENCED BY: patient reports thoughts of suicide.  Goal: Identify and participate in supportive program therapies  05/31/2020 1639 by Esmeralda Arthur  Outcome: Progressing  Flowsheets (Taken 05/31/2020 1639)  Identify and participate in supportive program therapies: Encourage attendance and reinforce small successes in participation  Note:   ACTIVITIES/SPECIALTY GROUP NOTE     TIME: 1445  DURATION: 60 minutes     Affect/Mood:  Appropriate     Thought Process:  Logical and Goal Directed     Thought Content:  Within Normal Limits     Interpersonal:  Attentive and Provided Feedback     LEARNING INTERVENTIONS:      Art-Making: Editor, commissioning Painting: Patients were provided with paper, watercolor paint, brushes, and crayons and were encouraged to explore the materials and themselves through creating art. Patients were encouraged to explore the spontaneous, in-the-moment process of creating something that did not previously exist. Patients listened to uplifting music while painting. This activity encourages creativity, self-exploration, self-expression, and coping skills.     Address patient goal: self-expression, emotional regulation, coping skills, building confidence      Identification of Feelings  /Problem Solving Skills :   Fully Achieved     COPING STRATEGIES: Economist, art-making, music listening, discussion     COMMENTS: Patient participated actively and painted a picture with swirled colors on two thirds of the page and the last third blank. Patient entitled it, "I never finish anything." Patient socialized pleasantly with others and frequently asked for song requests.

## 2020-05-31 NOTE — Consults (Signed)
Nutrition Therapy  Nutrition Assessment    Patient Information:     Name:[name]   Age: 35 y.o.   Sex: female     MRN: [id number]    Recommendation:     1. Continue regular diet  2. Add ensure strawberry BID with breakfast and dinner  3. Add thrive vanilla once with lunch    Nutrition History:   Screen for Consult  Admitted with SI. PMHx of bipolar disorder, anxiety, insomnia, and Crohn's disease. Poor weight hx with mostly stated weights so unable to trend weight loss. Pt reports her appetite is poor and she barely eats. Unable to finish assessment because of safety issue, all patients were asked to go to their room. However, was able to see that pt appears nourished. Will add an ONS and follow up per policy.   Per meals sheets, she eats 100% meals.    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  PSH:  has a past surgical history that includes Cholecystectomy; APPENDECTOMY (OPEN); LAPAROTOMY, COLECTOMY, TOTAL (2011); Ileostomy (2011); Ileostomy closure (2012); Double Barrel Enterostomy (2013); Enterostomy reversal (2014); SALPINGECTOMY, OPEN (Right); Ovarian cyst surgery (2011-2016); and EGD, COLONOSCOPY  (N/A, 08/19/2019).     Height: 1.651 m (5\' 5" )   Weight: 63 kg (139 lb)   BMI: Body mass index is 23.13 kg/m.   IBW: 57 kg  Weight Monitoring Weight Weight Method   03/09/2019 60.419 kg Standing Scale   03/11/2019 60.328 kg Stated   03/11/2019 60.328 kg Stated   05/24/2019 58.4 kg Bed Scale   10/27/2019 64.411 kg Stated   01/27/2020 64.6 kg Stated   02/11/2020 67.586 kg Stated   05/29/2020 63.05 kg Stated     Pertinent Meds: Lyrica, Xarelto, Ativan, Atarax, Haldol.   Pertinent Labs:  Recent Labs   Lab 05/29/20  1645   Sodium 139   Potassium 4.7   Chloride 108   CO2 20.0   BUN 8   Creatinine 0.76   Glucose 100*   Calcium 9.1   Osmolality Calculated 276          Diet Order:  Orders Placed This Encounter   Procedures   . Diet regular        GI symptoms:    Unsure about last BM.   Skin:        Food Security Issues:  N/A      Learning Needs:  No education needs at this time    Estimated Needs:  Estimated Energy Needs  Total Energy Estimated Needs: 1575-1890kcal  Method for Estimating Needs: 25-30kcal/kg CBW (63kg)  Estimated Protein Needs  Total Protein Estimated Needs: 76g  Method for Estimating Needs: 1.2g/kg CBW (63kg)  Additional CommentsAndrena Mews, RD  05/31/2020 12:26 PM
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
## 2020-05-31 NOTE — Progress Notes (Signed)
Patient did not attend 1100 group therapy May 31, 2020.  Patient was invited to attend group, and instead remained in room.     Missed Topic:  Patients had an open format to discuss therapeutic topics.  Patients discussed good sleep hygiene habits.  They also brought up grief/loss issues.  Patients discussed their healthy and unhealthy coping skills, and offered encouragement to each other.  Patients read though and discussed a handout with suggestions for Ways to Reduce Stress.     Alternative Activity:  Patients are provided with therapeutic resource material to review independently (or individually with staff).

## 2020-06-01 LAB — ECG 12-LEAD
P Wave Axis: 7 deg
P-R Interval: 104 ms
Patient Age: 34 years
Q-T Interval(Corrected): 418 ms
Q-T Interval: 437 ms
QRS Axis: 77 deg
QRS Duration: 91 ms
T Axis: 70 years
Ventricular Rate: 55 //min

## 2020-06-01 LAB — VH CULTURE, URINE

## 2020-06-01 MED ORDER — VENLAFAXINE HCL ER 75 MG PO CP24
112.5000 mg | ORAL_CAPSULE | Freq: Every morning | ORAL | Status: DC
Start: 2020-06-02 — End: 2020-06-02
  Administered 2020-06-02: 112.5 mg via ORAL
  Filled 2020-06-01: qty 1

## 2020-06-01 MED ORDER — CLONAZEPAM 0.5 MG PO TABS
0.2500 mg | ORAL_TABLET | Freq: Every day | ORAL | Status: DC
Start: 2020-06-02 — End: 2020-06-03
  Administered 2020-06-02 – 2020-06-03 (×2): 0.25 mg via ORAL
  Filled 2020-06-01 (×2): qty 1
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
## 2020-06-01 NOTE — Progress Notes (Signed)
BEHAVIORAL HEALTH Progress Note- [facility]   Patient Name: [name], [name]   Date of Birth January 17, 1986                                    LOS: 3 days   Attending Physician: Meyer Russel, MD   Primary Care Physician: Kathi Simpers, NP     Diagnosis / Plan:   [name], 35 y.o. female  Diagnosis:  Principal Problem:    Likely bipolar affective disorder, current episode severe depressed  Active Problems:    Suicidal ideations    History of Crohn's disease    Tobacco use disorder  Back pain      Progress during hospital stay:   Patient had recent medication changes, tolerating but remains symptomatic, will continue with plan and follow.     Psychotropic medications and indications:   Mood problems/irritability/safety concerns:  keep Depakote DR 500 mg every 12 hours for now.  Check Depakote level on 06/03/2020.  Increase Effexor XR 112.5 mg every morning.  Nicotine replacement therapy.  Decrease clonazepam 0.25 mg p.o. daily for now.  Continue Lyrica as before.  Continue Topamax 50 mg every 12 hours as before.  Encourage participation in groups and other therapeutic milieu activities.    Medical concerns:  Appreciate hospitalist consult service input regarding her medical conditions including chronic pain.      Barriers to discharge:   Remains symptomatic, requiring further psychiatric stabilization.     Continue to encourage group attendance and participation. Continue monitoring for side effects. Supportive therapy as needed with focus on psychopharmacology and coping skill improvement. Risks and benefits for recommended therapy discussed.     Patient verbally acknowledges an understanding of the aforementioned treatment plan and agrees.   Subjective   Subjective:   "Depressed..".   She was alert and oriented times at the time of evaluation.  Has been visible in the milieu.  States that she has been talking with her mother who lives in [place] and eventually plans to go  back to [place].  Denies active suicidal ideation also ideation.  During the interaction, sounds anxious, depressed and negatively preoccupied.     Symptoms progress: Depressed, anxious, impulsive  SE: Patient denies medication side effects.   Substance use: Education provided for substance abuse.  Safety: Recent suicidal ideation  Social/Situational updates: Unchanged    Legal Status: VOL  RN: Reviewed chart, medications, progress notes, recent labs, vital signs and discussed with charge nurse.       Objective   Objective:   MENTAL STATUS EXAM:   Apprearance: appears stated age and on and off eye contact   Speech: spontaneous  Mood: "Depressed"  Affect: Anxious appearing  Thought Process:  Perseverating/Circumstantial and Disorganized patterns  Thought Content:  No Homicidal thoughts, intent or plan, recent passive suicidal ideation, Paranoid belief structure, Preoccupation noted and Cognitive distortions; non fixed, but prominent  Perceptual Disturbance: Denies auditory, tacile, olfactory, visual hallucination at this time  Insight/Judgment: Poor but accepting medications  Cognition: Alert and Oriented to time, place and person    EPS: speech is fluent, comprehensible, no aphasia noted     Medications:     Current Facility-Administered Medications   Medication Dose Route Frequency   . [START ON 06/02/2020] clonazePAM  0.25 mg Oral Daily at 0900   . divalproex EC/DR tablet  500 mg Oral Q12H SCH   . fluticasone  furoate-vilanterol  1 puff Inhalation QAM   . nicotine  1 patch Transdermal Daily   . pregabalin  150 mg Oral TID   . silver sulfADIAZINE   Topical Daily   . topiramate  50 mg Oral Q12H SCH   . [START ON 06/02/2020] venlafaxine  112.5 mg Oral QAM W/BREAKFAST       Nutrition assessment done in collaboration with Registered Dietitians:       Note: This note was dictated using Tax adviser.  There may be unintentional variation in spelling or word substitution during transcription.  Please  contact this provider for any questions/clarifications.      Leyana Whidden Smiley Houseman, MD  06/01/2020 4:02 PM

## 2020-06-01 NOTE — UM Notes (Signed)
[facility] Utilization Management Review Sheet    Facility :  [facility]    NAME: [name]  MR#: [id number]  DOB: 09-06-1985  CSN#: [id number]  ROOM: 116/116-B AGE: 35 y.o.    ADMIT DATE AND TIME: 05/29/2020 11:06 PM  PATIENT CLASS: Inpatient Psych/Behavioral Health 05/29/20 2311    ATTENDING PHYSICIAN: Meyer Russel, MD    PAYOR:Payor: MEDICARE [employer] / Plan: [place] [employer] / Product Type: MANAGED MEDICARE /     DIAGNOSIS: Depression with suicidal ideation  HISTORY:   Past Medical History:   Diagnosis Date   . Anxiety    . Bipolar 1 disorder    . Chronic abdominal pain    . Chronic hepatitis C 05/03/2017   . Chronic obstructive pulmonary disease     not on home oxygen   . Crohn's disease 01/05/2016   . 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 Fibromyalgia    . Gastroesophageal reflux disease    . Insomnia    . Opiate abuse, episodic    . Seasonal allergic rhinitis        DATE OF REVIEW: 05/29/2020    PER H&P, PATIENT PRESENTS TO ED, REPORTED FEELING PARANOID TO STAFF. NOTED TO BE IRRITABLE AND ANGRY. ALSO REPORTS FEELING SUICIDAL FOR 4 DAYS    SUICIDAL IDEATIONS WITH NO INTENT OR PLAN    ADMIT TO BH UNIT FOR SAFETY & STABILIZATION ON VOLUNTARY BASIS.     PATIENT IS HOMELESS AND NOT CURRENTLY WORKING    Medication Dose Route Frequency   . fluticasone furoate-vilanterol  1 puff Inhalation QAM   . nicotine  1 patch Transdermal Daily   . rivaroxaban  20 mg Oral Daily     PRN Meds:.acetaminophen, LORazepam **AND** haloperidol **AND** diphenhydrAMINE, LORazepam **AND** haloperidol lactate **AND** diphenhydrAMINE, diphenoxylate-atropine, hydrOXYzine, traZODone    Trevor Iha, RN, BSN  Utilization Management  [facility]  [address]  [place], [place] [address]  Work: [phone]  Fax: [phone]  [email or web address]

## 2020-06-01 NOTE — Plan of Care (Signed)
Collaborative Rounding and Therapist Daily Progress Note    06/01/2020    Participants:  Patient:  [name]  Attending Physician: Bishnu P. Joseph Art, MD  Mental Health Therapist: Pricilla Loveless. Minna Antis, MS, LPC    Summary of Patient Progress on Treatment Plan Goals:  Notes reflect Patient is cooperative.  Patient had complaints of anxiety and bodyaches.  Patient has been present in the dayroom, and social with others. Patient has been attending groups.  Patient reports she feels "better." She has talked to her mother, who lives in [place] and Patient plans to eventually join her. Patient asked about local shelters, and also said she has a friend Armed forces technical officer) with whom she can stay.  Symptoms and safety were assessed.  SI/HI/AVH were denied.  Medications were reviewed.  Discharge planning was discussed. Patient is established with [facility].  Plan to discharge Thursday 06/03/20.     Affect/Mood:  Anxious and Depressed    Thought Process:  Logical and Goal Directed    Thought Content:  Within Normal Limits    Interpersonal:  Discussed Issues, Attentive and Provided Feedback    Level of Patient Involvement:  Actively engaged/contributing    Intervention Used and Patient Response: Patient is receptive to support and information provided by the treatment team.  Patient was encouraged to continue to attend groups.      Problem: At Risk for Suicide AS EVIDENCED BY: patient reports thoughts of suicide.  Goal: Will identify long and short term goals based on individual needs and strengths  Outcome: Progressing
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
## 2020-06-01 NOTE — Plan of Care (Signed)
Problem: At Risk for Suicide AS EVIDENCED BY: patient reports thoughts of suicide.  Goal: Identify and participate in supportive program therapies  06/01/2020 1203 by Esmeralda Arthur  Outcome: Progressing  Flowsheets (Taken 06/01/2020 1203)  Identify and participate in supportive program therapies: Encourage attendance and reinforce small successes in participation  Note: ACTIVITIES/SPECIALTY GROUP NOTE    TIME: 1000  DURATION: 45 minutes    Affect/Mood:  Appropriate    Thought Process:  Logical and Goal Directed    Thought Content:  Within Normal Limits    Interpersonal:  Attentive and Provided Feedback    LEARNING INTERVENTIONS:   Mindfulness: Stretching/Breathing:  This is a mindfulness group that uses stretching, light movement, and deep breath to stimulate presence in the "here and now" and encourage grounding and relaxation. With guided breathing, patients mirror the therapist doing light exercises while sitting and standing. Patients are encouraged to work at their own pace and ability.  Correct body alignment is stressed to do the exercises properly and without strain.  Relaxing music is played during the group. Patients discuss healthy coping skills.     Addresses patient goal: Coping skills, body wellness, following structured tasks, focus, relaxation/self-care    Identification of Feelings  /Problem Solving Skills :   Fully Achieved    COPING STRATEGIES: stretching/breathing exercises, discussion, music listening      COMMENTS:  Patient participated actively and mirrored the stretching and breathing exercises.

## 2020-06-01 NOTE — Plan of Care (Signed)
Problem: At Risk for Suicide AS EVIDENCED BY: patient reports thoughts of suicide.  Goal: Identify and participate in supportive program therapies  06/01/2020 1657 by Esmeralda Arthur  Outcome: Progressing  Flowsheets (Taken 06/01/2020 1657)  Identify and participate in supportive program therapies: Encourage attendance and reinforce small successes in participation  Note:   ACTIVITIES/SPECIALTY GROUP NOTE     TIME: 1445  DURATION: 60 minutes     Affect/Mood:  Appropriate     Thought Process:  Logical and Goal Directed     Thought Content:  Within Normal Limits     Interpersonal:  Attentive and Provided Feedback     LEARNING INTERVENTIONS:      Art-Making: Editor, commissioning Painting: Patients were provided with paper, watercolor paint, brushes, and crayons and were encouraged to explore the materials and themselves through creating art. Patients were encouraged to explore the spontaneous, in-the-moment process of creating something that did not previously exist. Patients listened to uplifting music while painting. This activity encourages creativity, self-exploration, self-expression, and coping skills.     Address patient goal: self-expression, emotional regulation, coping skills, building confidence      Identification of Feelings  /Problem Solving Skills :   Partially Achieved     COPING STRATEGIES: Economist, art-making, music listening, discussion     COMMENTS: Patient participated by socializing pleasantly and making a song request. Patient enjoyed observing others as they painted and admiring their work. Patient stated that her favorite part of the day was groups where they listen to music.

## 2020-06-01 NOTE — Plan of Care (Signed)
Problem: At Risk for Suicide AS EVIDENCED BY: patient reports thoughts of suicide.  Goal: Identify and participate in supportive program therapies  06/01/2020 1627 by Esmeralda Arthur  Outcome: Progressing  Flowsheets (Taken 06/01/2020 1627)  Identify and participate in supportive program therapies: Encourage attendance and reinforce small successes in participation  Note: ACTIVITIES/SPECIALTY GROUP NOTE    TIME: 1305  DURATION: 55 minutes    Affect/Mood:  Blunted    Thought Process:  Logical     Thought Content:  Within Normal Limits    Interpersonal:  Limited    LEARNING INTERVENTIONS:     CHAPLAIN'S GROUP: Spirituality:  This is run by the [employer]. Patients received a handout with the serenity prayer at the top and were encouraged to identify things that are within their power to change and things that are not. This group allows for open discussion for all beliefs and does not center on just one system.    Addresses patient goal: self-exploration, spiritual and interpersonal wellness/fulfillment, coping skills, social skills    Identification of Feelings  /Problem Solving Skills :   None Achieved    COPING STRATEGIES: Spirituality    COMMENTS: Patient sat on the periphery and appeared to be sleeping at times. Patient accepted a handout but did not appear to be following along. Patient asked this therapist about song requests as soon as the group ended.

## 2020-06-01 NOTE — Plan of Care (Signed)
Problem: At Risk for Suicide AS EVIDENCED BY: patient reports thoughts of suicide.  Goal: Identify and participate in supportive program therapies  Outcome: Progressing  Flowsheets (Taken 06/01/2020 1139)  Identify and participate in supportive program therapies: Encourage attendance and reinforce small successes in participation  Note: ACTIVITIES/SPECIALTY GROUP NOTE    TIME: 0900  DURATION: 40 minutes    Affect/Mood:  Appropriate    Thought Process:  Logical and Goal Directed    Thought Content:  Within Normal Limits    Interpersonal:  Attentive and Provided Feedback    LEARNING INTERVENTIONS:   GOALS GROUP:  Patients are informed of rules and policies of their stay on [facility].  Questions are taken and answered.  The group includes a stated goal by each patient for the day to review at the end of the day in the Nurse Wrap Up Group.    Identification of Feelings  /Problem Solving Skills :   Fully Achieved    COPING STRATEGIES: positive goal-setting, utilizing resources      COMMENTS: Patient's stated goal for the day: "To sit and stay awake."

## 2020-06-01 NOTE — Plan of Care (Signed)
Problem: At Risk for Suicide AS EVIDENCED BY: patient reports thoughts of suicide.  Goal: Identify and participate in supportive program therapies  Outcome: Progressing  Flowsheets (Taken 06/01/2020 1310)  Identify and participate in supportive program therapies: Encourage attendance and reinforce small successes in participation  Note: GROUP THERAPY  NOTE      Time:  1100AM-1150AM  Duration: 50 minutes    Affect/Mood:  Appropriate and Negative thinking    Thought Process:  Logical    Thought Content:  Within Normal Limits and responding to internal stimuli    Interpersonal:  Attentive     LEARNING INTERVENTIONS: Patients had an open format to discuss therapeutic topics.  Patients discussed about missing their family members while being IP. The patients supported each other with learning new ways to cope with not being with their families by focusing on self-care and the importance of implementing healthier changes. The therapist provided the group DBT material regarding distress tolerance strategies. The focus was on self-soothing skills and coping skills regarding ways on how to self-soothe were introduced.    Addresses patient goal:  Implement coping skills for stressful events.       Identification of Feelings  /Problem Solving Skills :    Partially Achieved    Coping Stategies:  Partially Achieved    Comments:  The patient was attentive during group. When asked how the patients were feeling, the patient reported, "I am damn freakin-tastic!"

## 2020-06-01 NOTE — Plan of Care (Signed)
Problem: At Risk for Suicide AS EVIDENCED BY: patient reports thoughts of suicide.  Goal: Will identify long and short term goals based on individual needs and strengths  Outcome: Progressing  Goal: Patient will remain safe during hospitalization  Outcome: Progressing  Goal: Suicide Alert Level Moderate  Outcome: Progressing  Flowsheets (Taken 06/01/2020 0610)  Suicide Alert Level Moderate:  . Every 15 minute checks  . Educate patient to ask for help  . Reassess patient every shift using Suicide Assessment Tool (SAT)  Goal: Identifies triggers and protective strategies  Outcome: Progressing  Goal: Verbalizes understanding of medication, benefits, and side effects  Outcome: Progressing  Goal: Identify and participate in supportive program therapies  Outcome: Progressing     Problem: Tobacco/Nicotine use/dependence: AS EVIDENCED BY: patient is a smoker.  Goal: Utilizes cessation medication to decrease tobacco/nicotine cravings and/or withdrawal  Outcome: Progressing  Goal: Completes discharge plan for continued tobacco/nicotine cessation  Outcome: Progressing       Pt visible on the unit, isolative to self. Pt denies SI, HI, AVH this shift. Pt reports increased anxiety and this nurse encouraged pt to used coping skills and emotional support was provided. Pt reports trouble falling and staying asleep, received Trazodone PRN at 2111, effective per staff observation. Total sleep was 8.5 hours.  All 1900 - 0700 medications given with no reported or observed side effects or adverse reactions including first doses given.

## 2020-06-01 NOTE — Plan of Care (Signed)
Problem: At Risk for Suicide AS EVIDENCED BY: patient reports thoughts of suicide.  Goal: Identify and participate in supportive program therapies  06/01/2020 1717 by Esmeralda Arthur  Outcome: Progressing  Flowsheets (Taken 06/01/2020 1717)  Identify and participate in supportive program therapies: Encourage attendance and reinforce small successes in participation  Note: Daily Group Attendance Note:      OrientationandGoalsGroup:AttendedYes     Specialty Group #1:Attended Yes     Group Therapy:Attended Yes     Specialty Group #2 :Attended Yes     Specialty Group  #3:Attended Yes      Summary: Patient participated in all groups and socialized pleasantly with peers.

## 2020-06-02 MED ORDER — VENLAFAXINE HCL ER 75 MG PO CP24
150.0000 mg | ORAL_CAPSULE | Freq: Every morning | ORAL | Status: DC
Start: 2020-06-03 — End: 2020-06-03
  Administered 2020-06-03: 150 mg via ORAL
  Filled 2020-06-02: qty 2

## 2020-06-02 NOTE — Plan of Care (Signed)
Problem: At Risk for Suicide AS EVIDENCED BY: patient reports thoughts of suicide.  Goal: Identify and participate in supportive program therapies  06/02/2020 1719 by Esmeralda Arthur  Outcome: Progressing  Flowsheets (Taken 06/02/2020 1719)  Identify and participate in supportive program therapies: Encourage attendance and reinforce small successes in participation  Note: Daily Group Attendance Note:      OrientationandGoalsGroup:AttendedYes     Specialty Group #1:Attended Yes     Group Therapy:Attended Yes     Specialty Group #2 :Attended Yes     Specialty Group  #3:Attended Yes      Summary: Patient attended all groups and fluctuated between being an active to passive participant. Patient left groups at times due to irritability and was overheard yelling and cursing at staff. Patient socialized with peers throughout the day and was at times distracting to group activities. Listening to music and her favorite songs helps her to feel calm.

## 2020-06-02 NOTE — Plan of Care (Signed)
Problem: At Risk for Suicide AS EVIDENCED BY: patient reports thoughts of suicide.  Goal: Identify and participate in supportive program therapies  Outcome: Progressing  Flowsheets (Taken 06/02/2020 1105)  Identify and participate in supportive program therapies: Encourage attendance and reinforce small successes in participation  Note: ACTIVITIES/SPECIALTY GROUP NOTE    TIME: 0900  DURATION: 15 minutes    Affect/Mood:  Appropriate    Thought Process:  Logical and Goal Directed    Thought Content:  Within Normal Limits    Interpersonal:  Attentive and Provided Feedback    LEARNING INTERVENTIONS:   GOALS GROUP:  Patients are informed of rules and policies of their stay on [facility].  Questions are taken and answered.  The group includes a stated goal by each patient for the day to review at the end of the day in the Nurse Wrap Up Group.    Identification of Feelings  /Problem Solving Skills :   Fully Achieved    COPING STRATEGIES: positive goal-setting, utilizing resources      COMMENTS: Patient's stated goal for the day: "To eat less peanut butter and not hoard forks."

## 2020-06-02 NOTE — Progress Notes (Signed)
Patient continues with numerous requests. Seeking staff every 30 minutes. This RN spoke with patient and told her she could come once a hour with requests. Patient became defensive and states "well other people are coming". Told patient every 30 minutes is excessive. If she is having a crisis or feeling unsafe she was to come immediately, otherwise once a hour. Patient states "oh so I'm a problem. That's fine". Patient blaming and argumentative. This RN ended conversation

## 2020-06-02 NOTE — Plan of Care (Signed)
Patient has been visible on the unit. Attending some groups with little or no participation. Blaming. Denies SI/HI/AVH. Appears happy and social with peers on the unit, but endorses anxiety. Was medicated with atarax effectively. Complaints of severe anxiety after crisis on unit, medicated with zyprexa effectively. Complaints of pain, medicated with tylenol effectively. Complaints muscle spasms medicated with zanaflex effectively. Complaints of nausea medicated effectively with phenerghan.    All 7a-7p meds given including all first doses with no adverse reactions noted.        Problem: At Risk for Suicide AS EVIDENCED BY: patient reports thoughts of suicide.  Goal: Patient will remain safe during hospitalization  Outcome: Progressing  Goal: Identifies triggers and protective strategies  Outcome: Progressing

## 2020-06-02 NOTE — Progress Notes (Signed)
Dressing to mid-upper back removed per patient request. Wound is wet in appearance with white-grey coating. Told patient dressing should be re-applied. Patient refused dressing

## 2020-06-02 NOTE — Plan of Care (Signed)
Problem: At Risk for Suicide AS EVIDENCED BY: patient reports thoughts of suicide.  Goal: Identify and participate in supportive program therapies  06/02/2020 1127 by Esmeralda Arthur  Outcome: Progressing  Flowsheets (Taken 06/02/2020 1127)  Identify and participate in supportive program therapies: Encourage attendance and reinforce small successes in participation  Note: ACTIVITIES/SPECIALTY GROUP NOTE    TIME: 1000  DURATION:30 minutes    Affect/Mood:  Appropriate    Thought Process:  Logical and Goal Directed    Thought Content:  Within Normal Limits    Interpersonal:  Provided Feedback and Disruptive    LEARNING INTERVENTIONS:   Mindfulness: Stretching/Breathing:  This is a mindfulness group that uses stretching, light movement, and deep breath to stimulate presence in the "here and now" and encourage grounding and relaxation. With guided breathing, patients mirror the therapist doing light exercises while sitting and standing. Patients are encouraged to work at their own pace and ability.  Correct body alignment is stressed to do the exercises properly and without strain.  Relaxing music is played during the group. Patients discuss healthy coping skills.     Addresses patient goal: Coping skills, body wellness, following structured tasks, focus, relaxation/self-care    Identification of Feelings  /Problem Solving Skills :   Partially Achieved    COPING STRATEGIES: stretching/breathing exercises, discussion, music listening      COMMENTS:  Patient participated actively, but was disruptive due to having frequent side conversations. Patient was talking to peers while doing select exercises and bouncing a stress ball.

## 2020-06-02 NOTE — Progress Notes (Signed)
After crisis on unit, patient anxious and scared. Was medicated with Zyprexa by another RN. Then observed sitting comfortably on patio socializing, smiling and laughing with peers

## 2020-06-02 NOTE — Plan of Care (Signed)
Interdisciplinary Treatment Plan Meeting    06/02/2020    Participants:  Patient: [name]   Attending Physician:  Meyer Russel, MD  RN: Barnabas Harries, RN-BC  Mental Health Therapist: Pricilla Loveless. Minna Antis, MS, LPC  DCP: Carolin Sicks, BSW, Discharge Planner  Pharm:  Geoffry Paradise, PharmD & 2 students    Objective:  Review response to treatment, reassess needs/goals, update plan as indicated incorporating patient's strengths and stated needs, goals, and preferences.    Comments: PHQ9, which was administered on the day of admission was reviewed, which patient scored a total of 27.    1. Summary of Patient Progress on Treatment Plan Goals:  Notes reflect Patient was cooperative.  Patient presents as somatic, restless, irritable, argumentative, and anxious.  Patient complains of nightmares and anxiety.  Patient is present in the dayroom and social with others.  Patient is attending groups.  Patient reports she feels anxious due to a disruption that took place on the unit.  Medications were reviewed.  Discharge planning was discussed.  Patient is established with [facility].She has a friend with whom she can stay.  Plan to discharge tomorrow.    Treatment Plan Acknowledgement was reviewed and signed.  (Patient reported she was unable to review and sign.)     2. Level of Patient Involvement:  Actively engaged/contributing as evidenced by Patient's level of participation and effort with treatment (including medication compliance and group attendance), and efforts towards a safe discharge plan.     3. Patient Understanding of Plan of Care:  Able to verbalize goals and interventions, Able to verbalize discharge criteria    4. Level of Agreement/Commitment to Plan of Care:  Agrees with and is committed to plan of care    Contributor Signatures:  (Participants sign a printed copy which is scanned into the EMR.)     Problem: At Risk for Suicide AS EVIDENCED BY: patient reports thoughts of  suicide.  Goal: Will identify long and short term goals based on individual needs and strengths  Outcome: Progressing

## 2020-06-02 NOTE — Progress Notes (Addendum)
Patient awake and asking for medications this am. Told patient not to focus on medications and the time she can have them. Told patient to use coping skills and distraction techniques. Patient agreeable but not happy with response. Complaining of nightmares. Told her to speak with doctor in rounds this morning.  Denies SI/Hi/AVH.

## 2020-06-02 NOTE — Progress Notes (Signed)
BEHAVIORAL HEALTH Progress Note- [facility]   Patient Name: [name], [name]   Date of Birth 1985/05/23                                    LOS: 4 days   Attending Physician: Meyer Russel, MD   Primary Care Physician: Kathi Simpers, NP     Diagnosis / Plan:   [name], 35 y.o. female  Diagnosis:  Principal Problem:    Likely bipolar affective disorder, current episode severe depressed  Active Problems:    Suicidal ideations    History of Crohn's disease    Tobacco use disorder  Back pain      Progress during hospital stay:   Patient had recent medication changes, tolerating but remains symptomatic, will continue with plan and follow.     Psychotropic medications and indications:   Mood problems/irritability/safety concerns:  Continue Depakote DR 500 mg every  12 hours for now.  Check Depakote level on 06/03/2020 as planned before  Increase Effexor XR 150 mg every morning.  Nicotine replacement therapy.  Keep clonazepam 0.25 mg p.o. daily for now with plan to discontinue  Continue Lyrica as before.  Continue Topamax 50 mg every 12 hours as before.  Encourage participation in groups and other therapeutic milieu activities.    Medical concerns:  Appreciate hospitalist consult service input regarding her medical conditions including chronic pain.      Barriers to discharge:   Remains symptomatic, requiring further psychiatric stabilization.     Continue to encourage group attendance and participation. Continue monitoring for side effects. Supportive therapy as needed with focus on psychopharmacology and coping skill improvement. Risks and benefits for recommended therapy discussed.     Patient verbally acknowledges an understanding of the aforementioned treatment plan and agrees.   Subjective   Subjective:   "Anxious".   She was alert and oriented x3 at the time of evaluation.  Patient was visibly anxious and distressed earlier in the morning and stated that it was  situational and triggered by seeing another peer who was becoming aggressive and agitated.  Patient was approached later in the day for evaluation as she was severely distressed and unable to participate in interaction in the morning.  During that evaluation, she was more calmer.  Stated that she had talked with a friend and apparently there will be able to stay with a friend for some time before going to [place] to be with her mom.    Denies suicidal ideation/homicidal ideation or other respiratory delusions.  However appears negatively preoccupied and impulsive..     Symptoms progress: Continues to appear depressed, anxious, impulsive  SE: Patient denies medication side effects.   Substance use: Education provided for substance abuse.  Safety: Denies suicidal ideation/homicidal ideation.  He appears impulsive.  Social/Situational updates: Unchanged    Legal Status: VOL  RN: Reviewed chart, medications, progress notes, recent labs, vital signs and discussed with charge nurse.       Objective   Objective:   MENTAL STATUS EXAM:   Apprearance: appears stated age and on and off eye contact, cooperative  Speech: spontaneous, normal rate and rhythm  Mood: "Anxious.  Depressed"  Affect: Anxious appearing  Thought Process: Mostly circumstantial.  Thought Content:  No Homicidal thoughts, intent or plan, denies passive suicidal ideation, Paranoid belief structure, Preoccupation noted and Cognitive distortions; non fixed, but prominent  Perceptual Disturbance:  Denies auditory, tacile, olfactory, visual hallucination at this time  Insight/Judgment: Limited but improving and accepting medications  Cognition: Alert and Oriented to time, place and person    EPS: speech is fluent, comprehensible, no aphasia noted     Medications:     Current Facility-Administered Medications   Medication Dose Route Frequency   . clonazePAM  0.25 mg Oral Daily at 0900   . divalproex EC/DR tablet  500 mg Oral Q12H SCH   . fluticasone  furoate-vilanterol  1 puff Inhalation QAM   . nicotine  1 patch Transdermal Daily   . pregabalin  150 mg Oral TID   . silver sulfADIAZINE   Topical Daily   . topiramate  50 mg Oral Q12H SCH   . venlafaxine  112.5 mg Oral QAM W/BREAKFAST       Nutrition assessment done in collaboration with Registered Dietitians:       Note: This note was dictated using Tax adviser.  There may be unintentional variation in spelling or word substitution during transcription.  Please contact this provider for any questions/clarifications.      Katlyne Nishida Smiley Houseman, MD  06/02/2020 1:47 PM

## 2020-06-02 NOTE — Plan of Care (Signed)
Problem: At Risk for Suicide AS EVIDENCED BY: patient reports thoughts of suicide.  Goal: Identify and participate in supportive program therapies  06/02/2020 1641 by Esmeralda Arthur  Outcome: Progressing  Flowsheets (Taken 06/02/2020 1638)  Identify and participate in supportive program therapies: Encourage attendance and reinforce small successes in participation  Note: ACTIVITIES/SPECIALTY GROUP NOTE    TIME: 1445  DURATION: 60 minutes    Affect/Mood:  Irritable, anxious    Thought Process:  Perseverative    Thought Content:  Within Normal Limits    Interpersonal:  Overheard yelling at staff, Provided Feedback    LEARNING INTERVENTIONS:     Art-Making: Tissue Paper and Positive Affirmations Collage: Patients were provided with tissues paper squares, glue, scissors, paper, and a list of positive affirmations. Patients were encouraged to make a collage centered on the positive affirmation(s) they chose to include in their picture. This activity encourages creativity, self-exploration, self-expression, and coping skills.    Address patient goal: self-expression, emotional regulation, coping skills, building confidence and positive-thinking    Identification of Feelings  /Problem Solving Skills :   Partially Achieved    COPING STRATEGIES: art-making, music listening, socialization, positive affirmations    COMMENTS: Patient attempted to start some art and stopped after gluing down a few pieces of paper. Patient observed briefly, listened to music, and socialized. Patient was pacing intermittently and left group. Patient was overheard outside of group yelling and cursing at nursing staff. Patient returned to group as group was cleaning up materials and was pacing the room. This therapist offered patient the time to listen to her two favorite songs. Patient accepted, listened to the songs, and then started to help with cleaning up the group materials. Patient expressed appreciation for the activities.

## 2020-06-02 NOTE — Plan of Care (Signed)
Patient did not attend 1100 group therapy Jun 02, 2020.  Patient was invited to attend group. She was present for introductions and said she was, "Pissed off." Sheleft group and was out on the patio, where she was yelling.       Missed Topic: Patients opened the discussion by talking about how to cope with PTSD symptoms.  Suggestions were made about utilizing various coping skills and grounding techniques, talking to a therapist, and working with a psychiatrist.  A CBT Coping Skills handout was read through and discussed.  Patients were encouraged to provider personal examples.     Alternative Activity:  Patients are provided with therapeutic resource material to review independently (or individually with staff).      Problem: At Risk for Suicide AS EVIDENCED BY: patient reports thoughts of suicide.  Goal: Identify and participate in supportive program therapies  Outcome: Not Progressing

## 2020-06-02 NOTE — Progress Notes (Signed)
Patient observed laughing, joking and smiling with peers on the unit. Then came to this RN with complaints of nausea with unknown source. Medicated with phenerghan

## 2020-06-02 NOTE — Progress Notes (Signed)
[facility] DEPARTMENT OF PHARMACY  Psychiatric Pharmacy Patient Medication Education Group    Model: psychoeducation group  Duration of group: 60 minutes (start: 1300, stop: 1400)  Number of patients in attendance: 14    Patient attended the entire pharmacist-led medication education group.    Individual behavior  . Participation level: variable  . Participation behavior: attentive  . Rapport: distant    . During group, patient did not ask any questions.    Psychoeducation topics  The following topics were reviewed in addition to specific patient questions:  . Indications, time to onset, goals of treatment, and expected duration of treatment for antidepressants, antipsychotics, mood stabilizers, and anxiolytics (benzodiazepines, hydroxyzine)  . Importance of discussing side effects with providers, especially if considering self-discontinuing a medication due to the severity of side effects  . Difference between sedation as a side effect and as a sign of recovery (e.g. if not sleeping well prior to admission)  . Managing weight gain  . Non-pharmacologic approaches for anxiety and depression (exercise, healthy diet), the importance of practicing these skills, and the benefits when used with or without medications    Geoffry Paradise, PharmD, [facility]  Psychiatric Pharmacist  06/02/2020   4:38 PM

## 2020-06-02 NOTE — Plan of Care (Signed)
Problem: Discharge Barriers  Goal: Patient will be discharged home or other facility with appropriate resources  Outcome: Adequate for Discharge   Per next care providers request, History & Physical, Labwork, and FaceSheet were faxed to [facility].
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
## 2020-06-02 NOTE — Progress Notes (Signed)
Patient came to window and spoke with another RN about medications and was directed to me. Patient yelling and screaming on unit. This RN checked orders prior to speaking with patient. Then spoke with patient on patio. Patient began accusing staff of "giving me dirty looks". Accusing staff of not listening to and helping her. Told patient this RN is her nurse and therefore all questions, concerns and medication administration will be provided by this RN. Patient cursing and yelling. Patient given a warning if she did not refrain the conversation would be ended. Patient asked when " is my klonopin due". Told patient her MD decreased klonopin to once daily. Patient began cursing and screaming. This RN told patient when she could speak with respect we would resume conversation. This RN left patio.

## 2020-06-03 LAB — VALPROIC ACID LEVEL, TOTAL: Valproic Acid Level: 86 ug/mL (ref 50–100)

## 2020-06-03 MED ORDER — TRAZODONE HCL 100 MG PO TABS
200.0000 mg | ORAL_TABLET | Freq: Every evening | ORAL | 0 refills | Status: AC | PRN
Start: 2020-06-03 — End: ?

## 2020-06-03 MED ORDER — NICOTINE 21 MG/24HR TD PT24
1.0000 | MEDICATED_PATCH | Freq: Every day | TRANSDERMAL | 0 refills | Status: AC
Start: 2020-06-04 — End: 2020-07-04

## 2020-06-03 MED ORDER — VENLAFAXINE HCL ER 150 MG PO CP24
150.0000 mg | ORAL_CAPSULE | Freq: Every morning | ORAL | 0 refills | Status: AC
Start: 2020-06-04 — End: 2020-07-04

## 2020-06-03 MED ORDER — SILVER SULFADIAZINE 1 % EX CREA
TOPICAL_CREAM | Freq: Two times a day (BID) | CUTANEOUS | 0 refills | Status: AC
Start: 2020-06-03 — End: 2020-07-03

## 2020-06-03 MED ORDER — DIVALPROEX SODIUM 500 MG PO TBEC
500.0000 mg | DELAYED_RELEASE_TABLET | Freq: Two times a day (BID) | ORAL | 0 refills | Status: AC
Start: 2020-06-03 — End: 2020-07-03

## 2020-06-03 NOTE — Discharge Summary -  Nursing (Signed)
Discharge instructions and follow up appointments given to patient. Instructed patient to go to pharmacy to pick up medications. All questions answered, understanding verbalized. Patient states she is ready for discharge. Denies SI/HI/AVH. Patient requesting to walk to store to wait for her ride home. To be ambulated off unit and provide with directions to [facility]

## 2020-06-03 NOTE — Plan of Care (Signed)
Pt alert and oriented x 4.   Pt denies suicidal ideation, homicidal ideation, auditory hallucinations or visual hallucinations.   Pt isolates to self. Pt cooperative, pleasant.   Pt does not attend groups or participates.  Pt isolates to room.   Pt is in a depressed mood with blunted affect.   Pt took PRN trazodone and Zyprexa for agitation and sleep. Medication effective staff observed pt sleeping.  Pt 1900-0700, PRN and first dose medications administered without observed or reported side effects or adverse reactions.     Problem: At Risk for Suicide AS EVIDENCED BY: patient reports thoughts of suicide.  Goal: Patient will remain safe during hospitalization  Outcome: Progressing  Flowsheets (Taken 06/02/2020 0141 by Patrici Ranks, RN)  Patient will remain safe during hospitalization: Assess suicide risk using Tool for Assessment of Suicide Risk (TASR) (admission, prn, discharge)  Goal: Suicide Alert Level Moderate  Outcome: Progressing  Flowsheets (Taken 06/02/2020 0141 by Patrici Ranks, RN)  Suicide Alert Level Moderate:   Every 15 minute checks   Educate patient to ask for help   Reassess patient every shift using Suicide Assessment Tool (SAT)     Problem: Tobacco/Nicotine use/dependence: AS EVIDENCED BY: patient is a smoker.  Goal: Utilizes cessation medication to decrease tobacco/nicotine cravings and/or withdrawal  Outcome: Progressing  Flowsheets (Taken 06/01/2020 1514 by Guard, [place], RN)  Utilizes cessation medication to decrease tobacco/nicotine cravings and/ or withdrawal: Inform patient about types and options of available NRT

## 2020-06-03 NOTE — Plan of Care (Signed)
Problem: At Risk for Suicide AS EVIDENCED BY: patient reports thoughts of suicide.  Goal: Will identify long and short term goals based on individual needs and strengths  Outcome: Adequate for Discharge  Goal: Patient will remain safe during hospitalization  Outcome: Adequate for Discharge  Goal: Suicide Alert Level Moderate  Outcome: Adequate for Discharge  Goal: Identifies triggers and protective strategies  Outcome: Adequate for Discharge  Goal: Verbalizes understanding of medication, benefits, and side effects  Outcome: Adequate for Discharge  Goal: Identify and participate in supportive program therapies  Outcome: Adequate for Discharge  Goal: Completes discharge safety and recovery plan  Outcome: Adequate for Discharge     Problem: Tobacco/Nicotine use/dependence: AS EVIDENCED BY: patient is a smoker.  Goal: Utilizes cessation medication to decrease tobacco/nicotine cravings and/or withdrawal  Outcome: Adequate for Discharge  Goal: Completes discharge plan for continued tobacco/nicotine cessation  Outcome: Adequate for Discharge     Problem: Discharge Barriers  Goal: Patient will be discharged home or other facility with appropriate resources  Outcome: Adequate for Discharge

## 2020-06-03 NOTE — Progress Notes (Signed)
Discharge Summaries and After Visit Summaries have been faxed on 06/03/2020 at 1713 to the next level of care with [facility].

## 2020-06-03 NOTE — Progress Notes (Signed)
At 2100 pt dressing to mid-upper back removed per patient request. Wound edges were inflamed and red. Requested that wound dressing should be re-applied. Patient refused dressing. No discharge on dressing.     At 0600 pt requested writer to assess wound. Wound edges looked pink and less inflamed.
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
## 2020-06-04 ENCOUNTER — Emergency Department
Admission: EM | Admit: 2020-06-04 | Discharge: 2020-06-05 | Payer: Medicare Managed Care Other | Attending: Emergency Medicine | Admitting: Emergency Medicine

## 2020-06-04 DIAGNOSIS — R55 Syncope and collapse: Secondary | ICD-10-CM | POA: Insufficient documentation

## 2020-06-04 DIAGNOSIS — T40601A Poisoning by unspecified narcotics, accidental (unintentional), initial encounter: Secondary | ICD-10-CM | POA: Insufficient documentation

## 2020-06-04 NOTE — ED Notes (Signed)
Aggie Cosier recommends observation for 6 hours post narcan. Drugs and narcan given around 2100.
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
## 2020-06-04 NOTE — EDIE (Signed)
COLLECTIVE?NOTIFICATION?06/04/2020 22:21?[name], [name]?MRN: [id number]    [facility]'s patient encounter information:   VWU:?[id number]  Account [id number]  Billing Account [id number]      Criteria Met      History of Suicide Ideation or Self-Harm (12 mo.)    Security and Safety  No recent Security Events currently on file    ED Care Guidelines  There are currently no ED Care Guidelines for this patient. Please check your facility's medical records system.    Flags      Negative COVID-19 Lab Result - [facility] - A specimen collected from this patient was negative for COVID-19 / Attributed By: [place] Department of Health / Attributed On: 05/30/2020       Prescription Monitoring Program  570??- Narcotic Use Score  731??- Sedative Use Score  000??- Stimulant Use Score  540??- Overdose Risk Score  - All Scores range from 000-999 with 75% of the population scoring < 200 and on 1% scoring above 650  - The last digit of the narcotic, sedative, and stimulant score indicates the number of active prescriptions of that type  - Higher Use scores correlate with increased prescribers, pharmacies, mg equiv, and overlapping prescriptions  - Higher Overdose Risk Scores correlate with increased risk of unintentional overdose death   Concerning or unexpectedly high scores should prompt a review of the PMP record; this does not constitute checking PMP for prescribing purposes.      E.D. Visit Count (12 mo.)  Facility Visits   [facility] 13   Total 13   Note: Visits indicate total known visits.     Recent Emergency Department Visit Summary  Showing 10 most recent visits out of 13 in the past 12 months  Date Facility [facility] Type Diagnoses or Chief Complaint   Jun 04, 2020 [name] [facility]      [facility]      May 29, 2020 [name] [facility]      psych eval      Psychiatric Evaluation      Depression, unspecified      Suicidal  ideations      May 27, 2020 [name] [place] [facility]      Insect Bite      Skin Ulcer      Puncture wound without foreign body, right foot, initial encounter      Disorder of the skin and subcutaneous tissue, unspecified      Feb 11, 2020 [name] [facility]      low bp/weak/ bodyaches      Hypotension      Dehydration      COVID-19      Acute kidney failure, unspecified      Urinary tract infection, site not specified      Jan 27, 2020 [name] [place] [facility]      weakness      Weakness      COVID-19      Nov 28, 2019 [name] [facility]      low blood pressure, trouble urinating, cough, weakness      Dizziness      Hypotension      Pneumonia, unspecified organism      Oct 27, 2019 [name] [place] [facility]      SOB/COVID SYMPTOMS  Arm Pain      Headache      Fever      Sore Throat      Malingerer [conscious simulation]      Procedure and treatment not carried out due to patient leaving prior to being seen by health care provider      Acute embolism and thrombosis of deep veins of right upper extremity      Oct 16, 2019 [name] [place] [facility]      chest pain      Arm Pain      Fatigue      Shortness of Breath      Pain in right arm      Other chest pain      Oct 13, 2019 [name] [facility]      neck pain      Arm Pain      Deep Venous Thrombosis      Patient's noncompliance with other medical treatment and regimen      Oct 10, 2019 [name] [place] [facility]      Arm pain      Malingerer [conscious simulation]      Opioid abuse, in remission      Pain in right arm          Recent Inpatient Visit Summary  No recorded inpatient visits.     Care Team  Provider Specialty Phone Fax Service Dates   Adline Mango, MD Family Medicine [phone] Jan 24, 2019 - Current      Collective Portal  This patient has registered at the [facility] Emergency Department   For more information visit: [email or web address]     PLEASE NOTE:     1.   Any care recommendations and other clinical information are provided as guidelines or for historical purposes only, and providers should exercise their own clinical judgment when providing care.    2.   You may only use this information for purposes of treatment, payment or health care operations activities, and subject to the limitations of applicable Collective Policies.    3.   You should consult directly with the organization that provided a care guideline or other clinical history with any questions about additional information or accuracy or completeness of information provided.    ? 2022 [place] - [email or web address]

## 2020-06-15 ENCOUNTER — Emergency Department
Admission: EM | Admit: 2020-06-15 | Discharge: 2020-06-15 | Disposition: A | Discharge status: Home / Self Care | Payer: Medicare Managed Care Other | Attending: Internal Medicine | Admitting: Internal Medicine

## 2020-06-15 DIAGNOSIS — Z20822 Contact with and (suspected) exposure to covid-19: Secondary | ICD-10-CM | POA: Insufficient documentation

## 2020-06-15 DIAGNOSIS — R062 Wheezing: Secondary | ICD-10-CM | POA: Insufficient documentation

## 2020-06-15 DIAGNOSIS — R070 Pain in throat: Secondary | ICD-10-CM | POA: Insufficient documentation

## 2020-06-15 DIAGNOSIS — R0602 Shortness of breath: Secondary | ICD-10-CM | POA: Insufficient documentation

## 2020-06-15 DIAGNOSIS — J209 Acute bronchitis, unspecified: Secondary | ICD-10-CM | POA: Insufficient documentation

## 2020-06-15 LAB — VH XPERT XPRESS © COV-2/FLU/RSV PLUS
Date of Onset: 20220519
Does patient reside in a congregate care setting?: NEGATIVE
Influenza A RNA: NEGATIVE
Influenza B RNA: NEGATIVE
Is patient employed in a healthcare setting?: NEGATIVE
RSV RNA: NEGATIVE
SARS-CoV-2 RNA: NEGATIVE

## 2020-06-15 MED ORDER — PREDNISONE 20 MG PO TABS
60.0000 mg | ORAL_TABLET | Freq: Every day | ORAL | 0 refills | Status: AC
Start: 2020-06-15 — End: 2020-06-20

## 2020-06-15 NOTE — ED Provider Notes (Signed)
I have examined this patient and have reviewed the notes, assessments and/or procedures performed by Roel Cluck. I concur with her documentation of this patient--with these modifications made directly to the note.     Osa Craver, MD  06/15/20 1440
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
## 2020-06-15 NOTE — ED Notes (Signed)
Pt verbalized understanding of all discharge instructions and education.
